# Patient Record
Sex: Male | Born: 1935 | Race: White | Hispanic: No | Smoking: Former smoker
Health system: Southern US, Community
[De-identification: ages and names within clinical notes are randomized; demographics above are authoritative.]

## PROBLEM LIST (undated history)

## (undated) DIAGNOSIS — K222 Esophageal obstruction: Secondary | ICD-10-CM

## (undated) DIAGNOSIS — R42 Dizziness and giddiness: Secondary | ICD-10-CM

## (undated) DIAGNOSIS — I10 Essential (primary) hypertension: Secondary | ICD-10-CM

## (undated) DIAGNOSIS — M1990 Unspecified osteoarthritis, unspecified site: Secondary | ICD-10-CM

## (undated) DIAGNOSIS — E669 Obesity, unspecified: Secondary | ICD-10-CM

## (undated) DIAGNOSIS — K259 Gastric ulcer, unspecified as acute or chronic, without hemorrhage or perforation: Secondary | ICD-10-CM

## (undated) DIAGNOSIS — M109 Gout, unspecified: Secondary | ICD-10-CM

## (undated) DIAGNOSIS — I350 Nonrheumatic aortic (valve) stenosis: Secondary | ICD-10-CM

## (undated) DIAGNOSIS — K5790 Diverticulosis of intestine, part unspecified, without perforation or abscess without bleeding: Secondary | ICD-10-CM

## (undated) DIAGNOSIS — Z9989 Dependence on other enabling machines and devices: Secondary | ICD-10-CM

## (undated) DIAGNOSIS — E119 Type 2 diabetes mellitus without complications: Secondary | ICD-10-CM

## (undated) DIAGNOSIS — Z9289 Personal history of other medical treatment: Secondary | ICD-10-CM

## (undated) DIAGNOSIS — R0600 Dyspnea, unspecified: Secondary | ICD-10-CM

## (undated) DIAGNOSIS — I4819 Other persistent atrial fibrillation: Secondary | ICD-10-CM

## (undated) DIAGNOSIS — E785 Hyperlipidemia, unspecified: Secondary | ICD-10-CM

## (undated) DIAGNOSIS — K219 Gastro-esophageal reflux disease without esophagitis: Secondary | ICD-10-CM

## (undated) DIAGNOSIS — R001 Bradycardia, unspecified: Secondary | ICD-10-CM

## (undated) DIAGNOSIS — G4733 Obstructive sleep apnea (adult) (pediatric): Secondary | ICD-10-CM

## (undated) DIAGNOSIS — N183 Chronic kidney disease, stage 3 unspecified: Secondary | ICD-10-CM

## (undated) DIAGNOSIS — R0601 Orthopnea: Secondary | ICD-10-CM

## (undated) DIAGNOSIS — J189 Pneumonia, unspecified organism: Secondary | ICD-10-CM

## (undated) DIAGNOSIS — I2510 Atherosclerotic heart disease of native coronary artery without angina pectoris: Secondary | ICD-10-CM

## (undated) DIAGNOSIS — C801 Malignant (primary) neoplasm, unspecified: Secondary | ICD-10-CM

## (undated) HISTORY — DX: Gastro-esophageal reflux disease without esophagitis: K21.9

## (undated) HISTORY — DX: Obesity, unspecified: E66.9

## (undated) HISTORY — DX: Diverticulosis of intestine, part unspecified, without perforation or abscess without bleeding: K57.90

## (undated) HISTORY — DX: Nonrheumatic aortic (valve) stenosis: I35.0

## (undated) HISTORY — DX: Type 2 diabetes mellitus without complications: E11.9

## (undated) HISTORY — DX: Esophageal obstruction: K22.2

## (undated) HISTORY — PX: CATARACT EXTRACTION W/ INTRAOCULAR LENS  IMPLANT, BILATERAL: SHX1307

## (undated) HISTORY — DX: Atherosclerotic heart disease of native coronary artery without angina pectoris: I25.10

## (undated) HISTORY — DX: Hyperlipidemia, unspecified: E78.5

## (undated) HISTORY — DX: Gastric ulcer, unspecified as acute or chronic, without hemorrhage or perforation: K25.9

## (undated) HISTORY — DX: Dizziness and giddiness: R42

## (undated) HISTORY — DX: Gout, unspecified: M10.9

## (undated) HISTORY — DX: Essential (primary) hypertension: I10

## (undated) HISTORY — DX: Dyspnea, unspecified: R06.00

## (undated) HISTORY — DX: Orthopnea: R06.01

---

## 1964-02-14 DIAGNOSIS — Z9289 Personal history of other medical treatment: Secondary | ICD-10-CM

## 1964-02-14 HISTORY — PX: FINGER SURGERY: SHX640

## 1964-02-14 HISTORY — DX: Personal history of other medical treatment: Z92.89

## 1997-12-03 ENCOUNTER — Inpatient Hospital Stay (HOSPITAL_COMMUNITY): Admission: EM | Admit: 1997-12-03 | Discharge: 1997-12-03 | Payer: Self-pay

## 1997-12-24 ENCOUNTER — Encounter: Payer: Self-pay | Admitting: Gastroenterology

## 1997-12-24 ENCOUNTER — Ambulatory Visit (HOSPITAL_COMMUNITY): Admission: RE | Admit: 1997-12-24 | Discharge: 1997-12-24 | Payer: Self-pay | Admitting: Gastroenterology

## 1998-04-30 ENCOUNTER — Ambulatory Visit (HOSPITAL_COMMUNITY): Admission: RE | Admit: 1998-04-30 | Discharge: 1998-04-30 | Payer: Self-pay | Admitting: Family Medicine

## 2002-10-13 ENCOUNTER — Encounter: Payer: Self-pay | Admitting: Family Medicine

## 2002-10-13 ENCOUNTER — Encounter: Admission: RE | Admit: 2002-10-13 | Discharge: 2002-10-13 | Payer: Self-pay | Admitting: Family Medicine

## 2003-12-17 ENCOUNTER — Encounter: Admission: RE | Admit: 2003-12-17 | Discharge: 2003-12-17 | Payer: Self-pay | Admitting: Family Medicine

## 2004-10-19 ENCOUNTER — Emergency Department (HOSPITAL_COMMUNITY): Admission: EM | Admit: 2004-10-19 | Discharge: 2004-10-19 | Payer: Self-pay | Admitting: Emergency Medicine

## 2006-03-16 HISTORY — PX: SHOULDER ARTHROSCOPY W/ ROTATOR CUFF REPAIR: SHX2400

## 2006-04-07 ENCOUNTER — Encounter: Admission: RE | Admit: 2006-04-07 | Discharge: 2006-04-07 | Payer: Self-pay | Admitting: Orthopedic Surgery

## 2006-04-13 ENCOUNTER — Ambulatory Visit (HOSPITAL_BASED_OUTPATIENT_CLINIC_OR_DEPARTMENT_OTHER): Admission: RE | Admit: 2006-04-13 | Discharge: 2006-04-13 | Payer: Self-pay | Admitting: Orthopedic Surgery

## 2006-04-24 ENCOUNTER — Emergency Department (HOSPITAL_COMMUNITY): Admission: EM | Admit: 2006-04-24 | Discharge: 2006-04-24 | Payer: Self-pay | Admitting: Family Medicine

## 2006-04-26 ENCOUNTER — Inpatient Hospital Stay (HOSPITAL_COMMUNITY): Admission: EM | Admit: 2006-04-26 | Discharge: 2006-05-01 | Payer: Self-pay | Admitting: Emergency Medicine

## 2006-04-26 ENCOUNTER — Ambulatory Visit: Payer: Self-pay | Admitting: Cardiovascular Disease

## 2006-04-27 ENCOUNTER — Encounter (INDEPENDENT_AMBULATORY_CARE_PROVIDER_SITE_OTHER): Payer: Self-pay | Admitting: Cardiology

## 2006-04-30 ENCOUNTER — Ambulatory Visit: Payer: Self-pay | Admitting: Internal Medicine

## 2006-05-03 ENCOUNTER — Ambulatory Visit: Payer: Self-pay | Admitting: Internal Medicine

## 2006-05-07 ENCOUNTER — Ambulatory Visit: Payer: Self-pay | Admitting: Internal Medicine

## 2006-05-14 ENCOUNTER — Ambulatory Visit: Payer: Self-pay | Admitting: Internal Medicine

## 2006-05-14 LAB — CONVERTED CEMR LAB: Prothrombin Time: 26.8 s — ABNORMAL HIGH (ref 10.0–14.0)

## 2006-05-19 ENCOUNTER — Ambulatory Visit: Payer: Self-pay | Admitting: Cardiovascular Disease

## 2006-05-26 ENCOUNTER — Ambulatory Visit: Payer: Self-pay | Admitting: Cardiology

## 2006-05-29 ENCOUNTER — Ambulatory Visit: Payer: Self-pay | Admitting: Cardiovascular Disease

## 2006-06-02 ENCOUNTER — Ambulatory Visit: Payer: Self-pay | Admitting: Cardiovascular Disease

## 2006-06-17 ENCOUNTER — Ambulatory Visit: Payer: Self-pay | Admitting: Cardiovascular Disease

## 2006-06-17 ENCOUNTER — Ambulatory Visit: Payer: Self-pay | Admitting: Cardiology

## 2006-08-24 ENCOUNTER — Ambulatory Visit: Payer: Self-pay | Admitting: Cardiology

## 2006-12-21 ENCOUNTER — Ambulatory Visit: Payer: Self-pay | Admitting: Cardiovascular Disease

## 2007-04-05 ENCOUNTER — Encounter: Admission: RE | Admit: 2007-04-05 | Discharge: 2007-04-05 | Payer: Self-pay | Admitting: Family Medicine

## 2007-04-20 ENCOUNTER — Ambulatory Visit: Payer: Self-pay | Admitting: Gastroenterology

## 2007-05-04 ENCOUNTER — Ambulatory Visit: Payer: Self-pay | Admitting: Gastroenterology

## 2007-05-04 ENCOUNTER — Encounter: Payer: Self-pay | Admitting: Family Medicine

## 2007-08-05 ENCOUNTER — Ambulatory Visit: Payer: Self-pay | Admitting: Cardiovascular Disease

## 2008-07-14 HISTORY — PX: CARDIAC CATHETERIZATION: SHX172

## 2008-07-31 ENCOUNTER — Inpatient Hospital Stay (HOSPITAL_COMMUNITY): Admission: EM | Admit: 2008-07-31 | Discharge: 2008-08-01 | Payer: Self-pay | Admitting: Emergency Medicine

## 2008-07-31 ENCOUNTER — Encounter: Payer: Self-pay | Admitting: Cardiology

## 2008-07-31 ENCOUNTER — Telehealth: Payer: Self-pay | Admitting: Nurse Practitioner

## 2008-07-31 ENCOUNTER — Ambulatory Visit: Payer: Self-pay | Admitting: Cardiovascular Disease

## 2008-08-15 DIAGNOSIS — E669 Obesity, unspecified: Secondary | ICD-10-CM | POA: Insufficient documentation

## 2008-08-15 DIAGNOSIS — I2510 Atherosclerotic heart disease of native coronary artery without angina pectoris: Secondary | ICD-10-CM | POA: Insufficient documentation

## 2008-08-15 DIAGNOSIS — M109 Gout, unspecified: Secondary | ICD-10-CM | POA: Insufficient documentation

## 2008-08-15 DIAGNOSIS — I10 Essential (primary) hypertension: Secondary | ICD-10-CM | POA: Insufficient documentation

## 2008-08-15 DIAGNOSIS — R0609 Other forms of dyspnea: Secondary | ICD-10-CM | POA: Insufficient documentation

## 2008-08-15 DIAGNOSIS — E7800 Pure hypercholesterolemia, unspecified: Secondary | ICD-10-CM | POA: Insufficient documentation

## 2008-08-15 DIAGNOSIS — R0601 Orthopnea: Secondary | ICD-10-CM | POA: Insufficient documentation

## 2008-08-15 DIAGNOSIS — K219 Gastro-esophageal reflux disease without esophagitis: Secondary | ICD-10-CM | POA: Insufficient documentation

## 2008-08-15 DIAGNOSIS — I25118 Atherosclerotic heart disease of native coronary artery with other forms of angina pectoris: Secondary | ICD-10-CM | POA: Insufficient documentation

## 2008-08-15 DIAGNOSIS — R0989 Other specified symptoms and signs involving the circulatory and respiratory systems: Secondary | ICD-10-CM | POA: Insufficient documentation

## 2008-08-16 ENCOUNTER — Ambulatory Visit: Payer: Self-pay | Admitting: Cardiovascular Disease

## 2008-08-18 ENCOUNTER — Telehealth: Payer: Self-pay | Admitting: Adult Health

## 2008-08-20 ENCOUNTER — Telehealth (INDEPENDENT_AMBULATORY_CARE_PROVIDER_SITE_OTHER): Payer: Self-pay

## 2008-08-20 ENCOUNTER — Telehealth: Payer: Self-pay | Admitting: Cardiovascular Disease

## 2008-08-21 ENCOUNTER — Encounter: Payer: Self-pay | Admitting: Cardiovascular Disease

## 2008-09-01 ENCOUNTER — Ambulatory Visit: Payer: Self-pay | Admitting: Cardiology

## 2008-09-01 ENCOUNTER — Inpatient Hospital Stay (HOSPITAL_COMMUNITY): Admission: EM | Admit: 2008-09-01 | Discharge: 2008-09-02 | Payer: Self-pay | Admitting: Emergency Medicine

## 2008-09-01 ENCOUNTER — Telehealth (INDEPENDENT_AMBULATORY_CARE_PROVIDER_SITE_OTHER): Payer: Self-pay | Admitting: Physician Assistant

## 2008-09-03 ENCOUNTER — Ambulatory Visit: Payer: Self-pay | Admitting: Cardiovascular Disease

## 2008-09-12 ENCOUNTER — Ambulatory Visit: Payer: Self-pay | Admitting: Cardiovascular Disease

## 2008-09-19 ENCOUNTER — Ambulatory Visit: Payer: Self-pay | Admitting: Cardiovascular Disease

## 2008-09-19 ENCOUNTER — Encounter: Payer: Self-pay | Admitting: Physician Assistant

## 2008-09-19 DIAGNOSIS — R42 Dizziness and giddiness: Secondary | ICD-10-CM | POA: Insufficient documentation

## 2008-09-19 LAB — CONVERTED CEMR LAB
POC INR: 1.2
Prothrombin Time: 13.5 s

## 2008-09-26 ENCOUNTER — Ambulatory Visit: Payer: Self-pay | Admitting: Internal Medicine

## 2008-10-10 ENCOUNTER — Ambulatory Visit: Payer: Self-pay | Admitting: Cardiovascular Disease

## 2008-10-10 LAB — CONVERTED CEMR LAB
POC INR: 2.5
Prothrombin Time: 19.3 s

## 2008-10-29 ENCOUNTER — Ambulatory Visit: Payer: Self-pay | Admitting: Cardiovascular Disease

## 2008-10-31 ENCOUNTER — Ambulatory Visit: Payer: Self-pay | Admitting: Internal Medicine

## 2008-11-28 ENCOUNTER — Ambulatory Visit: Payer: Self-pay | Admitting: Cardiology

## 2008-11-28 LAB — CONVERTED CEMR LAB: POC INR: 2.3

## 2008-12-26 ENCOUNTER — Ambulatory Visit: Payer: Self-pay | Admitting: Cardiology

## 2009-01-23 ENCOUNTER — Ambulatory Visit: Payer: Self-pay | Admitting: Internal Medicine

## 2009-01-23 LAB — CONVERTED CEMR LAB: POC INR: 2.4

## 2009-02-18 ENCOUNTER — Ambulatory Visit: Payer: Self-pay | Admitting: Cardiovascular Disease

## 2009-02-20 ENCOUNTER — Encounter (INDEPENDENT_AMBULATORY_CARE_PROVIDER_SITE_OTHER): Payer: Self-pay | Admitting: Cardiology

## 2009-02-20 ENCOUNTER — Ambulatory Visit: Payer: Self-pay | Admitting: Cardiology

## 2009-02-20 LAB — CONVERTED CEMR LAB: POC INR: 1.9

## 2009-03-25 ENCOUNTER — Ambulatory Visit: Payer: Self-pay | Admitting: Internal Medicine

## 2009-03-25 LAB — CONVERTED CEMR LAB: POC INR: 1.7

## 2009-03-27 LAB — CONVERTED CEMR LAB
BUN: 13 mg/dL (ref 6–23)
Creatinine, Ser: 1.4 mg/dL (ref 0.4–1.5)
Potassium: 3.8 meq/L (ref 3.5–5.1)

## 2009-04-08 ENCOUNTER — Ambulatory Visit: Payer: Self-pay | Admitting: Internal Medicine

## 2009-04-08 LAB — CONVERTED CEMR LAB: POC INR: 2.2

## 2009-04-29 ENCOUNTER — Ambulatory Visit: Payer: Self-pay | Admitting: Internal Medicine

## 2009-04-29 LAB — CONVERTED CEMR LAB: POC INR: 2.1

## 2009-05-27 ENCOUNTER — Ambulatory Visit: Payer: Self-pay | Admitting: Cardiology

## 2009-06-18 ENCOUNTER — Ambulatory Visit: Payer: Self-pay | Admitting: Cardiology

## 2009-07-10 ENCOUNTER — Ambulatory Visit: Payer: Self-pay | Admitting: Cardiovascular Disease

## 2009-07-24 ENCOUNTER — Ambulatory Visit: Payer: Self-pay | Admitting: Cardiology

## 2009-08-14 ENCOUNTER — Ambulatory Visit: Payer: Self-pay | Admitting: Cardiovascular Disease

## 2009-08-14 ENCOUNTER — Ambulatory Visit: Payer: Self-pay | Admitting: Cardiology

## 2009-08-14 LAB — CONVERTED CEMR LAB: POC INR: 2.7

## 2009-08-22 ENCOUNTER — Ambulatory Visit: Payer: Self-pay | Admitting: Cardiovascular Disease

## 2009-08-23 ENCOUNTER — Telehealth: Payer: Self-pay | Admitting: Cardiovascular Disease

## 2009-08-26 LAB — CONVERTED CEMR LAB
ALT: 22 units/L (ref 0–53)
AST: 25 units/L (ref 0–37)
Albumin: 4.3 g/dL (ref 3.5–5.2)
Alkaline Phosphatase: 97 units/L (ref 39–117)
Cholesterol: 219 mg/dL — ABNORMAL HIGH (ref 0–200)
Total Bilirubin: 0.6 mg/dL (ref 0.3–1.2)
Total CHOL/HDL Ratio: 7

## 2009-09-09 ENCOUNTER — Ambulatory Visit: Payer: Self-pay | Admitting: Cardiology

## 2009-09-13 ENCOUNTER — Telehealth: Payer: Self-pay | Admitting: Cardiovascular Disease

## 2009-09-13 HISTORY — PX: OTHER SURGICAL HISTORY: SHX169

## 2009-09-19 ENCOUNTER — Ambulatory Visit: Payer: Self-pay | Admitting: Family Medicine

## 2009-09-19 LAB — HM COLONOSCOPY

## 2009-09-19 LAB — CONVERTED CEMR LAB

## 2009-09-22 ENCOUNTER — Inpatient Hospital Stay (HOSPITAL_COMMUNITY): Admission: EM | Admit: 2009-09-22 | Discharge: 2009-09-23 | Payer: Self-pay | Admitting: Emergency Medicine

## 2009-09-22 ENCOUNTER — Ambulatory Visit: Payer: Self-pay | Admitting: Cardiology

## 2009-09-23 ENCOUNTER — Ambulatory Visit: Payer: Self-pay | Admitting: Vascular Surgery

## 2009-09-23 ENCOUNTER — Encounter (INDEPENDENT_AMBULATORY_CARE_PROVIDER_SITE_OTHER): Payer: Self-pay | Admitting: Internal Medicine

## 2009-09-25 ENCOUNTER — Telehealth: Payer: Self-pay | Admitting: Family Medicine

## 2009-09-27 ENCOUNTER — Ambulatory Visit: Payer: Self-pay | Admitting: Family Medicine

## 2009-10-03 ENCOUNTER — Encounter: Admission: RE | Admit: 2009-10-03 | Discharge: 2009-10-15 | Payer: Self-pay | Admitting: Family Medicine

## 2009-10-10 ENCOUNTER — Ambulatory Visit: Payer: Self-pay | Admitting: Cardiology

## 2009-10-10 LAB — CONVERTED CEMR LAB: POC INR: 3.7

## 2009-10-16 ENCOUNTER — Encounter: Payer: Self-pay | Admitting: Family Medicine

## 2009-11-01 ENCOUNTER — Ambulatory Visit: Payer: Self-pay | Admitting: Cardiology

## 2009-11-01 LAB — CONVERTED CEMR LAB: POC INR: 2.6

## 2009-11-22 ENCOUNTER — Ambulatory Visit: Payer: Self-pay | Admitting: Family Medicine

## 2009-11-29 ENCOUNTER — Ambulatory Visit: Payer: Self-pay | Admitting: Internal Medicine

## 2009-11-29 LAB — CONVERTED CEMR LAB: POC INR: 3.1

## 2009-12-20 ENCOUNTER — Ambulatory Visit: Payer: Self-pay | Admitting: Cardiology

## 2009-12-20 LAB — CONVERTED CEMR LAB: POC INR: 2.1

## 2010-01-17 ENCOUNTER — Ambulatory Visit: Payer: Self-pay | Admitting: Cardiology

## 2010-01-17 LAB — CONVERTED CEMR LAB: POC INR: 2.6

## 2010-02-14 ENCOUNTER — Ambulatory Visit: Payer: Self-pay | Admitting: Cardiology

## 2010-02-19 ENCOUNTER — Encounter: Payer: Self-pay | Admitting: Cardiovascular Disease

## 2010-02-19 ENCOUNTER — Ambulatory Visit: Payer: Self-pay | Admitting: Cardiovascular Disease

## 2010-03-14 ENCOUNTER — Ambulatory Visit: Payer: Self-pay | Admitting: Cardiology

## 2010-04-15 NOTE — Assessment & Plan Note (Signed)
Summary: ??spider bite/alc   Vital Signs:  Patient profile:   77 year old male Height:      66 inches Weight:      195 pounds BMI:     31.59 Temp:     97.9 degrees F oral Pulse rate:   92 / minute Pulse rhythm:   regular BP sitting:   136 / 72  (left arm) Cuff size:   regular  Vitals Entered By: Christena Deem CMA [name]) (November 22, 2009 10:29 AM) CC: ? spider bite or gout.    2.  Check right wrist.   History of Present Illness: H/o gout in mult toes.  R3rd toe with erythema and pain for a bout 1 weeks.  pain is some better.  No FCNAV.  No trauma.   Welcome about 1 month ago.  Was doing well until some soreness returned about 1 week ago.  Some pain in R wrist that radiates up the arm.  R handed.  Continues to work around the home.  Not disabled due to pain.    Allergies: 1)  ! Penicillin  Past History:  Past Medical History: Last updated: 09/27/2009 Current Problems:  ATRIAL FIBRILLATION, PAROXYSMAL, HX OF (ICD-V12.50) HYPERTENSION (ICD-401.9) CAD (ICD-414.00)-Nonobstructive HYPERCHOLESTEROLEMIA (ICD-272.0) GASTROESOPHAGEAL REFLUX DISEASE (ICD-530.81) GOUT, UNSPECIFIED (ICD-274.9) OBESITY (ICD-278.00) PAROXYSMAL NOCTURNAL DYSPNEA (ICD-786.09) ORTHOPNEA (ICD-786.02) Dyslipidemia Diverticulosis  Vertigo 2011 [facility]- Sleep Study per Dr. Brett Fairy Carotid dopplers 09/2009 no sig extracranial stenosis and vertebral arteries had antegrade flow MRI of brain 09/2009, chronic microvascular ischemia  Review of Systems       See HPI.  Otherwise negative.    Physical Exam  General:  NAD R foot wnl except for 3rd digit with erythema but no fluctuance or much tenderness on exam.  distally nv intact.   R wrist w/o focal tenderness.  No decrease in ROM.  he is pointing to the extensor tendons on the thumb as the prev painful site, but this area doesn't look inflammed today.    Impression & Recommendations:  Problem # 1:  GOUT, UNSPECIFIED (ICD-274.9) I think this is resolving  gout.  No indication of infection.  follow up as needed.  I would not change the allopurinol now.  I d/w patient [id number].  I think his hand is likely strained and should improve.  I would splint if it continues to cause symptoms.  He declined splint today.  His updated medication list for this problem includes:    Allopurinol 300 Mg Tabs (Allopurinol) .[name]... Take 1 tablet by mouth once a day  Complete Medication List: 1)  Lisinopril 20 Mg Tabs (Lisinopril) .... Take one tablet by mouth daily 2)  Prilosec Otc 20 Mg Tbec (Omeprazole magnesium) .... As needed 3)  Allopurinol 300 Mg Tabs (Allopurinol) .... Take 1 tablet by mouth once a day 4)  Diltiazem Hcl Er Beads 120 Mg Xr24h-cap (Diltiazem hcl er beads) .... Take one capsule by mouth daily 5)  Coumadin 4 Mg Tabs (Warfarin sodium) .... Take as directed by coumadin clinic. 6)  Simvastatin 20 Mg Tabs (Simvastatin) .... Hold per md take one tablet by mouth daily at bedtime 7)  Lorazepam 1 Mg Tabs (Lorazepam) .... Take one tablet every 6 hours as needed for vertigo.  Patient Instructions: 1)  Let me know if the spot on your foot isn't getting better of if your wrist pain does't resolve.  We could splint you if needed.  Take care.  Follow up as needed.   Current Allergies (reviewed today): !  PENICILLIN

## 2010-04-15 NOTE — Progress Notes (Signed)
Summary: regarding med for vertigo  Phone Note Call from Patient   Caller: DaughterLawerance Bach  [phone] Summary of Call: Pt was released from [facility] after being dx'd with vertigo.  Daughter thought he was going to be released with antivert but was given script for antivan instead.  She is asking if antivert can be called to [address].  Or was it meant for him to be given antivan instead?  Please advise. Initial call taken by: Marty Heck CMA,  September 25, 2009 4:58 PM  Follow-up for Phone Call        Per the d/c summary- "The patient has been       prescribed Ativan in addition to meclizine and instructions to       follow up with his primary care physician regarding possible       vestibular rehab in the area. "  have the patient take the ativan along with the meclizine and call if not improving.  We can get a referral done for balance training if needed.  Follow-up by: Elsie Stain MD,  September 25, 2009 5:09 PM  Additional Follow-up for Phone Call Additional follow up Details #1::        Daughter advised.  She states she will contact her Dad and have him make the appointment to follow up with Dr. Damita Dunnings. Additional Follow-up by: Christena Deem CMA Deborra Medina),  September 26, 2009 8:39 AM

## 2010-04-15 NOTE — Medication Information (Signed)
Summary: ccr/jss  Anticoagulant Therapy  Managed by: Freddrick March, RN, BSN Referring MD: Sherren Mocha, MD PCP: Dr Osborne Casco Supervising MD: Harrington Challenger MD, Nevin Bloodgood Indication 1: atrial Fibrillation  (427.31) Lab Used: [facility] [place] Site: [place] INR POC 1.7 INR RANGE 2.0 - 3.0  Dietary changes: no    Health status changes: no    Bleeding/hemorrhagic complications: no    Recent/future hospitalizations: no    Any changes in medication regimen? no    Recent/future dental: no  Any missed doses?: no       Is patient compliant with meds? yes       Allergies (verified): 1)  ! Penicillin  Anticoagulation Management History:      The patient is taking warfarin and comes in today for a routine follow up visit.  Positive risk factors for bleeding include an age of 75 years or older.  The bleeding index is 'intermediate risk'.  Positive CHADS2 values include History of HTN and Age > 75 years old.  His last INR was 4.3 RATIO.  Anticoagulation responsible provider: Harrington Challenger MD, Nevin Bloodgood.  INR POC: 1.7.  Cuvette Lot#: [id number].  Exp: 04-2010.    Anticoagulation Management Assessment/Plan:      The patient's current anticoagulation dose is Coumadin 4 mg tabs: Take as directed by coumadin clinic..  The target INR is 2.0-3.0.  The next INR is due 04/08/2009.  Anticoagulation instructions were given to patient.  Results were reviewed/authorized by Freddrick March, RN, BSN.  He was notified by Freddrick March RN.         Prior Anticoagulation Instructions: INR 1.9  Take additional 0.5 tab today, then resume 1 tab daily except 1.5 tabs on Mondays, Wednesdays, and Fridays.  Recheck in 3 weeks.    Current Anticoagulation Instructions: INR 1.7  Take an extra 1/2 tablet today then start taking 1.5 tablets daily except 1 tablet on Sundays, Tuesdays, and Thursdays.   Recheck in 2 weeks.    Appended Document: ccr/jss    Clinical Lists Changes  Medications: Changed medication  from LISINOPRIL 20 MG TABS (LISINOPRIL) Take one tablet by mouth daily to LISINOPRIL 20 MG TABS (LISINOPRIL) Take one tablet by mouth daily Orders: Added new Test order of TLB-BMP (Basic Metabolic Panel-BMET) (99991111) - Signed

## 2010-04-15 NOTE — Medication Information (Signed)
Summary: rov/mw  Anticoagulant Therapy  Managed by: Porfirio Oar, PharmD Referring MD: Sherren Mocha, MD PCP: Dr Osborne Casco Supervising MD: Stanford Breed MD, Aaron Edelman Indication 1: atrial Fibrillation  (427.31) Lab Used: [facility] [place] Site: [place] INR POC 2.1 INR RANGE 2.0 - 3.0  Dietary changes: no    Health status changes: no    Bleeding/hemorrhagic complications: no    Recent/future hospitalizations: no    Any changes in medication regimen? no    Recent/future dental: no  Any missed doses?: no       Is patient compliant with meds? yes       Allergies: 1)  ! Penicillin  Anticoagulation Management History:      Positive risk factors for bleeding include an age of 31 years or older.  The bleeding index is 'intermediate risk'.  Positive CHADS2 values include History of HTN and Age > 23 years old.  His last INR was 4.3 RATIO.  Anticoagulation responsible provider: Stanford Breed MD, Aaron Edelman.  INR POC: 2.1.  Cuvette Lot#: [id number].  Exp: 01/2011.    Anticoagulation Management Assessment/Plan:      The patient's current anticoagulation dose is Coumadin 4 mg tabs: Take as directed by coumadin clinic..  The target INR is 2.0-3.0.  The next INR is due 01/17/2010.  Anticoagulation instructions were given to patient.  Results were reviewed/authorized by Porfirio Oar, PharmD.  He was notified by Griffith Citron D candidate.         Prior Anticoagulation Instructions: INR 3.1 Tomorrow take 1 tablet instead of 1 and a half tablets as a one time saturday dose since you've taken your dose today already. Keep other days the same. Sunday 1 tablet, monday 1.5 tablets, tuesday 1.5 tablets, wednesday 1.5 tablets, thursday 1 tablet, friday 1.5 tablets. And then go back to saturday at 1.5 tablets.  Current Anticoagulation Instructions: INR 2.1  Continue takine 1 1/2 tablet everyday except 1 tablet on Sunday and Thursday. Recheck in 4 weeks.

## 2010-04-15 NOTE — Medication Information (Signed)
Summary: rov/cb  Anticoagulant Therapy  Managed by: Freddrick March, RN, BSN Referring MD: Sherren Mocha, MD PCP: Dr Osborne Casco Supervising MD: Haroldine Laws MD, Quillian Quince Indication 1: atrial Fibrillation  (427.31) Lab Used: [facility] [place] Site: [place] INR POC 1.5 INR RANGE 2.0 - 3.0  Dietary changes: no    Health status changes: no    Bleeding/hemorrhagic complications: no    Recent/future hospitalizations: no    Any changes in medication regimen? no    Recent/future dental: yes  Any missed doses?: yes     Details: Off coumadin x 3 days for dental extraction approx 1 week ago.  Is patient compliant with meds? yes       Allergies: 1)  ! Penicillin  Anticoagulation Management History:      The patient is taking warfarin and comes in today for a routine follow up visit.  Positive risk factors for bleeding include an age of 75 years or older.  The bleeding index is 'intermediate risk'.  Positive CHADS2 values include History of HTN and Age > 75 years old.  His last INR was 4.3 RATIO.  Anticoagulation responsible provider: Bensimhon MD, Quillian Quince.  INR POC: 1.5.  Cuvette Lot#: [id number].  Exp: 08/2010.    Anticoagulation Management Assessment/Plan:      The patient's current anticoagulation dose is Coumadin 4 mg tabs: Take as directed by coumadin clinic..  The target INR is 2.0-3.0.  The next INR is due 07/24/2009.  Anticoagulation instructions were given to patient.  Results were reviewed/authorized by Freddrick March, RN, BSN.  He was notified by Freddrick March RN.         Prior Anticoagulation Instructions: INR 1.8. Take an extra tablet today, then take 1.5 tablets daily except 1 tablet on Tues, Thurs, Sun.  Current Anticoagulation Instructions: INR 1.5  Take an extra 1/2 tablet today, start taking 1.5 tablets daily except 1 tablet on Sundays and Tuesdays.  Recheck in 2 weeks.

## 2010-04-15 NOTE — Medication Information (Signed)
Summary: rov/tm  Anticoagulant Therapy  Managed by: Margaretha Sheffield, PharmD Referring MD: Sherren Mocha, MD PCP: Dr Osborne Casco Supervising MD: Ron Parker MD, Dellis Filbert Indication 1: atrial Fibrillation  (427.31) Lab Used: [facility] [place] Site: [place] INR POC 1.7 INR RANGE 2.0 - 3.0  Dietary changes: no    Health status changes: no    Bleeding/hemorrhagic complications: no    Recent/future hospitalizations: no    Any changes in medication regimen? no    Recent/future dental: no  Any missed doses?: no       Is patient compliant with meds? yes       Allergies: 1)  ! Penicillin  Anticoagulation Management History:      The patient is taking warfarin and comes in today for a routine follow up visit.  Positive risk factors for bleeding include an age of 75 years or older.  The bleeding index is 'intermediate risk'.  Positive CHADS2 values include History of HTN and Age > 75 years old.  His last INR was 4.3 RATIO.  Anticoagulation responsible provider: Ron Parker MD, Dellis Filbert.  INR POC: 1.7.  Cuvette Lot#: [id number].  Exp: 07-2010.    Anticoagulation Management Assessment/Plan:      The patient's current anticoagulation dose is Coumadin 4 mg tabs: Take as directed by coumadin clinic..  The target INR is 2.0-3.0.  The next INR is due 06/17/2009.  Anticoagulation instructions were given to patient.  Results were reviewed/authorized by Margaretha Sheffield, PharmD.  He was notified by Margaretha Sheffield.         Prior Anticoagulation Instructions: INR 2.1 Continue 6mg  everyday except 4mg s on Sundays, Tuesdays, and Thursdays. Recheck in 4 weeks   Current Anticoagulation Instructions: INR 1.7  Take 2 tablets today.  Then return to normal dosing schedule of 1 tablet on Sunday, Tuesday, and Thursday, and 1.5 tablets all other days.  Return to clinic in 3 weeks.

## 2010-04-15 NOTE — Medication Information (Signed)
Summary: ccr. gd  Anticoagulant Therapy  Managed by: Porfirio Oar, PharmD Referring MD: Sherren Mocha, MD PCP: Dr Osborne Casco Supervising MD: Stanford Breed MD, Aaron Edelman Indication 1: atrial Fibrillation  (427.31) Lab Used: [facility] [place] Site: [place] INR POC 3.7 INR RANGE 2.0 - 3.0  Dietary changes: yes       Details: less vitamin k intake this week.   Health status changes: no    Bleeding/hemorrhagic complications: no    Recent/future hospitalizations: yes       Details: went to the hospital about 1 1/2 weeks ago after getting dizzy.  Diagnosed with vertigo  Any changes in medication regimen? no    Recent/future dental: no  Any missed doses?: no       Is patient compliant with meds? yes       Allergies: 1)  ! Penicillin  Anticoagulation Management History:      The patient is taking warfarin and comes in today for a routine follow up visit.  Positive risk factors for bleeding include an age of 75 years or older.  The bleeding index is 'intermediate risk'.  Positive CHADS2 values include History of HTN and Age > 40 years old.  His last INR was 4.3 RATIO.  Anticoagulation responsible provider: Stanford Breed MD, Aaron Edelman.  INR POC: 3.7.  Cuvette Lot#: [id number].  Exp: 12/2010.    Anticoagulation Management Assessment/Plan:      The patient's current anticoagulation dose is Coumadin 4 mg tabs: Take as directed by coumadin clinic..  The target INR is 2.0-3.0.  The next INR is due 10/31/2009.  Anticoagulation instructions were given to patient.  Results were reviewed/authorized by Porfirio Oar, PharmD.  He was notified by Porfirio Oar PharmD.         Prior Anticoagulation Instructions: INR 2.5 Continue 6mg s daily except 4mg s on Sundays and Thursdays. Recheck in 4 weeks.   Current Anticoagulation Instructions: INR 3.7  Skip Friday's dose of Coumadin, take 1/2 tablet on Saturday then resume same dose of 1 1/2 tablets every day except 1 tablet on Sunday and Thursday.

## 2010-04-15 NOTE — Progress Notes (Signed)
Summary: MEDS  Phone Note Call from Patient Call back at Home Phone [phone]   Caller: Patient [phone] Reason for Call: Talk to Nurse Summary of Call: TO CALL BACK PER DR [place] DECIDED WHAT TO DO ABOUT MED Initial call taken by: Lorenda Hatchet,  August 23, 2009 12:09 PM  Follow-up for Phone Call        Left message to call back. Theodosia Quay, RN, BSN  August 26, 2009 3:42 PM  I spoke with the pt's wife and she said the pt is going to stay on coumadin because Pradaxa has to many side effects.   Follow-up by: Theodosia Quay, RN, BSN,  August 27, 2009 12:18 PM

## 2010-04-15 NOTE — Medication Information (Signed)
Summary: rov/tm  Anticoagulant Therapy  Managed by: Gwynneth Albright, PharmD Referring MD: Sherren Mocha, MD PCP: Dr Osborne Casco Supervising MD: Ron Parker MD, Dellis Filbert Indication 1: atrial Fibrillation  (427.31) Lab Used: [facility] [place] Site: [place] INR POC 2.7 INR RANGE 2.0 - 3.0  Dietary changes: no    Health status changes: no    Bleeding/hemorrhagic complications: no    Recent/future hospitalizations: no    Any changes in medication regimen? no    Recent/future dental: no  Any missed doses?: no       Is patient compliant with meds? yes       Allergies: 1)  ! Penicillin  Anticoagulation Management History:      The patient is taking warfarin and comes in today for a routine follow up visit.  Positive risk factors for bleeding include an age of 75 years or older.  The bleeding index is 'intermediate risk'.  Positive CHADS2 values include History of HTN and Age > 46 years old.  His last INR was 4.3 RATIO.  Anticoagulation responsible provider: Ron Parker MD, Dellis Filbert.  INR POC: 2.7.  Cuvette Lot#: [id number].  Exp: 10/2010.    Anticoagulation Management Assessment/Plan:      The patient's current anticoagulation dose is Coumadin 4 mg tabs: Take as directed by coumadin clinic..  The target INR is 2.0-3.0.  The next INR is due 09/09/2009.  Anticoagulation instructions were given to patient.  Results were reviewed/authorized by Gwynneth Albright, PharmD.  He was notified by Gwynneth Albright, PharmD.         Prior Anticoagulation Instructions: INR 2.3 Continue 6mg  everyday except 4mg s on Sundays and Thursdays. Recheck in 3 weeks   Current Anticoagulation Instructions: INR 2.7. Take 1.5 tablets daily except 1 tablet on Sun and Thurs.

## 2010-04-15 NOTE — Progress Notes (Signed)
Summary: Reaction to medication  Phone Note Call from Patient Call back at [phone]   Caller: Daughter/Tabaitha Summary of Call: Question about medication pt having reaction to Simvastatin dizziness,thoat swollen Initial call taken by: Delsa Sale,  September 13, 2009 1:21 PM  Follow-up for Phone Call        I spoke with the pt's daughter and the pt has been having problems with Simvastatin.  The pt has had stiff sore muscles since starting Simvastatin.  Over the past week the pt developed throat swelling, difficulty swallowing, nausea, vomiting and dizziness.  The pt did not take Simvastatin today or yesterday and his symptom these days was dizziness. Per the pt's daughter she thinks the pt's BP today was 123/72.  I advised her to have the pt stop taking Simvastatin.  The pt can use Benadryl as needed for symptoms.  I instructed her to call the office next week to let us know how the pt is feeling off of Simvastatin.  I will forward this information to Dr Burt Knack for review.  (FYI this pt takes Diltiazem) Follow-up by: Theodosia Quay, RN, BSN,  September 13, 2009 2:14 PM  Additional Follow-up for Phone Call Additional follow up Details #1::        agreed Additional Follow-up by: Neale Burly, MD,  September 13, 2009 4:48 PM

## 2010-04-15 NOTE — Medication Information (Signed)
Summary: rov/ej  Anticoagulant Therapy  Managed by: Benjamine Sprague, PharmD Referring MD: Sherren Mocha, MD PCP: Dr Osborne Casco Supervising MD: Ron Parker MD, Dellis Filbert Indication 1: atrial Fibrillation  (427.31) Lab Used: [facility] [place] Site: [place] INR POC 2.4 INR RANGE 2.0 - 3.0  Dietary changes: no    Health status changes: no    Bleeding/hemorrhagic complications: no    Recent/future hospitalizations: no    Any changes in medication regimen? no    Recent/future dental: no  Any missed doses?: no       Is patient compliant with meds? yes       Allergies: 1)  ! Penicillin  Anticoagulation Management History:      Positive risk factors for bleeding include an age of 52 years or older.  The bleeding index is 'intermediate risk'.  Positive CHADS2 values include History of HTN and Age > 24 years old.  His last INR was 4.3 RATIO.  Anticoagulation responsible provider: Ron Parker MD, Dellis Filbert.  INR POC: 2.4.  Exp: 02/2011.    Anticoagulation Management Assessment/Plan:      The patient's current anticoagulation dose is Coumadin 4 mg tabs: Take as directed by coumadin clinic..  The target INR is 2.0-3.0.  The next INR is due 03/14/2010.  Anticoagulation instructions were given to patient.  Results were reviewed/authorized by Benjamine Sprague, PharmD.         Prior Anticoagulation Instructions: INR 2.6  Continue previous dose of 1.5 tablets everyday except 1 tablet on Sunday and Thursday  Recheck INR in 4 weeks.  Current Anticoagulation Instructions: Continue taking coumadin as scheduled with 1 1/2 tablets (6mg ) every day except for 1 tablet (4mg ) on Sundays and Thursdays.  INR=2.4

## 2010-04-15 NOTE — Assessment & Plan Note (Signed)
Summary: [facility] AND STILL DIZZY   Vital Signs:  Patient profile:   77 year old male Height:      66 inches Weight:      194.25 pounds BMI:     31.47 Temp:     97.9 degrees F oral Pulse rate:   68 / minute Pulse rhythm:   regular BP sitting:   120 / 76  (left arm) Cuff size:   regular  Vitals Entered By: Christena Deem CMA Deborra Medina) (September 27, 2009 10:54 AM) CC: Follow-up visit, [place], still dizzy.  Please discuss discharge meds with patient and daughter.  She questions his Rx's.   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  Was at hospital this weekend for a severe episode. "I thought I was having a stroke at the time.  It hit me like a ton of bricks."  Took ativan last night.  Continues to have trouble when supine and roll over or lay on L side.   Hospital recs reviewed.  MRI/CT/carotid dopplers w/o sig abnormality. Cardiac enzymes were normal during admission.  here for follow up and to discuss options.   Allergies: 1)  ! Penicillin  Past History:  Past Medical History: Current Problems:  ATRIAL FIBRILLATION, PAROXYSMAL, HX OF (ICD-V12.50) HYPERTENSION (ICD-401.9) CAD (ICD-414.00)-Nonobstructive HYPERCHOLESTEROLEMIA (ICD-272.0) GASTROESOPHAGEAL REFLUX DISEASE (ICD-530.81) GOUT, UNSPECIFIED (ICD-274.9) OBESITY (ICD-278.00) PAROXYSMAL NOCTURNAL DYSPNEA (ICD-786.09) ORTHOPNEA (ICD-786.02) Dyslipidemia Diverticulosis  Vertigo 2011 [facility]- Sleep Study per Dr. Brett Fairy Carotid dopplers 09/2009 no sig extracranial stenosis and vertebral arteries had antegrade flow MRI of brain 09/2009, chronic microvascular ischemia  Review of Systems       See HPI.  Otherwise noncontributory.    Physical Exam  General:  GEN: nad, alert and oriented HEENT: mucous membranes moist, tm wnl bilaterally NECK: supple w/o LA CV: rrr.  no murmur PULM: ctab, no inc wob ABD: soft, +bs EXT: no edema SKIN: no acute rash  CN 2-12 wnl B, S/S x4, gait at  baseline   Impression & Recommendations:  Problem # 1:  DIZZINESS (ICD-780.4)  Likely BPV.   Refer to vestibular rehab.  Can use lorazepam as needed on top of meclizine for refractory symptoms in the meantime.  Sedation caution.  Patient voluntarily with hold off on driving in the meantime.   Orders: Misc. Referral (Misc. Ref)  Complete Medication List: 1)  Lisinopril 20 Mg Tabs (Lisinopril) .... Take one tablet by mouth daily 2)  Prilosec Otc 20 Mg Tbec (Omeprazole magnesium) .... As needed 3)  Allopurinol 300 Mg Tabs (Allopurinol) .... Take 1 tablet by mouth once a day 4)  Diltiazem Hcl Er Beads 120 Mg Xr24h-cap (Diltiazem hcl er beads) .... Take one capsule by mouth daily 5)  Coumadin 4 Mg Tabs (Warfarin sodium) .... Take as directed by coumadin clinic. 6)  Simvastatin 20 Mg Tabs (Simvastatin) .... Hold per md take one tablet by mouth daily at bedtime 7)  Lorazepam 1 Mg Tabs (Lorazepam) .... Take one tablet every 6 hours as needed for vertigo.  Patient Instructions: 1)  Please schedule a follow-up appointment as needed . See Rosaria Ferries about your referral before your leave today.   Prescriptions: LORAZEPAM 1 MG TABS (LORAZEPAM) Take one tablet every 6 hours as needed for vertigo.  #20 x 0   Entered and Authorized by:   Elsie Stain MD   Signed by:   Elsie Stain MD on 09/27/2009   Method used:   Print then Give to Patient  [id number]   Current Allergies (reviewed today): ! PENICILLIN

## 2010-04-15 NOTE — Medication Information (Signed)
Summary: rov/ewj  Anticoagulant Therapy  Managed by: Tula Nakayama, RN, BSN Referring MD: Sherren Mocha, MD PCP: Dr Osborne Casco Supervising MD: Ron Parker MD, Dellis Filbert Indication 1: atrial Fibrillation  (427.31) Lab Used: [facility] Site: [place] INR POC 2.3 INR RANGE 2.0 - 3.0  Dietary changes: no    Health status changes: no    Bleeding/hemorrhagic complications: no    Recent/future hospitalizations: no    Any changes in medication regimen? no    Recent/future dental: no  Any missed doses?: no       Is patient compliant with meds? yes       Allergies: 1)  ! Penicillin  Anticoagulation Management History:      The patient is taking warfarin and comes in today for a routine follow up visit.  Positive risk factors for bleeding include an age of 75 years or older.  The bleeding index is 'intermediate risk'.  Positive CHADS2 values include History of HTN and Age > 40 years old.  His last INR was 4.3 RATIO.  Anticoagulation responsible provider: Ron Parker MD, Dellis Filbert.  INR POC: 2.3.  Cuvette Lot#: [id number].  Exp: 10/2010.    Anticoagulation Management Assessment/Plan:      The patient's current anticoagulation dose is Coumadin 4 mg tabs: Take as directed by coumadin clinic..  The target INR is 2.0-3.0.  The next INR is due 08/14/2009.  Anticoagulation instructions were given to patient.  Results were reviewed/authorized by Tula Nakayama, RN, BSN.  He was notified by Tula Nakayama, RN, BSN.         Prior Anticoagulation Instructions: INR 1.5  Take an extra 1/2 tablet today, start taking 1.5 tablets daily except 1 tablet on Sundays and Tuesdays.  Recheck in 2 weeks.    Current Anticoagulation Instructions: INR 2.3 Continue 6mg  everyday except 4mg s on Sundays and Thursdays. Recheck in 3 weeks

## 2010-04-15 NOTE — Medication Information (Signed)
Summary: rov/sp  Anticoagulant Therapy  Managed by: Freddrick March, RN, BSN Referring MD: Sherren Mocha, MD PCP: Dr Osborne Casco Supervising MD: Aundra Dubin MD, Kirk Ruths Indication 1: atrial Fibrillation  (427.31) Lab Used: [facility] [place] Site: [place] INR POC 2.6 INR RANGE 2.0 - 3.0  Dietary changes: no    Health status changes: no    Bleeding/hemorrhagic complications: no    Recent/future hospitalizations: no    Any changes in medication regimen? no    Recent/future dental: no  Any missed doses?: no       Is patient compliant with meds? yes       Allergies: 1)  ! Penicillin  Anticoagulation Management History:      The patient is taking warfarin and comes in today for a routine follow up visit.  Positive risk factors for bleeding include an age of 75 years or older.  The bleeding index is 'intermediate risk'.  Positive CHADS2 values include History of HTN and Age > 75 years old.  His last INR was 4.3 RATIO.  Anticoagulation responsible Roberta Kelly: Aundra Dubin MD, [place].  INR POC: 2.6.  Cuvette Lot#: [id number].  Exp: 12/2010.    Anticoagulation Management Assessment/Plan:      The patient's current anticoagulation dose is Coumadin 4 mg tabs: Take as directed by coumadin clinic..  The target INR is 2.0-3.0.  The next INR is due 11/29/2009.  Anticoagulation instructions were given to patient.  Results were reviewed/authorized by Freddrick March, RN, BSN.  He was notified by Freddrick March RN.         Prior Anticoagulation Instructions: INR 3.7  Skip Friday's dose of Coumadin, take 1/2 tablet on Saturday then resume same dose of 1 1/2 tablets every day except 1 tablet on Sunday and Thursday.    Current Anticoagulation Instructions: INR 2.6  Continue on same dosage 1.5 tablets daily except 1 tablet on Sundays and Thursdays.  Recheck in 4 weeks.

## 2010-04-15 NOTE — Medication Information (Signed)
Summary: rov/cb  Anticoagulant Therapy  Managed by: Tula Nakayama, RN, BSN Referring MD: Sherren Mocha, MD PCP: Dr Osborne Casco Supervising MD: Aundra Dubin MD, Kirk Ruths Indication 1: atrial Fibrillation  (427.31) Lab Used: [facility] [place] Site: [place] INR POC 2.5 INR RANGE 2.0 - 3.0  Dietary changes: no    Health status changes: no    Bleeding/hemorrhagic complications: no    Recent/future hospitalizations: no    Any changes in medication regimen? yes       Details: Started Approx 2 weeks. ago Simvastatin   Recent/future dental: no  Any missed doses?: no       Is patient compliant with meds? yes       Allergies: 1)  ! Penicillin  Anticoagulation Management History:      The patient is taking warfarin and comes in today for a routine follow up visit.  Positive risk factors for bleeding include an age of 20 years or older.  The bleeding index is 'intermediate risk'.  Positive CHADS2 values include History of HTN and Age > 48 years old.  His last INR was 4.3 RATIO.  Anticoagulation responsible provider: Aundra Dubin MD, Hila Bolding.  INR POC: 2.5.  Cuvette Lot#: [id number].  Exp: 10/2010.    Anticoagulation Management Assessment/Plan:      The patient's current anticoagulation dose is Coumadin 4 mg tabs: Take as directed by coumadin clinic..  The target INR is 2.0-3.0.  The next INR is due 10/07/2009.  Anticoagulation instructions were given to patient.  Results were reviewed/authorized by Tula Nakayama, RN, BSN.  He was notified by Tula Nakayama, RN, BSN.         Prior Anticoagulation Instructions: INR 2.7. Take 1.5 tablets daily except 1 tablet on Sun and Thurs.  Current Anticoagulation Instructions: INR 2.5 Continue 6mg s daily except 4mg s on Sundays and Thursdays. Recheck in 4 weeks.

## 2010-04-15 NOTE — Medication Information (Signed)
Summary: rov/eac  Anticoagulant Therapy  Managed by: Gwynneth Albright, PharmD Referring MD: Sherren Mocha, MD PCP: Dr Osborne Casco Supervising MD: Caryl Comes MD, Remo Lipps Indication 1: atrial Fibrillation  (427.31) Lab Used: [facility] Site: [place] INR POC 1.8 INR RANGE 2.0 - 3.0  Dietary changes: yes       Details: Patient has eaten a few more salads and cabbage.  Health status changes: no    Bleeding/hemorrhagic complications: no    Recent/future hospitalizations: no    Any changes in medication regimen? no    Recent/future dental: no  Any missed doses?: yes     Details: Missed dose last Friday.  Is patient compliant with meds? yes       Allergies: 1)  ! Penicillin  Anticoagulation Management History:      The patient is taking warfarin and comes in today for a routine follow up visit.  Positive risk factors for bleeding include an age of 75 years or older.  The bleeding index is 'intermediate risk'.  Positive CHADS2 values include History of HTN and Age > 40 years old.  His last INR was 4.3 RATIO.  Anticoagulation responsible provider: Caryl Comes MD, Remo Lipps.  INR POC: 1.8.  Cuvette Lot#: [id number].  Exp: 07-2010.    Anticoagulation Management Assessment/Plan:      The patient's current anticoagulation dose is Coumadin 4 mg tabs: Take as directed by coumadin clinic..  The target INR is 2.0-3.0.  The next INR is due 07/09/2009.  Anticoagulation instructions were given to patient.  Results were reviewed/authorized by Gwynneth Albright, PharmD.  He was notified by Gwynneth Albright, PharmD.         Prior Anticoagulation Instructions: INR 1.7  Take 2 tablets today.  Then return to normal dosing schedule of 1 tablet on Sunday, Tuesday, and Thursday, and 1.5 tablets all other days.  Return to clinic in 3 weeks.     Current Anticoagulation Instructions: INR 1.8. Take an extra tablet today, then take 1.5 tablets daily except 1 tablet on Tues, Thurs, Sun.

## 2010-04-15 NOTE — Assessment & Plan Note (Signed)
Summary: [name]   Visit Type:  Follow-up Primary Provider:  Dr. Elsie Stain  CC:  50 month ROV.  History of Present Illness: This is a 77 year old gentleman with paroxysmal atrial fibrillation and nonobstructive coronary artery disease presenting for followup evaluation. He is doing well at present. He has had some problems with positional vertigo but has been treated by Dr Damita Dunnings and these symptoms have resolved. He denies chest pain, dyspnea, edema, or other complaints at present. He is tolerating meds without problems.  Current Medications (verified): 1)  Lisinopril 20 Mg Tabs (Lisinopril) .... Take One Tablet By Mouth Daily 2)  Prilosec Otc 20 Mg Tbec (Omeprazole Magnesium) .... As Needed 3)  Allopurinol 300 Mg Tabs (Allopurinol) .... Take 1 Tablet By Mouth Once A Day 4)  Diltiazem Hcl Er Beads 120 Mg Xr24h-Cap (Diltiazem Hcl Er Beads) .... Take One Capsule By Mouth Daily 5)  Coumadin 4 Mg Tabs (Warfarin Sodium) .... Take As Directed By Coumadin Clinic. 6)  Lorazepam 1 Mg Tabs (Lorazepam) .... Take One Tablet Every 6 Hours As Needed For Vertigo. 7)  Metoprolol Tartrate 25 Mg Tabs (Metoprolol Tartrate) .... Take One Half Tablet By Mouth Once A Day  Allergies: 1)  ! Penicillin  Past History:  Past medical history reviewed for relevance to current acute and chronic problems.  Past Medical History: Reviewed history from 09/27/2009 and no changes required. Current Problems:  ATRIAL FIBRILLATION, PAROXYSMAL, HX OF (ICD-V12.50) HYPERTENSION (ICD-401.9) CAD (ICD-414.00)-Nonobstructive HYPERCHOLESTEROLEMIA (ICD-272.0) GASTROESOPHAGEAL REFLUX DISEASE (ICD-530.81) GOUT, UNSPECIFIED (ICD-274.9) OBESITY (ICD-278.00) PAROXYSMAL NOCTURNAL DYSPNEA (ICD-786.09) ORTHOPNEA (ICD-786.02) Dyslipidemia Diverticulosis  Vertigo 2011 [facility]- Sleep Study per Dr. Brett Fairy Carotid dopplers 09/2009 no sig extracranial stenosis and vertebral arteries had antegrade flow MRI of brain 09/2009, chronic  microvascular ischemia  Review of Systems       Positive for foot pain and numbness with walking, otherwise negative except as per HPI  Vital Signs:  Patient profile:   78 year old male Height:      66 inches Weight:      199.50 pounds Pulse rate:   61 / minute Resp:     16 per minute BP sitting:   148 / 78  (left arm) Cuff size:   regular  Vitals Entered By: Eliezer Lofts, EMT-P (February 19, 2010 12:17 PM)  Physical Exam  General:  Pt is alert and oriented, in no acute distress. HEENT: normal Neck: normal carotid upstrokes without bruits, JVP normal Lungs: CTA CV: RRR without murmur or gallop Abd: soft, NT, positive BS, obese Ext: no clubbing, cyanosis, or edema. peripheral pulses 2+ and equal Skin: warm and dry without rash    EKG  Procedure date:  02/19/2010  Findings:      NSR with a single PAC, otherwise within normal limits, HR 63 bpm.   Impression & Recommendations:  Problem # 1:  ATRIAL FIBRILLATION (ICD-427.31) Maintaining sinus rhythm. Continue anticoagulation with warfarin. Increase metoprolol to 25 mg two times a day.  His updated medication list for this problem includes:    Coumadin 4 Mg Tabs (Warfarin sodium) .[name]... Take as directed by coumadin clinic.    Metoprolol Tartrate 25 Mg Tabs (Metoprolol tartrate) .[name]... Take one  tablet by mouth twice a day  Problem # 2:  HYPERTENSION (ICD-401.9) Reviewed home BP's and they are mildly elevated with systolic average [id number]. Increase metoprolol to 25 mg two times a day.  His updated medication list for this problem includes:    Lisinopril 20 Mg Tabs (Lisinopril) .[name]... Take  one tablet by mouth daily    Diltiazem Hcl Er Beads 120 Mg Xr24h-cap (Diltiazem hcl er beads) .[name]... Take one capsule by mouth daily    Metoprolol Tartrate 25 Mg Tabs (Metoprolol tartrate) .[name]... Take one  tablet by mouth twice a day  BP today: 148/78 Prior BP: 136/72 (11/22/2009)  Labs Reviewed: K+: 3.8 (03/25/2009) Creat: : 1.4  (03/25/2009)   Chol: 219 (08/14/2009)   HDL: 31.70 (08/14/2009)   TG: 387.0 (08/14/2009)  Other Orders: EKG w/ Interpretation (93000)  Patient Instructions: 1)  Your physician has recommended you make the following change in your medication: INCREASE Metoprolol to 25mg  two times a day  2)  Your physician wants you to follow-up in:   6 MONTHS. You will receive a reminder letter in the mail two months in advance. If you don't receive a letter, please call our office to schedule the follow-up appointment. Prescriptions: METOPROLOL TARTRATE 25 MG TABS (METOPROLOL TARTRATE) Take one  tablet by mouth twice a day  #60 x 8   Entered by:   Theodosia Quay, RN, BSN   Authorized by:   Neale Burly, MD   Signed by:   Theodosia Quay, RN, BSN on 02/19/2010   Method used:   Elect[facility] to        [id number]       [address]       [place], [place]  [address]       Ph: [phone]       Fax: [phone]   RxID:   [id number]

## 2010-04-15 NOTE — Assessment & Plan Note (Signed)
Summary: f84m   Visit Type:  Follow-up  CC:  check up.  History of Present Illness: This is a 77 year old gentleman with paroxysmal atrial fibrillation and nonobstructive coronary artery disease, presenting today for followup evaluation. He is doing well at present. He denies chest pain, dyspnea, or edema. No palpitations or syncope. He is in transition with his PCP and is planning on establishing at the [place] office. No complaints today.  Current Medications (verified): 1)  Lisinopril 20 Mg Tabs (Lisinopril) .... Take One Tablet By Mouth Daily 2)  Prilosec Otc 20 Mg Tbec (Omeprazole Magnesium) .... As Needed 3)  Allopurinol 300 Mg Tabs (Allopurinol) .... Take 1 Tablet By Mouth Once A Day 4)  Diltiazem Hcl Er Beads 120 Mg Xr24h-Cap (Diltiazem Hcl Er Beads) .... Take One Capsule By Mouth Daily 5)  Metoprolol Tartrate 25 Mg Tabs (Metoprolol Tartrate) .... 1/2 Tab By Mouth Twice Daily 6)  Coumadin 4 Mg Tabs (Warfarin Sodium) .... Take As Directed By Coumadin Clinic.  Allergies: 1)  ! Penicillin  Past History:  Past Surgical History: Last updated: 08/15/2008 right rotator cuff surgery in 2008.   Past medical history reviewed for relevance to current acute and chronic problems.  Past Medical History: Current Problems:  ATRIAL FIBRILLATION, PAROXYSMAL, HX OF (ICD-V12.50) HYPERTENSION (ICD-401.9) CAD (ICD-414.00)-Nonobstructive HYPERCHOLESTEROLEMIA (ICD-272.0) GASTROESOPHAGEAL REFLUX DISEASE (ICD-530.81) GOUT, UNSPECIFIED (ICD-274.9) OBESITY (ICD-278.00) PAROXYSMAL NOCTURNAL DYSPNEA (ICD-786.09) ORTHOPNEA (ICD-786.02) Dyslipidemia  Review of Systems       Negative except as per HPI   Vital Signs:  Patient profile:   77 year old male Height:      66 inches Weight:      190 pounds BMI:     30.78 Pulse rate:   65 / minute Resp:     14 per minute BP sitting:   145 / 73  (left arm)  Vitals Entered By: Burnett Kanaris (August 22, 2009 12:12 PM)  Physical  Exam  General:  Pt is alert and oriented,obese male, in no acute distress. HEENT: normal Neck: normal carotid upstrokes without bruits, JVP normal Lungs: CTA CV: RRR without murmur or gallop Abd: soft, NT, obese, positive BS, no bruit, no organomegaly Ext: no clubbing, cyanosis, or edema. peripheral pulses 2+ and equal Skin: warm and dry without rash    EKG  Procedure date:  08/22/2009  Findings:      NSR, within normal limits, HR 65 bpm.  Impression & Recommendations:  Problem # 1:  ATRIAL FIBRILLATION (ICD-427.31) Paroxysmal, in sinus rhythm today. He inquired about changing from Coumadin to Pradaxa. We discussed this and will change meds if he can obtain Pradaxa at a reasonable cost. He will contact us so that we can notify the Coumadin Clinic.   The following medications were removed from the medication list:    Metoprolol Tartrate 25 Mg Tabs (Metoprolol tartrate) .[name]... 1/2 tab by mouth twice daily His updated medication list for this problem includes:    Coumadin 4 Mg Tabs (Warfarin sodium) .[name]... Take as directed by coumadin clinic.  Orders: EKG w/ Interpretation (93000)  Problem # 2:  HYPERTENSION (ICD-401.9) Home BP's reviewed and average 130-135/70's. He complains of cold feet after taking metoprolol and he would like to discontinue this medication. He is on a very low dose and I advised he could discontinue this.  The following medications were removed from the medication list:    Metoprolol Tartrate 25 Mg Tabs (Metoprolol tartrate) .[name]... 1/2 tab by mouth twice daily His updated medication list  for this problem includes:    Lisinopril 20 Mg Tabs (Lisinopril) .[name]... Take one tablet by mouth daily    Diltiazem Hcl Er Beads 120 Mg Xr24h-cap (Diltiazem hcl er beads) .[name]... Take one capsule by mouth daily  Orders: EKG w/ Interpretation (93000)  Problem # 3:  HYPERCHOLESTEROLEMIA (ICD-272.0) Lipids elevated, predominately in a low-HDL/high Trig pattern. Start  Simvastatin 20 mg. F/U lipids/LFT's 8 weeks.  His updated medication list for this problem includes:    Simvastatin 20 Mg Tabs (Simvastatin) .[name]... Take one tablet by mouth daily at bedtime  CHOL: 219 (08/14/2009)   HDL: 31.70 (08/14/2009)   TG: 387.0 (08/14/2009)  Patient Instructions: 1)  Your physician recommends that you return for a FASTING LIPID and LIVER Profile in 3 MONTHS (427.31, 272.0)  2)  Your physician has recommended you make the following change in your medication: START Simvastatin 20mg  one tablet by mouth at bedtime, STOP Metoprolol, Prescription for Pradaxa given to patient--The patient will address cost of medication and  call the office if he wants to start this medication 3)  Your physician wants you to follow-up in:  6 MONTHS.  You will receive a reminder letter in the mail two months in advance. If you don't receive a letter, please call our office to schedule the follow-up appointment. Prescriptions: SIMVASTATIN 20 MG TABS (SIMVASTATIN) Take one tablet by mouth daily at bedtime  #30 x 6   Entered by:   Theodosia Quay, RN, BSN   Authorized by:   Neale Burly, MD   Signed by:   Theodosia Quay, RN, BSN on 08/22/2009   Method used:   [facility]        [id number]       [address]       [place], [place]  [address]       Ph: [phone]       Fax: [phone]   [id number] PRADAXA 150 MG CAPS (DABIGATRAN ETEXILATE MESYLATE) take one capsule by mouth two times a day  #60 x 6   Entered by:   Theodosia Quay, RN, BSN   Authorized by:   Neale Burly, MD   Signed by:   Theodosia Quay, RN, BSN on 08/22/2009   Method used:   Print then Give to Patient   RxID:   [id number]

## 2010-04-15 NOTE — Assessment & Plan Note (Signed)
Summary: new patient/rbh   Vital Signs:  Patient profile:   77 year old male Height:      66 inches Weight:      192 pounds Temp:     97.8 degrees F oral Pulse rate:   68 / minute Pulse rhythm:   regular BP sitting:   132 / 66  (left arm) Cuff size:   large  Vitals Entered By: Christena Deem CMA Deborra Medina) (September 19, 2009 1:54 PM) CC: New Patient to Establish, Preventive Care   History of Present Illness: Yearly physical- see prev med.   Hypertension:      Using medication without problems or lightheadedness: no syncopal symptoms.   Chest pain with exertion: no Edema:no Short of breath:no  Average home BPs: 120s/60-70s Other issues:  Elevated Cholesterol: Using medications without problems: see below Muscle aches:  no Other complaints: episode of vertigo when he rolled over in bed.  the was about the same time he started the simvastatin.  He d/w Dr. Burt Knack and this med has been held in the meantime.  Now only with sudden changes in position.   AF- followed by cards.  INR per cards.    Allergies: 1)  ! Penicillin  Past History:  Past Medical History: Current Problems:  ATRIAL FIBRILLATION, PAROXYSMAL, HX OF (ICD-V12.50) HYPERTENSION (ICD-401.9) CAD (ICD-414.00)-Nonobstructive HYPERCHOLESTEROLEMIA (ICD-272.0) GASTROESOPHAGEAL REFLUX DISEASE (ICD-530.81) GOUT, UNSPECIFIED (ICD-274.9) OBESITY (ICD-278.00) PAROXYSMAL NOCTURNAL DYSPNEA (ICD-786.09) ORTHOPNEA (ICD-786.02) Dyslipidemia Diverticulosis  Vertigo 2011 [facility]- Sleep Study per Dr. Brett Fairy  Family History:  Mother died in her 69s of stomach cancer.  Father died   of old age at 30 with PNA, likely with dementia.  Also with HTN.  He has a brother who is status post CABG and 4  sisters who are alive and well.     Family History Hypertension  Social History:   [place] lives in [place] with his wife.  He is a retired   Administrator, although now Audiological scientist.  He previously smoked a  pack a week for about 4  years but quit this in 1971.  He quit all  alcohol in the 70s.  Denies drug use.  He is not routinely exercising.  Review of Systems       See HPI.  Otherwise noncontributory.    Physical Exam  General:  GEN: nad, alert and oriented HEENT: mucous membranes moist NECK: supple w/o LA CV: rrr.  no murmur PULM: ctab, no inc wob ABD: soft, +bs EXT: no edema SKIN: no acute rash  Rectal: stool heme neg and prostate w/o asymmetry or nodularity CN 2-12 wnl B, S/S/DTR wnl x4, DHP mildlly + for vertigo symptoms    Impression & Recommendations:  Problem # 1:  Preventive Health Care (ICD-V70.0) PNA and tetanus vaccine along with zostavax today.  Other items reviewed and up up to date through other MDs.   Problem # 2:  Screening PSA (ICD-V76.44) Contact with labs.   Problem # 3:  DIZZINESS (ICD-780.4) Likely BPH. d/w patient.  No need for further work up now.  Can use OTC agents as needed and follow up as needed.   Complete Medication List: 1)  Lisinopril 20 Mg Tabs (Lisinopril) .... Take one tablet by mouth daily 2)  Prilosec Otc 20 Mg Tbec (Omeprazole magnesium) .... As needed 3)  Allopurinol 300 Mg Tabs (Allopurinol) .... Take 1 tablet by mouth once a day 4)  Diltiazem Hcl Er Beads 120 Mg Xr24h-cap (Diltiazem hcl er beads) .... Take one  capsule by mouth daily 5)  Coumadin 4 Mg Tabs (Warfarin sodium) .... Take as directed by coumadin clinic. 6)  Simvastatin 20 Mg Tabs (Simvastatin) .... Hold per md take one tablet by mouth daily at bedtime  Other Orders: TLB-PSA (Prostate Specific Antigen) (84153-PSA) Tdap => 57yrs IM [id number]) Pneumococcal Vaccine [id number]) Admin 1st Vaccine [id number]) Admin of Any Addtl Vaccine [id number]) Zoster (Shingles) Vaccine Live [id number])  Colorectal Screening:  Current Recommendations:    Hemoccult: NEG X 1 today  Colonoscopy Results:    Date of Exam: 05/04/2007    Results: Diverticulosis  Next Colonoscopy Due:    05/03/2017  PSA Screening:    Reviewed  PSA screening recommendations: PSA ordered  Immunization & Chemoprophylaxis:    Tetanus vaccine: Tdap  (09/19/2009)    Pneumovax: Pneumovax (Medicare)  (09/19/2009)  Patient Instructions: 1)  Please schedule a follow-up appointment as needed .  2)  Use over the counter meclizine (or antivert) if the dizzy sensations continue.  It should get better gradually.  Let me know if it gets worse.   Current Allergies (reviewed today): ! PENICILLIN    Immunizations Administered:  Tetanus Vaccine:    Vaccine Type: Tdap    Site: left deltoid    Mfr: GlaxoSmithKline    Dose: 0.5 ml    Route: IM    Given by: Christena Deem CMA ([employer])    Exp. Date: 06/07/2011    Lot #: [id number]    VIS given: 02/01/07 version given September 19, 2009.  Pneumonia Vaccine:    Vaccine Type: Pneumovax (Medicare)    Site: left deltoid    Mfr: Merck    Dose: 0.5 ml    Route: IM    Given by: Christena Deem CMA ([place])    Exp. Date: 03/15/2011    Lot #: [id number]    VIS given: 10/12/95 version given September 19, 2009.  Zostavax # 1:    Vaccine Type: Zostavax    Site: Left arm    Mfr: Merck    Dose: 0.5 ml    Route: [place]    Given by: Christena Deem CMA ([place])    Exp. Date: 10/09/2010    Lot #: [id number]    VIS given: 12/26/04 given September 19, 2009.

## 2010-04-15 NOTE — Miscellaneous (Signed)
Summary: PT Initial Summary/[facility]   Imported By: Edmonia James 10/24/2009 13:34:14  _____________________________________________________________________  External Attachment:    Type:   Image     Comment:   External Document

## 2010-04-15 NOTE — Medication Information (Signed)
Summary: rov/ez  Anticoagulant Therapy  Managed by: Tula Nakayama, RN, BSN Referring MD: Sherren Mocha, MD PCP: Dr Osborne Casco Supervising MD: Haroldine Laws MD, Quillian Quince Indication 1: atrial Fibrillation  (427.31) Lab Used: [facility] Site: [place] INR POC 2.1 INR RANGE 2.0 - 3.0  Dietary changes: no    Health status changes: no    Bleeding/hemorrhagic complications: no    Recent/future hospitalizations: no    Any changes in medication regimen? no    Recent/future dental: no  Any missed doses?: no       Is patient compliant with meds? yes       Allergies: 1)  ! Penicillin  Anticoagulation Management History:      The patient is taking warfarin and comes in today for a routine follow up visit.  Positive risk factors for bleeding include an age of 75 years or older.  The bleeding index is 'intermediate risk'.  Positive CHADS2 values include History of HTN and Age > 48 years old.  His last INR was 4.3 RATIO.  Anticoagulation responsible provider: Jyla Hopf MD, Quillian Quince.  INR POC: 2.1.  Cuvette Lot#: [id number].  Exp: 04-2010.    Anticoagulation Management Assessment/Plan:      The patient's current anticoagulation dose is Coumadin 4 mg tabs: Take as directed by coumadin clinic..  The target INR is 2.0-3.0.  The next INR is due 05/27/2009.  Anticoagulation instructions were given to patient.  Results were reviewed/authorized by Tula Nakayama, RN, BSN.  He was notified by Tula Nakayama, RN, BSN.         Prior Anticoagulation Instructions: INR: 2.2 Continue with same dosage of 1.5 tablets every day except 1 tablet on Sundays, Tuesdays and Thursdays Recheck in 3 weeks  Current Anticoagulation Instructions: INR 2.1 Continue 6mg  everyday except 4mg s on Sundays, Tuesdays, and Thursdays. Recheck in 4 weeks

## 2010-04-15 NOTE — Medication Information (Signed)
Summary: rov/sel  Anticoagulant Therapy  Managed by: Porfirio Oar, PharmD Referring MD: Sherren Mocha, MD PCP: Dr Osborne Casco Supervising MD: Ron Parker MD, Dellis Filbert Indication 1: atrial Fibrillation  (427.31) Lab Used: [facility] Site: [place] INR POC 2.6 INR RANGE 2.0 - 3.0  Dietary changes: no    Health status changes: no    Bleeding/hemorrhagic complications: no    Recent/future hospitalizations: no    Any changes in medication regimen? no    Recent/future dental: no  Any missed doses?: no       Is patient compliant with meds? yes       Allergies (verified): 1)  ! Penicillin  Anticoagulation Management History:      The patient is taking warfarin and comes in today for a routine follow up visit.  Positive risk factors for bleeding include an age of 10 years or older.  The bleeding index is 'intermediate risk'.  Positive CHADS2 values include History of HTN and Age > 58 years old.  His last INR was 4.3 RATIO.  Anticoagulation responsible provider: Ron Parker MD, Dellis Filbert.  INR POC: 2.6.  Cuvette Lot#: [id number].  Exp: 02/2011.    Anticoagulation Management Assessment/Plan:      The patient's current anticoagulation dose is Coumadin 4 mg tabs: Take as directed by coumadin clinic..  The target INR is 2.0-3.0.  The next INR is due 02/14/2010.  Anticoagulation instructions were given to patient.  Results were reviewed/authorized by Porfirio Oar, PharmD.  He was notified by Carmin Richmond, PharmD Candidate.         Prior Anticoagulation Instructions: INR 2.1  Continue takine 1 1/2 tablet everyday except 1 tablet on Sunday and Thursday. Recheck in 4 weeks.   Current Anticoagulation Instructions: INR 2.6  Continue previous dose of 1.5 tablets everyday except 1 tablet on Sunday and Thursday  Recheck INR in 4 weeks.

## 2010-04-15 NOTE — Procedures (Signed)
Summary: Colonoscopy Report/[facility]  Colonoscopy Report/[facility]   Imported By: Laural Benes 09/24/2009 11:15:15  _____________________________________________________________________  External Attachment:    Type:   Image     Comment:   External Document

## 2010-04-15 NOTE — Medication Information (Signed)
Summary: rov/ewj  Anticoagulant Therapy  Managed by: Mammie Lorenzo, PharmD Referring MD: Sherren Mocha, MD PCP: Dr Osborne Casco Supervising MD: Harrington Challenger MD,Arek Spadafore Indication 1: atrial Fibrillation  (427.31) Lab Used: [facility] Site: [place] INR POC 3.1 INR RANGE 2.0 - 3.0  Dietary changes: no    Health status changes: no    Bleeding/hemorrhagic complications: no    Recent/future hospitalizations: no    Any changes in medication regimen? no    Recent/future dental: no  Any missed doses?: no       Is patient compliant with meds? yes       Allergies: 1)  ! Penicillin  Anticoagulation Management History:      The patient is taking warfarin and comes in today for a routine follow up visit.  Positive risk factors for bleeding include an age of 71 years or older.  The bleeding index is 'intermediate risk'.  Positive CHADS2 values include History of HTN and Age > 47 years old.  His last INR was 4.3 RATIO.  Anticoagulation responsible provider: Harrington Challenger MD,Ann Groeneveld.  INR POC: 3.1.  Cuvette Lot#: [id number].  Exp: 12/2010.    Anticoagulation Management Assessment/Plan:      The patient's current anticoagulation dose is Coumadin 4 mg tabs: Take as directed by coumadin clinic..  The target INR is 2.0-3.0.  The next INR is due 12/20/2009.  Anticoagulation instructions were given to patient.  Results were reviewed/authorized by Mammie Lorenzo, PharmD.         Prior Anticoagulation Instructions: INR 2.6  Continue on same dosage 1.5 tablets daily except 1 tablet on Sundays and Thursdays.  Recheck in 4 weeks.    Current Anticoagulation Instructions: INR 3.1 Tomorrow take 1 tablet instead of 1 and a half tablets as a one time saturday dose since you've taken your dose today already. Keep other days the same. Sunday 1 tablet, monday 1.5 tablets, tuesday 1.5 tablets, wednesday 1.5 tablets, thursday 1 tablet, friday 1.5 tablets. And then go back to saturday at 1.5 tablets.

## 2010-04-15 NOTE — Medication Information (Signed)
Summary: rov/ewj  Anticoagulant Therapy  Managed by: Alinda Deem, PharmD, BCPS, CPP Referring MD: Sherren Mocha, MD PCP: Dr Osborne Casco Supervising MD: Harrington Challenger MD, Nevin Bloodgood Indication 1: atrial Fibrillation  (427.31) Lab Used: [facility] Site: [place] INR POC 2.2 INR RANGE 2.0 - 3.0  Dietary changes: no    Health status changes: no    Bleeding/hemorrhagic complications: no    Recent/future hospitalizations: no    Any changes in medication regimen? no    Recent/future dental: no  Any missed doses?: no       Is patient compliant with meds? yes       Allergies (verified): 1)  ! Penicillin  Anticoagulation Management History:      The patient is taking warfarin and comes in today for a routine follow up visit.  Positive risk factors for bleeding include an age of 75 years or older.  The bleeding index is 'intermediate risk'.  Positive CHADS2 values include History of HTN and Age > 34 years old.  His last INR was 4.3 RATIO.  Anticoagulation responsible provider: Harrington Challenger MD, Nevin Bloodgood.  INR POC: 2.2.  Cuvette Lot#: [id number].  Exp: 04-2010.    Anticoagulation Management Assessment/Plan:      The patient's current anticoagulation dose is Coumadin 4 mg tabs: Take as directed by coumadin clinic..  The target INR is 2.0-3.0.  The next INR is due 04/29/2009.  Anticoagulation instructions were given to patient.  Results were reviewed/authorized by Alinda Deem, PharmD, BCPS, CPP.  He was notified by Merlyn Albert, Pharm D Candidate.         Prior Anticoagulation Instructions: INR 1.7  Take an extra 1/2 tablet today then start taking 1.5 tablets daily except 1 tablet on Sundays, Tuesdays, and Thursdays.   Recheck in 2 weeks.    Current Anticoagulation Instructions: INR: 2.2 Continue with same dosage of 1.5 tablets every day except 1 tablet on Sundays, Tuesdays and Thursdays Recheck in 3 weeks

## 2010-04-17 NOTE — Medication Information (Signed)
Summary: rov/ej  Anticoagulant Therapy  Managed by: Benjamine Sprague, PharmD Referring MD: Sherren Mocha, MD PCP: Dr. Elsie Stain Supervising MD: Stanford Breed MD,Brian Indication 1: atrial Fibrillation  (427.31) Lab Used: [facility] Site: [place] INR POC 2.2 INR RANGE 2.0 - 3.0  Dietary changes: no    Health status changes: no    Bleeding/hemorrhagic complications: no    Recent/future hospitalizations: no    Any changes in medication regimen? no    Recent/future dental: no  Any missed doses?: no       Is patient compliant with meds? yes       Allergies: 1)  ! Penicillin  Anticoagulation Management History:      The patient is taking warfarin and comes in today for a routine follow up visit.  Positive risk factors for bleeding include an age of 35 years or older.  The bleeding index is 'intermediate risk'.  Positive CHADS2 values include History of HTN and Age > 50 years old.  His last INR was 4.3 RATIO.  Anticoagulation responsible provider: Stanford Breed MD,Brian.  INR POC: 2.2.  Exp: 02/2011.    Anticoagulation Management Assessment/Plan:      The patient's current anticoagulation dose is Coumadin 4 mg tabs: Take as directed by coumadin clinic..  The target INR is 2.0-3.0.  The next INR is due 04/18/2010.  Anticoagulation instructions were given to patient.  Results were reviewed/authorized by Benjamine Sprague, PharmD.         Prior Anticoagulation Instructions: Continue taking coumadin as scheduled with 1 1/2 tablets (6mg ) every day except for 1 tablet (4mg ) on Sundays and Thursdays.  INR=2.4  Current Anticoagulation Instructions: INR 2.2 (INR goal: 2-3)  Continue 1 tablet on Sundays and Thursdays and 1 and 1/2 tablets on Mondays, Tuesdays, Wednesdays, Fridays, and Saturdays.

## 2010-04-18 ENCOUNTER — Encounter: Payer: Self-pay | Admitting: Internal Medicine

## 2010-04-18 ENCOUNTER — Encounter (INDEPENDENT_AMBULATORY_CARE_PROVIDER_SITE_OTHER): Payer: Medicare Other

## 2010-04-18 ENCOUNTER — Ambulatory Visit: Admit: 2010-04-18 | Payer: Self-pay

## 2010-04-18 DIAGNOSIS — I4891 Unspecified atrial fibrillation: Secondary | ICD-10-CM

## 2010-04-18 DIAGNOSIS — Z7901 Long term (current) use of anticoagulants: Secondary | ICD-10-CM

## 2010-04-18 LAB — CONVERTED CEMR LAB: POC INR: 1.3

## 2010-04-23 NOTE — Medication Information (Signed)
Summary: rov/ewj  Anticoagulant Therapy  Managed by: Tula Nakayama, RN, BSN Referring MD: Sherren Mocha, MD PCP: Dr. Elsie Stain Supervising MD: Harrington Challenger MD, Nevin Bloodgood Indication 1: atrial Fibrillation  (427.31) Lab Used: [facility] Site: [place] INR POC 1.3 INR RANGE 2.0 - 3.0  Dietary changes: no    Health status changes: no    Bleeding/hemorrhagic complications: no    Recent/future hospitalizations: no    Any changes in medication regimen? no    Recent/future dental: no  Any missed doses?: no       Is patient compliant with meds? yes       Allergies: 1)  ! Penicillin  Anticoagulation Management History:      The patient is taking warfarin and comes in today for a routine follow up visit.  Positive risk factors for bleeding include an age of 75 years or older.  The bleeding index is 'intermediate risk'.  Positive CHADS2 values include History of HTN and Age > 14 years old.  His last INR was 4.3 RATIO.  Anticoagulation responsible provider: Harrington Challenger MD, Nevin Bloodgood.  INR POC: 1.3.  Cuvette Lot#: [id number].  Exp: 03/2011.    Anticoagulation Management Assessment/Plan:      The patient's current anticoagulation dose is Coumadin 4 mg tabs: Take as directed by coumadin clinic..  The target INR is 2.0-3.0.  The next INR is due 05/02/2010.  Anticoagulation instructions were given to patient.  Results were reviewed/authorized by Tula Nakayama, RN, BSN.  He was notified by Tula Nakayama, RN, BSN.         Prior Anticoagulation Instructions: INR 2.2 (INR goal: 2-3)  Continue 1 tablet on Sundays and Thursdays and 1 and 1/2 tablets on Mondays, Tuesdays, Wednesdays, Fridays, and Saturdays.  Current Anticoagulation Instructions: INR 1.3 Today take extra 1/2 pill and on Saturday take 2 pills then resume    1 1/2 pills everyday except 1 pill on Sundays and Thursdays. Recheck in 2 weeks.

## 2010-05-02 ENCOUNTER — Encounter: Payer: Self-pay | Admitting: Cardiovascular Disease

## 2010-05-02 ENCOUNTER — Encounter (INDEPENDENT_AMBULATORY_CARE_PROVIDER_SITE_OTHER): Payer: Medicare Other

## 2010-05-02 DIAGNOSIS — I4891 Unspecified atrial fibrillation: Secondary | ICD-10-CM

## 2010-05-02 DIAGNOSIS — Z7901 Long term (current) use of anticoagulants: Secondary | ICD-10-CM

## 2010-05-02 LAB — CONVERTED CEMR LAB: POC INR: 2.2

## 2010-05-07 NOTE — Medication Information (Signed)
Summary: rov/kh  Anticoagulant Therapy  Managed by: Tula Nakayama, RN, BSN Referring MD: Sherren Mocha, MD PCP: Dr. Elsie Stain Supervising MD: Mertie Moores MD Indication 1: atrial Fibrillation  (427.31) Lab Used: [facility] Site: [place] INR POC 2.2 INR RANGE 2.0 - 3.0  Dietary changes: no    Health status changes: no    Bleeding/hemorrhagic complications: no    Recent/future hospitalizations: no    Any changes in medication regimen? no    Recent/future dental: no  Any missed doses?: no       Is patient compliant with meds? yes       Allergies: 1)  ! Penicillin  Anticoagulation Management History:      The patient is taking warfarin and comes in today for a routine follow up visit.  Positive risk factors for bleeding include an age of 37 years or older.  The bleeding index is 'intermediate risk'.  Positive CHADS2 values include History of HTN and Age > 31 years old.  His last INR was 4.3 RATIO.  Anticoagulation responsible provider: Mertie Moores MD.  INR POC: 2.2.  Cuvette Lot#: [id number].  Exp: 03/2011.    Anticoagulation Management Assessment/Plan:      The patient's current anticoagulation dose is Coumadin 4 mg tabs: Take as directed by coumadin clinic..  The target INR is 2.0-3.0.  The next INR is due 06/02/2010.  Anticoagulation instructions were given to patient.  Results were reviewed/authorized by Tula Nakayama, RN, BSN.         Prior Anticoagulation Instructions: INR 1.3 Today take extra 1/2 pill and on Saturday take 2 pills then resume    1 1/2 pills everyday except 1 pill on Sundays and Thursdays. Recheck in 2 weeks.   Current Anticoagulation Instructions: INR 2.2  Continue taking 1 and 1/2 tablets everyday except 1 tablet on Sundays and Thursdays. Recheck INR in 4 weeks.

## 2010-05-20 ENCOUNTER — Encounter: Payer: Self-pay | Admitting: Cardiovascular Disease

## 2010-05-20 DIAGNOSIS — I4891 Unspecified atrial fibrillation: Secondary | ICD-10-CM

## 2010-06-01 LAB — DIFFERENTIAL
Basophils Relative: 1 % (ref 0–1)
Monocytes Absolute: 0.5 10*3/uL (ref 0.1–1.0)
Monocytes Relative: 8 % (ref 3–12)
Neutro Abs: 4.8 10*3/uL (ref 1.7–7.7)

## 2010-06-01 LAB — POCT I-STAT, CHEM 8
BUN: 15 mg/dL (ref 6–23)
Creatinine, Ser: 1.2 mg/dL (ref 0.4–1.5)
Potassium: 4.3 mEq/L (ref 3.5–5.1)
Sodium: 137 mEq/L (ref 135–145)

## 2010-06-01 LAB — URINALYSIS, ROUTINE W REFLEX MICROSCOPIC
Protein, ur: NEGATIVE mg/dL
Urobilinogen, UA: 0.2 mg/dL (ref 0.0–1.0)

## 2010-06-01 LAB — POCT CARDIAC MARKERS
CKMB, poc: 1 ng/mL (ref 1.0–8.0)
Myoglobin, poc: 71.9 ng/mL (ref 12–200)
Troponin i, poc: <0.05 ng/mL (ref 0.00–0.09)

## 2010-06-01 LAB — CBC
Hemoglobin: 15.3 g/dL (ref 13.0–17.0)
MCH: 30.6 pg (ref 26.0–34.0)
Platelets: 169 10*3/uL (ref 150–400)
Platelets: 172 10*3/uL (ref 150–400)
RBC: 4.82 MIL/uL (ref 4.22–5.81)
RBC: 4.93 MIL/uL (ref 4.22–5.81)
RDW: 13.6 % (ref 11.5–15.5)
WBC: 5.7 10*3/uL (ref 4.0–10.5)
WBC: 6.6 10*3/uL (ref 4.0–10.5)

## 2010-06-01 LAB — PROTIME-INR
INR: 3.05 — ABNORMAL HIGH (ref 0.00–1.49)
Prothrombin Time: 31.3 seconds — ABNORMAL HIGH (ref 11.6–15.2)

## 2010-06-01 LAB — COMPREHENSIVE METABOLIC PANEL
AST: 21 U/L (ref 0–37)
BUN: 12 mg/dL (ref 6–23)
CO2: 25 mEq/L (ref 19–32)
Calcium: 8.8 mg/dL (ref 8.4–10.5)
Creatinine, Ser: 1.1 mg/dL (ref 0.4–1.5)
GFR calc Af Amer: >60 mL/min (ref >60)
GFR calc non Af Amer: >60 mL/min (ref >60)

## 2010-06-01 LAB — CK TOTAL AND CKMB (NOT AT ARMC)
CK, MB: 1.1 ng/mL (ref 0.3–4.0)
CK, MB: 1.1 ng/mL (ref 0.3–4.0)
CK, MB: 1.5 ng/mL (ref 0.3–4.0)
Relative Index: INVALID (ref 0.0–2.5)

## 2010-06-01 LAB — LIPID PANEL
HDL: 28 mg/dL — ABNORMAL LOW (ref >39)
LDL Cholesterol: 105 mg/dL — ABNORMAL HIGH (ref 0–99)
Triglycerides: 243 mg/dL — ABNORMAL HIGH (ref <150)

## 2010-06-01 LAB — GLUCOSE, CAPILLARY: Glucose-Capillary: 112 mg/dL — ABNORMAL HIGH (ref 70–99)

## 2010-06-01 LAB — TROPONIN I
Troponin I: 0.01 ng/mL (ref 0.00–0.06)
Troponin I: 0.01 ng/mL (ref 0.00–0.06)
Troponin I: 0.01 ng/mL (ref 0.00–0.06)

## 2010-06-01 LAB — URINE CULTURE

## 2010-06-02 ENCOUNTER — Encounter (INDEPENDENT_AMBULATORY_CARE_PROVIDER_SITE_OTHER): Payer: Medicare Other

## 2010-06-02 ENCOUNTER — Encounter: Payer: Self-pay | Admitting: Cardiology

## 2010-06-02 DIAGNOSIS — Z7901 Long term (current) use of anticoagulants: Secondary | ICD-10-CM

## 2010-06-02 DIAGNOSIS — I4891 Unspecified atrial fibrillation: Secondary | ICD-10-CM

## 2010-06-02 LAB — CONVERTED CEMR LAB: POC INR: 2.4

## 2010-06-12 NOTE — Medication Information (Signed)
Summary: rov/sp  Anticoagulant Therapy  Managed by: Freddrick March, RN, BSN Referring MD: Sherren Mocha, MD PCP: Dr. Elsie Stain Supervising MD: Stanford Breed MD, Aaron Edelman Indication 1: atrial Fibrillation  (427.31) Lab Used: [facility] Site: [place] INR POC 2.4 INR RANGE 2.0 - 3.0  Dietary changes: yes       Details: May have eaten a few more greens.   Health status changes: no    Bleeding/hemorrhagic complications: no    Recent/future hospitalizations: no    Any changes in medication regimen? no    Recent/future dental: no  Any missed doses?: no       Is patient compliant with meds? yes       Allergies: 1)  ! Penicillin  Anticoagulation Management History:      The patient is taking warfarin and comes in today for a routine follow up visit.  Positive risk factors for bleeding include an age of 10 years or older.  The bleeding index is 'intermediate risk'.  Positive CHADS2 values include History of HTN and Age > 81 years old.  His last INR was 4.3 RATIO.  Anticoagulation responsible provider: Stanford Breed MD, Aaron Edelman.  INR POC: 2.4.  Cuvette Lot#: [id number].  Exp: 05/2011.    Anticoagulation Management Assessment/Plan:      The patient's current anticoagulation dose is Coumadin 4 mg tabs: Take as directed by coumadin clinic..  The target INR is 2.0-3.0.  The next INR is due 07/01/2010.  Anticoagulation instructions were given to patient.  Results were reviewed/authorized by Freddrick March, RN, BSN.  He was notified by Freddrick March RN.         Prior Anticoagulation Instructions: INR 2.2  Continue taking 1 and 1/2 tablets everyday except 1 tablet on Sundays and Thursdays. Recheck INR in 4 weeks.   Current Anticoagulation Instructions: INR 2.4  Continue on same dosage 1.5 tablets daily except 1 tablet on Sundays and Thursdays.  Recheck in 4 weeks.

## 2010-06-20 ENCOUNTER — Other Ambulatory Visit: Payer: Self-pay | Admitting: Cardiovascular Disease

## 2010-06-23 LAB — DIFFERENTIAL
Basophils Absolute: 0 10*3/uL (ref 0.0–0.1)
Eosinophils Absolute: 0.1 10*3/uL (ref 0.0–0.7)
Eosinophils Relative: 2 % (ref 0–5)
Lymphocytes Relative: 17 % (ref 12–46)
Lymphs Abs: 1.2 10*3/uL (ref 0.7–4.0)
Neutrophils Relative %: 72 % (ref 43–77)

## 2010-06-23 LAB — URINALYSIS, ROUTINE W REFLEX MICROSCOPIC
Bilirubin Urine: NEGATIVE
Ketones, ur: NEGATIVE mg/dL
Nitrite: NEGATIVE
Protein, ur: NEGATIVE mg/dL
Urobilinogen, UA: 0.2 mg/dL (ref 0.0–1.0)
pH: 7 (ref 5.0–8.0)

## 2010-06-23 LAB — CBC
HCT: 46.2 % (ref 39.0–52.0)
Platelets: 201 10*3/uL (ref 150–400)
RDW: 13.4 % (ref 11.5–15.5)
WBC: 7 10*3/uL (ref 4.0–10.5)

## 2010-06-23 LAB — BASIC METABOLIC PANEL
BUN: 28 mg/dL — ABNORMAL HIGH (ref 6–23)
Creatinine, Ser: 1.38 mg/dL (ref 0.4–1.5)
GFR calc non Af Amer: 50 mL/min — ABNORMAL LOW (ref >60)
GFR calc non Af Amer: 55 mL/min — ABNORMAL LOW (ref >60)
Glucose, Bld: 100 mg/dL — ABNORMAL HIGH (ref 70–99)
Glucose, Bld: 100 mg/dL — ABNORMAL HIGH (ref 70–99)
Potassium: 5 mEq/L (ref 3.5–5.1)
Sodium: 137 mEq/L (ref 135–145)

## 2010-06-23 LAB — POCT CARDIAC MARKERS: Myoglobin, poc: 79.4 ng/mL (ref 12–200)

## 2010-06-23 LAB — PROTIME-INR
INR: 1 (ref 0.00–1.49)
Prothrombin Time: 13 seconds (ref 11.6–15.2)

## 2010-06-24 LAB — COMPREHENSIVE METABOLIC PANEL
Albumin: 3.9 g/dL (ref 3.5–5.2)
Alkaline Phosphatase: 92 U/L (ref 39–117)
BUN: 15 mg/dL (ref 6–23)
Chloride: 96 mEq/L (ref 96–112)
Creatinine, Ser: 1.22 mg/dL (ref 0.4–1.5)
Glucose, Bld: 108 mg/dL — ABNORMAL HIGH (ref 70–99)
Total Bilirubin: 0.6 mg/dL (ref 0.3–1.2)

## 2010-06-24 LAB — CBC
HCT: 46 % (ref 39.0–52.0)
Hemoglobin: 15.9 g/dL (ref 13.0–17.0)
RBC: 5.13 MIL/uL (ref 4.22–5.81)
WBC: 6.3 10*3/uL (ref 4.0–10.5)

## 2010-06-24 LAB — D-DIMER, QUANTITATIVE
D-Dimer, Quant: 0.33 ug/mL-FEU (ref 0.00–0.48)
D-Dimer, Quant: 0.48 ug/mL-FEU (ref 0.00–0.48)

## 2010-06-24 LAB — POCT I-STAT, CHEM 8
BUN: 17 mg/dL (ref 6–23)
Calcium, Ion: 1.14 mmol/L (ref 1.12–1.32)
Chloride: 105 mEq/L (ref 96–112)
Creatinine, Ser: 1.3 mg/dL (ref 0.4–1.5)
Glucose, Bld: 144 mg/dL — ABNORMAL HIGH (ref 70–99)

## 2010-06-24 LAB — POCT CARDIAC MARKERS: Troponin i, poc: <0.05 ng/mL (ref 0.00–0.09)

## 2010-06-24 LAB — TSH: TSH: 1.679 u[IU]/mL (ref 0.350–4.500)

## 2010-06-24 LAB — CARDIAC PANEL(CRET KIN+CKTOT+MB+TROPI)
CK, MB: 1 ng/mL (ref 0.3–4.0)
Relative Index: INVALID (ref 0.0–2.5)
Total CK: 39 U/L (ref 7–232)
Total CK: 43 U/L (ref 7–232)
Troponin I: 0.02 ng/mL (ref 0.00–0.06)

## 2010-06-24 LAB — LIPID PANEL
Total CHOL/HDL Ratio: 8.5 RATIO
Triglycerides: 283 mg/dL — ABNORMAL HIGH (ref <150)
VLDL: 57 mg/dL — ABNORMAL HIGH (ref 0–40)

## 2010-06-24 LAB — TROPONIN I: Troponin I: 0.02 ng/mL (ref 0.00–0.06)

## 2010-06-24 LAB — BRAIN NATRIURETIC PEPTIDE: Pro B Natriuretic peptide (BNP): 99 pg/mL (ref 0.0–100.0)

## 2010-06-24 LAB — CK TOTAL AND CKMB (NOT AT ARMC)
CK, MB: 1.4 ng/mL (ref 0.3–4.0)
Relative Index: INVALID (ref 0.0–2.5)

## 2010-06-27 ENCOUNTER — Other Ambulatory Visit: Payer: Self-pay | Admitting: Cardiovascular Disease

## 2010-07-01 ENCOUNTER — Ambulatory Visit (INDEPENDENT_AMBULATORY_CARE_PROVIDER_SITE_OTHER): Payer: Medicare Other | Admitting: *Deleted

## 2010-07-01 DIAGNOSIS — I4891 Unspecified atrial fibrillation: Secondary | ICD-10-CM

## 2010-07-01 LAB — POCT INR: INR: 2.9

## 2010-07-29 ENCOUNTER — Ambulatory Visit (INDEPENDENT_AMBULATORY_CARE_PROVIDER_SITE_OTHER): Payer: Medicare Other | Admitting: *Deleted

## 2010-07-29 DIAGNOSIS — I4891 Unspecified atrial fibrillation: Secondary | ICD-10-CM

## 2010-07-29 LAB — POCT INR: INR: 2.4

## 2010-07-29 NOTE — H&P (Signed)
[name], [name] NO.:  [id number]   MEDICAL RECORD NO.:  [id number]          PATIENT TYPE:  INP   LOCATION:  2917                         FACILITY:  [facility]   PHYSICIAN:  Denice Bors. Stanford Breed, MD, FACCDATE OF BIRTH:  1930/05/31   DATE OF ADMISSION:  09/01/2008  DATE OF DISCHARGE:                              HISTORY & PHYSICAL   PRIMARY CARE PHYSICIAN:  Haywood Pao, MD   PRIMARY CARDIOLOGIST:  Juanda Bond. Burt Knack, MD   CHIEF COMPLAINT:  Presyncope.   HISTORY OF PRESENT ILLNESS:  Mr. Pehl is a 77 year old male with a  history of recent cardiac catheterization showing nonobstructive  coronary artery disease.  He saw Dr. Burt Knack on August 16, 2008 and at that  time was complaining of some dizziness.  At that time, he was not having  presyncope.  He began recording his blood pressure, and his blood  pressure by his records runs between the 0000000 systolic.  It is  generally in 130s and 140s.  He feels that it is higher first thing in  the morning but that is before he has taken his medication.  His heart  rate has been within normal limits during all of these records.   Mr. Leitzke has had multiple episodes of dizziness and being  swimmy  headed as well as facial tingling for the last several weeks.  He has  had some symptoms that are possibly orthostatic in nature, as he states  the dizziness never starts while he is sitting still but starts when he  gets up to walk or after he has been walking for a while.  Sometimes the  episodes of dizziness are prolonged.  It gets him multiple days of the  week.  He is not able to determine a consistent cause.  If he gets too  dizzy, he sits down and his symptoms improve.  Over the last 3-4 weeks,  he has also had several episodes where he gets dizzy and then will have  a brief loss of vision.  He feels presyncopal and tries to lean or sit  down, but has never fallen and has never completely loss of  consciousness.   There have been no episodes where any jerking or  twitching or seizure-like activity has been witnessed.  He has had no  incontinence of bowel or bladder.  He has not had any right or left  weakness.  There has been no chest pain or shortness of breath or  palpitations associated with this.  The loss of vision is extremely  brief, lasting only seconds and this is followed by nausea.  He will sit  down and rest and the episode will resolve.  Today, he came to the  emergency room because he had 1 episode and walk to his daughter's house  after resolved.  He sat down in his daughter's home and then had another  episode.  Currently in the emergency room, he is asymptomatic but very  concerned about these symptoms.   PAST MEDICAL HISTORY:  1. Status post cardiac catheterization on Aug 01, 2008, showing  coronary artery disease at 40-50% in the circumflex and the OM-1      with 30% in the RCA but no obstructive disease and an EF of 65%      with no significant MR.  2. Status post echocardiogram on Jul 31, 2008, showing an EF of 55-65      with a calcified mitral valve annulus and no PFO.  3. Paroxysmal nocturnal dyspnea and orthopnea, the reason for      admission in May 2010.  4. Paroxysmal atrial fibrillation in the setting of fever and acute      illness.  5. Hyperlipidemia.  6. Hypertension.  7. Gastroesophageal reflux disease.  8. Gout.  9. Obesity.   SURGICAL HISTORY:  He is status post cardiac catheterization as well as  right rotator cuff repair.   ALLERGIES:  He is allergic to PENICILLIN and has been intolerant of  STATINS.   CURRENT MEDICATIONS:  1. Allopurinol 300 mg daily p.r.n.  2. Cardizem CD 240 mg a day.  3. Lisinopril 20 mg a day.  4. Aspirin 81 mg a day.  5. Metoprolol 25 mg one-half tablet b.i.d.   SOCIAL HISTORY:  He lives in [place] with his wife and lives next  door to his daughter.  He is a retired Administrator, now Nutritional therapist on a daily  basis.  He has no significant history of tobacco  use and quit greater than 30 years and no history of alcohol or drug  abuse.   FAMILY HISTORY:  His mother died at age 44 of cancer without heart  disease and his father died at age 86 of Alzheimer's.  He does have one  brother who has coronary artery disease.   REVIEW OF SYSTEMS:  He has not had fever, chills, or sweats.  He reports  a nasal drainage down the back of his throat.  He has occasional  headaches.  He denies vertigo.  The presyncope is described above.  He  has had cough and has had coughing paroxysms significant enough to cause  presyncope.  He has occasional white and possible yellow phlegm in the  morning.  He denies melena, hematemesis, or hemoptysis, hemoptysis.  He  has had no reflux symptoms.  Full 14-point review of systems is,  otherwise, negative.   PHYSICAL EXAMINATION:  VITAL SIGNS:  Temperature is 98.2, blood pressure  118/72, pulse 76, respiratory rate 16, O2 saturation 96% on room air.  GENERAL:  He is a well-developed, well-nourished white male in no acute  distress.  HEENT:  Essentially normal.  NECK:  There is no lymphadenopathy, thyromegaly, bruit, or JVD noted.  CV:  His heart is regular in rate and rhythm with an S1 and S2 and no  significant murmur, rub, or gallop is noted.  LUNGS:  Essentially clear to auscultation bilaterally.  SKIN:  No rashes or lesions are noted.  ABDOMEN:  Firm and nontender with active bowel sounds and nondistended.  There is no hepatosplenomegaly by percussion.  EXTREMITIES:  There is no cyanosis, clubbing, or edema noted.  MUSCULOSKELETAL:  There is no joint deformity or effusions and no spine  or CVA tenderness.  NEURO:  He is alert and oriented.  Cranial nerves II through XII grossly  intact.   Chest x-ray, no acute disease.   CT of the head without contrast, no acute intracranial abnormality with  mild chronic microvascular white matter disease.   EKG, sinus rhythm  with no acute ischemic changes.  LABORATORY VALUES:  Hemoglobin 15.9, hematocrit 46.2, WBCs 7.0,  platelets 201, INR 1.0.  Sodium 136, potassium 5.8, chloride 105 CO2 25,  BUN 28, creatinine 1.38, glucose 100, GFR of 50.  Point-of-care marker  is negative x1.  Urinalysis negative.   IMPRESSION:  Mr. Stanislaw was seen today by Dr. Stanford Breed.  He is a 6-year-  old male with past medical history of hypertension, hyperlipidemia who  had an isolated episode of atrial fibrillation in the setting of an  acute illness.  He is here today with near syncope.  A recent  catheterization on Jul 23, 2008, showed no obstructive coronary artery  disease and normal left ventricular function.  An echocardiogram on Jul 23, 2008, showed normal left ventricular function as well.  He complains  of intermittent dizziness and also spells where he feels things are  going black for 2-3 seconds.  There are no associated palpitations,  chest pain, nausea, vomiting, weakness in his extremities, or shortness  of breath.  He has not had frank syncope.  His EKG is sinus rhythm with  no ST changes.  His potassium is 5.8, but the amount of homolysis is  unknown and a recheck is pending.  The etiology of a presyncope is  unclear.  There is concern for bradycardic events rather arrhythmias.  He will be admitted to telemetry.  If no arrhythmias are seen overnight,  he will be discharged in the morning with a CardioNet as an outpatient.  We will follow his potassium and renal function closely.      Rosaria Ferries, PA-C      Denice Bors. Stanford Breed, MD, [facility]  Electronically Signed    RB/MEDQ  D:  09/01/2008  T:  09/02/2008  Job:  [id number]

## 2010-07-29 NOTE — Assessment & Plan Note (Signed)
[name]                            CARDIOLOGY OFFICE NOTE   [name], [name]                       MRN:          [id number]  DATE:08/05/2007                            DOB:          Mar 27, 1930    [name] was seen in followup at the [facility] Cardiology office on  Aug 05, 2007.  [name] is a 77 year old gentleman who had a single  episode of atrial fibrillation during a febrile illness approximately 1  year ago.  He has had no further problems.  He has undergone outpatient  CardioNet recording and multiple office visits where he has always been  in sinus rhythm.  [name] also has hypertension and has undergone  some medication adjustments over the last year.  He stays active with  working in his garden and maintaining his 3-acre property.  He has no  exertional symptoms.  He specifically denies chest pain, dyspnea,  orthopnea, PND or edema.  He had occasional lightheadedness with certain  movements of the head, but once he had a tooth pulled, the symptom went  away.  He has no other complaints.   MEDICATIONS:  1. Allopurinol 100 mg daily.  2. Cardizem CD 240 mg daily.  3. Lisinopril 20 mg daily.  4. Aspirin 162 mg daily.  5. Metoprolol 25 mg twice daily.   ALLERGIES:  PENICILLIN.   PHYSICAL EXAMINATION:  The patient is an alert and oriented, obese male  in no acute distress.  Weight is 195; this is up 4 pounds for his last visit.  Blood pressure  130/70, heart rate 66, respiratory rate 16.  HEENT:  Normal.  NECK:  Normal carotid upstrokes without bruits.  Jugulovenous pressure  is normal.  LUNGS:  Clear to auscultation bilaterally.  HEART:  Regular rate and rhythm.  No murmurs or gallops.  ABDOMEN:  Soft, obese and nontender.  EXTREMITIES:  No clubbing, cyanosis or edema.  Peripheral pulses 2+ and  equal throughout.  SKIN:  Warm and dry without rash.   EKG:  Shows sinus rhythm and is within normal limits.  Heart rate 66  beats per minute.   ASSESSMENT:  1. Paroxysmal atrial fibrillation with a single episode during a      febrile illness 1 year ago.  Continue the aspirin, metoprolol and      Cardizem.  He maintains sinus.  Coumadin was discontinued 1 year      ago.  2. Hypertension.  Blood pressure well controlled at the office today.      He brought in some home readings that show elevations with      systolics in the 0000000.  He will need to keep an eye on this and if      a consistent pattern of elevated pressures develops, we will need      to adjust medications.   For followup, I would like to see [name] back in 1 year.  He is  stable from a cardiac standpoint.  If he has trouble in the interim, I  would be happy to see him at any time.  Juanda Bond. Burt Knack, MD  Electronically Signed    MDC/MedQ  DD: 08/05/2007  DT: 08/05/2007  Job #: [id number]   cc:   Nelda Severe. Juventino Slovak, M.D.

## 2010-07-29 NOTE — Assessment & Plan Note (Signed)
[place]                            CARDIOLOGY OFFICE NOTE   Nathan, [name]                       MRN:          [id number]  DATE:12/21/2006                            DOB:          08-13-1930    [name] presents for followup at the [facility] Cardiology Office on  December 21, 2006.  [name] is a very nice 77 year old gentleman with  hypertension and paroxysmal A-fib.  He had an episode of atrial  fibrillation during an acute febrile illness.  It is the only episode  that he has had.  He underwent CardioNet recording over a 3 week period  and had no recurrent atrial fibrillation and has remained in sinus  rhythm ever since.  He checks his blood pressures and heart rate on a  daily basis and his heart rate is always in the 60's.  His blood  pressure control has been ideal since his last visit and has ranged with  systolic pressures from 123XX123 into the low 123XX123 and diastolic pressures  in the 60's and 70's.  He has no cardiac symptoms.  He specifically  denies chest pain, dyspnea, palpitations, lightheadedness, syncope or  edema.   CURRENT MEDICINES:  1. Allopurinol 100 mg daily.  2. Aspirin 81 mg two daily.  3. Cardizem CD 240 mg daily.  4. Lopressor 25 mg twice daily.  5. Lisinopril 20 mg daily.   ALLERGIES:  PENICILLIN.   EXAM:  He is alert and oriented, in no acute distress.  Weight is 191,  blood pressure 130/70, heart rate 60, respiratory rate is 16.  HEENT:  Normal.  NECK:  Normal carotid upstrokes without bruits.  Jugular pressure is  normal.  LUNGS:  Clear to auscultation bilaterally.  HEART:  Regular rate and rhythm without murmurs or gallops.  ABDOMEN:  Soft, obese, nontender, no organomegaly.  EXTREMITIES:  No clubbing, cyanosis or edema.   EKG shows normal sinus rhythm with a ventricular rate of 60 bpm, EKG is  within normal limits.  An echocardiogram from February 11 showed normal  left ventricular size and systolic  function with an LVEF of 55 to 60%.  Atrial size was normal.   ASSESSMENT:  1. Paroxysmal atrial fibrillation.  [name] remains in sinus      rhythm.  He had one episode of atrial fibrillation during a febrile      illness.  He was treated with warfarin for a few months and with      maintaining sinus rhythm on his CardioNet recorder, as well as no      recurrence of atrial fibrillation in the setting of normal left      ventricular function, he has been maintained on aspirin alone.  No      changes were made to his medical regimen today.  2. Hypertension.  His blood pressure is under ideal control with a      combination of Cardizem, metoprolol and lisinopril.  3. Obesity.  I asked him to initiate an exercise program and make      dietary modifications.  4. Followup.  I would like to see [name] back in six months for      followup.     Juanda Bond. Burt Knack, MD  Electronically Signed    MDC/MedQ  DD: 12/21/2006  DT: 12/21/2006  Job #: [id number]   cc:   Nelda Severe. Juventino Slovak, M.D.

## 2010-07-29 NOTE — H&P (Signed)
[name], [name] NO.:  [id number]   MEDICAL RECORD NO.:  [id number]          PATIENT TYPE:  EMS   LOCATION:  MAJO                         FACILITY:  [facility]   PHYSICIAN:  Loralie Champagne, MD      DATE OF BIRTH:  [birth year]   DATE OF ADMISSION:  07/31/2008  DATE OF DISCHARGE:                              HISTORY & PHYSICAL   PRIMARY CARDIOLOGIST:  Juanda Bond. Burt Knack, MD   PRIMARY CARE Malena Timpone:  Haywood Pao, MD   PATIENT PROFILE:  A 77 year old married Caucasian male with prior  history of paroxysmal atrial fibrillation who presents with a 2 to 3-  week history of nightly PND and orthopnea.   PROBLEMS:  1. Paroxysmal nocturnal dyspnea and orthopnea.  2. History of atrial fibrillation in 2008 in the setting of      respiratory infection.      a.     CHADS score equals 3.  3. Hyperlipidemia.  4. Hypertension.  5. Reported history of normal coronary arteries by cardiac      catheterization performed in 2008 in [place].  6. Gastroesophageal reflux disease.  7. Status post right rotator cuff surgery in 2008.  8. Gout.  9. Obesity.   HISTORY OF PRESENT ILLNESS:  A 77 year old married married Caucasian male with  history of PAF and reportedly normal coronary arteries.  He was in his  usual state of health for approximately 3 weeks when he began to  experience nightly PND, orthopnea, and chest tightness awaking him in  the middle of the night lasting up to several hours at a time and  improved by sitting up and getting up and walking around.  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  Last night, he awoke with chest tightness,  shortness of breath, and sat up and went to the bathroom.  On the way  back to the bathroom, he felt presyncopal and says things went black.  He grabbed the bedpost, stood himself, and did not lose consciousness.  He called into the answering service this morning and I spoke  with him  and advised to come into the ED.  Currently, he is symptom-free.   ALLERGIES:  PENICILLIN.   HOME MEDICATIONS:  1. Allopurinol 3 mg daily.  2. Cardizem CD 240 mg daily.  3. Lisinopril 20 mg daily.  4. Aspirin 81 mg daily.  5. Lopressor 25 mg b.i.d.   FAMILY HISTORY:  Mother died in her 61s of stomach cancer.  Father died  of old age at 60.  He has a brother who is status post CABG and 4  sisters who are alive and well.   SOCIAL HISTORY:  He lives in [place] with his wife.  He is a retired  Administrator, although now Audiological scientist.  He previously smoked a  pack a week for about 4 years but quit this in 1971.  He quit all  alcohol in the 70s.  Denies drug use.  He is not routinely exercising.   REVIEW OF SYSTEMS:  Positive  for chest tightness, dyspnea, PND,  orthopnea as well as intermittent presyncope, especially when moving  from a lying to seated or standing position.  Otherwise, all systems  reviewed are negative.  He is a full code.   PHYSICAL EXAMINATION:  VITAL SIGNS:  Temperature 97.9, heart rate 54,  respirations 18, blood pressure 142/68, and pulse ox 97% on room air.  GENERAL:  Pleasant white male in no acute distress, awake, alert, and  oriented x3.  HEENT:  Normal.  NEUROLOGIC:  He has a normal affect.  Neuro is grossly intact and  nonfocal.  SKIN:  Warm and dry without lesions or masses.  MUSCULOSKELETAL:  Moves all extremities.  NECK:  Supple without bruits.  JVP is estimated to be 12-14 cm.  LUNGS:  Respirations are regular and unlabored.  Clear to auscultation.  CARDIAC:  Regular S1-S2.  Positive S4.  No murmurs.  ABDOMEN:  Obese, soft, nontender, and nondistended.  Bowel sounds  present x4.  EXTREMITIES:  Warm, dry, and pink.  No clubbing, cyanosis, or edema.  Dorsalis pedis and posterior tibial pulses are 2+ and equal bilaterally.   DIAGNOSTIC DATA:  Chest x-ray is pending.  EKG shows sinus rhythm.  No  acute ST-T changes.   LABORATORY  DATA:  Hematocrit 46, platelets 181, and WBC 6.3.  Sodium  139, potassium 4.2, chloride 105, CO2 23, BUN 17, and creatinine 1.3.  CK-MB 1.  Troponin I less than 0.05.   ASSESSMENT AND PLAN:  1. Dyspnea with paroxysmal nocturnal dyspnea and orthopnea.  These      episodes have been occurring nightly for several weeks and symptoms      last hours at a time.  The patient does have a history of atrial      fibrillation, although it seems unlikely that this is contributing      as his symptoms are occurring so regularly.  Obstructive sleep      apnea may be contributing.  We will plan to admit and cycle cardiac      markers.  We will check a 2-D echocardiogram today and if his      ejection fraction is down or wall motion abnormality is noted, we      will plan right and left heart cardiac catheterization in the a.m.      He does have evidence of volume overload on exam.  We will check a      BNP and also write for IV Lasix.  We will otherwise continue his      home medications.  2. History of atrial fibrillation.  The patient had an isolated      episode of atrial fibrillation in the setting of respiratory      infection in 2008.  He wore CardioNet monitor following that      episode and was subsequently taken off Coumadin because he had no      recurrence of atrial fibrillation.  The CHADS score is 3, now with      a history of congestive heart failure in addition to him now being      77 and also hypertensive.  We will plan to reconsider long-term      anticoagulation.  3. Hypertension.  Blood pressure was elevated slightly in the      emergency room, although he reports symptoms of orthostasis at      home.  We will check orthostatics here.  4. Possible sleep apnea, maybe contributing to dyspnea with paroxysmal  nocturnal dyspnea and orthopnea.  The patient will need outpatient      sleep study.  5. Gout.  Continue allopurinol.      Murray Hodgkins, ANP      Loralie Champagne, MD  Electronically Signed    CB/MEDQ  D:  07/31/2008  T:  07/31/2008  Job:  [id number]

## 2010-07-29 NOTE — Discharge Summary (Signed)
[name], [name] NO.:  [id number]   MEDICAL RECORD NO.:  [id number]          PATIENT TYPE:  INP   LOCATION:  2917                         FACILITY:  [facility]   PHYSICIAN:  Denice Bors. Stanford Breed, MD, FACCDATE OF BIRTH:  10-21-1930   DATE OF ADMISSION:  09/01/2008  DATE OF DISCHARGE:  09/02/2008                               DISCHARGE SUMMARY   PROCEDURES:  CT of the head without contrast.   PRIMARY FINAL DISCHARGE DIAGNOSIS:  Near syncope.   SECONDARY DIAGNOSES:  1. Status post cardiac catheterization on Aug 01, 2008, showing an      ejection fraction of 65%, circumflex 50, obtuse marginal-1 50,      right coronary artery 30, and medical therapy recommended.  2. Remote history of paroxysmal atrial fibrillation in 2008 in the      setting of an acute illness.  3. Hypertension.  4. Hyperlipidemia.  5. Gastroesophageal reflux disease.  6. Borderline obesity with a body mass index of 31.6.  7. Status post echocardiogram on Jul 31, 2008, showing an ejection      fraction of 55-65% with a calcified mitral valve annulus but no      patent foramen ovale.  8. History of paroxysmal nocturnal dyspnea and orthopnea, admitted in      May 2010 with outpatient sleep study recommended.  9. Allergy or intolerance to PENICILLIN and STATIN.  1-.  Status post right rotation cuff repair.   TIME OF DISCHARGE:  33 minutes.   HOSPITAL COURSE:  [name] is a 77 year old male with nonobstructive  coronary artery disease by cath in May 2010.  When he saw Dr. Burt Knack in  followup, he was having problems with dizziness and possibly vertigo.  Also because of the orthopnea and possible sleep apnea by descriptions,  outpatient sleep study is planned but has not yet been arranged.   On the day of admission, [name] had 2 episodes of near syncope where  he was standing and then everything would go black for just 1 or 2  seconds.  He did not fall.  One of the episodes was witnessed and  there  was no seizure activity.  There was no incontinence.  He had other  episodes previously, but because there were 2 on the same day he became  concerned and came to the hospital where he was admitted for overnight  observation.   Overnight, he had no significant arrhythmia.  His vital signs were  stable.  His heart rate was in the 60s and 70s.  His O2 saturation was  98% on room air.  A chest x-ray and head CT without contrast showed no  acute disease.   On September 02, 2008, [name] had no further episodes.  There was some  problems with sinus congestion and possible sinus infection, so he was  started on antibiotics.  Dr. Stanford Breed evaluated [name] on September 02, 2008, felt him stable for discharge with an outpatient CardioNet monitor  and sleep study.   DISCHARGE INSTRUCTIONS:  His activity level is to be increased gradually  with  no driving until cleared by MD.  He is to stick to a low-sodium,  heart-healthy diet.  He is to follow up with Dr. Burt Knack after the  [place] monitor and our office will call.  He is to get an outpatient  sleep study.  He is to follow up with Dr. Osborne Casco as needed.   DISCHARGE MEDICATIONS:  1. Allopurinol 300 mg a day.  2. Diltiazem CD 240 mg a day.  3. Lisinopril 20 mg daily.  4. Metoprolol tartrate 25 mg one-half tab b.i.d.  5. Aspirin 81 mg a day.  6. Claritin 10 mg a day.  7. Levaquin 500 mg daily for a week.      Rosaria Ferries, PA-C      Denice Bors. Stanford Breed, MD, [facility]  Electronically Signed    RB/MEDQ  D:  09/02/2008  T:  09/03/2008  Job:  [id number]   cc:   Haywood Pao, M.D.

## 2010-07-29 NOTE — Cardiovascular Report (Signed)
[name], LEIMAN NO.:  [id number]   MEDICAL RECORD NO.:  [id number]          PATIENT TYPE:  INP   LOCATION:  3715                         FACILITY:  [facility]   PHYSICIAN:  Loretha Brasil. Lia Foyer, MD, FACCDATE OF BIRTH:  02-Sep-1930   DATE OF PROCEDURE:  DATE OF DISCHARGE:  08/01/2008                            CARDIAC CATHETERIZATION   INDICATIONS:  The patient is a gentleman who has been admitted with  shortness of breath.  Echocardiogram was done.  He has normal LV with EF  of 55-65%, normal wall thickness.  BNP was 99.  Cardiac enzymes were  negative.  Chest x-ray was consistent with old granulomatous disease.  Current study was done to assess coronary anatomy.   PROCEDURE:  1. Left heart catheterization.  2. Selective coronary arteriography.  3. Selective left ventriculography.   DESCRIPTION OF PROCEDURE:  The patient was brought to the Cath Lab,  prepped and draped in usual fashion after informed consent.  5-French  sheaths were used.  Through an anterior puncture, the right femoral  artery was easily entered.  Views of the left and right coronary  arteries were obtained with multiple angiographic projections.  Central  aortic and left ventricular pressures were measured with a pigtail  catheter.  Ventriculography was performed in the RAO projection.  There  were no complications.  He tolerated the procedure well.  He was taken  to the holding area for sheath removal.   HEMODYNAMIC DATA:  1. Central aortic pressure 116/67, mean 88.  2. Left ventricular pressure 126/15.  3. There was no significant gradient or pullback across aortic valve.   ANGIOGRAPHIC DATA:  1. On plain fluoroscopy there appears to be pretty extensive      calcification of the mitral annulus and perhaps part of the      leaflet.  2. The left ventriculography in the RAO projection reveals vigorous      global systolic function with EF in excess of 65%.  No significant      mitral  regurgitation is noted.  3. The left main is free of critical disease.  4. The LAD courses to the apex and provides one major diagonal branch.      The LAD has minor luminal irregularities but no significant high-      grade focal abnormalities.  5. The circumflex provides a large marginal and then a second      marginal.  Prior to the bifurcation of the marginals, there is a      focal somewhat eccentric plaque of 40-50% at most.  The large      marginal, representing OM1 has a diffuse area of 40% luminal      irregularity.  The distal vessel was intact.  The second marginal      or AV circumflex is without critical narrowing.  6. The right coronary artery is somewhat smaller caliber vessel with      diffuse 30% plaquing in the midportion.  It divides distally into a      acute marginal/PDA subbranch and a posterolateral system all of  which are without critical narrowing.   CONCLUSIONS:  1. Well-preserved left ventricular function with mitral annular      calcification and no significant mitral regurgitation.  2. Scattered diffuse plaquing of the circumflex coronary artery that      does not appear to be critical.  3. Scattered irregularities as noted above.   DISPOSITION:  1. The patient will be treated medically.  2. The patient needs to followup with Dr. Burt Knack in the office and      also needs a sleep study.  We will get a D-dimer today.      Loretha Brasil. Lia Foyer, MD, [facility]  Electronically Signed     TDS/MEDQ  D:  08/01/2008  T:  08/02/2008  Job:  [id number]   cc:   Haywood Pao, M.D.  Juanda Bond. Burt Knack, MD  [facility]

## 2010-07-29 NOTE — Discharge Summary (Signed)
[name], [name] NO.:  [id number]   MEDICAL RECORD NO.:  [id number]          PATIENT TYPE:  INP   LOCATION:  3715                         FACILITY:  [facility]   PHYSICIAN:  Nathan Bond. Burt Knack, MD  DATE OF BIRTH:  10-31-1930   DATE OF ADMISSION:  07/31/2008  DATE OF DISCHARGE:  08/01/2008                               DISCHARGE SUMMARY   PRIMARY CARDIOLOGIST:  Nathan Bond. Burt Knack, MD   PRIMARY CARE [name]:  Nathan Pao, MD   DISCHARGE DIAGNOSIS:  Paroxysmal nocturnal dyspnea.   SECONDARY DIAGNOSES:  1. Remote history of paroxysmal atrial fibrillation in 2008 in the      setting of respiratory infection.  2. Hypertension.  3. Hyperlipidemia.  4. Nonobstructive coronary artery disease by cardiac catheterization      this admission.  5. Gastroesophageal reflux disease.  6. Status post right rotator cuff surgery in 2008.  7. Gout.  8. Obesity.   ALLERGIES:  PENICILLIN and STATIN intolerance.   PROCEDURES:  Left heart cardiac catheterization revealing nonobstructive  CAD with normal LV function.  EF of 60%.  A 2-D echocardiogram performed  Jul 31, 2008, showing an EF of 55-65%, mildly dilated left atrium.   HISTORY OF PRESENT ILLNESS:  A 77 year old Caucasian male with the above  problem list.  He was in his usual state of health.  Approximately 3  weeks ago, he began to experience nightly PND, orthopnea, and chest  tightness awake him in the middle of the night, lasting up to several  hours at a time, improved by sitting up and getting up and walking  around.  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  On the evening prior  to admission, he awoke with recurrent chest tightness and dyspnea as  well as presyncope.  With these reasons, he presented to the [facility]  ED.  In the ED, he was felt to be mildly volume  overloaded with slightly  elevated JVP on exam.  His chest x-ray showed no acute findings and his  cardiac markers were negative.  He was admitted for further evaluation.   HOSPITAL COURSE:  The patient was gently diuresed.  His BNP was 99.  We  in the setting of his PND and orthopnea, a 2-D echocardiogram was  performed and this showed normal LV function.  We also performed the  left heart cardiac catheterization given his multiple risk factors and  this is showing nonobstructive coronary artery disease and normal LV  function.   PLAN OF DISCHARGE:  [name] home today in good condition.   DISCHARGE LABORATORY DATA:  Hemoglobin 16.7, hematocrit 49.0, WBC 6.3,  platelets 181.  D-dimer 0.33.  Sodium 140, potassium 3.9, chloride 96,  CO2 of 31, BUN 15, creatinine 1.22, glucose 108.  Total bilirubin 0.6,  alkaline phosphatase 92, AST 25, ALT 24.  Total protein 7.1, albumin  209, calcium 9.6, hemoglobin A1c 6.1.  CK 43, MB 1.0, troponin  I 0.02.  Total cholesterol 230, triglycerides 283, HDL 27, LDL 146.  TSH 1.679.   DISPOSITION:  The patient will be discharged to home today in good  condition.   FOLLOWUP PLANS AND APPOINTMENTS:  The patient is to follow up with Dr.  Burt [name] on August 23, 2008.  He is asked to follow up with Dr. Osborne [name] in 1-  2 weeks.  We also need to arrange for an outpatient sleep study as this  may be contributing to his symptoms.   DISCHARGE MEDICATIONS:  1. Allopurinol 300 mg daily p.r.n.  2. Lisinopril 20 mg daily.  3. Metoprolol tartrate 25 mg b.i.d.  4. Aspirin 81 mg daily.  5. Diltiazem ER 240 mg daily.   OUTSTANDING LABORATORY STUDIES:  The patient will need to be sent for  outpatient sleep study.   DURATION OF DISCHARGE ENCOUNTER:  40 minutes including physician time.      [name], ANP      Nathan Bond. Burt Knack, MD  Electronically Signed    CB/MEDQ  D:  08/01/2008  T:  08/02/2008  Job:  [id number]   cc:   [name], M.D.

## 2010-08-01 NOTE — Discharge Summary (Signed)
NAMEEAGAN, BAERGA NO.:  [id number]   MEDICAL RECORD NO.:  [id number]          PATIENT TYPE:  INP   LOCATION:  [id number]                         FACILITY:  [facility]   PHYSICIAN:  Sherryl Manges, M.D.  DATE OF BIRTH:  [birth year]   DATE OF ADMISSION:  04/25/2006  DATE OF DISCHARGE:  05/01/2006                               DISCHARGE SUMMARY   DISCHARGE DIAGNOSIS:  1. Upper respiratory infection/acute bronchitis.  2. Paroxysmal atrial fibrillation.  3. Hypertension.  4. History of gout.   DISCHARGE MEDICATIONS:  1. Allopurinol 100 mg p.o. daily.  2. Enteric coated aspirin 81 mg p.o. daily.  3. Prilosec 20 mg p.o. daily.  4. Cardizem CD 240 mg p.o. daily.  5. Mucinex 600 mg p.o. b.i.d. for one week only.  6. Nasonex nasal spray 2 sprays each nostril daily.  7. Azithromycin 250 mg p.o. daily for five days from May 02, 2006.  8. Lopressor 25 mg p.o. daily.  9. Coumadin per INR, however, currently on 6 mg p.o. at 6 p.m. daily.   PROCEDURES:  1. Chest x-ray dated April 24, 2006, showed old granulomatous      disease, pulmonary hyperaeration which may affect COPD, negative      for pneumonia.  2. Chest x-ray dated April 25, 2006, showed no acute disease with      bilateral calcified granulomas noted.  3. X-ray right shoulder dated April 26, 2006, this showed no acute      or subacute abnormalities, mild degenerative changes in the      acromioclavicular joint.  4. Chest CT angiogram dated April 27, 2006, this showed no evidence      of pulmonary embolism or other acute process with old granulomatous      disease noted, there was also a fatty liver.  5. Two view chest x-ray dated April 29, 2006, this showed old      calcified granulomata, COPD, no active disease.  6. 2D echocardiogram dated April 27, 2006, this showed overall      normal left ventricular systolic function, EF [id number], the study was      inadequate for evaluation of left  ventricular regional wall motion,      LV thickness was mildly to moderately increased, left ventricular      diastolic function parameters were normal, there was moderate      mitral annular calcification.   CONSULTATIONS:  1. Juanda Bond. Burt Knack, M.D., [facility] Cardiology  2. Michel Bickers, M.D., infectious disease.   ADMISSION HISTORY:  As in H&P notes of April 25, 2006, dictated by  Dr. Donia Ast. However, in brief, this is a 77 year old male, with  known history of hypertension, dyslipidemia, right shoulder rotator cuff  tear, status post repair January 2008, who presents with fever  associated with chills.  Reportedly, in the few days prior to  presentation, he has had rhinorrhea, shortness of breath, and was seen  in the emergency department, where he had nasal swab for type A and B  influenza which were negative.  He was started on Tamiflu.  On  evaluation in the emergency department, he was found to be in rapid  atrial fibrillation.  The patient has no prior history of A-fib.  Chest  x-ray showed no evidence of pneumonia.  There was pulmonary  hyperaeration consistent with COPD, also old granulomatous disease.  The  patient was admitted for further evaluation, investigation, and  management.   CLINICAL COURSE:  1. Febrile illness.  The patient presents with recurrent pyrexia,      chills, and rhinorrhea.  Chest x-ray was not helpful, and neither      was urinalysis.  He was, however, commenced empirically on a      combination of Rocephin and Azithromycin with some amelioration in      symptomatology.  This antibiotic combination was discontinued on      April 28, 2006, as the patient had defervesced, the cultures      were negative, and no focal infection was apparent.  However,  by      April 29, 2006, he had spiked another pyrexia, and was placed,      therefore, on Azithromycin because he had become mildly wheezy and      had a productive cough.  The patient  subsequently also complained      of nasal congestion, bilateral sinus discomfort, and was able to      blow out purulent mucous from his nostrils.  He was, therefore,      commenced on Nasonex nasal spray.  Of note, 2D echocardiogram      showed no structural abnormalities.  Chest CT angiogram showed no      evidence of pulmonary embolism.  ID consultation was called to help      evaluate this febrile illness.  Consultation was kindly provided by      Dr. Michel Bickers, who concurred that the patient had upper      respiratory illness and possibly acute bronchitis/sinusitis.  He      has recommended continuing Azithromycin.  It is anticipated that      the patient will complete a total of ten days course of      Azithromycin, albeit on an outpatient basis.  As of May 01, 2006, the patient felt better, although he still had a productive      cough, and he had remained apyrexial overnight.   1. Paroxysmal atrial fibrillation.  For details of presentation, refer      to admission history above.  The patient, on initial evaluation,      was found to be in rapid atrial fibrillation.  Cardiac enzymes were      cycled and remained unelevated.  2D echocardiogram showed no      structural abnormalities, and he did have good LV function.      Cardiology consultation was called, which was kindly provided by      Dr. Burt Knack who has commenced patient on a combination of Cardizem      and beta blockade and also commenced him on anticoagulation.      Arrangements have been put in place for him to follow up at the      [facility] on May 03, 2006.   1. Hypertension.  The patient remained normotensive throughout his      hospitalization, on a combination of Cardizem CD and Lopressor.   1. History of gout.  There were no symptoms referable to this, during      the course  of the patient's hospitalization.  1. History of right rotator cuff arthroscopic repair January 2008.       There were no problems referable to this, during the course of the      patient's hospitalization.  He will continue to follow up with his      orthopedic surgeon, per prior scheduled appointment, following      discharge.   DISPOSITION:  The patient was considered sufficiently clinically  recovered and stable to be discharged on May 01, 2006.  Activities  as tolerated.  Diet low sodium/heart healthy diet.   FOLLOW UP:  The patient is to follow up with Dr. Burt Knack at [facility]  Cardiology, telephone number [phone], appointment date May 19, 2006,  at 10:30 a.m.  He is expected to be seen at the [facility]  on  May 03, 2006, at 3:15 p.m., telephone number [phone].  The patient  should follow up with his primary MD, Dr. Ihor Gully, in the coming  week.  All of this has been communicated to the patient who verbalized  understanding.      Sherryl Manges, M.D.  Electronically Signed     CO/MEDQ  D:  05/01/2006  T:  05/01/2006  Job:  [id number]   cc:   Nelda Severe. Juventino Slovak, M.D.  Juanda Bond. Burt Knack, MD

## 2010-08-01 NOTE — Assessment & Plan Note (Signed)
[place]                            CARDIOLOGY OFFICE NOTE   JEZIAH, LANDGREN                       MRN:          [id number]  DATE:05/19/2006                            DOB:          12-06-1930    [name] was seen in hospital followup as an outpatient at the  [facility] Cardiology clinic on May 19, 2006.  He is a 77 year old  gentleman who was hospitalized from February 10th through the 16th with  a febrile illness and atrial fibrillation.  An etiology for his fever  was never determined, but he was noted to be in atrial fib with RVR at  the time of admission.  Of note, he had no palpitations, dyspnea or  chest pain with this rhythm.  He was treated with intravenous Cardizem  initially and was ultimately converted to oral Cardizem and metoprolol.  He converted to sinus rhythm after approximately 3 days.  An  echocardiogram was performed that demonstrated normal left ventricular  size and function and really no significant structural heart disease.   Mr. Levasseur had no previous history of atrial fib.  He has been seen by  Dr. Juventino Slovak regularly over the years and has always been in sinus rhythm.  He has no history of palpitations, chest pain, dyspnea, edema,  orthopnea, PND or syncope.  He has done well since his hospital  discharge and has had no further symptoms.  He has been walking for  exercise and feels good with that.  He has been taking his blood  pressure regularly at home, and it has been running in the 150's/70's to  80's.   CURRENT MEDICATIONS:  1. Allopurinol 100 mg daily.  2. Aspirin 81 mg daily.  3. Cardizem CD 240 mg daily.  4. Lopressor 25 mg twice daily.  5. Coumadin, which is currently on hold.   ALLERGIES:  PENICILLIN.   EXAMINATION:  GENERAL:  The patient is alert and oriented.  He is no  acute distress.  VITAL SIGNS:  Weight is 191 pounds.  Blood pressure was initially  160/100, on re-check was 172/91, heart rate  71, respirations 12.  HEENT:  Normal.  NECK:  Normal carotid upstrokes without bruits.  Jugular venous pressure  is normal.  LUNGS:  Clear to auscultation bilaterally.  HEART:  Regular rate and rhythm without murmurs or gallops.  ABDOMEN:  Soft, obese, nontender, no organomegaly.  EXTREMITIES:  No clubbing, cyanosis or edema.  Peripheral pulses 2+ and  equal throughout.  I re-checked Mr. Terral blood pressure myself, and  it was 190/100 on my check.   EKG demonstrates normal sinus rhythm and is within normal limits.   ASSESSMENT:  Mr. Caleb is a 77 year old man with the following cardiac  issues:  1. Paroxysmal atrial fibrillation.  My suspicion is that Mr. Silerio      had atrial fibrillation associated with his febrile viral illness.      The only bothersome thing to me is that he was completely      asymptomatic with his atrial fib, and he certainly could be having  paroxysms of  a-fib at home, without knowing it.  The big question is whether he needs  long-term anticoagulation.  I think the best way to determine this would  be to check an event recorder, and I have asked to set him up with a  CardioNet three-week recording, so that we can determine whether he is  having any paroxysms of atrial fib.  If he is having paroxysmal atrial  fib, he certainly will benefit from long-term anticoagulation, as he has  associated stroke risk factors of age and hypertension.  If not, I would  be inclined to stop his Coumadin after a short period of time and just  keep him on antiplatelet therapy with aspirin.  He is currently  tolerating both Cardizem and Lopressor without side effects, and I would  like him to stay on these medications.  1. Essential hypertension with suboptimal control.  I have asked him      to start hydrochlorothiazide 25 mg daily.  He will take this, in      addition to his Cardizem and Lopressor.  He will record his blood      pressures, which he already checks  regularly.  I would like to see      him back in one month for followup, and at that time will review      his blood pressure control and determine if he needs any more      medication.  2. Followup.  Again, I would like to see him back in one month and at      that point will review his CardioNet results, as well as his blood      pressure readings and will determine whether we can discontinue his      Coumadin, and also whether he needs more anti-hypertensive      medication.     Juanda Bond. Burt Knack, MD  Electronically Signed    MDC/MedQ  DD: 05/19/2006  DT: 05/19/2006  Job #: [id number]   cc:   Nelda Severe. Juventino Slovak, M.D.

## 2010-08-01 NOTE — Op Note (Signed)
NAMEJAEGER, ARNESON                ACCOUNT NO.:  [id number]   MEDICAL RECORD NO.:  [id number]          PATIENT TYPE:  AMB   LOCATION:  [place]                          FACILITY:  [facility]   PHYSICIAN:  Robert A. Noemi Chapel, M.D. DATE OF BIRTH:  08-04-1930   DATE OF PROCEDURE:  04/13/2006  DATE OF DISCHARGE:                               OPERATIVE REPORT   PREOPERATIVE DIAGNOSES:  1. Right shoulder rotator cuff tear.  2. Right shoulder biceps tendon rupture.  3. Right shoulder partial labrum tear.  4. Right shoulder impingement.  5. 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   POSTOPERATIVE DIAGNOSES:  1. Right shoulder rotator cuff tear.  2. Right shoulder biceps tendon rupture.  3. Right shoulder partial labrum tear.  4. Right shoulder impingement.  5. 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   PROCEDURES:  1. Right shoulder EUA followed by arthroscopically-assisted rotator      cuff repair, using Arthrex suture and anchor x1, plus side-to-side      repair.  2. Right shoulder partial labrum tear debridement.  3. Right shoulder subacromial decompression.  4. Right shoulder distal clavicle excision.   SURGEON:  Audree Camel. Noemi Chapel, M.D.   ASSISTANT:  Matthew Saras, P.A.   ANESTHESIA:  General.   OPERATIVE TIME:  50 minutes.   COMPLICATIONS:  None.   INDICATIONS FOR PROCEDURE:  Mr. Dicroce is a 77 year old gentleman who  has had 3 to 4 months of increasing right shoulder pain with exam and  MRI documenting a complete rotator cuff tear, biceps tendon rupture with  labrum tear and impingement, and AC joint arthropathy, who has failed  conservative care and is now to undergo arthroscopy.   DESCRIPTION:  Mr. Waggy was brought to the operating room on April 13, 2006, after an interscalene block was placed in the arm by  anesthesia.  He was placed on the operative table in supine position.  After being placed under general anesthesia, his right shoulder was  examined.  He has full range of motion and his shoulder was stable to  ligamentous exam.  He was then placed in a beach-chair position and his  shoulder and arm was prepped using sterile DuraPrep and draped using  sterile technique.  He received vancomycin 1 gram IV preoperatively for  prophylaxis.  The right shoulder and arm was prepped using sterile  DuraPrep and draped using sterile technique.   Initially, through a posterior arthroscopic portal, the arthroscope with  a pump attachment was placed into an anterior portal and arthroscopic  probe was placed.  On initial inspection, the articular cartilage in the  glenohumeral joint was then tagged.  Anterior labrum, superior labrum,  and posterior superior labrum showed partial tearing, 25 to 30%, which  was debrided.  The biceps tendon was completely ruptured with a small  stump in the superior aspect of the labrum and this was debrided.  Anterior-inferior labrum and anterior-inferior glenohumeral ligament  complex was intact.  Rotator cuff showed a complete tear of the  supraspinatus and partial tear of the infraspinatus and this was  partially debrided arthroscopically.  Subacromial space was entered and  a lateral arthroscopic portal was made.  Large amount of bursitis was  resected.  Impingement was noted and a subacromial decompression was  carried out, removing 6 mm of the outer surface of the anterior,  anterolateral, anteromedial acromion and CA ligament release carried out  as well.  The [facility] joint showed significant spurring and degenerative  changes and the distal 5 to 6 mm of clavicle was resected with a 6-mm  burr.  After this was done, through an accessory portal first, a side-to-  side #2 FiberWire suture was placed on the most medial aspect of the  rotator cuff tear and then tied down arthroscopically, thus securing the  medial edge of the rotator cuff  tear back into position.  Then an  Arthrex 5.5 mm suture anchor was placed in the greater tuberosity.  Each  of these sutures were passed sequentially through the anterior and  posterior aspects of the rotator cuff tear and then tied down  arthroscopically, thus further securing the entire middle and lateral  aspects of the cuff tear.  After this was done, a firm and tight repair  was achieved.  At this point, the shoulder was taken through a full  range of motion.  There was found to be no impingement on the repair.  At this point, it was felt that all pathology had been satisfactorily  addressed.  The instruments were removed.  Portals were closed with 3-0  nylon suture, sterile dressing, and a sling applied.  The patient  awakened and taken to the recovery room in stable condition.   FOLLOWUP CARE:  Mr. Penniman will be followed as an outpatient on Percocet  and Robaxin with early physical therapy.  See him back in the office in  a week for sutures out and followup.      Robert A. Noemi Chapel, M.D.  Electronically Signed     RAW/MEDQ  D:  04/13/2006  T:  04/14/2006  Job:  [id number]

## 2010-08-01 NOTE — H&P (Signed)
NAMEELVER, GIACONA                ACCOUNT NO.:  [id number]   MEDICAL RECORD NO.:  [id number]          PATIENT TYPE:  INP   LOCATION:  6524                         FACILITY:  [facility]   PHYSICIAN:  Hind I Elsaid, MD      DATE OF BIRTH:  06-28-1930   DATE OF ADMISSION:  04/25/2006  DATE OF DISCHARGE:                              HISTORY & PHYSICAL   PRIMARY CARE PHYSICIAN:  Dr. Delilah Shan   CHIEF COMPLAINT:  Fever with chills.   HISTORY OF PRESENT ILLNESS:  This is a 77 year old male with history of  hypertension recently has a right shoulder rotator cuff repair and  presented to [facility] with high grade fever to 104.  History  was obtained from the patient where he was seen yesterday in the  emergency room complaining of high grade fever with nausea,  palpitations, rhinorrhea and shortness of breath where a flu test was  done for type A and type B which came negative.  Patient was started on  Tamiflu despite a negative influenza test.  Patient today feels much  worse.  Fever spiked 104.2 associated with chills and sweating.  Generalized bout of weakness and myalgia.  The EMS found the patient to  have a fever with RVR.  Patient denies any chest pain.  Denies any  shortness of breath.  Denies any abdominal pain, nausea or vomiting.  Denies any headache.  Denies any onset of any deterioration of mental  status.  Denies any numbness or weakness of the extremities.   PAST MEDICAL HISTORY:  1. Positive for hypertension.  2. Hypercholesterolemia.  3. Rotator cuff tear, status post repair.   MEDICATIONS:  1. Allopurinol 100 mg p.o. daily.  2. Hydrochlorothiazide/lisinopril 1 tablet p.o. daily.  3. Tamiflu.  4. Tylenol #3 for fever.   PAST SURGICAL HISTORY:  Rotator cuff tear status post repair which was  done on April 13, 2006 which was done arthroscopically assisted  rotator cuff repair.   ALLERGIES:  No known drug allergies.   SOCIAL HISTORY:  Ex-smoker, quit more  than 16 years.  Ex-alcohol, quit  more than 16 years.  No IV drug uses.  He was married previously with  his wife.  He is a retired man.   PHYSICAL EXAMINATION:  VITAL SIGNS:  On examination in the emergency  room blood pressure 177/73.  Pulse rate 169.  Respiratory rate 20.  Temperature 104.2.  GENERAL:  Febrile.  Tachycardic.  Sweating.  Well-developed,well-  nourished, well-hydrated in no acute distress.  HEAD AND FACE:  Normocephalic, atraumatic.  EYES:  Normal appearance.  ENT:  Mouth and pharynx normal.  Neck supple.  CARDIOVASCULAR:  Irregular irregular heartbeat which is tachy.  RESPIRATORY:  Equal bilaterally.  ABDOMEN:  Soft, nontender, nondistended.  EXTREMITY:  No deformity.  Full range of motion.  No edema.  The right  upper extremity in a sling status post shoulder surgery.  No point  tenderness.  NEUROLOGICAL:  Alert and oriented x3 with no focal or neurological  finding.  SKIN:  Skin color normal.   EKG AND LAB:  EKG:  Atrial fibrillation with RVR.   Labs:  CBC white blood cell 7.1.  Hemoglobin 15.5.  Hematocrit 45.  Platelet 161.  Cardiac markers x1 negative.  Sodium 133.  Potassium 4.4.  Chloride 102.  Glucose 174.  BUN 90.   Chest x-ray was done on February 9.  Old granulomatous disease,  pulmonary hyperaeration, COPD, negative for pneumonia.   ASSESSMENT AND PLAN:  1. Febrile illness with temperature 104.  Chest x-ray does not      actually show any sign of pneumonia with repeat chest x-ray today.      We will repeat urinalysis culture if continue to febrile we will      consider lumbar puncture.  There is no evidence of meningitis or      encephalitis.  There is no evidence of shoulder infection although      the procedure done under arthroscopy.  We will start the patient on      Zithromax and Rocephin.  We will continue with Tamiflu.  We will      get the septic workup and further recommendations to be followed if      the fever continues.  2. A fever  with RVR most probably exacerbated by the febrile illness.      We will continued with the Cardizem drip and we will start the      patient on Cardizem p.o.  we will get CK-MB and troponin and 2D      echo and Lovenox as the patient's age and hypertension puts him at      the high scale for anticoagulation.  Cardiology consult to be      considered.  We will get TSH levels.  3. Hypertension.  Continue with the home medication.  DVT and GI      prophylaxis.      Hind Franco Collet, MD  Electronically Signed     HIE/MEDQ  D:  04/26/2006  T:  04/26/2006  Job:  [id number]

## 2010-08-01 NOTE — Assessment & Plan Note (Signed)
[place]                            CARDIOLOGY OFFICE NOTE   [name], [name]                       MRN:          [id number]  DATE:06/17/2006                            DOB:          1930-06-30    Nhat Leibman was seen in follow up at the [facility] on  June 17, 2006, as an outpatient. He is a 77 year old man with  hypertension and paroxysmal atrial fibrillation. Mr. Slaski has been  doing well from a symptomatic standpoint. He specifically denies any  chest pain, dyspnea, palpitations, lightheadedness, syncope, orthopnea,  PND or leg edema. His blood pressure control has been somewhat better on  his antihypertensive regimen. He brings in readings today that show a  wide range of blood pressures from 98 systolic up to a high of [id number] and  55 diastolic up to a high of 98. On average his blood pressure is in the  [id number] to Q000111Q Q000111Q diastolic.   CURRENT MEDICATIONS:  1. Coumadin as directed.  2. Cartia XT 240 mg daily.  3. Lisinopril 20 mg daily.  4. Metoprolol 25 mg twice daily.   ALLERGIES:  PENICILLIN.   PHYSICAL EXAMINATION:  He is alert and oriented in no acute distress.  Blood pressure is 140/80, heart rate is 59, respiratory rate is 16.  HEENT: Is normal.  NECK: Normal carotid upstrokes without bruits. Jugular venous pressure  is normal.  LUNGS:  Clear to auscultation bilaterally.  CARDIOVASCULAR:  The heart is regular rate and rhythm without murmurs or  gallops.  ABDOMEN: Soft, obese, and nontender. No organomegaly. No abdominal  bruits.  EXTREMITIES: No clubbing, cyanosis or edema. Peripheral pulses are 2+  and equal throughout.   ASSESSMENT:  Mr. Byram is currently stable from a cardiac standpoint.  His cardiovascular problems are as follows:  1. Atrial fibrillation. Mr. Apa remains in sinus rhythm. He has one      day left on his CardioNet device. I will review his tracings next      week when  available and if he has not had any further paroxysms of      atrial fibrillation over three week period I would be inclined to      discontinue his Coumadin. I think that his atrial fibrillation was      related to his febrile illness and that is may have represented an      isolated episode. If that is the case and he has been free of any      further paroxysms of atrial fibrillation then I would favor      continuing him on aspirin alone rather than warfarin.  2. Hypertension with borderline control. Will continue his current      therapy and leave further medication changes to Dr. Juventino Slovak. I had      initially placed him on hydrochlorothiazide, but learned that he      has had problems with gout in the past, so this medication was      discontinued.   For followup, I will touch base with Mr. Viteri by telephone after  I  review his CardioNet results and will give him instructions regarding  his anticoagulation at that time.     Juanda Bond. Burt Knack, MD  Electronically Signed    MDC/MedQ  DD: 06/17/2006  DT: 06/17/2006  Job #: [id number]   cc:   Nelda Severe. Juventino Slovak, M.D.

## 2010-08-01 NOTE — Consult Note (Signed)
[name], [name]                ACCOUNT NO.:  [id number]   MEDICAL RECORD NO.:  [id number]          PATIENT TYPE:  INP   LOCATION:  6533                         FACILITY:  [facility]   PHYSICIAN:  Juanda Bond. Burt Knack, MD  DATE OF BIRTH:  1930/11/07   DATE OF CONSULTATION:  DATE OF DISCHARGE:                                 CONSULTATION   REASON FOR CONSULTATION:  Atrial fibrillation with rapid ventricular  rate.   HISTORY OF PRESENT ILLNESS:  [name] is a very nice 77 year old  gentleman who was admitted with a febrile illness.  He has had very high  temperatures up to 105 degrees.  He initially presented to Dr. Scarlette Ar  office with chills.  In the setting of his high fever, he was sent for  admission to the hospital.  He has had not had any specific complaints.  At the time of admission, he was noted to be in atrial fib with rapid  ventricular rate.  He specifically denied chest pain, dyspnea,  palpitations, orthopnea, PND, light headedness or syncope.  He has had  no cough, urinary symptoms or other obvious source of his infection.   He has no history of atrial fib.  He has been seen regularly by  physicians in the past and has never been told of this diagnosis.  After  his hospital admission, he spontaneously converted to sinus rhythm.  However, he has reverted back into atrial fibrillation and is currently  on Cardizem for rate control.   CURRENT MEDICATIONS:  Include:  1. Aspirin 81 mg daily.  2. Reglan p.r.n.  3. Albuterol nebulizer every six hours.  4. Atrovent nebulizer every six hours.  5. Cardizem CD 120 mg daily.  6. Lovenox 80 mg every 12 hours.  7. Allopurinol 300 mg daily.  8. Prilosec 20 mg daily.  9. Zithromax 500 mg IV daily.  10.Rocephin 1 gm IV daily.   ALLERGIES:  PENICILLIN.   PAST MEDICAL HISTORY:  Pertinent for the following:  1. Essential hypertension x15 years.  2. Dyslipidemia.  3. Prior history of a cardiac catheterization and was told he  had      normal coronaries.  4. Gastroesophageal reflux disease.  5. Rotator cuff tear status post recent repair on January 29 of this      year.   SOCIAL HISTORY:  The patient lives locally with his wife.  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  He quit smoking cigarettes 16 years  ago, also quit drinking alcohol 16 years ago.   FAMILY HISTORY:  Negative for any coronary artery disease.   REVIEW OF SYSTEMS:  A complete review of systems was performed.  Pertinent positives included a right shoulder injury and pain as  described above as well as fevers.  All other systems were reviewed and  are negative except as detailed above.   PHYSICAL EXAMINATION:  GENERAL:  The patient is alert and oriented.  He  is in no acute distress.  VITAL SIGNS:  Current temperature is 100.1, heart rates ranging between  100 and 120, respiratory rate is  23, blood pressure 122/53, oxygen  saturations 97% on room air.  HEENT:  Normal.  NECK:  Normal carotid upstrokes without bruits.  Jugular venous pressure  is normal.  There is no thyromegaly or thyroid nodules.  LYMPHATICS:  No adenopathy.  CARDIOVASCULAR:  The apex is discrete.  The heart is irregularly  irregular without murmurs or gallops.  ABDOMEN:  Soft, nontender, no organomegaly, no abdominal bruits.  The  abdomen is obese.  LUNGS:  Clear to auscultation bilaterally.  SKIN:  There is no rash or lesions.  EXTREMITIES:  No clubbing, cyanosis or edema.  Peripheral pulses are 2+  and equal throughout.  MUSCULOSKELETAL:  There are no joint deformities or effusions.  There is  no spine or CVA tenderness.  NEUROLOGIC:  Alert and oriented in all spheres.  Cranial nerves II-XII  are grossly intact.  Strength is 5/5 and equal in the arms and legs  bilaterally.   Chest x-ray showed no acute disease, but bilateral calcified granulomas  were noted.   Echocardiogram has been done, but the results are pending.   Electrocardiogram  demonstrates atrial fibrillation with rapid  ventricular response.  Otherwise, there are no acute ST segment or T  wave changes.   TSH is normal and cardiac biomarkers are negative times multiple sets.   ASSESSMENT:  This is a 77 year old gentleman with newly diagnosed atrial  fibrillation with rapid ventricular response.  This is with a background  of essential hypertension and in the acute setting of a febrile illness.  His atrial fibrillation duration is unknown, but is likely of relatively  new onset and is also likely to be related to his acute illness.  Will  plan on awaiting for his echo results.  As detailed above, his TSH and  cardiac markers are negative.  I would recommend adding a beta-blocker  to his medical regimen, as his atrial fibrillation is likely  catecholamine mediated.  Will write for metoprolol 50 mg twice daily.  Will await to see if he converts to normal sinus rhythm before making a  decision regarding the need for long-term anticoagulation.   Thank you for this consultation.  I will follow [name] during his  hospitalization and will follow up on his echo results as well as his  rate control.  Please feel free to call at any time with questions  regarding his care.      Juanda Bond. Burt Knack, MD  Electronically Signed    MDC/MEDQ  D:  04/27/2006  T:  04/28/2006  Job:  [id number]   cc:   Vickki Hearing, M.D.

## 2010-08-14 ENCOUNTER — Encounter: Payer: Self-pay | Admitting: *Deleted

## 2010-08-19 ENCOUNTER — Encounter: Payer: Self-pay | Admitting: Cardiovascular Disease

## 2010-08-19 ENCOUNTER — Ambulatory Visit (INDEPENDENT_AMBULATORY_CARE_PROVIDER_SITE_OTHER): Payer: Medicare Other | Admitting: Cardiovascular Disease

## 2010-08-19 VITALS — BP 120/70 | HR 58 | Ht 66.0 in | Wt 197.0 lb

## 2010-08-19 DIAGNOSIS — I10 Essential (primary) hypertension: Secondary | ICD-10-CM

## 2010-08-19 DIAGNOSIS — I4891 Unspecified atrial fibrillation: Secondary | ICD-10-CM

## 2010-08-19 NOTE — Patient Instructions (Signed)
Your physician wants you to follow-up in: 6 MONTHS You will receive a reminder letter in the mail two months in advance. If you don't receive a letter, please call our office to schedule the follow-up appointment. 

## 2010-08-21 ENCOUNTER — Encounter: Payer: Self-pay | Admitting: Cardiovascular Disease

## 2010-08-21 NOTE — Assessment & Plan Note (Signed)
The patient is in sinus rhythm at the present time. He is tolerating anticoagulation with warfarin. We'll continue current medical program which includes AV nodal blockade with diltiazem and metoprolol.

## 2010-08-21 NOTE — Progress Notes (Signed)
HPI:  This is a 77 year old gentleman presenting for followup of paroxysmal atrial fibrillation and nonobstructive coronary artery disease. The patient only complains of bilateral foot numbness and pain, specifically in his toes and distal foot area. He denies calf pain with ambulation. He denies ulceration or redness of the feet. He denies chest pain, dyspnea, palpitations, lightheadedness, or syncope. He has no other complaints at this time.  Outpatient Encounter Prescriptions as of 08/19/2010  Medication Sig Dispense Refill  . allopurinol (ZYLOPRIM) 300 MG tablet Take 300 mg by mouth daily.        [name] diltiazem (CARDIZEM CD) 120 MG 24 hr capsule TAKE 1 CAPSULE EVERY DAY  30 capsule  5  . lisinopril (PRINIVIL,ZESTRIL) 20 MG tablet Take 20 mg by mouth daily.        . metoprolol tartrate (LOPRESSOR) 25 MG tablet Take 25 mg by mouth 2 (two) times daily.        [name] omeprazole (PRILOSEC OTC) 20 MG tablet Take 20 mg by mouth as needed.        . warfarin (COUMADIN) 4 MG tablet Take 1 tablet (4 mg total) by mouth as directed.  45 tablet  3  . DISCONTD: LORazepam (ATIVAN) 1 MG tablet Take 1 mg by mouth every 6 (six) hours as needed.          Allergies  Allergen Reactions  . Penicillins     Past Medical History  Diagnosis Date  . Atrial fibrillation   . Hypertension   . Coronary artery disease   . Esophageal reflux   . Gout, unspecified   . Obesity, unspecified   . Paroxysmal nocturnal dyspnea   . Orthopnea   . Dyslipidemia   . Diverticulosis   . Vertigo     ROS: Negative except as per HPI  BP 120/70  Pulse 58  Ht 5\' 6"  (1.676 m)  Wt 197 lb (89.359 kg)  BMI 31.80 kg/m2  PHYSICAL EXAM: Pt is alert and oriented,obese male in NAD HEENT: normal Neck: JVP - normal, carotids 2+= without bruits Lungs: CTA bilaterally CV: RRR without murmur or gallop Abd: soft, NT, Positive BS, obese Ext: no C/C/E, distal pulses intact and equal Skin: warm/dry no rash  EKG:  Sinus bradycardia 58 beats  per minute, within normal limits.  ASSESSMENT AND PLAN:

## 2010-08-21 NOTE — Assessment & Plan Note (Signed)
Controlled on current medications 

## 2010-08-27 ENCOUNTER — Ambulatory Visit (INDEPENDENT_AMBULATORY_CARE_PROVIDER_SITE_OTHER): Payer: Medicare Other | Admitting: *Deleted

## 2010-08-27 DIAGNOSIS — I4891 Unspecified atrial fibrillation: Secondary | ICD-10-CM

## 2010-09-22 ENCOUNTER — Encounter: Payer: Self-pay | Admitting: Family Medicine

## 2010-09-22 DIAGNOSIS — G4733 Obstructive sleep apnea (adult) (pediatric): Secondary | ICD-10-CM | POA: Insufficient documentation

## 2010-09-22 DIAGNOSIS — Z9989 Dependence on other enabling machines and devices: Secondary | ICD-10-CM | POA: Insufficient documentation

## 2010-09-24 ENCOUNTER — Ambulatory Visit (INDEPENDENT_AMBULATORY_CARE_PROVIDER_SITE_OTHER): Payer: Medicare Other | Admitting: *Deleted

## 2010-09-24 DIAGNOSIS — I4891 Unspecified atrial fibrillation: Secondary | ICD-10-CM

## 2010-10-22 ENCOUNTER — Ambulatory Visit (INDEPENDENT_AMBULATORY_CARE_PROVIDER_SITE_OTHER): Payer: Medicare Other | Admitting: *Deleted

## 2010-10-22 DIAGNOSIS — I4891 Unspecified atrial fibrillation: Secondary | ICD-10-CM

## 2010-10-22 LAB — POCT INR: INR: 1.3

## 2010-11-05 ENCOUNTER — Ambulatory Visit (INDEPENDENT_AMBULATORY_CARE_PROVIDER_SITE_OTHER): Payer: Medicare Other | Admitting: *Deleted

## 2010-11-05 DIAGNOSIS — I4891 Unspecified atrial fibrillation: Secondary | ICD-10-CM

## 2010-11-20 ENCOUNTER — Other Ambulatory Visit: Payer: Self-pay | Admitting: Cardiovascular Disease

## 2010-12-03 ENCOUNTER — Ambulatory Visit (INDEPENDENT_AMBULATORY_CARE_PROVIDER_SITE_OTHER): Payer: Medicare Other | Admitting: *Deleted

## 2010-12-03 DIAGNOSIS — I4891 Unspecified atrial fibrillation: Secondary | ICD-10-CM

## 2010-12-10 ENCOUNTER — Telehealth: Payer: Self-pay | Admitting: *Deleted

## 2010-12-10 NOTE — Telephone Encounter (Signed)
Pt having gum surgery, has questions about when to discontinue his coumadin.  Please call and advise. Would like to stop this week-end. [phone] cell number to reach him

## 2010-12-10 NOTE — Telephone Encounter (Signed)
Sounds fine. Thanks Gay Filler.

## 2010-12-10 NOTE — Telephone Encounter (Signed)
Pt having tooth pulled and needs INR ~2 per dentist.  He wants to hold Coumadin x 2 days over weekend and check INR on Monday to have tooth pulled on Tuesday. Will forward to Dr. Burt Knack for approval.  Pt states he is going to do this whether we approve or not.

## 2010-12-12 NOTE — Telephone Encounter (Signed)
Pt aware.  Will hold Coumadin over weekend.  Check INR on Monday for procedure on Tuesday.

## 2010-12-15 ENCOUNTER — Ambulatory Visit (INDEPENDENT_AMBULATORY_CARE_PROVIDER_SITE_OTHER): Payer: Medicare Other | Admitting: *Deleted

## 2010-12-15 DIAGNOSIS — I4891 Unspecified atrial fibrillation: Secondary | ICD-10-CM

## 2010-12-27 ENCOUNTER — Other Ambulatory Visit: Payer: Self-pay | Admitting: Cardiovascular Disease

## 2010-12-31 ENCOUNTER — Ambulatory Visit (INDEPENDENT_AMBULATORY_CARE_PROVIDER_SITE_OTHER): Payer: Medicare Other | Admitting: *Deleted

## 2010-12-31 DIAGNOSIS — I4891 Unspecified atrial fibrillation: Secondary | ICD-10-CM

## 2010-12-31 LAB — POCT INR: INR: 2.1

## 2011-01-25 ENCOUNTER — Other Ambulatory Visit: Payer: Self-pay | Admitting: Cardiovascular Disease

## 2011-01-28 ENCOUNTER — Ambulatory Visit (INDEPENDENT_AMBULATORY_CARE_PROVIDER_SITE_OTHER): Payer: Medicare Other | Admitting: *Deleted

## 2011-01-28 DIAGNOSIS — I4891 Unspecified atrial fibrillation: Secondary | ICD-10-CM

## 2011-01-28 LAB — POCT INR: INR: 2.8

## 2011-02-16 ENCOUNTER — Ambulatory Visit (INDEPENDENT_AMBULATORY_CARE_PROVIDER_SITE_OTHER): Payer: Medicare Other | Admitting: Cardiovascular Disease

## 2011-02-16 ENCOUNTER — Encounter: Payer: Self-pay | Admitting: Cardiovascular Disease

## 2011-02-16 DIAGNOSIS — I4891 Unspecified atrial fibrillation: Secondary | ICD-10-CM

## 2011-02-16 DIAGNOSIS — I10 Essential (primary) hypertension: Secondary | ICD-10-CM

## 2011-02-16 DIAGNOSIS — R079 Chest pain, unspecified: Secondary | ICD-10-CM | POA: Insufficient documentation

## 2011-02-16 NOTE — Assessment & Plan Note (Signed)
He is on a combination of lisinopril, metoprolol, and diltiazem. He brings in home readings showing average systolic blood pressures in the 120s to 130s with average diastolic pressures in the 60s and 70s. We'll continue his current medical program.

## 2011-02-16 NOTE — Patient Instructions (Signed)
Your physician wants you to follow-up in: 1 YEAR.  You will receive a reminder letter in the mail two months in advance. If you don't receive a letter, please call our office to schedule the follow-up appointment.  Your physician recommends that you continue on your current medications as directed. Please refer to the Current Medication list given to you today.  

## 2011-02-16 NOTE — Assessment & Plan Note (Signed)
The patient remains in sinus rhythm. He will continue anticoagulation with warfarin. I'll see him back in one year for followup.

## 2011-02-16 NOTE — Assessment & Plan Note (Signed)
The patient has had chest pain since an episode of food impaction. I suspect this is related to an esophageal etiology. He had minimal coronary disease at the time of his catheterization. I recommended that he followup with his primary care physician or gastroenterologist.

## 2011-02-16 NOTE — Progress Notes (Signed)
HPI:  77 year old gentleman presenting for follow up evaluation. The patient is followed for paroxysmal atrial fibrillation and nonobstructive CAD.  The patient has been doing well overall. He "choked on some food" a few weeks back and his been experiencing some chest discomfort since then. The pain is nonexertional. He's had no further dysphasia. He has trouble describing the pain but states that it is not a pressure or a sharp pain. It can occur on either side of the chest and it is unpredictable. He has not experienced this in the past. It is not affected by eating. He denies dyspnea, palpitations, or edema. He has no other complaints.  Outpatient Encounter Prescriptions as of 02/16/2011  Medication Sig Dispense Refill  . allopurinol (ZYLOPRIM) 300 MG tablet Take 300 mg by mouth daily.        [name] diltiazem (CARDIZEM CD) 120 MG 24 hr capsule TAKE 1 CAPSULE EVERY DAY  30 capsule  5  . lisinopril (PRINIVIL,ZESTRIL) 20 MG tablet TAKE 1 TABLET EVERY DAY  30 tablet  9  . metoprolol tartrate (LOPRESSOR) 25 MG tablet TAKE ONE TABLET BY MOUTH TWICE A DAY  60 tablet  8  . omeprazole (PRILOSEC OTC) 20 MG tablet Take 20 mg by mouth as needed.        . warfarin (COUMADIN) 4 MG tablet TAKE 1 TABLET BY MOUTH DAILY AS DIRECTED  45 tablet  3    Allergies  Allergen Reactions  . Penicillins     Past Medical History  Diagnosis Date  . Atrial fibrillation   . Hypertension   . Coronary artery disease   . Esophageal reflux   . Gout, unspecified   . Obesity, unspecified   . Paroxysmal nocturnal dyspnea   . Orthopnea   . Dyslipidemia   . Diverticulosis   . Vertigo     ROS: Negative except as per HPI  BP 144/76  Pulse 68  Ht 5\' 6"  (1.676 m)  Wt 91.173 kg (201 lb)  BMI 32.44 kg/m2  PHYSICAL EXAM: Pt is alert and oriented, obese male in NAD HEENT: normal Neck: JVP - normal, carotids 2+= without bruits Lungs: CTA bilaterally CV: RRR without murmur or gallop Abd: soft, NT, Positive BS, obese Ext: no  C/C/E, distal pulses intact and equal Skin: warm/dry no rash  EKG:  Normal sinus rhythm 69 beats per minute, within normal limits.  ASSESSMENT AND PLAN:

## 2011-02-25 ENCOUNTER — Ambulatory Visit (INDEPENDENT_AMBULATORY_CARE_PROVIDER_SITE_OTHER): Payer: Medicare Other | Admitting: *Deleted

## 2011-02-25 DIAGNOSIS — I4891 Unspecified atrial fibrillation: Secondary | ICD-10-CM

## 2011-04-02 ENCOUNTER — Other Ambulatory Visit: Payer: Self-pay | Admitting: Family Medicine

## 2011-04-02 DIAGNOSIS — I4891 Unspecified atrial fibrillation: Secondary | ICD-10-CM

## 2011-04-02 DIAGNOSIS — E7800 Pure hypercholesterolemia, unspecified: Secondary | ICD-10-CM

## 2011-04-03 ENCOUNTER — Other Ambulatory Visit (INDEPENDENT_AMBULATORY_CARE_PROVIDER_SITE_OTHER): Payer: Medicare Other

## 2011-04-03 DIAGNOSIS — M109 Gout, unspecified: Secondary | ICD-10-CM | POA: Diagnosis not present

## 2011-04-03 DIAGNOSIS — I4891 Unspecified atrial fibrillation: Secondary | ICD-10-CM

## 2011-04-03 DIAGNOSIS — E7800 Pure hypercholesterolemia, unspecified: Secondary | ICD-10-CM | POA: Diagnosis not present

## 2011-04-03 LAB — COMPREHENSIVE METABOLIC PANEL
Alkaline Phosphatase: 71 U/L (ref 39–117)
BUN: 21 mg/dL (ref 6–23)
CO2: 26 mEq/L (ref 19–32)
Creatinine, Ser: 1.3 mg/dL (ref 0.4–1.5)
GFR: 56.71 mL/min — ABNORMAL LOW (ref >60.00)
Glucose, Bld: 98 mg/dL (ref 70–99)
Total Bilirubin: 0.6 mg/dL (ref 0.3–1.2)
Total Protein: 7.5 g/dL (ref 6.0–8.3)

## 2011-04-03 LAB — TSH: TSH: 1.27 u[IU]/mL (ref 0.35–5.50)

## 2011-04-03 LAB — CBC WITH DIFFERENTIAL/PLATELET
Basophils Relative: 0.5 % (ref 0.0–3.0)
Eosinophils Relative: 2.1 % (ref 0.0–5.0)
HCT: 46.5 % (ref 39.0–52.0)
Lymphs Abs: 1.5 10*3/uL (ref 0.7–4.0)
MCV: 88.9 fl (ref 78.0–100.0)
Monocytes Absolute: 0.6 10*3/uL (ref 0.1–1.0)
Monocytes Relative: 10.5 % (ref 3.0–12.0)
Platelets: 162 10*3/uL (ref 150.0–400.0)
RBC: 5.23 Mil/uL (ref 4.22–5.81)
WBC: 5.5 10*3/uL (ref 4.5–10.5)

## 2011-04-03 LAB — URIC ACID: Uric Acid, Serum: 8.2 mg/dL — ABNORMAL HIGH (ref 4.0–7.8)

## 2011-04-03 LAB — LIPID PANEL
Cholesterol: 206 mg/dL — ABNORMAL HIGH (ref 0–200)
HDL: 32.9 mg/dL — ABNORMAL LOW (ref >39.00)
Triglycerides: 252 mg/dL — ABNORMAL HIGH (ref 0.0–149.0)

## 2011-04-08 ENCOUNTER — Ambulatory Visit (INDEPENDENT_AMBULATORY_CARE_PROVIDER_SITE_OTHER): Payer: Medicare Other | Admitting: Pharmacist

## 2011-04-08 DIAGNOSIS — I4891 Unspecified atrial fibrillation: Secondary | ICD-10-CM

## 2011-04-08 LAB — POCT INR: INR: 2.8

## 2011-04-10 ENCOUNTER — Encounter: Payer: Medicare Other | Admitting: Family Medicine

## 2011-04-20 ENCOUNTER — Ambulatory Visit (INDEPENDENT_AMBULATORY_CARE_PROVIDER_SITE_OTHER): Payer: Medicare Other | Admitting: Family Medicine

## 2011-04-20 ENCOUNTER — Encounter: Payer: Self-pay | Admitting: Family Medicine

## 2011-04-20 DIAGNOSIS — K219 Gastro-esophageal reflux disease without esophagitis: Secondary | ICD-10-CM

## 2011-04-20 DIAGNOSIS — E669 Obesity, unspecified: Secondary | ICD-10-CM | POA: Diagnosis not present

## 2011-04-20 DIAGNOSIS — Z7189 Other specified counseling: Secondary | ICD-10-CM | POA: Insufficient documentation

## 2011-04-20 DIAGNOSIS — G4733 Obstructive sleep apnea (adult) (pediatric): Secondary | ICD-10-CM

## 2011-04-20 DIAGNOSIS — I10 Essential (primary) hypertension: Secondary | ICD-10-CM | POA: Diagnosis not present

## 2011-04-20 DIAGNOSIS — M109 Gout, unspecified: Secondary | ICD-10-CM

## 2011-04-20 DIAGNOSIS — I4891 Unspecified atrial fibrillation: Secondary | ICD-10-CM

## 2011-04-20 DIAGNOSIS — E7800 Pure hypercholesterolemia, unspecified: Secondary | ICD-10-CM | POA: Diagnosis not present

## 2011-04-20 DIAGNOSIS — Z9989 Dependence on other enabling machines and devices: Secondary | ICD-10-CM

## 2011-04-20 MED ORDER — ALLOPURINOL 300 MG PO TABS: 300.0000 mg | ORAL_TABLET | Freq: Every day | ORAL | Status: DC

## 2011-04-20 MED ORDER — COLCHICINE 0.6 MG PO TABS: 0.6000 mg | ORAL_TABLET | Freq: Two times a day (BID) | ORAL | Status: DC | PRN

## 2011-04-20 NOTE — Progress Notes (Signed)
F/u for chronic medical problems:  Hypertension:    Using medication without problems or lightheadedness: yes Chest pain with exertion:no Edema:no Short of breath:no Average home [id number] at home  AF: no palpations, no sx.  No CP, not sob. Compliant with meds, coumadin and BP meds.  Recently seen by cards.    Gout:  occ flare, not taking allopurinol consistently.  Pain in toes.  D/w pt about diet.    CPAP: for OSA, compliant.  Obese, has gained weight.   Prostate cancer screening.  PSA options were discussed along with recent recs.  No indication for psa at this point, since patient is low risk and there is no FH of prosate CA.  He declined testing of PSA.  Choking episode a few weeks ago when eating chicken.  No episodes before or since then.  Not SOB, no CP.  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  S/p esophageal dilation years ago by GI.  Known GERD.  On PPI.   Meds, vitals, and allergies reviewed.   PMH and SH reviewed  ROS: See HPI.  Otherwise negative.    GEN: nad, alert and oriented, obese HEENT: mucous membranes moist NECK: supple w/o LA CV: rrr. No ectopy today PULM: ctab, no inc wob ABD: soft, +bs EXT: no edema SKIN: no acute rash but mild erythema on 1st MCP B.   Labs d/w pt.

## 2011-04-20 NOTE — Assessment & Plan Note (Signed)
d/w pt about losing weight.

## 2011-04-20 NOTE — Assessment & Plan Note (Signed)
Refer back to GI if sx continue.

## 2011-04-20 NOTE — Assessment & Plan Note (Signed)
Continue cpap, d/w pt about losing weight.

## 2011-04-20 NOTE — Assessment & Plan Note (Signed)
He'll discuss with family.

## 2011-04-20 NOTE — Patient Instructions (Signed)
Take the colchicine as needed. When you have no gout symptoms for a few days, start back on the allopurinol.  Take care.  Don't change your other meds.  Glad to see you.

## 2011-04-20 NOTE — Assessment & Plan Note (Signed)
Sounds to be in NSR today 

## 2011-04-20 NOTE — Assessment & Plan Note (Signed)
No meds, need to lose weight with diet/exercise.

## 2011-04-20 NOTE — Assessment & Plan Note (Signed)
Work on diet, restart allopurinol after flare resolved.

## 2011-04-20 NOTE — Assessment & Plan Note (Signed)
Cont current meds, d/w pt about losing weight.

## 2011-04-21 DIAGNOSIS — H269 Unspecified cataract: Secondary | ICD-10-CM | POA: Diagnosis not present

## 2011-05-05 ENCOUNTER — Ambulatory Visit (INDEPENDENT_AMBULATORY_CARE_PROVIDER_SITE_OTHER): Payer: Medicare Other | Admitting: Family Medicine

## 2011-05-05 ENCOUNTER — Encounter: Payer: Self-pay | Admitting: Family Medicine

## 2011-05-05 VITALS — BP 110/70 | HR 98 | Temp 98.7°F | Wt 200.0 lb

## 2011-05-05 DIAGNOSIS — I4891 Unspecified atrial fibrillation: Secondary | ICD-10-CM | POA: Diagnosis not present

## 2011-05-05 DIAGNOSIS — J40 Bronchitis, not specified as acute or chronic: Secondary | ICD-10-CM | POA: Diagnosis not present

## 2011-05-05 MED ORDER — HYDROCODONE-HOMATROPINE 5-1.5 MG/5ML PO SYRP: 5.0000 mL | ORAL_SOLUTION | Freq: Every evening | ORAL | Status: AC | PRN

## 2011-05-05 MED ORDER — DOXYCYCLINE HYCLATE 100 MG PO CAPS: 100.0000 mg | ORAL_CAPSULE | Freq: Two times a day (BID) | ORAL | Status: AC

## 2011-05-05 NOTE — Patient Instructions (Addendum)
Sounds like you are in atrial fibrillation today, anticipate due to bronchitis. Treat with doxycycline twice daily for 10 days as well as refilled hydrocodone cough syrup. May use simple mucinex with plenty of fluid to mobilize mucous. Push fluids and rest. Please let us know if not improving as expected, fever >101.5, or worsening productive cough - we may want to see you again. I'd like you to return in 2 weeks for follow up with Dr. Damita Dunnings or see Dr. Burt Knack for recheck of heart. Good to see you today, I hope you start feeling better soon! If at any point worsening shortness of breath, dizziness, or chest pain, please be evaulated urgently.

## 2011-05-05 NOTE — Progress Notes (Addendum)
Subjective:    Patient ID: [name], male    DOB: October 15, 1930, 78 y.o.   MRN: [id number]  HPI CC: cough  04/21/2011 started feeling ill.  Out of work since then.  sxs going on over 2 weeks.  Cough productive of green/yellow sputum, worse at night.  some chest tightness with cough.  + sinus drainage.  + ST and nasal congestion.  + HA from cough.  Last night with chills.  Has been using left over hydrocodone cough syrup.  Would like refill as helping.  No fevers, appetite changes, abd pain, n/v, ear pain or tooth pain.  no h/o PNA.  No h/o asthma, COPD.  Wife smokes in home.  No sick contacts at home.  H/o paroxysmal Afib last seen by cards 02/2011.  On coumadin.  On diltiazem and metoprolol.  Denies dizziness, chest pain, worsening shortness of breath.  Does not recall recently falling out of sinus rhythm.  Denies palpitations or racing heart.  Compliant with meds. Lab Results  Component Value Date   INR 2.8 04/08/2011   INR 2.3 02/25/2011   INR 2.8 01/28/2011   Medications and allergies reviewed and updated in chart.  Past histories reviewed and updated if relevant as below. Patient Active Problem List  Diagnoses  . HYPERCHOLESTEROLEMIA  . GOUT, UNSPECIFIED  . OBESITY  . HYPERTENSION  . CAD  . Atrial fibrillation  . GASTROESOPHAGEAL REFLUX DISEASE  . ORTHOPNEA  . PAROXYSMAL NOCTURNAL DYSPNEA  . OSA on CPAP  . Chest pain  . Advance directive discussed with patient  . Bronchitis   Past Medical History  Diagnosis Date  . Atrial fibrillation   . Hypertension   . Coronary artery disease   . Esophageal reflux   . Gout, unspecified   . Obesity, unspecified   . Paroxysmal nocturnal dyspnea   . Orthopnea   . Diverticulosis   . Vertigo   . Hyperlipidemia   . OSA (obstructive sleep apnea)     CPAP at night   Past Surgical History  Procedure Date  . Rotator cuff repair 2008    right  . Osa sleep study     per Dr. Brett Fairy  . Carotid dopplers 09/2009    no sig  extracranial stenosis and vertebral arteries had antegrade flow  . Mri of brain 09/2009    chronic microvascular ischemia   History  Substance Use Topics  . Smoking status: Former Smoker -- 4 years    Types: Cigarettes    Quit date: 03/16/1969  . Smokeless tobacco: Not on file   Comment: 1 pack/week for about 4 years, quit 1971  . Alcohol Use: No     Quit all alcohol in 1970's   Family History  Problem Relation Age of Onset  . Cancer Mother     died in 31's stomach cancer  . Hypertension Father     died in his 58's pna likely dementia  . Dementia Father   . Pneumonia Father   . Heart disease Brother     S/P CABG  . Hypertension Other   . Prostate cancer Neg Hx    Allergies  Allergen Reactions  . Penicillins    Current Outpatient Prescriptions on File Prior to Visit  Medication Sig Dispense Refill  . allopurinol (ZYLOPRIM) 300 MG tablet Take 1 tablet (300 mg total) by mouth daily.  90 tablet  3  . colchicine 0.6 MG tablet Take 1 tablet (0.6 mg total) by mouth 2 (two) times daily as  needed.  60 tablet  12  . diltiazem (CARDIZEM CD) 120 MG 24 hr capsule TAKE 1 CAPSULE EVERY DAY  30 capsule  5  . lisinopril (PRINIVIL,ZESTRIL) 20 MG tablet TAKE 1 TABLET EVERY DAY  30 tablet  9  . metoprolol tartrate (LOPRESSOR) 25 MG tablet Take 12.5 mg by mouth 2 (two) times daily.       [name] omeprazole (PRILOSEC OTC) 20 MG tablet Take 20 mg by mouth as needed.        . warfarin (COUMADIN) 4 MG tablet TAKE 1 TABLET BY MOUTH DAILY AS DIRECTED  45 tablet  3    Review of Systems per HPI    Objective:   Physical Exam  Nursing note and vitals reviewed. Constitutional: He appears well-developed and well-nourished. No distress.       Mild congestion  HENT:  Head: Normocephalic and atraumatic.  Right Ear: Hearing, tympanic membrane, external ear and ear canal normal.  Left Ear: Hearing, tympanic membrane, external ear and ear canal normal.  Nose: Nose normal. No mucosal edema or rhinorrhea. Right  sinus exhibits no maxillary sinus tenderness and no frontal sinus tenderness. Left sinus exhibits no maxillary sinus tenderness and no frontal sinus tenderness.  Mouth/Throat: Uvula is midline, oropharynx is clear and moist and mucous membranes are normal. No oropharyngeal exudate, posterior oropharyngeal edema, posterior oropharyngeal erythema or tonsillar abscesses.  Eyes: Conjunctivae and EOM are normal. Pupils are equal, round, and reactive to light. No scleral icterus.  Neck: Normal range of motion. Neck supple. No thyromegaly present.  Cardiovascular: Normal heart sounds and intact distal pulses.  An irregularly irregular rhythm present.  No murmur heard.      Manual pulse - 96, irreg irreg  Pulmonary/Chest: Effort normal and breath sounds normal. No respiratory distress. He has no wheezes. He has no rales.  Lymphadenopathy:    He has no cervical adenopathy.  Skin: Skin is warm and dry. No rash noted.       Assessment & Plan:

## 2011-05-05 NOTE — Assessment & Plan Note (Addendum)
sxs going on 2 weeks. Nontoxic, good O2 sat. Will cover for bacterial with doxycycline.  Refilled hydrocodone.  Avoid zpack 2/2 rhythm issues. Update [place] if sxs not improving as expected.

## 2011-05-05 NOTE — Assessment & Plan Note (Addendum)
Touched base with PCP.   History of paroxysmal afib but always NSR at previous OV including recent one with cards. Irregularly irregular today.  EKG today to document rhythm - afib with rate 103.  No acute ST/T changes. Pt does not recall falling out of sinus rhythm, seems to be tolerating well.  Already on blood thinners with last INR 2.8, already on appropriate rate controlling meds. Anticipate due to recent respiratory illness. Will treat respiratory illness and reassess.  Discussed rtc 2 wks for recheck and if has not self converted consider return to cards Discussed reasons to seek urgent care in interim.

## 2011-05-19 ENCOUNTER — Ambulatory Visit (INDEPENDENT_AMBULATORY_CARE_PROVIDER_SITE_OTHER): Payer: Medicare Other | Admitting: Family Medicine

## 2011-05-19 ENCOUNTER — Encounter: Payer: Self-pay | Admitting: Family Medicine

## 2011-05-19 ENCOUNTER — Encounter: Payer: Self-pay | Admitting: Gastroenterology

## 2011-05-19 DIAGNOSIS — I4891 Unspecified atrial fibrillation: Secondary | ICD-10-CM

## 2011-05-19 DIAGNOSIS — R1310 Dysphagia, unspecified: Secondary | ICD-10-CM

## 2011-05-19 DIAGNOSIS — K219 Gastro-esophageal reflux disease without esophagitis: Secondary | ICD-10-CM

## 2011-05-19 NOTE — Patient Instructions (Signed)
See Rosaria Ferries about your referral before you leave today. I would take plain claritin (not claritin D) for the congestion.   This should get better.  If you notice your heart racing, notify us.

## 2011-05-19 NOTE — Progress Notes (Signed)
His cold sx is some better, he still has some nocturnal post nasal gtt that is causing cough.  Does okay during the day. Intolerant of doxy with GI upset.  He couldn't feel episode of likely AF at last OV.  Has been working w/o chest pain, SOB, syncope.    Still has a feeling of food sticking, at the lower sternum. Worse with meats.  No vomiting. Prev was stretched by GI.   Meds, vitals, and allergies reviewed.   ROS: See HPI.  Otherwise, noncontributory.  GEN: nad, alert and oriented HEENT: mucous membranes moist, tm w/o erythema, nasal exam w/o erythema, some clear discharge noted,  OP with mild cobblestoning NECK: supple w/o LA CV: rrr.  No ectopy PULM: ctab, no inc wob EXT: no edema SKIN: no acute rash

## 2011-05-20 ENCOUNTER — Ambulatory Visit (INDEPENDENT_AMBULATORY_CARE_PROVIDER_SITE_OTHER): Payer: Medicare Other | Admitting: *Deleted

## 2011-05-20 DIAGNOSIS — I4891 Unspecified atrial fibrillation: Secondary | ICD-10-CM | POA: Diagnosis not present

## 2011-05-20 LAB — POCT INR: INR: 2.6

## 2011-05-20 NOTE — Assessment & Plan Note (Signed)
Sounds to NSR today.  He'll periodically check pulse and notify clinic if irregular or if he has sx.  Okay for outpatient f/u.  Already anticoagulated. Likely triggered by URI prev. Supportive tx for cold sx now, nontoxic, f/u prn.  Avoid decongestants.

## 2011-05-20 NOTE — Assessment & Plan Note (Signed)
With dysphagia,  will refer back to GI.

## 2011-05-21 ENCOUNTER — Other Ambulatory Visit: Payer: Self-pay | Admitting: Cardiovascular Disease

## 2011-05-27 DIAGNOSIS — J0100 Acute maxillary sinusitis, unspecified: Secondary | ICD-10-CM | POA: Diagnosis not present

## 2011-06-08 ENCOUNTER — Encounter: Payer: Self-pay | Admitting: Gastroenterology

## 2011-06-08 ENCOUNTER — Telehealth: Payer: Self-pay | Admitting: Cardiovascular Disease

## 2011-06-08 ENCOUNTER — Ambulatory Visit (INDEPENDENT_AMBULATORY_CARE_PROVIDER_SITE_OTHER): Payer: Medicare Other | Admitting: Gastroenterology

## 2011-06-08 VITALS — BP 134/68 | HR 60 | Ht 66.0 in | Wt 197.0 lb

## 2011-06-08 DIAGNOSIS — R1310 Dysphagia, unspecified: Secondary | ICD-10-CM

## 2011-06-08 DIAGNOSIS — Z7901 Long term (current) use of anticoagulants: Secondary | ICD-10-CM | POA: Diagnosis not present

## 2011-06-08 DIAGNOSIS — K222 Esophageal obstruction: Secondary | ICD-10-CM | POA: Insufficient documentation

## 2011-06-08 DIAGNOSIS — Z5181 Encounter for therapeutic drug level monitoring: Secondary | ICD-10-CM

## 2011-06-08 HISTORY — DX: Esophageal obstruction: K22.2

## 2011-06-08 NOTE — Telephone Encounter (Signed)
New Msg: Pt calling wanting to speak with MD to get approval to stop taking coumadin 5 days prior to pt procedure on April 2nd to address pt difficulty swallowing. Please return pt call to discuss further.

## 2011-06-08 NOTE — Assessment & Plan Note (Signed)
With history of esophageal stricture he likely has a recurrent stricture.  Recommendations #1 upper endoscopy with dilatation as indicated.  Coumadin will be held in anticipation of this exam

## 2011-06-08 NOTE — Patient Instructions (Signed)
Upper GI Endoscopy Upper GI endoscopy means using a flexible scope to look at the esophagus, stomach, and upper small bowel. This is done to make a diagnosis in people with heartburn, abdominal pain, or abnormal bleeding. Sometimes an endoscope is needed to remove foreign bodies or food that become stuck in the esophagus; it can also be used to take biopsy samples. For the best results, do not eat or drink for 8 hours before having your upper endoscopy.  To perform the endoscopy, you will probably be sedated and your throat will be numbed with a special spray. The endoscope is then slowly passed down your throat (this will not interfere with your breathing). An endoscopy exam takes 15 to 30 minutes to complete and there is no real pain. Patients rarely remember much about the procedure. The results of the test may take several days if a biopsy or other test is taken.  You may have a sore throat after an endoscopy exam. Serious complications are very rare. Stick to liquids and soft foods until your pain is better. Do not drive a car or operate any dangerous equipment for at least 24 hours after being sedated. SEEK IMMEDIATE MEDICAL CARE IF:   You have severe throat pain.   You have shortness of breath.   You have bleeding problems.   You have a fever.   You have difficulty recovering from your sedation.  Document Released: 04/09/2004 Document Revised: 02/19/2011 Document Reviewed: 03/04/2008 [facility] Patient Information 2012 [place].  DO NOT TAKE COUMADIN 5 DAYS BEFORE YOUR PROCEDURE PER DR Deatra Ina

## 2011-06-08 NOTE — Telephone Encounter (Signed)
Patient called, stating Dr.Kaplan will be doing a procedure 06/16/11 to check his difficulty swallowing,wants to make sure ok to hold coumadin 5 days prior to procedure.

## 2011-06-08 NOTE — Progress Notes (Signed)
History of Present Illness:  Mr. Ishee is a 78 year old white male with history of of an esophageal stricture, on Coumadin, referred at the request of Dr. Damita Dunnings for evaluation of dysphagia. He is having dysphagia to solids. He's had to cough up food at times. He has occasional process.  The patient takes Coumadin because of atrial fibrillation.    Past Medical History  Diagnosis Date  . Atrial fibrillation   . Hypertension   . Coronary artery disease   . Esophageal reflux   . Gout, unspecified   . Obesity, unspecified   . Paroxysmal nocturnal dyspnea   . Orthopnea   . Diverticulosis   . Vertigo   . Hyperlipidemia   . OSA (obstructive sleep apnea)     CPAP at night   Past Surgical History  Procedure Date  . Rotator cuff repair 2008    right  . [place] sleep study     per Dr. Brett Fairy  . Carotid dopplers 09/2009    no sig extracranial stenosis and vertebral arteries had antegrade flow  . Mri of brain 09/2009    chronic microvascular ischemia   family history includes Dementia in his father; Heart disease in his brother; Hypertension in his father and other; Pneumonia in his father; and Stomach cancer in his mother.  There is no history of Prostate cancer and Colon cancer. Current Outpatient Prescriptions  Medication Sig Dispense Refill  . allopurinol (ZYLOPRIM) 300 MG tablet Take 1 tablet (300 mg total) by mouth daily.  90 tablet  3  . diltiazem (CARDIZEM CD) 120 MG 24 hr capsule TAKE 1 CAPSULE EVERY DAY  30 capsule  5  . lisinopril (PRINIVIL,ZESTRIL) 20 MG tablet TAKE 1 TABLET EVERY DAY  30 tablet  9  . metoprolol tartrate (LOPRESSOR) 25 MG tablet Take 12.5 mg by mouth 2 (two) times daily.       [name] omeprazole (PRILOSEC OTC) 20 MG tablet Take 20 mg by mouth as needed.        . warfarin (COUMADIN) 4 MG tablet TAKE 1 TABLET BY MOUTH DAILY AS DIRECTED  40 tablet  3   Allergies as of 06/08/2011 - Review Complete 06/08/2011  Allergen Reaction Noted  . Doxycycline  05/19/2011  .  Penicillins  08/15/2008    reports that he quit smoking about 42 years ago. His smoking use included Cigarettes. He quit after 4 years of use. He has never used smokeless tobacco. He reports that he does not drink alcohol or use illicit drugs.     Review of Systems: Pertinent positive and negative review of systems were noted in the above HPI section. All other review of systems were otherwise negative.  Vital signs were reviewed in today's medical record Physical Exam: General: Well developed , well nourished, no acute distress Head: Normocephalic and atraumatic Eyes:  sclerae anicteric, EOMI Ears: Normal auditory acuity Mouth: No deformity or lesions Neck: Supple, no masses or thyromegaly Lungs: Clear throughout to auscultation Heart: Regular rate and rhythm; no murmurs, rubs or bruits Abdomen: Soft, non tender and non distended. No masses, hepatosplenomegaly or hernias noted. Normal Bowel sounds Rectal:deferred Musculoskeletal: Symmetrical with no gross deformities  Skin: No lesions on visible extremities Pulses:  Normal pulses noted Extremities: No clubbing, cyanosis, edema or deformities noted Neurological: Alert oriented x 4, grossly nonfocal Cervical Nodes:  No significant cervical adenopathy Inguinal Nodes: No significant inguinal adenopathy Psychological:  Alert and cooperative. Normal mood and affect

## 2011-06-09 NOTE — Telephone Encounter (Signed)
This is okay. The patient has no history of stroke or TIA. He can start Coumadin back after the procedure when it is okay with Dr. Deatra Ina.

## 2011-06-09 NOTE — Telephone Encounter (Signed)
Left message for pt to call back  °

## 2011-06-09 NOTE — Telephone Encounter (Signed)
I spoke with the pt and made him aware of Dr Antionette Char recommendation.

## 2011-06-15 HISTORY — PX: ESOPHAGOGASTRODUODENOSCOPY (EGD) WITH ESOPHAGEAL DILATION: SHX5812

## 2011-06-16 ENCOUNTER — Ambulatory Visit (AMBULATORY_SURGERY_CENTER): Payer: Medicare Other | Admitting: Gastroenterology

## 2011-06-16 ENCOUNTER — Encounter: Payer: Self-pay | Admitting: Gastroenterology

## 2011-06-16 VITALS — BP 132/74 | HR 56 | Temp 98.1°F | Resp 13 | Ht 66.0 in | Wt 197.0 lb

## 2011-06-16 DIAGNOSIS — K222 Esophageal obstruction: Secondary | ICD-10-CM | POA: Diagnosis not present

## 2011-06-16 DIAGNOSIS — R1310 Dysphagia, unspecified: Secondary | ICD-10-CM | POA: Diagnosis not present

## 2011-06-16 DIAGNOSIS — F411 Generalized anxiety disorder: Secondary | ICD-10-CM | POA: Diagnosis not present

## 2011-06-16 DIAGNOSIS — K2980 Duodenitis without bleeding: Secondary | ICD-10-CM

## 2011-06-16 DIAGNOSIS — A048 Other specified bacterial intestinal infections: Secondary | ICD-10-CM

## 2011-06-16 MED ORDER — SODIUM CHLORIDE 0.9 % IV SOLN: 500.0000 mL | INTRAVENOUS | Status: DC

## 2011-06-16 NOTE — Op Note (Signed)
[facility]. [place],   [id number]  ENDOSCOPY PROCEDURE REPORT  PATIENT:  [name], [name]  MR#:  [id number] BIRTHDATE:  1930/06/21, 74 yrs. old  GENDER:  male  ENDOSCOPIST:  Sandy Salaam. Deatra Ina, MD Referred by:  Elsie Stain, M.D.  PROCEDURE DATE:  06/16/2011 PROCEDURE:  EGD with biopsy, 43239, Maloney Dilation of Esophagus ASA CLASS:  Class II INDICATIONS:  dysphagia  MEDICATIONS:   MAC sedation, administered by CRNA propofol 100mg IV, glycopyrrolate (Robinal) 0.2 mg IV, 0.6cc simethancone 0.6 cc PO TOPICAL ANESTHETIC:  DESCRIPTION OF PROCEDURE:   After the risks and benefits of the procedure were explained, informed consent was obtained.  The LB [id number]-H180 [id number] endoscope was introduced through the mouth and advanced to the third portion of the duodenum.  The instrument was slowly withdrawn as the mucosa was fully examined. <<PROCEDUREIMAGES>>  Multiple ulcers were found in the bulb and descending duodenum. At least 2 59mm superficial ulcers in apex of bulb. Bxs taken (see image2 and image6).  A stricture was found at the gastroesophageal junction (see image11). Dilation with maloney dilator 29mm Mild resistance; no heme  irregular Z-line at the gastroesophageal junction. Slightly irregular z line (see image10). Bxs taken to r/o Barrett's  Otherwise the examination was normal (see image3 and image7).    Retroflexed views revealed no abnormalities. The scope was then withdrawn from the patient and the procedure completed.  COMPLICATIONS:  None  ENDOSCOPIC IMPRESSION: 1) Ulcers, multiple in the bulb/descending duodenum 2) Stricture at the gastroesophageal junction - s/p maloney dilitation 3) Irregular Z-line at the gastroesophageal junction 4) Otherwise normal examination RECOMMENDATIONS: 1) continue PPI 2) Await biopsy results 3) OP follow-up is advised on a PRN basis. 4) resume coumadin in am  ______________________________ Sandy Salaam.  Deatra Ina, MD  CC:  n. eSIGNED:   Sandy Salaam. Tatym Schermer at 06/16/2011 02:06 PM  [name], [id number]

## 2011-06-16 NOTE — Patient Instructions (Addendum)
Resume coumadin in am Discharge instructions given with verbal understanding. Handouts on a dilatation diet given. Resume previous medications.YOU HAD AN ENDOSCOPIC PROCEDURE TODAY AT [facility]: Refer to the procedure report that was given to you for any specific questions about what was found during the examination.  If the procedure report does not answer your questions, please call your gastroenterologist to clarify.  If you requested that your care partner not be given the details of your procedure findings, then the procedure report has been included in a sealed envelope for you to review at your convenience later.  YOU SHOULD EXPECT: Some feelings of bloating in the abdomen. Passage of more gas than usual.  Walking can help get rid of the air that was put into your GI tract during the procedure and reduce the bloating. If you had a lower endoscopy (such as a colonoscopy or flexible sigmoidoscopy) you may notice spotting of blood in your stool or on the toilet paper. If you underwent a bowel prep for your procedure, then you may not have a normal bowel movement for a few days.  DIET: Your first meal following the procedure should be a light meal and then it is ok to progress to your normal diet.  A half-sandwich or bowl of soup is an example of a good first meal.  Heavy or fried foods are harder to digest and may make you feel nauseous or bloated.  Likewise meals heavy in dairy and vegetables can cause extra gas to form and this can also increase the bloating.  Drink plenty of fluids but you should avoid alcoholic beverages for 24 hours.  ACTIVITY: Your care partner should take you home directly after the procedure.  You should plan to take it easy, moving slowly for the rest of the day.  You can resume normal activity the day after the procedure however you should NOT DRIVE or use heavy machinery for 24 hours (because of the sedation medicines used during the test).    SYMPTOMS TO  REPORT IMMEDIATELY: A gastroenterologist can be reached at any hour.  During normal business hours, 8:30 AM to 5:00 PM Monday through Friday, call [phone].  After hours and on weekends, please call the GI answering service at [phone] who will take a message and have the physician on call contact you.   Following upper endoscopy (EGD)  Vomiting of blood or coffee ground material  New chest pain or pain under the shoulder blades  Painful or persistently difficult swallowing  New shortness of breath  Fever of 100F or higher  Black, tarry-looking stools  FOLLOW UP: If any biopsies were taken you will be contacted by phone or by letter within the next 1-3 weeks.  Call your gastroenterologist if you have not heard about the biopsies in 3 weeks.  Our staff will call the home number listed on your records the next business day following your procedure to check on you and address any questions or concerns that you may have at that time regarding the information given to you following your procedure. This is a courtesy call and so if there is no answer at the home number and we have not heard from you through the emergency physician on call, we will assume that you have returned to your regular daily activities without incident.  SIGNATURES/CONFIDENTIALITY: You and/or your care partner have signed paperwork which will be entered into your electronic medical record.  These signatures attest to the fact that  that the information above on your After Visit Summary has been reviewed and is understood.  Full responsibility of the confidentiality of this discharge information lies with you and/or your care-partner.

## 2011-06-16 NOTE — Progress Notes (Signed)
Patient did not experience any of the following events: a burn prior to discharge; a fall within the facility; wrong site/side/patient/procedure/implant event; or a hospital transfer or hospital admission upon discharge from the facility. (G8907) Patient did not have preoperative order for IV antibiotic SSI prophylaxis. (G8918)  

## 2011-06-17 ENCOUNTER — Telehealth: Payer: Self-pay | Admitting: *Deleted

## 2011-06-17 NOTE — Telephone Encounter (Signed)
  Follow up Call-  Call back number 06/16/2011  Post procedure Call Back phone  # [phone]     Patient questions:  Do you have a fever, pain , or abdominal swelling? no Pain Score  0 *  Have you tolerated food without any problems? yes  Have you been able to return to your normal activities? yes  Do you have any questions about your discharge instructions: Diet   no Medications  no Follow up visit  no  Do you have questions or concerns about your Care? no  Actions: * If pain score is 4 or above: No action needed, pain <4.

## 2011-06-29 ENCOUNTER — Telehealth: Payer: Self-pay | Admitting: *Deleted

## 2011-06-29 ENCOUNTER — Encounter: Payer: Self-pay | Admitting: Gastroenterology

## 2011-06-29 ENCOUNTER — Encounter: Payer: Self-pay | Admitting: Family Medicine

## 2011-06-29 DIAGNOSIS — K269 Duodenal ulcer, unspecified as acute or chronic, without hemorrhage or perforation: Secondary | ICD-10-CM | POA: Insufficient documentation

## 2011-06-29 DIAGNOSIS — A048 Other specified bacterial intestinal infections: Secondary | ICD-10-CM

## 2011-06-29 MED ORDER — BIS SUBCIT-METRONID-TETRACYC 140-125-125 MG PO CAPS: ORAL_CAPSULE | ORAL | Status: DC

## 2011-06-29 NOTE — Telephone Encounter (Signed)
Dr. Deatra Ina prescribed Prev pak.  This pt is allergic to PCN, so RX changed to Pylera 3 capsules QID for 10 days.  Pt has an allergy to Doxycycline, which causes GI upset.  Per Dr. Deatra Ina, pt is to take rx as prescribed on a full stomach.  Pt's wife informed of RX and RX sent to pt's pharmacy

## 2011-07-06 ENCOUNTER — Ambulatory Visit (INDEPENDENT_AMBULATORY_CARE_PROVIDER_SITE_OTHER): Payer: Medicare Other

## 2011-07-06 DIAGNOSIS — I4891 Unspecified atrial fibrillation: Secondary | ICD-10-CM

## 2011-07-06 LAB — POCT INR: INR: 3.4

## 2011-07-14 ENCOUNTER — Other Ambulatory Visit: Payer: Self-pay | Admitting: Cardiovascular Disease

## 2011-07-20 ENCOUNTER — Ambulatory Visit (INDEPENDENT_AMBULATORY_CARE_PROVIDER_SITE_OTHER): Payer: Medicare Other | Admitting: *Deleted

## 2011-07-20 DIAGNOSIS — I4891 Unspecified atrial fibrillation: Secondary | ICD-10-CM | POA: Diagnosis not present

## 2011-08-09 ENCOUNTER — Other Ambulatory Visit: Payer: Self-pay | Admitting: Cardiovascular Disease

## 2011-08-11 ENCOUNTER — Ambulatory Visit (INDEPENDENT_AMBULATORY_CARE_PROVIDER_SITE_OTHER): Payer: Medicare Other | Admitting: Pharmacist

## 2011-08-11 DIAGNOSIS — I4891 Unspecified atrial fibrillation: Secondary | ICD-10-CM | POA: Diagnosis not present

## 2011-08-11 LAB — POCT INR: INR: 2.9

## 2011-08-25 ENCOUNTER — Other Ambulatory Visit: Payer: Self-pay | Admitting: Dermatology

## 2011-08-25 DIAGNOSIS — C44319 Basal cell carcinoma of skin of other parts of face: Secondary | ICD-10-CM | POA: Diagnosis not present

## 2011-09-08 ENCOUNTER — Other Ambulatory Visit: Payer: Self-pay | Admitting: Cardiovascular Disease

## 2011-09-08 ENCOUNTER — Ambulatory Visit (INDEPENDENT_AMBULATORY_CARE_PROVIDER_SITE_OTHER): Payer: Medicare Other | Admitting: *Deleted

## 2011-09-08 DIAGNOSIS — I4891 Unspecified atrial fibrillation: Secondary | ICD-10-CM | POA: Diagnosis not present

## 2011-09-08 LAB — POCT INR: INR: 3.5

## 2011-09-29 ENCOUNTER — Ambulatory Visit (INDEPENDENT_AMBULATORY_CARE_PROVIDER_SITE_OTHER): Payer: Medicare Other

## 2011-09-29 DIAGNOSIS — I4891 Unspecified atrial fibrillation: Secondary | ICD-10-CM | POA: Diagnosis not present

## 2011-10-01 ENCOUNTER — Other Ambulatory Visit: Payer: Self-pay | Admitting: Cardiovascular Disease

## 2011-10-06 DIAGNOSIS — Z85828 Personal history of other malignant neoplasm of skin: Secondary | ICD-10-CM | POA: Diagnosis not present

## 2011-10-15 ENCOUNTER — Telehealth: Payer: Self-pay | Admitting: Gastroenterology

## 2011-10-15 ENCOUNTER — Telehealth: Payer: Self-pay

## 2011-10-15 ENCOUNTER — Other Ambulatory Visit: Payer: Self-pay | Admitting: Gastroenterology

## 2011-10-15 MED ORDER — OMEPRAZOLE 20 MG PO CPDR: 20.0000 mg | DELAYED_RELEASE_CAPSULE | Freq: Every day | ORAL | Status: DC

## 2011-10-15 NOTE — Telephone Encounter (Signed)
Per procedure note pt just needs to follow-up as needed. Pt aware.

## 2011-10-15 NOTE — Telephone Encounter (Signed)
Pt has been taking the prilosec otc. Requesting rx be called in for him to take because medicare will cover it. Rx sent to the pharmacy for pt.

## 2011-10-20 ENCOUNTER — Ambulatory Visit (INDEPENDENT_AMBULATORY_CARE_PROVIDER_SITE_OTHER): Payer: Medicare Other | Admitting: *Deleted

## 2011-10-20 DIAGNOSIS — I4891 Unspecified atrial fibrillation: Secondary | ICD-10-CM

## 2011-10-20 LAB — POCT INR: INR: 3.2

## 2011-11-05 DIAGNOSIS — H902 Conductive hearing loss, unspecified: Secondary | ICD-10-CM | POA: Diagnosis not present

## 2011-11-05 DIAGNOSIS — H6120 Impacted cerumen, unspecified ear: Secondary | ICD-10-CM | POA: Diagnosis not present

## 2011-11-05 DIAGNOSIS — J310 Chronic rhinitis: Secondary | ICD-10-CM | POA: Diagnosis not present

## 2011-11-10 ENCOUNTER — Ambulatory Visit (INDEPENDENT_AMBULATORY_CARE_PROVIDER_SITE_OTHER): Payer: Medicare Other | Admitting: *Deleted

## 2011-11-10 DIAGNOSIS — I4891 Unspecified atrial fibrillation: Secondary | ICD-10-CM

## 2011-11-10 LAB — POCT INR: INR: 2.7

## 2011-12-08 ENCOUNTER — Ambulatory Visit (INDEPENDENT_AMBULATORY_CARE_PROVIDER_SITE_OTHER): Payer: Medicare Other | Admitting: *Deleted

## 2011-12-08 DIAGNOSIS — I4891 Unspecified atrial fibrillation: Secondary | ICD-10-CM

## 2012-01-05 ENCOUNTER — Ambulatory Visit (INDEPENDENT_AMBULATORY_CARE_PROVIDER_SITE_OTHER): Payer: Medicare Other | Admitting: *Deleted

## 2012-01-05 DIAGNOSIS — I4891 Unspecified atrial fibrillation: Secondary | ICD-10-CM

## 2012-01-05 DIAGNOSIS — Z85828 Personal history of other malignant neoplasm of skin: Secondary | ICD-10-CM | POA: Diagnosis not present

## 2012-01-28 ENCOUNTER — Other Ambulatory Visit: Payer: Self-pay | Admitting: Cardiovascular Disease

## 2012-02-05 ENCOUNTER — Other Ambulatory Visit: Payer: Self-pay | Admitting: Cardiovascular Disease

## 2012-02-16 ENCOUNTER — Ambulatory Visit (INDEPENDENT_AMBULATORY_CARE_PROVIDER_SITE_OTHER): Payer: Medicare Other | Admitting: Cardiovascular Disease

## 2012-02-16 ENCOUNTER — Ambulatory Visit (INDEPENDENT_AMBULATORY_CARE_PROVIDER_SITE_OTHER): Payer: Medicare Other

## 2012-02-16 ENCOUNTER — Encounter: Payer: Self-pay | Admitting: Cardiovascular Disease

## 2012-02-16 VITALS — BP 152/84 | HR 56 | Ht 66.0 in | Wt 206.0 lb

## 2012-02-16 DIAGNOSIS — I10 Essential (primary) hypertension: Secondary | ICD-10-CM | POA: Diagnosis not present

## 2012-02-16 DIAGNOSIS — I4891 Unspecified atrial fibrillation: Secondary | ICD-10-CM | POA: Diagnosis not present

## 2012-02-16 MED ORDER — LISINOPRIL 40 MG PO TABS: 40.0000 mg | ORAL_TABLET | Freq: Every day | ORAL | Status: DC

## 2012-02-16 NOTE — Patient Instructions (Addendum)
**Note De-Identified Javae Braaten Obfuscation** Your physician has recommended you make the following change in your medication: increase Lisinopril to 40 mg daily  Your physician wants you to follow-up in: 1 year. You will receive a reminder letter in the mail two months in advance. If you don't receive a letter, please call our office to schedule the follow-up appointment.

## 2012-02-16 NOTE — Progress Notes (Signed)
HPI:  78 year old gentleman presenting for followup evaluation. The patient has paroxysmal atrial fibrillation and he is maintained on chronic anticoagulation with warfarin. He's also followed for hypertension. He has undergone cardiac catheterization and he did not have significant CAD.  The patient is doing well without complaints at present. He specifically denies chest pain, chest pressure, dyspnea, orthopnea, or PND. He notes that he has gained a little more weight. He still works and he is physically active with his work but has not engaged in any regular exercise.  Outpatient Encounter Prescriptions as of 02/16/2012  Medication Sig Dispense Refill  . allopurinol (ZYLOPRIM) 300 MG tablet Take 1 tablet (300 mg total) by mouth daily.  90 tablet  3  . diltiazem (CARDIZEM CD) 120 MG 24 hr capsule TAKE 1 CAPSULE EVERY DAY  30 capsule  1  . lisinopril (PRINIVIL,ZESTRIL) 40 MG tablet Take 1 tablet (40 mg total) by mouth daily.  30 tablet  11  . metoprolol tartrate (LOPRESSOR) 25 MG tablet Take 12.5 mg by mouth 2 (two) times daily.       . metoprolol tartrate (LOPRESSOR) 25 MG tablet TAKE ONE TABLET BY MOUTH TWICE A DAY  60 tablet  8  . omeprazole (PRILOSEC OTC) 20 MG tablet Take 20 mg by mouth as needed.        . warfarin (COUMADIN) 4 MG tablet TAKE 1 TABLET BY MOUTH DAILY AS DIRECTED  45 tablet  3  . [DISCONTINUED] lisinopril (PRINIVIL,ZESTRIL) 20 MG tablet TAKE 1 TABLET EVERY DAY  30 tablet  9  . [DISCONTINUED] bismuth-metronidazole-tetracycline (PYLERA) 140-125-125 MG per capsule Take 3 capsules 4 times a day for 10 days on a full stomach  120 capsule  0  . [DISCONTINUED] omeprazole (PRILOSEC) 20 MG capsule Take 1 capsule (20 mg total) by mouth daily.  30 capsule  3  . [DISCONTINUED] warfarin (COUMADIN) 4 MG tablet TAKE 1 TABLET BY MOUTH DAILY AS DIRECTED  40 tablet  3    Allergies  Allergen Reactions  . Penicillins Shortness Of Breath and Swelling  . Doxycycline     GI upset    Past  Medical History  Diagnosis Date  . Atrial fibrillation   . Hypertension   . Coronary artery disease   . Esophageal reflux   . Gout, unspecified   . Obesity, unspecified   . Paroxysmal nocturnal dyspnea   . Orthopnea   . Diverticulosis   . Vertigo   . Hyperlipidemia   . OSA (obstructive sleep apnea)     CPAP at night    ROS: Negative except as per HPI  BP 152/84  Pulse 56  Ht 5\' 6"  (1.676 m)  Wt 93.441 kg (206 lb)  BMI 33.25 kg/m2  SpO2 96%  PHYSICAL EXAM: Pt is alert and oriented, obese male in NAD HEENT: normal Neck: JVP - normal, carotids 2+= without bruits Lungs: CTA bilaterally CV: RRR without murmur or gallop Abd: soft, NT, Positive BS, obese Ext: no C/C/E, distal pulses intact and equal Skin: warm/dry no rash  EKG:  Sinus bradycardia 56 beats per minute. Otherwise within normal limits.  ASSESSMENT AND PLAN: 1. Paroxysmal atrial fibrillation. The patient is maintaining sinus rhythm. He will continue on a low dose of metoprolol tartrate 12.5 mg twice a day along with diltiazem 120 mg daily. He is tolerating warfarin without bleeding complications. I will see him back in one year for followup.  2. Essential hypertension. Blood pressure control is suboptimal. I have recommended increasing lisinopril to  40 mg daily. We had a long discussion about the need for weight loss and dietary changes with sodium restriction.  3. Hyperlipidemia. Patient's total cholesterol is 206, triglycerides 252, HDL 33, and LDL 122. Therapeutic lifestyle changes reviewed with the patient.  Sherren Mocha 02/16/2012 10:00 AM

## 2012-03-15 ENCOUNTER — Other Ambulatory Visit: Payer: Self-pay | Admitting: Gastroenterology

## 2012-03-15 MED ORDER — OMEPRAZOLE MAGNESIUM 20 MG PO TBEC: 20.0000 mg | DELAYED_RELEASE_TABLET | Freq: Every day | ORAL | Status: DC | PRN

## 2012-03-15 NOTE — Telephone Encounter (Signed)
Refilled Omeprazole 20 mg 1 capsule by mouth every day as needed #30 with 6 refills to [facility] [place].

## 2012-03-29 ENCOUNTER — Ambulatory Visit (INDEPENDENT_AMBULATORY_CARE_PROVIDER_SITE_OTHER): Payer: Medicare Other | Admitting: *Deleted

## 2012-03-29 VITALS — BP 122/70

## 2012-03-29 DIAGNOSIS — I4891 Unspecified atrial fibrillation: Secondary | ICD-10-CM | POA: Diagnosis not present

## 2012-04-09 ENCOUNTER — Other Ambulatory Visit: Payer: Self-pay | Admitting: Cardiovascular Disease

## 2012-04-26 ENCOUNTER — Ambulatory Visit (INDEPENDENT_AMBULATORY_CARE_PROVIDER_SITE_OTHER): Payer: Medicare Other | Admitting: *Deleted

## 2012-04-26 DIAGNOSIS — I4891 Unspecified atrial fibrillation: Secondary | ICD-10-CM

## 2012-05-05 ENCOUNTER — Other Ambulatory Visit: Payer: Self-pay | Admitting: Family Medicine

## 2012-05-05 ENCOUNTER — Other Ambulatory Visit: Payer: Self-pay | Admitting: Cardiovascular Disease

## 2012-05-05 NOTE — Telephone Encounter (Signed)
Electronic refill request.  Patient has not been seen in the office since March of 2013.  Please advise.

## 2012-05-05 NOTE — Telephone Encounter (Signed)
Fax Received. Refill Completed. [name] (R.M.A)   

## 2012-05-06 NOTE — Telephone Encounter (Signed)
Patient advised.  Appts for CPE and labs scheduled.

## 2012-05-06 NOTE — Telephone Encounter (Signed)
Sent, please schedule a CPE.  Thanks.

## 2012-05-24 ENCOUNTER — Ambulatory Visit (INDEPENDENT_AMBULATORY_CARE_PROVIDER_SITE_OTHER): Payer: Medicare Other

## 2012-05-24 DIAGNOSIS — I4891 Unspecified atrial fibrillation: Secondary | ICD-10-CM | POA: Diagnosis not present

## 2012-05-24 LAB — POCT INR: INR: 2

## 2012-06-16 ENCOUNTER — Other Ambulatory Visit: Payer: Self-pay | Admitting: Family Medicine

## 2012-06-16 ENCOUNTER — Other Ambulatory Visit (INDEPENDENT_AMBULATORY_CARE_PROVIDER_SITE_OTHER): Payer: Medicare Other

## 2012-06-16 DIAGNOSIS — I10 Essential (primary) hypertension: Secondary | ICD-10-CM

## 2012-06-16 DIAGNOSIS — I4891 Unspecified atrial fibrillation: Secondary | ICD-10-CM

## 2012-06-16 DIAGNOSIS — M109 Gout, unspecified: Secondary | ICD-10-CM | POA: Diagnosis not present

## 2012-06-16 LAB — CBC WITH DIFFERENTIAL/PLATELET
Basophils Relative: 0.5 % (ref 0.0–3.0)
Eosinophils Absolute: 0.2 10*3/uL (ref 0.0–0.7)
Hemoglobin: 15.6 g/dL (ref 13.0–17.0)
Lymphs Abs: 1.5 10*3/uL (ref 0.7–4.0)
MCHC: 34 g/dL (ref 30.0–36.0)
MCV: 87.1 fl (ref 78.0–100.0)
Monocytes Absolute: 0.5 10*3/uL (ref 0.1–1.0)
Neutro Abs: 2.9 10*3/uL (ref 1.4–7.7)
RBC: 5.27 Mil/uL (ref 4.22–5.81)

## 2012-06-16 LAB — COMPREHENSIVE METABOLIC PANEL
AST: 25 U/L (ref 0–37)
Alkaline Phosphatase: 78 U/L (ref 39–117)
BUN: 22 mg/dL (ref 6–23)
Calcium: 8.9 mg/dL (ref 8.4–10.5)
Creatinine, Ser: 1.4 mg/dL (ref 0.4–1.5)

## 2012-06-16 LAB — LIPID PANEL
Cholesterol: 175 mg/dL (ref 0–200)
Triglycerides: 266 mg/dL — ABNORMAL HIGH (ref 0.0–149.0)
VLDL: 53.2 mg/dL — ABNORMAL HIGH (ref 0.0–40.0)

## 2012-06-16 LAB — URIC ACID: Uric Acid, Serum: 8.7 mg/dL — ABNORMAL HIGH (ref 4.0–7.8)

## 2012-06-16 LAB — LDL CHOLESTEROL, DIRECT: Direct LDL: 103.9 mg/dL

## 2012-06-21 ENCOUNTER — Ambulatory Visit (INDEPENDENT_AMBULATORY_CARE_PROVIDER_SITE_OTHER): Payer: Medicare Other | Admitting: *Deleted

## 2012-06-21 DIAGNOSIS — I4891 Unspecified atrial fibrillation: Secondary | ICD-10-CM

## 2012-06-23 ENCOUNTER — Ambulatory Visit (INDEPENDENT_AMBULATORY_CARE_PROVIDER_SITE_OTHER): Payer: Medicare Other | Admitting: Family Medicine

## 2012-06-23 ENCOUNTER — Encounter: Payer: Self-pay | Admitting: Family Medicine

## 2012-06-23 VITALS — BP 116/64 | HR 71 | Temp 97.9°F | Ht 64.0 in | Wt 207.0 lb

## 2012-06-23 DIAGNOSIS — M109 Gout, unspecified: Secondary | ICD-10-CM

## 2012-06-23 DIAGNOSIS — Z0000 Encounter for general adult medical examination without abnormal findings: Secondary | ICD-10-CM | POA: Diagnosis not present

## 2012-06-23 DIAGNOSIS — K269 Duodenal ulcer, unspecified as acute or chronic, without hemorrhage or perforation: Secondary | ICD-10-CM

## 2012-06-23 DIAGNOSIS — G4733 Obstructive sleep apnea (adult) (pediatric): Secondary | ICD-10-CM

## 2012-06-23 DIAGNOSIS — E7800 Pure hypercholesterolemia, unspecified: Secondary | ICD-10-CM | POA: Diagnosis not present

## 2012-06-23 DIAGNOSIS — I10 Essential (primary) hypertension: Secondary | ICD-10-CM

## 2012-06-23 DIAGNOSIS — Z9989 Dependence on other enabling machines and devices: Secondary | ICD-10-CM

## 2012-06-23 DIAGNOSIS — I4891 Unspecified atrial fibrillation: Secondary | ICD-10-CM

## 2012-06-23 DIAGNOSIS — E669 Obesity, unspecified: Secondary | ICD-10-CM

## 2012-06-23 NOTE — Patient Instructions (Addendum)
Check your sugar a few times. It should be under 100 if you haven't eaten. It if is consistently higher, then let me know. Stay off sweets/sugary foods.   Don't change your meds.  Take care.  Glad to see you.

## 2012-06-24 ENCOUNTER — Encounter: Payer: Self-pay | Admitting: Family Medicine

## 2012-06-24 DIAGNOSIS — Z0000 Encounter for general adult medical examination without abnormal findings: Secondary | ICD-10-CM | POA: Insufficient documentation

## 2012-06-24 NOTE — Assessment & Plan Note (Signed)
Feels well, continue as is with CPAP.

## 2012-06-24 NOTE — Assessment & Plan Note (Signed)
Sounds to be rrr today, not tachy, no CP, no BLE edema.  Continue as is.

## 2012-06-24 NOTE — Assessment & Plan Note (Signed)
Controlled, continue as is.  

## 2012-06-24 NOTE — Assessment & Plan Note (Signed)
D/w pt about weight loss, esp with h/o gout and abnormal glucose.

## 2012-06-24 NOTE — Assessment & Plan Note (Signed)
Needs weight loss for TGs.  Discussed.

## 2012-06-24 NOTE — Assessment & Plan Note (Signed)
See scanned forms.  Routine anticipatory guidance given to patient.  See health maintenance. Flu 2013 Shingles 2011 PNA 2011 Tetanus 2011 Colonoscopy prev done Prostate cancer screening not indicated.  Prostate cancer screening and PSA options (with potential risks and benefits of testing vs not testing) were discussed along with recent recs/guidelines.  He declined testing PSA at this point.  Advance directive d/w pt.  Would have wife and both daughters designated equally if he were incapacitated.  Cognitive function addressed- see scanned forms- and if abnormal then additional documentation follows.

## 2012-06-24 NOTE — Assessment & Plan Note (Signed)
Continue current meds 

## 2012-06-24 NOTE — Assessment & Plan Note (Signed)
No change in meds.

## 2012-06-24 NOTE — Progress Notes (Signed)
I have personally reviewed the Medicare Annual Wellness questionnaire and have noted 1. The patient's medical and social history 2. Their use of alcohol, tobacco or illicit drugs 3. Their current medications and supplements 4. The patient's functional ability including ADL's, fall risks, home safety risks and hearing or visual             impairment. 5. Diet and physical activities 6. Evidence for depression or mood disorders  The patients weight, height, BMI have been recorded in the chart and visual acuity is per eye clinic.  I have made referrals, counseling and provided education to the patient based review of the above and I have provided the pt with a written personalized care plan for preventive services.  See scanned forms.  Routine anticipatory guidance given to patient.  See health maintenance. Flu 2013 Shingles 2011 PNA 2011 Tetanus 2011 Colonoscopy prev done Prostate cancer screening not indicated.  Prostate cancer screening and PSA options (with potential risks and benefits of testing vs not testing) were discussed along with recent recs/guidelines.  He declined testing PSA at this point.  Advance directive d/w pt.  Would have wife and both daughters designated equally if he were incapacitated.  Cognitive function addressed- see scanned forms- and if abnormal then additional documentation follows.   OSA on CPAP and doing well.  Sleeping well. No fatigue.    Gout.  Rare flare, doing well with current meds. No pain or joint erythema now.   AF.  No bleeding, palpitations, CP. Compliant with meds and feels well.   H/o GERD and gastric ulcers and doing well on PPI, no abd pain, no vomiting,no dysphagia.  EGD done 2013.   PMH and SH reviewed  Meds, vitals, and allergies reviewed.   ROS: See HPI.  Otherwise negative.    GEN: nad, alert and oriented, overweight.  HEENT: mucous membranes moist NECK: supple w/o LA CV: rrr. PULM: ctab, no inc wob ABD: soft, +bs EXT: no  edema SKIN: no acute rash

## 2012-06-29 ENCOUNTER — Telehealth: Payer: Self-pay

## 2012-06-29 NOTE — Telephone Encounter (Signed)
Pt request written order for c pap supplies;mask, mouth piece,head gear,tubing, 2 cylinders that go in cpap machine sent to [facility] in [place].Please advise.(pt does not know [facility]'s phone or fax #.

## 2012-06-29 NOTE — Telephone Encounter (Signed)
Please see about getting a form for me to fill out.  Thanks.

## 2012-06-30 NOTE — Telephone Encounter (Signed)
Left message for patient to call back b/c he will need to contact company and have forms faxed to [place].

## 2012-07-02 ENCOUNTER — Other Ambulatory Visit: Payer: Self-pay | Admitting: Cardiovascular Disease

## 2012-07-04 ENCOUNTER — Encounter: Payer: Self-pay | Admitting: *Deleted

## 2012-07-04 ENCOUNTER — Other Ambulatory Visit: Payer: Self-pay | Admitting: Cardiovascular Disease

## 2012-07-04 ENCOUNTER — Telehealth: Payer: Self-pay | Admitting: *Deleted

## 2012-07-04 NOTE — Telephone Encounter (Signed)
See next phone note.  [name] (daughter) left a message on my VM in my absence from work.

## 2012-07-04 NOTE — Telephone Encounter (Signed)
Patient's daughter [name]) requests an order for C-Pap supplies to be faxed to [facility] Fax:  [phone].  She says they need only an order saying C-Pap Supplies as needed.  Order faxed.  See letter.

## 2012-07-04 NOTE — Telephone Encounter (Signed)
Thanks

## 2012-07-05 ENCOUNTER — Other Ambulatory Visit: Payer: Self-pay | Admitting: *Deleted

## 2012-07-05 DIAGNOSIS — Z85828 Personal history of other malignant neoplasm of skin: Secondary | ICD-10-CM | POA: Diagnosis not present

## 2012-07-05 DIAGNOSIS — L570 Actinic keratosis: Secondary | ICD-10-CM | POA: Diagnosis not present

## 2012-08-02 ENCOUNTER — Ambulatory Visit (INDEPENDENT_AMBULATORY_CARE_PROVIDER_SITE_OTHER): Payer: Medicare Other | Admitting: *Deleted

## 2012-08-02 DIAGNOSIS — I4891 Unspecified atrial fibrillation: Secondary | ICD-10-CM

## 2012-08-02 LAB — POCT INR: INR: 3.4

## 2012-08-30 ENCOUNTER — Ambulatory Visit (INDEPENDENT_AMBULATORY_CARE_PROVIDER_SITE_OTHER): Payer: Medicare Other | Admitting: *Deleted

## 2012-08-30 DIAGNOSIS — I4891 Unspecified atrial fibrillation: Secondary | ICD-10-CM

## 2012-08-30 DIAGNOSIS — M542 Cervicalgia: Secondary | ICD-10-CM | POA: Diagnosis not present

## 2012-08-30 DIAGNOSIS — M25519 Pain in unspecified shoulder: Secondary | ICD-10-CM | POA: Diagnosis not present

## 2012-08-30 LAB — POCT INR: INR: 3

## 2012-09-25 ENCOUNTER — Other Ambulatory Visit: Payer: Self-pay | Admitting: Cardiovascular Disease

## 2012-09-26 ENCOUNTER — Other Ambulatory Visit: Payer: Self-pay | Admitting: Cardiovascular Disease

## 2012-09-27 ENCOUNTER — Ambulatory Visit (INDEPENDENT_AMBULATORY_CARE_PROVIDER_SITE_OTHER): Payer: Medicare Other | Admitting: *Deleted

## 2012-09-27 DIAGNOSIS — I4891 Unspecified atrial fibrillation: Secondary | ICD-10-CM

## 2012-09-27 LAB — POCT INR: INR: 4.1

## 2012-10-12 ENCOUNTER — Ambulatory Visit (INDEPENDENT_AMBULATORY_CARE_PROVIDER_SITE_OTHER): Payer: Medicare Other | Admitting: *Deleted

## 2012-10-12 DIAGNOSIS — I4891 Unspecified atrial fibrillation: Secondary | ICD-10-CM

## 2012-11-02 ENCOUNTER — Ambulatory Visit (INDEPENDENT_AMBULATORY_CARE_PROVIDER_SITE_OTHER): Payer: Medicare Other | Admitting: *Deleted

## 2012-11-02 DIAGNOSIS — I4891 Unspecified atrial fibrillation: Secondary | ICD-10-CM | POA: Diagnosis not present

## 2012-12-07 ENCOUNTER — Ambulatory Visit (INDEPENDENT_AMBULATORY_CARE_PROVIDER_SITE_OTHER): Payer: Medicare Other | Admitting: *Deleted

## 2012-12-07 DIAGNOSIS — I4891 Unspecified atrial fibrillation: Secondary | ICD-10-CM

## 2012-12-23 DIAGNOSIS — Z23 Encounter for immunization: Secondary | ICD-10-CM | POA: Diagnosis not present

## 2013-01-18 ENCOUNTER — Ambulatory Visit (INDEPENDENT_AMBULATORY_CARE_PROVIDER_SITE_OTHER): Payer: Medicare Other | Admitting: *Deleted

## 2013-01-18 DIAGNOSIS — I4891 Unspecified atrial fibrillation: Secondary | ICD-10-CM

## 2013-01-31 ENCOUNTER — Encounter (INDEPENDENT_AMBULATORY_CARE_PROVIDER_SITE_OTHER): Payer: Self-pay

## 2013-01-31 ENCOUNTER — Encounter: Payer: Self-pay | Admitting: Cardiovascular Disease

## 2013-01-31 ENCOUNTER — Ambulatory Visit (INDEPENDENT_AMBULATORY_CARE_PROVIDER_SITE_OTHER): Payer: Medicare Other | Admitting: Cardiovascular Disease

## 2013-01-31 VITALS — BP 130/70 | HR 68 | Ht 64.0 in | Wt 203.0 lb

## 2013-01-31 DIAGNOSIS — I2510 Atherosclerotic heart disease of native coronary artery without angina pectoris: Secondary | ICD-10-CM | POA: Diagnosis not present

## 2013-01-31 NOTE — Progress Notes (Signed)
    HPI:  79 year old gentleman presenting for followup evaluation. The patient is followed for paroxysmal atrial fibrillation. He is maintained on chronic anticoagulation with warfarin.  He is doing well. He denies chest pain, shortness of breath, or leg swelling. He's had no palpitations. In the past when he has been in atrial fibrillation he was asymptomatic. The patient continues to do physical work. 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  Outpatient Encounter Prescriptions as of 01/31/2013  Medication Sig  . allopurinol (ZYLOPRIM) 300 MG tablet Take 1 tablet (300 mg total) by mouth daily.  [name] COLCRYS 0.6 MG tablet TAKE 1 TABLET BY MOUTH TWICE A DAY AS NEEDED  . diltiazem (CARDIZEM CD) 120 MG 24 hr capsule Take 1 capsule (120 mg total) by mouth daily.  [name] lisinopril (PRINIVIL,ZESTRIL) 40 MG tablet Take 1 tablet (40 mg total) by mouth daily.  . metoprolol tartrate (LOPRESSOR) 25 MG tablet Take 12.5 mg by mouth 2 (two) times daily.   [name] omeprazole (PRILOSEC OTC) 20 MG tablet Take 1 tablet (20 mg total) by mouth daily as needed.  . warfarin (COUMADIN) 4 MG tablet TAKE 1 TABLET BY MOUTH DAILY AS DIRECTED    Allergies  Allergen Reactions  . Penicillins Shortness Of Breath and Swelling  . Doxycycline     GI upset    Past Medical History  Diagnosis Date  . Atrial fibrillation   . Hypertension   . Coronary artery disease   . Esophageal reflux   . Gout, unspecified   . Obesity, unspecified   . Paroxysmal nocturnal dyspnea   . Orthopnea   . Diverticulosis   . Vertigo   . Hyperlipidemia   . OSA (obstructive sleep apnea)     CPAP at night  . Gastric ulcer     on EGD 2013    ROS: Negative except as per HPI  BP 130/70  Pulse 68  Ht 5\' 4"  (1.626 m)  Wt 203 lb (92.08 kg)  BMI 34.83 kg/m2  PHYSICAL EXAM: Pt is alert and oriented, pleasant obese male in NAD HEENT: normal Neck: JVP - normal, carotids 2+= without bruits Lungs: CTA bilaterally CV: RRR without murmur or  gallop Abd: soft, NT, Positive BS, obese Ext: no C/C/E, distal pulses intact and equal Skin: warm/dry no rash  EKG:  Normal sinus rhythm 68 beats per minute, within normal limits.  ASSESSMENT AND PLAN: 1. Paroxysmal atrial fibrillation. He is tolerating long-term anticoagulation with warfarin and remains in sinus rhythm.  2. Hypertension. Blood pressure is well controlled. He brings in home readings which I have reviewed. Average blood pressures are in the 130s over 70s. Heart rates are in the 60s.  For followup I will see him back in one year unless problems arise.  Sherren Mocha 02/02/2013 10:30 PM

## 2013-01-31 NOTE — Patient Instructions (Signed)
Your physician wants you to follow-up in: 1 YEAR with Dr Cooper.  You will receive a reminder letter in the mail two months in advance. If you don't receive a letter, please call our office to schedule the follow-up appointment.  Your physician recommends that you continue on your current medications as directed. Please refer to the Current Medication list given to you today.  

## 2013-02-15 ENCOUNTER — Ambulatory Visit: Payer: Medicare Other | Admitting: Cardiovascular Disease

## 2013-02-19 ENCOUNTER — Other Ambulatory Visit: Payer: Self-pay | Admitting: Cardiovascular Disease

## 2013-03-01 ENCOUNTER — Ambulatory Visit (INDEPENDENT_AMBULATORY_CARE_PROVIDER_SITE_OTHER): Payer: Medicare Other | Admitting: *Deleted

## 2013-03-01 DIAGNOSIS — I4891 Unspecified atrial fibrillation: Secondary | ICD-10-CM | POA: Diagnosis not present

## 2013-03-01 LAB — POCT INR: INR: 2.4

## 2013-03-04 ENCOUNTER — Other Ambulatory Visit: Payer: Self-pay | Admitting: Cardiovascular Disease

## 2013-03-11 ENCOUNTER — Other Ambulatory Visit: Payer: Self-pay | Admitting: Cardiovascular Disease

## 2013-03-20 DIAGNOSIS — M25559 Pain in unspecified hip: Secondary | ICD-10-CM | POA: Diagnosis not present

## 2013-03-20 DIAGNOSIS — M545 Low back pain, unspecified: Secondary | ICD-10-CM | POA: Diagnosis not present

## 2013-03-29 ENCOUNTER — Ambulatory Visit (INDEPENDENT_AMBULATORY_CARE_PROVIDER_SITE_OTHER): Payer: Medicare Other | Admitting: Cardiovascular Disease

## 2013-03-29 ENCOUNTER — Encounter: Payer: Self-pay | Admitting: Cardiovascular Disease

## 2013-03-29 ENCOUNTER — Telehealth: Payer: Self-pay | Admitting: Cardiovascular Disease

## 2013-03-29 VITALS — BP 124/84 | HR 74 | Ht 64.0 in | Wt 209.0 lb

## 2013-03-29 DIAGNOSIS — I2510 Atherosclerotic heart disease of native coronary artery without angina pectoris: Secondary | ICD-10-CM

## 2013-03-29 DIAGNOSIS — I4891 Unspecified atrial fibrillation: Secondary | ICD-10-CM

## 2013-03-29 NOTE — Telephone Encounter (Signed)
Spoke with patient who c/o heart out of rhythm since yesterday morning and since that time he has been feeling like he is staggering and funny in the head; patient denies dizziness, SOB.  Patient c/o chest pressure intermittently since yesterday morning.   Patient reports current vital signs: 165/86, HR 73.  I advised patient that Dr. Burt Knack is willing to see him at the end of the day today or tomorrow we have an opening at 11:45.  Patient elects to come today and states one of his daughters will bring him.

## 2013-03-29 NOTE — Patient Instructions (Signed)
Your physician wants you to follow-up in: 6 months with Dr. Burt Knack.  You will receive a reminder letter in the mail two months in advance. If you don't receive a letter, please call our office to schedule the follow-up appointment.  Your physician recommends that you call the office [phone]) if you have heart palpitations so that we can set you up for a cardiac event monitor.   Your physician has recommended you make the following change in your medication:  Take an extra Metoprolol (Lopressor) 25 mg if you feel yourself go into Atrial fib

## 2013-03-29 NOTE — Telephone Encounter (Signed)
New Problem:  Pt's daughter, Cassandria Santee,  is calling c/o her father being dizzy, in a-fib, irregular heart beat.... Cassandria Santee would like her father to be seen today. She is also requesting a call back from the nurse. She did not want to set her father up for the next available Burt Knack or PA appt.

## 2013-03-30 ENCOUNTER — Encounter: Payer: Self-pay | Admitting: Cardiovascular Disease

## 2013-03-30 NOTE — Progress Notes (Signed)
HPI:  80 year old gentleman presenting for followup evaluation. The patient is followed for paroxysmal atrial fibrillation. He is maintained on chronic anticoagulation with warfarin.  He called in today with complaints of dizziness and palpitations. He checked his heart rate and it was elevated and irregular. The patient's symptoms occurred for approximately one to 2 days. Just before he came into the office, he felt his heart become more regular. His dizziness has suddenly resolved. He had no chest pain or shortness of breath associated with this. He was concerned about the presence of atrial fibrillation and comes in today for further evaluation.  The patient has otherwise been in his normal state of health. He's had no recent illness. He remains compliant with his medications. He is also compliant with treatment for his sleep apnea.  Outpatient Encounter Prescriptions as of 03/29/2013  Medication Sig  . allopurinol (ZYLOPRIM) 300 MG tablet Take 1 tablet (300 mg total) by mouth daily.  [name] COLCRYS 0.6 MG tablet TAKE 1 TABLET BY MOUTH TWICE A DAY AS NEEDED  . diltiazem (CARDIZEM CD) 120 MG 24 hr capsule TAKE ONE CAPSULE BY MOUTH DAILY  . lisinopril (PRINIVIL,ZESTRIL) 40 MG tablet TAKE 1 TABLET EVERY DAY  . metoprolol tartrate (LOPRESSOR) 25 MG tablet Take 12.5 mg by mouth 2 (two) times daily.   [name] omeprazole (PRILOSEC OTC) 20 MG tablet Take 1 tablet (20 mg total) by mouth daily as needed.  . warfarin (COUMADIN) 4 MG tablet TAKE 1 TABLET BY MOUTH DAILY AS DIRECTED  . [DISCONTINUED] diltiazem (CARDIZEM CD) 120 MG 24 hr capsule Take 1 capsule (120 mg total) by mouth daily.    Allergies  Allergen Reactions  . Penicillins Shortness Of Breath and Swelling  . Doxycycline     GI upset    Past Medical History  Diagnosis Date  . Atrial fibrillation   . Hypertension   . Coronary artery disease   . Esophageal reflux   . Gout, unspecified   . Obesity, unspecified   . Paroxysmal nocturnal  dyspnea   . Orthopnea   . Diverticulosis   . Vertigo   . Hyperlipidemia   . OSA (obstructive sleep apnea)     CPAP at night  . Gastric ulcer     on EGD 2013    ROS: Negative except as per HPI  BP 124/84  Pulse 74  Ht 5\' 4"  (1.626 m)  Wt 209 lb (94.802 kg)  BMI 35.86 kg/m2  PHYSICAL EXAM: Pt is alert and oriented, NAD HEENT: normal Neck: JVP - normal, carotids 2+= without bruits Lungs: CTA bilaterally CV: RRR without murmur or gallop Abd: soft, NT, Positive BS, obese Ext: no C/C/E, distal pulses intact and equal Skin: warm/dry no rash  EKG:  Sinus rhythm 74 beats per minute, within normal limits.  ASSESSMENT AND PLAN: 1. Paroxysmal atrial fibrillation. Suspect his symptoms were related to atrial fib. Also possible that he had another rhythm such as symptomatic PVCs. One consideration would be an event monitor if symptoms recur. If he is having symptomatic atrial fibrillation, flecainide would be a reasonable antiarrhythmic choice. The patient does not have coronary artery disease. We discussed consideration of an antiarrhythmic, and he would like to wait for now. Will see if symptoms recur. He maintains anticoagulated with warfarin and understands that this is the key part of treatment as it relates to stroke prevention.  2. Hypertension. Blood pressure controlled on diltiazem and lisinopril as well as low-dose metoprolol.  Disposition: Reassurance. Event monitor if symptoms  recur. Consider antiarrhythmic drug therapy as above. He should take an extra metoprolol if symptomatic palpitations.  Sherren Mocha 03/30/2013 8:44 AM

## 2013-04-12 ENCOUNTER — Ambulatory Visit (INDEPENDENT_AMBULATORY_CARE_PROVIDER_SITE_OTHER): Payer: Medicare Other | Admitting: *Deleted

## 2013-04-12 DIAGNOSIS — Z5181 Encounter for therapeutic drug level monitoring: Secondary | ICD-10-CM

## 2013-04-12 DIAGNOSIS — I4891 Unspecified atrial fibrillation: Secondary | ICD-10-CM | POA: Diagnosis not present

## 2013-04-12 LAB — POCT INR: INR: 2.2

## 2013-05-24 ENCOUNTER — Ambulatory Visit (INDEPENDENT_AMBULATORY_CARE_PROVIDER_SITE_OTHER): Payer: Medicare Other | Admitting: *Deleted

## 2013-05-24 DIAGNOSIS — I4891 Unspecified atrial fibrillation: Secondary | ICD-10-CM | POA: Diagnosis not present

## 2013-05-24 DIAGNOSIS — Z5181 Encounter for therapeutic drug level monitoring: Secondary | ICD-10-CM | POA: Diagnosis not present

## 2013-05-24 LAB — POCT INR: INR: 2.5

## 2013-06-20 ENCOUNTER — Other Ambulatory Visit: Payer: Self-pay | Admitting: Family Medicine

## 2013-06-30 DIAGNOSIS — L259 Unspecified contact dermatitis, unspecified cause: Secondary | ICD-10-CM | POA: Diagnosis not present

## 2013-06-30 DIAGNOSIS — Z85828 Personal history of other malignant neoplasm of skin: Secondary | ICD-10-CM | POA: Diagnosis not present

## 2013-06-30 DIAGNOSIS — L820 Inflamed seborrheic keratosis: Secondary | ICD-10-CM | POA: Diagnosis not present

## 2013-07-06 ENCOUNTER — Other Ambulatory Visit: Payer: Self-pay | Admitting: Cardiovascular Disease

## 2013-07-17 ENCOUNTER — Ambulatory Visit (INDEPENDENT_AMBULATORY_CARE_PROVIDER_SITE_OTHER): Payer: Medicare Other | Admitting: *Deleted

## 2013-07-17 DIAGNOSIS — Z5181 Encounter for therapeutic drug level monitoring: Secondary | ICD-10-CM | POA: Diagnosis not present

## 2013-07-17 DIAGNOSIS — I4891 Unspecified atrial fibrillation: Secondary | ICD-10-CM | POA: Diagnosis not present

## 2013-07-17 LAB — POCT INR: INR: 2.7

## 2013-07-26 ENCOUNTER — Telehealth: Payer: Self-pay | Admitting: Family Medicine

## 2013-07-26 NOTE — Telephone Encounter (Signed)
Spoke to [place] (patient's daughter) and was advised that she does not know what he needs. Cassandria Santee stated that she will contact the company that supplies the equipment and will call back with what is needed. Cassandria Santee stated that she is going to request that the company fax over to Dr. Damita Dunnings a list of exactly what patient needs.

## 2013-07-26 NOTE — Telephone Encounter (Signed)
Pt's daughter filled out triage form requesting refill on CPAP supplies.

## 2013-07-26 NOTE — Telephone Encounter (Signed)
Left message with patient's husband to have daughter call back.

## 2013-07-26 NOTE — Telephone Encounter (Signed)
See what supplies are specifically needed, if not already done, and then put it on my desk.  Thanks.

## 2013-08-01 ENCOUNTER — Encounter: Payer: Self-pay | Admitting: *Deleted

## 2013-08-01 NOTE — Telephone Encounter (Signed)
Received fax from company but it does not signify which supplies are requested.  Phoned patient's daughter and she states they need: 1) Full Face Mask 2) Tubing 3) Filters 4) Head Gear 5) Water Chamber Order faxed to [facility]   [phone]

## 2013-08-02 ENCOUNTER — Other Ambulatory Visit: Payer: Self-pay | Admitting: Cardiovascular Disease

## 2013-08-22 ENCOUNTER — Other Ambulatory Visit: Payer: Self-pay | Admitting: Family Medicine

## 2013-08-22 ENCOUNTER — Ambulatory Visit (INDEPENDENT_AMBULATORY_CARE_PROVIDER_SITE_OTHER): Payer: Medicare Other | Admitting: Family Medicine

## 2013-08-22 ENCOUNTER — Encounter: Payer: Self-pay | Admitting: Family Medicine

## 2013-08-22 VITALS — BP 130/72 | HR 67 | Temp 98.3°F | Ht 66.0 in | Wt 206.5 lb

## 2013-08-22 DIAGNOSIS — I4891 Unspecified atrial fibrillation: Secondary | ICD-10-CM | POA: Diagnosis not present

## 2013-08-22 DIAGNOSIS — I10 Essential (primary) hypertension: Secondary | ICD-10-CM

## 2013-08-22 DIAGNOSIS — M109 Gout, unspecified: Secondary | ICD-10-CM

## 2013-08-22 DIAGNOSIS — Z0000 Encounter for general adult medical examination without abnormal findings: Secondary | ICD-10-CM | POA: Diagnosis not present

## 2013-08-22 DIAGNOSIS — I2510 Atherosclerotic heart disease of native coronary artery without angina pectoris: Secondary | ICD-10-CM

## 2013-08-22 DIAGNOSIS — G4733 Obstructive sleep apnea (adult) (pediatric): Secondary | ICD-10-CM

## 2013-08-22 DIAGNOSIS — Z9989 Dependence on other enabling machines and devices: Secondary | ICD-10-CM

## 2013-08-22 LAB — CBC WITH DIFFERENTIAL/PLATELET
BASOS PCT: 0.5 % (ref 0.0–3.0)
Basophils Absolute: 0 10*3/uL (ref 0.0–0.1)
EOS ABS: 0.2 10*3/uL (ref 0.0–0.7)
Eosinophils Relative: 2.4 % (ref 0.0–5.0)
HCT: 46.1 % (ref 39.0–52.0)
Hemoglobin: 15.7 g/dL (ref 13.0–17.0)
LYMPHS PCT: 30.5 % (ref 12.0–46.0)
Lymphs Abs: 2 10*3/uL (ref 0.7–4.0)
MCHC: 34 g/dL (ref 30.0–36.0)
MCV: 88.8 fl (ref 78.0–100.0)
MONO ABS: 0.6 10*3/uL (ref 0.1–1.0)
Monocytes Relative: 9.5 % (ref 3.0–12.0)
NEUTROS PCT: 57.1 % (ref 43.0–77.0)
Neutro Abs: 3.7 10*3/uL (ref 1.4–7.7)
PLATELETS: 182 10*3/uL (ref 150.0–400.0)
RBC: 5.19 Mil/uL (ref 4.22–5.81)
RDW: 13.6 % (ref 11.5–15.5)
WBC: 6.5 10*3/uL (ref 4.0–10.5)

## 2013-08-22 LAB — LIPID PANEL
CHOL/HDL RATIO: 6
Cholesterol: 180 mg/dL (ref 0–200)
HDL: 27.7 mg/dL — AB (ref >39.00)
LDL Cholesterol: 94 mg/dL (ref 0–99)
NONHDL: 152.3
Triglycerides: 290 mg/dL — ABNORMAL HIGH (ref 0.0–149.0)
VLDL: 58 mg/dL — ABNORMAL HIGH (ref 0.0–40.0)

## 2013-08-22 LAB — COMPREHENSIVE METABOLIC PANEL
ALT: 21 U/L (ref 0–53)
AST: 25 U/L (ref 0–37)
Albumin: 4.1 g/dL (ref 3.5–5.2)
Alkaline Phosphatase: 72 U/L (ref 39–117)
BUN: 16 mg/dL (ref 6–23)
CO2: 27 mEq/L (ref 19–32)
Calcium: 9.2 mg/dL (ref 8.4–10.5)
Chloride: 103 mEq/L (ref 96–112)
Creatinine, Ser: 1.3 mg/dL (ref 0.4–1.5)
GFR: 56.87 mL/min — AB (ref >60.00)
GLUCOSE: 91 mg/dL (ref 70–99)
Potassium: 4.6 mEq/L (ref 3.5–5.1)
Sodium: 138 mEq/L (ref 135–145)
TOTAL PROTEIN: 7.2 g/dL (ref 6.0–8.3)
Total Bilirubin: 0.6 mg/dL (ref 0.2–1.2)

## 2013-08-22 LAB — URIC ACID: Uric Acid, Serum: 9.1 mg/dL — ABNORMAL HIGH (ref 4.0–7.8)

## 2013-08-22 LAB — TSH: TSH: 0.63 u[IU]/mL (ref 0.35–4.50)

## 2013-08-22 MED ORDER — ALLOPURINOL 300 MG PO TABS: 450.0000 mg | ORAL_TABLET | Freq: Every day | ORAL | Status: DC

## 2013-08-22 NOTE — Patient Instructions (Signed)
Go to the lab on the way out.  We'll contact you with your lab report. Take care.  I'll be thinking about your and your family.  Don't change your meds for now.  I'm always glad to see you.

## 2013-08-22 NOTE — Assessment & Plan Note (Signed)
Sounds to be NSR today, continue as is.  Pt agrees.

## 2013-08-22 NOTE — Assessment & Plan Note (Signed)
See scanned forms.  Routine anticipatory guidance given to patient.  See health maintenance. Flu 2014 Shingles 2011 PNA 2011 Tetanus 2011 Colon CA screening not indicated due to age.  Pt agrees Prostate cancer screening not indicated due to age.  Pt agrees.  Cognitive function addressed- see scanned forms- and if abnormal then additional documentation follows.  Wife recently with CVA, on hospice at home, minimal PO intake now and limited prognosis.  Condolences offered.  He is coping about as well as could be expected, with family support.

## 2013-08-22 NOTE — Progress Notes (Signed)
Pre visit review using our clinic review tool, if applicable. No additional management support is needed unless otherwise documented below in the visit note.  I have personally reviewed the Medicare Annual Wellness questionnaire and have noted 1. The patient's medical and social history 2. Their use of alcohol, tobacco or illicit drugs 3. Their current medications and supplements 4. The patient's functional ability including ADL's, fall risks, home safety risks and hearing or visual             impairment. 5. Diet and physical activities 6. Evidence for depression or mood disorders  The patients weight, height, BMI have been recorded in the chart and visual acuity is per eye clinic.  I have made referrals, counseling and provided education to the patient based review of the above and I have provided the pt with a written personalized care plan for preventive services.  See scanned forms.  Routine anticipatory guidance given to patient.  See health maintenance. Flu 2014 Shingles 2011 PNA 2011 Tetanus 2011 Colon CA screening not indicated due to age.  Pt agrees Prostate cancer screening not indicated due to age.  Pt agrees.  Cognitive function addressed- see scanned forms- and if abnormal then additional documentation follows.  Wife recently with CVA, on hospice at home, minimal PO intake now and limited prognosis.  Condolences offered.  He is coping about as well as could be expected, with family support.   Gout.  occ flares.  Compliant with allopurinol.  See notes on labs.  Colchicine helps with flares.  On gout diet.   H/o PAF, rare sx, can take an extra dose of BB prn, but not needed frequently at all. Continues on baseline meds.   PMH and SH reviewed  Meds, vitals, and allergies reviewed.   ROS: See HPI.  Otherwise negative.    GEN: nad, alert and oriented HEENT: mucous membranes moist NECK: supple w/o LA CV: rrr. PULM: ctab, no inc wob ABD: soft, +bs EXT: no edema SKIN:  no acute rash

## 2013-08-22 NOTE — Assessment & Plan Note (Signed)
Compliant with CPAP per patient report.

## 2013-08-22 NOTE — Assessment & Plan Note (Signed)
See notes on labs.  Still with occ flares.

## 2013-08-28 ENCOUNTER — Ambulatory Visit (INDEPENDENT_AMBULATORY_CARE_PROVIDER_SITE_OTHER): Payer: Medicare Other | Admitting: *Deleted

## 2013-08-28 DIAGNOSIS — I4891 Unspecified atrial fibrillation: Secondary | ICD-10-CM

## 2013-08-28 DIAGNOSIS — Z5181 Encounter for therapeutic drug level monitoring: Secondary | ICD-10-CM | POA: Diagnosis not present

## 2013-08-28 LAB — POCT INR: INR: 3

## 2013-09-22 ENCOUNTER — Other Ambulatory Visit (INDEPENDENT_AMBULATORY_CARE_PROVIDER_SITE_OTHER): Payer: Medicare Other

## 2013-09-22 DIAGNOSIS — M109 Gout, unspecified: Secondary | ICD-10-CM

## 2013-09-22 DIAGNOSIS — I10 Essential (primary) hypertension: Secondary | ICD-10-CM

## 2013-09-22 LAB — BASIC METABOLIC PANEL
BUN: 23 mg/dL (ref 6–23)
CO2: 25 meq/L (ref 19–32)
Calcium: 9.1 mg/dL (ref 8.4–10.5)
Chloride: 101 mEq/L (ref 96–112)
Creatinine, Ser: 1.5 mg/dL (ref 0.4–1.5)
GFR: 48.14 mL/min — ABNORMAL LOW (ref >60.00)
GLUCOSE: 114 mg/dL — AB (ref 70–99)
POTASSIUM: 4.4 meq/L (ref 3.5–5.1)
SODIUM: 135 meq/L (ref 135–145)

## 2013-09-22 LAB — URIC ACID: Uric Acid, Serum: 5.7 mg/dL (ref 4.0–7.8)

## 2013-09-28 ENCOUNTER — Ambulatory Visit (INDEPENDENT_AMBULATORY_CARE_PROVIDER_SITE_OTHER): Payer: Medicare Other | Admitting: Cardiovascular Disease

## 2013-09-28 ENCOUNTER — Ambulatory Visit (INDEPENDENT_AMBULATORY_CARE_PROVIDER_SITE_OTHER): Payer: Medicare Other | Admitting: *Deleted

## 2013-09-28 ENCOUNTER — Encounter: Payer: Self-pay | Admitting: Cardiovascular Disease

## 2013-09-28 VITALS — BP 142/70 | HR 61 | Ht 66.0 in | Wt 209.8 lb

## 2013-09-28 DIAGNOSIS — I4891 Unspecified atrial fibrillation: Secondary | ICD-10-CM

## 2013-09-28 DIAGNOSIS — Z5181 Encounter for therapeutic drug level monitoring: Secondary | ICD-10-CM

## 2013-09-28 DIAGNOSIS — I2510 Atherosclerotic heart disease of native coronary artery without angina pectoris: Secondary | ICD-10-CM | POA: Diagnosis not present

## 2013-09-28 LAB — POCT INR: INR: 3.1

## 2013-09-28 NOTE — Progress Notes (Signed)
HPI:  80 year old gentleman presenting for followup of paroxysmal atrial fibrillation. The patient has been maintained on chronic anticoagulation with warfarin. He underwent cardiac catheterization in 2010 demonstrating mild nonobstructive CAD primarily affecting the left circumflex with less than 50% stenosis in that vessel. His LV EF has been preserved. The patient presented in January with lightheadedness and palpitations. His symptoms were fairly transient. We considered an outpatient event recorder but decided to follow him symptomatically. He returns today for followup evaluation.  The patient's wife of 57 years passed away recently from a stroke. He has 3 children who live in the area and he spends a lot of time with them. From a cardiac perspective, he denies chest pain, shortness of breath, or leg swelling. He has rare episodes of lightheadedness and palpitations. He associates this with his atrial fibrillation. He's had 2 such episodes since I saw him in January. Both episodes were transient. He has no other complaints. He's tolerating warfarin without bleeding problems.   Outpatient Encounter Prescriptions as of 09/28/2013  Medication Sig  . allopurinol (ZYLOPRIM) 300 MG tablet Take 1.5 tablets (450 mg total) by mouth daily.  [name] COLCRYS 0.6 MG tablet TAKE 1 TABLET BY MOUTH TWICE A DAY AS NEEDED  . diltiazem (CARDIZEM CD) 120 MG 24 hr capsule TAKE ONE CAPSULE BY MOUTH DAILY  . lisinopril (PRINIVIL,ZESTRIL) 40 MG tablet TAKE 1 TABLET EVERY DAY  . metoprolol tartrate (LOPRESSOR) 25 MG tablet Take 12.5 mg by mouth 2 (two) times daily.   [name] omeprazole (PRILOSEC OTC) 20 MG tablet Take 1 tablet (20 mg total) by mouth daily as needed.  . warfarin (COUMADIN) 4 MG tablet 1 tablet daily except 1.5 tablet on Sundays, Tuesdays and Thursdays or as directed by coumadin clinic    Allergies  Allergen Reactions  . Penicillins Shortness Of Breath and Swelling  . Doxycycline     GI upset    Past  Medical History  Diagnosis Date  . Atrial fibrillation   . Hypertension   . Coronary artery disease   . Esophageal reflux   . Gout, unspecified   . Obesity, unspecified   . Paroxysmal nocturnal dyspnea   . Orthopnea   . Diverticulosis   . Vertigo   . Hyperlipidemia   . OSA (obstructive sleep apnea)     CPAP at night  . Gastric ulcer     on EGD 2013    ROS: Negative except as per HPI  Ht 5\' 6"  (1.676 m)  PHYSICAL EXAM: Pt is alert and oriented, pleasant obese male in NAD HEENT: normal Neck: JVP - normal, carotids 2+= without bruits Lungs: CTA bilaterally CV: RRR without murmur or gallop Abd: soft, NT, Positive BS, obese Ext: no C/C/E, distal pulses intact and equal Skin: warm/dry no rash  EKG:  Normal sinus rhythm 61 beats per minute, within normal limits.  ASSESSMENT AND PLAN: 1. Paroxysmal atrial fibrillation. The patient is stable in sinus rhythm. He has rare episodes of what may be symptomatic atrial fib. He takes metoprolol and symptoms are short-lived. Will continue with the same approach. I will see him back in one year for followup. He understands his obesity and sleep apnea contributing to atrial fib.  2. Nonobstructive CAD. No anginal symptoms. We'll continue with observation. He is not on aspirin because of long-term anticoagulation with warfarin.  3. Hypertension. Blood pressure is controlled on diltiazem and lisinopril as well as low-dose metoprolol.  4. Long-term use of anticoagulants. No bleeding problems reported. His INRs  have been stable.  Sherren Mocha 09/28/2013 7:58 AM

## 2013-09-28 NOTE — Patient Instructions (Signed)
Your physician recommends that you continue on your current medications as directed. Please refer to the Current Medication list given to you today.  Your physician wants you to follow-up in: 1 year with Dr. Cooper.  You will receive a reminder letter in the mail two months in advance. If you don't receive a letter, please call our office to schedule the follow-up appointment.   

## 2013-10-30 ENCOUNTER — Other Ambulatory Visit: Payer: Self-pay | Admitting: Cardiovascular Disease

## 2013-11-09 ENCOUNTER — Ambulatory Visit (INDEPENDENT_AMBULATORY_CARE_PROVIDER_SITE_OTHER): Payer: Medicare Other

## 2013-11-09 DIAGNOSIS — Z5181 Encounter for therapeutic drug level monitoring: Secondary | ICD-10-CM

## 2013-11-09 DIAGNOSIS — I4891 Unspecified atrial fibrillation: Secondary | ICD-10-CM

## 2013-11-09 LAB — POCT INR: INR: 2.5

## 2013-11-29 ENCOUNTER — Other Ambulatory Visit: Payer: Self-pay | Admitting: Cardiovascular Disease

## 2013-12-21 ENCOUNTER — Ambulatory Visit (INDEPENDENT_AMBULATORY_CARE_PROVIDER_SITE_OTHER): Payer: Medicare Other

## 2013-12-21 DIAGNOSIS — Z5181 Encounter for therapeutic drug level monitoring: Secondary | ICD-10-CM | POA: Diagnosis not present

## 2013-12-21 DIAGNOSIS — I4891 Unspecified atrial fibrillation: Secondary | ICD-10-CM | POA: Diagnosis not present

## 2013-12-21 LAB — POCT INR: INR: 2.3

## 2013-12-30 DIAGNOSIS — Z23 Encounter for immunization: Secondary | ICD-10-CM | POA: Diagnosis not present

## 2014-02-01 ENCOUNTER — Ambulatory Visit (INDEPENDENT_AMBULATORY_CARE_PROVIDER_SITE_OTHER): Payer: Medicare Other

## 2014-02-01 DIAGNOSIS — Z5181 Encounter for therapeutic drug level monitoring: Secondary | ICD-10-CM | POA: Diagnosis not present

## 2014-02-01 DIAGNOSIS — I4891 Unspecified atrial fibrillation: Secondary | ICD-10-CM | POA: Diagnosis not present

## 2014-02-01 LAB — POCT INR: INR: 2

## 2014-02-23 ENCOUNTER — Other Ambulatory Visit: Payer: Self-pay | Admitting: Cardiovascular Disease

## 2014-03-12 DIAGNOSIS — R42 Dizziness and giddiness: Secondary | ICD-10-CM | POA: Diagnosis not present

## 2014-03-12 DIAGNOSIS — R51 Headache: Secondary | ICD-10-CM | POA: Diagnosis not present

## 2014-03-13 ENCOUNTER — Encounter: Payer: Self-pay | Admitting: Family Medicine

## 2014-03-13 ENCOUNTER — Ambulatory Visit (INDEPENDENT_AMBULATORY_CARE_PROVIDER_SITE_OTHER): Payer: Medicare Other | Admitting: Family Medicine

## 2014-03-13 VITALS — BP 132/68 | HR 71 | Temp 98.1°F | Wt 211.8 lb

## 2014-03-13 DIAGNOSIS — R6889 Other general symptoms and signs: Secondary | ICD-10-CM

## 2014-03-13 DIAGNOSIS — Z9989 Dependence on other enabling machines and devices: Secondary | ICD-10-CM

## 2014-03-13 DIAGNOSIS — R0989 Other specified symptoms and signs involving the circulatory and respiratory systems: Secondary | ICD-10-CM

## 2014-03-13 DIAGNOSIS — R198 Other specified symptoms and signs involving the digestive system and abdomen: Secondary | ICD-10-CM | POA: Diagnosis not present

## 2014-03-13 DIAGNOSIS — H8110 Benign paroxysmal vertigo, unspecified ear: Secondary | ICD-10-CM

## 2014-03-13 DIAGNOSIS — G4733 Obstructive sleep apnea (adult) (pediatric): Secondary | ICD-10-CM | POA: Diagnosis not present

## 2014-03-13 DIAGNOSIS — R51 Headache: Secondary | ICD-10-CM

## 2014-03-13 DIAGNOSIS — R519 Headache, unspecified: Secondary | ICD-10-CM

## 2014-03-13 DIAGNOSIS — I2510 Atherosclerotic heart disease of native coronary artery without angina pectoris: Secondary | ICD-10-CM | POA: Diagnosis not present

## 2014-03-13 NOTE — Progress Notes (Signed)
Pre visit review using our clinic review tool, if applicable. No additional management support is needed unless otherwise documented below in the visit note.  Still on warfarin.   Initially was feeling slightly dizzy, felt unsteady, when he rolled over in the bed.  Seems to be originating from the L ear/L side of the head.  Noted looking down or looking up.   L sided scalp pain and L occiput pain noted.   Cough and sputum, likely postnasal gtt, white.  Frequent throat clearing.  No ear pain usually but had a dull ache for a few nights.   He had ENT eval yesterday with Dr. Lucia Gaskins, unremarkable exam per patient report.   He can reproduce the dizzy sensation when laying down.    Meds, vitals, and allergies reviewed.   ROS: See HPI.  Otherwise, noncontributory.  GEN: nad, alert and oriented HEENT: mucous membranes moist, tm w/o erythema, nasal exam w/o erythema, scant clear discharge noted,  OP without cobblestoning NECK: supple w/o LA, slightly ttp at the L occiput . CV: rrr.   PULM: ctab, no inc wob EXT: no edema SKIN: no acute rash

## 2014-03-13 NOTE — Patient Instructions (Signed)
It looks like the pain on the back of your neck on the left side is related to the pain on the left side of your scalp- a nerve can get pinched and the pain can radiation up your scalp.  Use tylenol for the pain and gently massage your neck.  Alternate heat and ice.  That should help.  Try using OTC flonase, 2 sprays in each nostril once a day.  That may help with the drainage/AM throat clearing.  Keep using the neck exercises from the ENT clinic to help with the unsteady sensation.  Take care. Glad to see you.

## 2014-03-14 DIAGNOSIS — R51 Headache: Principal | ICD-10-CM

## 2014-03-14 DIAGNOSIS — R051 Acute cough: Secondary | ICD-10-CM | POA: Insufficient documentation

## 2014-03-14 DIAGNOSIS — R519 Headache, unspecified: Secondary | ICD-10-CM | POA: Insufficient documentation

## 2014-03-14 DIAGNOSIS — R059 Cough, unspecified: Secondary | ICD-10-CM | POA: Insufficient documentation

## 2014-03-14 DIAGNOSIS — H8110 Benign paroxysmal vertigo, unspecified ear: Secondary | ICD-10-CM | POA: Insufficient documentation

## 2014-03-14 DIAGNOSIS — R05 Cough: Secondary | ICD-10-CM | POA: Insufficient documentation

## 2014-03-14 NOTE — Assessment & Plan Note (Signed)
likely causing the L scalp pain. That pain comes to the midline and makes a sharply demarcated stop. D/w pt about anatomy. Local heat/ice, massage, stretching, f/u prn.  He agrees. Likely unrelated to the other issues. No recent trauma or alarming sx.

## 2014-03-14 NOTE — Assessment & Plan Note (Signed)
He's already seen ENT and has the bedside exercises to use at home.  D/w pt.

## 2014-03-14 NOTE — Assessment & Plan Note (Signed)
D/w pt.  He could have nocturnal post nasal gtt with more following AM sx, add on flonase and f/u prn.  He agrees.

## 2014-03-15 ENCOUNTER — Ambulatory Visit (INDEPENDENT_AMBULATORY_CARE_PROVIDER_SITE_OTHER): Payer: Medicare Other | Admitting: Pharmacist Clinician (PhC)/ Clinical Pharmacy Specialist

## 2014-03-15 DIAGNOSIS — I4891 Unspecified atrial fibrillation: Secondary | ICD-10-CM

## 2014-03-15 DIAGNOSIS — Z5181 Encounter for therapeutic drug level monitoring: Secondary | ICD-10-CM

## 2014-03-15 LAB — POCT INR: INR: 2.4

## 2014-03-19 ENCOUNTER — Telehealth: Payer: Self-pay | Admitting: *Deleted

## 2014-03-19 NOTE — Telephone Encounter (Signed)
Pt called stating he has an appt with Dr Sabra Heck the dentist for consultation tomorrow for dental extraction and pt instructed to ask dentist how long needs to be off coumadin and then have to get cardiac clearance from Dr Burt Knack as well. Pt instructed to call tomorrow with update information or have the dentist office call and he states understanding

## 2014-03-24 ENCOUNTER — Other Ambulatory Visit: Payer: Self-pay | Admitting: Cardiovascular Disease

## 2014-03-29 ENCOUNTER — Other Ambulatory Visit: Payer: Self-pay | Admitting: Cardiovascular Disease

## 2014-04-24 DIAGNOSIS — H5203 Hypermetropia, bilateral: Secondary | ICD-10-CM | POA: Diagnosis not present

## 2014-04-24 DIAGNOSIS — H524 Presbyopia: Secondary | ICD-10-CM | POA: Diagnosis not present

## 2014-04-24 DIAGNOSIS — H2513 Age-related nuclear cataract, bilateral: Secondary | ICD-10-CM | POA: Diagnosis not present

## 2014-04-24 DIAGNOSIS — H52223 Regular astigmatism, bilateral: Secondary | ICD-10-CM | POA: Diagnosis not present

## 2014-04-25 ENCOUNTER — Ambulatory Visit (INDEPENDENT_AMBULATORY_CARE_PROVIDER_SITE_OTHER): Payer: Medicare Other | Admitting: *Deleted

## 2014-04-25 DIAGNOSIS — I4891 Unspecified atrial fibrillation: Secondary | ICD-10-CM | POA: Diagnosis not present

## 2014-04-25 DIAGNOSIS — Z5181 Encounter for therapeutic drug level monitoring: Secondary | ICD-10-CM

## 2014-04-25 LAB — POCT INR: INR: 2.4

## 2014-05-29 DIAGNOSIS — L309 Dermatitis, unspecified: Secondary | ICD-10-CM | POA: Diagnosis not present

## 2014-05-29 DIAGNOSIS — D225 Melanocytic nevi of trunk: Secondary | ICD-10-CM | POA: Diagnosis not present

## 2014-06-06 ENCOUNTER — Ambulatory Visit (INDEPENDENT_AMBULATORY_CARE_PROVIDER_SITE_OTHER): Payer: Medicare Other | Admitting: *Deleted

## 2014-06-06 DIAGNOSIS — Z5181 Encounter for therapeutic drug level monitoring: Secondary | ICD-10-CM | POA: Diagnosis not present

## 2014-06-06 DIAGNOSIS — I4891 Unspecified atrial fibrillation: Secondary | ICD-10-CM | POA: Diagnosis not present

## 2014-06-06 LAB — POCT INR: INR: 2.4

## 2014-06-14 ENCOUNTER — Telehealth: Payer: Self-pay | Admitting: Family Medicine

## 2014-06-14 NOTE — Telephone Encounter (Signed)
Family delivered message from patient.  Has OSA, on CPAP.  Current machine is >5 years ago, malfunctioning.  Needs new machine.   Please send order for replacement CPAP machine to [facility], Dx OSA.   No change in settings o/w.  That should get the process started.  Thanks.

## 2014-06-14 NOTE — Telephone Encounter (Signed)
Order faxed but I suspect [facility] will need a copy of the sleep study and I don't see it in our system.

## 2014-06-21 NOTE — Telephone Encounter (Signed)
Tia with [facility] left v/m requesting signed DWO to be faxed back to [facility] fax # [phone].

## 2014-06-21 NOTE — Telephone Encounter (Signed)
Has this DWO been placed in your In Box.  I don't have it.

## 2014-06-22 NOTE — Telephone Encounter (Signed)
I havent seen anything about this yet

## 2014-06-26 NOTE — Telephone Encounter (Signed)
Faxed form and sent for scanning.

## 2014-07-10 DIAGNOSIS — H2511 Age-related nuclear cataract, right eye: Secondary | ICD-10-CM | POA: Diagnosis not present

## 2014-07-10 DIAGNOSIS — H2512 Age-related nuclear cataract, left eye: Secondary | ICD-10-CM | POA: Diagnosis not present

## 2014-07-10 DIAGNOSIS — H18411 Arcus senilis, right eye: Secondary | ICD-10-CM | POA: Diagnosis not present

## 2014-07-10 DIAGNOSIS — H02839 Dermatochalasis of unspecified eye, unspecified eyelid: Secondary | ICD-10-CM | POA: Diagnosis not present

## 2014-07-10 DIAGNOSIS — H18412 Arcus senilis, left eye: Secondary | ICD-10-CM | POA: Diagnosis not present

## 2014-07-19 ENCOUNTER — Ambulatory Visit (INDEPENDENT_AMBULATORY_CARE_PROVIDER_SITE_OTHER): Payer: Medicare Other | Admitting: *Deleted

## 2014-07-19 DIAGNOSIS — Z5181 Encounter for therapeutic drug level monitoring: Secondary | ICD-10-CM | POA: Diagnosis not present

## 2014-07-19 DIAGNOSIS — I4891 Unspecified atrial fibrillation: Secondary | ICD-10-CM

## 2014-07-19 LAB — POCT INR: INR: 1.9

## 2014-07-23 DIAGNOSIS — H2512 Age-related nuclear cataract, left eye: Secondary | ICD-10-CM | POA: Diagnosis not present

## 2014-07-23 DIAGNOSIS — H25011 Cortical age-related cataract, right eye: Secondary | ICD-10-CM | POA: Diagnosis not present

## 2014-07-23 DIAGNOSIS — H25812 Combined forms of age-related cataract, left eye: Secondary | ICD-10-CM | POA: Diagnosis not present

## 2014-07-24 DIAGNOSIS — H2511 Age-related nuclear cataract, right eye: Secondary | ICD-10-CM | POA: Diagnosis not present

## 2014-08-13 DIAGNOSIS — H2511 Age-related nuclear cataract, right eye: Secondary | ICD-10-CM | POA: Diagnosis not present

## 2014-08-13 DIAGNOSIS — H25811 Combined forms of age-related cataract, right eye: Secondary | ICD-10-CM | POA: Diagnosis not present

## 2014-08-13 DIAGNOSIS — H25012 Cortical age-related cataract, left eye: Secondary | ICD-10-CM | POA: Diagnosis not present

## 2014-08-19 ENCOUNTER — Other Ambulatory Visit: Payer: Self-pay | Admitting: Cardiovascular Disease

## 2014-08-30 ENCOUNTER — Ambulatory Visit (INDEPENDENT_AMBULATORY_CARE_PROVIDER_SITE_OTHER): Payer: Medicare Other | Admitting: *Deleted

## 2014-08-30 DIAGNOSIS — Z5181 Encounter for therapeutic drug level monitoring: Secondary | ICD-10-CM | POA: Diagnosis not present

## 2014-08-30 DIAGNOSIS — I4891 Unspecified atrial fibrillation: Secondary | ICD-10-CM

## 2014-08-30 LAB — POCT INR: INR: 2.7

## 2014-08-31 ENCOUNTER — Encounter: Payer: Self-pay | Admitting: Family Medicine

## 2014-08-31 ENCOUNTER — Ambulatory Visit (INDEPENDENT_AMBULATORY_CARE_PROVIDER_SITE_OTHER): Payer: Medicare Other | Admitting: Family Medicine

## 2014-08-31 VITALS — BP 122/62 | HR 62 | Temp 98.7°F | Wt 213.0 lb

## 2014-08-31 DIAGNOSIS — G4733 Obstructive sleep apnea (adult) (pediatric): Secondary | ICD-10-CM

## 2014-08-31 DIAGNOSIS — Z9989 Dependence on other enabling machines and devices: Secondary | ICD-10-CM

## 2014-08-31 DIAGNOSIS — M109 Gout, unspecified: Secondary | ICD-10-CM | POA: Diagnosis not present

## 2014-08-31 MED ORDER — COLCHICINE 0.6 MG PO TABS: 0.6000 mg | ORAL_TABLET | Freq: Two times a day (BID) | ORAL | Status: DC | PRN

## 2014-08-31 NOTE — Progress Notes (Signed)
Pre visit review using our clinic review tool, if applicable. No additional management support is needed unless otherwise documented below in the visit note.  Gout.  No flares in the last month, but had prolonged sx prev.  He has been taking colchicine for a few days with a flare and that usually helps.  Due for f/u labs, with next set of labs before a CPE.  D/w pt.  He agrees.  He's working on diet.   OSA.  Prev dx'd, on BiPAP for years, had new machine to replace old one, since it was >7 years old.  Using new machine nightly, 100% compliance.  "I can't sleep without it."   Set at 58mm/5mm and doing well.  Sleeping better, not snoring.  He isn't waking at night now.  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  No daytime sleepiness.  No excessive fatigue.    Meds, vitals, and allergies reviewed.   ROS: See HPI.  Otherwise, noncontributory.  GEN: nad, alert and oriented HEENT: mucous membranes moist NECK: supple w/o LA CV: rrr PULM: ctab, no inc wob ABD: soft, +bs EXT: trace edema

## 2014-08-31 NOTE — Patient Instructions (Addendum)
Schedule a physical for the fall.   Take care . Glad to see you.  I'll send papers over to [facility].

## 2014-09-02 ENCOUNTER — Telehealth: Payer: Self-pay | Admitting: Family Medicine

## 2014-09-02 NOTE — Assessment & Plan Note (Signed)
Recheck labs with next CPE.  D/w pt about diet, weight, foods to avoid.  Continue meds as is.

## 2014-09-02 NOTE — Assessment & Plan Note (Signed)
Prev dx'd, on BiPAP for years, had new machine to replace old one, since it was >79 years old.  Using new machine nightly, 100% compliance.  "I can't sleep without it."   Set at 46mm/5mm and doing well.  Sleeping better, not snoring.  He isn't waking at night now.  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  No daytime sleepiness.  No excessive fatigue.  Clearly improved.  Continue as is.

## 2014-09-02 NOTE — Telephone Encounter (Signed)
Please send copy of last OV note to [facility].  I'll give you the contact information tomorrow.  Thanks.

## 2014-09-03 NOTE — Telephone Encounter (Signed)
Faxed last OV note to [facility] [phone]

## 2014-09-14 ENCOUNTER — Telehealth: Payer: Self-pay

## 2014-09-14 NOTE — Telephone Encounter (Signed)
Tachia with [facility] stating pt received bipap before documentation of office visit with bipap. 08/2014 office note is good documentation of bipap but needs use and benefits note in 12 /29/2015 office visit note; can 03/13/14 visit be addened to include bipap info so ins will pay. [place] request cb. Fax # [phone].

## 2014-09-17 ENCOUNTER — Other Ambulatory Visit: Payer: Self-pay | Admitting: Cardiovascular Disease

## 2014-09-17 NOTE — Telephone Encounter (Signed)
I'm okay adding that to the note since he always mentions his use when he comes in for OV.  He is and has been compliant with use.   See addendum.  Thanks.

## 2014-09-17 NOTE — Assessment & Plan Note (Addendum)
He is and has been compliant with use of his device for OSA. He always mentions his use when he comes in for a visit.  Use benefits patient.  It helps.

## 2014-09-18 NOTE — Telephone Encounter (Signed)
Addended note faxed to [phone]

## 2014-09-19 NOTE — Telephone Encounter (Signed)
Duplicate in error

## 2014-09-20 ENCOUNTER — Telehealth: Payer: Self-pay | Admitting: Gastroenterology

## 2014-09-21 NOTE — Telephone Encounter (Signed)
Has choked on foods. Patient is still active and alert. He is taking small bite and hot beverages with the meal. Cold beverages and bread seem to caud=se the most trouble. He will come in on 09/25/14 with his daughter for evaluation. He had dilation in 2013.

## 2014-09-25 ENCOUNTER — Telehealth: Payer: Self-pay | Admitting: *Deleted

## 2014-09-25 ENCOUNTER — Encounter: Payer: Self-pay | Admitting: Gastroenterology

## 2014-09-25 ENCOUNTER — Ambulatory Visit (INDEPENDENT_AMBULATORY_CARE_PROVIDER_SITE_OTHER): Payer: Medicare Other | Admitting: Gastroenterology

## 2014-09-25 VITALS — BP 132/66 | HR 72 | Ht 63.0 in | Wt 209.4 lb

## 2014-09-25 DIAGNOSIS — I480 Paroxysmal atrial fibrillation: Secondary | ICD-10-CM | POA: Diagnosis not present

## 2014-09-25 DIAGNOSIS — Z7901 Long term (current) use of anticoagulants: Secondary | ICD-10-CM | POA: Insufficient documentation

## 2014-09-25 DIAGNOSIS — B9681 Helicobacter pylori [H. pylori] as the cause of diseases classified elsewhere: Secondary | ICD-10-CM | POA: Insufficient documentation

## 2014-09-25 DIAGNOSIS — R1314 Dysphagia, pharyngoesophageal phase: Secondary | ICD-10-CM | POA: Diagnosis not present

## 2014-09-25 DIAGNOSIS — K2980 Duodenitis without bleeding: Secondary | ICD-10-CM

## 2014-09-25 DIAGNOSIS — Z8719 Personal history of other diseases of the digestive system: Secondary | ICD-10-CM | POA: Insufficient documentation

## 2014-09-25 NOTE — Telephone Encounter (Signed)
  09/25/2014   RE: [name] DOB: 29-Nov-1930 MRN: [id number]   Dear Dr Burt Knack,   We have scheduled the above patient for an endoscopic procedure. Our records show that he is on anticoagulation therapy.   Please advise as to how long the patient may come off his therapy of  coumadin prior to the procedure, which is scheduled for  10/09/2014.  Please fax back/ or route the completed form to [place] at [phone].   Sincerely,    Genella Mech

## 2014-09-25 NOTE — Patient Instructions (Signed)
You have been scheduled for an endoscopy. Please follow written instructions given to you at your visit today. If you use inhalers (even only as needed), please bring them with you on the day of your procedure. Your physician has requested that you go to www.startemmi.com and enter the access code given to you at your visit today. This web site gives a general overview about your procedure. However, you should still follow specific instructions given to you by our office regarding your preparation for the procedure.  You will be contaced by our office prior to your procedure for directions on holding your Coumadin/Warfarin.  If you do not hear from our office 1 week prior to your scheduled procedure, please call [phone] to discuss.

## 2014-09-25 NOTE — Progress Notes (Signed)
09/25/2014 [name] [id number] [birth year]   HISTORY OF PRESENT ILLNESS:  This is an 81 year old male who is known to Dr. Deatra Ina.  The patient has history of an esophageal stricture that was dilated with [facility] dilator during EGD in 06/2011 for complaints of dysphagia.  He also had multiple ulcers in the duodenal bulb and descending duodenum as well as an irregular Z-line.  GE junction biopsies showed benign mucosa, negative for Barrett's.  Duodenal biopsies showed positive Hpylori, but unsure if patient was actually ever treated.  He presents to our office today with complaints of recurrent dysphagia to solid foods such as meat and bread.  This has been occurring again for about the past 6 months, but has been getting worse.  Usually when something gets stuck it will eventually come back up.  Has not been taking PPI because he does not feel like he has heartburn/reflux.  He is on coumadin for atrial fibrillation and follows with Dr. Burt Knack.  Cardiac and other medical conditions are stable.   Past Medical History  Diagnosis Date  . Atrial fibrillation   . Hypertension   . Coronary artery disease   . Esophageal reflux   . Gout, unspecified   . Obesity, unspecified   . Paroxysmal nocturnal dyspnea   . Orthopnea   . Diverticulosis   . Vertigo   . Hyperlipidemia   . OSA (obstructive sleep apnea)     CPAP at night  . Gastric ulcer     on EGD 2013   Past Surgical History  Procedure Laterality Date  . Rotator cuff repair  2008    right  . Osa sleep study      per Dr. Brett Fairy  . Carotid dopplers  09/2009    no sig extracranial stenosis and vertebral arteries had antegrade flow  . Mri of brain  09/2009    chronic microvascular ischemia  . Cataract extraction      reports that he quit smoking about 45 years ago. His smoking use included Cigarettes. He quit after 4 years of use. He has never used smokeless tobacco. He reports that he does not drink alcohol or use illicit  drugs. family history includes Dementia in his father; Heart disease in his brother; Hypertension in his father and other; Pneumonia in his father; Stomach cancer in his mother. There is no history of Prostate cancer or Colon cancer. Allergies  Allergen Reactions  . Penicillins Shortness Of Breath and Swelling  . Doxycycline     GI upset      Outpatient Encounter Prescriptions as of 09/25/2014  Medication Sig  . allopurinol (ZYLOPRIM) 300 MG tablet Take 1.5 tablets (450 mg total) by mouth daily.  . colchicine (COLCRYS) 0.6 MG tablet Take 1 tablet (0.6 mg total) by mouth 2 (two) times daily as needed.  . diltiazem (CARDIZEM CD) 120 MG 24 hr capsule TAKE ONE CAPSULE BY MOUTH DAILY  . lisinopril (PRINIVIL,ZESTRIL) 40 MG tablet TAKE 1 TABLET EVERY DAY  . metoprolol tartrate (LOPRESSOR) 25 MG tablet TAKE 1/2 TABLET BY MOUTH TWICE A DAY  . omeprazole (PRILOSEC OTC) 20 MG tablet Take 1 tablet (20 mg total) by mouth daily as needed.  . warfarin (COUMADIN) 4 MG tablet Take as directed by Coumadin clinic   No facility-administered encounter medications on file as of 09/25/2014.     REVIEW OF SYSTEMS  : All other systems reviewed and negative except where noted in the History of Present Illness.  PHYSICAL EXAM: BP 132/66 mmHg  Pulse 72  Ht 5\' 3"  (1.6 m)  Wt 209 lb 6 oz (94.972 kg)  BMI 37.10 kg/m2 General: Well developed white male in no acute distress Head: Normocephalic and atraumatic Eyes:  Sclerae anicteric, conjunctiva pink. Ears: Normal auditory acuity Lungs: Clear throughout to auscultation Heart: Regular rate and rhythm Abdomen: Soft, non-distended.  Normal bowel sounds.  Non-tender. Musculoskeletal: Symmetrical with no gross deformities  Skin: No lesions on visible extremities Extremities: No edema  Neurological: Alert oriented x 4, grossly non-focal Psychological:  Alert and cooperative. Normal mood and affect  ASSESSMENT AND PLAN: -Recurrent dysphagia in an 81 year old  male with history of esophageal stricture requiring dilation during last EGD in 06/2011.  Will schedule EGD with Venia Minks dilation with Dr. Carlean Purl (Dr. Carlean Purl willing to do procedure since patient and his daughter are unwilling to wait until Dr. Kelby Fam next available procedure date several weeks from now).  The risks, benefits, and alternatives were discussed with the patient and he consents to proceed. -Chronic anti-coagulation with coumadin due to atrial fibrillation:  Will hold coumadin for 5 days prior to endoscopic procedures - will instruct when and how to resume after procedure. Benefits and risks of procedure explained including risks of bleeding, perforation, infection, missed lesions, reactions to medications and possible need for hospitalization and surgery for complications. Additional rare but real risk of stroke or other vascular clotting events off coumadin also explained and need to seek urgent help if any signs of these problems occur. Will communicate by phone or EMR with patient's  prescribing provider, Dr. Burt Knack, to confirm that holding coumadin is reasonable in this case.   **Please re-biopsy for Hpylori during procedure since previous pathology showed this but unsure if he was ever actually treated.   CC:  Tonia Ghent, MD

## 2014-09-28 NOTE — Progress Notes (Signed)
Reviewed and agree with management. Robert D. Kaplan, M.D., FACG  

## 2014-10-01 NOTE — Telephone Encounter (Signed)
He may hold warfarin 5 days prior to endoscopy and resume after procedure when safe from GI perspective. thx

## 2014-10-02 NOTE — Telephone Encounter (Signed)
Spoke with patient, ok to hold 5 days

## 2014-10-04 NOTE — Progress Notes (Signed)
Cardiology Office Note Date:  10/07/2014   ID:  [name], DOB [birth year], MRN [id number]  PCP:  Elsie Stain, MD  Cardiologist:  Sherren Mocha, MD    Chief Complaint  Patient presents with  . Follow-up    Atrial Fib    History of Present Illness: [name] is a 81 y.o. male who presents for followup of paroxysmal atrial fibrillation. The patient has been maintained on chronic anticoagulation with warfarin. He underwent cardiac catheterization in 2010 demonstrating mild nonobstructive CAD primarily affecting the left circumflex with less than 50% stenosis in that vessel. His LV EF has been preserved.   He brings in home BP readings today. BP running 120-150's at home. Most readings < 140. Heart rates have been in good range. He's had some episodes of solid food dysphagia and is undergoing endoscopy next week - warfarin is on hold for the procedure. No CP, dyspnea, or leg swelling. States 'my heart is fine.'  Past Medical History  Diagnosis Date  . Atrial fibrillation   . Hypertension   . Coronary artery disease   . Esophageal reflux   . Gout, unspecified   . Obesity, unspecified   . Paroxysmal nocturnal dyspnea   . Orthopnea   . Diverticulosis   . Vertigo   . Hyperlipidemia   . OSA (obstructive sleep apnea)     CPAP at night  . Gastric ulcer     on EGD 2013    Past Surgical History  Procedure Laterality Date  . Rotator cuff repair  2008    right  . Osa sleep study      per Dr. Brett Fairy  . Carotid dopplers  09/2009    no sig extracranial stenosis and vertebral arteries had antegrade flow  . Mri of brain  09/2009    chronic microvascular ischemia  . Cataract extraction      Current Outpatient Prescriptions  Medication Sig Dispense Refill  . allopurinol (ZYLOPRIM) 300 MG tablet Take 1.5 tablets (450 mg total) by mouth daily. 135 tablet 3  . colchicine (COLCRYS) 0.6 MG tablet Take 1 tablet (0.6 mg total) by mouth 2 (two) times daily as needed. 60 tablet  1  . diltiazem (CARDIZEM CD) 120 MG 24 hr capsule TAKE ONE CAPSULE BY MOUTH DAILY 30 capsule 1  . fluocinonide cream (LIDEX) [id number] % Apply 1 application topically 2 (two) times daily.  2  . lisinopril (PRINIVIL,ZESTRIL) 40 MG tablet TAKE 1 TABLET EVERY DAY 30 tablet 0  . metoprolol tartrate (LOPRESSOR) 25 MG tablet TAKE 1/2 TABLET BY MOUTH TWICE A DAY 30 tablet 0  . omeprazole (PRILOSEC OTC) 20 MG tablet Take 1 tablet (20 mg total) by mouth daily as needed. 30 tablet 6  . warfarin (COUMADIN) 4 MG tablet Take as directed by Coumadin clinic 40 tablet 3   No current facility-administered medications for this visit.    Allergies:   Penicillins and Doxycycline   Social History:  The patient  reports that he quit smoking about 45 years ago. His smoking use included Cigarettes. He quit after 4 years of use. He has never used smokeless tobacco. He reports that he does not drink alcohol or use illicit drugs.   Family History:  The patient's  family history includes Dementia in his father; Heart disease in his brother; Hypertension in his father and other; Pneumonia in his father; Stomach cancer in his mother. There is no history of Prostate cancer or Colon cancer.    ROS:  Please see the history of present illness.  Otherwise, review of systems is positive for easy bruising.  All other systems are reviewed and negative.    PHYSICAL EXAM: VS:  BP 120/68 mmHg  Pulse 64  Ht 5\' 3"  (1.6 m)  Wt 210 lb 6.4 oz (95.437 kg)  BMI 37.28 kg/m2 , BMI Body mass index is 37.28 kg/(m^2). GEN: Well nourished, well developed, obese in no acute distress HEENT: normal Neck: no JVD, no masses. No carotid bruits Cardiac: RRR without murmur or gallop                Respiratory:  clear to auscultation bilaterally, normal work of breathing GI: soft, nontender, nondistended, + BS, tight obese abdomen unchanged from normal MS: no deformity or atrophy Ext: no pretibial edema, pedal pulses 2+= bilaterally Skin: warm and  dry, no rash Neuro:  Strength and sensation are intact Psych: euthymic mood, full affect  EKG:  EKG is ordered today. The ekg ordered today shows NSR 64 bpm, within normal limits  Recent Labs: No results found for requested labs within last 365 days.   Lipid Panel     Component Value Date/Time   CHOL 180 08/22/2013 1319   TRIG 290.0* 08/22/2013 1319   HDL 27.70* 08/22/2013 1319   CHOLHDL 6 08/22/2013 1319   VLDL 58.0* 08/22/2013 1319   LDLCALC 94 08/22/2013 1319   LDLDIRECT 103.9 06/16/2012 0806      Wt Readings from Last 3 Encounters:  10/05/14 210 lb 6.4 oz (95.437 kg)  09/25/14 209 lb 6 oz (94.972 kg)  08/31/14 213 lb (96.616 kg)     ASSESSMENT AND PLAN: 1.  Paroxysmal atrial fibrillation: doing well. Notes some irregularity at times on home monitor but no associated symptoms. Continue with strategy of rate-control, anticoagulation.  2. Nonobstructive CAD: no angina. On warfarin so no indication for antiplatelet Rx.  3. Hypertension: home BP's reviewed and in range. Continue current Rx which was reviewed today.  4. Long-term use of anticoagulant drug: no bleeding problems reported. He is compliant with warfarin monitoring.   Current medicines are reviewed with the patient today.  The patient does not have concerns regarding medicines.  Labs/ tests ordered today include:   Orders Placed This Encounter  Procedures  . EKG 12-Lead    Disposition:   FU one year  Signed, Sherren Mocha, MD  10/07/2014 11:16 PM    [address], [place], [place]  [address] Phone: [phone]; Fax: [phone]

## 2014-10-05 ENCOUNTER — Ambulatory Visit (INDEPENDENT_AMBULATORY_CARE_PROVIDER_SITE_OTHER): Payer: Medicare Other | Admitting: Cardiovascular Disease

## 2014-10-05 ENCOUNTER — Encounter: Payer: Self-pay | Admitting: Cardiovascular Disease

## 2014-10-05 ENCOUNTER — Encounter (INDEPENDENT_AMBULATORY_CARE_PROVIDER_SITE_OTHER): Payer: Medicare Other

## 2014-10-05 VITALS — BP 120/68 | HR 64 | Ht 63.0 in | Wt 210.4 lb

## 2014-10-05 DIAGNOSIS — E7800 Pure hypercholesterolemia, unspecified: Secondary | ICD-10-CM

## 2014-10-05 DIAGNOSIS — I480 Paroxysmal atrial fibrillation: Secondary | ICD-10-CM | POA: Diagnosis not present

## 2014-10-05 DIAGNOSIS — I2510 Atherosclerotic heart disease of native coronary artery without angina pectoris: Secondary | ICD-10-CM | POA: Diagnosis not present

## 2014-10-05 DIAGNOSIS — I4891 Unspecified atrial fibrillation: Secondary | ICD-10-CM

## 2014-10-05 NOTE — Patient Instructions (Signed)

## 2014-10-07 ENCOUNTER — Encounter: Payer: Self-pay | Admitting: Cardiovascular Disease

## 2014-10-09 ENCOUNTER — Ambulatory Visit (AMBULATORY_SURGERY_CENTER): Payer: Medicare Other | Admitting: Internal Medicine

## 2014-10-09 ENCOUNTER — Encounter: Payer: Self-pay | Admitting: Internal Medicine

## 2014-10-09 VITALS — BP 133/76 | HR 59 | Temp 97.3°F | Resp 20 | Ht 63.0 in | Wt 209.0 lb

## 2014-10-09 DIAGNOSIS — K3189 Other diseases of stomach and duodenum: Secondary | ICD-10-CM

## 2014-10-09 DIAGNOSIS — K2950 Unspecified chronic gastritis without bleeding: Secondary | ICD-10-CM | POA: Diagnosis not present

## 2014-10-09 DIAGNOSIS — R1319 Other dysphagia: Secondary | ICD-10-CM

## 2014-10-09 DIAGNOSIS — K222 Esophageal obstruction: Secondary | ICD-10-CM | POA: Diagnosis not present

## 2014-10-09 DIAGNOSIS — I10 Essential (primary) hypertension: Secondary | ICD-10-CM | POA: Diagnosis not present

## 2014-10-09 DIAGNOSIS — R1310 Dysphagia, unspecified: Secondary | ICD-10-CM

## 2014-10-09 DIAGNOSIS — R1314 Dysphagia, pharyngoesophageal phase: Secondary | ICD-10-CM | POA: Diagnosis not present

## 2014-10-09 MED ORDER — SODIUM CHLORIDE 0.9 % IV SOLN: 500.0000 mL | INTRAVENOUS | Status: DC

## 2014-10-09 MED ORDER — OMEPRAZOLE 40 MG PO CPDR: 40.0000 mg | DELAYED_RELEASE_CAPSULE | Freq: Every day | ORAL | Status: DC

## 2014-10-09 NOTE — Progress Notes (Signed)
Called to room to assist during endoscopic procedure.  Patient ID and intended procedure confirmed with present staff. Received instructions for my participation in the procedure from the performing physician.  

## 2014-10-09 NOTE — Op Note (Signed)
[place]  [address] [place], [address]   ENDOSCOPY PROCEDURE REPORT  PATIENT: [name], [name]  MR#: [id number] BIRTHDATE: January 13, 1931 , 70  yrs. old GENDER: male ENDOSCOPIST: [name], MD, [facility] PROCEDURE DATE:  10/09/2014 PROCEDURE:  EGD w/ biopsy and EGD w/ balloon dilation ASA CLASS:     Class II INDICATIONS:  dysphagia. MEDICATIONS: Propofol 200 mg IV and Monitored anesthesia care TOPICAL ANESTHETIC: none  DESCRIPTION OF PROCEDURE: After the risks benefits and alternatives of the procedure were thoroughly explained, informed consent was obtained.  The LB [id number] endoscope was introduced through the mouth and advanced to the second portion of the duodenum , Without limitations.  The instrument was slowly withdrawn as the mucosa was fully examined.    1) benign-appearing stricture at GE junction dilated w/ 13.5, 14.5 and 15.5 mm balloon - scope then passed. 2) small hiatal hernia 3) small submucosal antral nodule - biopsied 4) Otherwise normal. The scope was then withdrawn from the patient and the procedure completed.  COMPLICATIONS: There were no immediate complications.  ENDOSCOPIC IMPRESSION: 1) benign-appearing stricture at GE junction dilated w/ 13.5, 14.5 and 15.5 mm balloon - scope then passed. 2) small hiatal hernia 3) small submucosal antral nodule - biopsied 4) Otherwise normal  RECOMMENDATIONS: 1.  Change omeprazole from 20 mg prn to 40 mg daily 2.  Await biopsy results 3.  Will call results and query re: dysphagia - could need another dilation to go towards 18 mm 4.   resume warfarin 2x dose tonight then regular dose and schedule - get INR 1 week   eSigned:  [name], MD, [name] 10/09/2014 9:11 AM    CC: Dr. Elsie Stain and The patient

## 2014-10-09 NOTE — Progress Notes (Signed)
Transferred to recovery room. A/O x3, pleased with MAC.  VSS.  Report to April, RN. 

## 2014-10-09 NOTE — Patient Instructions (Addendum)
There was a narrow area at the end of the esophagus - called a stricture. I dilated it and you should swallow better. You may need another dilation to help it more.  You also had a small nodule in the stomach that I biopsied and will let you know the results by phone.  I have sent a new prescription for omeprazole 40 mg to be taken every day no matter what as acid reflux is causing the stricture and you need to reduce stomach acid.  Restart warfarin tonight. Take twice normal dose tonight and then restart usual dosing tomorrow and go to anti-coagulation clinic in 1 week to get INR checked.  I appreciate the opportunity to care for you. [name], MD, FACG  YOU HAD AN ENDOSCOPIC PROCEDURE TODAY AT [facility]:   Refer to the procedure report that was given to you for any specific questions about what was found during the examination.  If the procedure report does not answer your questions, please call your gastroenterologist to clarify.  If you requested that your care partner not be given the details of your procedure findings, then the procedure report has been included in a sealed envelope for you to review at your convenience later.  YOU SHOULD EXPECT: Some feelings of bloating in the abdomen. Passage of more gas than usual.  Walking can help get rid of the air that was put into your GI tract during the procedure and reduce the bloating. If you had a lower endoscopy (such as a colonoscopy or flexible sigmoidoscopy) you may notice spotting of blood in your stool or on the toilet paper. If you underwent a bowel prep for your procedure, you may not have a normal bowel movement for a few days.  Please Note:  You might notice some irritation and congestion in your nose or some drainage.  This is from the oxygen used during your procedure.  There is no need for concern and it should clear up in a day or so.  SYMPTOMS TO REPORT IMMEDIATELY:   Following upper  endoscopy (EGD)  Vomiting of blood or coffee ground material  New chest pain or pain under the shoulder blades  Painful or persistently difficult swallowing  New shortness of breath  Fever of 100F or higher  Black, tarry-looking stools  For urgent or emergent issues, a gastroenterologist can be reached at any hour by calling [phone].   DIET: See dilation diet handout - Drink plenty of fluids but you should avoid alcoholic beverages for 24 hours.  ACTIVITY:  You should plan to take it easy for the rest of today and you should NOT DRIVE or use heavy machinery until tomorrow (because of the sedation medicines used during the test).    FOLLOW UP: Our staff will call the number listed on your records the next business day following your procedure to check on you and address any questions or concerns that you may have regarding the information given to you following your procedure. If we do not reach you, we will leave a message.  However, if you are feeling well and you are not experiencing any problems, there is no need to return our call.  We will assume that you have returned to your regular daily activities without incident.  If any biopsies were taken you will be contacted by phone or by letter within the next 1-3 weeks.  Please call us at [phone] if you have not heard about the  biopsies in 3 weeks.    SIGNATURES/CONFIDENTIALITY: You and/or your care partner have signed paperwork which will be entered into your electronic medical record.  These signatures attest to the fact that that the information above on your After Visit Summary has been reviewed and is understood.  Full responsibility of the confidentiality of this discharge information lies with you and/or your care-partner.  40 mg omeprazole daily.  Resume warfarin 2x dose tonight then regular dose tomorrow, schedule INR in 1 week.  Stricture, dilation diet, hiatal hernia-handouts given

## 2014-10-10 ENCOUNTER — Other Ambulatory Visit: Payer: Self-pay | Admitting: Family Medicine

## 2014-10-10 ENCOUNTER — Telehealth: Payer: Self-pay | Admitting: *Deleted

## 2014-10-10 DIAGNOSIS — M1A0711 Idiopathic chronic gout, right ankle and foot, with tophus (tophi): Secondary | ICD-10-CM | POA: Diagnosis not present

## 2014-10-10 DIAGNOSIS — M79674 Pain in right toe(s): Secondary | ICD-10-CM | POA: Diagnosis not present

## 2014-10-10 DIAGNOSIS — M109 Gout, unspecified: Secondary | ICD-10-CM | POA: Diagnosis not present

## 2014-10-10 DIAGNOSIS — M2012 Hallux valgus (acquired), left foot: Secondary | ICD-10-CM | POA: Diagnosis not present

## 2014-10-10 NOTE — Telephone Encounter (Signed)
  Follow up Call-  Call back number 10/09/2014  Post procedure Call Back phone  # [phone]  Permission to leave phone message Yes     Patient questions:  Do you have a fever, pain , or abdominal swelling? No. Pain Score  0 *  Have you tolerated food without any problems? Yes.    Have you been able to return to your normal activities? Yes.    Do you have any questions about your discharge instructions: Diet   No. Medications  No. Follow up visit  Yes.   questions answered Do you have questions or concerns about your Care? No.  Actions: * If pain score is 4 or above: No action needed, pain <4.

## 2014-10-10 NOTE — Telephone Encounter (Signed)
Received refill request electronically See drug warning with Warfarin Is okay to refill medication?

## 2014-10-11 NOTE — Telephone Encounter (Signed)
Sent. Thanks.   

## 2014-10-15 ENCOUNTER — Other Ambulatory Visit: Payer: Self-pay | Admitting: Cardiovascular Disease

## 2014-10-16 ENCOUNTER — Telehealth: Payer: Self-pay | Admitting: Internal Medicine

## 2014-10-16 NOTE — Telephone Encounter (Signed)
Patient's daughter notified that the biopsies were negative and that once Dr. Carlean Purl has a chance to review he will call or send a letter

## 2014-10-17 NOTE — Progress Notes (Signed)
Quick Note:  Biopsies show mild inflammation - not a problem Please call and ask if he is still having swallowing problems (any in last week?) Let me know  [facility] - no letter or recall ______

## 2014-10-18 DIAGNOSIS — L602 Onychogryphosis: Secondary | ICD-10-CM | POA: Diagnosis not present

## 2014-10-18 DIAGNOSIS — M1A0711 Idiopathic chronic gout, right ankle and foot, with tophus (tophi): Secondary | ICD-10-CM | POA: Diagnosis not present

## 2014-10-18 DIAGNOSIS — M2012 Hallux valgus (acquired), left foot: Secondary | ICD-10-CM | POA: Diagnosis not present

## 2014-10-18 DIAGNOSIS — M79674 Pain in right toe(s): Secondary | ICD-10-CM | POA: Diagnosis not present

## 2014-10-18 NOTE — Progress Notes (Signed)
Quick Note:  Yes but lets hold 3 days not 5 ______

## 2014-10-19 ENCOUNTER — Encounter (HOSPITAL_COMMUNITY): Payer: Self-pay | Admitting: *Deleted

## 2014-10-22 ENCOUNTER — Ambulatory Visit (HOSPITAL_COMMUNITY): Payer: Medicare Other | Admitting: Anesthesiology

## 2014-10-22 ENCOUNTER — Encounter (HOSPITAL_COMMUNITY): Payer: Self-pay | Admitting: Internal Medicine

## 2014-10-22 ENCOUNTER — Ambulatory Visit (HOSPITAL_COMMUNITY)
Admission: RE | Admit: 2014-10-22 | Discharge: 2014-10-22 | Disposition: A | Discharge status: Home / Self Care | Payer: Medicare Other | Source: Ambulatory Visit | Attending: Internal Medicine | Admitting: Internal Medicine

## 2014-10-22 ENCOUNTER — Encounter (HOSPITAL_COMMUNITY)
Admission: RE | Disposition: A | Discharge status: Home / Self Care | Payer: Self-pay | Source: Ambulatory Visit | Attending: Internal Medicine

## 2014-10-22 DIAGNOSIS — I10 Essential (primary) hypertension: Secondary | ICD-10-CM | POA: Insufficient documentation

## 2014-10-22 DIAGNOSIS — K449 Diaphragmatic hernia without obstruction or gangrene: Secondary | ICD-10-CM | POA: Insufficient documentation

## 2014-10-22 DIAGNOSIS — K219 Gastro-esophageal reflux disease without esophagitis: Secondary | ICD-10-CM | POA: Insufficient documentation

## 2014-10-22 DIAGNOSIS — L03115 Cellulitis of right lower limb: Secondary | ICD-10-CM | POA: Diagnosis present

## 2014-10-22 DIAGNOSIS — I2510 Atherosclerotic heart disease of native coronary artery without angina pectoris: Secondary | ICD-10-CM | POA: Insufficient documentation

## 2014-10-22 DIAGNOSIS — Z87891 Personal history of nicotine dependence: Secondary | ICD-10-CM | POA: Diagnosis not present

## 2014-10-22 DIAGNOSIS — K222 Esophageal obstruction: Secondary | ICD-10-CM | POA: Insufficient documentation

## 2014-10-22 DIAGNOSIS — R1310 Dysphagia, unspecified: Secondary | ICD-10-CM | POA: Diagnosis not present

## 2014-10-22 HISTORY — PX: BALLOON DILATION: SHX5330

## 2014-10-22 HISTORY — PX: ESOPHAGOGASTRODUODENOSCOPY (EGD) WITH PROPOFOL: SHX5813

## 2014-10-22 SURGERY — ESOPHAGOGASTRODUODENOSCOPY (EGD) WITH PROPOFOL
Anesthesia: Monitor Anesthesia Care

## 2014-10-22 MED ORDER — BUTAMBEN-TETRACAINE-BENZOCAINE 2-2-14 % EX AERO
INHALATION_SPRAY | CUTANEOUS | Status: DC | PRN
  Administered 2014-10-22: 2 via TOPICAL

## 2014-10-22 MED ORDER — WARFARIN SODIUM 4 MG PO TABS: 4.0000 mg | ORAL_TABLET | Freq: Once | ORAL | Status: DC

## 2014-10-22 MED ORDER — PROPOFOL 10 MG/ML IV BOLUS
INTRAVENOUS | Status: AC
  Filled 2014-10-22: qty 20

## 2014-10-22 MED ORDER — CLINDAMYCIN HCL 300 MG PO CAPS: 300.0000 mg | ORAL_CAPSULE | Freq: Three times a day (TID) | ORAL | Status: AC

## 2014-10-22 MED ORDER — LACTATED RINGERS IV SOLN
Freq: Once | INTRAVENOUS | Status: AC
  Administered 2014-10-22: 1000 mL via INTRAVENOUS

## 2014-10-22 MED ORDER — LACTATED RINGERS IV SOLN
INTRAVENOUS | Status: DC | PRN
  Administered 2014-10-22: 12:00:00 via INTRAVENOUS

## 2014-10-22 MED ORDER — PROPOFOL 10 MG/ML IV BOLUS
INTRAVENOUS | Status: DC | PRN
  Administered 2014-10-22: 30 mg via INTRAVENOUS
  Administered 2014-10-22 (×5): 10 mg via INTRAVENOUS
  Administered 2014-10-22: 20 mg via INTRAVENOUS

## 2014-10-22 SURGICAL SUPPLY — 14 items

## 2014-10-22 NOTE — Transfer of Care (Signed)
Immediate Anesthesia Transfer of Care Note  Patient: [name]  Procedure(s) Performed: Procedure(s): ESOPHAGOGASTRODUODENOSCOPY (EGD) WITH PROPOFOL (N/A) BALLOON DILATION (N/A)  Patient Location: PACU  Anesthesia Type:MAC  Level of Consciousness: sedated  Airway & Oxygen Therapy: Patient Spontanous Breathing and Patient connected to nasal cannula oxygen  Post-op Assessment: Report given to RN and Post -op Vital signs reviewed and stable  Post vital signs: Reviewed and stable  Last Vitals:  Filed Vitals:   10/22/14 1122  BP: 148/75  Temp: 37.4 C  Resp: 21    Complications: No apparent anesthesia complications

## 2014-10-22 NOTE — Progress Notes (Signed)
   In recovery patient and daughters told us of a 2 d hx of R leg anterior pain at proximal tibia and erythema and skin warmth. Exam confirms this - ? Small bite wound also.  Does not have edema or physical signs of DVT or gout. Is tender at proximal tibia edge midline a few cm below knee  Has not had a fever.  D/w Dr. Silvio Pate as Dr. Damita Dunnings not available - will Tx w/ Clindamycin 300 mg tid x 10 d and he has an appt Dr. Damita Dunnings 4 PM tomorrow.  [name], MD, [facility] [phone] (pager) 10/22/2014 1:16 PM

## 2014-10-22 NOTE — H&P (Signed)
[facility] History and Physical   Primary Care Physician:  Elsie Stain, MD   Reason for Procedure:   dilate esophageal stricture  Plan:    Dilate esophageal stricture at endoscopy The risks and benefits as well as alternatives of endoscopic procedure(s) have been discussed and reviewed. All questions answered. The patient agrees to proceed.  HPI: [name] is a 81 y.o. male with dysphagia - had a dilation to 15 mm of esophageal stricture about 2 weeks ago and is 70% better. I recommended repeating EGD with greater dilation. Warfarin has been held again.  Past Medical History  Diagnosis Date  . Atrial fibrillation   . Hypertension   . Coronary artery disease   . Esophageal reflux   . Gout, unspecified   . Obesity, unspecified   . Paroxysmal nocturnal dyspnea   . Orthopnea   . Diverticulosis   . Vertigo   . Hyperlipidemia   . Gastric ulcer     on EGD 2013  . Esophageal stricture from GERD 06/08/2011    With stricture at GE junction on EGD, dilated 06/2011   . OSA (obstructive sleep apnea)     CPAP at night    Past Surgical History  Procedure Laterality Date  . Rotator cuff repair  2008    right  . [place]sa sleep study      per Dr. Brett Fairy  . Carotid dopplers  09/2009    no sig extracranial stenosis and vertebral arteries had antegrade flow  . Mri of brain  09/2009    chronic microvascular ischemia  . Cataract extraction      Prior to Admission medications   Medication Sig Start Date End Date Taking? Authorizing Provider  allopurinol (ZYLOPRIM) 300 MG tablet TAKE 1.5 TABLETS (450 MG TOTAL) BY MOUTH DAILY. 10/11/14  Yes Tonia Ghent, MD  colchicine (COLCRYS) 0.6 MG tablet Take 1 tablet (0.6 mg total) by mouth 2 (two) times daily as needed. Patient taking differently: Take 0.6 mg by mouth 2 (two) times daily as needed (gout).  08/31/14  Yes Tonia Ghent, MD  diltiazem [facility]) 120 MG 24 hr capsule TAKE ONE CAPSULE BY MOUTH DAILY 10/16/14  Yes Sherren Mocha, MD  fluocinonide cream (LIDEX) [id number] % Apply 1 application topically 2 (two) times daily. 07/23/14  Yes Historical Provider, MD  lisinopril (PRINIVIL,ZESTRIL) 40 MG tablet TAKE 1 TABLET EVERY DAY 10/16/14  Yes Sherren Mocha, MD  metoprolol tartrate (LOPRESSOR) 25 MG tablet TAKE 1/2 TABLET BY MOUTH TWICE A DAY 10/16/14  Yes Sherren Mocha, MD  omeprazole (PRILOSEC) 40 MG capsule Take 1 capsule (40 mg total) by mouth daily. 10/09/14  Yes [name], MD  warfarin (COUMADIN) 4 MG tablet Take as directed by Coumadin clinic Patient taking differently: 4-6 mg. 6 mg on Sun, Tues, Thurs and 4 mg on  Mon, Wed, Fri, Sat 08/20/14  Yes Sherren Mocha, MD    No current facility-administered medications for this encounter.    Allergies as of 10/19/2014 - Review Complete 10/19/2014  Allergen Reaction Noted  . Penicillins Shortness Of Breath and Swelling 08/15/2008  . Doxycycline  05/19/2011    Family History  Problem Relation Age of Onset  . Stomach cancer Mother     died in 54's   . Hypertension Father     died in his 81's pna likely dementia  . Dementia Father   . Pneumonia Father   . Heart disease Brother     S/P CABG  . Hypertension Other   .  Prostate cancer Neg Hx   . Colon cancer Neg Hx     History   Social History  . Marital Status: Married    Spouse Name: N/A  . Number of Children: 3  . Years of Education: N/A   Occupational History  . 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 Smoking status: Former Smoker -- 4 years    Types: Cigarettes    Quit date: 03/16/1969  . Smokeless tobacco: Never Used     Comment: 1 pack/week for about 4 years, quit 1971  . Alcohol Use: No     Comment: Quit all alcohol in 1970's  . Drug Use: No  . Sexual Activity: Not on file   Other Topics Concern  . Not on file   Social History Narrative   Lives in [place], married 1953, widowed after 34 years   Retired Administrator, still does Biomedical scientist.    He is not routinely exercising.   Daily caffeine     Review of Systems: All other review of systems negative except as mentioned in the HPI.  Physical Exam:     General:   Alert,  Well-developed, well-nourished, pleasant and cooperative in NAD Lungs:  Clear throughout to auscultation.   Heart:  Regular rate and rhythm; no murmurs, clicks, rubs,  or gallops. Abdomen:  Obese, Soft, nontender and nondistended. Normal bowel sounds.   Neuro/Psych:  Alert and cooperative. Normal mood and affect. A and O x 3   @Carl  Simonne Maffucci, MD, [facility] [phone] (pager) 10/22/2014 11:32 AM@

## 2014-10-22 NOTE — Anesthesia Preprocedure Evaluation (Addendum)
Anesthesia Evaluation  Patient identified by MRN, date of birth, ID band Patient awake    Reviewed: Allergy & Precautions, H&P , NPO status , Patient's Chart, lab work & pertinent test results  History of Anesthesia Complications Negative for: history of anesthetic complications  Airway Mallampati: IV  TM Distance: >3 FB Neck ROM: full    Dental no notable dental hx.    Pulmonary sleep apnea , former smoker,  breath sounds clear to auscultation  Pulmonary exam normal       Cardiovascular hypertension, + CAD (50% stenosis of LCx from 2010 cath) + dysrhythmias (takes coumadin and dilt, metop for rate control and anticoagulation) Atrial Fibrillation Rhythm:Irregular Rate:Normal     Neuro/Psych  Headaches,    GI/Hepatic Neg liver ROS, GERD-  ,Hx of esophageal strictures requiring balloon dilation   Endo/Other  negative endocrine ROS  Renal/GU negative Renal ROS     Musculoskeletal   Abdominal (+) + obese,   Peds  Hematology negative hematology ROS (+)   Anesthesia Other Findings Dr. Burt Knack from cardiology follows patient, last seen 10/04/2014, coumadin will be held for procedure  Previous surgeries and EGD with dilation, no difficulty  Reproductive/Obstetrics negative OB ROS                           Anesthesia Physical Anesthesia Plan  ASA: III  Anesthesia Plan: MAC   Post-op Pain Management:    Induction: Intravenous  Airway Management Planned: Nasal Cannula  Additional Equipment:   Intra-op Plan:   Post-operative Plan:   Informed Consent: I have reviewed the patients History and Physical, chart, labs and discussed the procedure including the risks, benefits and alternatives for the proposed anesthesia with the patient or authorized representative who has indicated his/her understanding and acceptance.     Plan Discussed with: Anesthesiologist, CRNA and Surgeon  Anesthesia  Plan Comments:         Anesthesia Quick Evaluation

## 2014-10-22 NOTE — Anesthesia Postprocedure Evaluation (Signed)
  Anesthesia Post-op Note  Patient: [name]  Procedure(s) Performed: Procedure(s) (LRB): ESOPHAGOGASTRODUODENOSCOPY (EGD) WITH PROPOFOL (N/A) BALLOON DILATION (N/A)  Patient Location: PACU  Anesthesia Type: MAC  Level of Consciousness: awake and alert   Airway and Oxygen Therapy: Patient Spontanous Breathing  Post-op Pain: mild  Post-op Assessment: Post-op Vital signs reviewed, Patient's Cardiovascular Status Stable, Respiratory Function Stable, Patent Airway and No signs of Nausea or vomiting  Last Vitals:  Filed Vitals:   10/22/14 1300  BP: 142/68  Pulse: 72  Temp:   Resp: 17    Post-op Vital Signs: stable   Complications: No apparent anesthesia complications

## 2014-10-22 NOTE — Op Note (Signed)
[facility] [place], [address]   ENDOSCOPY PROCEDURE REPORT  PATIENT: [name], [name]  MR#: [id number] BIRTHDATE: 1930-07-05 , 89  yrs. old GENDER: male ENDOSCOPIST: [name], MD, [facility] PROCEDURE DATE:  10/22/2014 PROCEDURE:  Esophagoscopy   + balloon dilation esophagus ASA CLASS:     Class III INDICATIONS: MEDICATIONS: Monitored anesthesia care and Per Anesthesia TOPICAL ANESTHETIC: Cetacaine Spray  DESCRIPTION OF PROCEDURE: After the risks benefits and alternatives of the procedure were thoroughly explained, informed consent was obtained.  The Pentax Gastroscope [id number] endoscope was introduced through the mouth and advanced to the stomach antrum , Without limitations.  The instrument was slowly withdrawn as the mucosa was fully examined.  1) Smooth benign-appearing stricture at GE junction - dilated to 18 mm with balloon, successful, small heme as expected 2) Small hiatal hernia 3) small submocosal nodule in antrum as before 4) Otherwise normal esophagus and stomach.  Retroflexed views revealed no abnormalities.     The scope was then withdrawn from the patient and the procedure completed.  COMPLICATIONS: There were no immediate complications.  ENDOSCOPIC IMPRESSION: 1) Smooth benign-appearing stricture at GE junction - dilated to 18 mm with balloon, successful, small heme as expected 2) Small hiatal hernia 3) small submocosal nodule in antrum as before 4) Otherwise normal esophagus and stomach  RECOMMENDATIONS: 1.  Clear liquids until 130 PM, then soft foods rest of day.  Resume prior diet tomorrow. 2.  Continue PPI always 3.  If still having persistent dysphagia in October to call back - could have concomitant motility issues also. 4.  Resume warfarin 2x NL dose tonight and tomorrow then usual schedule and get PT/INR about 1 week from today   eSigned:  [name], MD, [facility] 10/22/2014 12:43 PM    CC: Dr. Renford Dills and  The Patient

## 2014-10-22 NOTE — Progress Notes (Signed)
Pt. R leg red and warm and tender to the touch.  Dr. Carlean Purl notified and assessed the leg before pt. Departure.  Orders processed.  Pt and family educated about signs and symptoms of infection and when to seek medical care.  Pt understood instruction's given.   Laverta Baltimore, RN

## 2014-10-22 NOTE — Discharge Instructions (Addendum)
I dilated the stricture to 18 mm so hopefully you will be 100% better now! Your esophagus is 79 years old and may not squeeze 100% normal so could have some difficulty.  Let us know if you are having frequent swallowing problems still in October - should be a lot better by then.  Please restart warfarin and take twice normal dose tonight and tomorrow then resume normal dose and schedule. Get blood checked in 1 week to see how thin it is (anti-coagulation clinic)  Take Clindamycin for the leg infection and see Dr. Damita Dunnings tomorrow as planned  I appreciate the opportunity to care for you. [name], MD, FACG  YOU HAD AN ENDOSCOPIC PROCEDURE TODAY: Refer to the procedure report and other information in the discharge instructions given to you for any specific questions about what was found during the examination. If this information does not answer your questions, please call Dr. Celesta Aver office at [phone] to clarify.   YOU SHOULD EXPECT: Some feelings of bloating in the abdomen. Passage of more gas than usual. Walking can help get rid of the air that was put into your GI tract during the procedure and reduce the bloating. If you had a lower endoscopy (such as a colonoscopy or flexible sigmoidoscopy) you may notice spotting of blood in your stool or on the toilet paper. Some abdominal soreness may be present for a day or two, also.  DIET:  Only clear liquids until 130 PM today then soft foods until tomorrow. Try normal texture food tomorrow.  ACTIVITY: Your care partner should take you home directly after the procedure. You should plan to take it easy, moving slowly for the rest of the day. You can resume normal activity the day after the procedure however YOU SHOULD NOT DRIVE, use power tools, machinery or perform tasks that involve climbing or major physical exertion for 24 hours (because of the sedation medicines used during the test).   SYMPTOMS TO REPORT IMMEDIATELY: A  gastroenterologist can be reached at any hour. Please call [phone]  for any of the following symptoms:  Following lower endoscopy (colonoscopy, flexible sigmoidoscopy) Excessive amounts of blood in the stool  Significant tenderness, worsening of abdominal pains  Swelling of the abdomen that is new, acute  Fever of 100 or higher  Following upper endoscopy (EGD, EUS, ERCP, esophageal dilation) Vomiting of blood or coffee ground material  New, significant abdominal pain  New, significant chest pain or pain under the shoulder blades  Painful or persistently difficult swallowing  New shortness of breath  Black, tarry-looking or red, bloody stools  FOLLOW UP:  If any biopsies were taken you will be contacted by phone or by letter within the next 1-3 weeks. Call [phone]  if you have not heard about the biopsies in 3 weeks.  Please also call with any specific questions about appointments or follow up tests.  Esophagogastroduodenoscopy Care After Refer to this sheet in the next few weeks. These instructions provide you with information on caring for yourself after your procedure. Your caregiver may also give you more specific instructions. Your treatment has been planned according to current medical practices, but problems sometimes occur. Call your caregiver if you have any problems or questions after your procedure.  HOME CARE INSTRUCTIONS  Do not eat or drink anything until the numbing medicine (local anesthetic) has worn off and your gag reflex has returned. You will know that the local anesthetic has worn off when you can swallow comfortably.  Do  not drive for 12 hours after the procedure or as directed by your caregiver.  Only take medicines as directed by your caregiver. SEEK MEDICAL CARE IF:   You cannot stop coughing.  You are not urinating at all or less than usual. SEEK IMMEDIATE MEDICAL CARE IF:  You have difficulty swallowing.  You cannot eat or drink.  You have  worsening throat or chest pain.  You have dizziness, lightheadedness, or you faint.  You have nausea or vomiting.  You have chills.  You have a fever.  You have severe abdominal pain.  You have black, tarry, or bloody stools. Document Released: 02/17/2012 Document Reviewed: 02/17/2012 [facility] Patient Information 2015 [place]. This information is not intended to replace advice given to you by your health care provider. Make sure you discuss any questions you have with your health care provider.   Monitored Anesthesia Care Monitored anesthesia care is an anesthesia service for a medical procedure. Anesthesia is the loss of the ability to feel pain. It is produced by medicines called anesthetics. It may affect a small area of your body (local anesthesia), a large area of your body (regional anesthesia), or your entire body (general anesthesia). The need for monitored anesthesia care depends your procedure, your condition, and the potential need for regional or general anesthesia. It is often provided during procedures where:   General anesthesia may be needed if there are complications. This is because you need special care when you are under general anesthesia.   You will be under local or regional anesthesia. This is so that you are able to have higher levels of anesthesia if needed.   You will receive calming medicines (sedatives). This is especially the case if sedatives are given to put you in a semi-conscious state of relaxation (deep sedation). This is because the amount of sedative needed to produce this state can be hard to predict. Too much of a sedative can produce general anesthesia. Monitored anesthesia care is performed by one or more health care providers who have special training in all types of anesthesia. You will need to meet with these health care providers before your procedure. During this meeting, they will ask you about your medical history. They will also  give you instructions to follow. (For example, you will need to stop eating and drinking before your procedure. You may also need to stop or change medicines you are taking.) During your procedure, your health care providers will stay with you. They will:   Watch your condition. This includes watching your blood pressure, breathing, and level of pain.   Diagnose and treat problems that occur.   Give medicines if they are needed. These may include calming medicines (sedatives) and anesthetics.   Make sure you are comfortable.  Having monitored anesthesia care does not necessarily mean that you will be under anesthesia. It does mean that your health care providers will be able to manage anesthesia if you need it or if it occurs. It also means that you will be able to have a different type of anesthesia than you are having if you need it. When your procedure is complete, your health care providers will continue to watch your condition. They will make sure any medicines wear off before you are allowed to go home.  Document Released: 11/26/2004 Document Revised: 07/17/2013 Document Reviewed: 04/13/2012 [facility] Patient Information 2015 [place], [place]. This information is not intended to replace advice given to you by your health care provider. Make sure you discuss  any questions you have with your health care provider. ° °

## 2014-10-23 ENCOUNTER — Ambulatory Visit (INDEPENDENT_AMBULATORY_CARE_PROVIDER_SITE_OTHER): Payer: Medicare Other | Admitting: Family Medicine

## 2014-10-23 ENCOUNTER — Encounter (HOSPITAL_COMMUNITY): Payer: Self-pay | Admitting: Internal Medicine

## 2014-10-23 ENCOUNTER — Ambulatory Visit (INDEPENDENT_AMBULATORY_CARE_PROVIDER_SITE_OTHER): Payer: Medicare Other | Admitting: *Deleted

## 2014-10-23 VITALS — BP 122/70 | HR 62 | Temp 98.4°F | Wt 209.2 lb

## 2014-10-23 DIAGNOSIS — Z5181 Encounter for therapeutic drug level monitoring: Secondary | ICD-10-CM

## 2014-10-23 DIAGNOSIS — I2510 Atherosclerotic heart disease of native coronary artery without angina pectoris: Secondary | ICD-10-CM

## 2014-10-23 DIAGNOSIS — L03115 Cellulitis of right lower limb: Secondary | ICD-10-CM

## 2014-10-23 DIAGNOSIS — I480 Paroxysmal atrial fibrillation: Secondary | ICD-10-CM

## 2014-10-23 LAB — POCT INR: INR: 1.2

## 2014-10-23 NOTE — Patient Instructions (Signed)
Continue the clindamycin for now and update me tomorrow.   Take care.  Glad to see you.

## 2014-10-23 NOTE — Progress Notes (Signed)
Pre visit review using our clinic review tool, if applicable. No additional management support is needed unless otherwise documented below in the visit note. 

## 2014-10-23 NOTE — Progress Notes (Signed)
Pre visit review using our clinic review tool, if applicable. No additional management support is needed unless otherwise documented below in the visit note.  R shin redness, tender.  Just restarted on coumadin.  Already on clindamycin, started yesterday.  No fevers.  No posterior leg pain.  No L leg sx.  H/o gout noted.  No calf pain.  R leg slightly puffy.  No CP, not SOB, doesn't feel like prev gout flares.   EGD done, no dysphagia with foods tried so far.   Meds, vitals, and allergies reviewed.   ROS: See HPI.  Otherwise, noncontributory.  nad ncat rrr ctab abd soft B calf circ- 40cm and equal Trace RLE edema, none on the LLE Neither calf ttp, no bands, no chords.   DP pulse intact 23x8 cm area on R shin w/o fluctuant mass but pinkish and slightly warm. Not worse since yesterday per patient.

## 2014-10-24 DIAGNOSIS — L0390 Cellulitis, unspecified: Secondary | ICD-10-CM | POA: Insufficient documentation

## 2014-10-24 NOTE — Assessment & Plan Note (Signed)
Likely, border marked.  No need for I&D.  Continue clinda.  He'll update me tomorrow.  See notes on INR.  Still okay for outpatient f/u.  The area is large but not very red and I don't suspect compartment, DVT, etc.

## 2014-10-25 ENCOUNTER — Telehealth: Payer: Self-pay | Admitting: Family Medicine

## 2014-10-25 NOTE — Telephone Encounter (Signed)
Cellulitis has not spread outside the mark, it it looking better, if you have any questions please call [phone]

## 2014-10-25 NOTE — Telephone Encounter (Signed)
Patient advised.

## 2014-10-25 NOTE — Telephone Encounter (Signed)
Great, thanks continue the abx and update me as needed.  I appreciate the update.

## 2014-10-29 ENCOUNTER — Ambulatory Visit (INDEPENDENT_AMBULATORY_CARE_PROVIDER_SITE_OTHER): Payer: Medicare Other

## 2014-10-29 DIAGNOSIS — Z5181 Encounter for therapeutic drug level monitoring: Secondary | ICD-10-CM

## 2014-10-29 DIAGNOSIS — I480 Paroxysmal atrial fibrillation: Secondary | ICD-10-CM | POA: Diagnosis not present

## 2014-10-29 LAB — POCT INR: INR: 1.8

## 2014-10-31 ENCOUNTER — Ambulatory Visit: Payer: Medicare Other | Admitting: Podiatry

## 2014-11-12 ENCOUNTER — Ambulatory Visit (INDEPENDENT_AMBULATORY_CARE_PROVIDER_SITE_OTHER): Payer: Medicare Other | Admitting: *Deleted

## 2014-11-12 DIAGNOSIS — I480 Paroxysmal atrial fibrillation: Secondary | ICD-10-CM

## 2014-11-12 DIAGNOSIS — I4891 Unspecified atrial fibrillation: Secondary | ICD-10-CM

## 2014-11-12 DIAGNOSIS — Z5181 Encounter for therapeutic drug level monitoring: Secondary | ICD-10-CM

## 2014-11-12 LAB — POCT INR: INR: 2.6

## 2014-12-03 ENCOUNTER — Other Ambulatory Visit: Payer: Self-pay | Admitting: Family Medicine

## 2014-12-03 ENCOUNTER — Ambulatory Visit (INDEPENDENT_AMBULATORY_CARE_PROVIDER_SITE_OTHER): Payer: Medicare Other | Admitting: *Deleted

## 2014-12-03 DIAGNOSIS — M109 Gout, unspecified: Secondary | ICD-10-CM

## 2014-12-03 DIAGNOSIS — I480 Paroxysmal atrial fibrillation: Secondary | ICD-10-CM

## 2014-12-03 DIAGNOSIS — Z5181 Encounter for therapeutic drug level monitoring: Secondary | ICD-10-CM | POA: Diagnosis not present

## 2014-12-03 DIAGNOSIS — E7800 Pure hypercholesterolemia, unspecified: Secondary | ICD-10-CM

## 2014-12-03 DIAGNOSIS — I4891 Unspecified atrial fibrillation: Secondary | ICD-10-CM | POA: Diagnosis not present

## 2014-12-03 LAB — POCT INR: INR: 2.7

## 2014-12-06 ENCOUNTER — Other Ambulatory Visit (INDEPENDENT_AMBULATORY_CARE_PROVIDER_SITE_OTHER): Payer: Medicare Other

## 2014-12-06 DIAGNOSIS — E7800 Pure hypercholesterolemia, unspecified: Secondary | ICD-10-CM

## 2014-12-06 DIAGNOSIS — M109 Gout, unspecified: Secondary | ICD-10-CM | POA: Diagnosis not present

## 2014-12-06 DIAGNOSIS — I480 Paroxysmal atrial fibrillation: Secondary | ICD-10-CM

## 2014-12-06 LAB — COMPREHENSIVE METABOLIC PANEL
ALBUMIN: 4.1 g/dL (ref 3.5–5.2)
ALK PHOS: 70 U/L (ref 39–117)
ALT: 21 U/L (ref 0–53)
AST: 21 U/L (ref 0–37)
BILIRUBIN TOTAL: 0.5 mg/dL (ref 0.2–1.2)
BUN: 25 mg/dL — ABNORMAL HIGH (ref 6–23)
CO2: 27 mEq/L (ref 19–32)
Calcium: 9.1 mg/dL (ref 8.4–10.5)
Chloride: 103 mEq/L (ref 96–112)
Creatinine, Ser: 1.35 mg/dL (ref 0.40–1.50)
GFR: 53.79 mL/min — AB (ref >60.00)
Glucose, Bld: 113 mg/dL — ABNORMAL HIGH (ref 70–99)
POTASSIUM: 4.5 meq/L (ref 3.5–5.1)
Sodium: 137 mEq/L (ref 135–145)
Total Protein: 7.3 g/dL (ref 6.0–8.3)

## 2014-12-06 LAB — CBC WITH DIFFERENTIAL/PLATELET
Basophils Absolute: 0 10*3/uL (ref 0.0–0.1)
Basophils Relative: 0.5 % (ref 0.0–3.0)
EOS PCT: 3 % (ref 0.0–5.0)
Eosinophils Absolute: 0.2 10*3/uL (ref 0.0–0.7)
HCT: 43.1 % (ref 39.0–52.0)
Hemoglobin: 14.7 g/dL (ref 13.0–17.0)
LYMPHS ABS: 1.5 10*3/uL (ref 0.7–4.0)
LYMPHS PCT: 25 % (ref 12.0–46.0)
MCHC: 34.1 g/dL (ref 30.0–36.0)
MCV: 87.9 fl (ref 78.0–100.0)
Monocytes Absolute: 0.6 10*3/uL (ref 0.1–1.0)
Monocytes Relative: 10.8 % (ref 3.0–12.0)
NEUTROS ABS: 3.6 10*3/uL (ref 1.4–7.7)
NEUTROS PCT: 60.7 % (ref 43.0–77.0)
Platelets: 159 10*3/uL (ref 150.0–400.0)
RBC: 4.91 Mil/uL (ref 4.22–5.81)
RDW: 14.4 % (ref 11.5–15.5)
WBC: 5.9 10*3/uL (ref 4.0–10.5)

## 2014-12-06 LAB — LDL CHOLESTEROL, DIRECT: Direct LDL: 92 mg/dL

## 2014-12-06 LAB — LIPID PANEL
CHOLESTEROL: 160 mg/dL (ref 0–200)
HDL: 28.3 mg/dL — AB (ref >39.00)
NonHDL: 131.79
TRIGLYCERIDES: 268 mg/dL — AB (ref 0.0–149.0)
Total CHOL/HDL Ratio: 6
VLDL: 53.6 mg/dL — AB (ref 0.0–40.0)

## 2014-12-06 LAB — URIC ACID: Uric Acid, Serum: 9.5 mg/dL — ABNORMAL HIGH (ref 4.0–7.8)

## 2014-12-06 LAB — TSH: TSH: 1.07 u[IU]/mL (ref 0.35–4.50)

## 2014-12-12 ENCOUNTER — Other Ambulatory Visit: Payer: Self-pay | Admitting: Cardiovascular Disease

## 2014-12-13 ENCOUNTER — Encounter: Payer: Medicare Other | Admitting: Family Medicine

## 2014-12-17 ENCOUNTER — Encounter: Payer: Self-pay | Admitting: Family Medicine

## 2014-12-17 ENCOUNTER — Ambulatory Visit (INDEPENDENT_AMBULATORY_CARE_PROVIDER_SITE_OTHER): Payer: Medicare Other | Admitting: Family Medicine

## 2014-12-17 VITALS — BP 118/70 | HR 96 | Temp 98.8°F | Ht 63.5 in | Wt 209.0 lb

## 2014-12-17 DIAGNOSIS — Z23 Encounter for immunization: Secondary | ICD-10-CM | POA: Diagnosis not present

## 2014-12-17 DIAGNOSIS — I10 Essential (primary) hypertension: Secondary | ICD-10-CM | POA: Diagnosis not present

## 2014-12-17 DIAGNOSIS — Z9989 Dependence on other enabling machines and devices: Secondary | ICD-10-CM

## 2014-12-17 DIAGNOSIS — I480 Paroxysmal atrial fibrillation: Secondary | ICD-10-CM

## 2014-12-17 DIAGNOSIS — Z0000 Encounter for general adult medical examination without abnormal findings: Secondary | ICD-10-CM

## 2014-12-17 DIAGNOSIS — G4733 Obstructive sleep apnea (adult) (pediatric): Secondary | ICD-10-CM

## 2014-12-17 DIAGNOSIS — M109 Gout, unspecified: Secondary | ICD-10-CM

## 2014-12-17 DIAGNOSIS — I2510 Atherosclerotic heart disease of native coronary artery without angina pectoris: Secondary | ICD-10-CM | POA: Diagnosis not present

## 2014-12-17 DIAGNOSIS — R739 Hyperglycemia, unspecified: Secondary | ICD-10-CM

## 2014-12-17 DIAGNOSIS — Z7189 Other specified counseling: Secondary | ICD-10-CM

## 2014-12-17 NOTE — Patient Instructions (Signed)
Take care.  Don't change your meds for now but cut back on the whites and the doughnuts.  Glad to see you.

## 2014-12-17 NOTE — Progress Notes (Signed)
Pre visit review using our clinic review tool, if applicable. No additional management support is needed unless otherwise documented below in the visit note.  I have personally reviewed the Medicare Annual Wellness questionnaire and have noted 1. The patient's medical and social history 2. Their use of alcohol, tobacco or illicit drugs 3. Their current medications and supplements 4. The patient's functional ability including ADL's, fall risks, home safety risks and hearing or visual             impairment. 5. Diet and physical activities 6. Evidence for depression or mood disorders  The patients weight, height, BMI have been recorded in the chart and visual acuity is per eye clinic.  I have made referrals, counseling and provided education to the patient based review of the above and I have provided the pt with a written personalized care plan for preventive services.  Provider list updated- see scanned forms.  Routine anticipatory guidance given to patient.  See health maintenance.  Flu 2016 Shingles 2011 PNA 2016 Tetanus 2011 Colonoscopy NA due to age Prostate cancer screening and PSA options (with potential risks and benefits of testing vs not testing) were discussed along with recent recs/guidelines.  He declined testing PSA at this point. Advance directive- would have daughters designated if patient were incapacitated.   Cognitive function addressed- see scanned forms- and if abnormal then additional documentation follows.   Hypertension:    Using medication without problems or lightheadedness: yes Chest pain with exertion:no Edema:no Short of breath:no  OSA.  Compliant, using CPAP every night.  Not fatigued, benefits from use.  Used all night.    AF.  Compliant with anticoagulation, no bleeding.  No CP.    Gout.  No sx in months.  Compliant with meds.  No ADE on meds.  No pain currently.   PMH and SH reviewed  Meds, vitals, and allergies reviewed.   ROS: See HPI.   Otherwise negative.    GEN: nad, alert and oriented HEENT: mucous membranes moist NECK: supple w/o LA CV: IRR, not tachy PULM: ctab, no inc wob ABD: soft, +bs EXT: no edema SKIN: no acute rash

## 2014-12-18 DIAGNOSIS — E119 Type 2 diabetes mellitus without complications: Secondary | ICD-10-CM | POA: Insufficient documentation

## 2014-12-18 NOTE — Assessment & Plan Note (Signed)
Still elevated.  Had been eating doughnuts for breakfast.  dw pt about options and diet, weight.

## 2014-12-18 NOTE — Assessment & Plan Note (Signed)
Advance directive- would have daughters designated if patient were incapacitated.

## 2014-12-18 NOTE — Assessment & Plan Note (Signed)
Controlled, continue current meds.  Labs d/w pt.  Needs work on weight. D/w pt.

## 2014-12-18 NOTE — Assessment & Plan Note (Signed)
IRR today, continue coumadin.  Doing well overall.  Needs work on weight.  Rate controlled.  No change in meds.

## 2014-12-18 NOTE — Assessment & Plan Note (Signed)
Would continue meds as is, with no recent flares.  Uric acid up, d/w pt but wouldn't change dose in absence of sx.

## 2014-12-18 NOTE — Assessment & Plan Note (Signed)
Flu 2016 Shingles 2011 PNA 2016 Tetanus 2011 Colonoscopy NA due to age Prostate cancer screening and PSA options (with potential risks and benefits of testing vs not testing) were discussed along with recent recs/guidelines.  He declined testing PSA at this point. Advance directive- would have daughters designated if patient were incapacitated.   Cognitive function addressed- see scanned forms- and if abnormal then additional documentation follows.

## 2014-12-18 NOTE — Assessment & Plan Note (Signed)
Complaint, benefits from use.  See above.  Continue.  D/w pt about diet and weight.

## 2014-12-31 ENCOUNTER — Ambulatory Visit (INDEPENDENT_AMBULATORY_CARE_PROVIDER_SITE_OTHER): Payer: Medicare Other | Admitting: *Deleted

## 2014-12-31 DIAGNOSIS — I480 Paroxysmal atrial fibrillation: Secondary | ICD-10-CM

## 2014-12-31 DIAGNOSIS — I4891 Unspecified atrial fibrillation: Secondary | ICD-10-CM

## 2014-12-31 DIAGNOSIS — Z5181 Encounter for therapeutic drug level monitoring: Secondary | ICD-10-CM | POA: Diagnosis not present

## 2014-12-31 LAB — POCT INR: INR: 2.9

## 2015-02-06 ENCOUNTER — Other Ambulatory Visit: Payer: Self-pay | Admitting: Cardiovascular Disease

## 2015-02-11 ENCOUNTER — Ambulatory Visit (INDEPENDENT_AMBULATORY_CARE_PROVIDER_SITE_OTHER): Payer: Medicare Other | Admitting: *Deleted

## 2015-02-11 DIAGNOSIS — I4891 Unspecified atrial fibrillation: Secondary | ICD-10-CM

## 2015-02-11 DIAGNOSIS — I480 Paroxysmal atrial fibrillation: Secondary | ICD-10-CM

## 2015-02-11 DIAGNOSIS — Z5181 Encounter for therapeutic drug level monitoring: Secondary | ICD-10-CM

## 2015-02-11 LAB — POCT INR: INR: 2.5

## 2015-03-08 ENCOUNTER — Ambulatory Visit: Payer: Medicare Other | Admitting: Internal Medicine

## 2015-03-14 ENCOUNTER — Encounter: Payer: Self-pay | Admitting: *Deleted

## 2015-03-15 DIAGNOSIS — A740 Chlamydial conjunctivitis: Secondary | ICD-10-CM | POA: Diagnosis not present

## 2015-03-28 ENCOUNTER — Ambulatory Visit (INDEPENDENT_AMBULATORY_CARE_PROVIDER_SITE_OTHER): Payer: Medicare Other | Admitting: *Deleted

## 2015-03-28 DIAGNOSIS — Z5181 Encounter for therapeutic drug level monitoring: Secondary | ICD-10-CM | POA: Diagnosis not present

## 2015-03-28 DIAGNOSIS — I4891 Unspecified atrial fibrillation: Secondary | ICD-10-CM | POA: Diagnosis not present

## 2015-03-28 DIAGNOSIS — I480 Paroxysmal atrial fibrillation: Secondary | ICD-10-CM | POA: Diagnosis not present

## 2015-03-28 LAB — POCT INR: INR: 3.2

## 2015-04-03 ENCOUNTER — Other Ambulatory Visit: Payer: Self-pay | Admitting: Cardiovascular Disease

## 2015-04-10 DIAGNOSIS — H02825 Cysts of left lower eyelid: Secondary | ICD-10-CM | POA: Diagnosis not present

## 2015-04-25 ENCOUNTER — Ambulatory Visit (INDEPENDENT_AMBULATORY_CARE_PROVIDER_SITE_OTHER): Payer: Medicare Other | Admitting: *Deleted

## 2015-04-25 DIAGNOSIS — I480 Paroxysmal atrial fibrillation: Secondary | ICD-10-CM | POA: Diagnosis not present

## 2015-04-25 DIAGNOSIS — I4891 Unspecified atrial fibrillation: Secondary | ICD-10-CM

## 2015-04-25 DIAGNOSIS — Z5181 Encounter for therapeutic drug level monitoring: Secondary | ICD-10-CM

## 2015-04-25 LAB — POCT INR: INR: 2.6

## 2015-05-08 DIAGNOSIS — H02831 Dermatochalasis of right upper eyelid: Secondary | ICD-10-CM | POA: Diagnosis not present

## 2015-05-08 DIAGNOSIS — H02834 Dermatochalasis of left upper eyelid: Secondary | ICD-10-CM | POA: Diagnosis not present

## 2015-05-23 ENCOUNTER — Ambulatory Visit (INDEPENDENT_AMBULATORY_CARE_PROVIDER_SITE_OTHER): Payer: Medicare Other

## 2015-05-23 DIAGNOSIS — J310 Chronic rhinitis: Secondary | ICD-10-CM | POA: Diagnosis not present

## 2015-05-23 DIAGNOSIS — I480 Paroxysmal atrial fibrillation: Secondary | ICD-10-CM

## 2015-05-23 DIAGNOSIS — Z5181 Encounter for therapeutic drug level monitoring: Secondary | ICD-10-CM

## 2015-05-23 DIAGNOSIS — I4891 Unspecified atrial fibrillation: Secondary | ICD-10-CM | POA: Diagnosis not present

## 2015-05-23 LAB — POCT INR: INR: 2.4

## 2015-05-24 ENCOUNTER — Encounter: Payer: Self-pay | Admitting: Family Medicine

## 2015-05-24 ENCOUNTER — Ambulatory Visit (INDEPENDENT_AMBULATORY_CARE_PROVIDER_SITE_OTHER): Payer: Medicare Other | Admitting: Family Medicine

## 2015-05-24 VITALS — BP 142/84 | HR 79 | Temp 98.5°F | Wt 217.0 lb

## 2015-05-24 DIAGNOSIS — M546 Pain in thoracic spine: Secondary | ICD-10-CM | POA: Diagnosis not present

## 2015-05-24 DIAGNOSIS — M778 Other enthesopathies, not elsewhere classified: Secondary | ICD-10-CM | POA: Diagnosis not present

## 2015-05-24 DIAGNOSIS — M779 Enthesopathy, unspecified: Secondary | ICD-10-CM

## 2015-05-24 DIAGNOSIS — M549 Dorsalgia, unspecified: Secondary | ICD-10-CM | POA: Insufficient documentation

## 2015-05-24 DIAGNOSIS — H6982 Other specified disorders of Eustachian tube, left ear: Secondary | ICD-10-CM

## 2015-05-24 DIAGNOSIS — H6980 Other specified disorders of Eustachian tube, unspecified ear: Secondary | ICD-10-CM | POA: Insufficient documentation

## 2015-05-24 MED ORDER — ALLOPURINOL 300 MG PO TABS: 300.0000 mg | ORAL_TABLET | Freq: Every day | ORAL | Status: DC

## 2015-05-24 NOTE — Patient Instructions (Addendum)
Call the coumadin clinic about the antibiotics.  See if you need to be seen earlier.   Keep trying to pop your ears.   Keep using the flonase and zithromax.    Use ice on your left elbow (5 minutes on and 5 minutes off) and heat on the right side of your back.  You likely have either bursitis and/or tendonitis on your left triceps and a pulled/spasming muscle in your back.   Take care.  Glad to see you.

## 2015-05-24 NOTE — Assessment & Plan Note (Signed)
Continue abx, flonase, valsalva.  D/w pt.  See avs re: call to coumadin clinic re: abx. D/w pt.

## 2015-05-24 NOTE — Assessment & Plan Note (Signed)
Likely, ice and tylenol, d/w pt.  F/u prn.

## 2015-05-24 NOTE — Assessment & Plan Note (Signed)
Likely muscle strain, heat and tylenol, f/u prn.  D/w pt.  He agrees.

## 2015-05-24 NOTE — Progress Notes (Signed)
Pre visit review using our clinic review tool, if applicable. No additional management support is needed unless otherwise documented below in the visit note.  Sx started a few weeks ago.  Post nasal gtt, coughing sputum up in the AM.  Clear sputum.  He has seen ENT recently, yesterday.  L ear pain for the last month.  He notes some soreness in the L ear with swallowing.  Started on zmax yesterday per ENT.  Still on CPAP at baseline.    He has had some occ L distal upper arm pain, with pain on triceps movement. The arm pain is much better today.  No clear cause.  He doesn't typically have gout in the elbows.   He also had some R upper back pain.   His back isn't much better.    Meds, vitals, and allergies reviewed.   ROS: See HPI.  Otherwise, noncontributory.  nad ncat TM w/o erythema B.  He can get a small amount of movement of L TM with valsalva today.  Nasal exam stuffy OP wnl Neck supple, no LA ctab Back w/o pain in the midline.  R mid thoracic back ttp, R of midline, with twisting to the L No CVA pain Normal ROM L shoulder but pain on resisted triceps use, ie resisted extension, at distal triceps.  Not ttp at [facility] o/w.  No swelling or rash.  No bruising.

## 2015-06-20 ENCOUNTER — Ambulatory Visit (INDEPENDENT_AMBULATORY_CARE_PROVIDER_SITE_OTHER): Payer: Medicare Other

## 2015-06-20 DIAGNOSIS — Z5181 Encounter for therapeutic drug level monitoring: Secondary | ICD-10-CM | POA: Diagnosis not present

## 2015-06-20 DIAGNOSIS — I4891 Unspecified atrial fibrillation: Secondary | ICD-10-CM | POA: Diagnosis not present

## 2015-06-20 DIAGNOSIS — I480 Paroxysmal atrial fibrillation: Secondary | ICD-10-CM | POA: Diagnosis not present

## 2015-06-20 LAB — POCT INR: INR: 2.9

## 2015-07-15 ENCOUNTER — Other Ambulatory Visit: Payer: Self-pay | Admitting: Family Medicine

## 2015-07-15 NOTE — Telephone Encounter (Signed)
Sent. Thanks.   

## 2015-07-15 NOTE — Telephone Encounter (Signed)
Electronic refill request. Last Filled:     60 tablet 1 08/31/2014  Please advise.

## 2015-07-19 ENCOUNTER — Telehealth: Payer: Self-pay

## 2015-07-19 MED ORDER — COLCHICINE 0.6 MG PO TABS: 0.6000 mg | ORAL_TABLET | Freq: Two times a day (BID) | ORAL | Status: DC | PRN

## 2015-07-19 NOTE — Telephone Encounter (Signed)
Changed to colcrys, okay to use prn.  Sent.  Thanks.

## 2015-07-19 NOTE — Telephone Encounter (Signed)
Tabitha (DPR signed) said the [facility] no longer cover colchicine; cost to pt is over $300.00. Tabitha spoke with ins co and Colcrys is approved. Lawerance Bach wants to verify that Colcrys is compatible with pts other medications.Colcrys cost to pt is $42.00. [facility]. Request cb to pt.

## 2015-07-19 NOTE — Telephone Encounter (Signed)
Patient advised.

## 2015-07-23 DIAGNOSIS — M1A0711 Idiopathic chronic gout, right ankle and foot, with tophus (tophi): Secondary | ICD-10-CM | POA: Diagnosis not present

## 2015-07-23 DIAGNOSIS — M79674 Pain in right toe(s): Secondary | ICD-10-CM | POA: Diagnosis not present

## 2015-07-23 DIAGNOSIS — M79675 Pain in left toe(s): Secondary | ICD-10-CM | POA: Diagnosis not present

## 2015-07-23 DIAGNOSIS — M2012 Hallux valgus (acquired), left foot: Secondary | ICD-10-CM | POA: Diagnosis not present

## 2015-07-24 DIAGNOSIS — Z01818 Encounter for other preprocedural examination: Secondary | ICD-10-CM | POA: Diagnosis not present

## 2015-07-29 DIAGNOSIS — M2041 Other hammer toe(s) (acquired), right foot: Secondary | ICD-10-CM | POA: Diagnosis not present

## 2015-07-29 DIAGNOSIS — M79674 Pain in right toe(s): Secondary | ICD-10-CM | POA: Diagnosis not present

## 2015-08-01 ENCOUNTER — Ambulatory Visit (INDEPENDENT_AMBULATORY_CARE_PROVIDER_SITE_OTHER): Payer: Medicare Other | Admitting: *Deleted

## 2015-08-01 DIAGNOSIS — I480 Paroxysmal atrial fibrillation: Secondary | ICD-10-CM | POA: Diagnosis not present

## 2015-08-01 DIAGNOSIS — I4891 Unspecified atrial fibrillation: Secondary | ICD-10-CM | POA: Diagnosis not present

## 2015-08-01 DIAGNOSIS — Z5181 Encounter for therapeutic drug level monitoring: Secondary | ICD-10-CM

## 2015-08-01 LAB — POCT INR: INR: 3.2

## 2015-08-03 ENCOUNTER — Other Ambulatory Visit: Payer: Self-pay | Admitting: Cardiovascular Disease

## 2015-08-05 DIAGNOSIS — M2041 Other hammer toe(s) (acquired), right foot: Secondary | ICD-10-CM | POA: Diagnosis not present

## 2015-08-05 DIAGNOSIS — M1A9XX1 Chronic gout, unspecified, with tophus (tophi): Secondary | ICD-10-CM | POA: Diagnosis not present

## 2015-08-09 DIAGNOSIS — M2041 Other hammer toe(s) (acquired), right foot: Secondary | ICD-10-CM | POA: Diagnosis not present

## 2015-08-26 ENCOUNTER — Ambulatory Visit (INDEPENDENT_AMBULATORY_CARE_PROVIDER_SITE_OTHER): Payer: Medicare Other | Admitting: *Deleted

## 2015-08-26 DIAGNOSIS — I480 Paroxysmal atrial fibrillation: Secondary | ICD-10-CM

## 2015-08-26 DIAGNOSIS — Z5181 Encounter for therapeutic drug level monitoring: Secondary | ICD-10-CM | POA: Diagnosis not present

## 2015-08-26 DIAGNOSIS — I4891 Unspecified atrial fibrillation: Secondary | ICD-10-CM

## 2015-08-26 LAB — POCT INR: INR: 2.9

## 2015-09-02 DIAGNOSIS — M2041 Other hammer toe(s) (acquired), right foot: Secondary | ICD-10-CM | POA: Diagnosis not present

## 2015-09-02 DIAGNOSIS — L602 Onychogryphosis: Secondary | ICD-10-CM | POA: Diagnosis not present

## 2015-09-02 DIAGNOSIS — M25572 Pain in left ankle and joints of left foot: Secondary | ICD-10-CM | POA: Diagnosis not present

## 2015-09-16 ENCOUNTER — Ambulatory Visit (INDEPENDENT_AMBULATORY_CARE_PROVIDER_SITE_OTHER): Payer: Medicare Other | Admitting: *Deleted

## 2015-09-16 DIAGNOSIS — Z5181 Encounter for therapeutic drug level monitoring: Secondary | ICD-10-CM | POA: Diagnosis not present

## 2015-09-16 DIAGNOSIS — I480 Paroxysmal atrial fibrillation: Secondary | ICD-10-CM

## 2015-09-16 DIAGNOSIS — I4891 Unspecified atrial fibrillation: Secondary | ICD-10-CM | POA: Diagnosis not present

## 2015-09-16 LAB — POCT INR: INR: 4.1

## 2015-09-19 ENCOUNTER — Telehealth: Payer: Self-pay | Admitting: Cardiovascular Disease

## 2015-09-19 NOTE — Telephone Encounter (Signed)
New message     Patient wants to be seen in July per recalls. Does not want to see APP.

## 2015-09-20 NOTE — Telephone Encounter (Signed)
I spoke with the pt and made him aware that I have placed him on Dr Antionette Char wait list for an appointment.

## 2015-09-30 ENCOUNTER — Other Ambulatory Visit: Payer: Self-pay

## 2015-09-30 ENCOUNTER — Ambulatory Visit (INDEPENDENT_AMBULATORY_CARE_PROVIDER_SITE_OTHER): Payer: Medicare Other | Admitting: *Deleted

## 2015-09-30 DIAGNOSIS — Z5181 Encounter for therapeutic drug level monitoring: Secondary | ICD-10-CM

## 2015-09-30 DIAGNOSIS — R1310 Dysphagia, unspecified: Secondary | ICD-10-CM

## 2015-09-30 DIAGNOSIS — I4891 Unspecified atrial fibrillation: Secondary | ICD-10-CM | POA: Diagnosis not present

## 2015-09-30 DIAGNOSIS — I480 Paroxysmal atrial fibrillation: Secondary | ICD-10-CM

## 2015-09-30 DIAGNOSIS — K222 Esophageal obstruction: Secondary | ICD-10-CM

## 2015-09-30 DIAGNOSIS — R1319 Other dysphagia: Secondary | ICD-10-CM

## 2015-09-30 LAB — POCT INR: INR: 2.6

## 2015-09-30 MED ORDER — OMEPRAZOLE 40 MG PO CPDR: 40.0000 mg | DELAYED_RELEASE_CAPSULE | Freq: Every day | ORAL | Status: DC

## 2015-10-09 DIAGNOSIS — L821 Other seborrheic keratosis: Secondary | ICD-10-CM | POA: Diagnosis not present

## 2015-10-09 DIAGNOSIS — L578 Other skin changes due to chronic exposure to nonionizing radiation: Secondary | ICD-10-CM | POA: Diagnosis not present

## 2015-10-09 DIAGNOSIS — L812 Freckles: Secondary | ICD-10-CM | POA: Diagnosis not present

## 2015-10-09 DIAGNOSIS — D1801 Hemangioma of skin and subcutaneous tissue: Secondary | ICD-10-CM | POA: Diagnosis not present

## 2015-10-09 DIAGNOSIS — L570 Actinic keratosis: Secondary | ICD-10-CM | POA: Diagnosis not present

## 2015-10-09 DIAGNOSIS — D235 Other benign neoplasm of skin of trunk: Secondary | ICD-10-CM | POA: Diagnosis not present

## 2015-10-17 ENCOUNTER — Ambulatory Visit (INDEPENDENT_AMBULATORY_CARE_PROVIDER_SITE_OTHER): Payer: Medicare Other | Admitting: Cardiovascular Disease

## 2015-10-17 ENCOUNTER — Encounter: Payer: Self-pay | Admitting: Cardiovascular Disease

## 2015-10-17 ENCOUNTER — Telehealth: Payer: Self-pay

## 2015-10-17 VITALS — BP 148/70 | HR 89 | Ht 66.0 in | Wt 208.4 lb

## 2015-10-17 DIAGNOSIS — I4891 Unspecified atrial fibrillation: Secondary | ICD-10-CM | POA: Diagnosis not present

## 2015-10-17 MED ORDER — DILTIAZEM HCL ER COATED BEADS 240 MG PO CP24: 240.0000 mg | ORAL_CAPSULE | Freq: Every day | ORAL | 11 refills | Status: DC

## 2015-10-17 MED ORDER — COLCHICINE 0.6 MG PO TABS: 0.6000 mg | ORAL_TABLET | Freq: Every day | ORAL | 12 refills | Status: DC | PRN

## 2015-10-17 NOTE — Telephone Encounter (Signed)
[name] at [place] left v/m; since cardiology increased Diltiazem dosage that prompted a possible drug interaction between Diltiazem and Colchicine recommending Colchicine be decreased to taking once daily. [name] request cb.

## 2015-10-17 NOTE — Telephone Encounter (Signed)
Agreed.  Sig changed.  Thanks.  Notify pt.   rx resent.

## 2015-10-17 NOTE — Patient Instructions (Addendum)
Medication Instructions:  Your physician has recommended you make the following change in your medication:  1. INCREASE Diltiazem CD to 240mg  take one capsule by mouth daily  Labwork: No new orders.   Testing/Procedures: Your physician has requested that you have an echocardiogram. Echocardiography is a painless test that uses sound waves to create images of your heart. It provides your doctor with information about the size and shape of your heart and how well your heart's chambers and valves are working. This procedure takes approximately one hour. There are no restrictions for this procedure.  Follow-Up: Your physician wants you to follow-up in: 1 YEAR with Dr Burt Knack.  You will receive a reminder letter in the mail two months in advance. If you don't receive a letter, please call our office to schedule the follow-up appointment.   Any Other Special Instructions Will Be Listed Below (If Applicable).     If you need a refill on your cardiac medications before your next appointment, please call your pharmacy.

## 2015-10-17 NOTE — Progress Notes (Signed)
Cardiology Office Note Date:  10/17/2015   ID:  [name], DOB Mar 26, 1930, MRN [id number]  PCP:  Elsie Stain, MD  Cardiologist:  Sherren Mocha, MD    Chief Complaint  Patient presents with  . Follow-up    Atrial fibrillation     History of Present Illness: [name] is a 82 y.o. male who presents for  followup of paroxysmal atrial fibrillation, HTN, and nonobstructive CAD. The patient has been maintained on chronic anticoagulation with warfarin. He underwent cardiac catheterization in 2010 demonstrating mild nonobstructive CAD primarily affecting the left circumflex with less than 50% stenosis in that vessel. His LV EF has been preserved.   The patient is doing fairly well. He continues to do physical work. States that he can't work like he could 10 years ago but still able to do his work every day. Complains of "giving out" with activity on some days. Denies shortness of breath, chest pain, or heart palpitations. He has not been able to feel atrial fibrillation. He brings in home blood pressure and pulse readings today. Readings over the last 6 months demonstrate blood pressures ranging from 1 2150s over 70s N/A. Heart rate is running in the 80s and 90s.  Past Medical History:  Diagnosis Date  . Atrial fibrillation ([place])   . Coronary artery disease   . Diverticulosis   . Esophageal reflux   . Esophageal stricture from GERD 06/08/2011   With stricture at GE junction on EGD, dilated 06/2011   . Gastric ulcer    on EGD 2013  . Gout, unspecified   . Hyperlipidemia   . Hypertension   . Obesity, unspecified   . Orthopnea   . OSA (obstructive sleep apnea)    CPAP at night  . Paroxysmal nocturnal dyspnea   . Vertigo     Past Surgical History:  Procedure Laterality Date  . BALLOON DILATION N/A 10/22/2014   Procedure: BALLOON DILATION;  Surgeon: [name], MD;  Location: [facility];  Service: Endoscopy;  Laterality: N/A;  . Carotid Dopplers  09/2009   no sig  extracranial stenosis and vertebral arteries had antegrade flow  . CATARACT EXTRACTION    . ESOPHAGOGASTRODUODENOSCOPY    . ESOPHAGOGASTRODUODENOSCOPY (EGD) WITH PROPOFOL N/A 10/22/2014   Procedure: ESOPHAGOGASTRODUODENOSCOPY (EGD) WITH PROPOFOL;  Surgeon: [name], MD;  Location: [facility];  Service: Endoscopy;  Laterality: N/A;  . MRI of brain  09/2009   chronic microvascular ischemia  . OSA Sleep Study     per Dr. Brett Fairy  . ROTATOR CUFF REPAIR  2008   right    Current Outpatient Prescriptions  Medication Sig Dispense Refill  . allopurinol (ZYLOPRIM) 300 MG tablet Take 1 tablet (300 mg total) by mouth daily.    . colchicine 0.6 MG tablet Take 1 tablet (0.6 mg total) by mouth 2 (two) times daily as needed (for gout). Fill with colcrys 60 tablet 12  . diltiazem (CARDIZEM CD) 240 MG 24 hr capsule Take 1 capsule (240 mg total) by mouth daily. 30 capsule 11  . fluocinonide cream (LIDEX) [id number] % Apply 1 application topically 2 (two) times daily.  2  . fluticasone (FLONASE) 50 MCG/ACT nasal spray Place 1 spray into both nostrils daily.    [name] lisinopril (PRINIVIL,ZESTRIL) 40 MG tablet TAKE 1 TABLET EVERY DAY 30 tablet 9  . metoprolol tartrate (LOPRESSOR) 25 MG tablet TAKE 1/2 TABLET BY MOUTH TWICE A DAY 30 tablet 9  . omeprazole (PRILOSEC) 40 MG capsule Take 1  capsule (40 mg total) by mouth daily. 30 capsule 5  . warfarin (COUMADIN) 4 MG tablet TAKE AS DIRECTED BY COUMADIN CLINIC 40 tablet 3   No current facility-administered medications for this visit.     Allergies:   Penicillins and Doxycycline   Social History:  The patient  reports that he quit smoking about 46 years ago. His smoking use included Cigarettes. He quit after 4.00 years of use. He has never used smokeless tobacco. He reports that he does not drink alcohol or use drugs.   Family History:  The patient's family history includes Dementia in his father; Heart disease in his brother; Hypertension in his father and other;  Pneumonia in his father; Stomach cancer in his mother.    ROS:  Please see the history of present illness.  Otherwise, review of systems is positive for Easy bruising, snoring.  All other systems are reviewed and negative.    PHYSICAL EXAM: VS:  BP (!) 148/70 (BP Location: Left Arm, Patient Position: Sitting, Cuff Size: Normal)   Pulse 89   Ht 5\' 6"  (1.676 m)   Wt 208 lb 6.4 oz (94.5 kg)   BMI 33.64 kg/m  , BMI Body mass index is 33.64 kg/m. GEN: Well nourished, well developed, obese male in no acute distress  HEENT: normal  Neck: no JVD, no masses. No carotid bruits Cardiac: irregularly irregular without murmur or gallop                Respiratory:  clear to auscultation bilaterally, normal work of breathing GI: soft, nontender, nondistended, + BS, obese MS: no deformity or atrophy  Ext: no pretibial edema, pedal pulses 2+= bilaterally Skin: warm and dry, no rash Neuro:  Strength and sensation are intact Psych: euthymic mood, full affect  EKG:  EKG is ordered today. The ekg ordered today shows atrial fibrillation with controlled ventricular rate 89 bpm, otherwise within normal limits.  Recent Labs: 12/06/2014: ALT 21; BUN 25; Creatinine, Ser 1.35; Hemoglobin 14.7; Platelets 159.0; Potassium 4.5; Sodium 137; TSH 1.07   Lipid Panel     Component Value Date/Time   CHOL 160 12/06/2014 0832   TRIG 268.0 (H) 12/06/2014 0832   HDL 28.30 (L) 12/06/2014 0832   CHOLHDL 6 12/06/2014 0832   VLDL 53.6 (H) 12/06/2014 0832   LDLCALC 94 08/22/2013 1319   LDLDIRECT 92.0 12/06/2014 0832      Wt Readings from Last 3 Encounters:  10/17/15 208 lb 6.4 oz (94.5 kg)  05/24/15 217 lb (98.4 kg)  12/17/14 209 lb (94.8 kg)     ASSESSMENT AND PLAN: 1.  Paroxysmal atrial fibrillation: Now with probably persistent atrial fibrillation based on his home pulse readings. He is not aware of his heart rhythm. He continues on chronic oral anticoagulation with warfarin. Will increase diltiazem to 240 mg  daily for better heart rate control as his resting heart rates are slightly elevated.  2. Nonobstructive CAD: no angina. On warfarin so no indication for antiplatelet Rx.  3. Hypertension: home BP's reviewed and are mildly elevated. Cardizem will be increased.  4. Long-term use of anticoagulant drug: no bleeding problems reported. He is compliant with warfarin monitoring.  Current medicines are reviewed with the patient today.  The patient does not have concerns regarding medicines.  Labs/ tests ordered today include:   Orders Placed This Encounter  Procedures  . EKG 12-Lead  . ECHOCARDIOGRAM COMPLETE    Disposition:   FU one year  Signed, Sherren Mocha, MD  10/17/2015 2:07  PM    [facility] [address], [place], [place]  [address] Phone: [phone]; Fax: [phone]

## 2015-10-18 NOTE — Telephone Encounter (Signed)
Daughter advised.

## 2015-10-21 ENCOUNTER — Emergency Department (HOSPITAL_COMMUNITY): Payer: Medicare Other

## 2015-10-21 ENCOUNTER — Encounter (HOSPITAL_COMMUNITY): Payer: Self-pay

## 2015-10-21 ENCOUNTER — Inpatient Hospital Stay (HOSPITAL_COMMUNITY)
Admission: EM | Admit: 2015-10-21 | Discharge: 2015-10-25 | DRG: 247 | Disposition: A | Discharge status: Home / Self Care | Payer: Medicare Other | Attending: Internal Medicine | Admitting: Internal Medicine

## 2015-10-21 DIAGNOSIS — I10 Essential (primary) hypertension: Secondary | ICD-10-CM | POA: Diagnosis not present

## 2015-10-21 DIAGNOSIS — R079 Chest pain, unspecified: Secondary | ICD-10-CM | POA: Diagnosis not present

## 2015-10-21 DIAGNOSIS — E785 Hyperlipidemia, unspecified: Secondary | ICD-10-CM | POA: Diagnosis present

## 2015-10-21 DIAGNOSIS — Z87891 Personal history of nicotine dependence: Secondary | ICD-10-CM

## 2015-10-21 DIAGNOSIS — G4733 Obstructive sleep apnea (adult) (pediatric): Secondary | ICD-10-CM | POA: Diagnosis present

## 2015-10-21 DIAGNOSIS — N183 Chronic kidney disease, stage 3 unspecified: Secondary | ICD-10-CM | POA: Diagnosis present

## 2015-10-21 DIAGNOSIS — I480 Paroxysmal atrial fibrillation: Secondary | ICD-10-CM

## 2015-10-21 DIAGNOSIS — I129 Hypertensive chronic kidney disease with stage 1 through stage 4 chronic kidney disease, or unspecified chronic kidney disease: Secondary | ICD-10-CM | POA: Diagnosis not present

## 2015-10-21 DIAGNOSIS — I200 Unstable angina: Secondary | ICD-10-CM

## 2015-10-21 DIAGNOSIS — R0789 Other chest pain: Secondary | ICD-10-CM | POA: Diagnosis not present

## 2015-10-21 DIAGNOSIS — Z6834 Body mass index (BMI) 34.0-34.9, adult: Secondary | ICD-10-CM

## 2015-10-21 DIAGNOSIS — M109 Gout, unspecified: Secondary | ICD-10-CM | POA: Diagnosis present

## 2015-10-21 DIAGNOSIS — E669 Obesity, unspecified: Secondary | ICD-10-CM | POA: Diagnosis not present

## 2015-10-21 DIAGNOSIS — I25110 Atherosclerotic heart disease of native coronary artery with unstable angina pectoris: Secondary | ICD-10-CM | POA: Diagnosis not present

## 2015-10-21 DIAGNOSIS — Z7901 Long term (current) use of anticoagulants: Secondary | ICD-10-CM

## 2015-10-21 DIAGNOSIS — R0602 Shortness of breath: Secondary | ICD-10-CM | POA: Diagnosis not present

## 2015-10-21 DIAGNOSIS — I2510 Atherosclerotic heart disease of native coronary artery without angina pectoris: Secondary | ICD-10-CM | POA: Diagnosis present

## 2015-10-21 DIAGNOSIS — Z7951 Long term (current) use of inhaled steroids: Secondary | ICD-10-CM

## 2015-10-21 DIAGNOSIS — I481 Persistent atrial fibrillation: Secondary | ICD-10-CM | POA: Diagnosis not present

## 2015-10-21 DIAGNOSIS — I482 Chronic atrial fibrillation: Secondary | ICD-10-CM | POA: Diagnosis present

## 2015-10-21 DIAGNOSIS — E781 Pure hyperglyceridemia: Secondary | ICD-10-CM | POA: Diagnosis present

## 2015-10-21 DIAGNOSIS — R7302 Impaired glucose tolerance (oral): Secondary | ICD-10-CM | POA: Diagnosis present

## 2015-10-21 DIAGNOSIS — N289 Disorder of kidney and ureter, unspecified: Secondary | ICD-10-CM | POA: Diagnosis present

## 2015-10-21 DIAGNOSIS — Z9989 Dependence on other enabling machines and devices: Secondary | ICD-10-CM | POA: Diagnosis present

## 2015-10-21 DIAGNOSIS — E7800 Pure hypercholesterolemia, unspecified: Secondary | ICD-10-CM | POA: Diagnosis present

## 2015-10-21 DIAGNOSIS — I4819 Other persistent atrial fibrillation: Secondary | ICD-10-CM | POA: Diagnosis present

## 2015-10-21 DIAGNOSIS — I25118 Atherosclerotic heart disease of native coronary artery with other forms of angina pectoris: Secondary | ICD-10-CM | POA: Diagnosis present

## 2015-10-21 DIAGNOSIS — R001 Bradycardia, unspecified: Secondary | ICD-10-CM | POA: Diagnosis present

## 2015-10-21 HISTORY — DX: Obstructive sleep apnea (adult) (pediatric): G47.33

## 2015-10-21 HISTORY — DX: Bradycardia, unspecified: R00.1

## 2015-10-21 HISTORY — DX: Other persistent atrial fibrillation: I48.19

## 2015-10-21 HISTORY — DX: Chronic kidney disease, stage 3 (moderate): N18.3

## 2015-10-21 HISTORY — DX: Obstructive sleep apnea (adult) (pediatric): Z99.89

## 2015-10-21 HISTORY — DX: Chronic kidney disease, stage 3 unspecified: N18.30

## 2015-10-21 LAB — BASIC METABOLIC PANEL
ANION GAP: 9 (ref 5–15)
BUN: 15 mg/dL (ref 6–20)
CALCIUM: 9 mg/dL (ref 8.9–10.3)
CO2: 23 mmol/L (ref 22–32)
CREATININE: 1.28 mg/dL — AB (ref 0.61–1.24)
Chloride: 104 mmol/L (ref 101–111)
GFR calc Af Amer: 58 mL/min — ABNORMAL LOW (ref >60)
GFR calc non Af Amer: 50 mL/min — ABNORMAL LOW (ref >60)
GLUCOSE: 149 mg/dL — AB (ref 65–99)
Potassium: 4.6 mmol/L (ref 3.5–5.1)
Sodium: 136 mmol/L (ref 135–145)

## 2015-10-21 LAB — CBC
HCT: 46.5 % (ref 39.0–52.0)
HEMOGLOBIN: 15.8 g/dL (ref 13.0–17.0)
MCH: 30.1 pg (ref 26.0–34.0)
MCHC: 34 g/dL (ref 30.0–36.0)
MCV: 88.6 fL (ref 78.0–100.0)
PLATELETS: 166 10*3/uL (ref 150–400)
RBC: 5.25 MIL/uL (ref 4.22–5.81)
RDW: 14.6 % (ref 11.5–15.5)
WBC: 5.9 10*3/uL (ref 4.0–10.5)

## 2015-10-21 LAB — PROTIME-INR
INR: 3
PROTHROMBIN TIME: 31.8 s — AB (ref 11.4–15.2)

## 2015-10-21 LAB — I-STAT TROPONIN, ED: TROPONIN I, POC: 0 ng/mL (ref 0.00–0.08)

## 2015-10-21 LAB — TROPONIN I

## 2015-10-21 MED ORDER — ALLOPURINOL 300 MG PO TABS
300.0000 mg | ORAL_TABLET | Freq: Every day | ORAL | Status: DC
  Administered 2015-10-22 – 2015-10-25 (×4): 300 mg via ORAL
  Filled 2015-10-21: qty 3
  Filled 2015-10-21: qty 1
  Filled 2015-10-21 (×2): qty 3

## 2015-10-21 MED ORDER — PANTOPRAZOLE SODIUM 40 MG PO TBEC
80.0000 mg | DELAYED_RELEASE_TABLET | Freq: Every day | ORAL | Status: DC
  Administered 2015-10-22 – 2015-10-25 (×3): 80 mg via ORAL
  Filled 2015-10-21 (×4): qty 2

## 2015-10-21 MED ORDER — WARFARIN - PHARMACIST DOSING INPATIENT: Freq: Every day | Status: DC

## 2015-10-21 MED ORDER — COLCHICINE 0.6 MG PO TABS: 0.6000 mg | ORAL_TABLET | Freq: Every day | ORAL | Status: DC | PRN

## 2015-10-21 MED ORDER — METOPROLOL TARTRATE 12.5 MG HALF TABLET
12.5000 mg | ORAL_TABLET | Freq: Two times a day (BID) | ORAL | Status: DC
  Administered 2015-10-21 – 2015-10-25 (×7): 12.5 mg via ORAL
  Filled 2015-10-21 (×8): qty 1

## 2015-10-21 MED ORDER — ACETAMINOPHEN 325 MG PO TABS: 650.0000 mg | ORAL_TABLET | ORAL | Status: DC | PRN

## 2015-10-21 MED ORDER — ONDANSETRON HCL 4 MG/2ML IJ SOLN: 4.0000 mg | Freq: Four times a day (QID) | INTRAMUSCULAR | Status: DC | PRN

## 2015-10-21 MED ORDER — LISINOPRIL 40 MG PO TABS
40.0000 mg | ORAL_TABLET | Freq: Every day | ORAL | Status: DC
  Administered 2015-10-22 – 2015-10-23 (×2): 40 mg via ORAL
  Filled 2015-10-21 (×2): qty 1

## 2015-10-21 MED ORDER — DILTIAZEM HCL ER COATED BEADS 240 MG PO CP24
240.0000 mg | ORAL_CAPSULE | Freq: Every day | ORAL | Status: DC
  Administered 2015-10-23 – 2015-10-25 (×3): 240 mg via ORAL
  Filled 2015-10-21 (×4): qty 1

## 2015-10-21 MED ORDER — FLUTICASONE PROPIONATE 50 MCG/ACT NA SUSP: 1.0000 | Freq: Every day | NASAL | Status: DC | PRN

## 2015-10-21 NOTE — Progress Notes (Signed)
ANTICOAGULATION CONSULT NOTE - Initial Consult  Pharmacy Consult for Coumadin Indication: atrial fibrillation  Allergies  Allergen Reactions  . Penicillins Shortness Of Breath and Swelling    Has patient had a PCN reaction causing immediate rash, facial/tongue/throat swelling, SOB or lightheadedness with hypotensio: YES Has patient had a PCN reaction causing severe rash involving mucus membranes or skin necrosis: NO Has patient had a PCN reaction that required hospitalization NO Has patient had a PCN reaction occurring within the last 10 years: NO If all of the above answers are "NO", then may proceed with Cephalosporin use.  [name] Doxycycline     GI upset    Patient Measurements: Height: 5\' 6"  (167.6 cm) Weight: 205 lb (93 kg) IBW/kg (Calculated) : 63.8  Vital Signs: Temp: 98 F (36.7 C) (08/07 1933) Temp Source: Oral (08/07 1933) BP: 147/106 (08/07 2116) Pulse Rate: 73 (08/07 1933)  Labs:  Recent Labs  10/21/15 1809 10/21/15 2128  HGB 15.8  --   HCT 46.5  --   PLT 166  --   LABPROT  --  31.8*  INR  --  3.00  CREATININE 1.28*  --     Estimated Creatinine Clearance: 47.5 mL/min (by C-G formula based on SCr of 1.28 mg/dL).   Medical History: Past Medical History:  Diagnosis Date  . Atrial fibrillation ([place])   . Coronary artery disease   . Diverticulosis   . Esophageal reflux   . Esophageal stricture from GERD 06/08/2011   With stricture at GE junction on EGD, dilated 06/2011   . Gastric ulcer    on EGD 2013  . Gout, unspecified   . Hyperlipidemia   . Hypertension   . Obesity, unspecified   . Orthopnea   . OSA (obstructive sleep apnea)    CPAP at night  . Paroxysmal nocturnal dyspnea   . Vertigo      Assessment: 82 yo M presents on 8/7 with CP and SOB. Has a history of Afib on Coumadin 4mg  daily exc 2mg  on Mon, Wed, and Sun. Last dose was taken today on 8/7. INR on admit is therapeutic at 3.0. CBC stable, no s/s of bleed.   Goal of Therapy:  INR  2-3 Monitor platelets by anticoagulation protocol: Yes   Plan:  No dose needed tonight since last dose was earlier today Follow INR with am labs tomorrow for further dosing  Elenor Quinones, PharmD, [facility] Clinical Pharmacist Pager [phone] 10/21/2015 10:07 PM

## 2015-10-21 NOTE — ED Provider Notes (Signed)
Emergency Department Provider Note   I have reviewed the triage vital signs and the nursing notes.   HISTORY  Chief Complaint Chest Pain and Shortness of Breath   HPI [name] is a 82 y.o. male with PMH of CAD (last cath 2010), a-fib, HTN, HLD, obesity, presents to the emergency department for evaluation of intermittent chest discomfort with symptoms radiating down both arms. Patient states his symptoms began abruptly 3 days ago. They resolve spontaneously and do not appear to be associated with exertion. He denies similar pain in the past. He reports a heart cath done in 2010 which showed no significant blockage. Since that time he has not been reevaluated for chest pain. He does follow closely with his cardiologist, Dr. Burt Knack, for his A. Fib. He also notes worsening exertional dyspnea and intermittent sweats. No fevers or chills. No abdominal discomfort. No clear association with food.  Past Medical History:  Diagnosis Date  . Atrial fibrillation ([place])   . Coronary artery disease   . Diverticulosis   . Esophageal reflux   . Esophageal stricture from GERD 06/08/2011   With stricture at GE junction on EGD, dilated 06/2011   . Gastric ulcer    on EGD 2013  . Gout, unspecified   . Hyperlipidemia   . Hypertension   . Obesity, unspecified   . Orthopnea   . OSA (obstructive sleep apnea)    CPAP at night  . Paroxysmal nocturnal dyspnea   . Vertigo     Patient Active Problem List   Diagnosis Date Noted  . Back pain 05/24/2015  . Triceps tendonitis 05/24/2015  . ETD (eustachian tube dysfunction) 05/24/2015  . Hyperglycemia 12/18/2014  . Dysphagia, pharyngoesophageal phase 09/25/2014  . History of esophageal stricture 09/25/2014  . Chronic anticoagulation 09/25/2014  . Helicobacter pylori duodenitis 09/25/2014  . Throat clearing 03/14/2014  . Benign paroxysmal positional vertigo 03/14/2014  . Encounter for therapeutic drug monitoring 04/12/2013  . Medicare annual  wellness visit, subsequent 06/24/2012  . Esophageal stricture from GERD 06/08/2011  . Advance directive discussed with patient 04/20/2011  . Chest pain 02/16/2011  . OSA on CPAP 09/22/2010  . Atrial fibrillation ([place]) 10/29/2008  . HYPERCHOLESTEROLEMIA 08/15/2008  . Gout 08/15/2008  . OBESITY 08/15/2008  . Essential hypertension 08/15/2008  . CAD 08/15/2008  . GASTROESOPHAGEAL REFLUX DISEASE 08/15/2008  . ORTHOPNEA 08/15/2008  . PAROXYSMAL NOCTURNAL DYSPNEA 08/15/2008    Past Surgical History:  Procedure Laterality Date  . BALLOON DILATION N/A 10/22/2014   Procedure: BALLOON DILATION;  Surgeon: [name], MD;  Location: [facility];  Service: Endoscopy;  Laterality: N/A;  . Carotid Dopplers  09/2009   no sig extracranial stenosis and vertebral arteries had antegrade flow  . CATARACT EXTRACTION    . ESOPHAGOGASTRODUODENOSCOPY    . ESOPHAGOGASTRODUODENOSCOPY (EGD) WITH PROPOFOL N/A 10/22/2014   Procedure: ESOPHAGOGASTRODUODENOSCOPY (EGD) WITH PROPOFOL;  Surgeon: [name], MD;  Location: [facility];  Service: Endoscopy;  Laterality: N/A;  . MRI of brain  09/2009   chronic microvascular ischemia  . OSA Sleep Study     per Dr. Brett Fairy  . ROTATOR CUFF REPAIR  2008   right    Current Outpatient Rx  . Order #: [id number] Class: No Print  . Order #: [id number] Class: Normal  . Order #: [id number] Class: Normal  . Order #: [id number] Class: Historical Med  . Order #: [id number] Class: Historical Med  . Order #: [id number] Class: Normal  . Order #: [id number] Class: Normal  . Order #: [id number] Class: Normal  .  Order #: [id number] Class: Normal    Allergies Penicillins and Doxycycline  Family History  Problem Relation Age of Onset  . Stomach cancer Mother     died in 79's   . Hypertension Father     died in his 76's pna likely dementia  . Dementia Father   . Pneumonia Father   . Heart disease Brother     S/P CABG  . Hypertension Other   . Prostate cancer Neg Hx   . Colon  cancer Neg Hx     Social History Social History  Substance Use Topics  . Smoking status: Former Smoker    Years: 4.00    Types: Cigarettes    Quit date: 03/16/1969  . Smokeless tobacco: Never Used     Comment: 1 pack/week for about 4 years, quit 1971  . Alcohol use No     Comment: Quit all alcohol in 1970's    Review of Systems  Constitutional: No fever/chills Eyes: No visual changes. ENT: No sore throat. Cardiovascular: Positive chest pain. Respiratory: Positive shortness of breath. Gastrointestinal: No abdominal pain.  No nausea, no vomiting.  No diarrhea.  No constipation. Genitourinary: Negative for dysuria. Musculoskeletal: Negative for back pain. Skin: Negative for rash. Neurological: Negative for headaches, focal weakness or numbness.  10-point ROS otherwise negative.  ____________________________________________   PHYSICAL EXAM:  VITAL SIGNS: ED Triage Vitals  Enc Vitals Group     BP 10/21/15 1808 131/78     Pulse Rate 10/21/15 1808 75     Resp 10/21/15 1808 18     Temp 10/21/15 1808 98.6 F (37 C)     Temp Source 10/21/15 1808 Oral     SpO2 10/21/15 1808 98 %     Weight 10/21/15 1808 205 lb (93 kg)     Height 10/21/15 1808 5\' 6"  (1.676 m)     Pain Score 10/21/15 1805 5   Constitutional: Alert and oriented. Well appearing and in no acute distress. Eyes: Conjunctivae are normal. PERRL. EOMI. Head: Atraumatic. Nose: No congestion/rhinnorhea. Mouth/Throat: Mucous membranes are moist.  Oropharynx non-erythematous. Neck: No stridor.   Cardiovascular: Normal rate, regular rhythm. Good peripheral circulation. Grossly normal heart sounds.   Respiratory: Normal respiratory effort.  No retractions. Lungs CTAB. Gastrointestinal: Soft and nontender. Protuberant abdomen.  Musculoskeletal: No lower extremity tenderness nor edema. No gross deformities of extremities. Neurologic:  Normal speech and language. No gross focal neurologic deficits are appreciated.  Skin:   Skin is warm, dry and intact. No rash noted. Psychiatric: Mood and affect are normal. Speech and behavior are normal.  ____________________________________________   LABS (all labs ordered are listed, but only abnormal results are displayed)  Labs Reviewed  BASIC METABOLIC PANEL - Abnormal; Notable for the following:       Result Value   Glucose, Bld 149 (*)    Creatinine, Ser 1.28 (*)    GFR calc non Af Amer 50 (*)    GFR calc Af Amer 58 (*)    All other components within normal limits  PROTIME-INR - Abnormal; Notable for the following:    Prothrombin Time 31.8 (*)    All other components within normal limits  PROTIME-INR - Abnormal; Notable for the following:    Prothrombin Time 32.8 (*)    All other components within normal limits  CBC  CBC  TROPONIN I  TROPONIN I  TROPONIN I  I-STAT TROPOININ, ED   ____________________________________________  EKG  Reviewed in MUSE. No STEMI.  ____________________________________________  RADIOLOGY  Dg Chest 2 View  Result Date: 10/21/2015 CLINICAL DATA:  Chest pain and shortness of breath since Friday. Pain radiates through bilateral arms. EXAM: CHEST  2 VIEW COMPARISON:  09/22/2009 FINDINGS: Heart size is normal. Small calcified pulmonary nodules are consistent with prior granulomatous disease. Calcifications are identified within the mediastinal lymph nodes. There are no focal consolidations or pleural effusions. IMPRESSION: No evidence for acute Prior granulomatous disease. Abnormality. Electronically Signed   By: Nolon Nations M.D.   On: 10/21/2015 19:25    ____________________________________________   PROCEDURES  Procedure(s) performed:   Procedures  None ____________________________________________   INITIAL IMPRESSION / ASSESSMENT AND PLAN / ED COURSE  Pertinent labs & imaging results that were available during my care of the patient were reviewed by me and considered in my medical decision making (see chart  for details).  Patient presents to the emergency department for evaluation of intermittent chest pain for the last 3 days. Pain symptoms have been present at rest. Describes some radiation of discomfort down both arms and has some associated dyspnea. Patient has known coronary artery disease but has not required stenting in the past. Initial troponin is negative. Reviewed EKG which shows A. fib, rate controlled, no acute ischemia pattern. Plan to discuss the case with cardiology given that they follow him closely. He has not had a cath since 2010 and new symptoms now somewhat concerning for unstable angina.   Discussed case with Cardiology who recommend admission to medicine service with provocative testing and based on that result they will consider heart cath at that time. Updated patient and family.   Spoke with internal medicine team regarding admission. They will follow with Cardiology and place admission orders. No return of chest pressure while in the ED.  ____________________________________________  FINAL CLINICAL IMPRESSION(S) / ED DIAGNOSES  Final diagnoses:  Chest pain, unspecified chest pain type     MEDICATIONS GIVEN DURING THIS VISIT:  Medications  metoprolol tartrate (LOPRESSOR) tablet 12.5 mg (12.5 mg Oral Given 10/21/15 2325)  lisinopril (PRINIVIL,ZESTRIL) tablet 40 mg (not administered)  allopurinol (ZYLOPRIM) tablet 300 mg (not administered)  fluticasone (FLONASE) 50 MCG/ACT nasal spray 1 spray (not administered)  pantoprazole (PROTONIX) EC tablet 80 mg (not administered)  diltiazem (CARDIZEM CD) 24 hr capsule 240 mg (not administered)  colchicine tablet 0.6 mg (not administered)  Warfarin - Pharmacist Dosing Inpatient (0 each Does not apply Hold 10/22/15 1800)  acetaminophen (TYLENOL) tablet 650 mg (not administered)  ondansetron (ZOFRAN) injection 4 mg (not administered)     NEW OUTPATIENT MEDICATIONS STARTED DURING THIS VISIT:  None   Note:  This document was  prepared using Dragon voice recognition software and may include unintentional dictation errors.  Nanda Quinton, MD Emergency Medicine   Margette Fast, MD 10/22/15 [id number]

## 2015-10-21 NOTE — H&P (Signed)
History and Physical    [name] [id number] DOB: 1930-10-23 DOA: 10/21/2015   PCP: Elsie Stain, MD Chief Complaint:  Chief Complaint  Patient presents with  . Chest Pain  . Shortness of Breath    HPI: [name] is a 82 y.o. male with medical history significant of A.Fib on rate control meds and coumadin, HTN, non-obstructive CAD on cath in 2010.  Patient presents to the ED with c/o chest pain.  Symptoms onset 3 days ago, have been intermittent with pain radiating down both arms.  Resolve spontaneously and do not appear to be associated with exertion.  Has been having several week history of worsening exertional dyspnea.  No fevers nor chills.  No association with food.  ED Course: Trop negative, EKG just shows A.Fib.  Review of Systems: As per HPI otherwise 10 point review of systems negative.    Past Medical History:  Diagnosis Date  . Atrial fibrillation ([place])   . Coronary artery disease   . Diverticulosis   . Esophageal reflux   . Esophageal stricture from GERD 06/08/2011   With stricture at GE junction on EGD, dilated 06/2011   . Gastric ulcer    on EGD 2013  . Gout, unspecified   . Hyperlipidemia   . Hypertension   . Obesity, unspecified   . Orthopnea   . OSA (obstructive sleep apnea)    CPAP at night  . Paroxysmal nocturnal dyspnea   . Vertigo     Past Surgical History:  Procedure Laterality Date  . BALLOON DILATION N/A 10/22/2014   Procedure: BALLOON DILATION;  Surgeon: [name], MD;  Location: [facility];  Service: Endoscopy;  Laterality: N/A;  . Carotid Dopplers  09/2009   no sig extracranial stenosis and vertebral arteries had antegrade flow  . CATARACT EXTRACTION    . ESOPHAGOGASTRODUODENOSCOPY    . ESOPHAGOGASTRODUODENOSCOPY (EGD) WITH PROPOFOL N/A 10/22/2014   Procedure: ESOPHAGOGASTRODUODENOSCOPY (EGD) WITH PROPOFOL;  Surgeon: [name], MD;  Location: [facility];  Service: Endoscopy;  Laterality: N/A;  . MRI of brain  09/2009     chronic microvascular ischemia  . OSA Sleep Study     per Dr. Brett Fairy  . ROTATOR CUFF REPAIR  2008   right     reports that he quit smoking about 46 years ago. His smoking use included Cigarettes. He quit after 4.00 years of use. He has never used smokeless tobacco. He reports that he does not drink alcohol or use drugs.  Allergies  Allergen Reactions  . Penicillins Shortness Of Breath and Swelling    Has patient had a PCN reaction causing immediate rash, facial/tongue/throat swelling, SOB or lightheadedness with hypotensio: YES Has patient had a PCN reaction causing severe rash involving mucus membranes or skin necrosis: NO Has patient had a PCN reaction that required hospitalization NO Has patient had a PCN reaction occurring within the last 10 years: NO If all of the above answers are "NO", then may proceed with Cephalosporin use.  [name] Doxycycline     GI upset    Family History  Problem Relation Age of Onset  . Stomach cancer Mother     died in 57's   . Hypertension Father     died in his 91's pna likely dementia  . Dementia Father   . Pneumonia Father   . Heart disease Brother     S/P CABG  . Hypertension Other   . Prostate cancer Neg Hx   . Colon cancer Neg  Hx       Prior to Admission medications   Medication Sig Start Date End Date Taking? Authorizing Provider  allopurinol (ZYLOPRIM) 300 MG tablet Take 1 tablet (300 mg total) by mouth daily. 05/24/15  Yes Tonia Ghent, MD  colchicine 0.6 MG tablet Take 1 tablet (0.6 mg total) by mouth daily as needed (for gout). Fill with colcrys 10/17/15  Yes Tonia Ghent, MD  diltiazem (CARDIZEM CD) 240 MG 24 hr capsule Take 1 capsule (240 mg total) by mouth daily. 10/17/15  Yes Sherren Mocha, MD  fluticasone [facility]) 50 MCG/ACT nasal spray Place 1 spray into both nostrils daily as needed for allergies.    Yes Historical Provider, MD  lisinopril (PRINIVIL,ZESTRIL) 40 MG tablet TAKE 1 TABLET EVERY DAY 02/06/15  Yes Sherren Mocha, MD  metoprolol tartrate (LOPRESSOR) 25 MG tablet TAKE 1/2 TABLET BY MOUTH TWICE A DAY 02/06/15  Yes Sherren Mocha, MD  omeprazole (PRILOSEC) 40 MG capsule Take 1 capsule (40 mg total) by mouth daily. 09/30/15  Yes [name], MD  warfarin (COUMADIN) 4 MG tablet Take 2-4 mg by mouth daily. Take 2mg  on Mon, wed, sun. Whole tablet rest of days per patient   Yes Historical Provider, MD    Physical Exam: Vitals:   10/21/15 1808 10/21/15 1933 10/21/15 2116  BP: 131/78 139/73 (!) 147/106  Pulse: 75 73   Resp: 18 18 16   Temp: 98.6 F (37 C) 98 F (36.7 C)   TempSrc: Oral Oral   SpO2: 98% 96% 99%  Weight: 93 kg (205 lb)    Height: 5\' 6"  (1.676 m)        Constitutional: NAD, calm, comfortable Eyes: PERRL, lids and conjunctivae normal ENMT: Mucous membranes are moist. Posterior pharynx clear of any exudate or lesions.Normal dentition.  Neck: normal, supple, no masses, no thyromegaly Respiratory: clear to auscultation bilaterally, no wheezing, no crackles. Normal respiratory effort. No accessory muscle use.  Cardiovascular: Regular rate and rhythm, no murmurs / rubs / gallops. No extremity edema. 2+ pedal pulses. No carotid bruits.  Abdomen: no tenderness, no masses palpated. No hepatosplenomegaly. Bowel sounds positive.  Musculoskeletal: no clubbing / cyanosis. No joint deformity upper and lower extremities. Good ROM, no contractures. Normal muscle tone.  Skin: no rashes, lesions, ulcers. No induration Neurologic: CN 2-12 grossly intact. Sensation intact, DTR normal. Strength 5/5 in all 4.  Psychiatric: Normal judgment and insight. Alert and oriented x 3. Normal mood.    Labs on Admission: I have personally reviewed following labs and imaging studies  CBC:  Recent Labs Lab 10/21/15 1809  WBC 5.9  HGB 15.8  HCT 46.5  MCV 88.6  PLT [id number]   Basic Metabolic Panel:  Recent Labs Lab 10/21/15 1809  NA 136  K 4.6  CL 104  CO2 23  GLUCOSE 149*  BUN 15  CREATININE  1.28*  CALCIUM 9.0   GFR: Estimated Creatinine Clearance: 47.5 mL/min (by C-G formula based on SCr of 1.28 mg/dL). Liver Function Tests: No results for input(s): AST, ALT, ALKPHOS, BILITOT, PROT, ALBUMIN in the last 168 hours. No results for input(s): LIPASE, AMYLASE in the last 168 hours. No results for input(s): AMMONIA in the last 168 hours. Coagulation Profile:  Recent Labs Lab 10/21/15 2128  INR 3.00   Cardiac Enzymes: No results for input(s): CKTOTAL, CKMB, CKMBINDEX, TROPONINI in the last 168 hours. BNP (last 3 results) No results for input(s): PROBNP in the last 8760 hours. HbA1C: No results for input(s): HGBA1C in  the last 72 hours. CBG: No results for input(s): GLUCAP in the last 168 hours. Lipid Profile: No results for input(s): CHOL, HDL, LDLCALC, TRIG, CHOLHDL, LDLDIRECT in the last 72 hours. Thyroid Function Tests: No results for input(s): TSH, T4TOTAL, FREET4, T3FREE, THYROIDAB in the last 72 hours. Anemia Panel: No results for input(s): VITAMINB12, FOLATE, FERRITIN, TIBC, IRON, RETICCTPCT in the last 72 hours. Urine analysis:    Component Value Date/Time   COLORURINE YELLOW 09/22/2009 1640   APPEARANCEUR CLEAR 09/22/2009 1640   LABSPEC 1.011 09/22/2009 1640   PHURINE 5.5 09/22/2009 1640   GLUCOSEU NEGATIVE 09/22/2009 1640   HGBUR NEGATIVE 09/22/2009 1640   BILIRUBINUR NEGATIVE 09/22/2009 1640   KETONESUR NEGATIVE 09/22/2009 1640   PROTEINUR NEGATIVE 09/22/2009 1640   UROBILINOGEN 0.2 09/22/2009 1640   NITRITE NEGATIVE 09/22/2009 1640   LEUKOCYTESUR  09/22/2009 1640    NEGATIVE MICROSCOPIC NOT DONE ON URINES WITH NEGATIVE PROTEIN, BLOOD, LEUKOCYTES, NITRITE, OR GLUCOSE <1000 mg/dL.   Sepsis Labs: @LABRCNTIP (procalcitonin:4,lacticidven:4) )No results found for this or any previous visit (from the past 240 hour(s)).   Radiological Exams on Admission: Dg Chest 2 View  Result Date: 10/21/2015 CLINICAL DATA:  Chest pain and shortness of breath since  Friday. Pain radiates through bilateral arms. EXAM: CHEST  2 VIEW COMPARISON:  09/22/2009 FINDINGS: Heart size is normal. Small calcified pulmonary nodules are consistent with prior granulomatous disease. Calcifications are identified within the mediastinal lymph nodes. There are no focal consolidations or pleural effusions. IMPRESSION: No evidence for acute Prior granulomatous disease. Abnormality. Electronically Signed   By: Nolon Nations M.D.   On: 10/21/2015 19:25    EKG: Independently reviewed.  Assessment/Plan Principal Problem:   Chest pain Active Problems:   Essential hypertension   Atrial fibrillation (HCC)   Chest pain -  Cards consulted   CP obs pathway  Serial trops  Tele monitor  NPO after midnight  A.Fib  Rate controlled  Continue coumadin  For now continuing rate control meds unless cards decides otherwise (may be difficult to stress test on these but will defer to cards)  HTN - continue home meds   DVT prophylaxis: coumadin Code Status: Full Family Communication: Family at bedside Consults called: Cardiology called by EDP Admission status: Admit to obs   Etta Quill DO [facility] Pager [phone] from 7PM-7AM  If 7AM-7PM, please contact the day physician for the patient [email or web address] Password [email or web address]  10/21/2015, 10:19 PM

## 2015-10-21 NOTE — ED Triage Notes (Signed)
Pt complaining of chest pain and shortness of breath since friday. Pt states pain radiates through bilateral arms.

## 2015-10-22 DIAGNOSIS — R079 Chest pain, unspecified: Secondary | ICD-10-CM | POA: Diagnosis not present

## 2015-10-22 DIAGNOSIS — I25110 Atherosclerotic heart disease of native coronary artery with unstable angina pectoris: Secondary | ICD-10-CM | POA: Diagnosis not present

## 2015-10-22 DIAGNOSIS — I480 Paroxysmal atrial fibrillation: Secondary | ICD-10-CM | POA: Diagnosis not present

## 2015-10-22 DIAGNOSIS — R7302 Impaired glucose tolerance (oral): Secondary | ICD-10-CM | POA: Diagnosis present

## 2015-10-22 LAB — TROPONIN I

## 2015-10-22 LAB — CBC
HEMATOCRIT: 44.9 % (ref 39.0–52.0)
Hemoglobin: 15.1 g/dL (ref 13.0–17.0)
MCH: 30 pg (ref 26.0–34.0)
MCHC: 33.6 g/dL (ref 30.0–36.0)
MCV: 89.1 fL (ref 78.0–100.0)
Platelets: 153 10*3/uL (ref 150–400)
RBC: 5.04 MIL/uL (ref 4.22–5.81)
RDW: 14.5 % (ref 11.5–15.5)
WBC: 5.6 10*3/uL (ref 4.0–10.5)

## 2015-10-22 LAB — PROTIME-INR
INR: 3.12
PROTHROMBIN TIME: 32.8 s — AB (ref 11.4–15.2)

## 2015-10-22 NOTE — Consult Note (Signed)
Cardiology Consult    Patient ID: NOLIN GRANIER MRN: [id number], DOB/AGE: Aug 09, 1930   Admit date: 10/21/2015 Date of Consult: 10/22/2015  Primary Physician: Elsie Stain, MD Reason for Consult: chest pain  Primary Cardiologist: Dr. Burt Knack Requesting Provider: Dr. Maudie Mercury  Patient Profile  Mr. Kopecky is a 82 year old male with a past medical history of non obstructive CAD, paroxsymal Afib, HTN, HLD, and obesity. He presented to the ED on 10/22/15 with intermittent chest discomfort with radiation to his arms.   History of Present Illness  Mr. Nolton was reaching to pick up a vacuum cleaner on Friday 10/18/15 when he developed acute onset substernal chest pain that he describes as a sharp, stabbing pain. Pain radiated to his right and left arms. He did feel SOB, had some nausea and endorses diaphoresis. His pain lasted about 20 minutes and resolved once he rested.   He has had intermittent pain and pressure since Friday. His pain has lasted a few seconds to a few minutes since Friday. He does have some shortness of breath when the episodes occur, but none of them have been as severe as Friday's episode of pain.   He had a left heart cath in May of 2010. At that time he had an EF of 65%, no obstructive CAD. His circumflex had 40-50% plaque. Cath was done as he presented with SOB.   He tells me that he has never had chest pain like this before. He follows with Dr. Burt Knack for atrial fibrillation and was seen last week. He was doing well, his diltiazem was increased at last visit for better rate control.   He is not a smoker, does not drink alcohol. He has a family history of CAD, his brother had CABG in his 72's and his grandfather died from MI, age unknown.   Past Medical History   Past Medical History:  Diagnosis Date  . Atrial fibrillation ([place])   . Coronary artery disease   . Diverticulosis   . Esophageal reflux   . Esophageal stricture from GERD 06/08/2011   With stricture at GE  junction on EGD, dilated 06/2011   . Gastric ulcer    on EGD 2013  . Gout, unspecified   . Hyperlipidemia   . Hypertension   . Obesity, unspecified   . Orthopnea   . OSA (obstructive sleep apnea)    CPAP at night  . Paroxysmal nocturnal dyspnea   . Vertigo     Past Surgical History:  Procedure Laterality Date  . BALLOON DILATION N/A 10/22/2014   Procedure: BALLOON DILATION;  Surgeon: [name], MD;  Location: [facility];  Service: Endoscopy;  Laterality: N/A;  . Carotid Dopplers  09/2009   no sig extracranial stenosis and vertebral arteries had antegrade flow  . CATARACT EXTRACTION    . ESOPHAGOGASTRODUODENOSCOPY    . ESOPHAGOGASTRODUODENOSCOPY (EGD) WITH PROPOFOL N/A 10/22/2014   Procedure: ESOPHAGOGASTRODUODENOSCOPY (EGD) WITH PROPOFOL;  Surgeon: [name], MD;  Location: [facility];  Service: Endoscopy;  Laterality: N/A;  . MRI of brain  09/2009   chronic microvascular ischemia  . OSA Sleep Study     per Dr. Brett Fairy  . ROTATOR CUFF REPAIR  2008   right     Allergies  Allergies  Allergen Reactions  . Penicillins Shortness Of Breath and Swelling    Has patient had a PCN reaction causing immediate rash, facial/tongue/throat swelling, SOB or lightheadedness with hypotensio: YES Has patient had a PCN reaction causing severe rash  involving mucus membranes or skin necrosis: NO Has patient had a PCN reaction that required hospitalization NO Has patient had a PCN reaction occurring within the last 10 years: NO If all of the above answers are "NO", then may proceed with Cephalosporin use.  [name] Doxycycline     GI upset    Inpatient Medications    . allopurinol  300 mg Oral Daily  . diltiazem  240 mg Oral Daily  . lisinopril  40 mg Oral Daily  . metoprolol tartrate  12.5 mg Oral BID  . pantoprazole  80 mg Oral Daily  . Warfarin - Pharmacist Dosing Inpatient   Does not apply q1800    Family History    Family History  Problem Relation Age of Onset  . Stomach  cancer Mother     died in 81's   . Hypertension Father     died in his 27's pna likely dementia  . Dementia Father   . Pneumonia Father   . Heart disease Brother     S/P CABG  . Hypertension Other   . Prostate cancer Neg Hx   . Colon cancer Neg Hx     Social History    Social History   Social History  . Marital status: Married    Spouse name: N/A  . Number of children: 3  . Years of education: N/A   Occupational History  . 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 Smoking status: Former Smoker    Years: 4.00    Types: Cigarettes    Quit date: 03/16/1969  . Smokeless tobacco: Never Used     Comment: 1 pack/week for about 4 years, quit 1971  . Alcohol use No     Comment: Quit all alcohol in 1970's  . Drug use: No  . Sexual activity: Not on file   Other Topics Concern  . Not on file   Social History Narrative   Lives in [place], married 1953, widowed after 73 years   Retired Administrator, still does Biomedical scientist.   He is not routinely exercising.   Daily caffeine      Review of Systems    General:  No chills, fever, night sweats or weight changes.  Cardiovascular:  No chest pain, dyspnea on exertion, edema, orthopnea, palpitations, paroxysmal nocturnal dyspnea. Dermatological: No rash, lesions/masses Respiratory: No cough, dyspnea Urologic: No hematuria, dysuria Abdominal:   No nausea, vomiting, diarrhea, bright red blood per rectum, melena, or hematemesis Neurologic:  No visual changes, wkns, changes in mental status. All other systems reviewed and are otherwise negative except as noted above.  Physical Exam    Blood pressure (!) 136/92, pulse 79, temperature 97.6 F (36.4 C), temperature source Axillary, resp. rate 20, height 5\' 6"  (1.676 m), weight 212 lb 11.2 oz (96.5 kg), SpO2 98 %.  General: Pleasant, NAD Psych: Normal affect. Neuro: Alert and oriented X 3. Moves all extremities spontaneously. HEENT:  Normal  Neck: Supple without bruits or JVD. Lungs:  Resp regular and unlabored, CTA. Heart: RRR no s3, s4, or murmurs. Abdomen: Soft, non-tender, non-distended, BS + x 4.  Extremities: No clubbing, cyanosis or edema. DP/PT/Radials 2+ and equal bilaterally.  Labs    Troponin [facility] of Care Test)  Recent Labs  10/21/15 1827  TROPIPOC 0.00    Recent Labs  10/21/15 2224 10/22/15 0119 10/22/15 0322  TROPONINI <0.03 <0.03 <0.03   Lab Results  Component Value Date  WBC 5.6 10/22/2015   HGB 15.1 10/22/2015   HCT 44.9 10/22/2015   MCV 89.1 10/22/2015   PLT 153 10/22/2015    Recent Labs Lab 10/21/15 1809  NA 136  K 4.6  CL 104  CO2 23  BUN 15  CREATININE 1.28*  CALCIUM 9.0  GLUCOSE 149*   Lab Results  Component Value Date   CHOL 160 12/06/2014   HDL 28.30 (L) 12/06/2014   LDLCALC 94 08/22/2013   TRIG 268.0 (H) 12/06/2014   Lab Results  Component Value Date   DDIMER  08/01/2008    0.48        AT THE INHOUSE ESTABLISHED CUTOFF VALUE OF 0.48 ug/mL FEU, THIS ASSAY HAS BEEN DOCUMENTED IN THE LITERATURE TO HAVE A SENSITIVITY AND NEGATIVE PREDICTIVE VALUE OF AT LEAST 98 TO 99%.  THE TEST RESULT SHOULD BE CORRELATED WITH AN ASSESSMENT OF THE CLINICAL PROBABILITY OF DVT / VTE.     Radiology Studies    Dg Chest 2 View  Result Date: 10/21/2015 CLINICAL DATA:  Chest pain and shortness of breath since Friday. Pain radiates through bilateral arms. EXAM: CHEST  2 VIEW COMPARISON:  09/22/2009 FINDINGS: Heart size is normal. Small calcified pulmonary nodules are consistent with prior granulomatous disease. Calcifications are identified within the mediastinal lymph nodes. There are no focal consolidations or pleural effusions. IMPRESSION: No evidence for acute Prior granulomatous disease. Abnormality. Electronically Signed   By: Nolon Nations M.D.   On: 10/21/2015 19:25    EKG & Cardiac Imaging    EKG: Afib, no ST elevation, there are some nonspecific ST changes.    Echocardiogram: pending.   ANGIOGRAPHIC DATA:  1. On plain fluoroscopy there appears to be pretty extensive      calcification of the mitral annulus and perhaps part of the      leaflet.  2. The left ventriculography in the RAO projection reveals vigorous      global systolic function with EF in excess of 65%.  No significant      mitral regurgitation is noted.  3. The left main is free of critical disease.  4. The LAD courses to the apex and provides one major diagonal branch.      The LAD has minor luminal irregularities but no significant high-      grade focal abnormalities.  5. The circumflex provides a large marginal and then a second      marginal.  Prior to the bifurcation of the marginals, there is a      focal somewhat eccentric plaque of 40-50% at most.  The large      marginal, representing OM1 has a diffuse area of 40% luminal      irregularity.  The distal vessel was intact.  The second marginal      or AV circumflex is without critical narrowing.  6. The right coronary artery is somewhat smaller caliber vessel with      diffuse 30% plaquing in the midportion.  It divides distally into a      acute marginal/PDA subbranch and a posterolateral system all of      which are without critical narrowing.   CONCLUSIONS:  1. Well-preserved left ventricular function with mitral annular      calcification and no significant mitral regurgitation.  2. Scattered diffuse plaquing of the circumflex coronary artery that      does not appear to be critical.  3. Scattered irregularities as noted      Assessment & Plan  1. Chest pain concerning for ACS: Presents with chest pain and pressure with radiation to his arms. He endorses associated SOB, diaphoresis and nausea. EKG shows atrial fibrillation not concerning for ischemia. Troponin negative x 3, however his symptoms are concerning for ACS. He has risk factors including obesity, HLD, and family history. Would likely benefit from  cardiac cath, but INR is elevated at 3.12, will need to hold warfarin. Check INR daily.   2. Chronic atrial fibrillation: rate controlled on 240mg  diltiazem. Continue same. On Warfarin. INR is 3.12 today, see discussion above.   This patients CHA2DS2-VASc Score and unadjusted Ischemic Stroke Rate (% per year) is equal to 3.2 % stroke rate/year from a score of 3 Above score calculated as 1 point each if present [CHF, HTN, DM, Vascular=MI/PAD/Aortic Plaque, Age if 65-74, or Male], 2 points each if present [Age > 75, or Stroke/TIA/TE]  3. HTN: hypertensive. Would increase metoprolol to 25mg  BID. Also on ACE-I, continue same. Creatinine is stable.   4. HLD: Not on statin. Would repeat lipid panel in the am and likely add high intensity statin.   Signed, Arbutus Leas, NP 10/22/2015, 10:55 AM Pager: [phone]  I have personally seen and examined this patient with Jettie Booze, NP. I agree with the assessment and plan as outlined above. He is known to have moderate CAD by cath in 2010 with 50% mid Circumflex stenosis, 40% RCA stenosis and mild LAD disease. He has had no chest pain over the last 7 years. He developed chest pain 4 days ago while walking. He did not strain his chest wall with lifting. This was severe chest pressure with radiation to his jaw and both arms. He has continued to have episodes of chest tightness associated with dyspnea since then. He is on coumadin for atrial fib. INR 3.1 today. Exam with normal appearing, comfortable WM in NAD, alert and oriented. Lungs clear. No JVD. CV irreg irreg with no murmurs. Abd distended, firm. 1+ bilateral LE edema. Labs reviewed by me. EKG reviewed by me. No ischemic changes.  I have had a long discussion with the patient and his daughter today. His symptoms are worrisome for unstable angina. He has had no similar symptoms over the last 7 years since his last cath. He has GERD but this is not typically associated with arm pain, jaw pain and dyspnea. We  have discussed stress testing vs cardiac cath. He prefers to proceed with cardiac cath to exclude progression of CAD. Will continue to hold coumadin and plan cardiac cath from right radial approach later this week when INR less than 2.0. Troponin is negative x 3.   Lauree Chandler 10/22/2015 12:24 PM

## 2015-10-22 NOTE — Progress Notes (Signed)
Placed patient on CPAP for the night via auto-mode. Patient using his home mask.

## 2015-10-22 NOTE — Progress Notes (Signed)
ANTICOAGULATION CONSULT NOTE - Initial Consult  Pharmacy Consult for Coumadin Indication: atrial fibrillation  Allergies  Allergen Reactions  . Penicillins Shortness Of Breath and Swelling    Has patient had a PCN reaction causing immediate rash, facial/tongue/throat swelling, SOB or lightheadedness with hypotensio: YES Has patient had a PCN reaction causing severe rash involving mucus membranes or skin necrosis: NO Has patient had a PCN reaction that required hospitalization NO Has patient had a PCN reaction occurring within the last 10 years: NO If all of the above answers are "NO", then may proceed with Cephalosporin use.  [name] Doxycycline     GI upset    Patient Measurements: Height: 5\' 6"  (167.6 cm) Weight: 212 lb 11.2 oz (96.5 kg) IBW/kg (Calculated) : 63.8  Vital Signs: Temp: 97.6 F (36.4 C) (08/08 0431) Temp Source: Axillary (08/08 0431) BP: 136/92 (08/08 0431) Pulse Rate: 79 (08/08 0431)  Labs:  Recent Labs  10/21/15 1809 10/21/15 2128 10/21/15 2224 10/22/15 0119 10/22/15 0322  HGB 15.8  --   --  15.1  --   HCT 46.5  --   --  44.9  --   PLT 166  --   --  153  --   LABPROT  --  31.8*  --  32.8*  --   INR  --  3.00  --  3.12  --   CREATININE 1.28*  --   --   --   --   TROPONINI  --   --  <0.03 <0.03 <0.03    Estimated Creatinine Clearance: 48.4 mL/min (by C-G formula based on SCr of 1.28 mg/dL).   Medical History: Past Medical History:  Diagnosis Date  . Atrial fibrillation ([place])   . Coronary artery disease   . Diverticulosis   . Esophageal reflux   . Esophageal stricture from GERD 06/08/2011   With stricture at GE junction on EGD, dilated 06/2011   . Gastric ulcer    on EGD 2013  . Gout, unspecified   . Hyperlipidemia   . Hypertension   . Obesity, unspecified   . Orthopnea   . OSA (obstructive sleep apnea)    CPAP at night  . Paroxysmal nocturnal dyspnea   . Vertigo      Assessment: 82 yo M presents on 8/7 with CP/SOB and noted with afib.  [name] Has a history of Afib on Coumadin.  -INR= 3.12 with trend up  Home coumadin dose: 4mg  daily exc 2mg  on Mon, Wed, and Sun. Last dose was taken today on 8/7. INR on admit wastherapeutic at 3.0.   Goal of Therapy:  INR 2-3 Monitor platelets by anticoagulation protocol: Yes   Plan:  -hold coumadin today -Daily PT/INR  Hildred Laser, Pharm D 10/22/2015 8:26 AM

## 2015-10-22 NOTE — Care Management Obs Status (Signed)
[place] NOTIFICATION   Patient Details  Name: BADR CRISANTOS MRN: [id number] Date of Birth: 17-Aug-1930   Medicare Observation Status Notification Given:  Yes    Dawayne Patricia, RN 10/22/2015, 12:23 PM

## 2015-10-22 NOTE — Discharge Instructions (Signed)
Please follow up with your cardiologist Dr. Burt Knack in 1 week for f/u of chest pain, and your primary care physician in 2 weeks.

## 2015-10-22 NOTE — Progress Notes (Signed)
Patient ID: [name], male   DOB: September 15, 1930, 82 y.o.   MRN: [id number]                                                                PROGRESS NOTE                                                                                                                                                                                                             Patient Demographics:    [name], is a 82 y.o. male, DOB - October 19, 1930, [id number]  Admit date - 10/21/2015   Admitting Physician Etta Quill, DO  Outpatient Primary MD for the patient is Elsie Stain, MD  LOS - 0  Outpatient Specialists: Dr. Burt Knack  Chief Complaint  Patient presents with  . Chest Pain  . Shortness of Breath       Brief Narrative  82 y.o. male with medical history significant of A.Fib on rate control meds and coumadin, HTN, non-obstructive CAD on cath in 2010.  Patient presents to the ED with c/o chest pain.  Symptoms onset 3 days ago, have been intermittent with pain radiating down both arms.  Resolve spontaneously and do not appear to be associated with exertion.  Has been having several week history of worsening exertional dyspnea.  No fevers nor chills.  No association with food.  ED Course: Trop negative, EKG just shows A.Fib.   Subjective:    [name] today has no further chest pain.   No headache, No chest pain, No abdominal pain - No Nausea, No new weakness tingling or numbness, No Cough - SOB.    Assessment  & Plan :    Principal Problem:   Chest pain Active Problems:   Essential hypertension   Atrial fibrillation (HCC)  Chest pain Cont tele Trop negative x3.  NPO Awaiting cardiology input, apparently consulted by [place] physician  Pafib Cont cardizem, metoprolol Coumadin pharmacy to dose  Glucose intolerance (hga1c=6.1)      Code Status : FULL CODE  Family Communication  :   Disposition Plan  : home   Barriers For Discharge :   Consults  :   cardiology  Procedures  :   DVT Prophylaxis  :  coumadin  Lab Results  Component Value Date  PLT 153 10/22/2015    Antibiotics  :    Anti-infectives    None        Objective:   Vitals:   10/21/15 2200 10/21/15 2230 10/21/15 2310 10/22/15 0431  BP: 151/94 145/88 (!) 156/75 (!) 136/92  Pulse: 71 81 83 79  Resp: 24 21 (!) 22 20  Temp:   97.9 F (36.6 C) 97.6 F (36.4 C)  TempSrc:   Oral Axillary  SpO2: 98% 97% 97% 98%  Weight:   96.5 kg (212 lb 11.2 oz)   Height:   5\' 6"  (1.676 m)     Wt Readings from Last 3 Encounters:  10/21/15 96.5 kg (212 lb 11.2 oz)  10/17/15 94.5 kg (208 lb 6.4 oz)  05/24/15 98.4 kg (217 lb)    No intake or output data in the 24 hours ending 10/22/15 0815   Physical Exam  Awake Alert, Oriented X 3, No new F.N deficits, Normal affect [place].AT,PERRAL Supple Neck,No JVD, No cervical lymphadenopathy appriciated.  Symmetrical Chest wall movement, Good air movement bilaterally, CTAB Irr, Irr, s1, s2  ,No Gallops,Rubs or new Murmurs, No Parasternal Heave +ve B.Sounds, Abd Soft, No tenderness, No organomegaly appriciated, No rebound - guarding or rigidity. No Cyanosis, Clubbing or edema, No new Rash or bruise      Data Review:    CBC  Recent Labs Lab 10/21/15 1809 10/22/15 0119  WBC 5.9 5.6  HGB 15.8 15.1  HCT 46.5 44.9  PLT 166 153  MCV 88.6 89.1  MCH 30.1 30.0  MCHC 34.0 33.6  RDW 14.6 14.5    Chemistries   Recent Labs Lab 10/21/15 1809  NA 136  K 4.6  CL 104  CO2 23  GLUCOSE 149*  BUN 15  CREATININE 1.28*  CALCIUM 9.0   ------------------------------------------------------------------------------------------------------------------ No results for input(s): CHOL, HDL, LDLCALC, TRIG, CHOLHDL, LDLDIRECT in the last 72 hours.  Lab Results  Component Value Date   HGBA1C  07/31/2008    6.1 (NOTE) The ADA recommends the following therapeutic goal for glycemic control related to Hgb A1c measurement: Goal of therapy:  <6.5 Hgb A1c  Reference: American Diabetes Association: Clinical Practice Recommendations 2010, Diabetes Care, 2010, 33: (Suppl  1).   ------------------------------------------------------------------------------------------------------------------ No results for input(s): TSH, T4TOTAL, T3FREE, THYROIDAB in the last 72 hours.  Invalid input(s): FREET3 ------------------------------------------------------------------------------------------------------------------ No results for input(s): VITAMINB12, FOLATE, FERRITIN, TIBC, IRON, RETICCTPCT in the last 72 hours.  Coagulation profile  Recent Labs Lab 10/21/15 2128 10/22/15 0119  INR 3.00 3.12    No results for input(s): DDIMER in the last 72 hours.  Cardiac Enzymes  Recent Labs Lab 10/21/15 2224 10/22/15 0119 10/22/15 0322  TROPONINI <0.03 <0.03 <0.03   ------------------------------------------------------------------------------------------------------------------ No results found for: BNP  Inpatient Medications  Scheduled Meds: . allopurinol  300 mg Oral Daily  . diltiazem  240 mg Oral Daily  . lisinopril  40 mg Oral Daily  . metoprolol tartrate  12.5 mg Oral BID  . pantoprazole  80 mg Oral Daily  . Warfarin - Pharmacist Dosing Inpatient   Does not apply q1800   Continuous Infusions:  PRN Meds:.acetaminophen, colchicine, fluticasone, ondansetron (ZOFRAN) IV  Micro Results No results found for this or any previous visit (from the past 240 hour(s)).  Radiology Reports Dg Chest 2 View  Result Date: 10/21/2015 CLINICAL DATA:  Chest pain and shortness of breath since Friday. Pain radiates through bilateral arms. EXAM: CHEST  2 VIEW COMPARISON:  09/22/2009 FINDINGS: Heart size is normal. Small calcified  pulmonary nodules are consistent with prior granulomatous disease. Calcifications are identified within the mediastinal lymph nodes. There are no focal consolidations or pleural effusions. IMPRESSION: No evidence for  acute Prior granulomatous disease. Abnormality. Electronically Signed   By: Nolon Nations M.D.   On: 10/21/2015 19:25    Time Spent in minutes  30   Jani Gravel M.D on 10/22/2015 at 8:15 AM  Between 7am to 7pm - Pager - [phone]  After 7pm go to [email or web address] - password [email or web address]  [facility] -  Office  [phone]

## 2015-10-22 NOTE — Progress Notes (Signed)
Placed patient on CPAP for the night via auto-mode with minimum pressure set at 5cm and maximum pressure set at 20cm. Patient stated he wears CPAP at home.

## 2015-10-22 NOTE — Progress Notes (Signed)
Patient sitting up in chair. No needs at this time, daughter at bedside, call light within reach.

## 2015-10-23 ENCOUNTER — Encounter (HOSPITAL_COMMUNITY): Payer: Self-pay | Admitting: General Practice

## 2015-10-23 DIAGNOSIS — I10 Essential (primary) hypertension: Secondary | ICD-10-CM

## 2015-10-23 DIAGNOSIS — I481 Persistent atrial fibrillation: Secondary | ICD-10-CM

## 2015-10-23 DIAGNOSIS — R079 Chest pain, unspecified: Secondary | ICD-10-CM | POA: Diagnosis not present

## 2015-10-23 DIAGNOSIS — I25110 Atherosclerotic heart disease of native coronary artery with unstable angina pectoris: Secondary | ICD-10-CM | POA: Diagnosis not present

## 2015-10-23 LAB — COMPREHENSIVE METABOLIC PANEL
ALT: 29 U/L (ref 17–63)
AST: 25 U/L (ref 15–41)
Albumin: 3.6 g/dL (ref 3.5–5.0)
Alkaline Phosphatase: 70 U/L (ref 38–126)
Anion gap: 9 (ref 5–15)
BUN: 15 mg/dL (ref 6–20)
CHLORIDE: 101 mmol/L (ref 101–111)
CO2: 27 mmol/L (ref 22–32)
CREATININE: 1.21 mg/dL (ref 0.61–1.24)
Calcium: 9.2 mg/dL (ref 8.9–10.3)
GFR calc Af Amer: >60 mL/min (ref >60)
GFR calc non Af Amer: 54 mL/min — ABNORMAL LOW (ref >60)
Glucose, Bld: 117 mg/dL — ABNORMAL HIGH (ref 65–99)
Potassium: 4.1 mmol/L (ref 3.5–5.1)
SODIUM: 137 mmol/L (ref 135–145)
Total Bilirubin: 0.5 mg/dL (ref 0.3–1.2)
Total Protein: 6.6 g/dL (ref 6.5–8.1)

## 2015-10-23 LAB — CBC
HCT: 47.7 % (ref 39.0–52.0)
Hemoglobin: 16 g/dL (ref 13.0–17.0)
MCH: 29.5 pg (ref 26.0–34.0)
MCHC: 33.5 g/dL (ref 30.0–36.0)
MCV: 87.8 fL (ref 78.0–100.0)
PLATELETS: 163 10*3/uL (ref 150–400)
RBC: 5.43 MIL/uL (ref 4.22–5.81)
RDW: 14.3 % (ref 11.5–15.5)
WBC: 5.7 10*3/uL (ref 4.0–10.5)

## 2015-10-23 LAB — PROTIME-INR
INR: 2.25
PROTHROMBIN TIME: 25.3 s — AB (ref 11.4–15.2)

## 2015-10-23 MED ORDER — ATORVASTATIN CALCIUM 80 MG PO TABS
80.0000 mg | ORAL_TABLET | Freq: Every day | ORAL | Status: DC
  Administered 2015-10-23 – 2015-10-24 (×2): 80 mg via ORAL
  Filled 2015-10-23 (×2): qty 1

## 2015-10-23 MED ORDER — SODIUM CHLORIDE 0.9% FLUSH
3.0000 mL | Freq: Two times a day (BID) | INTRAVENOUS | Status: DC
  Administered 2015-10-23: 3 mL via INTRAVENOUS

## 2015-10-23 MED ORDER — SODIUM CHLORIDE 0.9% FLUSH: 3.0000 mL | INTRAVENOUS | Status: DC | PRN

## 2015-10-23 MED ORDER — ASPIRIN 81 MG PO CHEW
81.0000 mg | CHEWABLE_TABLET | ORAL | Status: AC
  Administered 2015-10-24: 81 mg via ORAL
  Filled 2015-10-23: qty 1

## 2015-10-23 MED ORDER — SODIUM CHLORIDE 0.9 % IV SOLN
INTRAVENOUS | Status: DC
  Administered 2015-10-24: 05:00:00 via INTRAVENOUS

## 2015-10-23 MED ORDER — SODIUM CHLORIDE 0.9 % IV SOLN: 250.0000 mL | INTRAVENOUS | Status: DC | PRN

## 2015-10-23 NOTE — Progress Notes (Signed)
Pt has home unit and will place when ready. RT instructed pt to notify if assistance is needed

## 2015-10-23 NOTE — Progress Notes (Signed)
Patient Name: [name] Date of Encounter: 10/23/2015  Principal Problem:   Chest pain Active Problems:   Essential hypertension   Atrial fibrillation (HCC)   Glucose intolerance (impaired glucose tolerance)   Primary Cardiologist: Dr. Burt Knack Patient Profile: [name] is a 82 year old male with a past medical history of CAD, paroxsymal Afib, HTN, HLD, and obesity. He presented to the ED on 10/22/15 with unstable angina. Plan for left heart cath when INR <2.   SUBJECTIVE: Feels well, denies chest pain and SOB.   OBJECTIVE Vitals:   10/22/15 1324 10/22/15 2036 10/22/15 2254 10/23/15 0413  BP: (!) 144/74 (!) 149/77  117/70  Pulse: 85 (!) 101 92 86  Resp: 18 18 18 18   Temp: 98 F (36.7 C) 98.1 F (36.7 C)    TempSrc: Oral Oral    SpO2: 99% 97% 97% 98%  Weight:      Height:        Intake/Output Summary (Last 24 hours) at 10/23/15 0748 Last data filed at 10/22/15 2036  Gross per 24 hour  Intake              720 ml  Output                0 ml  Net              720 ml   Filed Weights   10/21/15 1808 10/21/15 2310  Weight: 205 lb (93 kg) 212 lb 11.2 oz (96.5 kg)    PHYSICAL EXAM General: Well developed, well nourished, male in no acute distress. Head: Normocephalic, atraumatic.  Neck: Supple without bruits, no JVD. Lungs:  Resp regular and unlabored, CTA. Heart: IRRR, S1, S2, no S3, S4, or murmur; no rub. Abdomen: Soft, non-tender, non-distended, BS + x 4.  Extremities: No clubbing, cyanosis, no edema.  Neuro: Alert and oriented X 3. Moves all extremities spontaneously. Psych: Normal affect.  LABS: CBC: Recent Labs  10/22/15 0119 10/23/15 0339  WBC 5.6 5.7  HGB 15.1 16.0  HCT 44.9 47.7  MCV 89.1 87.8  PLT 153 163   INR: Recent Labs  10/23/15 0339  INR [id number]   Basic Metabolic Panel: Recent Labs  10/21/15 1809 10/23/15 0339  NA 136 137  K 4.6 4.1  CL 104 101  CO2 23 27  GLUCOSE 149* 117*  BUN 15 15  CREATININE 1.28* 1.21  CALCIUM 9.0  9.2   Liver Function Tests: Recent Labs  10/23/15 0339  AST 25  ALT 29  ALKPHOS 70  BILITOT 0.5  PROT 6.6  ALBUMIN 3.6   Cardiac Enzymes: Recent Labs  10/21/15 2224 10/22/15 0119 10/22/15 0322  TROPONINI <0.03 <0.03 <0.03    Recent Labs  10/21/15 1827  TROPIPOC 0.00     Current Facility-Administered Medications:  .  acetaminophen (TYLENOL) tablet 650 mg, 650 mg, Oral, Q4H PRN, Etta Quill, DO .  allopurinol (ZYLOPRIM) tablet 300 mg, 300 mg, Oral, Daily, Etta Quill, DO, 300 mg at 10/22/15 1035 .  colchicine tablet 0.6 mg, 0.6 mg, Oral, Daily PRN, Etta Quill, DO .  diltiazem (CARDIZEM CD) 24 hr capsule 240 mg, 240 mg, Oral, Daily, Etta Quill, DO, Stopped at 10/22/15 1026 .  fluticasone (FLONASE) 50 MCG/ACT nasal spray 1 spray, 1 spray, Each Nare, Daily PRN, Etta Quill, DO .  lisinopril (PRINIVIL,ZESTRIL) tablet 40 mg, 40 mg, Oral, Daily, Etta Quill, DO, 40 mg at 10/22/15 1035 .  metoprolol tartrate (LOPRESSOR) tablet 12.5 mg, 12.5 mg, Oral, BID, Etta Quill, DO, 12.5 mg at 10/22/15 2159 .  ondansetron (ZOFRAN) injection 4 mg, 4 mg, Intravenous, Q6H PRN, Etta Quill, DO .  pantoprazole (PROTONIX) EC tablet 80 mg, 80 mg, Oral, Daily, Etta Quill, DO, 80 mg at 10/22/15 1035   TELE: Afib rate controlled in 80-90's      Radiology/Studies: Dg Chest 2 View  Result Date: 10/21/2015 CLINICAL DATA:  Chest pain and shortness of breath since Friday. Pain radiates through bilateral arms. EXAM: CHEST  2 VIEW COMPARISON:  09/22/2009 FINDINGS: Heart size is normal. Small calcified pulmonary nodules are consistent with prior granulomatous disease. Calcifications are identified within the mediastinal lymph nodes. There are no focal consolidations or pleural effusions. IMPRESSION: No evidence for acute Prior granulomatous disease. Abnormality. Electronically Signed   By: Nolon Nations M.D.   On: 10/21/2015 19:25   Left heart cath  2010 ANGIOGRAPHIC DATA: 1. On plain fluoroscopy there appears to be pretty extensive calcification of the mitral annulus and perhaps part of the leaflet. 2. The left ventriculography in the RAO projection reveals vigorous global systolic function with EF in excess of 65%. No significant mitral regurgitation is noted. 3. The left main is free of critical disease. 4. The LAD courses to the apex and provides one major diagonal branch. The LAD has minor luminal irregularities but no significant high- grade focal abnormalities. 5. The circumflex provides a large marginal and then a second marginal. Prior to the bifurcation of the marginals, there is a focal somewhat eccentric plaque of 40-50% at most. The large marginal, representing OM1 has a diffuse area of 40% luminal irregularity. The distal vessel was intact. The second marginal or AV circumflex is without critical narrowing. 6. The right coronary artery is somewhat smaller caliber vessel with diffuse 30% plaquing in the midportion. It divides distally into a acute marginal/PDA subbranch and a posterolateral system all of which are without critical narrowing.  CONCLUSIONS: 1. Well-preserved left ventricular function with mitral annular calcification and no significant mitral regurgitation. 2. Scattered diffuse plaquing of the circumflex coronary artery that does not appear to be critical. 3. Scattered irregularities as noted      Current Medications:  . allopurinol  300 mg Oral Daily  . diltiazem  240 mg Oral Daily  . lisinopril  40 mg Oral Daily  . metoprolol tartrate  12.5 mg Oral BID  . pantoprazole  80 mg Oral Daily      ASSESSMENT AND PLAN: Principal Problem:   Chest pain Active Problems:   Essential hypertension   Atrial fibrillation (HCC)   Glucose intolerance (impaired glucose tolerance)  1. Chest pain  concerning for ACS: He is known to have moderate CAD by cath in 2010 with 50% mid Circumflex stenosis, 40% RCA stenosis and mild LAD disease. He has had no chest pain over the last 7 years. He developed chest pain 4 days ago while walking. He did not strain his chest wall with lifting. This was severe chest pressure with radiation to his jaw and both arms. He has continued to have episodes of chest tightness associated with dyspnea since then.   He has risk factors including obesity, HLD, and family history. Will plan for left heart cath when INR < 2. Troponin neg x 3.   2. Chronic atrial fibrillation: rate controlled on 240mg  diltiazem. Continue same. On Warfarin. INR is 2.25 today, see discussion above.   This patients CHA2DS2-VASc Score and unadjusted  Ischemic Stroke Rate (% per year) is equal to 3.2 % stroke rate/year from a score of 3 Above score calculated as 1 point each if present [CHF, HTN, DM, Vascular=MI/PAD/Aortic Plaque, Age if 65-74, or Male], 2 points each if present [Age > 75, or Stroke/TIA/TE]  3. HTN: Well controlled on beta blocker and ACE-I, continue same. Creatinine is stable.   4. HLD: Not on statin. Would repeat lipid panel in the am and likely add high intensity statin   Signed, Arbutus Leas , NP 7:48 AM 10/23/2015 Pager [phone]  I have personally seen and examined this patient with Jettie Booze, NP. I agree with the assessment and plan as outlined above. He is admitted with unstable angina. Troponin negative. Planning for cardiac cath when INR less than 2. Radial approach. No chest pain this am.. Vitals and labs reviewed. Exam with well developed male, NAD, CV irrreg irreg, Lungs clear, trace LE edema bilat. Repeat INR in am. Possible cath tomorrow pending INR.  Lauree Chandler 10/23/2015 10:41 AM

## 2015-10-23 NOTE — Progress Notes (Addendum)
Patient ID: [name], male   DOB: [birth year], 82 y.o.   MRN: [id number]                                                                PROGRESS NOTE                                                                                                                                                                                                             Patient Demographics:    [name], is a 82 y.o. male, DOB - [birth year], [id number]  Admit date - 10/21/2015   Admitting Physician Etta Quill, DO  Outpatient Primary MD for the patient is Elsie Stain, MD  LOS - 0  Outpatient [place]  Chief Complaint  Patient presents with  . Chest Pain  . Shortness of Breath       Brief Narrative  82 y.o.malewith medical history significant of A.Fib on rate control meds and coumadin, HTN, non-obstructive CAD on cath in 2010. Patient presents to the ED with c/o chest pain. Symptoms onset 3 days ago, have been intermittent with pain radiating down both arms. Resolve spontaneously and do not appear to be associated with exertion. Has been having several week history of worsening exertional dyspnea. No fevers nor chills. No association with food. Pt seen by cardiology, anticipating cardiac catheterization once INR <2   Subjective:    [name] today has, No headache, No chest pain, No abdominal pain - No Nausea, No new weakness tingling or numbness, No Cough - SOB.    Assessment  & Plan :    Principal Problem:   Chest pain Active Problems:   Essential hypertension   Atrial fibrillation (HCC)   Glucose intolerance (impaired glucose tolerance)   Chest pain Cont tele Trop negative x3.  INR still over 2 today.  Anticipate that INR will be below 2 tomorrow and that cardiology will then be able to proceed with cardiac catheterization.  Awaiting cardiology input   Pafib Cont cardizem, metoprolol Coumadin on hold  Glucose intolerance  (hga1c=6.1)      Code Status : FULL CODE  Family Communication  :   Disposition Plan  : home   Barriers For Discharge :   Consults  :  cardiology  Procedures  :  DVT Prophylaxis  :  SCD   Lab Results  Component Value Date   PLT 163 10/23/2015    Antibiotics  :    Anti-infectives    None        Objective:   Vitals:   10/22/15 1324 10/22/15 2036 10/22/15 2254 10/23/15 0413  BP: (!) 144/74 (!) 149/77  117/70  Pulse: 85 (!) 101 92 86  Resp: 18 18 18 18   Temp: 98 F (36.7 C) 98.1 F (36.7 C)    TempSrc: Oral Oral    SpO2: 99% 97% 97% 98%  Weight:      Height:        Wt Readings from Last 3 Encounters:  10/21/15 96.5 kg (212 lb 11.2 oz)  10/17/15 94.5 kg (208 lb 6.4 oz)  05/24/15 98.4 kg (217 lb)     Intake/Output Summary (Last 24 hours) at 10/23/15 0738 Last data filed at 10/22/15 2036  Gross per 24 hour  Intake              720 ml  Output                0 ml  Net              720 ml     Physical Exam  Awake Alert, Oriented X 3, No new F.N deficits, Normal affect .AT,PERRAL Supple Neck,No JVD, No cervical lymphadenopathy appriciated.  Symmetrical Chest wall movement, Good air movement bilaterally, CTAB Irr, Irr, s1, s2,No Gallops,Rubs or new Murmurs, No Parasternal Heave +ve B.Sounds, Abd Soft, No tenderness, No organomegaly appriciated, No rebound - guarding or rigidity. No Cyanosis, Clubbing or edema, No new Rash or bruise      Data Review:    CBC  Recent Labs Lab 10/21/15 1809 10/22/15 0119 10/23/15 0339  WBC 5.9 5.6 5.7  HGB 15.8 15.1 16.0  HCT 46.5 44.9 47.7  PLT 166 153 163  MCV 88.6 89.1 87.8  MCH 30.1 30.0 29.5  MCHC 34.0 33.6 33.5  RDW 14.6 14.5 14.3    Chemistries   Recent Labs Lab 10/21/15 1809 10/23/15 0339  NA 136 137  K 4.6 4.1  CL 104 101  CO2 23 27  GLUCOSE 149* 117*  BUN 15 15  CREATININE 1.28* 1.21  CALCIUM 9.0 9.2  AST  --  25  ALT  --  29  ALKPHOS  --  70  BILITOT  --  0.5    ------------------------------------------------------------------------------------------------------------------ No results for input(s): CHOL, HDL, LDLCALC, TRIG, CHOLHDL, LDLDIRECT in the last 72 hours.  Lab Results  Component Value Date   HGBA1C  07/31/2008    6.1 (NOTE) The ADA recommends the following therapeutic goal for glycemic control related to Hgb A1c measurement: Goal of therapy: <6.5 Hgb A1c  Reference: American Diabetes Association: Clinical Practice Recommendations 2010, Diabetes Care, 2010, 33: (Suppl  1).   ------------------------------------------------------------------------------------------------------------------ No results for input(s): TSH, T4TOTAL, T3FREE, THYROIDAB in the last 72 hours.  Invalid input(s): FREET3 ------------------------------------------------------------------------------------------------------------------ No results for input(s): VITAMINB12, FOLATE, FERRITIN, TIBC, IRON, RETICCTPCT in the last 72 hours.  Coagulation profile  Recent Labs Lab 10/21/15 2128 10/22/15 0119 10/23/15 0339  INR 3.00 3.12 2.25    No results for input(s): DDIMER in the last 72 hours.  Cardiac Enzymes  Recent Labs Lab 10/21/15 2224 10/22/15 0119 10/22/15 0322  TROPONINI <0.03 <0.03 <0.03   ------------------------------------------------------------------------------------------------------------------ No results found for: BNP  Inpatient Medications  Scheduled Meds: . allopurinol  300 mg Oral Daily  .  diltiazem  240 mg Oral Daily  . lisinopril  40 mg Oral Daily  . metoprolol tartrate  12.5 mg Oral BID  . pantoprazole  80 mg Oral Daily   Continuous Infusions:  PRN Meds:.acetaminophen, colchicine, fluticasone, ondansetron (ZOFRAN) IV  Micro Results No results found for this or any previous visit (from the past 240 hour(s)).  Radiology Reports Dg Chest 2 View  Result Date: 10/21/2015 CLINICAL DATA:  Chest pain and shortness of breath  since Friday. Pain radiates through bilateral arms. EXAM: CHEST  2 VIEW COMPARISON:  09/22/2009 FINDINGS: Heart size is normal. Small calcified pulmonary nodules are consistent with prior granulomatous disease. Calcifications are identified within the mediastinal lymph nodes. There are no focal consolidations or pleural effusions. IMPRESSION: No evidence for acute Prior granulomatous disease. Abnormality. Electronically Signed   By: Nolon Nations M.D.   On: 10/21/2015 19:25    Time Spent in minutes  30   Jani Gravel M.D on 10/23/2015 at 7:38 AM  Between 7am to 7pm - Pager - [phone]  After 7pm go to [email or web address] - password [facility]  [facility] -  Office  [phone]

## 2015-10-23 NOTE — Progress Notes (Signed)
Per Danae Chen from [place], patient had three pauses, 2.08 at 23:07, 2.12 23:23 and 2.25 at 23:26. Patient was laying in bed with his CPAP on. Daughter states that patient was trying to get comfortable in the bed. Pt states he was feeling fine.Fixed his lead, will continue to monitor.

## 2015-10-23 NOTE — Progress Notes (Signed)
Patient sitting up in chair, daughter present. No needs at this time, call light within reach.

## 2015-10-24 ENCOUNTER — Encounter (HOSPITAL_COMMUNITY)
Admission: EM | Disposition: A | Discharge status: Home / Self Care | Payer: Self-pay | Source: Home / Self Care | Attending: Internal Medicine

## 2015-10-24 DIAGNOSIS — R079 Chest pain, unspecified: Secondary | ICD-10-CM | POA: Diagnosis not present

## 2015-10-24 DIAGNOSIS — I482 Chronic atrial fibrillation: Secondary | ICD-10-CM | POA: Diagnosis not present

## 2015-10-24 DIAGNOSIS — R7302 Impaired glucose tolerance (oral): Secondary | ICD-10-CM | POA: Diagnosis present

## 2015-10-24 DIAGNOSIS — N289 Disorder of kidney and ureter, unspecified: Secondary | ICD-10-CM | POA: Diagnosis present

## 2015-10-24 DIAGNOSIS — G4733 Obstructive sleep apnea (adult) (pediatric): Secondary | ICD-10-CM | POA: Diagnosis not present

## 2015-10-24 DIAGNOSIS — Z6834 Body mass index (BMI) 34.0-34.9, adult: Secondary | ICD-10-CM | POA: Diagnosis not present

## 2015-10-24 DIAGNOSIS — E785 Hyperlipidemia, unspecified: Secondary | ICD-10-CM | POA: Diagnosis not present

## 2015-10-24 DIAGNOSIS — Z7951 Long term (current) use of inhaled steroids: Secondary | ICD-10-CM | POA: Diagnosis not present

## 2015-10-24 DIAGNOSIS — E781 Pure hyperglyceridemia: Secondary | ICD-10-CM | POA: Diagnosis present

## 2015-10-24 DIAGNOSIS — M109 Gout, unspecified: Secondary | ICD-10-CM | POA: Diagnosis not present

## 2015-10-24 DIAGNOSIS — R001 Bradycardia, unspecified: Secondary | ICD-10-CM | POA: Diagnosis present

## 2015-10-24 DIAGNOSIS — I129 Hypertensive chronic kidney disease with stage 1 through stage 4 chronic kidney disease, or unspecified chronic kidney disease: Secondary | ICD-10-CM | POA: Diagnosis not present

## 2015-10-24 DIAGNOSIS — Z87891 Personal history of nicotine dependence: Secondary | ICD-10-CM | POA: Diagnosis not present

## 2015-10-24 DIAGNOSIS — I200 Unstable angina: Secondary | ICD-10-CM | POA: Diagnosis not present

## 2015-10-24 DIAGNOSIS — Z7901 Long term (current) use of anticoagulants: Secondary | ICD-10-CM | POA: Diagnosis not present

## 2015-10-24 DIAGNOSIS — I481 Persistent atrial fibrillation: Secondary | ICD-10-CM | POA: Diagnosis not present

## 2015-10-24 DIAGNOSIS — N183 Chronic kidney disease, stage 3 (moderate): Secondary | ICD-10-CM | POA: Diagnosis not present

## 2015-10-24 DIAGNOSIS — I25110 Atherosclerotic heart disease of native coronary artery with unstable angina pectoris: Secondary | ICD-10-CM | POA: Diagnosis not present

## 2015-10-24 DIAGNOSIS — E669 Obesity, unspecified: Secondary | ICD-10-CM | POA: Diagnosis not present

## 2015-10-24 HISTORY — PX: CARDIAC CATHETERIZATION: SHX172

## 2015-10-24 LAB — COMPREHENSIVE METABOLIC PANEL
ALBUMIN: 3.6 g/dL (ref 3.5–5.0)
ALK PHOS: 74 U/L (ref 38–126)
ALT: 24 U/L (ref 17–63)
ANION GAP: 10 (ref 5–15)
AST: 23 U/L (ref 15–41)
BUN: 23 mg/dL — AB (ref 6–20)
CALCIUM: 9 mg/dL (ref 8.9–10.3)
CO2: 26 mmol/L (ref 22–32)
Chloride: 99 mmol/L — ABNORMAL LOW (ref 101–111)
Creatinine, Ser: 1.66 mg/dL — ABNORMAL HIGH (ref 0.61–1.24)
GFR calc Af Amer: 43 mL/min — ABNORMAL LOW (ref >60)
GFR calc non Af Amer: 37 mL/min — ABNORMAL LOW (ref >60)
GLUCOSE: 137 mg/dL — AB (ref 65–99)
POTASSIUM: 3.9 mmol/L (ref 3.5–5.1)
SODIUM: 135 mmol/L (ref 135–145)
Total Bilirubin: 0.5 mg/dL (ref 0.3–1.2)
Total Protein: 6.4 g/dL — ABNORMAL LOW (ref 6.5–8.1)

## 2015-10-24 LAB — CBC
HEMATOCRIT: 47.3 % (ref 39.0–52.0)
HEMOGLOBIN: 15.7 g/dL (ref 13.0–17.0)
MCH: 29.8 pg (ref 26.0–34.0)
MCHC: 33.2 g/dL (ref 30.0–36.0)
MCV: 89.8 fL (ref 78.0–100.0)
Platelets: 159 10*3/uL (ref 150–400)
RBC: 5.27 MIL/uL (ref 4.22–5.81)
RDW: 14.5 % (ref 11.5–15.5)
WBC: 6.5 10*3/uL (ref 4.0–10.5)

## 2015-10-24 LAB — LIPID PANEL
CHOL/HDL RATIO: 7.7 ratio
Cholesterol: 176 mg/dL (ref 0–200)
HDL: 23 mg/dL — AB (ref >40)
LDL CALC: 82 mg/dL (ref 0–99)
Triglycerides: 357 mg/dL — ABNORMAL HIGH (ref <150)
VLDL: 71 mg/dL — ABNORMAL HIGH (ref 0–40)

## 2015-10-24 LAB — POCT ACTIVATED CLOTTING TIME: ACTIVATED CLOTTING TIME: 290 s

## 2015-10-24 LAB — PROTIME-INR
INR: 1.73
Prothrombin Time: 20.5 seconds — ABNORMAL HIGH (ref 11.4–15.2)

## 2015-10-24 SURGERY — LEFT HEART CATH AND CORONARY ANGIOGRAPHY

## 2015-10-24 MED ORDER — SODIUM CHLORIDE 0.9% FLUSH
3.0000 mL | Freq: Two times a day (BID) | INTRAVENOUS | Status: DC
  Administered 2015-10-24: 22:00:00 3 mL via INTRAVENOUS

## 2015-10-24 MED ORDER — SODIUM CHLORIDE 0.9% FLUSH: 3.0000 mL | INTRAVENOUS | Status: DC | PRN

## 2015-10-24 MED ORDER — SODIUM CHLORIDE 0.9 % IV SOLN
INTRAVENOUS | Status: DC
  Administered 2015-10-24: 08:00:00 via INTRAVENOUS

## 2015-10-24 MED ORDER — FENTANYL CITRATE (PF) 100 MCG/2ML IJ SOLN
INTRAMUSCULAR | Status: DC | PRN
  Administered 2015-10-24: 50 ug via INTRAVENOUS

## 2015-10-24 MED ORDER — ANGIOPLASTY BOOK
Freq: Once | Status: AC
  Administered 2015-10-24: 20:00:00
  Filled 2015-10-24: qty 1

## 2015-10-24 MED ORDER — IOPAMIDOL (ISOVUE-370) INJECTION 76%
INTRAVENOUS | Status: AC
  Filled 2015-10-24: qty 50

## 2015-10-24 MED ORDER — FENTANYL CITRATE (PF) 100 MCG/2ML IJ SOLN
INTRAMUSCULAR | Status: AC
  Filled 2015-10-24: qty 2

## 2015-10-24 MED ORDER — NITROGLYCERIN 1 MG/10 ML FOR IR/CATH LAB
INTRA_ARTERIAL | Status: DC | PRN
  Administered 2015-10-24: 200 ug via INTRACORONARY

## 2015-10-24 MED ORDER — TICAGRELOR 90 MG PO TABS
ORAL_TABLET | ORAL | Status: DC | PRN
  Administered 2015-10-24: 180 mg via ORAL

## 2015-10-24 MED ORDER — LIDOCAINE HCL (PF) 1 % IJ SOLN
INTRAMUSCULAR | Status: AC
  Filled 2015-10-24: qty 30

## 2015-10-24 MED ORDER — VERAPAMIL HCL 2.5 MG/ML IV SOLN
INTRAVENOUS | Status: DC | PRN
  Administered 2015-10-24: 10 mL via INTRA_ARTERIAL

## 2015-10-24 MED ORDER — SODIUM CHLORIDE 0.9 % IV SOLN: 250.0000 mL | INTRAVENOUS | Status: DC | PRN

## 2015-10-24 MED ORDER — VERAPAMIL HCL 2.5 MG/ML IV SOLN
INTRAVENOUS | Status: AC
  Filled 2015-10-24: qty 2

## 2015-10-24 MED ORDER — IOPAMIDOL (ISOVUE-370) INJECTION 76%
INTRAVENOUS | Status: AC
  Filled 2015-10-24: qty 100

## 2015-10-24 MED ORDER — HEPARIN (PORCINE) IN NACL 2-0.9 UNIT/ML-% IJ SOLN
INTRAMUSCULAR | Status: AC
  Filled 2015-10-24: qty 1000

## 2015-10-24 MED ORDER — NITROGLYCERIN 1 MG/10 ML FOR IR/CATH LAB
INTRA_ARTERIAL | Status: AC
  Filled 2015-10-24: qty 10

## 2015-10-24 MED ORDER — TICAGRELOR 90 MG PO TABS
ORAL_TABLET | ORAL | Status: AC
  Filled 2015-10-24: qty 2

## 2015-10-24 MED ORDER — SODIUM CHLORIDE 0.9 % IV SOLN
INTRAVENOUS | Status: AC
  Administered 2015-10-24: 19:00:00 via INTRAVENOUS

## 2015-10-24 MED ORDER — LIDOCAINE HCL (PF) 1 % IJ SOLN
INTRAMUSCULAR | Status: DC | PRN
  Administered 2015-10-24: 2 mL

## 2015-10-24 MED ORDER — HEPARIN (PORCINE) IN NACL 2-0.9 UNIT/ML-% IJ SOLN
INTRAMUSCULAR | Status: DC | PRN
  Administered 2015-10-24: 1000 mL

## 2015-10-24 MED ORDER — OMEGA-3-ACID ETHYL ESTERS 1 G PO CAPS
1.0000 g | ORAL_CAPSULE | Freq: Two times a day (BID) | ORAL | Status: DC
  Administered 2015-10-24 – 2015-10-25 (×2): 1 g via ORAL
  Filled 2015-10-24 (×3): qty 1

## 2015-10-24 MED ORDER — TICAGRELOR 90 MG PO TABS
90.0000 mg | ORAL_TABLET | Freq: Two times a day (BID) | ORAL | Status: DC
  Administered 2015-10-25: 90 mg via ORAL
  Filled 2015-10-24: qty 1

## 2015-10-24 MED ORDER — HEPARIN SODIUM (PORCINE) 1000 UNIT/ML IJ SOLN
INTRAMUSCULAR | Status: AC
  Filled 2015-10-24: qty 1

## 2015-10-24 MED ORDER — MIDAZOLAM HCL 2 MG/2ML IJ SOLN
INTRAMUSCULAR | Status: AC
  Filled 2015-10-24: qty 2

## 2015-10-24 MED ORDER — ASPIRIN 81 MG PO CHEW
81.0000 mg | CHEWABLE_TABLET | Freq: Every day | ORAL | Status: DC
  Administered 2015-10-25: 10:00:00 81 mg via ORAL
  Filled 2015-10-24: qty 1

## 2015-10-24 MED ORDER — MIDAZOLAM HCL 2 MG/2ML IJ SOLN
INTRAMUSCULAR | Status: DC | PRN
  Administered 2015-10-24: 1 mg via INTRAVENOUS

## 2015-10-24 MED ORDER — HEPARIN SODIUM (PORCINE) 1000 UNIT/ML IJ SOLN
INTRAMUSCULAR | Status: DC | PRN
  Administered 2015-10-24: 5000 [IU] via INTRAVENOUS
  Administered 2015-10-24: 3000 [IU] via INTRAVENOUS
  Administered 2015-10-24: 2000 [IU] via INTRAVENOUS

## 2015-10-24 SURGICAL SUPPLY — 14 items
BALLN EMERGE MR 2.0X12 (BALLOONS) ×2
BALLOON EMERGE MR 2.0X12 (BALLOONS) ×1 IMPLANT
CATH INFINITI 5 FR JL3.5 (CATHETERS) ×2 IMPLANT
CATH INFINITI JR4 5F (CATHETERS) ×2 IMPLANT
CATH VISTA GUIDE 6FR XB3 (CATHETERS) ×2 IMPLANT
DEVICE RAD COMP TR BAND LRG (VASCULAR PRODUCTS) ×2 IMPLANT
GLIDESHEATH SLEND SS 6F .021 (SHEATH) ×2 IMPLANT
KIT ENCORE 26 ADVANTAGE (KITS) ×2 IMPLANT
KIT HEART LEFT (KITS) ×2 IMPLANT
PACK CARDIAC CATHETERIZATION (CUSTOM PROCEDURE TRAY) ×2 IMPLANT
TRANSDUCER W/STOPCOCK (MISCELLANEOUS) ×2 IMPLANT
TUBING CIL FLEX 10 FLL-RA (TUBING) ×2 IMPLANT
WIRE RUNTHROUGH .014X180CM (WIRE) ×2 IMPLANT
WIRE SAFE-T 1.5MM-J .035X260CM (WIRE) ×2 IMPLANT

## 2015-10-24 NOTE — Progress Notes (Signed)
     SUBJECTIVE: No chest pain this am.   BP 95/65 (BP Location: Right Arm)   Pulse 78   Temp 98.2 F (36.8 C) (Oral)   Resp 18   Ht 5\' 6"  (1.676 m)   Wt 212 lb 11.2 oz (96.5 kg)   SpO2 97%   BMI 34.33 kg/m  No intake or output data in the 24 hours ending 10/24/15 0815  PHYSICAL EXAM General: Well developed, well nourished, in no acute distress. Alert and oriented x 3.  Psych:  Good affect, responds appropriately Neck: No JVD. No masses noted.  Lungs: Clear bilaterally with no wheezes or rhonci noted.  Heart: Irreg irreg with no murmurs noted. Abdomen: Bowel sounds are present. Soft, non-tender.  Extremities: No lower extremity edema.   LABS: Basic Metabolic Panel:  Recent Labs  10/23/15 0339 10/24/15 0430  NA 137 135  K 4.1 3.9  CL 101 99*  CO2 27 26  GLUCOSE 117* 137*  BUN 15 23*  CREATININE 1.21 1.66*  CALCIUM 9.2 9.0   CBC:  Recent Labs  10/23/15 0339 10/24/15 0430  WBC 5.7 6.5  HGB 16.0 15.7  HCT 47.7 47.3  MCV 87.8 89.8  PLT 163 159   Cardiac Enzymes:  Recent Labs  10/21/15 2224 10/22/15 0119 10/22/15 0322  TROPONINI <0.03 <0.03 <0.03   Fasting Lipid Panel:  Recent Labs  10/24/15 0430  CHOL 176  HDL 23*  LDLCALC 82  TRIG 357*  CHOLHDL 7.7    Current Meds: . allopurinol  300 mg Oral Daily  . atorvastatin  80 mg Oral q1800  . diltiazem  240 mg Oral Daily  . lisinopril  40 mg Oral Daily  . metoprolol tartrate  12.5 mg Oral BID  . omega-3 acid ethyl esters  1 g Oral BID  . pantoprazole  80 mg Oral Daily  . sodium chloride flush  3 mL Intravenous Q12H     ASSESSMENT AND PLAN:  1. CAD with unstable angina: He is known to have moderate CAD by cath in 2010 with 50% mid Circumflex stenosis, 40% RCA stenosis and mild LAD disease. Admitted with chest pain worrisome for unstable angina. Plan cardiac cath today. INR is 1.7. Begin hydration pre-cath with slight worsening of renal function (baseline creatinine around 1.4-1.5).  2.  Chronic atrial fibrillation: rate controlled on 240mg  diltiazem. Coumadin on hold for cath.  3. HTN: Well controlled on beta blocker and ACE-I. Hold Ace-inh today given renal function and plans for cath.    4. HLD: He is on a statin    Lauree Chandler  8/10/20178:15 AM

## 2015-10-24 NOTE — H&P (View-Only) (Signed)
     SUBJECTIVE: No chest pain this am.   BP 95/65 (BP Location: Right Arm)   Pulse 78   Temp 98.2 F (36.8 C) (Oral)   Resp 18   Ht 5\' 6"  (1.676 m)   Wt 212 lb 11.2 oz (96.5 kg)   SpO2 97%   BMI 34.33 kg/m  No intake or output data in the 24 hours ending 10/24/15 0815  PHYSICAL EXAM General: Well developed, well nourished, in no acute distress. Alert and oriented x 3.  Psych:  Good affect, responds appropriately Neck: No JVD. No masses noted.  Lungs: Clear bilaterally with no wheezes or rhonci noted.  Heart: Irreg irreg with no murmurs noted. Abdomen: Bowel sounds are present. Soft, non-tender.  Extremities: No lower extremity edema.   LABS: Basic Metabolic Panel:  Recent Labs  10/23/15 0339 10/24/15 0430  NA 137 135  K 4.1 3.9  CL 101 99*  CO2 27 26  GLUCOSE 117* 137*  BUN 15 23*  CREATININE 1.21 1.66*  CALCIUM 9.2 9.0   CBC:  Recent Labs  10/23/15 0339 10/24/15 0430  WBC 5.7 6.5  HGB 16.0 15.7  HCT 47.7 47.3  MCV 87.8 89.8  PLT 163 159   Cardiac Enzymes:  Recent Labs  10/21/15 2224 10/22/15 0119 10/22/15 0322  TROPONINI <0.03 <0.03 <0.03   Fasting Lipid Panel:  Recent Labs  10/24/15 0430  CHOL 176  HDL 23*  LDLCALC 82  TRIG 357*  CHOLHDL 7.7    Current Meds: . allopurinol  300 mg Oral Daily  . atorvastatin  80 mg Oral q1800  . diltiazem  240 mg Oral Daily  . lisinopril  40 mg Oral Daily  . metoprolol tartrate  12.5 mg Oral BID  . omega-3 acid ethyl esters  1 g Oral BID  . pantoprazole  80 mg Oral Daily  . sodium chloride flush  3 mL Intravenous Q12H     ASSESSMENT AND PLAN:  1. CAD with unstable angina: He is known to have moderate CAD by cath in 2010 with 50% mid Circumflex stenosis, 40% RCA stenosis and mild LAD disease. Admitted with chest pain worrisome for unstable angina. Plan cardiac cath today. INR is 1.7. Begin hydration pre-cath with slight worsening of renal function (baseline creatinine around 1.4-1.5).  2.  Chronic atrial fibrillation: rate controlled on 240mg  diltiazem. Coumadin on hold for cath.  3. HTN: Well controlled on beta blocker and ACE-I. Hold Ace-inh today given renal function and plans for cath.    4. HLD: He is on a statin    Lauree Chandler  8/10/20178:15 AM

## 2015-10-24 NOTE — Care Management Note (Signed)
Case Management Note  Patient Details  Name: [name] MRN: [id number] Date of Birth: Jul 06, 1930  Subjective/Objective:     Patient is from home, s/p Coronary Balloon Angioplasty, NCM will cont to follow for dc needs.                Action/Plan:   Expected Discharge Date:                  Expected Discharge Plan:  [place]  In-House Referral:     Discharge planning Services  CM Consult  Post Acute Care Choice:    Choice offered to:     DME Arranged:    DME Agency:     HH Arranged:    [place] Agency:     Status of Service:  In process, will continue to follow  If discussed at Long Length of Stay Meetings, dates discussed:    Additional Comments:  Zenon Mayo, RN 10/24/2015, 8:39 PM

## 2015-10-24 NOTE — Interval H&P Note (Signed)
Cath Lab Visit (complete for each Cath Lab visit)  Clinical Evaluation Leading to the Procedure:   ACS: No.  Non-ACS:    Anginal Classification: CCS [name]  Anti-ischemic medical therapy: Maximal Therapy (2 or more classes of medications)  Non-Invasive Test Results: No non-invasive testing performed  Prior CABG: No previous CABG      History and Physical Interval Note:  10/24/2015 3:45 PM  [name]  has presented today for surgery, with the diagnosis of unstable angina  The various methods of treatment have been discussed with the patient and family. After consideration of risks, benefits and other options for treatment, the patient has consented to  Procedure(s): Left Heart Cath and Coronary Angiography (N/A) as a surgical intervention .  The patient's history has been reviewed, patient examined, no change in status, stable for surgery.  I have reviewed the patient's chart and labs.  Questions were answered to the patient's satisfaction.     [name]

## 2015-10-24 NOTE — Progress Notes (Signed)
Pt. found on home CPAP, wife @ bedside, tolerating well on room air.

## 2015-10-24 NOTE — Progress Notes (Signed)
Patient ID: NYXON SHERE, male   DOB: 07-27-1930, 82 y.o.   MRN: [id number]                                                                PROGRESS NOTE                                                                                                                                                                                                             Patient Demographics:    [name], is a 82 y.o. male, DOB - 05-07-1930, [id number]  Admit date - 10/21/2015   Admitting Physician Etta Quill, DO  Outpatient Primary MD for the patient is Elsie Stain, MD  LOS - 0  Outpatient Specialists: Sherren Mocha  Chief Complaint  Patient presents with  . Chest Pain  . Shortness of Breath       Brief Narrative  82 y.o.malewith medical history significant of A.Fib on rate control meds and coumadin, HTN, non-obstructive CAD on cath in 2010. Patient presents to the ED with c/o chest pain. Symptoms onset 3 days ago, have been intermittent with pain radiating down both arms. Resolve spontaneously and do not appear to be associated with exertion. Has been having several week history of worsening exertional dyspnea. No fevers nor chills. No association with food. Pt seen by cardiology, anticipating cardiac catheterization once INR <2   Subjective:    [name] today has been awaiting cath.  No headache, No chest pain, No abdominal pain - No Nausea, No new weakness tingling or numbness, No Cough - SOB.   Assessment  & Plan :    Principal Problem:   Chest pain Active Problems:   Essential hypertension   Atrial fibrillation (HCC)   Glucose intolerance (impaired glucose tolerance)   Chest pain Cont tele Trop negative x3.  INR <2 today Anticipate that INR will be below 2 tomorrow and that cardiology will then be able to proceed with cardiac catheterization.    Mild renal insufficiency Start normal saline 167mL per hour x 18 hours.  Hold lisinopril  today.  Pafib Cont cardizem, metoprolol Coumadin on hold  Hypertriglyceridemia Add Lovaza 1gm po bid  Glucose intolerance (hga1c=6.1)    Code Status:FULL CODE  Family Communication :  Disposition Plan:home   Barriers For Discharge:  Consults :cardiology  Procedures :   DVT Prophylaxis: SCD     Lab Results  Component Value Date   PLT 159 10/24/2015    Antibiotics  :     Anti-infectives    None        Objective:   Vitals:   10/22/15 2254 10/23/15 0413 10/23/15 2022 10/24/15 0517  BP:  117/70 111/78 95/65  Pulse: 92 86 87 78  Resp: 18 18 18 18   Temp:   97.8 F (36.6 C) 98.2 F (36.8 C)  TempSrc:   Oral Oral  SpO2: 97% 98% 97% 97%  Weight:      Height:        Wt Readings from Last 3 Encounters:  10/21/15 96.5 kg (212 lb 11.2 oz)  10/17/15 94.5 kg (208 lb 6.4 oz)  05/24/15 98.4 kg (217 lb)     Intake/Output Summary (Last 24 hours) at 10/24/15 0720 Last data filed at 10/23/15 0800  Gross per 24 hour  Intake              240 ml  Output                0 ml  Net              240 ml     Physical Exam  Awake Alert, Oriented X 3, No new F.N deficits, Normal affect [place].AT,PERRAL Supple Neck,No JVD, No cervical lymphadenopathy appriciated.  Symmetrical Chest wall movement, Good air movement bilaterally, CTAB RRR,No Gallops,Rubs or new Murmurs, No Parasternal Heave +ve B.Sounds, Abd Soft, No tenderness, No organomegaly appriciated, No rebound - guarding or rigidity. No Cyanosis, Clubbing or edema, No new Rash or bruise      Data Review:    CBC  Recent Labs Lab 10/21/15 1809 10/22/15 0119 10/23/15 0339 10/24/15 0430  WBC 5.9 5.6 5.7 6.5  HGB 15.8 15.1 16.0 15.7  HCT 46.5 44.9 47.7 47.3  PLT 166 153 163 159  MCV 88.6 89.1 87.8 89.8  MCH 30.1 30.0 29.5 29.8  MCHC 34.0 33.6 33.5 33.2  RDW 14.6 14.5 14.3 14.5    Chemistries   Recent Labs Lab 10/21/15 1809 10/23/15 0339 10/24/15 0430  NA 136 137 135   K 4.6 4.1 3.9  CL 104 101 99*  CO2 23 27 26   GLUCOSE 149* 117* 137*  BUN 15 15 23*  CREATININE 1.28* 1.21 1.66*  CALCIUM 9.0 9.2 9.0  AST  --  25 23  ALT  --  29 24  ALKPHOS  --  70 74  BILITOT  --  0.5 0.5   ------------------------------------------------------------------------------------------------------------------  Recent Labs  10/24/15 0430  CHOL 176  HDL 23*  LDLCALC 82  TRIG 357*  CHOLHDL 7.7    Lab Results  Component Value Date   HGBA1C  07/31/2008    6.1 (NOTE) The ADA recommends the following therapeutic goal for glycemic control related to Hgb A1c measurement: Goal of therapy: <6.5 Hgb A1c  Reference: American Diabetes Association: Clinical Practice Recommendations 2010, Diabetes Care, 2010, 33: (Suppl  1).   ------------------------------------------------------------------------------------------------------------------ No results for input(s): TSH, T4TOTAL, T3FREE, THYROIDAB in the last 72 hours.  Invalid input(s): FREET3 ------------------------------------------------------------------------------------------------------------------ No results for input(s): VITAMINB12, FOLATE, FERRITIN, TIBC, IRON, RETICCTPCT in the last 72 hours.  Coagulation profile  Recent Labs Lab 10/21/15 2128 10/22/15 0119 10/23/15 0339 10/24/15 0430  INR 3.00 3.12 2.25 1.73    No results for input(s): DDIMER in the last 72 hours.  Cardiac Enzymes  Recent Labs Lab 10/21/15 2224 10/22/15 0119 10/22/15 0322  TROPONINI <0.03 <0.03 <0.03   ------------------------------------------------------------------------------------------------------------------ No results found for: BNP  Inpatient Medications  Scheduled Meds: . allopurinol  300 mg Oral Daily  . atorvastatin  80 mg Oral q1800  . diltiazem  240 mg Oral Daily  . lisinopril  40 mg Oral Daily  . metoprolol tartrate  12.5 mg Oral BID  . pantoprazole  80 mg Oral Daily  . sodium chloride flush  3 mL  Intravenous Q12H   Continuous Infusions: . sodium chloride 100 mL/hr at 10/24/15 0459  . sodium chloride     PRN Meds:.sodium chloride, acetaminophen, colchicine, fluticasone, ondansetron (ZOFRAN) IV, sodium chloride flush  Micro Results No results found for this or any previous visit (from the past 240 hour(s)).  Radiology Reports Dg Chest 2 View  Result Date: 10/21/2015 CLINICAL DATA:  Chest pain and shortness of breath since Friday. Pain radiates through bilateral arms. EXAM: CHEST  2 VIEW COMPARISON:  09/22/2009 FINDINGS: Heart size is normal. Small calcified pulmonary nodules are consistent with prior granulomatous disease. Calcifications are identified within the mediastinal lymph nodes. There are no focal consolidations or pleural effusions. IMPRESSION: No evidence for acute Prior granulomatous disease. Abnormality. Electronically Signed   By: Nolon Nations M.D.   On: 10/21/2015 19:25    Time Spent in minutes  30   Jani Gravel M.D on 10/24/2015 at 7:20 AM  Between 7am to 7pm - Pager - [phone]  After 7pm go to [email or web address] - password [facility]  [facility] -  Office  [phone]

## 2015-10-25 ENCOUNTER — Encounter (HOSPITAL_COMMUNITY): Payer: Self-pay | Admitting: Interventional Cardiology

## 2015-10-25 DIAGNOSIS — N183 Chronic kidney disease, stage 3 unspecified: Secondary | ICD-10-CM | POA: Diagnosis present

## 2015-10-25 DIAGNOSIS — E785 Hyperlipidemia, unspecified: Secondary | ICD-10-CM | POA: Diagnosis present

## 2015-10-25 DIAGNOSIS — Z7901 Long term (current) use of anticoagulants: Secondary | ICD-10-CM

## 2015-10-25 DIAGNOSIS — R001 Bradycardia, unspecified: Secondary | ICD-10-CM | POA: Diagnosis present

## 2015-10-25 DIAGNOSIS — I200 Unstable angina: Secondary | ICD-10-CM

## 2015-10-25 DIAGNOSIS — I10 Essential (primary) hypertension: Secondary | ICD-10-CM | POA: Diagnosis present

## 2015-10-25 DIAGNOSIS — I4819 Other persistent atrial fibrillation: Secondary | ICD-10-CM | POA: Diagnosis present

## 2015-10-25 LAB — BASIC METABOLIC PANEL
Anion gap: 10 (ref 5–15)
BUN: 20 mg/dL (ref 6–20)
CALCIUM: 9.2 mg/dL (ref 8.9–10.3)
CO2: 25 mmol/L (ref 22–32)
CREATININE: 1.32 mg/dL — AB (ref 0.61–1.24)
Chloride: 99 mmol/L — ABNORMAL LOW (ref 101–111)
GFR calc Af Amer: 56 mL/min — ABNORMAL LOW (ref >60)
GFR, EST NON AFRICAN AMERICAN: 49 mL/min — AB (ref >60)
GLUCOSE: 132 mg/dL — AB (ref 65–99)
Potassium: 4.4 mmol/L (ref 3.5–5.1)
Sodium: 134 mmol/L — ABNORMAL LOW (ref 135–145)

## 2015-10-25 LAB — CBC
HEMATOCRIT: 46.1 % (ref 39.0–52.0)
Hemoglobin: 15.4 g/dL (ref 13.0–17.0)
MCH: 29.7 pg (ref 26.0–34.0)
MCHC: 33.4 g/dL (ref 30.0–36.0)
MCV: 89 fL (ref 78.0–100.0)
PLATELETS: 155 10*3/uL (ref 150–400)
RBC: 5.18 MIL/uL (ref 4.22–5.81)
RDW: 14.4 % (ref 11.5–15.5)
WBC: 6.6 10*3/uL (ref 4.0–10.5)

## 2015-10-25 LAB — PROTIME-INR
INR: 1.32
Prothrombin Time: 16.4 seconds — ABNORMAL HIGH (ref 11.4–15.2)

## 2015-10-25 MED ORDER — ASPIRIN 81 MG PO CHEW: 81.0000 mg | CHEWABLE_TABLET | Freq: Every day | ORAL | 0 refills | Status: DC

## 2015-10-25 MED ORDER — ROSUVASTATIN CALCIUM 10 MG PO TABS: 10.0000 mg | ORAL_TABLET | Freq: Every day | ORAL | Status: DC

## 2015-10-25 MED ORDER — CLOPIDOGREL BISULFATE 75 MG PO TABS
300.0000 mg | ORAL_TABLET | Freq: Once | ORAL | Status: AC
  Administered 2015-10-25: 300 mg via ORAL
  Filled 2015-10-25 (×2): qty 4

## 2015-10-25 MED ORDER — CLOPIDOGREL BISULFATE 75 MG PO TABS
75.0000 mg | ORAL_TABLET | Freq: Every day | ORAL | 0 refills | Status: DC
Start: 2015-10-26 — End: 2015-12-02

## 2015-10-25 MED ORDER — ROSUVASTATIN CALCIUM 20 MG PO TABS: 20.0000 mg | ORAL_TABLET | Freq: Every day | ORAL | Status: DC

## 2015-10-25 MED ORDER — CLOPIDOGREL BISULFATE 75 MG PO TABS: 75.0000 mg | ORAL_TABLET | Freq: Every day | ORAL | Status: DC

## 2015-10-25 MED ORDER — ROSUVASTATIN CALCIUM 10 MG PO TABS
10.0000 mg | ORAL_TABLET | Freq: Every day | ORAL | 0 refills | Status: DC
Start: 2015-10-25 — End: 2015-12-02

## 2015-10-25 NOTE — Discharge Summary (Signed)
Physician Discharge Summary  [name] [id number] DOB: 1930/06/29 DOA: 10/21/2015  PCP: Elsie Stain, MD  Admit date: 10/21/2015 Discharge date: 10/25/2015  Time spent: >35 minutes  Recommendations for Outpatient Follow-up:  Cardiology f/u 8/14 INR to be checked on 8/14 PCP f/u 3-5 days as needed Discharge Diagnoses:  Principal Problem:   Unstable angina ([place]) Active Problems:   HYPERCHOLESTEROLEMIA   Essential hypertension   CAD (coronary artery disease), native coronary artery   OSA on CPAP   Chronic anticoagulation   Glucose intolerance (impaired glucose tolerance)   CKD (chronic kidney disease), stage III   Persistent atrial fibrillation (HCC)   Hypertension   Hyperlipidemia   Bradycardia   Discharge Condition: stable   Diet recommendation: low sodium   Filed Weights   10/21/15 1808 10/21/15 2310 10/25/15 0404  Weight: 93 kg (205 lb) 96.5 kg (212 lb 11.2 oz) 98 kg (216 lb 0.8 oz)    History of present illness:  82 y.o.malewith medical history significant of HTN, Gout, A.Fib on rate control meds and coumadin, HTN, non-obstructive CAD on cath in 2010. Patient presents to the ED with c/o chest pain. Symptoms onset 3 days ago, have been intermittent with pain radiating down both arms. Resolve spontaneously and do not appear to be associated with exertion. Has been having several week history of worsening exertional dyspnea  Hospital Course:  Unstable angina. Underwent LHC and successful balloon angioplasty of OM2 using 2.0 x 12 mm balloon with improvement in stenosis from 100% to 30% with restoration of TIMI-3 flow.  Stent was not placed due to the small vessel size with diffuse disease extending to the ostium. -cardiology recommended to cont ASA+plavix for 1 month, and they will follow up as outpatient   A fib. Cardiology recommended to cont coumadin in addition of dual antiplatelets at this time   Procedures:  [facility] RCA lesion, 15 %stenosed.  Ost 2nd  Mrg to 2nd Mrg lesion, 50 %stenosed.  Mid LAD lesion, 30 %stenosed.  2nd Mrg lesion, 100 %stenosed.  Post intervention, there is a 30% residual stenosis.  LV end diastolic pressure is normal.   1.  Occlusion of small, diffusely diseased OM2 branch. 2.  Mild, non-obstructive coronary artery disease involving the mid LAD and mid RCA. 3.  Normal left ventricular filling pressure (LVEDP 13 mmHg). 4.  Successful balloon angioplasty of OM2 using 2.0 x 12 mm balloon with improvement in stenosis from 100% to 30% with restoration of TIMI-3 flow.  Stent was not placed due to the small vessel size with diffuse disease extending to the ostium.  Plan: 1.  Dual antiplatelet therapy for at least 1 month.  Given that patient is on warfarin as well, would recommend switching from ticagrelor (loaded in cath lab) to clopidogrel to reduce risk for bleeding complications. 2.  Restart warfarin tomorrow. 3.  Aggressive secondary prevention.  4.  Continue IV hydration x 6 hours, with close follow-up of renal status. (i.e. Studies not automatically included, echos, thoracentesis, etc; not x-rays)  Consultations:  Cardiology   Discharge Exam: Vitals:   10/25/15 0811 10/25/15 0952  BP:  (!) 157/63  Pulse:  78  Resp:    Temp: 97.7 F (36.5 C)     General: alert Cardiovascular: s1,s2 rrr Respiratory: CTA BL  Discharge Instructions  Discharge Instructions    AMB Referral to Cardiac Rehabilitation - Phase II    Complete by:  As directed   Diagnosis:  PTCA   Amb Referral to Cardiac Rehabilitation  Complete by:  As directed   Diagnosis:  PTCA   Diet - low sodium heart healthy    Complete by:  As directed   Discharge instructions    Complete by:  As directed   pls follow up with cardiology on 8/14. Check INR   Increase activity slowly    Complete by:  As directed       Medication List    TAKE these medications   allopurinol 300 MG tablet Commonly known as:  ZYLOPRIM Take 1 tablet (300 mg  total) by mouth daily.   aspirin 81 MG chewable tablet Chew 1 tablet (81 mg total) by mouth daily.   clopidogrel 75 MG tablet Commonly known as:  PLAVIX Take 1 tablet (75 mg total) by mouth daily. Start taking on:  10/26/2015   colchicine 0.6 MG tablet Take 1 tablet (0.6 mg total) by mouth daily as needed (for gout). Fill with colcrys   diltiazem 240 MG 24 hr capsule Commonly known as:  CARDIZEM CD Take 1 capsule (240 mg total) by mouth daily.   fluticasone 50 MCG/ACT nasal spray Commonly known as:  FLONASE Place 1 spray into both nostrils daily as needed for allergies.   lisinopril 40 MG tablet Commonly known as:  PRINIVIL,ZESTRIL TAKE 1 TABLET EVERY DAY   metoprolol tartrate 25 MG tablet Commonly known as:  LOPRESSOR TAKE 1/2 TABLET BY MOUTH TWICE A DAY   omeprazole 40 MG capsule Commonly known as:  PRILOSEC Take 1 capsule (40 mg total) by mouth daily.   rosuvastatin 10 MG tablet Commonly known as:  CRESTOR Take 1 tablet (10 mg total) by mouth daily at 6 PM.   warfarin 4 MG tablet Commonly known as:  COUMADIN Take 2-4 mg by mouth daily. Take 2mg  on Mon, wed, sun. Whole tablet rest of days per patient      Allergies  Allergen Reactions  . Penicillins Shortness Of Breath and Swelling    Has patient had a PCN reaction causing immediate rash, facial/tongue/throat swelling, SOB or lightheadedness with hypotensio: YES Has patient had a PCN reaction causing severe rash involving mucus membranes or skin necrosis: NO Has patient had a PCN reaction that required hospitalization NO Has patient had a PCN reaction occurring within the last 10 years: NO If all of the above answers are "NO", then may proceed with Cephalosporin use.  . Atorvastatin     Intolerant   . Doxycycline     GI upset   Follow-up Information    Elsie Stain, MD Follow up in 2 week(s).   Specialty:  Family Medicine Contact information: [place]  [address] [phone]        [place] [facility] .   Specialty:  Cardiology Why:  10/28/15 - 10:45am for Coumadin clinic and 11:30am for echocardiogram. Contact information: [address] [place]       Sherren Mocha, MD .   Specialty:  Cardiology Why:  Office will call you for your follow-up appointment. Contact information: [id number] [place] [address] [phone]            The results of significant diagnostics from this hospitalization (including imaging, microbiology, ancillary and laboratory) are listed below for reference.    Significant Diagnostic Studies: Dg Chest 2 View  Result Date: 10/21/2015 CLINICAL DATA:  Chest pain and shortness of breath since Friday. Pain radiates through bilateral arms. EXAM: CHEST  2  VIEW COMPARISON:  09/22/2009 FINDINGS: Heart size is normal. Small calcified pulmonary nodules are consistent with prior granulomatous disease. Calcifications are identified within the mediastinal lymph nodes. There are no focal consolidations or pleural effusions. IMPRESSION: No evidence for acute Prior granulomatous disease. Abnormality. Electronically Signed   By: Nolon Nations M.D.   On: 10/21/2015 19:25    Microbiology: No results found for this or any previous visit (from the past 240 hour(s)).   Labs: Basic Metabolic Panel:  Recent Labs Lab 10/21/15 1809 10/23/15 0339 10/24/15 0430 10/25/15 0513  NA 136 137 135 134*  K 4.6 4.1 3.9 4.4  CL 104 101 99* 99*  CO2 23 27 26 25   GLUCOSE 149* 117* 137* 132*  BUN 15 15 23* 20  CREATININE 1.28* 1.21 1.66* 1.32*  CALCIUM 9.0 9.2 9.0 9.2   Liver Function Tests:  Recent Labs Lab 10/23/15 0339 10/24/15 0430  AST 25 23  ALT 29 24  ALKPHOS 70 74  BILITOT 0.5 0.5  PROT 6.6 6.4*  ALBUMIN 3.6 3.6   No results for input(s): LIPASE, AMYLASE in the last 168 hours. No results for input(s): AMMONIA in  the last 168 hours. CBC:  Recent Labs Lab 10/21/15 1809 10/22/15 0119 10/23/15 0339 10/24/15 0430 10/25/15 0513  WBC 5.9 5.6 5.7 6.5 6.6  HGB 15.8 15.1 16.0 15.7 15.4  HCT 46.5 44.9 47.7 47.3 46.1  MCV 88.6 89.1 87.8 89.8 89.0  PLT 166 153 163 159 155   Cardiac Enzymes:  Recent Labs Lab 10/21/15 2224 10/22/15 0119 10/22/15 0322  TROPONINI <0.03 <0.03 <0.03   BNP: BNP (last 3 results) No results for input(s): BNP in the last 8760 hours.  ProBNP (last 3 results) No results for input(s): PROBNP in the last 8760 hours.  CBG: No results for input(s): GLUCAP in the last 168 hours.     SignedKinnie Feil  [facility] 10/25/2015, 10:51 AM

## 2015-10-25 NOTE — Progress Notes (Signed)
Patient: [name] / Admit Date: 10/21/2015 / Date of Encounter: 10/25/2015, 7:19 AM   Subjective: Feeling well. No CP or SOB. Didn't sleep due to machine beeping all night (bradycardia with pauses) - slept with CPAP but he is not sure he got a good seal.   Objective: Telemetry: persistent afib. Daytime rates OK, nocturnally some transient dips into the upper 30s-50s, longest pause 2.6 sec Physical Exam: Blood pressure 126/78, pulse 72, temperature (!) 96.9 F (36.1 C), temperature source Axillary, resp. rate 14, height 5\' 6"  (1.676 m), weight 216 lb 0.8 oz (98 kg), SpO2 100 %. Body mass index is 34.87 kg/m. General: Well developed obese WM Head: Normocephalic, atraumatic, sclera non-icteric, no xanthomas, nares are without discharge. Neck: Negative for carotid bruits. JVP not elevated. Lungs: Clear bilaterally to auscultation without wheezes, rales, or rhonchi. Breathing is unlabored. Heart: Irregularly irregular S1 S2 without murmurs, rubs, or gallops.  Abdomen: Soft, non-tender, non-distended with normoactive bowel sounds. No rebound/guarding. Extremities: No clubbing or cyanosis. No edema. Distal pedal pulses are 2+ and equal bilaterally. Right radial cath site without hematoma or ecchymosis; good pulse. Neuro: Alert and oriented X 3. Moves all extremities spontaneously. Psych:  Responds to questions appropriately with a normal affect.   Intake/Output Summary (Last 24 hours) at 10/25/15 0719 Last data filed at 10/25/15 0415  Gross per 24 hour  Intake              200 ml  Output              975 ml  Net             -775 ml    Inpatient Medications:  . allopurinol  300 mg Oral Daily  . aspirin  81 mg Oral Daily  . atorvastatin  80 mg Oral q1800  . diltiazem  240 mg Oral Daily  . metoprolol tartrate  12.5 mg Oral BID  . omega-3 acid ethyl esters  1 g Oral BID  . pantoprazole  80 mg Oral Daily  . sodium chloride flush  3 mL Intravenous Q12H  . ticagrelor  90 mg Oral Q12H     Infusions:    Labs:  Recent Labs  10/24/15 0430 10/25/15 0513  NA 135 134*  K 3.9 4.4  CL 99* 99*  CO2 26 25  GLUCOSE 137* 132*  BUN 23* 20  CREATININE 1.66* 1.32*  CALCIUM 9.0 9.2    Recent Labs  10/23/15 0339 10/24/15 0430  AST 25 23  ALT 29 24  ALKPHOS 70 74  BILITOT 0.5 0.5  PROT 6.6 6.4*  ALBUMIN 3.6 3.6    Recent Labs  10/24/15 0430 10/25/15 0513  WBC 6.5 6.6  HGB 15.7 15.4  HCT 47.3 46.1  MCV 89.8 89.0  PLT 159 155   No results for input(s): CKTOTAL, CKMB, TROPONINI in the last 72 hours. Invalid input(s): POCBNP No results for input(s): HGBA1C in the last 72 hours.   Radiology/Studies:  Dg Chest 2 View  Result Date: 10/21/2015 CLINICAL DATA:  Chest pain and shortness of breath since Friday. Pain radiates through bilateral arms. EXAM: CHEST  2 VIEW COMPARISON:  09/22/2009 FINDINGS: Heart size is normal. Small calcified pulmonary nodules are consistent with prior granulomatous disease. Calcifications are identified within the mediastinal lymph nodes. There are no focal consolidations or pleural effusions. IMPRESSION: No evidence for acute Prior granulomatous disease. Abnormality. Electronically Signed   By: Nolon Nations M.D.   On: 10/21/2015 19:25  Assessment and Plan  10M with previously nonobstructive CAD, persistent atrial fib (previously paroxysmal - CHADSVASC 4), HTN, HLD, suspected CKD stage III (based on historical labs) and obesity admitted with CP radiating to arms. Coumadin held in prep for cath. R/o for MI. LHC 10/24/15: occlusion of small diffusely diseased OM2 s/p balloon angioplasty, mild nonobstructive disease of mLAD and mRCA, normal LVEDP. Recommended DAPT x 1 month.  1. Unstable angina s/p angioplasty as above - will clarify final recs for blood thinners with MD given warfarin use. Already has appt for outpt echo 10/28/15.   2. Persistent atrial fib - he has had transient episodes of nocturnal bradycardia and pauses up to 2.6 sec,  asymptomatic. Suspect r/t sleep apnea. Daytime rates controlled. Can continue current regimen and observe for symptoms of significant daytime bradycardia.  3. CKD stage III - appears chronic, stable post-cath.  4. HTN - controlled.  Recheck temp this AM (hypothermic, suspect error). Cardiac rehab to see. May be a candidate for dc.  Signed, Melina Copa PA-C Pager: [phone]  I have personally seen and examined this patient with Melina Copa, PA-C. I agree with the assessment and plan as outlined above. He is s/p PTCA/angioplasty only of the small OM branch that was occluded. NO stent placed. Change brilinta to PLavix since he is on coumadin also. Continue DAPT with ASA, Plavix for one month. Exam this am shows well developed male with NAD, Irreg irreg with no murmurs, clear lungs, no LE edema. Labs reviewed. Renal function ok.  OK to discharge home today. Stop Brilinta. Load 300 mg Plavix. Start Plavix 75 mg daily tomorrow. Resume coumadin. Echo and coumadin clinic next week. F/U our office in 1-2 weeks with APP.   Lauree Chandler 10/25/2015 8:06 AM

## 2015-10-25 NOTE — Progress Notes (Signed)
CARDIAC REHAB PHASE I   PRE:  Rate/Rhythm: 89 a fib  BP:  Sitting: 139/87        SaO2: 99 RA  MODE:  Ambulation: 800 ft   POST:  Rate/Rhythm: 98 a fib  BP:  Sitting: 157/63         SaO2: 98 RA  Pt ambulated 800 ft on RA, handheld assist, steady gait, tolerated well with no complaints. Completed PCI education with pt and pt's daughter at bedside. Reviewed risk factors, anti-platelet therapy, activity restrictions, ntg, exercise, heart healthy diet, portion control, sodium restrictions and  phase 2 cardiac rehab. Pt verbalized understanding. Pt states he was started on lipitor in the past, and was unable to tolerate due to weakness in his legs, RN notified. Pt agrees to phase 2 cardiac rehab referral, will send to [facility] per pt request. Pt  to edge of bed per pt request after walk, call bell within reach.   AY:[id number] Lenna Sciara, RN, BSN 10/25/2015 9:06 AM

## 2015-10-25 NOTE — Progress Notes (Addendum)
Pt and family state he previously did not tolerate lipitor. They are amenable to Crestor. Will try lower dose of Crestor. Will need f/u labs as OP to follow liver/lipids. Dayna Dunn PA-C  Also to clarify: patient will need ASA, Plavix, coumadin for 1 month. At f/u appt will need to d/w primary cardiologist about whether to drop ASA or Plavix or both at 1 month out from PTCA depending on how he is doing. This was communicated to primary team as well as patient and his family. Discussed need to observe for any bleeding while on this regimen. Dayna Dunn PA-C

## 2015-10-25 NOTE — Progress Notes (Signed)
Pt already has Coumadin clinic/echo appt scheduled 10/28/15 - 10:45am for Coumadin clinic and 11:30am for echocardiogram.  I have sent a message to our [facility] office's scheduler requesting a follow-up appointment, and our office will call the patient with this information. (No one picking up phone at office yet.)  Melina Copa PA-C

## 2015-10-25 NOTE — Progress Notes (Signed)
Pt and daughter given all discharge orders and verbalized understanding of all.  Pt is aware of how and when to take all meds including coumadin, plavix and aspirin and that scripts have [facility].  F/u appts were discussed.  Pt without any complaints at present.  Rt radial is level 0. Pt discharged via wheelchair to car with daughter.

## 2015-10-28 ENCOUNTER — Other Ambulatory Visit: Payer: Self-pay

## 2015-10-28 ENCOUNTER — Ambulatory Visit (HOSPITAL_COMMUNITY): Payer: Medicare Other | Attending: Cardiovascular Disease

## 2015-10-28 ENCOUNTER — Ambulatory Visit (INDEPENDENT_AMBULATORY_CARE_PROVIDER_SITE_OTHER): Payer: Medicare Other | Admitting: Pharmacist

## 2015-10-28 DIAGNOSIS — I119 Hypertensive heart disease without heart failure: Secondary | ICD-10-CM | POA: Diagnosis not present

## 2015-10-28 DIAGNOSIS — I059 Rheumatic mitral valve disease, unspecified: Secondary | ICD-10-CM | POA: Insufficient documentation

## 2015-10-28 DIAGNOSIS — I4891 Unspecified atrial fibrillation: Secondary | ICD-10-CM

## 2015-10-28 DIAGNOSIS — G4733 Obstructive sleep apnea (adult) (pediatric): Secondary | ICD-10-CM | POA: Diagnosis not present

## 2015-10-28 DIAGNOSIS — I2510 Atherosclerotic heart disease of native coronary artery without angina pectoris: Secondary | ICD-10-CM | POA: Insufficient documentation

## 2015-10-28 DIAGNOSIS — Z5181 Encounter for therapeutic drug level monitoring: Secondary | ICD-10-CM

## 2015-10-28 LAB — POCT INR: INR: 1.3

## 2015-10-28 MED ORDER — LISINOPRIL 40 MG PO TABS: 40.0000 mg | ORAL_TABLET | Freq: Every day | ORAL | 2 refills | Status: DC

## 2015-10-29 ENCOUNTER — Telehealth: Payer: Self-pay

## 2015-10-29 NOTE — Telephone Encounter (Signed)
1st attempt for TCM outreach. No VM. Unable to leave message.

## 2015-10-29 NOTE — Telephone Encounter (Signed)
Transition Care Management Follow-up Telephone Call    Date discharged? 10/25/2015  How have you been since you were released from the hospital? [place].   Any patient concerns? None at this time    Items Reviewed:  Medications reviewed: Yes  Allergies reviewed: Yes  Dietary changes reviewed: Yes  Referrals reviewed: Yes   Functional Questionnaire:  Independent - I Dependent - D    Activities of Daily Living (ADLs):    Personal hygiene - I Dressing - I Eating - I Maintaining continence - I Transferring - I   Independent Activities of Daily Living (iADLs): Basic communication skills - I Transportation - I Meal preparation  - I Shopping - I Housework - I  Managing medications - I  Managing personal finances - I   Confirmed importance and date/time of follow-up visits scheduled YES  Provider Appointment booked with PCP 10/31/15 @ 1515  Confirmed with patient if condition begins to worsen call PCP or go to the ER.  Patient was given the office number and encouraged to call back with question or concerns: YES

## 2015-10-31 ENCOUNTER — Encounter (HOSPITAL_COMMUNITY): Payer: Self-pay | Admitting: Internal Medicine

## 2015-10-31 ENCOUNTER — Ambulatory Visit (INDEPENDENT_AMBULATORY_CARE_PROVIDER_SITE_OTHER): Payer: Medicare Other | Admitting: Family Medicine

## 2015-10-31 VITALS — BP 122/60 | HR 82 | Temp 98.6°F | Wt 213.8 lb

## 2015-10-31 DIAGNOSIS — Z9861 Coronary angioplasty status: Secondary | ICD-10-CM

## 2015-10-31 DIAGNOSIS — I25110 Atherosclerotic heart disease of native coronary artery with unstable angina pectoris: Secondary | ICD-10-CM

## 2015-10-31 DIAGNOSIS — I2510 Atherosclerotic heart disease of native coronary artery without angina pectoris: Secondary | ICD-10-CM | POA: Diagnosis not present

## 2015-10-31 NOTE — Progress Notes (Signed)
Pre visit review using our clinic review tool, if applicable. No additional management support is needed unless otherwise documented below in the visit note. 

## 2015-10-31 NOTE — Progress Notes (Signed)
Admit date: 10/21/2015 Discharge date: 10/25/2015  Time spent: >35 minutes  Recommendations for Outpatient Follow-up:  Cardiology f/u 8/14 INR to be checked on 8/14 PCP f/u 3-5 days as needed Discharge Diagnoses:  Principal Problem:   Unstable angina ([place]) Active Problems:   HYPERCHOLESTEROLEMIA   Essential hypertension   CAD (coronary artery disease), native coronary artery   OSA on CPAP   Chronic anticoagulation   Glucose intolerance (impaired glucose tolerance)   CKD (chronic kidney disease), stage III   Persistent atrial fibrillation (H[facility])   Hypertension   Hyperlipidemia   Bradycardia   Discharge Condition: stable   Diet recommendation: low sodium        Filed Weights   10/21/15 1808 10/21/15 2310 10/25/15 0404  Weight: 93 kg (205 lb) 96.5 kg (212 lb 11.2 oz) 98 kg (216 lb 0.8 oz)    History of present illness:  82 y.o.malewith medical history significant of HTN, Gout, A.Fib on rate control meds and coumadin, HTN, non-obstructive CAD on cath in 2010. Patient presents to the ED with c/o chest pain. Symptoms onset 3 days ago, have been intermittent with pain radiating down both arms. Resolve spontaneously and do not appear to be associated with exertion. Has been having several week history of worsening exertional dyspnea  Hospital Course:  Unstable angina. Underwent LHC and successful balloon angioplasty of OM2 using 2.0 x 12 mm balloon with improvement in stenosis from 100% to 30% with restoration of TIMI-3 flow. Stent was not placed due to the small vessel size with diffuse disease extending to the ostium. -cardiology recommended to cont ASA+plavix for 1 month, and they will follow up as outpatient   A fib. Cardiology recommended to cont coumadin in addition of dual antiplatelets at this time   Procedures:  [facility] RCA lesion, 15 %stenosed.  Ost 2nd Mrg to 2nd Mrg lesion, 50 %stenosed.  Mid LAD lesion, 30 %stenosed.  2nd Mrg lesion, 100  %stenosed.  Post intervention, there is a 30% residual stenosis.  LV end diastolic pressure is normal.  1. Occlusion of small, diffusely diseased OM2 branch. 2. Mild, non-obstructive coronary artery disease involving the mid LAD and mid RCA. 3. Normal left ventricular filling pressure (LVEDP 13 mmHg). 4. Successful balloon angioplasty of OM2 using 2.0 x 12 mm balloon with improvement in stenosis from 100% to 30% with restoration of TIMI-3 flow. Stent was not placed due to the small vessel size with diffuse disease extending to the ostium.  Plan: 1. Dual antiplatelet therapy for at least 1 month. Given that patient is on warfarin as well, would recommend switching from ticagrelor (loaded in cath lab) to clopidogrel to reduce risk for bleeding complications. 2. Restart warfarin tomorrow. 3. Aggressive secondary prevention.  4. Continue IV hydration x 6 hours, with close follow-up of renal status. (i.e. Studies not automatically included, echos, thoracentesis, etc; not x-rays)  --------------------------------------------  The above d/w pt. Admitted with unstable angina. Underwent LHC and successful balloon angioplasty of OM2 using 2.0 x 12 mm balloon with improvement in stenosis from 100% to 30% with restoration of TIMI-3 flow. Stent was not placed due to the small vessel size with diffuse disease extending to the ostium. -cardiology recommended to cont ASA+plavix for 1 month, and they will follow up as outpatient.  In meantime, had INR recheck done, due for BMET today.   Compliant with meds.  No CP, not SOB.  Feeling well.   D/w pt about rationale for current meds and tx.    Noted by  patient that prev stinging sensation in the feet resolved since cath.  Unclear why he is better from that standpoint, d/w pt, but we're both happy that he is doing better.   PMH and SH reviewed  ROS: Per HPI unless specifically indicated in ROS section   Meds, vitals, and allergies reviewed.    GEN: nad, alert and oriented HEENT: mucous membranes moist NECK: supple w/o LA CV: IRR PULM: ctab, no inc wob ABD: soft, +bs EXT: no edema SKIN: no acute rash Radial cath site unremarkable, healed.

## 2015-10-31 NOTE — Patient Instructions (Signed)
Go to the lab on the way out.  We'll contact you with your lab report. Take care.  Glad to see you.  Don't change your meds for now.

## 2015-10-31 NOTE — Assessment & Plan Note (Signed)
Admitted with unstable angina. Underwent LHC and successful balloon angioplasty of OM2 using 2.0 x 12 mm balloon with improvement in stenosis from 100% to 30% with restoration of TIMI-3 flow. Stent was not placed due to the small vessel size with diffuse disease extending to the ostium. -cardiology recommended to cont ASA+plavix for 1 month, and they will follow up as outpatient.  D/w pt.  Continue as is. Recheck BMET today.  He'll f/u with cards.  Doing well.  App help of all involved.

## 2015-11-01 ENCOUNTER — Other Ambulatory Visit: Payer: Self-pay | Admitting: Family Medicine

## 2015-11-01 ENCOUNTER — Encounter: Payer: Self-pay | Admitting: Cardiology

## 2015-11-01 ENCOUNTER — Ambulatory Visit (INDEPENDENT_AMBULATORY_CARE_PROVIDER_SITE_OTHER): Payer: Medicare Other | Admitting: Cardiology

## 2015-11-01 VITALS — BP 130/58 | HR 80 | Ht 66.0 in | Wt 214.4 lb

## 2015-11-01 DIAGNOSIS — Z9861 Coronary angioplasty status: Secondary | ICD-10-CM

## 2015-11-01 DIAGNOSIS — I4819 Other persistent atrial fibrillation: Secondary | ICD-10-CM

## 2015-11-01 DIAGNOSIS — E785 Hyperlipidemia, unspecified: Secondary | ICD-10-CM

## 2015-11-01 DIAGNOSIS — I2510 Atherosclerotic heart disease of native coronary artery without angina pectoris: Secondary | ICD-10-CM

## 2015-11-01 DIAGNOSIS — I10 Essential (primary) hypertension: Secondary | ICD-10-CM

## 2015-11-01 DIAGNOSIS — I481 Persistent atrial fibrillation: Secondary | ICD-10-CM | POA: Diagnosis not present

## 2015-11-01 LAB — BASIC METABOLIC PANEL
BUN: 29 mg/dL — AB (ref 6–23)
CALCIUM: 10 mg/dL (ref 8.4–10.5)
CO2: 28 meq/L (ref 19–32)
CREATININE: 1.73 mg/dL — AB (ref 0.40–1.50)
Chloride: 103 mEq/L (ref 96–112)
GFR: 40.31 mL/min — AB (ref >60.00)
GLUCOSE: 83 mg/dL (ref 70–99)
Potassium: 5.2 mEq/L — ABNORMAL HIGH (ref 3.5–5.1)
SODIUM: 138 meq/L (ref 135–145)

## 2015-11-01 NOTE — Progress Notes (Signed)
11/01/2015 [name]   Jul 23, 1930  OR:[id number]  Primary Physician Elsie Stain, MD Primary Cardiologist: Dr. Burt Knack   Reason for Visit/CC: F/u Chest Pain/ CAD  HPI:  82 year old male with a prior medical history of non obstructive CAD, paroxsymal Afib, HTN, HLD, and obesity who was recently admitted and underwent evaluation for recurrent CP. He ruled out for MI and underwent LHC on 10/24/15, per Dr. Tamala Julian. He was found to have occlusion of small, diffusely diseased OM2 branch. There was also mild, non-obstructive coronary artery disease involving the mid LAD and mid RCA. LV filling pressure was normal with LVEDP of 13 mmHg. He underwent successful balloon angioplasty of the OM2 using 2.0 x 12 mm balloon with improvement in stenosis from 100% to 30% with restoration of TIMI-3 flow.  Stent was not placed due to the small vessel size with diffuse disease extending to the ostium. Plan was for DAPT for at least 1 month. Given he was on Warfarin as well for PAF, he was switched from Brilinta to Plavix, prior to discharge to reduce his risk for bleeding complications. He was placed on Crestor for DLD (previously intolerant to Lipitor), also on a BB + ACE-I.  He also had a 2D echo which showed normal LVEF of 55-60%.   He presents to clinic today for follow-up. He reports that he has done well since discharge. He has had no recurrent chest pain. No dyspnea. He is tolerating his medications well without side effects. No myalgias with Crestor. He denies any abnormal bleeding with triple therapy.  EKG today shows atrial fibrillation with a controlled VR of 79 bpm.   Current Outpatient Prescriptions  Medication Sig Dispense Refill  . allopurinol (ZYLOPRIM) 300 MG tablet Take 1 tablet (300 mg total) by mouth daily.    [name] aspirin 81 MG chewable tablet Chew 1 tablet (81 mg total) by mouth daily. 30 tablet 0  . clopidogrel (PLAVIX) 75 MG tablet Take 1 tablet (75 mg total) by mouth daily. 30 tablet 0  .  colchicine (COLCRYS) 0.6 MG tablet Take 0.6 mg by mouth daily.    [name] diltiazem (CARDIZEM CD) 240 MG 24 hr capsule Take 1 capsule (240 mg total) by mouth daily. 30 capsule 11  . famotidine (PEPCID) 20 MG tablet Take 20 mg by mouth 2 (two) times daily.    . fluticasone (FLONASE) 50 MCG/ACT nasal spray Place 1 spray into both nostrils daily as needed for allergies.     [name] lisinopril (PRINIVIL,ZESTRIL) 40 MG tablet Take 1 tablet (40 mg total) by mouth daily. 90 tablet 2  . metoprolol tartrate (LOPRESSOR) 25 MG tablet TAKE 1/2 TABLET BY MOUTH TWICE A DAY 30 tablet 9  . rosuvastatin (CRESTOR) 10 MG tablet Take 1 tablet (10 mg total) by mouth daily at 6 PM. 30 tablet 0  . warfarin (COUMADIN) 4 MG tablet Take 2-4 mg by mouth daily. Take 2mg  on Mon, wed, sun. Whole tablet rest of days per patient     No current facility-administered medications for this visit.     Allergies  Allergen Reactions  . Penicillins Shortness Of Breath and Swelling    Has patient had a PCN reaction causing immediate rash, facial/tongue/throat swelling, SOB or lightheadedness with hypotensio: YES Has patient had a PCN reaction causing severe rash involving mucus membranes or skin necrosis: NO Has patient had a PCN reaction that required hospitalization NO Has patient had a PCN reaction occurring within the last 10 years: NO If all  of the above answers are "NO", then may proceed with Cephalosporin use.  . Atorvastatin     Intolerant   . Biaxin [Clarithromycin] Diarrhea  . Doxycycline     GI upset    Social History   Social History  . Marital status: Widowed    Spouse name: N/A  . Number of children: 3  . Years of education: N/A   Occupational History  . 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 Smoking status: Former Smoker    Packs/day: 0.12    Years: 4.00    Types: Cigarettes    Quit date: 03/16/1969  . Smokeless tobacco: Never Used     Comment: 1 pack/week for about 4  years, quit 1971  . Alcohol use No     Comment: 10/23/2015 "Quit all alcohol in 1970's"  . Drug use: No  . Sexual activity: Not on file   Other Topics Concern  . Not on file   Social History Narrative   Lives in [place], married 1953, widowed after 92 years   Retired Administrator, still does Biomedical scientist.   He is not routinely exercising.   Daily caffeine      Review of Systems: General: negative for chills, fever, night sweats or weight changes.  Cardiovascular: negative for chest pain, dyspnea on exertion, edema, orthopnea, palpitations, paroxysmal nocturnal dyspnea or shortness of breath Dermatological: negative for rash Respiratory: negative for cough or wheezing Urologic: negative for hematuria Abdominal: negative for nausea, vomiting, diarrhea, bright red blood per rectum, melena, or hematemesis Neurologic: negative for visual changes, syncope, or dizziness All other systems reviewed and are otherwise negative except as noted above.    Height 5\' 6"  (1.676 m), weight 214 lb 6.4 oz (97.3 kg).  General appearance: alert, cooperative and no distress Neck: no carotid bruit and no JVD Lungs: clear to auscultation bilaterally Heart: irregularly irregular rhythm and regular rate Extremities: trace bilateral LEE Pulses: 2+ and symmetric Skin: warm and dry Neurologic: Grossly normal  EKG atrial fibrillation 79 bpm   ASSESSMENT AND PLAN:   1. CAD: s/p successful balloon angioplasty of the OM2 using 2.0 x 12 mm balloon with improvement in stenosis from 100% to 30% with restoration of TIMI-3 flow.  Stent was not placed due to the small vessel size with diffuse disease extending to the ostium. He denies any recurrent CP. Continue triple therapy x 1 month with ASA, Plavix and Coumadin. Per discharge progress note, "At f/u appt will need to d/w primary cardiologist about whether to drop ASA or Plavix or both at 1 month out from PTCA depending on how he is doing". I will route this to  Dr. Burt Knack to decide. Continue BB, ACE-I and statin.  2. Atrial Fibrillation: CVR on BB. Continue Coumadin. His INR is followed in our office. He denies abnormal bleeding.   3. DLD: on Crestor. Tolerating well w/o side effects. Will order FLP + HFTs in 6 weeks.   PLAN  F/u with Dr. Burt Knack in 2-3 months.   Lyda Jester PA-C 11/01/2015 2:32 PM

## 2015-11-01 NOTE — Patient Instructions (Addendum)
Medication Instructions:   Your physician recommends that you continue on your current medications as directed. Please refer to the Current Medication list given to you today.   If you need a refill on your cardiac medications before your next appointment, please call your pharmacy.  Labwork:  RETURN IN 6 TO 8 WEEKS FOR FASTING LIPIDS AND LFT    Testing/Procedures: NONE ORDER TODAY   Follow-Up: 2 TO 3 MONTHS WITH DR Burt Knack    Any Other Special Instructions Will Be Listed Below (If Applicable).

## 2015-11-05 ENCOUNTER — Other Ambulatory Visit (INDEPENDENT_AMBULATORY_CARE_PROVIDER_SITE_OTHER): Payer: Medicare Other

## 2015-11-05 ENCOUNTER — Ambulatory Visit (INDEPENDENT_AMBULATORY_CARE_PROVIDER_SITE_OTHER): Payer: Medicare Other | Admitting: *Deleted

## 2015-11-05 DIAGNOSIS — I10 Essential (primary) hypertension: Secondary | ICD-10-CM | POA: Diagnosis not present

## 2015-11-05 DIAGNOSIS — Z5181 Encounter for therapeutic drug level monitoring: Secondary | ICD-10-CM

## 2015-11-05 DIAGNOSIS — I4891 Unspecified atrial fibrillation: Secondary | ICD-10-CM

## 2015-11-05 DIAGNOSIS — E785 Hyperlipidemia, unspecified: Secondary | ICD-10-CM

## 2015-11-05 LAB — BASIC METABOLIC PANEL
BUN: 24 mg/dL — ABNORMAL HIGH (ref 6–23)
CALCIUM: 9 mg/dL (ref 8.4–10.5)
CHLORIDE: 102 meq/L (ref 96–112)
CO2: 27 meq/L (ref 19–32)
Creatinine, Ser: 1.47 mg/dL (ref 0.40–1.50)
GFR: 48.64 mL/min — AB (ref >60.00)
Glucose, Bld: 129 mg/dL — ABNORMAL HIGH (ref 70–99)
POTASSIUM: 5 meq/L (ref 3.5–5.1)
SODIUM: 137 meq/L (ref 135–145)

## 2015-11-05 LAB — POCT INR: INR: 2.9

## 2015-11-05 NOTE — Progress Notes (Signed)
Released future orders by cards in error. Re-entered and notified cards.

## 2015-11-05 NOTE — Addendum Note (Signed)
Addended by: Royann Shivers A on: 11/05/2015 11:52 AM   Modules accepted: Orders

## 2015-11-05 NOTE — Addendum Note (Signed)
Addended by: Royann Shivers A on: 11/05/2015 11:40 AM   Modules accepted: Orders

## 2015-11-06 ENCOUNTER — Other Ambulatory Visit: Payer: Self-pay | Admitting: Family Medicine

## 2015-11-06 DIAGNOSIS — R7989 Other specified abnormal findings of blood chemistry: Secondary | ICD-10-CM

## 2015-11-08 ENCOUNTER — Encounter (HOSPITAL_COMMUNITY): Payer: Self-pay

## 2015-11-08 ENCOUNTER — Observation Stay (HOSPITAL_COMMUNITY)
Admission: EM | Admit: 2015-11-08 | Discharge: 2015-11-09 | Disposition: A | Discharge status: Home / Self Care | Payer: Medicare Other | Attending: Cardiology | Admitting: Cardiology

## 2015-11-08 ENCOUNTER — Emergency Department (HOSPITAL_COMMUNITY): Payer: Medicare Other

## 2015-11-08 DIAGNOSIS — Z7902 Long term (current) use of antithrombotics/antiplatelets: Secondary | ICD-10-CM | POA: Insufficient documentation

## 2015-11-08 DIAGNOSIS — Z87891 Personal history of nicotine dependence: Secondary | ICD-10-CM | POA: Insufficient documentation

## 2015-11-08 DIAGNOSIS — I130 Hypertensive heart and chronic kidney disease with heart failure and stage 1 through stage 4 chronic kidney disease, or unspecified chronic kidney disease: Secondary | ICD-10-CM | POA: Diagnosis not present

## 2015-11-08 DIAGNOSIS — I209 Angina pectoris, unspecified: Secondary | ICD-10-CM

## 2015-11-08 DIAGNOSIS — Z7901 Long term (current) use of anticoagulants: Secondary | ICD-10-CM | POA: Diagnosis not present

## 2015-11-08 DIAGNOSIS — I482 Chronic atrial fibrillation, unspecified: Secondary | ICD-10-CM

## 2015-11-08 DIAGNOSIS — Z79899 Other long term (current) drug therapy: Secondary | ICD-10-CM | POA: Insufficient documentation

## 2015-11-08 DIAGNOSIS — R079 Chest pain, unspecified: Secondary | ICD-10-CM | POA: Diagnosis not present

## 2015-11-08 DIAGNOSIS — R0602 Shortness of breath: Secondary | ICD-10-CM | POA: Diagnosis not present

## 2015-11-08 DIAGNOSIS — Z9861 Coronary angioplasty status: Secondary | ICD-10-CM

## 2015-11-08 DIAGNOSIS — R0789 Other chest pain: Principal | ICD-10-CM | POA: Insufficient documentation

## 2015-11-08 DIAGNOSIS — N183 Chronic kidney disease, stage 3 (moderate): Secondary | ICD-10-CM | POA: Diagnosis not present

## 2015-11-08 DIAGNOSIS — I2510 Atherosclerotic heart disease of native coronary artery without angina pectoris: Secondary | ICD-10-CM | POA: Diagnosis not present

## 2015-11-08 DIAGNOSIS — I208 Other forms of angina pectoris: Secondary | ICD-10-CM | POA: Diagnosis present

## 2015-11-08 DIAGNOSIS — I5031 Acute diastolic (congestive) heart failure: Secondary | ICD-10-CM | POA: Diagnosis not present

## 2015-11-08 DIAGNOSIS — I2089 Other forms of angina pectoris: Secondary | ICD-10-CM | POA: Diagnosis present

## 2015-11-08 DIAGNOSIS — Z7982 Long term (current) use of aspirin: Secondary | ICD-10-CM | POA: Diagnosis not present

## 2015-11-08 LAB — CBC WITH DIFFERENTIAL/PLATELET
Basophils Absolute: 0 10*3/uL (ref 0.0–0.1)
Basophils Relative: 0 %
EOS ABS: 0.1 10*3/uL (ref 0.0–0.7)
Eosinophils Relative: 2 %
HCT: 44.1 % (ref 39.0–52.0)
HEMOGLOBIN: 14.7 g/dL (ref 13.0–17.0)
LYMPHS ABS: 1.4 10*3/uL (ref 0.7–4.0)
Lymphocytes Relative: 21 %
MCH: 29.8 pg (ref 26.0–34.0)
MCHC: 33.3 g/dL (ref 30.0–36.0)
MCV: 89.3 fL (ref 78.0–100.0)
MONOS PCT: 9 %
Monocytes Absolute: 0.6 10*3/uL (ref 0.1–1.0)
NEUTROS PCT: 68 %
Neutro Abs: 4.6 10*3/uL (ref 1.7–7.7)
Platelets: 165 10*3/uL (ref 150–400)
RBC: 4.94 MIL/uL (ref 4.22–5.81)
RDW: 14.7 % (ref 11.5–15.5)
WBC: 6.7 10*3/uL (ref 4.0–10.5)

## 2015-11-08 LAB — I-STAT TROPONIN, ED: Troponin i, poc: 0 ng/mL (ref 0.00–0.08)

## 2015-11-08 LAB — BRAIN NATRIURETIC PEPTIDE: B NATRIURETIC PEPTIDE 5: 121.1 pg/mL — AB (ref 0.0–100.0)

## 2015-11-08 LAB — COMPREHENSIVE METABOLIC PANEL WITH GFR
ALT: 29 U/L (ref 17–63)
AST: 26 U/L (ref 15–41)
Albumin: 3.8 g/dL (ref 3.5–5.0)
Alkaline Phosphatase: 65 U/L (ref 38–126)
Anion gap: 11 (ref 5–15)
BUN: 21 mg/dL — ABNORMAL HIGH (ref 6–20)
CO2: 21 mmol/L — ABNORMAL LOW (ref 22–32)
Calcium: 9 mg/dL (ref 8.9–10.3)
Chloride: 104 mmol/L (ref 101–111)
Creatinine, Ser: 1.41 mg/dL — ABNORMAL HIGH (ref 0.61–1.24)
GFR calc Af Amer: 52 mL/min — ABNORMAL LOW
GFR calc non Af Amer: 45 mL/min — ABNORMAL LOW
Glucose, Bld: 132 mg/dL — ABNORMAL HIGH (ref 65–99)
Potassium: 4.6 mmol/L (ref 3.5–5.1)
Sodium: 136 mmol/L (ref 135–145)
Total Bilirubin: 0.8 mg/dL (ref 0.3–1.2)
Total Protein: 6.8 g/dL (ref 6.5–8.1)

## 2015-11-08 LAB — TROPONIN I
Troponin I: <0.03 ng/mL (ref <0.03)
Troponin I: <0.03 ng/mL (ref <0.03)

## 2015-11-08 LAB — PROTIME-INR
INR: 2.53
Prothrombin Time: 27.8 s — ABNORMAL HIGH (ref 11.4–15.2)

## 2015-11-08 MED ORDER — CLOPIDOGREL BISULFATE 75 MG PO TABS
75.0000 mg | ORAL_TABLET | Freq: Every day | ORAL | Status: DC
  Administered 2015-11-08 – 2015-11-09 (×2): 75 mg via ORAL
  Filled 2015-11-08 (×2): qty 1

## 2015-11-08 MED ORDER — DILTIAZEM HCL ER COATED BEADS 180 MG PO CP24
180.0000 mg | ORAL_CAPSULE | Freq: Every day | ORAL | Status: DC
  Administered 2015-11-08 – 2015-11-09 (×2): 180 mg via ORAL
  Filled 2015-11-08 (×2): qty 1

## 2015-11-08 MED ORDER — FUROSEMIDE 10 MG/ML IJ SOLN
20.0000 mg | Freq: Two times a day (BID) | INTRAMUSCULAR | Status: DC
  Administered 2015-11-08: 20 mg via INTRAVENOUS
  Filled 2015-11-08 (×2): qty 2

## 2015-11-08 MED ORDER — ASPIRIN 81 MG PO CHEW
81.0000 mg | CHEWABLE_TABLET | Freq: Every day | ORAL | Status: DC
  Administered 2015-11-09: 81 mg via ORAL
  Filled 2015-11-08: qty 1

## 2015-11-08 MED ORDER — ALLOPURINOL 300 MG PO TABS
300.0000 mg | ORAL_TABLET | Freq: Every day | ORAL | Status: DC
  Administered 2015-11-08 – 2015-11-09 (×2): 300 mg via ORAL
  Filled 2015-11-08 (×2): qty 1

## 2015-11-08 MED ORDER — FAMOTIDINE 20 MG PO TABS
20.0000 mg | ORAL_TABLET | Freq: Every day | ORAL | Status: DC
  Administered 2015-11-08 – 2015-11-09 (×2): 20 mg via ORAL
  Filled 2015-11-08 (×2): qty 1

## 2015-11-08 MED ORDER — LISINOPRIL 40 MG PO TABS
40.0000 mg | ORAL_TABLET | Freq: Every day | ORAL | Status: DC
  Administered 2015-11-08 – 2015-11-09 (×2): 40 mg via ORAL
  Filled 2015-11-08 (×2): qty 1

## 2015-11-08 MED ORDER — ROSUVASTATIN CALCIUM 10 MG PO TABS
10.0000 mg | ORAL_TABLET | Freq: Every day | ORAL | Status: DC
  Administered 2015-11-08: 10 mg via ORAL
  Filled 2015-11-08: qty 1

## 2015-11-08 MED ORDER — ONDANSETRON HCL 4 MG/2ML IJ SOLN: 4.0000 mg | Freq: Four times a day (QID) | INTRAMUSCULAR | Status: DC | PRN

## 2015-11-08 MED ORDER — ACETAMINOPHEN 325 MG PO TABS: 650.0000 mg | ORAL_TABLET | ORAL | Status: DC | PRN

## 2015-11-08 MED ORDER — METOPROLOL TARTRATE 12.5 MG HALF TABLET
12.5000 mg | ORAL_TABLET | Freq: Two times a day (BID) | ORAL | Status: DC
  Administered 2015-11-08 – 2015-11-09 (×2): 12.5 mg via ORAL
  Filled 2015-11-08 (×3): qty 1

## 2015-11-08 MED ORDER — ISOSORBIDE MONONITRATE ER 30 MG PO TB24
30.0000 mg | ORAL_TABLET | Freq: Every day | ORAL | Status: DC
  Administered 2015-11-08 – 2015-11-09 (×2): 30 mg via ORAL
  Filled 2015-11-08 (×2): qty 1

## 2015-11-08 MED ORDER — NITROGLYCERIN 0.4 MG SL SUBL: 0.4000 mg | SUBLINGUAL_TABLET | SUBLINGUAL | Status: DC | PRN

## 2015-11-08 MED ORDER — ASPIRIN EC 81 MG PO TBEC: 81.0000 mg | DELAYED_RELEASE_TABLET | Freq: Every day | ORAL | Status: DC

## 2015-11-08 NOTE — ED Triage Notes (Signed)
Pt arrives EMS with c/o chest apin and SHOB woken from sleep at 0400. C/o tight in chest and across shoulders. EMS gave nitro x 2 and ASA324. Now states no chest pain but continues with shoulder discomfort. Balloon cath 2 weeks ago

## 2015-11-08 NOTE — ED Notes (Signed)
MD at bedside. 

## 2015-11-08 NOTE — H&P (Signed)
Cardiology H&P    Patient ID: [name] MRN: [id number], DOB/AGE: 09-27-1930   Admit date: 11/08/2015 Date of Consult: 11/08/2015  Primary Physician: Elsie Stain, MD Primary Cardiologist: Dr. Burt Knack  Patient Profile  [name] is a 82 year old male with a past medical history of CAD, OSA, paroxsymal Afib, HTN, HLD, and obesity. He recently had POBA of his OM2 on 10/24/15. He presents to the ED on 11/08/15 with interscapular pain and SOB.   History of Present Illness  [name] woke up this morning around 4 am with extreme dyspnea. He wears a CPAP at night for his OSA and woke up suddenly as he could not catch his breath. He checked his CPAP machine and it was functioning normally. He then developed interscapular pain and decided to have his wife drive him to the fire station 1 mile from his house. He was advised to go to the Emergency room.   He got SL Nitroglycerin in the ED and it reduced his interscapular pain but did not fully relieve it. He also says that he has slight chest pressure that has been intermittent since this am. He endorses associated nausea but denies diaphoresis.   [name] had a left heart cath 2 weeks ago on 10/24/15, at that time he had a total occlusion of his OM2 branch. He underwent successful balloon angioplasty of the OM2 with restoration of TIMI -3 flow. He was placed on triple therapy with ASA, Plavix and Coumadin. He has been taking all 3 with high compliance.   [name] also notes that since his diltiazem was increased at the beginning of the month, he has not had much energy. He says he feels more fatigued and his activity tolerance has reduced.   Past Medical History   Past Medical History:  Diagnosis Date  . Bradycardia    a. nocturnal with pauses overnight on tele likely due to OSA 10/2015.  [name] CKD (chronic kidney disease), stage III   . Coronary artery disease    a. prev nonobst. b. LHC 10/24/15: occlusion of small diffusely diseased OM2  s/p balloon angioplasty, mild nonobstructive disease of mLAD and mRCA, normal LVEDP  . Diverticulosis   . Esophageal reflux   . Esophageal stricture from GERD 06/08/2011   With stricture at GE junction on EGD, dilated 06/2011   . Gastric ulcer    on EGD 2013  . Gout, unspecified   . Hyperlipidemia   . Hypertension   . Obesity, unspecified   . Orthopnea   . OSA on CPAP   . Paroxysmal nocturnal dyspnea   . Persistent atrial fibrillation ([place])   . Vertigo     Past Surgical History:  Procedure Laterality Date  . BALLOON DILATION N/A 10/22/2014   Procedure: BALLOON DILATION;  Surgeon: [name], MD;  Location: [facility];  Service: Endoscopy;  Laterality: N/A;  . CARDIAC CATHETERIZATION  07/2008  . CARDIAC CATHETERIZATION N/A 10/24/2015   Procedure: Left Heart Cath and Coronary Angiography;  Surgeon: [name], MD;  Location: [facility];  Service: Cardiovascular;  Laterality: N/A;  . CARDIAC CATHETERIZATION N/A 10/24/2015   Procedure: Coronary Balloon Angioplasty;  Surgeon: [name], MD;  Location: [facility];  Service: Cardiovascular;  Laterality: N/A;  . Carotid Dopplers  09/2009   no sig extracranial stenosis and vertebral arteries had antegrade flow  . CATARACT EXTRACTION W/ INTRAOCULAR LENS  IMPLANT, BILATERAL Bilateral   . ESOPHAGOGASTRODUODENOSCOPY (EGD) WITH ESOPHAGEAL DILATION     "  I've had it stretched 3 times" (10/23/2015)  . ESOPHAGOGASTRODUODENOSCOPY (EGD) WITH PROPOFOL N/A 10/22/2014   Procedure: ESOPHAGOGASTRODUODENOSCOPY (EGD) WITH PROPOFOL;  Surgeon: [name], MD;  Location: [facility];  Service: Endoscopy;  Laterality: N/A;  . MRI of brain  09/2009   chronic microvascular ischemia  . OSA Sleep Study     per Dr. Brett Fairy  . SHOULDER ARTHROSCOPY W/ ROTATOR CUFF REPAIR Right 2008     Allergies  Allergies  Allergen Reactions  . Penicillins Shortness Of Breath and Swelling    Has patient had a PCN reaction causing immediate rash,  facial/tongue/throat swelling, SOB or lightheadedness with hypotensio: YES Has patient had a PCN reaction causing severe rash involving mucus membranes or skin necrosis: NO Has patient had a PCN reaction that required hospitalization NO Has patient had a PCN reaction occurring within the last 10 years: NO If all of the above answers are "NO", then may proceed with Cephalosporin use.  . Atorvastatin Other (See Comments)    Intolerant   . Biaxin [Clarithromycin] Diarrhea  . Doxycycline Other (See Comments)    GI upset    Inpatient Medications      Family History    Family History  Problem Relation Age of Onset  . Stomach cancer Mother     died in 35's   . Hypertension Father     died in his 2's pna likely dementia  . Dementia Father   . Pneumonia Father   . Heart disease Brother     S/P CABG  . Hypertension Other   . Prostate cancer Neg Hx   . Colon cancer Neg Hx     Social History    Social History   Social History  . Marital status: Widowed    Spouse name: N/A  . Number of children: 3  . Years of education: N/A   Occupational History  . 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 Smoking status: Former Smoker    Packs/day: 0.12    Years: 4.00    Types: Cigarettes    Quit date: 03/16/1969  . Smokeless tobacco: Never Used     Comment: 1 pack/week for about 4 years, quit 1971  . Alcohol use No     Comment: 10/23/2015 "Quit all alcohol in 1970's"  . Drug use: No  . Sexual activity: Not on file   Other Topics Concern  . Not on file   Social History Narrative   Lives in [place], married 1953, widowed after 45 years   Retired Administrator, still does Biomedical scientist.   He is not routinely exercising.   Daily caffeine      Review of Systems    General:  No chills, fever, night sweats or weight changes.  Cardiovascular:  + chest pain, + dyspnea on exertion, edema, orthopnea, palpitations, paroxysmal nocturnal  dyspnea. Dermatological: No rash, lesions/masses Respiratory: No cough, dyspnea Urologic: No hematuria, dysuria Abdominal:   No nausea, vomiting, diarrhea, bright red blood per rectum, melena, or hematemesis Neurologic:  No visual changes, wkns, changes in mental status. All other systems reviewed and are otherwise negative except as noted above.  Physical Exam    Blood pressure 117/68, pulse 71, temperature 98.1 F (36.7 C), resp. rate 18, height 5\' 6"  (1.676 m), weight 205 lb (93 kg), SpO2 97 %.  General: Pleasant, NAD Psych: Normal affect. Neuro: Alert and oriented X 3. Moves all extremities spontaneously. HEENT: Normal  Neck:  Supple without bruits or JVD. Lungs:  Resp regular and unlabored, CTA. Heart: RRR no s3, s4, or murmurs. Abdomen: Protuberant  non-tender, non-distended, BS + x 4.  Extremities: No clubbing, cyanosis or edema. DP/PT/Radials 2+ and equal bilaterally.  Labs    Troponin [facility] Test)  Recent Labs  11/08/15 0843  TROPIPOC 0.00    Lab Results  Component Value Date   WBC 6.7 11/08/2015   HGB 14.7 11/08/2015   HCT 44.1 11/08/2015   MCV 89.3 11/08/2015   PLT 165 11/08/2015    Recent Labs Lab 11/08/15 0836  NA 136  K 4.6  CL 104  CO2 21*  BUN 21*  CREATININE 1.41*  CALCIUM 9.0  PROT 6.8  BILITOT 0.8  ALKPHOS 65  ALT 29  AST 26  GLUCOSE 132*   Lab Results  Component Value Date   CHOL 176 10/24/2015   HDL 23 (L) 10/24/2015   LDLCALC 82 10/24/2015   TRIG 357 (H) 10/24/2015     Radiology Studies    Dg Chest 2 View  Result Date: 10/21/2015 CLINICAL DATA:  Chest pain and shortness of breath since Friday. Pain radiates through bilateral arms. EXAM: CHEST  2 VIEW COMPARISON:  09/22/2009 FINDINGS: Heart size is normal. Small calcified pulmonary nodules are consistent with prior granulomatous disease. Calcifications are identified within the mediastinal lymph nodes. There are no focal consolidations or pleural effusions. IMPRESSION: No  evidence for acute Prior granulomatous disease. Abnormality. Electronically Signed   By: Nolon Nations M.D.   On: 10/21/2015 19:25   Dg Chest Port 1 View  Result Date: 11/08/2015 CLINICAL DATA:  Acute onset chest pain and shortness of breath this morning. Coronary artery disease. EXAM: PORTABLE CHEST 1 VIEW COMPARISON:  10/21/2015 FINDINGS: The heart size and mediastinal contours are within normal limits. No evidence of pulmonary infiltrate or edema. No evidence of pleural effusion. Numerous tiny calcified granulomata are again seen in both lungs, consistent with old granulomatous disease. IMPRESSION: No active disease. Electronically Signed   By: Earle Gell M.D.   On: 11/08/2015 09:32    EKG & Cardiac Imaging    EKG: atrial fibrillation with rates of 71  Echocardiogram:  10/24/15 Study Conclusions  - Left ventricle: The cavity size was normal. Wall thickness was   normal. Systolic function was normal. The estimated ejection   fraction was in the range of 55% to 60%. Wall motion was normal;   there were no regional wall motion abnormalities. - Mitral valve: Moderately calcified annulus. Mildly thickened,   mildly calcified leaflets . Valve area by continuity equation   (using LVOT flow): 1.28 cm^2. - Left atrium: The atrium was moderately dilated. - Right ventricle: The cavity size was mildly dilated. Systolic   function was mildly reduced.  Coronary Balloon Angioplasty  Left Heart Cath and Coronary Angiography 10/24/15   Mid RCA lesion, 15 %stenosed.  Ost 2nd Mrg to 2nd Mrg lesion, 50 %stenosed.  Mid LAD lesion, 30 %stenosed.  2nd Mrg lesion, 100 %stenosed.  Post intervention, there is a 30% residual stenosis.  LV end diastolic pressure is normal.   1.  Occlusion of small, diffusely diseased OM2 branch. 2.  Mild, non-obstructive coronary artery disease involving the mid LAD and mid RCA. 3.  Normal left ventricular filling pressure (LVEDP 13 mmHg). 4.  Successful  balloon angioplasty of OM2 using 2.0 x 12 mm balloon with improvement in stenosis from 100% to 30% with restoration of TIMI-3 flow.  Stent was not placed  due to the small vessel size with diffuse disease extending to the ostium.  Plan: 1.  Dual antiplatelet therapy for at least 1 month.  Given that patient is on warfarin as well, would recommend switching from ticagrelor (loaded in cath lab) to clopidogrel to reduce risk for bleeding complications. 2.  Restart warfarin tomorrow. 3.  Aggressive secondary prevention. 4.  Continue IV hydration x 6 hours, with close follow-up of renal status.      Assessment & Plan  1. Chest discomfort/interscapular pain concerning for angina: Presents with interscapular pain and dyspnea that began at 4 am this morning when he was awakened with extreme dyspnea. His review of the account sounds consistent with PND. Of note, when he presented earlier this month with unstable angina he described his angina and chest pressure with radiation to his arms and throat. His symptoms today are not his usual anginal equivalent. However, he very recently had POBA to his OM2. So it is best to admit him and observe further.   Will cycle troponin to rule out NSTEMI. Consider re look cath if he rules in, however he is on coumadin. His INR is 2.53 today. We will hold his coumadin, check INR in the am. If he has positive enzymes will place on heparin once INR <2.   Will also place patient on 30mg  isosorbide. He has not previously been on this and would likely benefit him as he has small vessel disease and is prone to angina.   2. Acute on chronic diastolic CHF: The description of his dyspnea this am is consistent with PND. He denies orthopnea, does appear slightly volume overloaded on exam with protuberant, firm abdomen. No JVD noted, however his body habitus prevents a thorough assessment of JVD.    His weight on 10/17/15 was 208 pounds at the office, weight on 11/01/15 was 214  pounds. Will diurese with 20mg  IV Lasix as he is lasix naive. Will get daily weights and strict I&O's. Continue ACE-I.   3. PAF: Likely permanent atrial fib now. He is rate controlled on beta blocker and diltiazem. He tells me that he has felt fatigued since his diltiazem was increased from 180mg  to 240mg  earlier this month. Its hard to tell if it is the diltiazem that caused this or the fact that his OM2 was totally occluded. However, with the addition of isosorbide it would be ok to decrease diltiazem to his previous dose of 180mg  daily and evaluate his HR while here in the hospital.   4. HTN: Well controlled on current regimen. See discussion above.   5. HLD: On 10mg  Crestor, has previously been intolerant of some statins. Could also be contributing to his fatigue. He denies myalgias. Keep on Crestor for now and monitor given his recent intervention.   6. OSA: Compliant with CPAP. RV only mildly dilated. Will place orders for CPAP while in hospital.    Signed, Arbutus Leas, NP 11/08/2015, 11:15 AM Pager: [phone]   I have seen, examined and evaluated the patient this AM along with Ms. Tamala Julian, NP.  After reviewing all the available data and chart, we discussed the patients laboratory, study & physical findings as well as symptoms in detail. I agree with her findings, examination as well as impression recommendations as per our discussion - her assessment and plan basically reflects my recommendations.    [name] is very pleasant gentleman recently here for ACS/unstable angina.  On August 10, he underwent PTCA/POBA (plain old balloon angioplasty) of a small  OM branch for unstable angina. At that time he had substernal pressure and tightness/squeezing radiating to his jaw and both arms. He did well at that time. He also has chronic atrial fibrillation for which he is on diltiazem. His diltiazem dose was recently increased by Dr. Burt Knack for additional rate control, however since then he's been  noticing more fatigue and symptoms that would suggest chronotropic incompetence.  Otherwise using well since his angioplasty 2 weeks ago until this morning when he woke up with acute onset shortness of breath associated with interscapular pain as well as mild chest pressure that has been intermittent. Mostly the pain is now in his left shoulder blade.. This was not really relieved with nitroglycerin. He is no longer short of breath.   Exam is relatively benign with no acute findings. EKG shows A. fib with no acute findings. Initial troponin is negative.   My suspicion is that this symptom is not anginal in nature, although there are some similarities, the most part is not like his angina symptoms 2 weeks ago. He didn't describe intrascapular pain at that time. However we cannot exclude unstable angina, therefore we will admit under observation status to rule out MI.  I will add low-dose Imdur for possible coronary spasm related to his angioplasty. He will contact stay on aspirin and Plavix plus the Coumadin. Holding Coumadin until we determine whether or not he rules in or out for MI. Depending on how he feels tomorrow we would either consider discharge versus further testing. His INR is currently 2.6 which is in therapeutic range, and he would therefore I would prefer not to consider catheterization -- especially in light of fact I don't think this is true anginal symptoms.  He has had about a 7 pound weight gain since his last clinic visit which corresponded with his hospitalization, and has noted some abdominal girth increase as well as some orthopnea. We'll start him on low-dose diuretic as I am concerned this may be the main etiology of symptoms being acute diastolic heart failure.  Reducing diltiazem dose to allow for adding Imdur. If his heart rate is elevated consider digoxin for additional rate control.  His been having some myalgias with statin. For now would continue, but would defer to  his primary cardiologist decide whether or not he would reduce the dose.    Glenetta Hew, M.D., M.S. Interventional Cardiologist   Pager # [phone] Phone # [phone] [address]. [place], [place] [address]

## 2015-11-08 NOTE — ED Provider Notes (Signed)
[facility] DEPT Provider Note   CSN: [id number] Arrival date & time: 11/08/15  0800     History   Chief Complaint Chief Complaint  Patient presents with  . Chest Pain    HPI [name] is a 82 y.o. male.  HPI Patient presents with acute onset shortness of breath and chest tightness waking him from sleep around 5:30 AM. Also describes aching in his right shoulder which is now moved to his left shoulder. EMS was called and transported patient. Was given aspirin and nitroglycerin. Chest tightness has completely resolved. No shortness of breath. Complains only of mild left shoulder pain which she describes as needed. Recent hospitalization with cath and balloon angioplasty but no stent placement. Patient states he's been compliant with medications. Past Medical History:  Diagnosis Date  . Bradycardia    a. nocturnal with pauses overnight on tele likely due to OSA 10/2015.  [name] CKD (chronic kidney disease), stage III   . Coronary artery disease    a. prev nonobst. b. LHC 10/24/15: occlusion of small diffusely diseased OM2 s/p balloon angioplasty, mild nonobstructive disease of mLAD and mRCA, normal LVEDP  . Diverticulosis   . Esophageal reflux   . Esophageal stricture from GERD 06/08/2011   With stricture at GE junction on EGD, dilated 06/2011   . Gastric ulcer    on EGD 2013  . Gout, unspecified   . Hyperlipidemia   . Hypertension   . Obesity, unspecified   . Orthopnea   . OSA on CPAP   . Paroxysmal nocturnal dyspnea   . Persistent atrial fibrillation ([place])   . Vertigo     Patient Active Problem List   Diagnosis Date Noted  . Atypical angina ([place]) 11/08/2015  . Acute diastolic heart failure ([place])   . CAD S/P percutaneous coronary angioplasty   . Chronic atrial fibrillation ([place])   . Unstable angina ([place]) 10/25/2015  . CKD (chronic kidney disease), stage III   . Persistent atrial fibrillation ([place])   . Hypertension   . Hyperlipidemia   . Bradycardia   . Glucose  intolerance (impaired glucose tolerance) 10/22/2015  . Back pain 05/24/2015  . Triceps tendonitis 05/24/2015  . ETD (eustachian tube dysfunction) 05/24/2015  . Hyperglycemia 12/18/2014  . Dysphagia, pharyngoesophageal phase 09/25/2014  . History of esophageal stricture 09/25/2014  . Chronic anticoagulation 09/25/2014  . Helicobacter pylori duodenitis 09/25/2014  . Benign paroxysmal positional vertigo 03/14/2014  . Encounter for therapeutic drug monitoring 04/12/2013  . Esophageal stricture from GERD 06/08/2011  . OSA on CPAP 09/22/2010  . HYPERCHOLESTEROLEMIA 08/15/2008  . Gout 08/15/2008  . OBESITY 08/15/2008  . Essential hypertension 08/15/2008  . CAD (coronary artery disease), native coronary artery 08/15/2008  . GASTROESOPHAGEAL REFLUX DISEASE 08/15/2008    Past Surgical History:  Procedure Laterality Date  . BALLOON DILATION N/A 10/22/2014   Procedure: BALLOON DILATION;  Surgeon: [name], MD;  Location: [facility];  Service: Endoscopy;  Laterality: N/A;  . CARDIAC CATHETERIZATION  07/2008  . CARDIAC CATHETERIZATION N/A 10/24/2015   Procedure: Left Heart Cath and Coronary Angiography;  Surgeon: [name], MD;  Location: [facility];  Service: Cardiovascular;  Laterality: N/A;  . CARDIAC CATHETERIZATION N/A 10/24/2015   Procedure: Coronary Balloon Angioplasty;  Surgeon: [name], MD;  Location: [facility];  Service: Cardiovascular;  Laterality: N/A;  . Carotid Dopplers  09/2009   no sig extracranial stenosis and vertebral arteries had antegrade flow  . CATARACT EXTRACTION W/ INTRAOCULAR LENS  IMPLANT,  BILATERAL Bilateral   . ESOPHAGOGASTRODUODENOSCOPY (EGD) WITH ESOPHAGEAL DILATION     "I've had it stretched 3 times" (10/23/2015)  . ESOPHAGOGASTRODUODENOSCOPY (EGD) WITH PROPOFOL N/A 10/22/2014   Procedure: ESOPHAGOGASTRODUODENOSCOPY (EGD) WITH PROPOFOL;  Surgeon: [name], MD;  Location: [facility];  Service: Endoscopy;  Laterality: N/A;  .  MRI of brain  09/2009   chronic microvascular ischemia  . OSA Sleep Study     per Dr. Brett Fairy  . SHOULDER ARTHROSCOPY W/ ROTATOR CUFF REPAIR Right 2008       Home Medications    Prior to Admission medications   Medication Sig Start Date End Date Taking? Authorizing Provider  allopurinol (ZYLOPRIM) 300 MG tablet Take 1 tablet (300 mg total) by mouth daily. 05/24/15  Yes Tonia Ghent, MD  aspirin 81 MG chewable tablet Chew 1 tablet (81 mg total) by mouth daily. 10/25/15  Yes Kinnie Feil, MD  clopidogrel (PLAVIX) 75 MG tablet Take 1 tablet (75 mg total) by mouth daily. 10/26/15  Yes Kinnie Feil, MD  colchicine (COLCRYS) 0.6 MG tablet Take 0.6 mg by mouth daily as needed (for gout).    Yes Historical Provider, MD  diltiazem (CARDIZEM CD) 240 MG 24 hr capsule Take 1 capsule (240 mg total) by mouth daily. 10/17/15  Yes Sherren Mocha, MD  famotidine (PEPCID) 20 MG tablet Take 20 mg by mouth daily.    Yes Historical Provider, MD  fluticasone (FLONASE) 50 MCG/ACT nasal spray Place 1-2 sprays into both nostrils daily as needed for allergies.    Yes Historical Provider, MD  lisinopril (PRINIVIL,ZESTRIL) 40 MG tablet Take 1 tablet (40 mg total) by mouth daily. 10/28/15  Yes Sherren Mocha, MD  metoprolol tartrate (LOPRESSOR) 25 MG tablet TAKE 1/2 TABLET BY MOUTH TWICE A DAY Patient taking differently: Take 12.5 mg by mouth twice daily 02/06/15  Yes Sherren Mocha, MD  rosuvastatin (CRESTOR) 10 MG tablet Take 1 tablet (10 mg total) by mouth daily at 6 PM. 10/25/15  Yes Kinnie Feil, MD  warfarin (COUMADIN) 4 MG tablet Take 2-4 mg by mouth daily. Take 6 mg Sunday, Tuesday and Thursday. Take 4 mg by mouth on all other days.   Yes Historical Provider, MD    Family History Family History  Problem Relation Age of Onset  . Stomach cancer Mother     died in 44's   . Hypertension Father     died in his 12's pna likely dementia  . Dementia Father   . Pneumonia Father   . Heart disease  Brother     S/P CABG  . Hypertension Other   . Prostate cancer Neg Hx   . Colon cancer Neg Hx     Social History Social History  Substance Use Topics  . Smoking status: Former Smoker    Packs/day: 0.12    Years: 4.00    Types: Cigarettes    Quit date: 03/16/1969  . Smokeless tobacco: Never Used     Comment: 1 pack/week for about 4 years, quit 1971  . Alcohol use No     Comment: 10/23/2015 "Quit all alcohol in 1970's"     Allergies   Penicillins; Atorvastatin; Biaxin [clarithromycin]; and Doxycycline   Review of Systems Review of Systems  Constitutional: Negative for chills and fever.  Respiratory: Positive for chest tightness and shortness of breath. Negative for cough.   Cardiovascular: Negative for chest pain.  Gastrointestinal: Positive for nausea. Negative for abdominal pain, diarrhea and vomiting.  Genitourinary: Negative for  dysuria, flank pain and frequency.  Musculoskeletal: Negative for back pain, myalgias, neck pain and neck stiffness.  Skin: Negative for rash and wound.  Neurological: Negative for dizziness, weakness, light-headedness, numbness and headaches.  All other systems reviewed and are negative.    Physical Exam Updated Vital Signs BP 130/69 (BP Location: Left Arm)   Pulse 74   Temp 98.2 F (36.8 C) (Oral)   Resp 16   Ht 5\' 6"  (1.676 m)   Wt 213 lb 14.4 oz (97 kg)   SpO2 98%   BMI 34.52 kg/m   Physical Exam  Constitutional: He is oriented to person, place, and time. He appears well-developed and well-nourished. No distress.  HENT:  Head: Normocephalic and atraumatic.  Mouth/Throat: Oropharynx is clear and moist.  Eyes: EOM are normal. Pupils are equal, round, and reactive to light.  Neck: Normal range of motion. Neck supple.  Cardiovascular: Regular rhythm.   Irregularly irregular  Pulmonary/Chest: Effort normal and breath sounds normal. No respiratory distress. He has no wheezes. He has no rales. He exhibits no tenderness.  Abdominal:  Soft. Bowel sounds are normal. There is no tenderness. There is no rebound and no guarding.  Musculoskeletal: Normal range of motion. He exhibits no edema or tenderness.  No lower extremity swelling, asymmetry or tenderness. Full range of motion of bilateral shoulders.  No bony or muscular tenderness. Distal pulses are equal in all extremities  Neurological: He is alert and oriented to person, place, and time.  Moves all extremities without deficit. Sensation is fully intact.  Skin: Skin is warm and dry. Capillary refill takes less than 2 seconds. No rash noted. No erythema.  Psychiatric: He has a normal mood and affect. His behavior is normal.  Nursing note and vitals reviewed.    ED Treatments / Results  Labs (all labs ordered are listed, but only abnormal results are displayed) Labs Reviewed  COMPREHENSIVE METABOLIC PANEL - Abnormal; Notable for the following:       Result Value   CO2 21 (*)    Glucose, Bld 132 (*)    BUN 21 (*)    Creatinine, Ser 1.41 (*)    GFR calc non Af Amer 45 (*)    GFR calc Af Amer 52 (*)    All other components within normal limits  BRAIN NATRIURETIC PEPTIDE - Abnormal; Notable for the following:    B Natriuretic Peptide 121.1 (*)    All other components within normal limits  PROTIME-INR - Abnormal; Notable for the following:    Prothrombin Time 27.8 (*)    All other components within normal limits  CBC WITH DIFFERENTIAL/PLATELET  TROPONIN I  TROPONIN I  TROPONIN I  I-STAT TROPOININ, ED    EKG  EKG Interpretation  Date/Time:  Friday November 08 2015 08:10:02 EDT Ventricular Rate:  79 PR Interval:    QRS Duration: 91 QT Interval:  389 QTC Calculation: 446 R Axis:   30 Text Interpretation:  Atrial fibrillation Confirmed by Lita Mains  MD, Denny Lave ([id number]) on 11/08/2015 8:14:23 AM Also confirmed by Lita Mains  MD, Otha Monical ([id number]), editor [place], Joelene Millin [phone])  on 11/08/2015 9:29:55 AM       Radiology Dg Chest Port 1 View  Result Date:  11/08/2015 CLINICAL DATA:  Acute onset chest pain and shortness of breath this morning. Coronary artery disease. EXAM: PORTABLE CHEST 1 VIEW COMPARISON:  10/21/2015 FINDINGS: The heart size and mediastinal contours are within normal limits. No evidence of pulmonary infiltrate or edema. No evidence of pleural  effusion. Numerous tiny calcified granulomata are again seen in both lungs, consistent with old granulomatous disease. IMPRESSION: No active disease. Electronically Signed   By: Earle Gell M.D.   On: 11/08/2015 09:32    Procedures Procedures (including critical care time)  Medications Ordered in ED Medications  lisinopril (PRINIVIL,ZESTRIL) tablet 40 mg (40 mg Oral Given 11/08/15 1400)  famotidine (PEPCID) tablet 20 mg (20 mg Oral Given 11/08/15 1400)  aspirin chewable tablet 81 mg (not administered)  clopidogrel (PLAVIX) tablet 75 mg (75 mg Oral Given 11/08/15 1400)  rosuvastatin (CRESTOR) tablet 10 mg (not administered)  allopurinol (ZYLOPRIM) tablet 300 mg (300 mg Oral Given 11/08/15 1400)  metoprolol tartrate (LOPRESSOR) tablet 12.5 mg (12.5 mg Oral Given 11/08/15 1400)  nitroGLYCERIN (NITROSTAT) SL tablet 0.4 mg (not administered)  acetaminophen (TYLENOL) tablet 650 mg (not administered)  ondansetron (ZOFRAN) injection 4 mg (not administered)  furosemide (LASIX) injection 20 mg (not administered)  diltiazem (CARDIZEM CD) 24 hr capsule 180 mg (180 mg Oral Given 11/08/15 1400)  isosorbide mononitrate (IMDUR) 24 hr tablet 30 mg (30 mg Oral Given 11/08/15 1400)     Initial Impression / Assessment and Plan / ED Course  I have reviewed the triage vital signs and the nursing notes.  Pertinent labs & imaging results that were available during my care of the patient were reviewed by me and considered in my medical decision making (see chart for details).  Clinical Course      Final Clinical Impressions(s) / ED Diagnoses   Final diagnoses:  None  Initial troponin is normal. EKG appears  to be unchanged. Discussed with cardiology and will see patient in emergency department. Cardiology to to admit to observation bed. New Prescriptions Current Discharge Medication List       Julianne Rice, MD 11/08/15 [phone]

## 2015-11-09 DIAGNOSIS — Z79899 Other long term (current) drug therapy: Secondary | ICD-10-CM | POA: Diagnosis not present

## 2015-11-09 DIAGNOSIS — R079 Chest pain, unspecified: Secondary | ICD-10-CM | POA: Diagnosis not present

## 2015-11-09 DIAGNOSIS — I130 Hypertensive heart and chronic kidney disease with heart failure and stage 1 through stage 4 chronic kidney disease, or unspecified chronic kidney disease: Secondary | ICD-10-CM | POA: Diagnosis not present

## 2015-11-09 DIAGNOSIS — Z7902 Long term (current) use of antithrombotics/antiplatelets: Secondary | ICD-10-CM | POA: Diagnosis not present

## 2015-11-09 DIAGNOSIS — I2510 Atherosclerotic heart disease of native coronary artery without angina pectoris: Secondary | ICD-10-CM | POA: Diagnosis not present

## 2015-11-09 DIAGNOSIS — I5031 Acute diastolic (congestive) heart failure: Secondary | ICD-10-CM | POA: Diagnosis not present

## 2015-11-09 DIAGNOSIS — N183 Chronic kidney disease, stage 3 (moderate): Secondary | ICD-10-CM | POA: Diagnosis not present

## 2015-11-09 DIAGNOSIS — R0789 Other chest pain: Secondary | ICD-10-CM | POA: Diagnosis not present

## 2015-11-09 DIAGNOSIS — Z87891 Personal history of nicotine dependence: Secondary | ICD-10-CM | POA: Diagnosis not present

## 2015-11-09 DIAGNOSIS — Z7901 Long term (current) use of anticoagulants: Secondary | ICD-10-CM | POA: Diagnosis not present

## 2015-11-09 DIAGNOSIS — Z7982 Long term (current) use of aspirin: Secondary | ICD-10-CM | POA: Diagnosis not present

## 2015-11-09 LAB — CBC
HEMATOCRIT: 42.5 % (ref 39.0–52.0)
HEMOGLOBIN: 14 g/dL (ref 13.0–17.0)
MCH: 29.7 pg (ref 26.0–34.0)
MCHC: 32.9 g/dL (ref 30.0–36.0)
MCV: 90.2 fL (ref 78.0–100.0)
Platelets: 168 10*3/uL (ref 150–400)
RBC: 4.71 MIL/uL (ref 4.22–5.81)
RDW: 14.4 % (ref 11.5–15.5)
WBC: 6.2 10*3/uL (ref 4.0–10.5)

## 2015-11-09 LAB — BASIC METABOLIC PANEL
ANION GAP: 8 (ref 5–15)
BUN: 28 mg/dL — ABNORMAL HIGH (ref 6–20)
CALCIUM: 9 mg/dL (ref 8.9–10.3)
CHLORIDE: 103 mmol/L (ref 101–111)
CO2: 26 mmol/L (ref 22–32)
Creatinine, Ser: 1.63 mg/dL — ABNORMAL HIGH (ref 0.61–1.24)
GFR calc non Af Amer: 38 mL/min — ABNORMAL LOW (ref >60)
GFR, EST AFRICAN AMERICAN: 44 mL/min — AB (ref >60)
GLUCOSE: 132 mg/dL — AB (ref 65–99)
POTASSIUM: 4.3 mmol/L (ref 3.5–5.1)
Sodium: 137 mmol/L (ref 135–145)

## 2015-11-09 LAB — PROTIME-INR
INR: 2.2
PROTHROMBIN TIME: 24.8 s — AB (ref 11.4–15.2)

## 2015-11-09 LAB — TROPONIN I: Troponin I: <0.03 ng/mL (ref <0.03)

## 2015-11-09 MED ORDER — ISOSORBIDE MONONITRATE ER 30 MG PO TB24: 30.0000 mg | ORAL_TABLET | Freq: Every day | ORAL | 5 refills | Status: DC

## 2015-11-09 MED ORDER — WARFARIN SODIUM 4 MG PO TABS: 4.0000 mg | ORAL_TABLET | Freq: Once | ORAL | Status: DC

## 2015-11-09 MED ORDER — DILTIAZEM HCL ER COATED BEADS 180 MG PO CP24: 180.0000 mg | ORAL_CAPSULE | Freq: Every day | ORAL | 5 refills | Status: DC

## 2015-11-09 MED ORDER — FUROSEMIDE 20 MG PO TABS: 20.0000 mg | ORAL_TABLET | Freq: Every day | ORAL | 5 refills | Status: DC

## 2015-11-09 MED ORDER — NITROGLYCERIN 0.4 MG SL SUBL: 0.4000 mg | SUBLINGUAL_TABLET | SUBLINGUAL | 2 refills | Status: DC | PRN

## 2015-11-09 MED ORDER — FUROSEMIDE 20 MG PO TABS
20.0000 mg | ORAL_TABLET | Freq: Every day | ORAL | Status: DC
  Administered 2015-11-09: 20 mg via ORAL
  Filled 2015-11-09: qty 1

## 2015-11-09 MED ORDER — WARFARIN - PHARMACIST DOSING INPATIENT: Freq: Every day | Status: DC

## 2015-11-09 NOTE — Plan of Care (Signed)
Problem: Phase I Progression Outcomes Goal: MD aware of Cardiac Marker results Outcome: Completed/Met Date Met: 11/09/15 Troponins have been negative x3

## 2015-11-09 NOTE — Progress Notes (Signed)
Subjective:  No chest discomfort or shoulder pain today.  Dyspnea has improved.  EKG and troponins are normal.  He presented yesterday with dyspnea that awakened him as well as bilateral shoulder aching.  The right shoulder quit aching after about an hour but the left shoulder aches most of the day yesterday.  The symptoms were different than the previous symptoms when he had his PCI.  Objective:  Vital Signs in the last 24 hours: BP 114/66 (BP Location: Right Arm)   Pulse 62   Temp 98 F (36.7 C) (Oral)   Resp 18   Ht 5\' 6"  (1.676 m)   Wt 97.4 kg (214 lb 11.2 oz)   SpO2 97%   BMI 34.65 kg/m   Physical Exam: Pleasant severely obese male in no acute distress Lungs:  Clear  Cardiac:  Irregular rhythm, normal S1 and S2, no S3 Abdomen:  Quite large  Extremities:  No edema present  Intake/Output from previous day: 08/25 0701 - 08/26 0700 In: 240 [P.O.:240] Out: -  Weight Filed Weights   11/08/15 0806 11/08/15 1236 11/09/15 0504  Weight: 93 kg (205 lb) 97 kg (213 lb 14.4 oz) 97.4 kg (214 lb 11.2 oz)    Lab Results: Basic Metabolic Panel:  Recent Labs  11/08/15 0836 11/09/15 0106  NA 136 137  K 4.6 4.3  CL 104 103  CO2 21* 26  GLUCOSE 132* 132*  BUN 21* 28*  CREATININE 1.41* 1.63*    CBC:  Recent Labs  11/08/15 0836 11/09/15 0106  WBC 6.7 6.2  NEUTROABS 4.6  --   HGB 14.7 14.0  HCT 44.1 42.5  MCV 89.3 90.2  PLT 165 168    BNP    Component Value Date/Time   BNP 121.1 (H) 11/08/2015 0836    Cardiac Panel (last 3 results)  Recent Labs  11/08/15 1245 11/08/15 1835 11/09/15 0106  TROPONINI <0.03 <0.03 <0.03    Telemetry: Atrial fibrillation with controlled response  Assessment/Plan:  1.  Dr. Allison Quarry note from yesterday read and I took additional history today.  The discomfort and presentation did not sound like an acute coronary syndrome and his enzymes are negative.  BNP was mildly elevated.  Possible that he had some mild diastolic  dysfunction yesterday.  I doubt this was an ischemic event.  Recommended that he get up and ambulate this morning and go ahead and eat today.  If he remains stable I think he could be discharged on a low dose of furosemide 20 mg daily.  Continue his other therapy. 2.  Coronary artery disease with recent PCI of the second OM 3.  Severe obesity 4.  Atrial fibrillation rate controlled 5.  Long-term use of anticoagulation with warfarin  Recommendations:  Get up and ambulate after eating breakfast.  If remains stable possible discharge later today.     Kerry Hough  MD [facility] Cardiology  11/09/2015, 8:24 AM

## 2015-11-09 NOTE — Discharge Summary (Signed)
Discharge Summary    Patient ID: [name],  MRN: [id number], DOB/AGE: [birth year] 80 y.o.  Admit date: 11/08/2015 Discharge date: 11/09/2015  Primary Care Provider: Elsie [name] Primary Cardiologist: Dr. Burt [name]  Discharge Diagnoses    Active Problems:   Atypical angina [facility])   Acute diastolic heart failure ([facility])   CAD S/P percutaneous coronary angioplasty   Chronic atrial fibrillation ([facility])   Allergies Allergies  Allergen Reactions  . Penicillins Shortness Of Breath and Swelling    Has patient had a PCN reaction causing immediate rash, facial/tongue/throat swelling, SOB or lightheadedness with hypotensio: YES Has patient had a PCN reaction causing severe rash involving mucus membranes or skin necrosis: NO Has patient had a PCN reaction that required hospitalization NO Has patient had a PCN reaction occurring within the last 10 years: NO If all of the above answers are "NO", then may proceed with Cephalosporin use.  . Atorvastatin Other (See Comments)    Intolerant   . Biaxin [Clarithromycin] Diarrhea  . Doxycycline Other (See Comments)    GI upset    Diagnostic Studies/Procedures    Cardiac Panel (last 3 results)  Recent Labs  11/08/15 1245 11/08/15 1835 11/09/15 0106  TROPONINI <0.03 <0.03 <0.03    History of Present Illness     [name] is a 82 year old male with a past medical history of CAD, OSA, paroxsymal Afib, HTN, HLD, and obesity. He recently had POBA of his OM2 on 10/24/15. He presents to the ED on 11/08/15 with interscapular pain and SOB. ED EKG unremarkable. POC troponin negative. Patient admitted for r/o.   Hospital Course     Patient was admitted to telemetry. His right shoulder pain persisted for about an hour however his left shoulder ached most of the day. Cardiac enzymes were cycled x 3 and were negative. He ruled out for MI. Given prolonged episode of pain and negative enzymes, it was felt that his symptoms were not secondary to ACS.  In addition, he had noted that his pain was different from his previous symptoms prior to his PCI. There was ? Of possible coronary vasospasm. Subsequently, his Cardizem was reduced from 240 mg to 180 mg daily to allow BP room to start Imdur, 30 mg.   The following day, he was examined and was w/o CP. It was felt that he may have had a bit of mid diastolic dysfunction that may have also contributed to his symptoms. His BNP was slightly abnormal at 121. Low dose furosemide, 20 mg daily was recommended. He was last seen and examined by Dr. Wynonia [name] who determined he was stable for d/c home. His home meds were continued, including triple therapy with ASA, Plavix and Warfarin, given recent PCI. He is to continue this for a duration of 30 days from his recent PCI. He will f/u with Dr. Burt [name] or APP in 1-2 weeks.   Consultants: none   Discharge Vitals Blood pressure (!) 120/48, pulse 62, temperature 98.1 F (36.7 C), temperature source Oral, resp. rate 18, height 5\' 6"  (1.676 m), weight 214 lb 11.2 oz (97.4 kg), SpO2 98 %.  Filed Weights   11/08/15 0806 11/08/15 1236 11/09/15 0504  Weight: 205 lb (93 kg) 213 lb 14.4 oz (97 kg) 214 lb 11.2 oz (97.4 kg)    Labs & Radiologic Studies    CBC  Recent Labs  11/08/15 0836 11/09/15 0106  WBC 6.7 6.2  NEUTROABS 4.6  --   HGB 14.7 14.0  HCT  44.1 42.5  MCV 89.3 90.2  PLT 165 [id number]   Basic Metabolic Panel  Recent Labs  11/08/15 0836 11/09/15 0106  NA 136 137  K 4.6 4.3  CL 104 103  CO2 21* 26  GLUCOSE 132* 132*  BUN 21* 28*  CREATININE 1.41* 1.63*  CALCIUM 9.0 9.0   Liver Function Tests  Recent Labs  11/08/15 0836  AST 26  ALT 29  ALKPHOS 65  BILITOT 0.8  PROT 6.8  ALBUMIN 3.8   No results for input(s): LIPASE, AMYLASE in the last 72 hours. Cardiac Enzymes  Recent Labs  11/08/15 1245 11/08/15 1835 11/09/15 0106  TROPONINI <0.03 <0.03 <0.03   BNP Invalid input(s): POCBNP D-Dimer No results for input(s): DDIMER in the  last 72 hours. Hemoglobin A1C No results for input(s): HGBA1C in the last 72 hours. Fasting Lipid Panel No results for input(s): CHOL, HDL, LDLCALC, TRIG, CHOLHDL, LDLDIRECT in the last 72 hours. Thyroid Function Tests No results for input(s): TSH, T4TOTAL, T3FREE, THYROIDAB in the last 72 hours.  Invalid input(s): FREET3 _____________  Dg Chest 2 View  Result Date: 10/21/2015 CLINICAL DATA:  Chest pain and shortness of breath since Friday. Pain radiates through bilateral arms. EXAM: CHEST  2 VIEW COMPARISON:  09/22/2009 FINDINGS: Heart size is normal. Small calcified pulmonary nodules are consistent with prior granulomatous disease. Calcifications are identified within the mediastinal lymph nodes. There are no focal consolidations or pleural effusions. IMPRESSION: No evidence for acute Prior granulomatous disease. Abnormality. Electronically Signed   By: Nolon Nations M.D.   On: 10/21/2015 19:25   Dg Chest Port 1 View  Result Date: 11/08/2015 CLINICAL DATA:  Acute onset chest pain and shortness of breath this morning. Coronary artery disease. EXAM: PORTABLE CHEST 1 VIEW COMPARISON:  10/21/2015 FINDINGS: The heart size and mediastinal contours are within normal limits. No evidence of pulmonary infiltrate or edema. No evidence of pleural effusion. Numerous tiny calcified granulomata are again seen in both lungs, consistent with old granulomatous disease. IMPRESSION: No active disease. Electronically Signed   By: Earle Gell M.D.   On: 11/08/2015 09:32   Disposition   Pt is being discharged home today in good condition.  Follow-up Plans & Appointments    Follow-up Information    Nathan Mocha, MD .   Specialty:  Cardiology Why:  our office will call you with post hospital follow-up  Contact information: [address] [place] [address] [phone]          Discharge Instructions    Diet - low sodium heart healthy    Complete by:  As directed   Diet -  low sodium heart healthy    Complete by:  As directed   Diet - low sodium heart healthy    Complete by:  As directed   Increase activity slowly    Complete by:  As directed   Increase activity slowly    Complete by:  As directed   Increase activity slowly    Complete by:  As directed      Discharge Medications   Current Discharge Medication List    START taking these medications   Details  furosemide (LASIX) 20 MG tablet Take 1 tablet (20 mg total) by mouth daily. Qty: 30 tablet, Refills: 5    isosorbide mononitrate (IMDUR) 30 MG 24 hr tablet Take 1 tablet (30 mg total) by mouth daily. Qty: 30 tablet, Refills: 5    nitroGLYCERIN (NITROSTAT) 0.4 MG SL tablet Place 1  tablet (0.4 mg total) under the tongue every 5 (five) minutes x 3 doses as needed for chest pain. Qty: 25 tablet, Refills: 2      CONTINUE these medications which have CHANGED   Details  diltiazem (CARDIZEM CD) 180 MG 24 hr capsule Take 1 capsule (180 mg total) by mouth daily. Qty: 30 capsule, Refills: 5      CONTINUE these medications which have NOT CHANGED   Details  allopurinol (ZYLOPRIM) 300 MG tablet Take 1 tablet (300 mg total) by mouth daily.    aspirin 81 MG chewable tablet Chew 1 tablet (81 mg total) by mouth daily. Qty: 30 tablet, Refills: 0    clopidogrel (PLAVIX) 75 MG tablet Take 1 tablet (75 mg total) by mouth daily. Qty: 30 tablet, Refills: 0    colchicine (COLCRYS) 0.6 MG tablet Take 0.6 mg by mouth daily as needed (for gout).     famotidine (PEPCID) 20 MG tablet Take 20 mg by mouth daily.     fluticasone (FLONASE) 50 MCG/ACT nasal spray Place 1-2 sprays into both nostrils daily as needed for allergies.     lisinopril (PRINIVIL,ZESTRIL) 40 MG tablet Take 1 tablet (40 mg total) by mouth daily. Qty: 90 tablet, Refills: 2    metoprolol tartrate (LOPRESSOR) 25 MG tablet TAKE 1/2 TABLET BY MOUTH TWICE A DAY Qty: 30 tablet, Refills: 9    rosuvastatin (CRESTOR) 10 MG tablet Take 1 tablet (10 mg  total) by mouth daily at 6 PM. Qty: 30 tablet, Refills: 0    warfarin (COUMADIN) 4 MG tablet Take 2-4 mg by mouth daily. Take 6 mg Sunday, Tuesday and Thursday. Take 4 mg by mouth on all other days.         Outstanding Labs/Studies    Duration of Discharge Encounter   Greater than 30 minutes including physician time.  Signed, Lyda Jester PA-C 11/09/2015, 10:52 AM

## 2015-11-09 NOTE — Progress Notes (Signed)
ANTICOAGULATION CONSULT NOTE - Initial Consult  Pharmacy Consult for warfarin Indication: atrial fibrillation  Allergies  Allergen Reactions  . Penicillins Shortness Of Breath and Swelling    Has patient had a PCN reaction causing immediate rash, facial/tongue/throat swelling, SOB or lightheadedness with hypotensio: YES Has patient had a PCN reaction causing severe rash involving mucus membranes or skin necrosis: NO Has patient had a PCN reaction that required hospitalization NO Has patient had a PCN reaction occurring within the last 10 years: NO If all of the above answers are "NO", then may proceed with Cephalosporin use.  . Atorvastatin Other (See Comments)    Intolerant   . Biaxin [Clarithromycin] Diarrhea  . Doxycycline Other (See Comments)    GI upset    Patient Measurements: Height: 5\' 6"  (167.6 cm) Weight: 214 lb 11.2 oz (97.4 kg) IBW/kg (Calculated) : 63.8  Vital Signs: Temp: 98 F (36.7 C) (08/26 0759) Temp Source: Oral (08/26 0759) BP: 114/66 (08/26 0759) Pulse Rate: 62 (08/26 0759)  Labs:  Recent Labs  11/08/15 0836 11/08/15 1245 11/08/15 1835 11/09/15 0106  HGB 14.7  --   --  14.0  HCT 44.1  --   --  42.5  PLT 165  --   --  168  LABPROT 27.8*  --   --  24.8*  INR 2.53  --   --  2.20  CREATININE 1.41*  --   --  1.63*  TROPONINI  --  <0.03 <0.03 <0.03    Estimated Creatinine Clearance: 38.2 mL/min (by C-G formula based on SCr of 1.63 mg/dL).   Medical History: Past Medical History:  Diagnosis Date  . Bradycardia    a. nocturnal with pauses overnight on tele likely due to OSA 10/2015.  [name] CKD (chronic kidney disease), stage III   . Coronary artery disease    a. prev nonobst. b. LHC 10/24/15: occlusion of small diffusely diseased OM2 s/p balloon angioplasty, mild nonobstructive disease of mLAD and mRCA, normal LVEDP  . Diverticulosis   . Esophageal reflux   . Esophageal stricture from GERD 06/08/2011   With stricture at GE junction on EGD, dilated  06/2011   . Gastric ulcer    on EGD 2013  . Gout, unspecified   . Hyperlipidemia   . Hypertension   . Obesity, unspecified   . Orthopnea   . OSA on CPAP   . Paroxysmal nocturnal dyspnea   . Persistent atrial fibrillation ([place])   . Vertigo     Medications:  Scheduled:  . allopurinol  300 mg Oral Daily  . aspirin  81 mg Oral Daily  . clopidogrel  75 mg Oral Daily  . diltiazem  180 mg Oral Daily  . famotidine  20 mg Oral Daily  . furosemide  20 mg Oral Daily  . isosorbide mononitrate  30 mg Oral Daily  . lisinopril  40 mg Oral Daily  . metoprolol tartrate  12.5 mg Oral BID  . rosuvastatin  10 mg Oral q1800  . warfarin  4 mg Oral ONCE-1800  . Warfarin - Pharmacist Dosing Inpatient   Does not apply q1800    Assessment: GN is an 82 yo M who presented to the ED on 8/25 with interscapular pain and SOB. PMH significant for CAD, OSA, PAF, HTN, HLD, and obesity. Warfarin was on hold due to suspected NSTEMI/STEMI. Troponins were negative today so warfarin will be re-started.  PTA dose: 6 mg on Sunday, T, R. 4 mg all other days. Last dose 8/24.  INR was 2.53 on admission and 2.20 today after holding 1 dose. Will re-initiate home dosing regimen and monitor. CBC stable and no bleeding noted.   Goal of Therapy:  INR 2-3 Monitor platelets by anticoagulation protocol: Yes   Plan:  Warfarin 4 mg tonight X 1  Daily INR Monitor s/sx of bleeding.  F/U discharge plan  Pasty Spillers D Pharmacy Resident Pager: [phone] 11/09/2015,8:59 AM

## 2015-11-09 NOTE — Plan of Care (Signed)
Problem: Phase I Progression Outcomes Goal: Anginal pain relieved Outcome: Completed/Met Date Met: 11/09/15 Pt has not complained of any chest pain.

## 2015-11-11 ENCOUNTER — Telehealth: Payer: Self-pay | Admitting: Internal Medicine

## 2015-11-11 NOTE — Telephone Encounter (Signed)
Patient declines next available appt with MD in November and declines APP appt.  He reports abdominal pain and gas. Multiple recent admissions to the hospital .  He feels his symptoms are related to his stomach and not his heart.   He is advised to call back if he changes his mind.

## 2015-11-12 NOTE — Progress Notes (Signed)
Would stop plavix at 30 days (POBA) and continue low-dose ASA and warfarin. thx

## 2015-11-19 ENCOUNTER — Telehealth: Payer: Self-pay | Admitting: Internal Medicine

## 2015-11-19 NOTE — Telephone Encounter (Signed)
Left message for patient to call back  

## 2015-11-20 ENCOUNTER — Other Ambulatory Visit (INDEPENDENT_AMBULATORY_CARE_PROVIDER_SITE_OTHER): Payer: Medicare Other

## 2015-11-20 DIAGNOSIS — R7989 Other specified abnormal findings of blood chemistry: Secondary | ICD-10-CM

## 2015-11-20 DIAGNOSIS — R748 Abnormal levels of other serum enzymes: Secondary | ICD-10-CM

## 2015-11-20 LAB — BASIC METABOLIC PANEL
BUN: 33 mg/dL — AB (ref 6–23)
CO2: 26 mEq/L (ref 19–32)
CREATININE: 1.52 mg/dL — AB (ref 0.40–1.50)
Calcium: 9 mg/dL (ref 8.4–10.5)
Chloride: 103 mEq/L (ref 96–112)
GFR: 46.8 mL/min — AB (ref >60.00)
GLUCOSE: 123 mg/dL — AB (ref 70–99)
POTASSIUM: 4.8 meq/L (ref 3.5–5.1)
Sodium: 136 mEq/L (ref 135–145)

## 2015-11-21 ENCOUNTER — Ambulatory Visit (INDEPENDENT_AMBULATORY_CARE_PROVIDER_SITE_OTHER): Payer: Medicare Other | Admitting: Physician Assistant

## 2015-11-21 ENCOUNTER — Encounter: Payer: Self-pay | Admitting: Physician Assistant

## 2015-11-21 VITALS — BP 110/66 | HR 88 | Ht 66.0 in | Wt 212.0 lb

## 2015-11-21 DIAGNOSIS — K30 Functional dyspepsia: Secondary | ICD-10-CM

## 2015-11-21 DIAGNOSIS — R1013 Epigastric pain: Secondary | ICD-10-CM | POA: Diagnosis not present

## 2015-11-21 DIAGNOSIS — R140 Abdominal distension (gaseous): Secondary | ICD-10-CM

## 2015-11-21 DIAGNOSIS — Z9861 Coronary angioplasty status: Secondary | ICD-10-CM

## 2015-11-21 DIAGNOSIS — I2510 Atherosclerotic heart disease of native coronary artery without angina pectoris: Secondary | ICD-10-CM

## 2015-11-21 DIAGNOSIS — R1310 Dysphagia, unspecified: Secondary | ICD-10-CM

## 2015-11-21 NOTE — Telephone Encounter (Signed)
Patient will come in today for abdominal pain and see Nicoletta Ba PA

## 2015-11-21 NOTE — Progress Notes (Signed)
Await barium swallow and [place] Think he will need to change from H2B to PPI Agree with Ms. Genia Harold assessment and plan. [name], MD, [facility]

## 2015-11-21 NOTE — Patient Instructions (Addendum)
Increase Pepcid to 20 mg by mouth twice daily.  Use Gas X , one with each meal.    You have been scheduled for an abdominal ultrasound at [facility] (1st floor of hospital) on Wednesday 9-13 at 11:00 am. Please arrive at 10:45 am  to your appointment for registration. Make certain not to have anything to eat or drink 6 hours prior to your appointment. Should you need to reschedule your appointment, please contact radiology at [phone]. This test typically takes about 30 minutes to perform.  You have been scheduled for a Barium Esophogram at [facility] (1st floor of the hospital) on 9-13 at 03:54 am . Make certain not to have anything to eat or drink 6 hours prior to your test. If you need to reschedule for any reason, please contact radiology at [phone] to do so. __________________________________________________________________ A barium swallow is an examination that concentrates on views of the esophagus. This tends to be a double contrast exam (barium and two liquids which, when combined, create a gas to distend the wall of the oesophagus) or single contrast (non-ionic iodine based). The study is usually tailored to your symptoms so a good history is essential. Attention is paid during the study to the form, structure and configuration of the esophagus, looking for functional disorders (such as aspiration, dysphagia, achalasia, motility and reflux) EXAMINATION You may be asked to change into a gown, depending on the type of swallow being performed. A radiologist and radiographer will perform the procedure. The radiologist will advise you of the type of contrast selected for your procedure and direct you during the exam. You will be asked to stand, sit or lie in several different positions and to hold a small amount of fluid in your mouth before being asked to swallow while the imaging is performed .In some instances you may be asked to swallow barium coated marshmallows  to assess the motility of a solid food bolus. The exam can be recorded as a digital or video fluoroscopy procedure. POST PROCEDURE It will take 1-2 days for the barium to pass through your system. To facilitate this, it is important, unless otherwise directed, to increase your fluids for the next 24-48hrs and to resume your normal diet.  This test typically takes about 30 minutes to perform. __________________________________________________________________________________

## 2015-11-21 NOTE — Progress Notes (Signed)
Subjective:    Patient ID: [name], male    DOB: [birth year], 82 y.o.   MRN: [id number]  HPI Ervan is a pleasant 82 year old white male known to Dr. Carlean Purl. He has history of coronary artery disease, atrial fibrillation, obstructive sleep apnea, hypertension, hyperlipidemia, obesity and was diagnosed with gastric ulcer in 2013. Patient was last seen here about a year ago which time he underwent EGD with finding of stricture at the GE junction which was dilated to 18 mm by balloon. Is also noted to have a small stable nodule in the gastric antrum. Patient has had 2 recent hospital admissions in August 2017, initially with chest pain. He underwent catheterization with angioplasty to the OM 2. no stent was done due to the small vessel size. Since that time he has been on aspirin and Plavix and Coumadin. He  is followed by Dr. Burt Knack. He was readmitted on 11/08/2015 with upper shoulder pain and shortness of breath. He did not rule in for an MI, was felt that he may have had a component of vasospasm or perhaps mild exacerbation of CHF. His medications were adjusted and he was started on Imdur  He comes in here today stating that he has been having GI symptoms over the past couple of months. He says the symptoms started prior to any of his recent cardiac issues. He says when he eats he feels as if his food is not getting his to his stomach initially and when it does, he mmediately starts having bloating ,discomfort ,gas and belching etc. He describes this as being a different feeling than when he was having dysphagia symptoms a  year ago with choking. However he does say that when he has this sensation he'll drink some water and usually that helps the food go on down occasionally has some bubbling backup. His appetite has been okay ,weight has been stable. He denies any real abdominal pain. Says the bloating and gassiness will go on for hours however. No melena or hematochezia. He has been Famotidine 20  mg every morning at least over the past year. Labs were reviewed from recent hospitalization, as of 11/08/2015 LFTs were normal and CBC was unremarkable. Creatinine has been creeping up and labs done yesterday showed creatinine 1.6   Review of Systems Pertinent positive and negative review of systems were noted in the above HPI section.  All other review of systems was otherwise negative.  Outpatient Encounter Prescriptions as of 11/21/2015  Medication Sig  . allopurinol (ZYLOPRIM) 300 MG tablet Take 1 tablet (300 mg total) by mouth daily.  [name] aspirin 81 MG chewable tablet Chew 1 tablet (81 mg total) by mouth daily.  . clopidogrel (PLAVIX) 75 MG tablet Take 1 tablet (75 mg total) by mouth daily.  . colchicine (COLCRYS) 0.6 MG tablet Take 0.6 mg by mouth daily as needed (for gout).   [name] diltiazem (CARDIZEM CD) 180 MG 24 hr capsule Take 1 capsule (180 mg total) by mouth daily.  . famotidine (PEPCID) 20 MG tablet Take 20 mg by mouth daily.   . fluticasone (FLONASE) 50 MCG/ACT nasal spray Place 1-2 sprays into both nostrils daily as needed for allergies.   . furosemide (LASIX) 20 MG tablet Take 1 tablet (20 mg total) by mouth daily.  . isosorbide mononitrate (IMDUR) 30 MG 24 hr tablet Take 1 tablet (30 mg total) by mouth daily.  [name] lisinopril (PRINIVIL,ZESTRIL) 40 MG tablet Take 1 tablet (40 mg total) by mouth daily.  . metoprolol  tartrate (LOPRESSOR) 25 MG tablet TAKE 1/2 TABLET BY MOUTH TWICE A DAY (Patient taking differently: Take 12.5 mg by mouth twice daily)  . nitroGLYCERIN (NITROSTAT) 0.4 MG SL tablet Place 1 tablet (0.4 mg total) under the tongue every 5 (five) minutes x 3 doses as needed for chest pain.  . rosuvastatin (CRESTOR) 10 MG tablet Take 1 tablet (10 mg total) by mouth daily at 6 PM.  . warfarin (COUMADIN) 4 MG tablet Take 2-4 mg by mouth daily. Take 6 mg Sunday, Tuesday and Thursday. Take 4 mg by mouth on all other days.   No facility-administered encounter medications on file as of  11/21/2015.    Allergies  Allergen Reactions  . Penicillins Shortness Of Breath and Swelling    Has patient had a PCN reaction causing immediate rash, facial/tongue/throat swelling, SOB or lightheadedness with hypotensio: YES Has patient had a PCN reaction causing severe rash involving mucus membranes or skin necrosis: NO Has patient had a PCN reaction that required hospitalization NO Has patient had a PCN reaction occurring within the last 10 years: NO If all of the above answers are "NO", then may proceed with Cephalosporin use.  . Atorvastatin Other (See Comments)    Intolerant   . Biaxin [Clarithromycin] Diarrhea  . Doxycycline Other (See Comments)    GI upset   Patient Active Problem List   Diagnosis Date Noted  . Atypical angina ([place]) 11/08/2015  . Acute diastolic heart failure ([place])   . CAD S/P percutaneous coronary angioplasty   . Chronic atrial fibrillation ([place])   . Unstable angina ([place]) 10/25/2015  . CKD (chronic kidney disease), stage III   . Persistent atrial fibrillation ([place])   . Hypertension   . Hyperlipidemia   . Bradycardia   . Glucose intolerance (impaired glucose tolerance) 10/22/2015  . Back pain 05/24/2015  . Triceps tendonitis 05/24/2015  . ETD (eustachian tube dysfunction) 05/24/2015  . Hyperglycemia 12/18/2014  . Dysphagia, pharyngoesophageal phase 09/25/2014  . History of esophageal stricture 09/25/2014  . Chronic anticoagulation 09/25/2014  . Helicobacter pylori duodenitis 09/25/2014  . Benign paroxysmal positional vertigo 03/14/2014  . Encounter for therapeutic drug monitoring 04/12/2013  . Esophageal stricture from GERD 06/08/2011  . OSA on CPAP 09/22/2010  . HYPERCHOLESTEROLEMIA 08/15/2008  . Gout 08/15/2008  . OBESITY 08/15/2008  . Essential hypertension 08/15/2008  . CAD (coronary artery disease), native coronary artery 08/15/2008  . GASTROESOPHAGEAL REFLUX DISEASE 08/15/2008   Social History   Social History  . Marital status: Widowed     Spouse name: N/A  . Number of children: 3  . Years of education: N/A   Occupational History  . 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 Smoking status: Former Smoker    Packs/day: 0.12    Years: 4.00    Types: Cigarettes    Quit date: 03/16/1969  . Smokeless tobacco: Never Used     Comment: 1 pack/week for about 4 years, quit 1971  . Alcohol use No     Comment: 10/23/2015 "Quit all alcohol in 1970's"  . Drug use: No  . Sexual activity: Not on file   Other Topics Concern  . Not on file   Social History Narrative   Lives in [place], married 1953, widowed after 27 years   Retired Administrator, still does Biomedical scientist.   He is not routinely exercising.   Daily caffeine     Mr. Borgerding family history includes Dementia in his  father; Heart disease in his brother; Hypertension in his father and other; Pneumonia in his father; Stomach cancer in his mother.      Objective:    Vitals:   11/21/15 1358  BP: 110/66  Pulse: 88    Physical Exam  well-developed elderly white male in no acute distress, very pleasant blood pressure 110/66 pulse 88 height 5 foot 6 weight 212 BMI 34.2. HEENT; nontraumatic normocephalic EOMI PERRLA sclera anicteric, ruddy complexion Cardiovascular;and rhythm with S1-S2 no murmur or gallop, Pulmonary; clear bilaterally, Abdomen ;obese soft he is tender in the epigastrium and mildly in the right upper quadrant there is no palpable mass or hepatosplenomegaly no guarding or rebound bowel sounds are present abdomen is protuberant, rectal exam not done, Extremities; no clubbing cyanosis or edema skin warm and dry, Neuropsych; mood and affect appropriate       Assessment & Plan:   #63 82 year old white male with 2 month history of solid food dysphagia as well as abdominal discomfort bloating and gas immediately postprandially occurring with almost every meal. I suspect he does have a recurrent esophageal stricture.  Etiology of bloating and gas and upper abdominal discomfort is not clear. Will rule out gallbladder disease, gastropathy, peptic ulcer disease and underlying malignancy.  #2 coronary artery disease-patient status post very recent angioplasty 10/24/2015 and currently is on aspirin and Plavix and Coumadin-per cardiology notes plan was to continue Plavix 1 month- #3 atrial fibrillation #4 obstructive sleep apnea #5  hypertension #6 history of esophageal stricture status post dilation August 2016 #7 congestive heart failure EF 55-60%  Plan; Increase Pepcid to 20 mg by mouth twice a day Add Gas-X one with each meal Schedule for upper abdominal ultrasound Schedule for barium swallow with tablet We will need to avoid endoscopic evaluation for another month of possible until he can, off of Plavix. If we do need to do EGD will also need to hold Coumadin If ultrasound is unrevealing, he may need further imaging with CT of the abdomen and pelvis-IV contrast limited currently by elevated creatinine   Amy Genia Harold PA-C 11/21/2015   Cc: Tonia Ghent, MD

## 2015-11-21 NOTE — Telephone Encounter (Signed)
Left message for patient to call back  

## 2015-11-22 ENCOUNTER — Other Ambulatory Visit: Payer: Self-pay

## 2015-11-22 MED ORDER — FAMOTIDINE 20 MG PO TABS: 20.0000 mg | ORAL_TABLET | Freq: Two times a day (BID) | ORAL | 2 refills | Status: DC

## 2015-11-27 ENCOUNTER — Ambulatory Visit (HOSPITAL_COMMUNITY)
Admission: RE | Admit: 2015-11-27 | Discharge: 2015-11-27 | Disposition: A | Discharge status: Home / Self Care | Payer: Medicare Other | Source: Ambulatory Visit | Attending: Physician Assistant | Admitting: Physician Assistant

## 2015-11-27 ENCOUNTER — Ambulatory Visit (INDEPENDENT_AMBULATORY_CARE_PROVIDER_SITE_OTHER): Payer: Medicare Other | Admitting: *Deleted

## 2015-11-27 DIAGNOSIS — R1013 Epigastric pain: Secondary | ICD-10-CM

## 2015-11-27 DIAGNOSIS — K449 Diaphragmatic hernia without obstruction or gangrene: Secondary | ICD-10-CM | POA: Diagnosis not present

## 2015-11-27 DIAGNOSIS — R1310 Dysphagia, unspecified: Secondary | ICD-10-CM

## 2015-11-27 DIAGNOSIS — I4891 Unspecified atrial fibrillation: Secondary | ICD-10-CM | POA: Diagnosis not present

## 2015-11-27 DIAGNOSIS — K30 Functional dyspepsia: Secondary | ICD-10-CM

## 2015-11-27 DIAGNOSIS — Z5181 Encounter for therapeutic drug level monitoring: Secondary | ICD-10-CM | POA: Diagnosis not present

## 2015-11-27 DIAGNOSIS — R140 Abdominal distension (gaseous): Secondary | ICD-10-CM | POA: Insufficient documentation

## 2015-11-27 DIAGNOSIS — K760 Fatty (change of) liver, not elsewhere classified: Secondary | ICD-10-CM | POA: Diagnosis not present

## 2015-11-27 LAB — POCT INR: INR: 2.4

## 2015-11-28 ENCOUNTER — Other Ambulatory Visit: Payer: Self-pay

## 2015-11-28 DIAGNOSIS — G8929 Other chronic pain: Secondary | ICD-10-CM

## 2015-11-28 DIAGNOSIS — R1013 Epigastric pain: Principal | ICD-10-CM

## 2015-11-30 ENCOUNTER — Other Ambulatory Visit: Payer: Self-pay | Admitting: Cardiovascular Disease

## 2015-12-02 ENCOUNTER — Encounter: Payer: Self-pay | Admitting: Cardiovascular Disease

## 2015-12-02 ENCOUNTER — Ambulatory Visit (INDEPENDENT_AMBULATORY_CARE_PROVIDER_SITE_OTHER): Payer: Medicare Other | Admitting: Cardiovascular Disease

## 2015-12-02 VITALS — BP 102/60 | HR 86 | Ht 66.0 in | Wt 212.8 lb

## 2015-12-02 DIAGNOSIS — I4891 Unspecified atrial fibrillation: Secondary | ICD-10-CM

## 2015-12-02 DIAGNOSIS — Z9861 Coronary angioplasty status: Secondary | ICD-10-CM

## 2015-12-02 DIAGNOSIS — I2510 Atherosclerotic heart disease of native coronary artery without angina pectoris: Secondary | ICD-10-CM | POA: Diagnosis not present

## 2015-12-02 NOTE — Patient Instructions (Signed)
Medication Instructions:  Your physician has recommended you make the following change in your medication:  1. STOP PLAVIX (Clopidogrel)  Labwork: No new orders.   Testing/Procedures: No new orders.   Follow-Up: Your physician wants you to follow-up in: 6 MONTHS with Dr Burt Knack.  You will receive a reminder letter in the mail two months in advance. If you don't receive a letter, please call our office to schedule the follow-up appointment.   Any Other Special Instructions Will Be Listed Below (If Applicable).     If you need a refill on your cardiac medications before your next appointment, please call your pharmacy.

## 2015-12-02 NOTE — Progress Notes (Signed)
Cardiology Office Note Date:  12/02/2015   ID:  [name], DOB 1931-02-28, MRN [id number]  PCP:  Elsie Stain, MD  Cardiologist:  Sherren Mocha, MD    Chief Complaint  Patient presents with  . Coronary Artery Disease     History of Present Illness: [name] is a 82 y.o. male who presents for Hospital follow-up evaluation. He was last seen 11/01/2015 after presenting with chest pain and undergoing cardiac catheterization. This demonstrated total occlusion of the small obtuse marginal branch. The patient was treated with balloon angioplasty. He was noted to have minor nonobstructive disease involving the LAD and right coronary arteries. LV function was normal with an LVEF of 55-60%. He was treated with aspirin, Plavix, and has continued on warfarin for chronic oral anticoagulation indicated for paroxysmal atrial fibrillation.  He returns today for follow-up. He feels like he is doing fine. He's had no recurrent chest pain. No shortness of breath, edema, or heart palpitations. He complains of abdominal bloating and is undergoing GI evaluation.  Past Medical History:  Diagnosis Date  . Bradycardia    a. nocturnal with pauses overnight on tele likely due to OSA 10/2015.  [name] CKD (chronic kidney disease), stage III   . Coronary artery disease    a. prev nonobst. b. LHC 10/24/15: occlusion of small diffusely diseased OM2 s/p balloon angioplasty, mild nonobstructive disease of mLAD and mRCA, normal LVEDP  . Diverticulosis   . Esophageal reflux   . Esophageal stricture from GERD 06/08/2011   With stricture at GE junction on EGD, dilated 06/2011   . Gastric ulcer    on EGD 2013  . Gout, unspecified   . Hyperlipidemia   . Hypertension   . Obesity, unspecified   . Orthopnea   . OSA on CPAP   . Paroxysmal nocturnal dyspnea   . Persistent atrial fibrillation ([place])   . Vertigo     Past Surgical History:  Procedure Laterality Date  . BALLOON DILATION N/A 10/22/2014   Procedure:  BALLOON DILATION;  Surgeon: [name], MD;  Location: [facility];  Service: Endoscopy;  Laterality: N/A;  . CARDIAC CATHETERIZATION  07/2008  . CARDIAC CATHETERIZATION N/A 10/24/2015   Procedure: Left Heart Cath and Coronary Angiography;  Surgeon: [name], MD;  Location: [facility];  Service: Cardiovascular;  Laterality: N/A;  . CARDIAC CATHETERIZATION N/A 10/24/2015   Procedure: Coronary Balloon Angioplasty;  Surgeon: [name], MD;  Location: [facility];  Service: Cardiovascular;  Laterality: N/A;  . Carotid Dopplers  09/2009   no sig extracranial stenosis and vertebral arteries had antegrade flow  . CATARACT EXTRACTION W/ INTRAOCULAR LENS  IMPLANT, BILATERAL Bilateral   . ESOPHAGOGASTRODUODENOSCOPY (EGD) WITH ESOPHAGEAL DILATION     "I've had it stretched 3 times" (10/23/2015)  . ESOPHAGOGASTRODUODENOSCOPY (EGD) WITH PROPOFOL N/A 10/22/2014   Procedure: ESOPHAGOGASTRODUODENOSCOPY (EGD) WITH PROPOFOL;  Surgeon: [name], MD;  [facility]: [facility];  Service: Endoscopy;  Laterality: N/A;  . MRI of brain  09/2009   chronic microvascular ischemia  . OSA Sleep Study     per Dr. Brett Fairy  . SHOULDER ARTHROSCOPY W/ ROTATOR CUFF REPAIR Right 2008    Current Outpatient Prescriptions  Medication Sig Dispense Refill  . allopurinol (ZYLOPRIM) 300 MG tablet Take 300 mg by mouth daily as needed (gout flare if Colchicine doesn't work).    [name] aspirin 81 MG chewable tablet Chew 1 tablet (81 mg total) by mouth daily. 30 tablet 0  .  colchicine (COLCRYS) 0.6 MG tablet Take 0.6 mg by mouth daily as needed (for gout).     [name] diltiazem (CARDIZEM CD) 180 MG 24 hr capsule Take 1 capsule (180 mg total) by mouth daily. 30 capsule 5  . famotidine (PEPCID) 20 MG tablet Take 1 tablet (20 mg total) by mouth 2 (two) times daily. 60 tablet 2  . fluticasone (FLONASE) 50 MCG/ACT nasal spray Place 1-2 sprays into both nostrils daily as needed for allergies.     . furosemide (LASIX) 20 MG  tablet Take 1 tablet (20 mg total) by mouth daily. 30 tablet 5  . isosorbide mononitrate (IMDUR) 30 MG 24 hr tablet Take 1 tablet (30 mg total) by mouth daily. 30 tablet 5  . lisinopril (PRINIVIL,ZESTRIL) 40 MG tablet Take 1 tablet (40 mg total) by mouth daily. 90 tablet 2  . metoprolol tartrate (LOPRESSOR) 25 MG tablet TAKE 1/2 TABLET BY MOUTH TWICE A DAY (Patient taking differently: Take 12.5 mg by mouth twice daily) 30 tablet 9  . nitroGLYCERIN (NITROSTAT) 0.4 MG SL tablet Place 1 tablet (0.4 mg total) under the tongue every 5 (five) minutes x 3 doses as needed for chest pain. 25 tablet 2  . warfarin (COUMADIN) 4 MG tablet Take 2-4 mg by mouth daily. Take 6 mg Sunday, Tuesday and Thursday. Take 4 mg by mouth on all other days.     No current facility-administered medications for this visit.     Allergies:   Penicillins; Atorvastatin; Biaxin [clarithromycin]; and Doxycycline   Social History:  The patient  reports that he quit smoking about 46 years ago. His smoking use included Cigarettes. He has a 0.48 pack-year smoking history. He has never used smokeless tobacco. He reports that he does not drink alcohol or use drugs.   Family History:  The patient's  family history includes Dementia in his father; Heart disease in his brother; Hypertension in his father and other; Pneumonia in his father; Stomach cancer in his mother.    ROS:  Please see the history of present illness.  All other systems are reviewed and negative.    PHYSICAL EXAM: VS:  BP 102/60   Pulse 86   Ht 5\' 6"  (1.676 m)   Wt 212 lb 12.8 oz (96.5 kg)   BMI 34.35 kg/m  , BMI Body mass index is 34.35 kg/m. GEN: Well nourished, well developed, in no acute distress  HEENT: normal  Neck: no JVD, no masses. No carotid bruits Cardiac: irregularly irregular without murmur or gallop                Respiratory:  clear to auscultation bilaterally, normal work of breathing GI: soft, nontender, obese MS: no deformity or atrophy    Ext: no pretibial edema, pedal pulses 2+= bilaterally Skin: warm and dry, no rash Neuro:  Strength and sensation are intact Psych: euthymic mood, full affect  EKG:  EKG is not ordered today.  Recent Labs: 12/06/2014: TSH 1.07 11/08/2015: ALT 29; B Natriuretic Peptide 121.1 11/09/2015: Hemoglobin 14.0; Platelets 168 11/20/2015: BUN 33; Creatinine, Ser 1.52; Potassium 4.8; Sodium 136   Lipid Panel     Component Value Date/Time   CHOL 176 10/24/2015 0430   TRIG 357 (H) 10/24/2015 0430   HDL 23 (L) 10/24/2015 0430   CHOLHDL 7.7 10/24/2015 0430   VLDL 71 (H) 10/24/2015 0430   LDLCALC 82 10/24/2015 0430   LDLDIRECT 92.0 12/06/2014 0832      Wt Readings from Last 3 Encounters:  12/02/15 212  lb 12.8 oz (96.5 kg)  11/21/15 212 lb (96.2 kg)  11/09/15 214 lb 11.2 oz (97.4 kg)     Cardiac Studies Reviewed: Cardiac Catheterization 10-24-2015: Conclusion     Mid RCA lesion, 15 %stenosed.  Ost 2nd Mrg to 2nd Mrg lesion, 50 %stenosed.  Mid LAD lesion, 30 %stenosed.  2nd Mrg lesion, 100 %stenosed.  Post intervention, there is a 30% residual stenosis.  LV end diastolic pressure is normal.   1.  Occlusion of small, diffusely diseased OM2 branch. 2.  Mild, non-obstructive coronary artery disease involving the mid LAD and mid RCA. 3.  Normal left ventricular filling pressure (LVEDP 13 mmHg). 4.  Successful balloon angioplasty of OM2 using 2.0 x 12 mm balloon with improvement in stenosis from 100% to 30% with restoration of TIMI-3 flow.  Stent was not placed due to the small vessel size with diffuse disease extending to the ostium.  Plan: 1.  Dual antiplatelet therapy for at least 1 month.  Given that patient is on warfarin as well, would recommend switching from ticagrelor (loaded in cath lab) to clopidogrel to reduce risk for bleeding complications. 2.  Restart warfarin tomorrow. 3.  Aggressive secondary prevention. 4.  Continue IV hydration x 6 hours, with close follow-up of  renal status.     Echo 10-28-2015: Study Conclusions  - Left ventricle: The cavity size was normal. Wall thickness was   normal. Systolic function was normal. The estimated ejection   fraction was in the range of 55% to 60%. Wall motion was normal;   there were no regional wall motion abnormalities. - Mitral valve: Moderately calcified annulus. Mildly thickened,   mildly calcified leaflets . Valve area by continuity equation   (using LVOT flow): 1.28 cm^2. - Left atrium: The atrium was moderately dilated. - Right ventricle: The cavity size was mildly dilated. Systolic   function was mildly reduced.  ASSESSMENT AND PLAN: 1.  Non-STEMI: Recent cardiac catheterization films reviewed and PCI of a small OM branch noted. The patient is now greater than 30 days out from his procedure. I recommended that he stop Plavix. He will continue on low-dose aspirin and chronic oral anticoagulation with warfarin. He is having no recurrent ischemic symptoms.  2. Chronic atrial fibrillation: Continue rate control and anticoagulation. Home blood pressure and pulse readings reviewed. Rate is controlled on Cardizem and metoprolol. Bleeding problems reported on chronic warfarin.  3. Hyperlipidemia: Treated with Crestor 10 mg daily. Lifestyle modification reviewed.  Current medicines are reviewed with the patient today.  The patient does not have concerns regarding medicines.  Labs/ tests ordered today include:  No orders of the defined types were placed in this encounter.   Disposition:   FU 6 months  Signed, Sherren Mocha, MD  12/02/2015 10:16 PM    [facility] [place], [place], [place]  [address] Phone: [phone]; Fax: [phone]

## 2015-12-02 NOTE — Telephone Encounter (Signed)
I spoke with the pt's daughter Lawerance Bach and she was not able to attend the pt's office visit today due to a stomach virus.  I made her aware that we advised to stop plavix.  The pt has had stomach issues recently and the pt previously took prilosec but this was changed to pepcid due to drug interaction.  Tabitha asked if the pt could go back to taking prilosec since plavix was being stopped. I advised that the pt can switch his medication back to previous dosage of prilosec 40mg  daily.

## 2015-12-02 NOTE — Telephone Encounter (Signed)
New Message  Pt c/o medication issue:  1. Name of Medication: Pts daughter voiced she did not know  2. How are you currently taking this medication (dosage and times per day)? N/A  3. Are you having a reaction (difficulty breathing--STAT)? N/A  4. What is your medication issue? Pts daughter voiced she'd like to discuss medications with Lauren. Per patient she did not know medications right off hand but would like for Sherron Monday to contact her.  Please f/u

## 2015-12-03 ENCOUNTER — Other Ambulatory Visit: Payer: Self-pay | Admitting: *Deleted

## 2015-12-03 ENCOUNTER — Other Ambulatory Visit: Payer: Self-pay

## 2015-12-05 ENCOUNTER — Ambulatory Visit (INDEPENDENT_AMBULATORY_CARE_PROVIDER_SITE_OTHER)
Admission: RE | Admit: 2015-12-05 | Discharge: 2015-12-05 | Disposition: A | Discharge status: Home / Self Care | Payer: Medicare Other | Source: Ambulatory Visit | Attending: Physician Assistant | Admitting: Physician Assistant

## 2015-12-05 ENCOUNTER — Other Ambulatory Visit: Payer: Self-pay

## 2015-12-05 DIAGNOSIS — R1013 Epigastric pain: Secondary | ICD-10-CM

## 2015-12-05 DIAGNOSIS — Z23 Encounter for immunization: Secondary | ICD-10-CM | POA: Diagnosis not present

## 2015-12-05 DIAGNOSIS — G8929 Other chronic pain: Secondary | ICD-10-CM

## 2015-12-05 MED ORDER — OMEPRAZOLE 40 MG PO CPDR: 40.0000 mg | DELAYED_RELEASE_CAPSULE | Freq: Every day | ORAL | Status: DC

## 2015-12-05 MED ORDER — IOPAMIDOL (ISOVUE-300) INJECTION 61%
100.0000 mL | Freq: Once | INTRAVENOUS | Status: AC | PRN
  Administered 2015-12-05: 80 mL via INTRAVENOUS

## 2015-12-10 ENCOUNTER — Other Ambulatory Visit: Payer: Medicare Other

## 2015-12-25 ENCOUNTER — Ambulatory Visit (INDEPENDENT_AMBULATORY_CARE_PROVIDER_SITE_OTHER): Payer: Medicare Other | Admitting: *Deleted

## 2015-12-25 DIAGNOSIS — I4891 Unspecified atrial fibrillation: Secondary | ICD-10-CM | POA: Diagnosis not present

## 2015-12-25 DIAGNOSIS — Z5181 Encounter for therapeutic drug level monitoring: Secondary | ICD-10-CM | POA: Diagnosis not present

## 2015-12-25 LAB — POCT INR: INR: 2.7

## 2015-12-28 ENCOUNTER — Other Ambulatory Visit: Payer: Self-pay | Admitting: Family Medicine

## 2015-12-30 NOTE — Telephone Encounter (Signed)
Prescribed by Dr. Burt Knack historically.  Please advise.

## 2015-12-30 NOTE — Telephone Encounter (Signed)
Sent. Thanks.   

## 2016-01-29 ENCOUNTER — Ambulatory Visit (INDEPENDENT_AMBULATORY_CARE_PROVIDER_SITE_OTHER): Payer: Medicare Other | Admitting: *Deleted

## 2016-01-29 DIAGNOSIS — I4891 Unspecified atrial fibrillation: Secondary | ICD-10-CM

## 2016-01-29 DIAGNOSIS — I2510 Atherosclerotic heart disease of native coronary artery without angina pectoris: Secondary | ICD-10-CM

## 2016-01-29 DIAGNOSIS — Z5181 Encounter for therapeutic drug level monitoring: Secondary | ICD-10-CM | POA: Diagnosis not present

## 2016-01-29 DIAGNOSIS — Z9861 Coronary angioplasty status: Secondary | ICD-10-CM

## 2016-01-29 LAB — POCT INR: INR: 2.1

## 2016-02-10 ENCOUNTER — Ambulatory Visit: Payer: Medicare Other

## 2016-02-13 ENCOUNTER — Ambulatory Visit (INDEPENDENT_AMBULATORY_CARE_PROVIDER_SITE_OTHER): Payer: Medicare Other

## 2016-02-13 ENCOUNTER — Other Ambulatory Visit: Payer: Self-pay | Admitting: Family Medicine

## 2016-02-13 ENCOUNTER — Ambulatory Visit: Payer: Medicare Other | Admitting: Cardiovascular Disease

## 2016-02-13 VITALS — BP 124/80 | HR 95 | Temp 98.6°F | Ht 63.0 in | Wt 211.0 lb

## 2016-02-13 DIAGNOSIS — I10 Essential (primary) hypertension: Secondary | ICD-10-CM

## 2016-02-13 DIAGNOSIS — E784 Other hyperlipidemia: Secondary | ICD-10-CM

## 2016-02-13 DIAGNOSIS — Z0000 Encounter for general adult medical examination without abnormal findings: Secondary | ICD-10-CM | POA: Diagnosis not present

## 2016-02-13 DIAGNOSIS — E785 Hyperlipidemia, unspecified: Secondary | ICD-10-CM | POA: Diagnosis not present

## 2016-02-13 DIAGNOSIS — E7849 Other hyperlipidemia: Secondary | ICD-10-CM

## 2016-02-13 DIAGNOSIS — M109 Gout, unspecified: Secondary | ICD-10-CM

## 2016-02-13 LAB — BASIC METABOLIC PANEL
BUN: 23 mg/dL (ref 6–23)
CALCIUM: 9.5 mg/dL (ref 8.4–10.5)
CO2: 30 mEq/L (ref 19–32)
Chloride: 101 mEq/L (ref 96–112)
Creatinine, Ser: 1.41 mg/dL (ref 0.40–1.50)
GFR: 51 mL/min — AB (ref >60.00)
GLUCOSE: 93 mg/dL (ref 70–99)
POTASSIUM: 4.2 meq/L (ref 3.5–5.1)
SODIUM: 138 meq/L (ref 135–145)

## 2016-02-13 LAB — HEPATIC FUNCTION PANEL
ALT: 22 U/L (ref 0–53)
AST: 24 U/L (ref 0–37)
Albumin: 4.4 g/dL (ref 3.5–5.2)
Alkaline Phosphatase: 75 U/L (ref 39–117)
BILIRUBIN DIRECT: 0.1 mg/dL (ref 0.0–0.3)
BILIRUBIN TOTAL: 0.4 mg/dL (ref 0.2–1.2)
TOTAL PROTEIN: 7.5 g/dL (ref 6.0–8.3)

## 2016-02-13 LAB — LIPID PANEL
CHOLESTEROL: 201 mg/dL — AB (ref 0–200)
HDL: 27.1 mg/dL — ABNORMAL LOW (ref >39.00)
Total CHOL/HDL Ratio: 7

## 2016-02-13 LAB — URIC ACID: URIC ACID, SERUM: 10.2 mg/dL — AB (ref 4.0–7.8)

## 2016-02-13 LAB — TSH: TSH: 1.56 u[IU]/mL (ref 0.35–4.50)

## 2016-02-13 LAB — LDL CHOLESTEROL, DIRECT: Direct LDL: 105 mg/dL

## 2016-02-13 NOTE — Progress Notes (Signed)
Pre visit review using our clinic review tool, if applicable. No additional management support is needed unless otherwise documented below in the visit note. 

## 2016-02-13 NOTE — Progress Notes (Signed)
PCP notes:  Health maintenance:  Flu vaccine - per pt, vaccine administered in Sept 2017  Abnormal screenings:   Hearing - failed  Patient concerns:   None  Nurse concerns:  None  Next PCP appt:   02/20/16 @ 1215  I reviewed health advisor's note, was available for consultation on the day of service listed in this note, and agree with documentation and plan. Elsie Stain, MD.

## 2016-02-13 NOTE — Progress Notes (Signed)
Subjective:   [name] is a 82 y.o. male who presents for Medicare Annual/Subsequent preventive examination.  Review of Systems:  N/A Cardiac Risk Factors include: advanced age (>18men, >66 women);male gender;obesity (BMI >30kg/m2);dyslipidemia;hypertension     Objective:    Vitals: BP 124/80 (BP Location: Left Arm, Patient Position: Sitting, Cuff Size: Normal)   Pulse 95   Temp 98.6 F (37 C) (Oral)   Ht 5\' 3"  (1.6 m) Comment: NO SHOES  Wt 211 lb (95.7 kg)   SpO2 95%   BMI 37.38 kg/m   Body mass index is 37.38 kg/m.  Tobacco History  Smoking Status  . Former Smoker  . Packs/day: 0.12  . Years: 4.00  . Types: Cigarettes  . Quit date: 03/16/1969  Smokeless Tobacco  . Never Used    Comment: 1 pack/week for about 4 years, quit 1971     Counseling given: No   Past Medical History:  Diagnosis Date  . Bradycardia    a. nocturnal with pauses overnight on tele likely due to OSA 10/2015.  [name] CKD (chronic kidney disease), stage III   . Coronary artery disease    a. prev nonobst. b. LHC 10/24/15: occlusion of small diffusely diseased OM2 s/p balloon angioplasty, mild nonobstructive disease of mLAD and mRCA, normal LVEDP  . Diverticulosis   . Esophageal reflux   . Esophageal stricture from GERD 06/08/2011   With stricture at GE junction on EGD, dilated 06/2011   . Gastric ulcer    on EGD 2013  . Gout, unspecified   . Hyperlipidemia   . Hypertension   . Obesity, unspecified   . Orthopnea   . OSA on CPAP   . Paroxysmal nocturnal dyspnea   . Persistent atrial fibrillation ([place])   . Vertigo    Past Surgical History:  Procedure Laterality Date  . BALLOON DILATION N/A 10/22/2014   Procedure: BALLOON DILATION;  Surgeon: [name], MD;  Location: [facility];  Service: Endoscopy;  Laterality: N/A;  . CARDIAC CATHETERIZATION  07/2008  . CARDIAC CATHETERIZATION N/A 10/24/2015   Procedure: Left Heart Cath and Coronary Angiography;  Surgeon: [name], MD;   Location: [place];  Service: Cardiovascular;  Laterality: N/A;  . CARDIAC CATHETERIZATION N/A 10/24/2015   Procedure: Coronary Balloon Angioplasty;  Surgeon: [name], MD;  Location: [facility];  Service: Cardiovascular;  Laterality: N/A;  . Carotid Dopplers  09/2009   no sig extracranial stenosis and vertebral arteries had antegrade flow  . CATARACT EXTRACTION W/ INTRAOCULAR LENS  IMPLANT, BILATERAL Bilateral   . ESOPHAGOGASTRODUODENOSCOPY (EGD) WITH ESOPHAGEAL DILATION     "I've had it stretched 3 times" (10/23/2015)  . ESOPHAGOGASTRODUODENOSCOPY (EGD) WITH PROPOFOL N/A 10/22/2014   Procedure: ESOPHAGOGASTRODUODENOSCOPY (EGD) WITH PROPOFOL;  Surgeon: [name], MD;  [facility];  Service: Endoscopy;  Laterality: N/A;  . MRI of brain  09/2009   chronic microvascular ischemia  . OSA Sleep Study     per Dr. Brett Fairy  . SHOULDER ARTHROSCOPY W/ ROTATOR CUFF REPAIR Right 2008   Family History  Problem Relation Age of Onset  . Stomach cancer Mother     died in 25's   . Hypertension Father     died in his 66's pna likely dementia  . Dementia Father   . Pneumonia Father   . Heart disease Brother     S/P CABG  . Hypertension Other   . Prostate cancer Neg Hx   . Colon cancer Neg Hx  History  Sexual Activity  . Sexual activity: No    Outpatient Encounter Prescriptions as of 02/13/2016  Medication Sig  . allopurinol (ZYLOPRIM) 300 MG tablet TAKE 1&1/2 TABLET BY MOUTH DAILY  . aspirin 81 MG chewable tablet Chew 1 tablet (81 mg total) by mouth daily.  . colchicine (COLCRYS) 0.6 MG tablet Take 0.6 mg by mouth daily as needed (for gout).   [name] diltiazem (CARDIZEM CD) 180 MG 24 hr capsule Take 1 capsule (180 mg total) by mouth daily.  . fluticasone (FLONASE) 50 MCG/ACT nasal spray Place 1-2 sprays into both nostrils daily as needed for allergies.   . furosemide (LASIX) 20 MG tablet Take 1 tablet (20 mg total) by mouth daily.  . isosorbide mononitrate  (IMDUR) 30 MG 24 hr tablet Take 1 tablet (30 mg total) by mouth daily.  [name] lisinopril (PRINIVIL,ZESTRIL) 40 MG tablet Take 1 tablet (40 mg total) by mouth daily.  . metoprolol tartrate (LOPRESSOR) 25 MG tablet TAKE 1/2 TABLET BY MOUTH TWICE A DAY  . nitroGLYCERIN (NITROSTAT) 0.4 MG SL tablet Place 1 tablet (0.4 mg total) under the tongue every 5 (five) minutes x 3 doses as needed for chest pain.  [name] omeprazole (PRILOSEC) 40 MG capsule Take 1 capsule (40 mg total) by mouth daily.  [name] warfarin (COUMADIN) 4 MG tablet TAKE AS DIRECTED BY COUMADIN CLINIC  . [DISCONTINUED] allopurinol (ZYLOPRIM) 300 MG tablet Take 300 mg by mouth daily as needed (gout flare if Colchicine doesn't work).   No facility-administered encounter medications on file as of 02/13/2016.     Activities of Daily Living In your present state of health, do you have any difficulty performing the following activities: 02/13/2016 11/08/2015  Hearing? N N  Vision? N N  Difficulty concentrating or making decisions? N N  Walking or climbing stairs? N N  Dressing or bathing? N N  Doing errands, shopping? N N  Preparing Food and eating ? N -  Using the Toilet? N -  In the past six months, have you accidently leaked urine? N -  Do you have problems with loss of bowel control? N -  Managing your Medications? N -  Managing your Finances? N -  Housekeeping or managing your Housekeeping? N -  Some recent data might be hidden    Patient Care Team: Tonia Ghent, MD as PCP - General Sherren Mocha, MD (Cardiology)   Assessment:     Hearing Screening   125Hz  250Hz  500Hz  1000Hz  2000Hz  3000Hz  4000Hz  6000Hz  8000Hz   Right ear:   0 0 0  0    Left ear:   40 0 0  0    Vision Screening Comments: Last vision exam with Dr. Sabra Heck approx. 1 yr ago   Exercise Activities and Dietary recommendations Current Exercise Habits: Home exercise routine, Type of exercise: stretching;walking, Time (Minutes): 30, Frequency (Times/Week): 7, Weekly  Exercise (Minutes/Week): 210, Intensity: Mild, Exercise limited by: None identified  Goals    . Increase physical activity          Starting 02/13/2016, I will continue to exercise at least 30 min daily.       Fall Risk Fall Risk  02/13/2016 12/17/2014 08/22/2013 06/23/2012  Falls in the past year? No No No No   Depression Screen PHQ 2/9 Scores 02/13/2016 12/17/2014 08/22/2013 06/23/2012  PHQ - 2 Score 0 0 0 0    Cognitive Function MMSE - Mini Mental State Exam 02/13/2016  Orientation to time 5  Orientation to Place 5  Registration 3  Attention/ Calculation 0  Recall 3  Language- name 2 objects 0  Language- repeat 1  Language- follow 3 step command 3  Language- read & follow direction 0  Write a sentence 0  Copy design 0  Total score 20       PLEASE NOTE: A Mini-Cog screen was completed. Maximum score is 20. A value of 0 denotes this part of [place] MMSE was not completed or the patient failed this part of the Mini-Cog screening.   Mini-Cog Screening Orientation to Time - Max 5 pts Orientation to Place - Max 5 pts Registration - Max 3 pts Recall - Max 3 pts Language Repeat - Max 1 pts Language Follow 3 Step Command - Max 3 pts   Immunization History  Administered Date(s) Administered  . Influenza, Seasonal, Injecte, Preservative Fre 12/14/2013  . Influenza,inj,Quad PF,36+ Mos 12/17/2014  . Pneumococcal Conjugate-13 12/17/2014  . Pneumococcal Polysaccharide-23 09/19/2009  . Td 09/19/2009  . Zoster 09/19/2009   Screening Tests Health Maintenance  Topic Date Due  . TETANUS/TDAP  09/20/2019  . INFLUENZA VACCINE  Addressed  . ZOSTAVAX  Completed  . PNA vac Low Risk Adult  Completed      Plan:     I have personally reviewed and addressed the Medicare Annual Wellness questionnaire and have noted the following in the patient's chart:  A. Medical and social history B. Use of alcohol, tobacco or illicit drugs  C. Current medications and supplements D. Functional  ability and status E.  Nutritional status F.  Physical activity G. Advance directives H. List of other physicians I.  Hospitalizations, surgeries, and ER visits in previous 12 months J.  [place] to include hearing, vision, cognitive, depression L. Referrals and appointments - none  In addition, I have reviewed and discussed with patient certain preventive protocols, quality metrics, and best practice recommendations. A written personalized care plan for preventive services as well as general preventive health recommendations were provided to patient.  See attached scanned questionnaire for additional information.   Signed,   Lindell Noe, MHA, BS, LPN Health Coach

## 2016-02-13 NOTE — Patient Instructions (Signed)
[name] , Thank you for taking time to come for your Medicare Wellness Visit. I appreciate your ongoing commitment to your health goals. Please review the following plan we discussed and let me know if I can assist you in the future.   These are the goals we discussed: Goals    . Increase physical activity          Starting 02/13/2016, I will continue to exercise at least 30 min daily.        This is a list of the screening recommended for you and due dates:  Health Maintenance  Topic Date Due  . Tetanus Vaccine  09/20/2019  . Flu Shot  Addressed  . Shingles Vaccine  Completed  . Pneumonia vaccines  Completed   Preventive Care for Adults  A healthy lifestyle and preventive care can promote health and wellness. Preventive health guidelines for adults include the following key practices.  . A routine yearly physical is a good way to check with your health care provider about your health and preventive screening. It is a chance to share any concerns and updates on your health and to receive a thorough exam.  . Visit your dentist for a routine exam and preventive care every 6 months. Brush your teeth twice a day and floss once a day. Good oral hygiene prevents tooth decay and gum disease.  . The frequency of eye exams is based on your age, health, family medical history, use  of contact lenses, and other factors. Follow your health care provider's ecommendations for frequency of eye exams.  . Eat a healthy diet. Foods like vegetables, fruits, whole grains, low-fat dairy products, and lean protein foods contain the nutrients you need without too many calories. Decrease your intake of foods high in solid fats, added sugars, and salt. Eat the right amount of calories for you. Get information about a proper diet from your health care provider, if necessary.  . Regular physical exercise is one of the most important things you can do for your health. Most adults should get at least 150  minutes of moderate-intensity exercise (any activity that increases your heart rate and causes you to sweat) each week. In addition, most adults need muscle-strengthening exercises on 2 or more days a week.  Silver Sneakers may be a benefit available to you. To determine eligibility, you may visit the website: www.silversneakers.com or contact program at 8705500685 Mon-Fri between 8AM-8PM.   . Maintain a healthy weight. The body mass index (BMI) is a screening tool to identify possible weight problems. It provides an estimate of body fat based on height and weight. Your health care provider can find your BMI and can help you achieve or maintain a healthy weight.   For adults 20 years and older: ? A BMI below 18.5 is considered underweight. ? A BMI of 18.5 to 24.9 is normal. ? A BMI of 25 to 29.9 is considered overweight. ? A BMI of 30 and above is considered obese.   . Maintain normal blood lipids and cholesterol levels by exercising and minimizing your intake of saturated fat. Eat a balanced diet with plenty of fruit and vegetables. Blood tests for lipids and cholesterol should begin at age 9 and be repeated every 5 years. If your lipid or cholesterol levels are high, you are over 50, or you are at high risk for heart disease, you may need your cholesterol levels checked more frequently. Ongoing high lipid and cholesterol levels should be treated with  medicines if diet and exercise are not working.  . If you smoke, find out from your health care provider how to quit. If you do not use tobacco, please do not start.  . If you choose to drink alcohol, please do not consume more than 2 drinks per day. One drink is considered to be 12 ounces (355 mL) of beer, 5 ounces (148 mL) of wine, or 1.5 ounces (44 mL) of liquor.  . If you are 73-34 years old, ask your health care provider if you should take aspirin to prevent strokes.  . Use sunscreen. Apply sunscreen liberally and repeatedly throughout  the day. You should seek shade when your shadow is shorter than you. Protect yourself by wearing long sleeves, pants, a wide-brimmed hat, and sunglasses year round, whenever you are outdoors.  . Once a month, do a whole body skin exam, using a mirror to look at the skin on your back. Tell your health care provider of new moles, moles that have irregular borders, moles that are larger than a pencil eraser, or moles that have changed in shape or color.

## 2016-02-14 ENCOUNTER — Ambulatory Visit: Payer: Medicare Other | Admitting: Internal Medicine

## 2016-02-20 ENCOUNTER — Other Ambulatory Visit: Payer: Self-pay

## 2016-02-20 ENCOUNTER — Ambulatory Visit (INDEPENDENT_AMBULATORY_CARE_PROVIDER_SITE_OTHER): Payer: Medicare Other | Admitting: Family Medicine

## 2016-02-20 ENCOUNTER — Encounter: Payer: Self-pay | Admitting: Family Medicine

## 2016-02-20 VITALS — BP 128/68 | HR 77 | Temp 97.9°F | Ht 63.0 in | Wt 216.2 lb

## 2016-02-20 DIAGNOSIS — M109 Gout, unspecified: Secondary | ICD-10-CM

## 2016-02-20 DIAGNOSIS — Z8719 Personal history of other diseases of the digestive system: Secondary | ICD-10-CM

## 2016-02-20 DIAGNOSIS — Z9989 Dependence on other enabling machines and devices: Secondary | ICD-10-CM

## 2016-02-20 DIAGNOSIS — I4819 Other persistent atrial fibrillation: Secondary | ICD-10-CM

## 2016-02-20 DIAGNOSIS — I2510 Atherosclerotic heart disease of native coronary artery without angina pectoris: Secondary | ICD-10-CM

## 2016-02-20 DIAGNOSIS — Z9861 Coronary angioplasty status: Secondary | ICD-10-CM | POA: Diagnosis not present

## 2016-02-20 DIAGNOSIS — Z7189 Other specified counseling: Secondary | ICD-10-CM

## 2016-02-20 DIAGNOSIS — E7800 Pure hypercholesterolemia, unspecified: Secondary | ICD-10-CM

## 2016-02-20 DIAGNOSIS — G4733 Obstructive sleep apnea (adult) (pediatric): Secondary | ICD-10-CM

## 2016-02-20 DIAGNOSIS — E785 Hyperlipidemia, unspecified: Secondary | ICD-10-CM

## 2016-02-20 DIAGNOSIS — I481 Persistent atrial fibrillation: Secondary | ICD-10-CM

## 2016-02-20 NOTE — Assessment & Plan Note (Signed)
Continue CPAP, d/w pt about weight loss.

## 2016-02-20 NOTE — Assessment & Plan Note (Addendum)
Recheck lipids when fasting, consider pravastatin since didn't tolerate atorvastatin.  D/w pt about diet and weight loss.  Labs d/w pt, esp high TG. >25 minutes spent in face to face time with patient, >50% spent in counselling or coordination of care.

## 2016-02-20 NOTE — Assessment & Plan Note (Signed)
Continue PPI.  D/w pt.

## 2016-02-20 NOTE — Assessment & Plan Note (Signed)
Stop allopurinol, since not having frequent flares.  Use colchicine prn.  D/w pt about weight loss and diet.

## 2016-02-20 NOTE — Assessment & Plan Note (Deleted)
>  25 minutes spent in face to face time with patient, >50% spent in counselling or coordination of care.   

## 2016-02-20 NOTE — Assessment & Plan Note (Signed)
Recheck lipids when fasting, consider pravastatin since didn't tolerate atorvastatin.  D/w pt about diet and weight loss.  Labs d/w pt, esp high TG.

## 2016-02-20 NOTE — Assessment & Plan Note (Signed)
Advance directive d/w pt.  Daughters Nancy and Tabitha equally designated if patient were incapacitated.   

## 2016-02-20 NOTE — Patient Instructions (Addendum)
Stop the allopurinol for now.  Take colchicine only if needed.  Update me if you have a lot of gout flares.  Lay off the liver pudding before a lab check.  Fasting labs.  We'll go from there.  Take care.  Glad to see you.

## 2016-02-20 NOTE — Assessment & Plan Note (Signed)
Still alternating in and out of AF per patient report.  Still on anticoagulation.  No bleeding.  No CP.  Not SOB.  Trace BLE edema.  Still needs weight loss.  Continue anticoagulation.

## 2016-02-20 NOTE — Progress Notes (Signed)
Pre visit review using our clinic review tool, if applicable. No additional management support is needed unless otherwise documented below in the visit note. 

## 2016-02-20 NOTE — Progress Notes (Addendum)
Hearing screening - failed.  Declined hearing aids.   D/w pt.   Gout.  Taking prn colchicine and allopurinol.  Not consistently taking either.  Usually better in a few days with prn med use, when he has a flare.  D/w pt.  Uric acid up.  D/w pt.  See AVS.    Advance directive d/w pt.  Daughters Izora Gala and [place] equally designated if patient were incapacitated.    OSA on CPAP.  Compliant.  Sleeping well.  Used nightly.   H/o gastric ulcer on prev EGD, not seen on f/u EGD.  S/p dilation.  Still on PPI.  No heartburn since on PPI.  No blood in stool.    HLD.  Prev angioplasty noted.  Lipids elevated, d/w pt.  He at liver pudding and eggs before the last lab draw.  Not on statin currently.  Intolerant of atorvastatin.   AF.  Still alternating in and out of AF per patient report.  Still on anticoagulation.  No bleeding.  No CP.  Not SOB.  Trace BLE edema.    PMH and SH reviewed  ROS: Per HPI unless specifically indicated in ROS section   Meds, vitals, and allergies reviewed.   GEN: nad, alert and oriented HEENT: mucous membranes moist NECK: supple w/o LA CV: rrr PULM: ctab, no inc wob ABD: soft, +bs EXT: trace BLE edema SKIN: no acute rash

## 2016-02-24 ENCOUNTER — Other Ambulatory Visit (INDEPENDENT_AMBULATORY_CARE_PROVIDER_SITE_OTHER): Payer: Medicare Other

## 2016-02-24 DIAGNOSIS — E785 Hyperlipidemia, unspecified: Secondary | ICD-10-CM | POA: Diagnosis not present

## 2016-02-24 LAB — LIPID PANEL
CHOL/HDL RATIO: 7
Cholesterol: 185 mg/dL (ref 0–200)
HDL: 26.3 mg/dL — AB (ref >39.00)
NONHDL: 158.43
Triglycerides: 282 mg/dL — ABNORMAL HIGH (ref 0.0–149.0)
VLDL: 56.4 mg/dL — AB (ref 0.0–40.0)

## 2016-02-24 LAB — LDL CHOLESTEROL, DIRECT: Direct LDL: 106 mg/dL

## 2016-02-26 ENCOUNTER — Telehealth: Payer: Self-pay | Admitting: Family Medicine

## 2016-02-26 NOTE — Telephone Encounter (Signed)
Thanks.  Will release when reviewed.

## 2016-02-26 NOTE — Telephone Encounter (Signed)
Patient's daughter,Tabitha,called to get lab results.  She said she'll be at work until 5:30.  She asked that the results be put on my chart or leave a detailed message on her voice mail.

## 2016-02-27 ENCOUNTER — Telehealth: Payer: Self-pay | Admitting: Family Medicine

## 2016-02-27 NOTE — Telephone Encounter (Signed)
Patient's daughter wants patient's last lab results.  If she does not answer you can leave a detailed message on her answering machine.

## 2016-02-27 NOTE — Telephone Encounter (Signed)
Daughter advised.

## 2016-02-28 ENCOUNTER — Other Ambulatory Visit: Payer: Self-pay | Admitting: Family Medicine

## 2016-02-28 MED ORDER — PRAVASTATIN SODIUM 20 MG PO TABS: 20.0000 mg | ORAL_TABLET | Freq: Every day | ORAL | 3 refills | Status: DC

## 2016-03-11 ENCOUNTER — Ambulatory Visit (INDEPENDENT_AMBULATORY_CARE_PROVIDER_SITE_OTHER): Payer: Medicare Other | Admitting: *Deleted

## 2016-03-11 DIAGNOSIS — I481 Persistent atrial fibrillation: Secondary | ICD-10-CM | POA: Diagnosis not present

## 2016-03-11 DIAGNOSIS — Z5181 Encounter for therapeutic drug level monitoring: Secondary | ICD-10-CM | POA: Diagnosis not present

## 2016-03-11 DIAGNOSIS — I2510 Atherosclerotic heart disease of native coronary artery without angina pectoris: Secondary | ICD-10-CM

## 2016-03-11 DIAGNOSIS — Z9861 Coronary angioplasty status: Secondary | ICD-10-CM

## 2016-03-11 DIAGNOSIS — I4819 Other persistent atrial fibrillation: Secondary | ICD-10-CM

## 2016-03-11 DIAGNOSIS — I4891 Unspecified atrial fibrillation: Secondary | ICD-10-CM | POA: Diagnosis not present

## 2016-03-11 LAB — POCT INR: INR: 2.3

## 2016-03-23 ENCOUNTER — Telehealth (HOSPITAL_COMMUNITY): Payer: Self-pay | Admitting: Cardiac Rehabilitation

## 2016-03-23 NOTE — Telephone Encounter (Signed)
Phone call to pt to discuss enrolling cardiac rehab. Pt declined.  He is exercising on his own.

## 2016-03-27 DIAGNOSIS — H02831 Dermatochalasis of right upper eyelid: Secondary | ICD-10-CM | POA: Diagnosis not present

## 2016-03-27 DIAGNOSIS — Z9849 Cataract extraction status, unspecified eye: Secondary | ICD-10-CM | POA: Diagnosis not present

## 2016-03-27 DIAGNOSIS — H52223 Regular astigmatism, bilateral: Secondary | ICD-10-CM | POA: Diagnosis not present

## 2016-03-27 DIAGNOSIS — H5203 Hypermetropia, bilateral: Secondary | ICD-10-CM | POA: Diagnosis not present

## 2016-03-27 DIAGNOSIS — H524 Presbyopia: Secondary | ICD-10-CM | POA: Diagnosis not present

## 2016-03-27 DIAGNOSIS — Z961 Presence of intraocular lens: Secondary | ICD-10-CM | POA: Diagnosis not present

## 2016-03-30 ENCOUNTER — Other Ambulatory Visit: Payer: Self-pay | Admitting: Internal Medicine

## 2016-03-30 DIAGNOSIS — R1319 Other dysphagia: Secondary | ICD-10-CM

## 2016-03-30 DIAGNOSIS — K222 Esophageal obstruction: Secondary | ICD-10-CM

## 2016-03-30 DIAGNOSIS — R1310 Dysphagia, unspecified: Secondary | ICD-10-CM

## 2016-04-23 ENCOUNTER — Ambulatory Visit (INDEPENDENT_AMBULATORY_CARE_PROVIDER_SITE_OTHER): Payer: Medicare Other | Admitting: *Deleted

## 2016-04-23 DIAGNOSIS — Z5181 Encounter for therapeutic drug level monitoring: Secondary | ICD-10-CM | POA: Diagnosis not present

## 2016-04-23 DIAGNOSIS — I4891 Unspecified atrial fibrillation: Secondary | ICD-10-CM

## 2016-04-23 DIAGNOSIS — I481 Persistent atrial fibrillation: Secondary | ICD-10-CM

## 2016-04-23 DIAGNOSIS — I4819 Other persistent atrial fibrillation: Secondary | ICD-10-CM

## 2016-04-23 LAB — POCT INR: INR: 2.3

## 2016-04-29 ENCOUNTER — Other Ambulatory Visit: Payer: Self-pay | Admitting: Cardiovascular Disease

## 2016-04-29 ENCOUNTER — Other Ambulatory Visit: Payer: Self-pay | Admitting: Cardiology

## 2016-05-14 ENCOUNTER — Ambulatory Visit (INDEPENDENT_AMBULATORY_CARE_PROVIDER_SITE_OTHER): Payer: Medicare Other | Admitting: Cardiovascular Disease

## 2016-05-14 ENCOUNTER — Encounter: Payer: Self-pay | Admitting: Cardiovascular Disease

## 2016-05-14 VITALS — BP 108/72 | HR 85 | Ht 65.5 in | Wt 211.1 lb

## 2016-05-14 DIAGNOSIS — I481 Persistent atrial fibrillation: Secondary | ICD-10-CM

## 2016-05-14 DIAGNOSIS — R42 Dizziness and giddiness: Secondary | ICD-10-CM

## 2016-05-14 DIAGNOSIS — I2510 Atherosclerotic heart disease of native coronary artery without angina pectoris: Secondary | ICD-10-CM | POA: Diagnosis not present

## 2016-05-14 DIAGNOSIS — I4819 Other persistent atrial fibrillation: Secondary | ICD-10-CM

## 2016-05-14 MED ORDER — LISINOPRIL 20 MG PO TABS: 20.0000 mg | ORAL_TABLET | Freq: Every day | ORAL | 3 refills | Status: DC

## 2016-05-14 NOTE — Patient Instructions (Signed)
Medication Instructions:  Your physician has recommended you make the following change in your medication:  1. STOP Isosorbide MN 2. DECREASE Lisinopri to 20mg  take one tablet by mouth daily  Labwork: No new orders.   Testing/Procedures: No new orders.   Follow-Up: Your physician wants you to follow-up in: 6 MONTHS with Dr Burt Knack.  You will receive a reminder letter in the mail two months in advance. If you don't receive a letter, please call our office to schedule the follow-up appointment.   Any Other Special Instructions Will Be Listed Below (If Applicable).     If you need a refill on your cardiac medications before your next appointment, please call your pharmacy.

## 2016-05-14 NOTE — Progress Notes (Signed)
Cardiology Office Note Date:  05/14/2016   ID:  [name], DOB 05-01-1930, MRN [id number]  PCP:  Elsie Stain, MD  Cardiologist:  Sherren Mocha, MD    Chief Complaint  Patient presents with  . Fatigue    History of Present Illness: [name] is a 83 y.o. male who presents for follow-up of coronary artery disease and atrial fibrillation. The patient presented in August 2017 with acute coronary syndrome and was found to have occlusion of the small obtuse marginal branch treated with balloon angioplasty. His LV function has been preserved with an LVEF of 55-60%. The patient is chronically anticoagulated with warfarin for paroxysmal atrial fibrillation.  The patient is doing okay. He is here alone today. Denies chest pain, chest pressure, or shortness of breath. Denies palpitations. Has episodes of lightheadedness/feeling swimmy headed after taking his medicines. No syncope. No other complaints today.  Past Medical History:  Diagnosis Date  . Bradycardia    a. nocturnal with pauses overnight on tele likely due to OSA 10/2015.  [name] CKD (chronic kidney disease), stage III   . Coronary artery disease    a. prev nonobst. b. LHC 10/24/15: occlusion of small diffusely diseased OM2 s/p balloon angioplasty, mild nonobstructive disease of mLAD and mRCA, normal LVEDP  . Diverticulosis   . Esophageal reflux   . Esophageal stricture from GERD 06/08/2011   With stricture at GE junction on EGD, dilated 06/2011   . Gastric ulcer    on EGD 2013  . Gout, unspecified   . Hyperlipidemia   . Hypertension   . Obesity, unspecified   . Orthopnea   . OSA on CPAP   . Paroxysmal nocturnal dyspnea   . Persistent atrial fibrillation ([place])   . Vertigo     Past Surgical History:  Procedure Laterality Date  . BALLOON DILATION N/A 10/22/2014   Procedure: BALLOON DILATION;  Surgeon: [name], MD;  Location: [facility];  Service: Endoscopy;  Laterality: N/A;  . CARDIAC CATHETERIZATION  07/2008    . CARDIAC CATHETERIZATION N/A 10/24/2015   Procedure: Left Heart Cath and Coronary Angiography;  Surgeon: [name], MD;  Location: [facility];  Service: Cardiovascular;  Laterality: N/A;  . CARDIAC CATHETERIZATION N/A 10/24/2015   Procedure: Coronary Balloon Angioplasty;  Surgeon: [name], MD;  Location: [facility];  Service: Cardiovascular;  Laterality: N/A;  . Carotid Dopplers  09/2009   no sig extracranial stenosis and vertebral arteries had antegrade flow  . CATARACT EXTRACTION W/ INTRAOCULAR LENS  IMPLANT, BILATERAL Bilateral   . ESOPHAGOGASTRODUODENOSCOPY (EGD) WITH ESOPHAGEAL DILATION     "I've had it stretched 3 times" (10/23/2015)  . ESOPHAGOGASTRODUODENOSCOPY (EGD) WITH PROPOFOL N/A 10/22/2014   Procedure: ESOPHAGOGASTRODUODENOSCOPY (EGD) WITH PROPOFOL;  Surgeon: [name], MD;  Location: [facility];  Service: Endoscopy;  Laterality: N/A;  . MRI of brain  09/2009   chronic microvascular ischemia  . OSA Sleep Study     per Dr. Brett Fairy  . SHOULDER ARTHROSCOPY W/ ROTATOR CUFF REPAIR Right 2008    Current Outpatient Prescriptions  Medication Sig Dispense Refill  . aspirin 81 MG chewable tablet Chew 1 tablet (81 mg total) by mouth daily. 30 tablet 0  . colchicine (COLCRYS) 0.6 MG tablet Take 0.6 mg by mouth daily as needed (for gout).     [name] diltiazem (CARDIZEM CD) 180 MG 24 hr capsule TAKE 1 CAPSULE (180 MG TOTAL) BY MOUTH DAILY. 30 capsule 5  . fluticasone (FLONASE) 50 MCG/ACT  nasal spray Place 1-2 sprays into both nostrils daily as needed for allergies.     . furosemide (LASIX) 20 MG tablet TAKE 1 TABLET (20 MG TOTAL) BY MOUTH DAILY. 30 tablet 5  . lisinopril (PRINIVIL,ZESTRIL) 20 MG tablet Take 1 tablet (20 mg total) by mouth daily. 90 tablet 3  . metoprolol tartrate (LOPRESSOR) 25 MG tablet TAKE 1/2 TABLET BY MOUTH TWICE A DAY 30 tablet 9  . nitroGLYCERIN (NITROSTAT) 0.4 MG SL tablet Place 1 tablet (0.4 mg total) under the tongue every 5 (five)  minutes x 3 doses as needed for chest pain. 25 tablet 2  . omeprazole (PRILOSEC) 40 MG capsule Take 1 capsule (40 mg total) by mouth daily.    . pravastatin (PRAVACHOL) 20 MG tablet Take 1 tablet (20 mg total) by mouth daily. 90 tablet 3  . warfarin (COUMADIN) 4 MG tablet TAKE AS DIRECTED BY COUMADIN CLINIC 40 tablet 3   No current facility-administered medications for this visit.     Allergies:   Penicillins; Atorvastatin; Biaxin [clarithromycin]; and Doxycycline   Social History:  The patient  reports that he quit smoking about 47 years ago. His smoking use included Cigarettes. He has a 0.48 pack-year smoking history. He has never used smokeless tobacco. He reports that he does not drink alcohol or use drugs.   Family History:  The patient's family history includes Dementia in his father; Heart disease in his brother; Hypertension in his father and other; Pneumonia in his father; Stomach cancer in his mother.   ROS:  Please see the history of present illness.   All other systems are reviewed and negative.   PHYSICAL EXAM: VS:  BP 108/72 (BP Location: Left Arm)   Pulse 85   Ht 5' 5.5" (1.664 m)   Wt 211 lb 1.9 oz (95.8 kg)   BMI 34.60 kg/m  , BMI Body mass index is 34.6 kg/m. GEN: Well nourished, well developed, elderly obese male in no acute distress  HEENT: normal  Neck: no JVD, no masses. No carotid bruits Cardiac: irregular without murmur or gallop                Respiratory:  clear to auscultation bilaterally, normal work of breathing GI: soft, nontender, nondistended, + BS MS: no deformity or atrophy  Ext: no pretibial edema, pedal pulses 2+= bilaterally, left ankle edema noted Skin: warm and dry, no rash Neuro:  Strength and sensation are intact Psych: euthymic mood, full affect  EKG:  EKG is ordered today. The ekg ordered today shows atrial fibrillation 85 bpm, otherwise normal.  Recent Labs: 11/08/2015: B Natriuretic Peptide 121.1 11/09/2015: Hemoglobin 14.0;  Platelets 168 02/13/2016: ALT 22; BUN 23; Creatinine, Ser 1.41; Potassium 4.2; Sodium 138; TSH 1.56   Lipid Panel     Component Value Date/Time   CHOL 185 02/24/2016 0915   TRIG 282.0 (H) 02/24/2016 0915   HDL 26.30 (L) 02/24/2016 0915   CHOLHDL 7 02/24/2016 0915   VLDL 56.4 (H) 02/24/2016 0915   LDLCALC 82 10/24/2015 0430   LDLDIRECT 106.0 02/24/2016 0915      Wt Readings from Last 3 Encounters:  05/14/16 211 lb 1.9 oz (95.8 kg)  02/20/16 216 lb 4 oz (98.1 kg)  02/13/16 211 lb (95.7 kg)     Cardiac Studies Reviewed: 2D Echo 10-28-2015: Left ventricle:  The cavity size was normal. Wall thickness was normal. Systolic function was normal. The estimated ejection fraction was in the range of 55% to 60%. Wall motion was  normal; there were no regional wall motion abnormalities.  ------------------------------------------------------------------- Aorta:  Aortic root: The aortic root was normal in size. Ascending aorta: The ascending aorta was normal in size.  ------------------------------------------------------------------- Mitral valve:   Moderately calcified annulus. Mildly thickened, mildly calcified leaflets .  Doppler:     Valve area by pressure half-time: 2.72 cm^2. Indexed valve area by pressure half-time: 1.25 cm^2/m^2. Valve area by continuity equation (using LVOT flow): 1.28 cm^2. Indexed valve area by continuity equation (using LVOT flow): 0.59 cm^2/m^2.    Mean gradient (D): 5 mm Hg. Peak gradient (D): 9 mm Hg.  ------------------------------------------------------------------- Left atrium:  The atrium was moderately dilated.  ------------------------------------------------------------------- Right ventricle:  The cavity size was mildly dilated. Systolic function was mildly reduced.  ------------------------------------------------------------------- Pulmonic valve:    The valve appears to be grossly normal. Doppler:  There was no significant  regurgitation.  ------------------------------------------------------------------- Tricuspid valve:   The valve appears to be grossly normal. Doppler:  There was trivial regurgitation.  ------------------------------------------------------------------- Pulmonary artery:   Systolic pressure was within the normal range.   ------------------------------------------------------------------- Right atrium:  The atrium was normal in size.  ------------------------------------------------------------------- Pericardium:  There was no pericardial effusion.  Cath 10-24-2015: Conclusion     Mid RCA lesion, 15 %stenosed.  Ost 2nd Mrg to 2nd Mrg lesion, 50 %stenosed.  Mid LAD lesion, 30 %stenosed.  2nd Mrg lesion, 100 %stenosed.  Post intervention, there is a 30% residual stenosis.  LV end diastolic pressure is normal.   1.  Occlusion of small, diffusely diseased OM2 branch. 2.  Mild, non-obstructive coronary artery disease involving the mid LAD and mid RCA. 3.  Normal left ventricular filling pressure (LVEDP 13 mmHg). 4.  Successful balloon angioplasty of OM2 using 2.0 x 12 mm balloon with improvement in stenosis from 100% to 30% with restoration of TIMI-3 flow.  Stent was not placed due to the small vessel size with diffuse disease extending to the ostium.  Plan: 1.  Dual antiplatelet therapy for at least 1 month.  Given that patient is on warfarin as well, would recommend switching from ticagrelor (loaded in cath lab) to clopidogrel to reduce risk for bleeding complications. 2.  Restart warfarin tomorrow. 3.  Aggressive secondary prevention. 4.  Continue IV hydration x 6 hours, with close follow-up of renal status.     ASSESSMENT AND PLAN: 1.  Atrial fibrillation, paroxysmal: The patient is stable. He is in rate controlled atrial fibrillation today. He continues on warfarin without bleeding problems. We'll continue current management.  2. Coronary artery disease, native  vessel: The patient has no symptoms of angina. Because of his side effects of medication with "swimmy headedness" after taking medicine, I'm going to stop isosorbide. He really hasn't had chronic angina and I don't think he needs a long-acting nitrate.  3. Hypertension: Blood pressure remains in the low-normal range. I suspect this is playing a role in his symptoms considering his advanced age. Will also reduce lisinopril to 20 mg. No other medicine changes are made today.  4. Hyperlipidemia: Intolerant to statin drugs.  Current medicines are reviewed with the patient today.  The patient does not have concerns regarding medicines.  Labs/ tests ordered today include:   Orders Placed This Encounter  Procedures  . EKG 12-Lead    Disposition:   FU 6 months  2 changes made today:  STOP Imdur  DECREASE lisinopril to 20 mg daily  Signed, Sherren Mocha, MD  05/14/2016 1:18 PM    [facility]  [address] [place], [place]  [address] Phone: [phone]; Fax: [phone]

## 2016-06-04 ENCOUNTER — Ambulatory Visit (INDEPENDENT_AMBULATORY_CARE_PROVIDER_SITE_OTHER): Payer: Medicare Other | Admitting: *Deleted

## 2016-06-04 DIAGNOSIS — I4891 Unspecified atrial fibrillation: Secondary | ICD-10-CM | POA: Diagnosis not present

## 2016-06-04 DIAGNOSIS — Z5181 Encounter for therapeutic drug level monitoring: Secondary | ICD-10-CM | POA: Diagnosis not present

## 2016-06-04 DIAGNOSIS — I481 Persistent atrial fibrillation: Secondary | ICD-10-CM | POA: Diagnosis not present

## 2016-06-04 DIAGNOSIS — I2510 Atherosclerotic heart disease of native coronary artery without angina pectoris: Secondary | ICD-10-CM

## 2016-06-04 DIAGNOSIS — I4819 Other persistent atrial fibrillation: Secondary | ICD-10-CM

## 2016-06-04 LAB — POCT INR: INR: 3.2

## 2016-06-10 ENCOUNTER — Ambulatory Visit: Payer: Medicare Other | Admitting: Family Medicine

## 2016-06-15 ENCOUNTER — Ambulatory Visit (INDEPENDENT_AMBULATORY_CARE_PROVIDER_SITE_OTHER): Payer: Medicare Other | Admitting: Family Medicine

## 2016-06-15 ENCOUNTER — Encounter: Payer: Self-pay | Admitting: Family Medicine

## 2016-06-15 ENCOUNTER — Ambulatory Visit (INDEPENDENT_AMBULATORY_CARE_PROVIDER_SITE_OTHER)
Admission: RE | Admit: 2016-06-15 | Discharge: 2016-06-15 | Disposition: A | Discharge status: Home / Self Care | Payer: Medicare Other | Source: Ambulatory Visit | Attending: Family Medicine | Admitting: Family Medicine

## 2016-06-15 VITALS — BP 110/68 | HR 88 | Temp 98.3°F | Wt 211.8 lb

## 2016-06-15 DIAGNOSIS — M546 Pain in thoracic spine: Secondary | ICD-10-CM

## 2016-06-15 DIAGNOSIS — I2510 Atherosclerotic heart disease of native coronary artery without angina pectoris: Secondary | ICD-10-CM

## 2016-06-15 NOTE — Progress Notes (Signed)
Pre visit review using our clinic review tool, if applicable. No additional management support is needed unless otherwise documented below in the visit note. 

## 2016-06-15 NOTE — Assessment & Plan Note (Signed)
When he has pain in his back, he'll have some tingling in the B 3-5th fingers.  The back pain is clearly below the scapula B and he has no neck pain on ROM and no induced back/hand sx on neck ROM.  See notes on imaging.  Okay for outpatient f/u.

## 2016-06-15 NOTE — Progress Notes (Signed)
He rolled over in bed and felt something pop.  This was a few months ago.  Better with tylenol, worse w/o med.  No pain at rest but pain with stooping over or getting out of a chair/out of bed or twisting.  Pain in mid back, horizontal line, inferior to the scapula B.  No rash, no bruising, no trauma, no falls.  No B/B sx.  No leg weakness.  No leg sx/pain.  No radiation around tot he abdomen.  When he has pain in his back, he'll have some tingling in the B 3-5th fingers.    He couldn't take pravastatin due to aches.  That was separate from the above.    ROS: Per HPI unless specifically indicated in ROS section   Meds, vitals, and allergies reviewed.   nad ncat Neck supple no LA, normal ROM rrr ctab abd soft Back not ttp, no cva pain, no bruising, no rash.  Ext w/o edema Normal S/S ext x4, no weakness.

## 2016-06-15 NOTE — Patient Instructions (Signed)
Take 2 tylenol twice a day for now and we'll be in touch.  Take care.  Glad to see you.

## 2016-06-16 ENCOUNTER — Other Ambulatory Visit: Payer: Self-pay | Admitting: Family Medicine

## 2016-06-16 DIAGNOSIS — R202 Paresthesia of skin: Secondary | ICD-10-CM

## 2016-06-26 ENCOUNTER — Ambulatory Visit
Admission: RE | Admit: 2016-06-26 | Discharge: 2016-06-26 | Disposition: A | Discharge status: Home / Self Care | Payer: Medicare Other | Source: Ambulatory Visit | Attending: Family Medicine | Admitting: Family Medicine

## 2016-06-26 DIAGNOSIS — R202 Paresthesia of skin: Secondary | ICD-10-CM

## 2016-06-26 DIAGNOSIS — M4802 Spinal stenosis, cervical region: Secondary | ICD-10-CM | POA: Diagnosis not present

## 2016-06-28 ENCOUNTER — Other Ambulatory Visit: Payer: Self-pay | Admitting: Family Medicine

## 2016-06-28 DIAGNOSIS — R202 Paresthesia of skin: Secondary | ICD-10-CM

## 2016-06-28 DIAGNOSIS — M546 Pain in thoracic spine: Secondary | ICD-10-CM

## 2016-07-02 ENCOUNTER — Other Ambulatory Visit: Payer: Self-pay | Admitting: Family Medicine

## 2016-07-02 DIAGNOSIS — R202 Paresthesia of skin: Secondary | ICD-10-CM

## 2016-07-02 DIAGNOSIS — M546 Pain in thoracic spine: Secondary | ICD-10-CM

## 2016-07-09 ENCOUNTER — Ambulatory Visit (INDEPENDENT_AMBULATORY_CARE_PROVIDER_SITE_OTHER): Payer: Medicare Other | Admitting: *Deleted

## 2016-07-09 DIAGNOSIS — I4891 Unspecified atrial fibrillation: Secondary | ICD-10-CM

## 2016-07-09 DIAGNOSIS — I2510 Atherosclerotic heart disease of native coronary artery without angina pectoris: Secondary | ICD-10-CM

## 2016-07-09 DIAGNOSIS — Z5181 Encounter for therapeutic drug level monitoring: Secondary | ICD-10-CM | POA: Diagnosis not present

## 2016-07-09 DIAGNOSIS — I4819 Other persistent atrial fibrillation: Secondary | ICD-10-CM

## 2016-07-09 DIAGNOSIS — I481 Persistent atrial fibrillation: Secondary | ICD-10-CM

## 2016-07-09 LAB — POCT INR: INR: 3.7

## 2016-07-21 DIAGNOSIS — M542 Cervicalgia: Secondary | ICD-10-CM | POA: Diagnosis not present

## 2016-07-21 DIAGNOSIS — M5030 Other cervical disc degeneration, unspecified cervical region: Secondary | ICD-10-CM | POA: Diagnosis not present

## 2016-07-21 DIAGNOSIS — M47812 Spondylosis without myelopathy or radiculopathy, cervical region: Secondary | ICD-10-CM | POA: Diagnosis not present

## 2016-07-21 DIAGNOSIS — M546 Pain in thoracic spine: Secondary | ICD-10-CM | POA: Diagnosis not present

## 2016-07-30 ENCOUNTER — Ambulatory Visit (INDEPENDENT_AMBULATORY_CARE_PROVIDER_SITE_OTHER): Payer: Medicare Other | Admitting: *Deleted

## 2016-07-30 DIAGNOSIS — I481 Persistent atrial fibrillation: Secondary | ICD-10-CM

## 2016-07-30 DIAGNOSIS — I4819 Other persistent atrial fibrillation: Secondary | ICD-10-CM

## 2016-07-30 DIAGNOSIS — I2510 Atherosclerotic heart disease of native coronary artery without angina pectoris: Secondary | ICD-10-CM | POA: Diagnosis not present

## 2016-07-30 DIAGNOSIS — I4891 Unspecified atrial fibrillation: Secondary | ICD-10-CM | POA: Diagnosis not present

## 2016-07-30 DIAGNOSIS — Z5181 Encounter for therapeutic drug level monitoring: Secondary | ICD-10-CM

## 2016-07-30 LAB — POCT INR: INR: 2.8

## 2016-08-27 ENCOUNTER — Other Ambulatory Visit: Payer: Self-pay | Admitting: Cardiovascular Disease

## 2016-08-27 ENCOUNTER — Ambulatory Visit (INDEPENDENT_AMBULATORY_CARE_PROVIDER_SITE_OTHER): Payer: Medicare Other | Admitting: *Deleted

## 2016-08-27 DIAGNOSIS — I481 Persistent atrial fibrillation: Secondary | ICD-10-CM

## 2016-08-27 DIAGNOSIS — I4819 Other persistent atrial fibrillation: Secondary | ICD-10-CM

## 2016-08-27 DIAGNOSIS — I4891 Unspecified atrial fibrillation: Secondary | ICD-10-CM | POA: Diagnosis not present

## 2016-08-27 DIAGNOSIS — I2510 Atherosclerotic heart disease of native coronary artery without angina pectoris: Secondary | ICD-10-CM | POA: Diagnosis not present

## 2016-08-27 DIAGNOSIS — Z5181 Encounter for therapeutic drug level monitoring: Secondary | ICD-10-CM

## 2016-08-27 LAB — POCT INR: INR: 2.6

## 2016-09-28 ENCOUNTER — Other Ambulatory Visit: Payer: Self-pay | Admitting: Cardiovascular Disease

## 2016-09-28 ENCOUNTER — Ambulatory Visit (INDEPENDENT_AMBULATORY_CARE_PROVIDER_SITE_OTHER): Payer: Medicare Other

## 2016-09-28 ENCOUNTER — Other Ambulatory Visit: Payer: Self-pay | Admitting: Internal Medicine

## 2016-09-28 DIAGNOSIS — K222 Esophageal obstruction: Secondary | ICD-10-CM

## 2016-09-28 DIAGNOSIS — R1310 Dysphagia, unspecified: Secondary | ICD-10-CM

## 2016-09-28 DIAGNOSIS — I4891 Unspecified atrial fibrillation: Secondary | ICD-10-CM

## 2016-09-28 DIAGNOSIS — I2510 Atherosclerotic heart disease of native coronary artery without angina pectoris: Secondary | ICD-10-CM

## 2016-09-28 DIAGNOSIS — Z5181 Encounter for therapeutic drug level monitoring: Secondary | ICD-10-CM

## 2016-09-28 DIAGNOSIS — I481 Persistent atrial fibrillation: Secondary | ICD-10-CM | POA: Diagnosis not present

## 2016-09-28 DIAGNOSIS — I4819 Other persistent atrial fibrillation: Secondary | ICD-10-CM

## 2016-09-28 DIAGNOSIS — R1319 Other dysphagia: Secondary | ICD-10-CM

## 2016-09-28 LAB — POCT INR: INR: 3.4

## 2016-10-07 DIAGNOSIS — L814 Other melanin hyperpigmentation: Secondary | ICD-10-CM | POA: Diagnosis not present

## 2016-10-07 DIAGNOSIS — L570 Actinic keratosis: Secondary | ICD-10-CM | POA: Diagnosis not present

## 2016-10-07 DIAGNOSIS — L821 Other seborrheic keratosis: Secondary | ICD-10-CM | POA: Diagnosis not present

## 2016-10-07 DIAGNOSIS — D225 Melanocytic nevi of trunk: Secondary | ICD-10-CM | POA: Diagnosis not present

## 2016-10-19 ENCOUNTER — Ambulatory Visit (INDEPENDENT_AMBULATORY_CARE_PROVIDER_SITE_OTHER): Payer: Medicare Other | Admitting: Pharmacist

## 2016-10-19 DIAGNOSIS — Z5181 Encounter for therapeutic drug level monitoring: Secondary | ICD-10-CM | POA: Diagnosis not present

## 2016-10-19 DIAGNOSIS — I4891 Unspecified atrial fibrillation: Secondary | ICD-10-CM

## 2016-10-19 DIAGNOSIS — I4819 Other persistent atrial fibrillation: Secondary | ICD-10-CM

## 2016-10-19 DIAGNOSIS — I481 Persistent atrial fibrillation: Secondary | ICD-10-CM | POA: Diagnosis not present

## 2016-10-19 DIAGNOSIS — I2510 Atherosclerotic heart disease of native coronary artery without angina pectoris: Secondary | ICD-10-CM

## 2016-10-19 LAB — POCT INR: INR: 2

## 2016-10-27 ENCOUNTER — Other Ambulatory Visit: Payer: Self-pay | Admitting: Cardiovascular Disease

## 2016-11-17 ENCOUNTER — Ambulatory Visit (INDEPENDENT_AMBULATORY_CARE_PROVIDER_SITE_OTHER): Payer: Medicare Other | Admitting: *Deleted

## 2016-11-17 DIAGNOSIS — Z5181 Encounter for therapeutic drug level monitoring: Secondary | ICD-10-CM

## 2016-11-17 DIAGNOSIS — I4891 Unspecified atrial fibrillation: Secondary | ICD-10-CM | POA: Diagnosis not present

## 2016-11-17 DIAGNOSIS — I481 Persistent atrial fibrillation: Secondary | ICD-10-CM | POA: Diagnosis not present

## 2016-11-17 DIAGNOSIS — I4819 Other persistent atrial fibrillation: Secondary | ICD-10-CM

## 2016-11-17 LAB — POCT INR: INR: 3.6

## 2016-11-23 ENCOUNTER — Telehealth: Payer: Self-pay

## 2016-11-23 ENCOUNTER — Ambulatory Visit (INDEPENDENT_AMBULATORY_CARE_PROVIDER_SITE_OTHER): Payer: Medicare Other | Admitting: Family Medicine

## 2016-11-23 ENCOUNTER — Encounter: Payer: Self-pay | Admitting: Family Medicine

## 2016-11-23 DIAGNOSIS — I2510 Atherosclerotic heart disease of native coronary artery without angina pectoris: Secondary | ICD-10-CM

## 2016-11-23 DIAGNOSIS — J069 Acute upper respiratory infection, unspecified: Secondary | ICD-10-CM | POA: Diagnosis not present

## 2016-11-23 MED ORDER — AZITHROMYCIN 250 MG PO TABS: ORAL_TABLET | ORAL | 0 refills | Status: DC

## 2016-11-23 NOTE — Patient Instructions (Addendum)
Start zithromax.  Use the OTC cough medicine.  Call the coumadin clinic if you don't hear from them first.   I sent them a message.  Keep using neosporin in the meantime.  Rest and fluids in the meantime.  Take care.  Glad to see you.

## 2016-11-23 NOTE — Telephone Encounter (Signed)
Tabatha pts daughter left v/m (DPR signed) pt was seen earlier today; pt got abx and Tabatha wants to know if pt could get some eye gtts for eye. tabatha request cb.[facility]

## 2016-11-23 NOTE — Progress Notes (Signed)
Sick for about 2 weeks, cough then more sputum and facial pain.  Maxillary pain, upper mouth pain but not upper tooth pain. Tried OTC cough meds.  B ear pressure.  No fevers now but did have fevers about 1 week ago.  Taking tylenol in the meantime. No vomiting, no diarrhea.  No rash.    He had a skin lesion on the L medial thigh, never drained any pus.  Less red with neosporin use.  Unclear if he had an insect bite initially. Prev itched but not painful.  Neither painful or itchy now.    Meds, vitals, and allergies reviewed.   ROS: Per HPI unless specifically indicated in ROS section   GEN: nad, alert and oriented HEENT: mucous membranes moist, tm w/o erythema, nasal exam w/o erythema, clear discharge noted,  OP with cobblestoning, L conjunctival irritation noted.  NECK: supple w/o LA CV: rrr.   PULM: ctab, no inc wob EXT: no edema SKIN: 3x2cm blanching red area on the L medial thigh, no fluctuant mass.

## 2016-11-24 ENCOUNTER — Other Ambulatory Visit: Payer: Self-pay | Admitting: Family Medicine

## 2016-11-24 ENCOUNTER — Telehealth: Payer: Self-pay | Admitting: Family Medicine

## 2016-11-24 DIAGNOSIS — J069 Acute upper respiratory infection, unspecified: Secondary | ICD-10-CM | POA: Insufficient documentation

## 2016-11-24 NOTE — Telephone Encounter (Signed)
-----   Message from Zenovia Jarred, RN sent at 11/23/2016  3:47 PM EDT ----- Regarding: RE: coumadin patient, started on zmax Thank you for informing us, I spoke with pt.   Tiffany   ----- Message ----- From: Tonia Ghent, MD Sent: 11/23/2016   3:37 PM To: Zenovia Jarred, RN Subject: coumadin patient, started on zmax              Started zmax today.  I needed your input on this.  Thanks.   Brigitte Pulse

## 2016-11-24 NOTE — Telephone Encounter (Signed)
pts daughter left message at triage stating pt woke up with a gout flare this am. She requesting medication refill, but med is on Hx list only. Last OV  11/23/2016. pls advise

## 2016-11-24 NOTE — Telephone Encounter (Signed)
Electronic refill request.  Last office visit:   11/23/2016 Last Filled:   ? Please advise.

## 2016-11-24 NOTE — Telephone Encounter (Signed)
Wouldn't do that since already starting oral abx.  Thanks.

## 2016-11-24 NOTE — Telephone Encounter (Signed)
Daughter advised.

## 2016-11-24 NOTE — Assessment & Plan Note (Addendum)
Nontoxic. Okay for outpatient follow-up. Left conjunctival injection noted, would not start eyedrops since we are going to start him on oral antibiotics. I notified the Coumadin clinic about his antibiotic start.  The skin lesion of the left medial thigh was marked. Small area. Nonfluctuant. Has improved with using Neosporin. Continue Neosporin use.

## 2016-11-24 NOTE — Telephone Encounter (Signed)
Sent. Thanks.   

## 2016-11-26 DIAGNOSIS — M25522 Pain in left elbow: Secondary | ICD-10-CM | POA: Diagnosis not present

## 2016-11-26 DIAGNOSIS — M25572 Pain in left ankle and joints of left foot: Secondary | ICD-10-CM | POA: Diagnosis not present

## 2016-12-02 ENCOUNTER — Ambulatory Visit (INDEPENDENT_AMBULATORY_CARE_PROVIDER_SITE_OTHER): Payer: Medicare Other | Admitting: *Deleted

## 2016-12-02 ENCOUNTER — Ambulatory Visit (INDEPENDENT_AMBULATORY_CARE_PROVIDER_SITE_OTHER): Payer: Medicare Other | Admitting: Cardiovascular Disease

## 2016-12-02 ENCOUNTER — Encounter: Payer: Self-pay | Admitting: Cardiovascular Disease

## 2016-12-02 VITALS — BP 140/80 | HR 92 | Ht 66.0 in | Wt 201.4 lb

## 2016-12-02 DIAGNOSIS — I4891 Unspecified atrial fibrillation: Secondary | ICD-10-CM

## 2016-12-02 DIAGNOSIS — I4819 Other persistent atrial fibrillation: Secondary | ICD-10-CM

## 2016-12-02 DIAGNOSIS — I481 Persistent atrial fibrillation: Secondary | ICD-10-CM

## 2016-12-02 DIAGNOSIS — Z5181 Encounter for therapeutic drug level monitoring: Secondary | ICD-10-CM

## 2016-12-02 DIAGNOSIS — I2510 Atherosclerotic heart disease of native coronary artery without angina pectoris: Secondary | ICD-10-CM | POA: Diagnosis not present

## 2016-12-02 DIAGNOSIS — I10 Essential (primary) hypertension: Secondary | ICD-10-CM

## 2016-12-02 LAB — POCT INR: INR: 1.8

## 2016-12-02 NOTE — Patient Instructions (Signed)

## 2016-12-02 NOTE — Progress Notes (Signed)
Cardiology Office Note Date:  12/02/2016   ID:  [name], DOB 1930/11/29, MRN [id number]  PCP:  Tonia Ghent, MD  Cardiologist:  Sherren Mocha, MD    Chief Complaint  Patient presents with  . Shortness of Breath     History of Present Illness: [name] is a 83 y.o. male who presents for follow-up of coronary artery disease and atrial fibrillation. The patient presented in 2017 with acute coronary syndrome and was found to have occlusion of the small obtuse marginal branch treated with balloon angioplasty. His LV function has been preserved with an LVEF of 55-60%. The patient is chronically anticoagulated with warfarin for paroxysmal atrial fibrillation.  The patient is here alone today. He's had a recent upper respiratory tract infection but otherwise has been doing well. He denies chest pain or shortness of breath. No edema or heart palpitations. He brings in blood pressure readings which are in a good range except for a few readings in the 150s over 90s. Over 90% of his readings are at goal. Past Medical History:  Diagnosis Date  . Bradycardia    a. nocturnal with pauses overnight on tele likely due to OSA 10/2015.  [name] CKD (chronic kidney disease), stage III   . Coronary artery disease    a. prev nonobst. b. LHC 10/24/15: occlusion of small diffusely diseased OM2 s/p balloon angioplasty, mild nonobstructive disease of mLAD and mRCA, normal LVEDP  . Diverticulosis   . Esophageal reflux   . Esophageal stricture from GERD 06/08/2011   With stricture at GE junction on EGD, dilated 06/2011   . Gastric ulcer    on EGD 2013  . Gout, unspecified   . Hyperlipidemia   . Hypertension   . Obesity, unspecified   . Orthopnea   . OSA on CPAP   . Paroxysmal nocturnal dyspnea   . Persistent atrial fibrillation ([place])   . Vertigo     Past Surgical History:  Procedure Laterality Date  . BALLOON DILATION N/A 10/22/2014   Procedure: BALLOON DILATION;  Surgeon: [name],  MD;  Location: [facility];  Service: Endoscopy;  Laterality: N/A;  . CARDIAC CATHETERIZATION  07/2008  . CARDIAC CATHETERIZATION N/A 10/24/2015   Procedure: Left Heart Cath and Coronary Angiography;  Surgeon: [name], MD;  Location: [facility];  Service: Cardiovascular;  Laterality: N/A;  . CARDIAC CATHETERIZATION N/A 10/24/2015   Procedure: Coronary Balloon Angioplasty;  Surgeon: [name], MD;  Location: [facility];  Service: Cardiovascular;  Laterality: N/A;  . Carotid Dopplers  09/2009   no sig extracranial stenosis and vertebral arteries had antegrade flow  . CATARACT EXTRACTION W/ INTRAOCULAR LENS  IMPLANT, BILATERAL Bilateral   . ESOPHAGOGASTRODUODENOSCOPY (EGD) WITH ESOPHAGEAL DILATION     "I've had it stretched 3 times" (10/23/2015)  . ESOPHAGOGASTRODUODENOSCOPY (EGD) WITH PROPOFOL N/A 10/22/2014   Procedure: ESOPHAGOGASTRODUODENOSCOPY (EGD) WITH PROPOFOL;  Surgeon: [name], MD;  [facility];  Service: Endoscopy;  Laterality: N/A;  . MRI of brain  09/2009   chronic microvascular ischemia  . OSA Sleep Study     per Dr. Brett Fairy  . SHOULDER ARTHROSCOPY W/ ROTATOR CUFF REPAIR Right 2008    Current Outpatient Prescriptions  Medication Sig Dispense Refill  . colchicine (COLCRYS) 0.6 MG tablet Take 1 tablet (0.6 mg total) by mouth 2 (two) times daily as needed (for gout). Okay to fill with colcrys 60 tablet 3  . diltiazem (CARDIZEM CD) 180 MG 24 hr  capsule TAKE 1 CAPSULE (180 MG TOTAL) BY MOUTH DAILY. 30 capsule 7  . fluticasone (FLONASE) 50 MCG/ACT nasal spray Place 1-2 sprays into both nostrils daily as needed for allergies.     . furosemide (LASIX) 20 MG tablet TAKE 1 TABLET (20 MG TOTAL) BY MOUTH DAILY. 30 tablet 7  . lisinopril (PRINIVIL,ZESTRIL) 20 MG tablet Take 1 tablet (20 mg total) by mouth daily. 90 tablet 3  . metoprolol tartrate (LOPRESSOR) 25 MG tablet TAKE 1/2 TABLET BY MOUTH TWICE A DAY 30 tablet 7  . nitroGLYCERIN (NITROSTAT)  0.4 MG SL tablet Place 1 tablet (0.4 mg total) under the tongue every 5 (five) minutes x 3 doses as needed for chest pain. 25 tablet 2  . omeprazole (PRILOSEC) 40 MG capsule TAKE ONE CAPSULE BY MOUTH EVERY DAY 30 capsule 5  . predniSONE (DELTASONE) 5 MG tablet Take 5 mg by mouth as directed.  0  . warfarin (COUMADIN) 4 MG tablet TAKE AS DIRECTED BY COUMADIN CLINIC 40 tablet 3   No current facility-administered medications for this visit.     Allergies:   Penicillins; Pravastatin; Atorvastatin; Biaxin [clarithromycin]; and Doxycycline   Social History:  The patient  reports that he quit smoking about 47 years ago. His smoking use included Cigarettes. He has a 0.48 pack-year smoking history. He has never used smokeless tobacco. He reports that he does not drink alcohol or use drugs.   Family History:  The patient's family history includes Dementia in his father; Heart disease in his brother; Hypertension in his father and other; Pneumonia in his father; Stomach cancer in his mother.    ROS:  Please see the history of present illness.  Otherwise, review of systems is positive for cough.  All other systems are reviewed and negative.    PHYSICAL EXAM: VS:  BP 140/80   Pulse 92   Ht 5\' 6"  (1.676 m)   Wt 91.4 kg (201 lb 6.4 oz)   SpO2 96%   BMI 32.51 kg/m  , BMI Body mass index is 32.51 kg/m. GEN: Well nourished, well developed, pleasant obese male in no acute distress  HEENT: normal  Neck: no JVD, no masses. No carotid bruits Cardiac: irregularly irregular without murmur or gallop       Respiratory:  clear to auscultation bilaterally, normal work of breathing GI: soft, nontender, nondistended, + BS MS: no deformity or atrophy  Ext: no pretibial edema, pedal pulses 2+= bilaterally Skin: warm and dry, no rash Neuro:  Strength and sensation are intact Psych: euthymic mood, full affect  EKG:  EKG is ordered today. The ekg ordered today shows atrial fibrillation 81 bpm, otherwise within  normal limits.  Recent Labs: 02/13/2016: ALT 22; BUN 23; Creatinine, Ser 1.41; Potassium 4.2; Sodium 138; TSH 1.56   Lipid Panel     Component Value Date/Time   CHOL 185 02/24/2016 0915   TRIG 282.0 (H) 02/24/2016 0915   HDL 26.30 (L) 02/24/2016 0915   CHOLHDL 7 02/24/2016 0915   VLDL 56.4 (H) 02/24/2016 0915   LDLCALC 82 10/24/2015 0430   LDLDIRECT 106.0 02/24/2016 0915      Wt Readings from Last 3 Encounters:  12/02/16 91.4 kg (201 lb 6.4 oz)  11/23/16 94.2 kg (207 lb 12 oz)  06/15/16 96 kg (211 lb 12 oz)     Cardiac Studies Reviewed: 2D Echo 10-28-2015: Left ventricle: The cavity size was normal. Wall thickness was normal. Systolic function was normal. The estimated ejection fraction was in the range  of 55% to 60%. Wall motion was normal; there were no regional wall motion abnormalities.  ------------------------------------------------------------------- Aorta: Aortic root: The aortic root was normal in size. Ascending aorta: The ascending aorta was normal in size.  ------------------------------------------------------------------- Mitral valve: Moderately calcified annulus. Mildly thickened, mildly calcified leaflets . Doppler: Valve area by pressure half-time: 2.72 cm^2. Indexed valve area by pressure half-time: 1.25 cm^2/m^2. Valve area by continuity equation (using LVOT flow): 1.28 cm^2. Indexed valve area by continuity equation (using LVOT flow): 0.59 cm^2/m^2. Mean gradient (D): 5 mm Hg. Peak gradient (D): 9 mm Hg.  ------------------------------------------------------------------- Left atrium: The atrium was moderately dilated.  ------------------------------------------------------------------- Right ventricle: The cavity size was mildly dilated. Systolic function was mildly reduced.  ------------------------------------------------------------------- Pulmonic valve: The valve appears to be grossly normal. Doppler: There was no  significant regurgitation.  ------------------------------------------------------------------- Tricuspid valve: The valve appears to be grossly normal. Doppler: There was trivial regurgitation.  ------------------------------------------------------------------- Pulmonary artery: Systolic pressure was within the normal range.  ------------------------------------------------------------------- Right atrium: The atrium was normal in size.  ------------------------------------------------------------------- Pericardium: There was no pericardial effusion.  Cath 10-24-2015: Conclusion     Mid RCA lesion, 15 %stenosed.  Ost 2nd Mrg to 2nd Mrg lesion, 50 %stenosed.  Mid LAD lesion, 30 %stenosed.  2nd Mrg lesion, 100 %stenosed.  Post intervention, there is a 30% residual stenosis.  LV end diastolic pressure is normal.  1. Occlusion of small, diffusely diseased OM2 branch. 2. Mild, non-obstructive coronary artery disease involving the mid LAD and mid RCA. 3. Normal left ventricular filling pressure (LVEDP 13 mmHg). 4. Successful balloon angioplasty of OM2 using 2.0 x 12 mm balloon with improvement in stenosis from 100% to 30% with restoration of TIMI-3 flow. Stent was not placed due to the small vessel size with diffuse disease extending to the ostium.  Plan: 1. Dual antiplatelet therapy for at least 1 month. Given that patient is on warfarin as well, would recommend switching from ticagrelor (loaded in cath lab) to clopidogrel to reduce risk for bleeding complications. 2. Restart warfarin tomorrow. 3. Aggressive secondary prevention. 4. Continue IV hydration x 6 hours, with close follow-up of renal status.   ASSESSMENT AND PLAN: 1.  Permanent atrial fibrillation: The patient continues to do well with a strategy of rate control and anticoagulation. He has not had any bleeding problems on chronic warfarin.  2. Coronary artery disease, native vessel,  without angina: Patient is on a beta blocker and ACE inhibitor. No antiplatelet therapy because of chronic oral anticoagulation.  3. Hypertension: Blood pressure is controlled on current medications which are reviewed today.  4. Hyperlipidemia: The patient is intolerant to lipid-lowering agents.  Current medicines are reviewed with the patient today.  The patient does not have concerns regarding medicines.  Labs/ tests ordered today include:   Orders Placed This Encounter  Procedures  . EKG 12-Lead    Disposition:   FU 6 months  Signed, Sherren Mocha, MD  12/02/2016 4:53 PM    [facility] [place], [place], [place]  [address] Phone: [phone]; Fax: [phone]

## 2016-12-10 DIAGNOSIS — M25522 Pain in left elbow: Secondary | ICD-10-CM | POA: Diagnosis not present

## 2016-12-15 DIAGNOSIS — Z23 Encounter for immunization: Secondary | ICD-10-CM | POA: Diagnosis not present

## 2016-12-17 ENCOUNTER — Encounter: Payer: Self-pay | Admitting: Family Medicine

## 2016-12-23 ENCOUNTER — Ambulatory Visit (INDEPENDENT_AMBULATORY_CARE_PROVIDER_SITE_OTHER): Payer: Medicare Other

## 2016-12-23 DIAGNOSIS — I481 Persistent atrial fibrillation: Secondary | ICD-10-CM

## 2016-12-23 DIAGNOSIS — I4891 Unspecified atrial fibrillation: Secondary | ICD-10-CM | POA: Diagnosis not present

## 2016-12-23 DIAGNOSIS — Z5181 Encounter for therapeutic drug level monitoring: Secondary | ICD-10-CM

## 2016-12-23 DIAGNOSIS — I4819 Other persistent atrial fibrillation: Secondary | ICD-10-CM

## 2016-12-23 LAB — POCT INR: INR: 3.9

## 2017-01-01 ENCOUNTER — Other Ambulatory Visit: Payer: Self-pay | Admitting: Cardiovascular Disease

## 2017-01-20 ENCOUNTER — Ambulatory Visit (INDEPENDENT_AMBULATORY_CARE_PROVIDER_SITE_OTHER): Payer: Medicare Other | Admitting: *Deleted

## 2017-01-20 DIAGNOSIS — Z5181 Encounter for therapeutic drug level monitoring: Secondary | ICD-10-CM

## 2017-01-20 DIAGNOSIS — I481 Persistent atrial fibrillation: Secondary | ICD-10-CM

## 2017-01-20 DIAGNOSIS — I4819 Other persistent atrial fibrillation: Secondary | ICD-10-CM

## 2017-01-20 LAB — POCT INR: INR: 2.4

## 2017-02-10 ENCOUNTER — Ambulatory Visit (INDEPENDENT_AMBULATORY_CARE_PROVIDER_SITE_OTHER): Payer: Medicare Other | Admitting: *Deleted

## 2017-02-10 DIAGNOSIS — I4819 Other persistent atrial fibrillation: Secondary | ICD-10-CM

## 2017-02-10 DIAGNOSIS — Z5181 Encounter for therapeutic drug level monitoring: Secondary | ICD-10-CM

## 2017-02-10 DIAGNOSIS — I481 Persistent atrial fibrillation: Secondary | ICD-10-CM | POA: Diagnosis not present

## 2017-02-10 LAB — POCT INR: INR: 2.3

## 2017-02-10 NOTE — Patient Instructions (Signed)
Continue taking 1 tablet daily except 1 and 1/2 tablets on Sundays and Thursdays. Recheck in 4 weeks.

## 2017-03-10 ENCOUNTER — Ambulatory Visit (INDEPENDENT_AMBULATORY_CARE_PROVIDER_SITE_OTHER): Payer: Medicare Other | Admitting: *Deleted

## 2017-03-10 DIAGNOSIS — Z5181 Encounter for therapeutic drug level monitoring: Secondary | ICD-10-CM

## 2017-03-10 DIAGNOSIS — I481 Persistent atrial fibrillation: Secondary | ICD-10-CM | POA: Diagnosis not present

## 2017-03-10 DIAGNOSIS — I4819 Other persistent atrial fibrillation: Secondary | ICD-10-CM

## 2017-03-10 LAB — POCT INR: INR: 2.2

## 2017-03-10 NOTE — Patient Instructions (Signed)
Description   Continue taking 1 tablet daily except 1 and 1/2 tablets on Sundays and Thursdays. Recheck in 4 weeks.

## 2017-04-07 ENCOUNTER — Ambulatory Visit (INDEPENDENT_AMBULATORY_CARE_PROVIDER_SITE_OTHER): Payer: Medicare Other | Admitting: *Deleted

## 2017-04-07 DIAGNOSIS — I482 Chronic atrial fibrillation, unspecified: Secondary | ICD-10-CM

## 2017-04-07 DIAGNOSIS — Z5181 Encounter for therapeutic drug level monitoring: Secondary | ICD-10-CM

## 2017-04-07 DIAGNOSIS — I481 Persistent atrial fibrillation: Secondary | ICD-10-CM | POA: Diagnosis not present

## 2017-04-07 DIAGNOSIS — I4819 Other persistent atrial fibrillation: Secondary | ICD-10-CM

## 2017-04-07 LAB — POCT INR: INR: 2.7

## 2017-04-07 NOTE — Patient Instructions (Signed)
Description   Continue taking 1 tablet daily except 1 and 1/2 tablets on Sundays and Thursdays. Recheck in 6 weeks.

## 2017-04-27 ENCOUNTER — Other Ambulatory Visit: Payer: Self-pay

## 2017-04-27 DIAGNOSIS — C4441 Basal cell carcinoma of skin of scalp and neck: Secondary | ICD-10-CM | POA: Diagnosis not present

## 2017-04-27 DIAGNOSIS — L578 Other skin changes due to chronic exposure to nonionizing radiation: Secondary | ICD-10-CM | POA: Diagnosis not present

## 2017-04-30 ENCOUNTER — Other Ambulatory Visit: Payer: Self-pay | Admitting: Cardiovascular Disease

## 2017-05-14 ENCOUNTER — Other Ambulatory Visit: Payer: Self-pay | Admitting: Cardiovascular Disease

## 2017-05-14 DIAGNOSIS — R42 Dizziness and giddiness: Secondary | ICD-10-CM

## 2017-05-14 DIAGNOSIS — I4819 Other persistent atrial fibrillation: Secondary | ICD-10-CM

## 2017-05-14 DIAGNOSIS — I2510 Atherosclerotic heart disease of native coronary artery without angina pectoris: Secondary | ICD-10-CM

## 2017-05-19 ENCOUNTER — Ambulatory Visit (INDEPENDENT_AMBULATORY_CARE_PROVIDER_SITE_OTHER): Payer: Medicare Other | Admitting: *Deleted

## 2017-05-19 DIAGNOSIS — I482 Chronic atrial fibrillation, unspecified: Secondary | ICD-10-CM

## 2017-05-19 DIAGNOSIS — I481 Persistent atrial fibrillation: Secondary | ICD-10-CM

## 2017-05-19 DIAGNOSIS — Z5181 Encounter for therapeutic drug level monitoring: Secondary | ICD-10-CM | POA: Diagnosis not present

## 2017-05-19 DIAGNOSIS — I4819 Other persistent atrial fibrillation: Secondary | ICD-10-CM

## 2017-05-19 LAB — POCT INR: INR: 2.4

## 2017-05-19 NOTE — Patient Instructions (Signed)
Description   Continue taking 1 tablet daily except 1 and 1/2 tablets on Sundays and Thursdays. Recheck in 6 weeks.

## 2017-06-03 ENCOUNTER — Ambulatory Visit (INDEPENDENT_AMBULATORY_CARE_PROVIDER_SITE_OTHER): Payer: Medicare Other | Admitting: Cardiovascular Disease

## 2017-06-03 ENCOUNTER — Encounter: Payer: Self-pay | Admitting: Cardiovascular Disease

## 2017-06-03 VITALS — BP 126/66 | HR 72 | Ht 66.0 in | Wt 218.0 lb

## 2017-06-03 DIAGNOSIS — I2510 Atherosclerotic heart disease of native coronary artery without angina pectoris: Secondary | ICD-10-CM

## 2017-06-03 DIAGNOSIS — I482 Chronic atrial fibrillation, unspecified: Secondary | ICD-10-CM

## 2017-06-03 DIAGNOSIS — I10 Essential (primary) hypertension: Secondary | ICD-10-CM

## 2017-06-03 DIAGNOSIS — I481 Persistent atrial fibrillation: Secondary | ICD-10-CM | POA: Diagnosis not present

## 2017-06-03 DIAGNOSIS — I4819 Other persistent atrial fibrillation: Secondary | ICD-10-CM

## 2017-06-03 NOTE — Patient Instructions (Signed)

## 2017-06-03 NOTE — Progress Notes (Signed)
Cardiology Office Note Date:  06/03/2017   ID:  [name], DOB 05/12/1930, MRN [id number]  PCP:  Tonia Ghent, MD  Cardiologist:  Sherren Mocha, MD    Chief Complaint  Patient presents with  . Follow-up  . Atrial Fibrillation     History of Present Illness: [name] is a 84 y.o. male who presents for follow-up of coronary artery disease and persistent atrial fibrillation.  The patient presented in 2017 with acute coronary syndrome and was found to have occlusion of the small obtuse marginal branch treated with balloon angioplasty. His LV function has been preserved with an LVEF of 55-60%. The patient is chronically anticoagulated with warfarin for atrial fibrillation.  The patient is here alone today.  He has been doing well from a cardiac perspective.  Denies chest pain, shortness of breath, or heart palpitations.  He has mild leg swelling at times but no orthopnea or PND.  He continues to work at age 44 without any significant limitation.   Past Medical History:  Diagnosis Date  . Bradycardia    a. nocturnal with pauses overnight on tele likely due to OSA 10/2015.  [name] CKD (chronic kidney disease), stage III ([place])   . Coronary artery disease    a. prev nonobst. b. LHC 10/24/15: occlusion of small diffusely diseased OM2 s/p balloon angioplasty, mild nonobstructive disease of mLAD and mRCA, normal LVEDP  . Diverticulosis   . Esophageal reflux   . Esophageal stricture from GERD 06/08/2011   With stricture at GE junction on EGD, dilated 06/2011   . Gastric ulcer    on EGD 2013  . Gout, unspecified   . Hyperlipidemia   . Hypertension   . Obesity, unspecified   . Orthopnea   . OSA on CPAP   . Paroxysmal nocturnal dyspnea   . Persistent atrial fibrillation ([place])   . Vertigo     Past Surgical History:  Procedure Laterality Date  . BALLOON DILATION N/A 10/22/2014   Procedure: BALLOON DILATION;  Surgeon: [name], MD;  Location: [facility];  Service:  Endoscopy;  Laterality: N/A;  . CARDIAC CATHETERIZATION  07/2008  . CARDIAC CATHETERIZATION N/A 10/24/2015   Procedure: Left Heart Cath and Coronary Angiography;  Surgeon: [name], MD;  Location: [facility];  Service: Cardiovascular;  Laterality: N/A;  . CARDIAC CATHETERIZATION N/A 10/24/2015   Procedure: Coronary Balloon Angioplasty;  Surgeon: [name], MD;  Location: [facility];  Service: Cardiovascular;  Laterality: N/A;  . Carotid Dopplers  09/2009   no sig extracranial stenosis and vertebral arteries had antegrade flow  . CATARACT EXTRACTION W/ INTRAOCULAR LENS  IMPLANT, BILATERAL Bilateral   . ESOPHAGOGASTRODUODENOSCOPY (EGD) WITH ESOPHAGEAL DILATION     "I've had it stretched 3 times" (10/23/2015)  . ESOPHAGOGASTRODUODENOSCOPY (EGD) WITH PROPOFOL N/A 10/22/2014   Procedure: ESOPHAGOGASTRODUODENOSCOPY (EGD) WITH PROPOFOL;  Surgeon: [name], MD;  [facility]: [facility];  Service: Endoscopy;  Laterality: N/A;  . MRI of brain  09/2009   chronic microvascular ischemia  . OSA Sleep Study     per Dr. Brett Fairy  . SHOULDER ARTHROSCOPY W/ ROTATOR CUFF REPAIR Right 2008    Current Outpatient Medications  Medication Sig Dispense Refill  . colchicine (COLCRYS) 0.6 MG tablet Take 1 tablet (0.6 mg total) by mouth 2 (two) times daily as needed (for gout). Okay to fill with colcrys 60 tablet 3  . diltiazem (CARDIZEM CD) 180 MG 24 hr capsule TAKE 1 CAPSULE (180 MG  TOTAL) BY MOUTH DAILY. 30 capsule 7  . fluticasone (FLONASE) 50 MCG/ACT nasal spray Place 1-2 sprays into both nostrils daily as needed for allergies.     . furosemide (LASIX) 20 MG tablet TAKE 1 TABLET (20 MG TOTAL) BY MOUTH DAILY. 30 tablet 7  . lisinopril (PRINIVIL,ZESTRIL) 20 MG tablet TAKE 1 TABLET (20 MG TOTAL) BY MOUTH DAILY. 90 tablet 3  . metoprolol tartrate (LOPRESSOR) 25 MG tablet TAKE 1/2 TABLET BY MOUTH TWICE A DAY 30 tablet 7  . nitroGLYCERIN (NITROSTAT) 0.4 MG SL tablet Place 1 tablet (0.4 mg  total) under the tongue every 5 (five) minutes x 3 doses as needed for chest pain. 25 tablet 2  . omeprazole (PRILOSEC) 40 MG capsule TAKE ONE CAPSULE BY MOUTH EVERY DAY 30 capsule 5  . warfarin (COUMADIN) 4 MG tablet TAKE AS DIRECTED BY COUMADIN CLINIC 40 tablet 3   No current facility-administered medications for this visit.     Allergies:   Penicillins; Pravastatin; Atorvastatin; Biaxin [clarithromycin]; and Doxycycline   Social History:  The patient  reports that he quit smoking about 48 years ago. His smoking use included cigarettes. He has a 0.48 pack-year smoking history. He has never used smokeless tobacco. He reports that he does not drink alcohol or use drugs.   Family History:  The patient's family history includes Dementia in his father; Heart disease in his brother; Hypertension in his father and other; Pneumonia in his father; Stomach cancer in his mother.    ROS:  Please see the history of present illness.  Foot pain/burning, finger numbness. All other systems are reviewed and negative.    PHYSICAL EXAM: VS:  BP 126/66   Pulse 72   Ht 5\' 6"  (1.676 m)   Wt 218 lb (98.9 kg)   SpO2 97%   BMI 35.19 kg/m  , BMI Body mass index is 35.19 kg/m. GEN: Pleasant obese male, in no acute distress  HEENT: normal  Neck: no JVD, no masses. No carotid bruits Cardiac: irregularly irregular without murmur or gallop                Respiratory:  clear to auscultation bilaterally, normal work of breathing GI: soft, nontender, obese MS: no deformity or atrophy  Ext: no pretibial edema, pedal pulses 2+= bilaterally, trace pretibial edema Skin: warm and dry, no rash Neuro:  Strength and sensation are intact Psych: euthymic mood, full affect  EKG:  EKG is ordered today. The ekg ordered today shows atrial fibrillation 72 bpm, otherwise within normal limits  Recent Labs: No results found for requested labs within last 8760 hours.   Lipid Panel     Component Value Date/Time   CHOL 185  02/24/2016 0915   TRIG 282.0 (H) 02/24/2016 0915   HDL 26.30 (L) 02/24/2016 0915   CHOLHDL 7 02/24/2016 0915   VLDL 56.4 (H) 02/24/2016 0915   LDLCALC 82 10/24/2015 0430   LDLDIRECT 106.0 02/24/2016 0915      Wt Readings from Last 3 Encounters:  06/03/17 218 lb (98.9 kg)  12/02/16 201 lb 6.4 oz (91.4 kg)  11/23/16 207 lb 12 oz (94.2 kg)     ASSESSMENT AND PLAN: 1.  Permanent atrial fibrillation: The patient is tolerating anticoagulation with warfarin.  He has had no interval stroke or TIA symptoms.  His heart rate is well controlled on diltiazem.  No changes are made today.  2.  Coronary artery disease, native vessel, without angina: Remain stable.  No antiplatelet therapy is prescribed  in the setting of chronic oral anticoagulation  3.  Hypertension: Blood pressure is well controlled.  He brings in home readings also demonstrating consistent blood pressure control with less than 10% of his readings greater than 140 mmHg.  4.  Hyperlipidemia: He has had hyperlipidemia with hypertriglyceridemia.  The patient is statin intolerant.  I offered to do blood work but he prefers to have it all done at the time of his annual physical with Dr. Damita Dunnings. Lifestyle modification reviewed.   Current medicines are reviewed with the patient today.  The patient does not have concerns regarding medicines.  Labs/ tests ordered today include:   Orders Placed This Encounter  Procedures  . EKG 12-Lead   Disposition:   FU 6 months  Signed, Sherren Mocha, MD  06/03/2017 10:40 AM    [facility] [place], [place], [place]  [address] Phone: [phone]; Fax: [phone]

## 2017-06-27 ENCOUNTER — Other Ambulatory Visit: Payer: Self-pay | Admitting: Cardiovascular Disease

## 2017-07-01 ENCOUNTER — Ambulatory Visit (INDEPENDENT_AMBULATORY_CARE_PROVIDER_SITE_OTHER): Payer: Medicare Other | Admitting: *Deleted

## 2017-07-01 DIAGNOSIS — I482 Chronic atrial fibrillation, unspecified: Secondary | ICD-10-CM

## 2017-07-01 DIAGNOSIS — I4819 Other persistent atrial fibrillation: Secondary | ICD-10-CM

## 2017-07-01 DIAGNOSIS — I481 Persistent atrial fibrillation: Secondary | ICD-10-CM

## 2017-07-01 DIAGNOSIS — Z5181 Encounter for therapeutic drug level monitoring: Secondary | ICD-10-CM | POA: Diagnosis not present

## 2017-07-01 LAB — POCT INR: INR: 2.3

## 2017-07-01 NOTE — Patient Instructions (Signed)
Description   Continue taking 1 tablet daily except 1 and 1/2 tablets on Sundays and Thursdays. Recheck in 6 weeks.

## 2017-07-08 ENCOUNTER — Encounter: Payer: Self-pay | Admitting: Internal Medicine

## 2017-07-12 ENCOUNTER — Telehealth: Payer: Self-pay | Admitting: Family Medicine

## 2017-07-12 NOTE — Telephone Encounter (Signed)
I spoke with Tabatha (DPR signed) pt stopped omeprazole because legs were hurting and feet feels like has pins sticking in them; Tabatha read that these were side effects of omeprazole.request older med; does not want to try any new meds and request that Dr Damita Dunnings make sure can take with his other meds from the heart doctor. [facility].

## 2017-07-12 NOTE — Telephone Encounter (Signed)
Copied from [facility] [id number]. Topic: Quick Communication - See Telephone Encounter >> Jul 12, 2017  4:01 PM Boyd Kerbs wrote: CRM for notification.   Pt. Stopped his omeprazole (PRILOSEC) 40 MG capsule  started having side effect.  Stopped 3 days ago.   He is wanting a different kind  See Telephone encounter for: 07/12/17.

## 2017-07-13 MED ORDER — RANITIDINE HCL 150 MG PO TABS: 150.0000 mg | ORAL_TABLET | Freq: Two times a day (BID) | ORAL | 3 refills | Status: DC | PRN

## 2017-07-13 NOTE — Telephone Encounter (Signed)
Daughter advised.

## 2017-07-13 NOTE — Telephone Encounter (Signed)
Would try zantac.  Can try it just at night or can take BID if needed.  rx sent.  Thanks.  Update me as needed.

## 2017-07-13 NOTE — Telephone Encounter (Signed)
Did he get better with stopping the omeprazole?  If so, then let me know.  If not, then needs OV.  Thanks.

## 2017-07-13 NOTE — Telephone Encounter (Signed)
Yes, patient's symptoms completely subsided and he's doing great now but does need something for his reflux that seems to be bad at night.

## 2017-07-23 ENCOUNTER — Ambulatory Visit: Payer: Medicare Other | Admitting: Family Medicine

## 2017-08-12 ENCOUNTER — Ambulatory Visit (INDEPENDENT_AMBULATORY_CARE_PROVIDER_SITE_OTHER): Payer: Medicare Other | Admitting: *Deleted

## 2017-08-12 DIAGNOSIS — I481 Persistent atrial fibrillation: Secondary | ICD-10-CM | POA: Diagnosis not present

## 2017-08-12 DIAGNOSIS — Z5181 Encounter for therapeutic drug level monitoring: Secondary | ICD-10-CM | POA: Diagnosis not present

## 2017-08-12 DIAGNOSIS — I4819 Other persistent atrial fibrillation: Secondary | ICD-10-CM

## 2017-08-12 LAB — POCT INR: INR: 2.2 (ref 2.0–3.0)

## 2017-08-12 NOTE — Patient Instructions (Signed)
Description   Continue taking 1 tablet daily except 1 and 1/2 tablets on Sundays and Thursdays. Recheck in 6 weeks.

## 2017-08-25 ENCOUNTER — Other Ambulatory Visit: Payer: Self-pay | Admitting: Cardiovascular Disease

## 2017-08-26 ENCOUNTER — Other Ambulatory Visit: Payer: Self-pay | Admitting: Internal Medicine

## 2017-08-26 DIAGNOSIS — R1319 Other dysphagia: Secondary | ICD-10-CM

## 2017-08-26 DIAGNOSIS — K222 Esophageal obstruction: Secondary | ICD-10-CM

## 2017-08-26 DIAGNOSIS — R1310 Dysphagia, unspecified: Secondary | ICD-10-CM

## 2017-09-05 DIAGNOSIS — L03115 Cellulitis of right lower limb: Secondary | ICD-10-CM | POA: Diagnosis not present

## 2017-09-06 DIAGNOSIS — M25571 Pain in right ankle and joints of right foot: Secondary | ICD-10-CM | POA: Diagnosis not present

## 2017-09-09 ENCOUNTER — Encounter (INDEPENDENT_AMBULATORY_CARE_PROVIDER_SITE_OTHER): Payer: Self-pay | Admitting: Orthopedic Surgery

## 2017-09-09 ENCOUNTER — Other Ambulatory Visit: Payer: Self-pay

## 2017-09-09 ENCOUNTER — Other Ambulatory Visit (INDEPENDENT_AMBULATORY_CARE_PROVIDER_SITE_OTHER): Payer: Self-pay | Admitting: Orthopedic Surgery

## 2017-09-09 ENCOUNTER — Encounter (HOSPITAL_COMMUNITY): Payer: Self-pay | Admitting: General Practice

## 2017-09-09 ENCOUNTER — Ambulatory Visit (INDEPENDENT_AMBULATORY_CARE_PROVIDER_SITE_OTHER): Payer: Medicare Other | Admitting: Orthopedic Surgery

## 2017-09-09 ENCOUNTER — Inpatient Hospital Stay (HOSPITAL_COMMUNITY)
Admission: AD | Admit: 2017-09-09 | Discharge: 2017-09-13 | DRG: 504 | Disposition: A | Discharge status: Home Health | Payer: Medicare Other | Source: Ambulatory Visit | Attending: Internal Medicine | Admitting: Internal Medicine

## 2017-09-09 VITALS — Ht 66.0 in | Wt 218.0 lb

## 2017-09-09 DIAGNOSIS — Z9989 Dependence on other enabling machines and devices: Secondary | ICD-10-CM

## 2017-09-09 DIAGNOSIS — L03031 Cellulitis of right toe: Secondary | ICD-10-CM | POA: Diagnosis present

## 2017-09-09 DIAGNOSIS — I482 Chronic atrial fibrillation: Secondary | ICD-10-CM | POA: Diagnosis present

## 2017-09-09 DIAGNOSIS — Z880 Allergy status to penicillin: Secondary | ICD-10-CM | POA: Diagnosis not present

## 2017-09-09 DIAGNOSIS — I10 Essential (primary) hypertension: Secondary | ICD-10-CM | POA: Diagnosis present

## 2017-09-09 DIAGNOSIS — M2550 Pain in unspecified joint: Secondary | ICD-10-CM | POA: Diagnosis not present

## 2017-09-09 DIAGNOSIS — E669 Obesity, unspecified: Secondary | ICD-10-CM | POA: Diagnosis present

## 2017-09-09 DIAGNOSIS — N183 Chronic kidney disease, stage 3 (moderate): Secondary | ICD-10-CM | POA: Diagnosis present

## 2017-09-09 DIAGNOSIS — E785 Hyperlipidemia, unspecified: Secondary | ICD-10-CM | POA: Diagnosis present

## 2017-09-09 DIAGNOSIS — Z9861 Coronary angioplasty status: Secondary | ICD-10-CM | POA: Diagnosis not present

## 2017-09-09 DIAGNOSIS — Z6835 Body mass index (BMI) 35.0-35.9, adult: Secondary | ICD-10-CM

## 2017-09-09 DIAGNOSIS — Z7901 Long term (current) use of anticoagulants: Secondary | ICD-10-CM

## 2017-09-09 DIAGNOSIS — Z881 Allergy status to other antibiotic agents status: Secondary | ICD-10-CM

## 2017-09-09 DIAGNOSIS — Z888 Allergy status to other drugs, medicaments and biological substances status: Secondary | ICD-10-CM

## 2017-09-09 DIAGNOSIS — M1A9XX1 Chronic gout, unspecified, with tophus (tophi): Secondary | ICD-10-CM | POA: Diagnosis present

## 2017-09-09 DIAGNOSIS — I2510 Atherosclerotic heart disease of native coronary artery without angina pectoris: Secondary | ICD-10-CM | POA: Diagnosis not present

## 2017-09-09 DIAGNOSIS — Z87891 Personal history of nicotine dependence: Secondary | ICD-10-CM

## 2017-09-09 DIAGNOSIS — I130 Hypertensive heart and chronic kidney disease with heart failure and stage 1 through stage 4 chronic kidney disease, or unspecified chronic kidney disease: Secondary | ICD-10-CM | POA: Diagnosis present

## 2017-09-09 DIAGNOSIS — I5032 Chronic diastolic (congestive) heart failure: Secondary | ICD-10-CM | POA: Diagnosis present

## 2017-09-09 DIAGNOSIS — M009 Pyogenic arthritis, unspecified: Secondary | ICD-10-CM | POA: Diagnosis not present

## 2017-09-09 DIAGNOSIS — K219 Gastro-esophageal reflux disease without esophagitis: Secondary | ICD-10-CM | POA: Diagnosis present

## 2017-09-09 DIAGNOSIS — K5790 Diverticulosis of intestine, part unspecified, without perforation or abscess without bleeding: Secondary | ICD-10-CM | POA: Diagnosis present

## 2017-09-09 DIAGNOSIS — G4733 Obstructive sleep apnea (adult) (pediatric): Secondary | ICD-10-CM | POA: Diagnosis present

## 2017-09-09 DIAGNOSIS — Z7401 Bed confinement status: Secondary | ICD-10-CM | POA: Diagnosis not present

## 2017-09-09 DIAGNOSIS — I481 Persistent atrial fibrillation: Secondary | ICD-10-CM | POA: Diagnosis present

## 2017-09-09 DIAGNOSIS — L02611 Cutaneous abscess of right foot: Secondary | ICD-10-CM

## 2017-09-09 DIAGNOSIS — E7849 Other hyperlipidemia: Secondary | ICD-10-CM | POA: Diagnosis not present

## 2017-09-09 DIAGNOSIS — Z8249 Family history of ischemic heart disease and other diseases of the circulatory system: Secondary | ICD-10-CM

## 2017-09-09 DIAGNOSIS — Z8711 Personal history of peptic ulcer disease: Secondary | ICD-10-CM | POA: Diagnosis not present

## 2017-09-09 DIAGNOSIS — Z79899 Other long term (current) drug therapy: Secondary | ICD-10-CM

## 2017-09-09 DIAGNOSIS — I5031 Acute diastolic (congestive) heart failure: Secondary | ICD-10-CM | POA: Diagnosis not present

## 2017-09-09 DIAGNOSIS — M109 Gout, unspecified: Secondary | ICD-10-CM | POA: Diagnosis present

## 2017-09-09 DIAGNOSIS — L03115 Cellulitis of right lower limb: Secondary | ICD-10-CM | POA: Diagnosis not present

## 2017-09-09 DIAGNOSIS — M1A0710 Idiopathic chronic gout, right ankle and foot, without tophus (tophi): Secondary | ICD-10-CM

## 2017-09-09 DIAGNOSIS — Z9841 Cataract extraction status, right eye: Secondary | ICD-10-CM | POA: Diagnosis not present

## 2017-09-09 DIAGNOSIS — I4819 Other persistent atrial fibrillation: Secondary | ICD-10-CM | POA: Diagnosis present

## 2017-09-09 DIAGNOSIS — Z800 Family history of malignant neoplasm of digestive organs: Secondary | ICD-10-CM

## 2017-09-09 DIAGNOSIS — Z9842 Cataract extraction status, left eye: Secondary | ICD-10-CM | POA: Diagnosis not present

## 2017-09-09 DIAGNOSIS — I110 Hypertensive heart disease with heart failure: Secondary | ICD-10-CM | POA: Diagnosis not present

## 2017-09-09 HISTORY — DX: Personal history of other medical treatment: Z92.89

## 2017-09-09 LAB — COMPREHENSIVE METABOLIC PANEL
AST: 26 U/L (ref 15–41)
Albumin: 4.3 g/dL (ref 3.5–5.0)
Anion gap: 13 (ref 5–15)
BUN: 20 mg/dL (ref 8–23)
Chloride: 101 mmol/L (ref 98–111)
Glucose, Bld: 82 mg/dL (ref 70–99)
Potassium: 4.4 mmol/L (ref 3.5–5.1)
Sodium: 139 mmol/L (ref 135–145)

## 2017-09-09 LAB — CBC WITH DIFFERENTIAL/PLATELET
Abs Immature Granulocytes: 0 10*3/uL (ref 0.0–0.1)
Basophils Absolute: 0 10*3/uL (ref 0.0–0.1)
Basophils Relative: 0 %
Eosinophils Absolute: 0.2 K/uL (ref 0.0–0.7)
Eosinophils Relative: 2 %
HCT: 49.8 % (ref 39.0–52.0)
Hemoglobin: 16.3 g/dL (ref 13.0–17.0)
Immature Granulocytes: 1 %
Lymphocytes Relative: 21 %
Lymphs Abs: 1.6 10*3/uL (ref 0.7–4.0)
MCH: 28.8 pg (ref 26.0–34.0)
MCHC: 32.7 g/dL (ref 30.0–36.0)
MCV: 88 fL (ref 78.0–100.0)
Monocytes Absolute: 0.7 10*3/uL (ref 0.1–1.0)
Monocytes Relative: 10 %
Neutro Abs: 5.1 10*3/uL (ref 1.7–7.7)
Neutrophils Relative %: 66 %
Platelets: 255 10*3/uL (ref 150–400)
RBC: 5.66 MIL/uL (ref 4.22–5.81)
RDW: 13.8 % (ref 11.5–15.5)
WBC: 7.6 K/uL (ref 4.0–10.5)

## 2017-09-09 LAB — BASIC METABOLIC PANEL
Anion gap: 8 (ref 5–15)
Calcium: 9.4 mg/dL (ref 8.9–10.3)
Chloride: 103 mmol/L (ref 98–111)
GFR calc Af Amer: 52 mL/min — ABNORMAL LOW (ref >60)
GFR calc non Af Amer: 45 mL/min — ABNORMAL LOW (ref >60)
Potassium: 4.4 mmol/L (ref 3.5–5.1)

## 2017-09-09 LAB — SURGICAL PCR SCREEN
MRSA, PCR: NEGATIVE
Staphylococcus aureus: NEGATIVE

## 2017-09-09 LAB — BASIC METABOLIC PANEL WITH GFR
BUN: 18 mg/dL (ref 8–23)
CO2: 27 mmol/L (ref 22–32)
Creatinine, Ser: 1.39 mg/dL — ABNORMAL HIGH (ref 0.61–1.24)
Glucose, Bld: 81 mg/dL (ref 70–99)
Sodium: 138 mmol/L (ref 135–145)

## 2017-09-09 LAB — COMPREHENSIVE METABOLIC PANEL WITH GFR
ALT: 29 U/L (ref 0–44)
Alkaline Phosphatase: 83 U/L (ref 38–126)
CO2: 25 mmol/L (ref 22–32)
Calcium: 9.5 mg/dL (ref 8.9–10.3)
Creatinine, Ser: 1.3 mg/dL — ABNORMAL HIGH (ref 0.61–1.24)
GFR calc Af Amer: 56 mL/min — ABNORMAL LOW (ref >60)
GFR calc non Af Amer: 49 mL/min — ABNORMAL LOW (ref >60)
Total Bilirubin: 0.7 mg/dL (ref 0.3–1.2)
Total Protein: 8.2 g/dL — ABNORMAL HIGH (ref 6.5–8.1)

## 2017-09-09 LAB — PROTIME-INR
INR: 1.42
Prothrombin Time: 17.2 seconds — ABNORMAL HIGH (ref 11.4–15.2)

## 2017-09-09 LAB — PROCALCITONIN: Procalcitonin: <0.1 ng/mL

## 2017-09-09 LAB — C-REACTIVE PROTEIN: CRP: 1.1 mg/dL — ABNORMAL HIGH (ref <1.0)

## 2017-09-09 LAB — URIC ACID: Uric Acid, Serum: 9 mg/dL — ABNORMAL HIGH (ref 3.7–8.6)

## 2017-09-09 LAB — MAGNESIUM: Magnesium: 2.2 mg/dL (ref 1.7–2.4)

## 2017-09-09 LAB — SEDIMENTATION RATE: Sed Rate: 17 mm/hr — ABNORMAL HIGH (ref 0–16)

## 2017-09-09 MED ORDER — WARFARIN SODIUM 4 MG PO TABS
4.0000 mg | ORAL_TABLET | Freq: Every day | ORAL | Status: DC
  Filled 2017-09-09: qty 1

## 2017-09-09 MED ORDER — NITROGLYCERIN 0.4 MG SL SUBL: 0.4000 mg | SUBLINGUAL_TABLET | SUBLINGUAL | Status: DC | PRN

## 2017-09-09 MED ORDER — VANCOMYCIN HCL 10 G IV SOLR
1250.0000 mg | INTRAVENOUS | Status: DC
  Administered 2017-09-10 – 2017-09-12 (×3): 1250 mg via INTRAVENOUS
  Filled 2017-09-09 (×3): qty 1250

## 2017-09-09 MED ORDER — CLINDAMYCIN PHOSPHATE 900 MG/50ML IV SOLN
900.0000 mg | INTRAVENOUS | Status: AC
  Administered 2017-09-10: 900 mg via INTRAVENOUS
  Filled 2017-09-09 (×2): qty 50

## 2017-09-09 MED ORDER — WARFARIN SODIUM 6 MG PO TABS
6.0000 mg | ORAL_TABLET | Freq: Once | ORAL | Status: AC
  Administered 2017-09-09: 6 mg via ORAL
  Filled 2017-09-09: qty 1

## 2017-09-09 MED ORDER — VANCOMYCIN HCL 10 G IV SOLR
2000.0000 mg | Freq: Once | INTRAVENOUS | Status: AC
  Administered 2017-09-09: 2000 mg via INTRAVENOUS
  Filled 2017-09-09: qty 2000

## 2017-09-09 MED ORDER — SODIUM CHLORIDE 0.9 % IV SOLN
250.0000 mL | INTRAVENOUS | Status: DC | PRN
  Administered 2017-09-09: 250 mL via INTRAVENOUS

## 2017-09-09 MED ORDER — SODIUM CHLORIDE 0.9% FLUSH
3.0000 mL | Freq: Two times a day (BID) | INTRAVENOUS | Status: DC
  Administered 2017-09-09 – 2017-09-13 (×7): 3 mL via INTRAVENOUS

## 2017-09-09 MED ORDER — SODIUM CHLORIDE 0.9 % IV SOLN
1.0000 g | INTRAVENOUS | Status: DC
  Administered 2017-09-09 – 2017-09-13 (×5): 1 g via INTRAVENOUS
  Filled 2017-09-09 (×5): qty 10

## 2017-09-09 MED ORDER — WARFARIN - PHYSICIAN DOSING INPATIENT: Freq: Every day | Status: DC

## 2017-09-09 MED ORDER — FLUTICASONE PROPIONATE 50 MCG/ACT NA SUSP
1.0000 | Freq: Every day | NASAL | Status: DC | PRN
  Filled 2017-09-09: qty 16

## 2017-09-09 MED ORDER — METOPROLOL TARTRATE 12.5 MG HALF TABLET
12.5000 mg | ORAL_TABLET | Freq: Two times a day (BID) | ORAL | Status: DC
  Administered 2017-09-09 – 2017-09-13 (×8): 12.5 mg via ORAL
  Filled 2017-09-09 (×8): qty 1

## 2017-09-09 MED ORDER — POLYETHYLENE GLYCOL 3350 17 G PO PACK: 17.0000 g | PACK | Freq: Every day | ORAL | Status: DC | PRN

## 2017-09-09 MED ORDER — SODIUM CHLORIDE 0.9% FLUSH: 3.0000 mL | INTRAVENOUS | Status: DC | PRN

## 2017-09-09 MED ORDER — LISINOPRIL 20 MG PO TABS
20.0000 mg | ORAL_TABLET | Freq: Every day | ORAL | Status: DC
  Administered 2017-09-11 – 2017-09-13 (×3): 20 mg via ORAL
  Filled 2017-09-09 (×4): qty 1

## 2017-09-09 MED ORDER — WARFARIN - PHARMACIST DOSING INPATIENT
Freq: Every day | Status: DC
  Administered 2017-09-09 – 2017-09-12 (×4)

## 2017-09-09 MED ORDER — DILTIAZEM HCL ER COATED BEADS 180 MG PO CP24
180.0000 mg | ORAL_CAPSULE | Freq: Every day | ORAL | Status: DC
  Administered 2017-09-09 – 2017-09-13 (×4): 180 mg via ORAL
  Filled 2017-09-09 (×6): qty 1

## 2017-09-09 MED ORDER — PANTOPRAZOLE SODIUM 40 MG PO TBEC
40.0000 mg | DELAYED_RELEASE_TABLET | Freq: Every day | ORAL | Status: DC
  Administered 2017-09-11 – 2017-09-13 (×3): 40 mg via ORAL
  Filled 2017-09-09 (×4): qty 1

## 2017-09-09 MED ORDER — CHLORHEXIDINE GLUCONATE 4 % EX LIQD: 60.0000 mL | Freq: Once | CUTANEOUS | Status: DC

## 2017-09-09 MED ORDER — ACETAMINOPHEN 325 MG PO TABS
650.0000 mg | ORAL_TABLET | Freq: Four times a day (QID) | ORAL | Status: DC | PRN
  Administered 2017-09-13: 650 mg via ORAL
  Filled 2017-09-09: qty 2

## 2017-09-09 MED ORDER — SODIUM CHLORIDE 0.9% FLUSH: 10.0000 mL | INTRAVENOUS | Status: DC | PRN

## 2017-09-09 MED ORDER — ACETAMINOPHEN 650 MG RE SUPP: 650.0000 mg | Freq: Four times a day (QID) | RECTAL | Status: DC | PRN

## 2017-09-09 MED ORDER — ONDANSETRON HCL 4 MG PO TABS: 4.0000 mg | ORAL_TABLET | Freq: Four times a day (QID) | ORAL | Status: DC | PRN

## 2017-09-09 MED ORDER — COLCHICINE 0.6 MG PO TABS: 0.6000 mg | ORAL_TABLET | Freq: Two times a day (BID) | ORAL | Status: DC | PRN

## 2017-09-09 MED ORDER — ONDANSETRON HCL 4 MG/2ML IJ SOLN: 4.0000 mg | Freq: Four times a day (QID) | INTRAMUSCULAR | Status: DC | PRN

## 2017-09-09 MED ORDER — TRAMADOL HCL 50 MG PO TABS
50.0000 mg | ORAL_TABLET | Freq: Four times a day (QID) | ORAL | Status: DC | PRN
  Administered 2017-09-11 – 2017-09-13 (×2): 50 mg via ORAL
  Filled 2017-09-09 (×2): qty 1

## 2017-09-09 MED ORDER — DIPHENHYDRAMINE HCL 25 MG PO CAPS
50.0000 mg | ORAL_CAPSULE | ORAL | Status: DC | PRN
  Administered 2017-09-09 – 2017-09-11 (×2): 50 mg via ORAL
  Filled 2017-09-09 (×3): qty 2

## 2017-09-09 MED ORDER — MUPIROCIN 2 % EX OINT
1.0000 "application " | TOPICAL_OINTMENT | Freq: Two times a day (BID) | CUTANEOUS | Status: AC
  Administered 2017-09-09 – 2017-09-10 (×2): 1 via NASAL
  Filled 2017-09-09: qty 22

## 2017-09-09 NOTE — Progress Notes (Signed)
Pharmacy Antibiotic Note  [name] is a 84 y.o. male admitted on 09/09/2017 with cellulitis.  Pharmacy has been consulted for vancomycin dosing.  Started on vancomycin for toe infection / cellulitis. Also seems to have gout flare. Hx of CKD. SCr near baseline at 1.3, CrCl ~40-60ml/min  Plan: Give vancomycin 2g IV x 1, then start vancomycin 1,250mg  IV Q24h Start Rocephin 1g IV Q24h Monitor clinical picture, renal function, VT prn F/U ortho recs, abx deescalation / LOT    No data recorded.  No results for input(s): WBC, CREATININE, LATICACIDVEN, VANCOTROUGH, VANCOPEAK, VANCORANDOM, GENTTROUGH, GENTPEAK, GENTRANDOM, TOBRATROUGH, TOBRAPEAK, TOBRARND, AMIKACINPEAK, AMIKACINTROU, AMIKACIN in the last 168 hours.  CrCl cannot be calculated (Patient's most recent lab result is older than the maximum 21 days allowed.).    Allergies  Allergen Reactions  . Penicillins Shortness Of Breath and Swelling    Has patient had a PCN reaction causing immediate rash, facial/tongue/throat swelling, SOB or lightheadedness with hypotensio: YES Has patient had a PCN reaction causing severe rash involving mucus membranes or skin necrosis: NO Has patient had a PCN reaction that required hospitalization NO Has patient had a PCN reaction occurring within the last 10 years: NO If all of the above answers are "NO", then may proceed with Cephalosporin use.  [name] Omeprazole Other (See Comments)    Causes legs to hurt and feels like pins sticking his feet.  . Pravastatin Other (See Comments)    myalgias  . Atorvastatin Other (See Comments)    No energy, just felt bad    . Biaxin [Clarithromycin] Diarrhea  . Doxycycline Other (See Comments)    GI upset    Thank you for allowing pharmacy to be a part of this patient's care.  Reginia Naas 09/09/2017 10:35 AM

## 2017-09-09 NOTE — Plan of Care (Signed)
?  Problem: Clinical Measurements: ?Goal: Will remain free from infection ?Outcome: Progressing ?  ?

## 2017-09-09 NOTE — Progress Notes (Signed)
Office Visit Note   Patient: [name]           Date of Birth: 13-Apr-1930           MRN: [id number] Visit Date: 09/09/2017              Requested by: Tonia Ghent, MD [address] [place], [place] [address] PCP: Tonia Ghent, MD  Chief Complaint  Patient presents with  . Right Foot - Pain      HPI: Patient is a 84 year old gentleman with history of gout his last uric acid was in 2017 at 10.2.  Patient states that he was in [place] ate some seafood and developed acute gout of the right great toe MTP joint.  He states that for over a week now he has taken colchicine 0.6 mg twice a day without any change.  He did go to to his orthopedic doctor was placed on Omnicef 300 mg twice a day by Dr. Noemi Chapel however patient still has cellulitis involving the entire foot.  Patient is undergone appropriate care with both treatment for gout and treatment for cellulitis without resolution.  Assessment & Plan: Visit Diagnoses:  1. Idiopathic chronic gout of right foot without tophus   2. Cellulitis of right foot     Plan: We will have patient admitted to the hospitalist service today.  Patient will need to start IV antibiotics.  Hospitalist to manage patient's complex medical history including atrial fibrillation currently on Coumadin.  If patient does not show improvement we will plan for irrigation debridement for surgical decompression of the infection on Friday afternoon.  Follow-Up Instructions: Return in about 2 weeks (around 09/23/2017).   Ortho Exam  Patient is alert, oriented, no adenopathy, well-dressed, normal affect, normal respiratory effort. Due to patient's swelling he does not have a palpable pulse with the Doppler patient has a strong biphasic dorsalis pedis pulse.  He does have atrial fibrillation.  Patient has cellulitis and swelling of the entire foot there are no ulcers no tophaceous deposits.  There is no drainage.  There is minimal range of motion of the  great toe and active and passive range of motion is painful.  Imaging: No results found. No images are attached to the encounter.  Labs: Lab Results  Component Value Date   HGBA1C  07/31/2008    6.1 (NOTE) The ADA recommends the following therapeutic goal for glycemic control related to Hgb A1c measurement: Goal of therapy: <6.5 Hgb A1c  Reference: American Diabetes Association: Clinical Practice Recommendations 2010, Diabetes Care, 2010, 33: (Suppl  1).   LABURIC 10.2 (H) 02/13/2016   LABURIC 9.5 (H) 12/06/2014   LABURIC 5.7 09/22/2013   REPTSTATUS 09/24/2009 FINAL 09/22/2009   CULT INSIGNIFICANT GROWTH 09/22/2009     Lab Results  Component Value Date   ALBUMIN 4.4 02/13/2016   ALBUMIN 3.8 11/08/2015   ALBUMIN 3.6 10/24/2015   LABURIC 10.2 (H) 02/13/2016   LABURIC 9.5 (H) 12/06/2014   LABURIC 5.7 09/22/2013    Body mass index is 35.19 kg/m.  Orders:  No orders of the defined types were placed in this encounter.  No orders of the defined types were placed in this encounter.    Procedures: No procedures performed  Clinical Data: No additional findings.  ROS:  All other systems negative, except as noted in the HPI. Review of Systems  Objective: Vital Signs: Ht 5\' 6"  (1.676 m)   Wt 218 lb (98.9 kg)   BMI 35.19  kg/m   Specialty Comments:  No specialty comments available.  PMFS History: Patient Active Problem List   Diagnosis Date Noted  . URI (upper respiratory infection) 11/24/2016  . Atypical angina ([place]) 11/08/2015  . Acute diastolic heart failure ([place])   . CAD S/P percutaneous coronary angioplasty   . Chronic atrial fibrillation ([place])   . Unstable angina ([place]) 10/25/2015  . CKD (chronic kidney disease), stage III ([place])   . Persistent atrial fibrillation ([place])   . Hypertension   . Hyperlipidemia   . Bradycardia   . Glucose intolerance (impaired glucose tolerance) 10/22/2015  . Back pain 05/24/2015  . Triceps tendonitis 05/24/2015  . ETD  (eustachian tube dysfunction) 05/24/2015  . Hyperglycemia 12/18/2014  . Dysphagia, pharyngoesophageal phase 09/25/2014  . History of esophageal stricture 09/25/2014  . Chronic anticoagulation 09/25/2014  . Helicobacter pylori duodenitis 09/25/2014  . Benign paroxysmal positional vertigo 03/14/2014  . Encounter for therapeutic drug monitoring 04/12/2013  . Esophageal stricture from GERD 06/08/2011  . Advance care planning 04/20/2011  . OSA on CPAP 09/22/2010  . HYPERCHOLESTEROLEMIA 08/15/2008  . Gout 08/15/2008  . OBESITY 08/15/2008  . Essential hypertension 08/15/2008  . CAD (coronary artery disease), native coronary artery 08/15/2008  . GASTROESOPHAGEAL REFLUX DISEASE 08/15/2008   Past Medical History:  Diagnosis Date  . Bradycardia    a. nocturnal with pauses overnight on tele likely due to OSA 10/2015.  [name] CKD (chronic kidney disease), stage III ([place])   . Coronary artery disease    a. prev nonobst. b. LHC 10/24/15: occlusion of small diffusely diseased OM2 s/p balloon angioplasty, mild nonobstructive disease of mLAD and mRCA, normal LVEDP  . Diverticulosis   . Esophageal reflux   . Esophageal stricture from GERD 06/08/2011   With stricture at GE junction on EGD, dilated 06/2011   . Gastric ulcer    on EGD 2013  . Gout, unspecified   . Hyperlipidemia   . Hypertension   . Obesity, unspecified   . Orthopnea   . OSA on CPAP   . Paroxysmal nocturnal dyspnea   . Persistent atrial fibrillation ([place])   . Vertigo     Family History  Problem Relation Age of Onset  . Stomach cancer Mother        died in 20's   . Hypertension Father        died in his 47's pna likely dementia  . Dementia Father   . Pneumonia Father   . Heart disease Brother        S/P CABG  . Hypertension Other   . Prostate cancer Neg Hx   . Colon cancer Neg Hx     Past Surgical History:  Procedure Laterality Date  . BALLOON DILATION N/A 10/22/2014   Procedure: BALLOON DILATION;  Surgeon: [name], MD;   Location: [facility];  Service: Endoscopy;  Laterality: N/A;  . CARDIAC CATHETERIZATION  07/2008  . CARDIAC CATHETERIZATION N/A 10/24/2015   Procedure: Left Heart Cath and Coronary Angiography;  Surgeon: [name], MD;  Location: [facility];  Service: Cardiovascular;  Laterality: N/A;  . CARDIAC CATHETERIZATION N/A 10/24/2015   Procedure: Coronary Balloon Angioplasty;  Surgeon: [name], MD;  Location: [facility];  Service: Cardiovascular;  Laterality: N/A;  . Carotid Dopplers  09/2009   no sig extracranial stenosis and vertebral arteries had antegrade flow  . CATARACT EXTRACTION W/ INTRAOCULAR LENS  IMPLANT, BILATERAL Bilateral   . ESOPHAGOGASTRODUODENOSCOPY (EGD) WITH ESOPHAGEAL DILATION     "I've  had it stretched 3 times" (10/23/2015)  . ESOPHAGOGASTRODUODENOSCOPY (EGD) WITH PROPOFOL N/A 10/22/2014   Procedure: ESOPHAGOGASTRODUODENOSCOPY (EGD) WITH PROPOFOL;  Surgeon: [name], MD;  [facility];  Service: Endoscopy;  Laterality: N/A;  . MRI of brain  09/2009   chronic microvascular ischemia  . OSA Sleep Study     per Dr. Brett Fairy  . SHOULDER ARTHROSCOPY W/ ROTATOR CUFF REPAIR Right 2008   Social History   Occupational History  . 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 Smoking status: Former Smoker    Packs/day: 0.12    Years: 4.00    Pack years: 0.48    Types: Cigarettes    Last attempt to quit: 03/16/1969    Years since quitting: 48.5  . Smokeless tobacco: Never Used  . Tobacco comment: 1 pack/week for about 4 years, quit 1971  Substance and Sexual Activity  . Alcohol use: No    Alcohol/week: 0.0 oz    Comment: 10/23/2015 "Quit all alcohol in 1970's"  . Drug use: No  . Sexual activity: Never

## 2017-09-09 NOTE — Progress Notes (Signed)
ANTICOAGULATION CONSULT NOTE - Initial Consult  Pharmacy Consult for Coumadin Indication: atrial fibrillation  Allergies  Allergen Reactions  . Penicillins Shortness Of Breath and Swelling    Has patient had a PCN reaction causing immediate rash, facial/tongue/throat swelling, SOB or lightheadedness with hypotensio: YES Has patient had a PCN reaction causing severe rash involving mucus membranes or skin necrosis: NO Has patient had a PCN reaction that required hospitalization NO Has patient had a PCN reaction occurring within the last 10 years: NO If all of the above answers are "NO", then may proceed with Cephalosporin use.  [name] Omeprazole Other (See Comments)    Causes legs to hurt and feels like pins sticking his feet.  . Pravastatin Other (See Comments)    myalgias  . Atorvastatin Other (See Comments)    No energy, just felt bad    . Biaxin [Clarithromycin] Diarrhea  . Doxycycline Other (See Comments)    GI upset   Labs: No results for input(s): HGB, HCT, PLT, APTT, LABPROT, INR, HEPARINUNFRC, HEPRLOWMOCWT, CREATININE, CKTOTAL, CKMB, TROPONINI in the last 72 hours.  CrCl cannot be calculated (Patient's most recent lab result is older than the maximum 21 days allowed.).   Medical History: Past Medical History:  Diagnosis Date  . Bradycardia    a. nocturnal with pauses overnight on tele likely due to OSA 10/2015.  [name] CKD (chronic kidney disease), stage III ([place])   . Coronary artery disease    a. prev nonobst. b. LHC 10/24/15: occlusion of small diffusely diseased OM2 s/p balloon angioplasty, mild nonobstructive disease of mLAD and mRCA, normal LVEDP  . Diverticulosis   . Esophageal reflux   . Esophageal stricture from GERD 06/08/2011   With stricture at GE junction on EGD, dilated 06/2011   . Gastric ulcer    on EGD 2013  . Gout, unspecified   . Hyperlipidemia   . Hypertension   . Obesity, unspecified   . Orthopnea   . OSA on CPAP   . Paroxysmal nocturnal dyspnea   .  Persistent atrial fibrillation ([place])   . Vertigo     Assessment: 84 yo on Coumadin 4mg  daily exc 6mg  on Sun/Thurs PTA for Afib. Dr. Sharol Given ok to continue anticoag even if he has to be taken to OR. INR on admit is low at 1.42. Hgb 16.3, plts wnl.  Goal of Therapy:  INR 2-3 Monitor platelets by anticoagulation protocol: Yes   Plan:  Give Coumadin 6mg  PO x 1 Monitor daily INR, CBC, s/s of bleed  Miroslava Santellan J 09/09/2017,10:36 AM

## 2017-09-09 NOTE — H&P (Signed)
History and Physical    [name] [id number] DOB: Oct 09, 1930 DOA: 09/09/2017  PCP: Tonia Ghent, MD   Patient coming from: Home, Dr. Jess Barters office   Chief Complaint: Right toe infection  HPI: [name] is a 84 y.o. male with medical history significant of coronary artery disease status post left heart cath on 10/24/2015 status post balloon angioplasty of OM 2, stage III CKD, obstructive sleep apnea on CPAP, hypertension, hyperlipidemia, gout, chronic atrial fibrillation on warfarin who comes in with concern for infected right hallux.  Patient reports that approximately 3 weeks ago he began to experience a "gout flare" with severe toe pain.  The prodraga have progressed with continued pain and some erythema and swelling of the first MTP joint.  He began to take his colchicine twice a day as prescribed with resolution of the pain however he began to develop erythema over the joint.  The swelling did not improve much.  The erythema began to spread proximally up to his middle foot until approximately 4 days ago when he was started on 1 dose of doxycycline and then transition to cefdinir.  Erythema has decreased significantly since then.  He reports only some soreness in the area but nothing similar to his gout pain.  He denies any fevers, nausea, vomiting, diarrhea, chills, chest pain, syncope/presyncope, cough, congestion, rhinorrhea.  She was seizing today by Dr. Sharol Given who wanted patient admitted and placed on IV antibiotics to evaluate for possible surgery tomorrow on 09/10/2017.   Review of Systems: As per HPI otherwise 10 point review of systems negative.    Past Medical History:  Diagnosis Date  . Bradycardia    a. nocturnal with pauses overnight on tele likely due to OSA 10/2015.  [name] CKD (chronic kidney disease), stage III ([place])   . Coronary artery disease    a. prev nonobst. b. LHC 10/24/15: occlusion of small diffusely diseased OM2 s/p balloon angioplasty, mild nonobstructive  disease of mLAD and mRCA, normal LVEDP  . Diverticulosis   . Esophageal reflux   . Esophageal stricture from GERD 06/08/2011   With stricture at GE junction on EGD, dilated 06/2011   . Gastric ulcer    on EGD 2013  . Gout, unspecified   . Hyperlipidemia   . Hypertension   . Obesity, unspecified   . Orthopnea   . OSA on CPAP   . Paroxysmal nocturnal dyspnea   . Persistent atrial fibrillation ([place])   . Vertigo     Past Surgical History:  Procedure Laterality Date  . BALLOON DILATION N/A 10/22/2014   Procedure: BALLOON DILATION;  Surgeon: [name], MD;  [facility];  Service: Endoscopy;  Laterality: N/A;  . CARDIAC CATHETERIZATION  07/2008  . CARDIAC CATHETERIZATION N/A 10/24/2015   Procedure: Left Heart Cath and Coronary Angiography;  Surgeon: [name], MD;  Location: [facility];  Service: Cardiovascular;  Laterality: N/A;  . CARDIAC CATHETERIZATION N/A 10/24/2015   Procedure: Coronary Balloon Angioplasty;  Surgeon: [name], MD;  Location: [facility];  Service: Cardiovascular;  Laterality: N/A;  . Carotid Dopplers  09/2009   no sig extracranial stenosis and vertebral arteries had antegrade flow  . CATARACT EXTRACTION W/ INTRAOCULAR LENS  IMPLANT, BILATERAL Bilateral   . ESOPHAGOGASTRODUODENOSCOPY (EGD) WITH ESOPHAGEAL DILATION     "I've had it stretched 3 times" (10/23/2015)  . ESOPHAGOGASTRODUODENOSCOPY (EGD) WITH PROPOFOL N/A 10/22/2014   Procedure: ESOPHAGOGASTRODUODENOSCOPY (EGD) WITH PROPOFOL;  Surgeon: [name], MD;  Location:  [facility];  Service: Endoscopy;  Laterality: N/A;  . MRI of brain  09/2009   chronic microvascular ischemia  . OSA Sleep Study     per Dr. Brett Fairy  . SHOULDER ARTHROSCOPY W/ ROTATOR CUFF REPAIR Right 2008     reports that he quit smoking about 48 years ago. His smoking use included cigarettes. He has a 0.48 pack-year smoking history. He has never used smokeless tobacco. He reports that he does not drink  alcohol or use drugs.  Allergies  Allergen Reactions  . Penicillins Shortness Of Breath and Swelling    Has patient had a PCN reaction causing immediate rash, facial/tongue/throat swelling, SOB or lightheadedness with hypotensio: YES Has patient had a PCN reaction causing severe rash involving mucus membranes or skin necrosis: NO Has patient had a PCN reaction that required hospitalization NO Has patient had a PCN reaction occurring within the last 10 years: NO If all of the above answers are "NO", then may proceed with Cephalosporin use.  [name] Omeprazole Other (See Comments)    Causes legs to hurt and feels like pins sticking his feet.  . Pravastatin Other (See Comments)    myalgias  . Atorvastatin Other (See Comments)    No energy, just felt bad    . Biaxin [Clarithromycin] Diarrhea  . Doxycycline Other (See Comments)    GI upset    Family History  Problem Relation Age of Onset  . Stomach cancer Mother        died in 51's   . Hypertension Father        died in his 43's pna likely dementia  . Dementia Father   . Pneumonia Father   . Heart disease Brother        S/P CABG  . Hypertension Other   . Prostate cancer Neg Hx   . Colon cancer Neg Hx    Unacceptable: Noncontributory, unremarkable, or negative. Acceptable: Family history reviewed and not pertinent (If you reviewed it)  Prior to Admission medications   Medication Sig Start Date End Date Taking? Authorizing Provider  colchicine (COLCRYS) 0.6 MG tablet Take 1 tablet (0.6 mg total) by mouth 2 (two) times daily as needed (for gout). Okay to fill with colcrys 11/24/16   Tonia Ghent, MD  diltiazem (CARDIZEM CD) 180 MG 24 hr capsule TAKE 1 CAPSULE (180 MG TOTAL) BY MOUTH DAILY. 06/29/17   Sherren Mocha, MD  fluticasone [facility]) 50 MCG/ACT nasal spray Place 1-2 sprays into both nostrils daily as needed for allergies.     [provider]  furosemide (LASIX) 20 MG tablet TAKE 1 TABLET (20 MG TOTAL) BY MOUTH  DAILY. 06/29/17   Sherren Mocha, MD  lisinopril (PRINIVIL,ZESTRIL) 20 MG tablet TAKE 1 TABLET (20 MG TOTAL) BY MOUTH DAILY. 05/14/17   Sherren Mocha, MD  metoprolol tartrate (LOPRESSOR) 25 MG tablet TAKE 1/2 TABLET BY MOUTH TWICE A DAY 09/28/16   Sherren Mocha, MD  nitroGLYCERIN (NITROSTAT) 0.4 MG SL tablet Place 1 tablet (0.4 mg total) under the tongue every 5 (five) minutes x 3 doses as needed for chest pain. 11/09/15   Lyda Jester M, PA-C  omeprazole (PRILOSEC) 40 MG capsule TAKE ONE CAPSULE BY MOUTH EVERY DAY 08/26/17   [name], MD  ranitidine (ZANTAC) 150 MG tablet Take 1 tablet (150 mg total) by mouth 2 (two) times daily as needed for heartburn. 07/13/17   Tonia Ghent, MD  warfarin (COUMADIN) 4 MG tablet TAKE AS DIRECTED BY COUMADIN CLINIC 08/25/17  Sherren Mocha, MD    Physical Exam: There were no vitals filed for this visit.  Constitutional: NAD, calm, comfortable There were no vitals filed for this visit. Eyes: Anicteric sclera ENMT: Moist mucous membranes Neck: Pickwickian neck Respiratory: clear to auscultation bilaterally, no wheezing, no crackles. Normal respiratory effort. No accessory muscle use.  Cardiovascular: Irregularly irregular, no murmurs Abdomen: no tenderness, no masses palpated. No hepatosplenomegaly. Bowel sounds positive.  Musculoskeletal: 1+ lower extremity edema, right first MTP joint significantly swollen but not particularly tender to palpation or flexion or extension of the hallux on the right Skin: Large irregular area of erythema surrounding toe extending slightly proximally but much improved from picture approximately 5 days ago prior to starting antibiotics Neurologic: Intact, moving all extremities Psychiatric: Normal judgment and insight. Alert and oriented x 3. Normal mood.     Labs on Admission: I have personally reviewed following labs and imaging studies  CBC: No results for input(s): WBC, NEUTROABS, HGB, HCT, MCV, PLT in  the last 168 hours. Basic Metabolic Panel: No results for input(s): NA, K, CL, CO2, GLUCOSE, BUN, CREATININE, CALCIUM, MG, PHOS in the last 168 hours. GFR: CrCl cannot be calculated (Patient's most recent lab result is older than the maximum 21 days allowed.). Liver Function Tests: No results for input(s): AST, ALT, ALKPHOS, BILITOT, PROT, ALBUMIN in the last 168 hours. No results for input(s): LIPASE, AMYLASE in the last 168 hours. No results for input(s): AMMONIA in the last 168 hours. Coagulation Profile: No results for input(s): INR, PROTIME in the last 168 hours. Cardiac Enzymes: No results for input(s): CKTOTAL, CKMB, CKMBINDEX, TROPONINI in the last 168 hours. BNP (last 3 results) No results for input(s): PROBNP in the last 8760 hours. HbA1C: No results for input(s): HGBA1C in the last 72 hours. CBG: No results for input(s): GLUCAP in the last 168 hours. Lipid Profile: No results for input(s): CHOL, HDL, LDLCALC, TRIG, CHOLHDL, LDLDIRECT in the last 72 hours. Thyroid Function Tests: No results for input(s): TSH, T4TOTAL, FREET4, T3FREE, THYROIDAB in the last 72 hours. Anemia Panel: No results for input(s): VITAMINB12, FOLATE, FERRITIN, TIBC, IRON, RETICCTPCT in the last 72 hours. Urine analysis:    Component Value Date/Time   COLORURINE YELLOW 09/22/2009 1640   APPEARANCEUR CLEAR 09/22/2009 1640   LABSPEC 1.011 09/22/2009 1640   PHURINE 5.5 09/22/2009 1640   GLUCOSEU NEGATIVE 09/22/2009 1640   HGBUR NEGATIVE 09/22/2009 1640   BILIRUBINUR NEGATIVE 09/22/2009 1640   KETONESUR NEGATIVE 09/22/2009 1640   PROTEINUR NEGATIVE 09/22/2009 1640   UROBILINOGEN 0.2 09/22/2009 1640   NITRITE NEGATIVE 09/22/2009 1640   LEUKOCYTESUR  09/22/2009 1640    NEGATIVE MICROSCOPIC NOT DONE ON URINES WITH NEGATIVE PROTEIN, BLOOD, LEUKOCYTES, NITRITE, OR GLUCOSE <1000 mg/dL.    Radiological Exams on Admission: No results found.  EKG: Independently reviewed.  None performed, not  indicated  Assessment/Plan Active Problems:   Gout   OBESITY   GASTROESOPHAGEAL REFLUX DISEASE   OSA on CPAP   Chronic anticoagulation   Persistent atrial fibrillation (H[facility])   Hypertension   Hyperlipidemia   CAD S/P percutaneous coronary angioplasty   Septic arthritis ([facility])   #) Right hallux cellulitis superimposed on gout flare: Currently there is no evidence of septic arthritis though the patient has been on antibiotics.  Generally his gout flare appears to resolved.  He is exquisitely tender to that area.  He does continue to have a fairly impressive effusion on the area however tapping this would be precarious in the setting of  significant overlying erythema.  Clinically at least he appears to be doing better per the picture approximately 1 week ago.  He does not have any systemic signs of infection on exam or by history. -Obtain CBC, procalcitonin, ESR, CRP -Start IV ceftriaxone 1 g daily, vancomycin per pharmacy -Blood cultures x2 obtained - Orthopedic surgery consult, Dr. Sharol Given see patient in the morning -N.p.o. at midnight -Continue colchicine 0.6 mg twice daily -Obtain uric acid level though this is unlikely to be helpful in the setting of recent acute gout exacerbation  #) Chronic atrial for ablation: Per Dr. Jess Barters recommendations okay for patient to continue warfarin even if he is going to the OR. -Continue warfarin 4 mg nightly - INR daily -Pharmacy consult for warfarin management -Continue diltiazem 180 mg daily  #) Hypertension/coronary artery disease status post balloon angioplasty: -Continue metoprolol tartrate 12.5 mg twice daily -Continue lisinopril 20 mg daily  #) Chronic diastolic heart failure: -Hold furosemide 20 mg daily  #) Stage III chronic kidney disease: No recent values in the chart. -Hold nephrotoxins -Obtain CMP to evaluate for creatinine  Fluids: Tolerating p.o. Electrolytes: Monitor and supplement Nutrition: Cardiac diet, n.p.o. at  midnight  Prophylaxis: On warfarin, therapeutic  Disposition: Pending evaluation by Dr. Sharol Given and improvement on IV antibiotics  Full code     Cristy Folks MD [facility]  If 7PM-7AM, please contact night-coverage [email or web address] Password [facility]  09/09/2017, 10:15 AM

## 2017-09-10 ENCOUNTER — Encounter (HOSPITAL_COMMUNITY): Payer: Self-pay | Admitting: Surgery

## 2017-09-10 ENCOUNTER — Inpatient Hospital Stay (HOSPITAL_COMMUNITY): Payer: Medicare Other | Admitting: Certified Registered Nurse Anesthetist

## 2017-09-10 ENCOUNTER — Encounter (HOSPITAL_COMMUNITY)
Admission: AD | Disposition: A | Discharge status: Home Health | Payer: Self-pay | Source: Ambulatory Visit | Attending: Internal Medicine

## 2017-09-10 DIAGNOSIS — E7849 Other hyperlipidemia: Secondary | ICD-10-CM

## 2017-09-10 DIAGNOSIS — Z9861 Coronary angioplasty status: Secondary | ICD-10-CM

## 2017-09-10 DIAGNOSIS — L02611 Cutaneous abscess of right foot: Secondary | ICD-10-CM

## 2017-09-10 DIAGNOSIS — I10 Essential (primary) hypertension: Secondary | ICD-10-CM

## 2017-09-10 DIAGNOSIS — M1A0710 Idiopathic chronic gout, right ankle and foot, without tophus (tophi): Secondary | ICD-10-CM

## 2017-09-10 DIAGNOSIS — I2510 Atherosclerotic heart disease of native coronary artery without angina pectoris: Secondary | ICD-10-CM

## 2017-09-10 DIAGNOSIS — Z7901 Long term (current) use of anticoagulants: Secondary | ICD-10-CM

## 2017-09-10 HISTORY — PX: I & D EXTREMITY: SHX5045

## 2017-09-10 LAB — BASIC METABOLIC PANEL WITH GFR
BUN: 18 mg/dL (ref 8–23)
Chloride: 105 mmol/L (ref 98–111)
Creatinine, Ser: 1.23 mg/dL (ref 0.61–1.24)
GFR calc non Af Amer: 52 mL/min — ABNORMAL LOW (ref >60)
Glucose, Bld: 121 mg/dL — ABNORMAL HIGH (ref 70–99)
Potassium: 4.1 mmol/L (ref 3.5–5.1)

## 2017-09-10 LAB — BASIC METABOLIC PANEL
Anion gap: 8 (ref 5–15)
CO2: 25 mmol/L (ref 22–32)
Calcium: 8.6 mg/dL — ABNORMAL LOW (ref 8.9–10.3)
GFR calc Af Amer: >60 mL/min (ref >60)
Sodium: 138 mmol/L (ref 135–145)

## 2017-09-10 LAB — PROCALCITONIN: Procalcitonin: <0.1 ng/mL

## 2017-09-10 LAB — CBC
HCT: 36.7 % — ABNORMAL LOW (ref 39.0–52.0)
Hemoglobin: 12.2 g/dL — ABNORMAL LOW (ref 13.0–17.0)
MCH: 29.3 pg (ref 26.0–34.0)
MCHC: 33.2 g/dL (ref 30.0–36.0)
MCV: 88.2 fL (ref 78.0–100.0)
Platelets: 187 K/uL (ref 150–400)
RBC: 4.16 MIL/uL — ABNORMAL LOW (ref 4.22–5.81)
RDW: 13.7 % (ref 11.5–15.5)
WBC: 5.3 K/uL (ref 4.0–10.5)

## 2017-09-10 LAB — PROTIME-INR
INR: 1.65
Prothrombin Time: 19.4 seconds — ABNORMAL HIGH (ref 11.4–15.2)

## 2017-09-10 SURGERY — IRRIGATION AND DEBRIDEMENT EXTREMITY
Anesthesia: Monitor Anesthesia Care | Site: Foot | Laterality: Right

## 2017-09-10 MED ORDER — 0.9 % SODIUM CHLORIDE (POUR BTL) OPTIME
TOPICAL | Status: DC | PRN
  Administered 2017-09-10: 1000 mL

## 2017-09-10 MED ORDER — PROMETHAZINE HCL 25 MG/ML IJ SOLN: 6.2500 mg | INTRAMUSCULAR | Status: DC | PRN

## 2017-09-10 MED ORDER — MEPERIDINE HCL 50 MG/ML IJ SOLN: 6.2500 mg | INTRAMUSCULAR | Status: DC | PRN

## 2017-09-10 MED ORDER — PROPOFOL 10 MG/ML IV BOLUS
INTRAVENOUS | Status: AC
  Filled 2017-09-10: qty 40

## 2017-09-10 MED ORDER — GLYCOPYRROLATE 0.2 MG/ML IJ SOLN
INTRAMUSCULAR | Status: DC | PRN
  Administered 2017-09-10: 0.2 mg via INTRAVENOUS

## 2017-09-10 MED ORDER — LIDOCAINE HCL (CARDIAC) PF 100 MG/5ML IV SOSY
PREFILLED_SYRINGE | INTRAVENOUS | Status: DC | PRN
  Administered 2017-09-10: 60 mg via INTRATRACHEAL

## 2017-09-10 MED ORDER — PROPOFOL 10 MG/ML IV BOLUS
INTRAVENOUS | Status: DC | PRN
  Administered 2017-09-10 (×2): 30 mg via INTRAVENOUS

## 2017-09-10 MED ORDER — FENTANYL CITRATE (PF) 100 MCG/2ML IJ SOLN: 25.0000 ug | INTRAMUSCULAR | Status: DC | PRN

## 2017-09-10 MED ORDER — LIDOCAINE 2% (20 MG/ML) 5 ML SYRINGE
INTRAMUSCULAR | Status: AC
  Filled 2017-09-10: qty 5

## 2017-09-10 MED ORDER — PROPOFOL 500 MG/50ML IV EMUL
INTRAVENOUS | Status: DC | PRN
  Administered 2017-09-10: 25 ug/kg/min via INTRAVENOUS

## 2017-09-10 MED ORDER — MIDAZOLAM HCL 2 MG/2ML IJ SOLN
INTRAMUSCULAR | Status: AC
  Filled 2017-09-10: qty 2

## 2017-09-10 MED ORDER — ONDANSETRON HCL 4 MG/2ML IJ SOLN
INTRAMUSCULAR | Status: DC | PRN
  Administered 2017-09-10: 4 mg via INTRAVENOUS

## 2017-09-10 MED ORDER — GLYCOPYRROLATE PF 0.2 MG/ML IJ SOSY
PREFILLED_SYRINGE | INTRAMUSCULAR | Status: AC
  Filled 2017-09-10: qty 1

## 2017-09-10 MED ORDER — SODIUM CHLORIDE 0.9 % IR SOLN
Status: DC | PRN
  Administered 2017-09-10: 3000 mL

## 2017-09-10 MED ORDER — ONDANSETRON HCL 4 MG/2ML IJ SOLN
INTRAMUSCULAR | Status: AC
  Filled 2017-09-10: qty 2

## 2017-09-10 MED ORDER — FENTANYL CITRATE (PF) 250 MCG/5ML IJ SOLN
INTRAMUSCULAR | Status: AC
  Filled 2017-09-10: qty 5

## 2017-09-10 MED ORDER — FENTANYL CITRATE (PF) 100 MCG/2ML IJ SOLN
INTRAMUSCULAR | Status: AC
  Filled 2017-09-10: qty 2

## 2017-09-10 MED ORDER — LACTATED RINGERS IV SOLN
INTRAVENOUS | Status: DC | PRN
  Administered 2017-09-10: 14:00:00 via INTRAVENOUS

## 2017-09-10 MED ORDER — WARFARIN SODIUM 4 MG PO TABS
4.0000 mg | ORAL_TABLET | Freq: Once | ORAL | Status: AC
Start: 2017-09-10 — End: 2017-09-10
  Administered 2017-09-10: 4 mg via ORAL
  Filled 2017-09-10 (×2): qty 1

## 2017-09-10 SURGICAL SUPPLY — 32 items
BENZOIN TINCTURE PRP APPL 2/3 (GAUZE/BANDAGES/DRESSINGS) ×2 IMPLANT
BLADE SURG 21 STRL SS (BLADE) ×2 IMPLANT
BNDG COHESIVE 3X5 TAN STRL LF (GAUZE/BANDAGES/DRESSINGS) ×2 IMPLANT
BNDG COHESIVE 6X5 TAN STRL LF (GAUZE/BANDAGES/DRESSINGS) IMPLANT
BNDG GAUZE ELAST 4 BULKY (GAUZE/BANDAGES/DRESSINGS) ×2 IMPLANT
COVER SURGICAL LIGHT HANDLE (MISCELLANEOUS) ×2 IMPLANT
DRAPE U-SHAPE 47X51 STRL (DRAPES) ×2 IMPLANT
DRSG ADAPTIC 3X8 NADH LF (GAUZE/BANDAGES/DRESSINGS) ×2 IMPLANT
DRSG PAD ABDOMINAL 8X10 ST (GAUZE/BANDAGES/DRESSINGS) ×2 IMPLANT
DURAPREP 26ML APPLICATOR (WOUND CARE) ×2 IMPLANT
ELECT REM PT RETURN 9FT ADLT (ELECTROSURGICAL)
ELECTRODE REM PT RTRN 9FT ADLT (ELECTROSURGICAL) IMPLANT
GAUZE SPONGE 4X4 12PLY STRL (GAUZE/BANDAGES/DRESSINGS) ×2 IMPLANT
GLOVE BIOGEL PI IND STRL 9 (GLOVE) ×1 IMPLANT
GLOVE BIOGEL PI INDICATOR 9 (GLOVE) ×1
GLOVE SURG ORTHO 9.0 STRL STRW (GLOVE) ×2 IMPLANT
GOWN STRL REUS W/ TWL XL LVL3 (GOWN DISPOSABLE) ×2 IMPLANT
GOWN STRL REUS W/TWL XL LVL3 (GOWN DISPOSABLE) ×2
HANDPIECE INTERPULSE COAX TIP (DISPOSABLE) ×1
KIT BASIN OR (CUSTOM PROCEDURE TRAY) ×2 IMPLANT
KIT TURNOVER KIT B (KITS) ×2 IMPLANT
MANIFOLD NEPTUNE II (INSTRUMENTS) ×2 IMPLANT
NS IRRIG 1000ML POUR BTL (IV SOLUTION) ×2 IMPLANT
PACK ORTHO EXTREMITY (CUSTOM PROCEDURE TRAY) ×2 IMPLANT
PAD ARMBOARD 7.5X6 YLW CONV (MISCELLANEOUS) ×2 IMPLANT
SET HNDPC FAN SPRY TIP SCT (DISPOSABLE) ×1 IMPLANT
STOCKINETTE IMPERVIOUS 9X36 MD (GAUZE/BANDAGES/DRESSINGS) IMPLANT
SWAB COLLECTION DEVICE MRSA (MISCELLANEOUS) ×2 IMPLANT
SWAB CULTURE ESWAB REG 1ML (MISCELLANEOUS) IMPLANT
TOWEL OR 17X26 10 PK STRL BLUE (TOWEL DISPOSABLE) ×2 IMPLANT
TUBE CONNECTING 12X1/4 (SUCTIONS) ×2 IMPLANT
YANKAUER SUCT BULB TIP NO VENT (SUCTIONS) ×2 IMPLANT

## 2017-09-10 NOTE — Progress Notes (Signed)
ANTICOAGULATION CONSULT NOTE - Initial Consult  Pharmacy Consult for Coumadin Indication: atrial fibrillation  Allergies  Allergen Reactions  . Penicillins Shortness Of Breath and Swelling    PATIENT HAS HAD A PCN REACTION WITH IMMEDIATE RASH, FACIAL/TONGUE/THROAT SWELLING, SOB, OR LIGHTHEADEDNESS WITH HYPOTENSION:  #  #  YES  #  Has patient had a PCN reaction causing severe rash involving mucus membranes or skin necrosis: NO Has patient had a PCN reaction that required hospitalization NO Has patient had a PCN reaction occurring within the last 10 years: NO If all of the above answers are "NO", then may proceed with Cephalosporin use.  . Atorvastatin Other (See Comments)    No energy, just felt bad    . Pravastatin Other (See Comments)    myalgias  . Biaxin [Clarithromycin] Diarrhea  . Doxycycline Other (See Comments)    GI upset   Labs: Recent Labs    09/09/17 1048 09/09/17 1142 09/10/17 0421  HGB  --  16.3 12.2*  HCT  --  49.8 36.7*  PLT  --  255 187  LABPROT 17.2*  --  19.4*  INR 1.42  --  1.65  CREATININE 1.39* 1.30* 1.23    Estimated Creatinine Clearance: 49.2 mL/min (by C-G formula based on SCr of 1.23 mg/dL).   Medical History: Past Medical History:  Diagnosis Date  . Bradycardia    a. nocturnal with pauses overnight on tele likely due to OSA 10/2015.  [name] CKD (chronic kidney disease), stage III ([place])   . Coronary artery disease    a. prev nonobst. b. LHC 10/24/15: occlusion of small diffusely diseased OM2 s/p balloon angioplasty, mild nonobstructive disease of mLAD and mRCA, normal LVEDP  . Diverticulosis   . Esophageal reflux   . Esophageal stricture from GERD 06/08/2011   With stricture at GE junction on EGD, dilated 06/2011   . Gastric ulcer    on EGD 2013  . Gout, unspecified    "on daily RX" (09/09/2017)  . History of blood transfusion 02/1964   "when I had right little finger cut off"  . Hyperlipidemia   . Hypertension   . Obesity, unspecified   .  Orthopnea   . OSA on CPAP   . Paroxysmal nocturnal dyspnea   . Persistent atrial fibrillation ([place])   . Vertigo    Assessment: 84 yo on Coumadin 4mg  daily exc 6mg  on Sun/Thurs PTA for Afib. Dr. Sharol Given ok to continue anticoag even if he has to be taken to OR. INR on admit is low but now trending up to 1.65. Hgb 12.2, plts wnl.  Goal of Therapy:  INR 2-3 Monitor platelets by anticoagulation protocol: Yes   Plan:  Give Coumadin 4mg  PO x 1 Monitor daily INR, CBC, s/s of bleed  Leon Goodnow J 09/10/2017,8:02 AM

## 2017-09-10 NOTE — Anesthesia Postprocedure Evaluation (Signed)
Anesthesia Post Note  Patient: [name]  Procedure(s) Performed: RIGHT FOOT DEBRIDEMENT (Right Foot)     Patient location during evaluation: PACU Anesthesia Type: Regional Level of consciousness: awake and alert Pain management: pain level controlled Vital Signs Assessment: post-procedure vital signs reviewed and stable Respiratory status: spontaneous breathing Cardiovascular status: stable Anesthetic complications: no    Last Vitals:  Vitals:   09/10/17 1525 09/10/17 1540  BP: 123/64 119/83  Pulse: 75 72  Resp: 19 16  Temp:    SpO2: 98% 100%    Last Pain:  Vitals:   09/10/17 1525  TempSrc:   PainSc: 0-No pain                 Nolon Nations

## 2017-09-10 NOTE — Consult Note (Signed)
ORTHOPAEDIC CONSULTATION  REQUESTING PHYSICIAN: Caren Griffins, MD  Chief Complaint: Cellulitis right foot with history of gout  HPI: [name] is a 84 y.o. male who presents with cellulitis extending from the right great toe MTP joint extending to the hindfoot.  Patient has been on IV antibiotics and despite gout medication and IV antibiotics patient still has persistent cellulitis.  Past Medical History:  Diagnosis Date  . Bradycardia    a. nocturnal with pauses overnight on tele likely due to OSA 10/2015.  [name] CKD (chronic kidney disease), stage III ([place])   . Coronary artery disease    a. prev nonobst. b. LHC 10/24/15: occlusion of small diffusely diseased OM2 s/p balloon angioplasty, mild nonobstructive disease of mLAD and mRCA, normal LVEDP  . Diverticulosis   . Esophageal reflux   . Esophageal stricture from GERD 06/08/2011   With stricture at GE junction on EGD, dilated 06/2011   . Gastric ulcer    on EGD 2013  . Gout, unspecified    "on daily RX" (09/09/2017)  . History of blood transfusion 02/1964   "when I had right little finger cut off"  . Hyperlipidemia   . Hypertension   . Obesity, unspecified   . Orthopnea   . OSA on CPAP   . Paroxysmal nocturnal dyspnea   . Persistent atrial fibrillation ([place])   . Vertigo    Past Surgical History:  Procedure Laterality Date  . BALLOON DILATION N/A 10/22/2014   Procedure: BALLOON DILATION;  Surgeon: [name], MD;  Location: [facility];  Service: Endoscopy;  Laterality: N/A;  . CARDIAC CATHETERIZATION  07/2008  . CARDIAC CATHETERIZATION N/A 10/24/2015   Procedure: Left Heart Cath and Coronary Angiography;  Surgeon: [name], MD;  Location: [facility];  Service: Cardiovascular;  Laterality: N/A;  . CARDIAC CATHETERIZATION N/A 10/24/2015   Procedure: Coronary Balloon Angioplasty;  Surgeon: [name], MD;  Location: [facility];  Service: Cardiovascular;  Laterality: N/A;  . Carotid  Dopplers  09/2009   no sig extracranial stenosis and vertebral arteries had antegrade flow  . CATARACT EXTRACTION W/ INTRAOCULAR LENS  IMPLANT, BILATERAL Bilateral   . ESOPHAGOGASTRODUODENOSCOPY (EGD) WITH ESOPHAGEAL DILATION     "I've had it stretched 3 times" (10/23/2015)  . ESOPHAGOGASTRODUODENOSCOPY (EGD) WITH ESOPHAGEAL DILATION  06/2011   /medical hx above  (09/09/2017)  . ESOPHAGOGASTRODUODENOSCOPY (EGD) WITH PROPOFOL N/A 10/22/2014   Procedure: ESOPHAGOGASTRODUODENOSCOPY (EGD) WITH PROPOFOL;  Surgeon: [name], MD;  Location: [facility];  Service: Endoscopy;  Laterality: N/A;  . FINGER SURGERY Right 02/1964   "cut little finger off; had it reattached"  . MRI of brain  09/2009   chronic microvascular ischemia  . OSA Sleep Study     per Dr. Brett Fairy  . SHOULDER ARTHROSCOPY W/ ROTATOR CUFF REPAIR Right 2008   Social History   Socioeconomic History  . Marital status: Widowed    Spouse name: Not on file  . Number of children: 3  . Years of education: Not on file  . Highest education level: Not on file  Occupational History  . 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 Financial resource strain: Not on file  . Food insecurity:    Worry: Not on file    Inability: Not on file  . Transportation needs:    Medical: Not on file    Non-medical: Not on file  Tobacco Use  . Smoking status:  Former Smoker    Packs/day: 0.12    Years: 4.00    Pack years: 0.48    Types: Cigarettes    Last attempt to quit: 03/16/1969    Years since quitting: 48.5  . Smokeless tobacco: Former Systems developer  . Tobacco comment: "chewed when I smoked; smoked 1 pack/week for about 4 years, quit 1971  Substance and Sexual Activity  . Alcohol use: Not Currently    Alcohol/week: 0.0 oz    Comment: 09/09/2017  "Quit all alcohol in 1970's"  . Drug use: Never  . Sexual activity: Not on file  Lifestyle  . Physical activity:    Days per week: Not on file    Minutes per  session: Not on file  . Stress: Not on file  Relationships  . Social connections:    Talks on phone: Not on file    Gets together: Not on file    Attends religious service: Not on file    Active member of club or organization: Not on file    Attends meetings of clubs or organizations: Not on file    Relationship status: Not on file  Other Topics Concern  . Not on file  Social History Narrative   Lives in [place], married 1953, widowed after 72 years   Retired Administrator, still does Biomedical scientist.   He is not routinely exercising.   Daily caffeine    Family History  Problem Relation Age of Onset  . Stomach cancer Mother        died in 25's   . Hypertension Father        died in his 59's pna likely dementia  . Dementia Father   . Pneumonia Father   . Heart disease Brother        S/P CABG  . Hypertension Other   . Prostate cancer Neg Hx   . Colon cancer Neg Hx    - negative except otherwise stated in the family history section Allergies  Allergen Reactions  . Penicillins Shortness Of Breath and Swelling    PATIENT HAS HAD A PCN REACTION WITH IMMEDIATE RASH, FACIAL/TONGUE/THROAT SWELLING, SOB, OR LIGHTHEADEDNESS WITH HYPOTENSION:  #  #  YES  #  Has patient had a PCN reaction causing severe rash involving mucus membranes or skin necrosis: NO Has patient had a PCN reaction that required hospitalization NO Has patient had a PCN reaction occurring within the last 10 years: NO If all of the above answers are "NO", then may proceed with Cephalosporin use.  . Atorvastatin Other (See Comments)    No energy, just felt bad    . Pravastatin Other (See Comments)    myalgias  . Biaxin [Clarithromycin] Diarrhea  . Doxycycline Other (See Comments)    GI upset   Prior to Admission medications   Medication Sig Start Date End Date Taking? Authorizing Provider  acetaminophen (TYLENOL) 500 MG tablet Take 1,000 mg by mouth as needed for mild pain.   Yes [provider]    cefdinir (OMNICEF) 300 MG capsule Take 300 mg by mouth 2 (two) times daily.   Yes [provider]  colchicine (COLCRYS) 0.6 MG tablet Take 1 tablet (0.6 mg total) by mouth 2 (two) times daily as needed (for gout). Okay to fill with colcrys 11/24/16  Yes Tonia Ghent, MD  diltiazem (CARDIZEM CD) 180 MG 24 hr capsule TAKE 1 CAPSULE (180 MG TOTAL) BY MOUTH DAILY. 06/29/17  Yes Sherren Mocha, MD  fluticasone [facility]) 50 MCG/ACT nasal  spray Place 1-2 sprays into both nostrils daily as needed for allergies.    Yes [provider]  furosemide (LASIX) 20 MG tablet TAKE 1 TABLET (20 MG TOTAL) BY MOUTH DAILY. 06/29/17  Yes Sherren Mocha, MD  lisinopril (PRINIVIL,ZESTRIL) 20 MG tablet TAKE 1 TABLET (20 MG TOTAL) BY MOUTH DAILY. 05/14/17  Yes Sherren Mocha, MD  metoprolol tartrate (LOPRESSOR) 25 MG tablet TAKE 1/2 TABLET BY MOUTH TWICE A DAY 09/28/16  Yes Sherren Mocha, MD  nitroGLYCERIN (NITROSTAT) 0.4 MG SL tablet Place 1 tablet (0.4 mg total) under the tongue every 5 (five) minutes x 3 doses as needed for chest pain. 11/09/15  Yes Simmons, Brittainy M, PA-C  omeprazole (PRILOSEC) 40 MG capsule TAKE ONE CAPSULE BY MOUTH EVERY DAY 08/26/17  Yes [name], MD  warfarin (COUMADIN) 4 MG tablet TAKE AS DIRECTED BY COUMADIN CLINIC Patient taking differently: taking 6mg  on Monday,thursday,Sunday. taking 4mg  on Tues, Wed, Fri, Sat. 08/25/17  Yes Sherren Mocha, MD  ranitidine (ZANTAC) 150 MG tablet Take 1 tablet (150 mg total) by mouth 2 (two) times daily as needed for heartburn. Patient not taking: Reported on 09/09/2017 07/13/17   Tonia Ghent, MD   No results found. - pertinent xrays, CT, MRI studies were reviewed and independently interpreted  Positive ROS: All other systems have been reviewed and were otherwise negative with the exception of those mentioned in the HPI and as above.  Physical Exam: General: Alert, no acute distress Psychiatric: Patient is competent for consent  with normal mood and affect Lymphatic: No axillary or cervical lymphadenopathy Cardiovascular: No pedal edema Respiratory: No cyanosis, no use of accessory musculature GI: No organomegaly, abdomen is soft and non-tender    Images:  @ENCIMAGES @  Labs:  Lab Results  Component Value Date   HGBA1C  07/31/2008    6.1 (NOTE) The ADA recommends the following therapeutic goal for glycemic control related to Hgb A1c measurement: Goal of therapy: <6.5 Hgb A1c  Reference: American Diabetes Association: Clinical Practice Recommendations 2010, Diabetes Care, 2010, 33: (Suppl  1).   ESRSEDRATE 17 (H) 09/09/2017   CRP 1.1 (H) 09/09/2017   LABURIC 9.0 (H) 09/09/2017   LABURIC 10.2 (H) 02/13/2016   LABURIC 9.5 (H) 12/06/2014   REPTSTATUS 09/24/2009 FINAL 09/22/2009   CULT INSIGNIFICANT GROWTH 09/22/2009    Lab Results  Component Value Date   ALBUMIN 4.3 09/09/2017   ALBUMIN 4.4 02/13/2016   ALBUMIN 3.8 11/08/2015   LABURIC 9.0 (H) 09/09/2017   LABURIC 10.2 (H) 02/13/2016   LABURIC 9.5 (H) 12/06/2014    Neurologic: Patient does not have protective sensation bilateral lower extremities.   MUSCULOSKELETAL:   Skin: Examination patient has cellulitis to the midfoot.  He does have a palpable pulse.  The cellulitis is tender to palpation and does have some fluctuation in the midfoot.  There is no cellulitis proximal to the ankle.  No open wounds no tophaceous deposits.  Assessment: Assessment: Cellulitis right foot most likely secondary to acute gout which started when patient was eating seafood in [place].  No resolution with IV antibiotics or several weeks of colchicine.  Plan: Plan: We will plan for irrigation and debridement of the right foot may require several incisions.  Plan for n.p.o. today with surgery this afternoon.  Thank you for the consult and the opportunity to see [name], [place] [phone] 8:03 AM

## 2017-09-10 NOTE — Anesthesia Procedure Notes (Addendum)
Anesthesia Regional Block: Popliteal block   Pre-Anesthetic Checklist: ,, timeout performed, Correct Patient, Correct Site, Correct Laterality, Correct Procedure, Correct Position, site marked, Risks and benefits discussed,  Surgical consent,  Pre-op evaluation,  At surgeon's request and post-op pain management  Laterality: Right  Prep: chloraprep       Needles:  Injection technique: Single-shot  Needle Type: Stimiplex     Needle Length: 10cm  Needle Gauge: 21     Additional Needles:   Procedures:,,,, ultrasound used (permanent image in chart),,,,  Motor weakness within 5 minutes.  Narrative:  Start time: 09/10/2017 1:48 PM End time: 09/10/2017 1:52 PM Injection made incrementally with aspirations every 5 mL.  Performed by: Personally  Anesthesiologist: Nolon Nations, MD  Additional Notes: Nerve located and needle positioned with direct ultrasound guidance. Good perineural spread. Patient tolerated well.

## 2017-09-10 NOTE — Op Note (Signed)
09/09/2017 - 09/10/2017  2:43 PM  PATIENT:  [name]    PRE-OPERATIVE DIAGNOSIS:  Abscess Right Foot, infected gout  POST-OPERATIVE DIAGNOSIS:  Same  PROCEDURE:  RIGHT FOOT DEBRIDEMENT, partial excision of the first metatarsal head excision of the capsule and debridement of the abscess and gout.  Tissue and abscess sent for cultures.  SURGEON:  [name], MD  PHYSICIAN ASSISTANT:None ANESTHESIA:   General  PREOPERATIVE INDICATIONS:  [name] is a  84 y.o. male with a diagnosis of Abscess Right Foot who failed conservative measures and elected for surgical management.    The risks benefits and alternatives were discussed with the patient preoperatively including but not limited to the risks of infection, bleeding, nerve injury, cardiopulmonary complications, the need for revision surgery, among others, and the patient was willing to proceed.  OPERATIVE IMPLANTS: None   @ENCIMAGES @  OPERATIVE FINDINGS: infected tophaceous gouty abscess  OPERATIVE PROCEDURE: Patient was brought the operating room after undergoing a regional anesthetic.  Patient's right lower extremity was then prepped using DuraPrep draped in the sterile field a timeout was called.  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  Partial excision of the first metatarsal head was performed the capsule was excised all the tophaceous gout was excised.  There is no abscess extending dorsally to the foot.  The wound was irrigated with pulsatile lavage.  The remaining capsule and skin was closed using 2-0 nylon.  A sterile dressing was applied patient was taken the PACU in stable condition.   DISCHARGE PLANNING:  Antibiotic duration: Continue IV antibiotics until tissue cultures are finalized and then oral antibiotics for 4 weeks  Weightbearing: Nonweightbearing right lower extremity  Pain medication: Low-dose pain medication opioid  scale ordered  Dressing care/ Wound VAC: Dressing remain intact  Ambulatory devices: Walker  Discharge to: Anticipate discharge to home.  Follow-up: In the office 1 week post operative.

## 2017-09-10 NOTE — Transfer of Care (Signed)
Immediate Anesthesia Transfer of Care Note  Patient: [name]  Procedure(s) Performed: RIGHT FOOT DEBRIDEMENT (Right Foot)  Patient Location: PACU  Anesthesia Type:MAC and Regional  Level of Consciousness: awake, alert  and patient cooperative  Airway & Oxygen Therapy: Patient Spontanous Breathing and Patient connected to nasal cannula oxygen  Post-op Assessment: Report given to RN, Post -op Vital signs reviewed and stable, Patient moving all extremities X 4 and Patient able to stick tongue midline  Post vital signs: Reviewed and stable  Last Vitals:  Vitals Value Taken Time  BP 110/77 09/10/2017  2:55 PM  Temp 36.5 C 09/10/2017  2:55 PM  Pulse 68 09/10/2017  2:55 PM  Resp 14 09/10/2017  2:55 PM  SpO2 97 % 09/10/2017  2:55 PM    Last Pain:  Vitals:   09/10/17 1353  TempSrc:   PainSc: 0-No pain         Complications: No apparent anesthesia complications

## 2017-09-10 NOTE — Progress Notes (Signed)
Patient has self-administered CPAP therapy for HS using his own equipment from home.  Patient is resting comfortably at this time.

## 2017-09-10 NOTE — Anesthesia Preprocedure Evaluation (Signed)
Anesthesia Evaluation  Patient identified by MRN, date of birth, ID band Patient awake    Reviewed: Allergy & Precautions, H&P , NPO status , Patient's Chart, lab work & pertinent test results  History of Anesthesia Complications Negative for: history of anesthetic complications  Airway Mallampati: IV  TM Distance: >3 FB Neck ROM: full    Dental no notable dental hx.    Pulmonary sleep apnea , former smoker,    Pulmonary exam normal breath sounds clear to auscultation       Cardiovascular hypertension, + angina + CAD (50% stenosis of LCx from 2010 cath) and + Orthopnea  + dysrhythmias (takes coumadin and dilt, metop for rate control and anticoagulation) Atrial Fibrillation  Rhythm:Irregular Rate:Normal     Neuro/Psych  Headaches,    GI/Hepatic Neg liver ROS, PUD, GERD  ,Hx of esophageal strictures requiring balloon dilation   Endo/Other  negative endocrine ROS  Renal/GU Renal diseasenegative Renal ROS     Musculoskeletal   Abdominal (+) + obese,   Peds  Hematology negative hematology ROS (+)   Anesthesia Other Findings Dr. Burt Knack from cardiology follows patient, last seen 10/04/2014, coumadin will be held for procedure  Previous surgeries and EGD with dilation, no difficulty  Reproductive/Obstetrics negative OB ROS                             Anesthesia Physical  Anesthesia Plan  ASA: III  Anesthesia Plan: Regional   Post-op Pain Management:    Induction: Intravenous  PONV Risk Score and Plan: Ondansetron and Propofol infusion  Airway Management Planned: Nasal Cannula  Additional Equipment:   Intra-op Plan:   Post-operative Plan:   Informed Consent: I have reviewed the patients History and Physical, chart, labs and discussed the procedure including the risks, benefits and alternatives for the proposed anesthesia with the patient or authorized representative who has  indicated his/her understanding and acceptance.   Dental advisory given  Plan Discussed with: CRNA  Anesthesia Plan Comments:         Anesthesia Quick Evaluation

## 2017-09-10 NOTE — Progress Notes (Signed)
PROGRESS NOTE  [name] CNO:[id number] DOB: 1930/07/27 DOA: 09/09/2017 PCP: Tonia Ghent, MD   LOS: 1 day   Brief Narrative / Interim history: 84 year old male with history of CAD, stage III chronic kidney disease, OSA on CPAP, hypertension, hyperlipidemia, gout, chronic A. fib on Coumadin who was admitted to the hospital on 6/27 with concern for infected right foot/cellulitis.  He states that over the last few weeks he has been having a gout flare which has not been resolving and in fact is gotten worse with worsening redness and swelling.  He was given doxycycline x1 and Ceftin ear as an outpatient about 4 days ago, but despite that he has progressed.  He was seen by Dr. Sharol Given on 6/27 and was directed for admission for IV antibiotics.  Assessment & Plan: Active Problems:   Gout   OBESITY   GASTROESOPHAGEAL REFLUX DISEASE   OSA on CPAP   Chronic anticoagulation   Persistent atrial fibrillation (HCC)   Hypertension   Hyperlipidemia   CAD S/P percutaneous coronary angioplasty   Septic arthritis (HCC)   Cutaneous abscess of right foot   Right hallux cellulitis superimposed on gout flare -This is very erythematous, tender to minimal palpation, discussed with Dr. Sharol Given today and patient will be taken to the operating room for exploration. -Continue Vanco, Clinda, ceftriaxone, blood cultures are no growth to date  Hypertension -Continue lisinopril, diltiazem, metoprolol, blood pressure controlled today  Chronic A. fib -Continue rate control with diltiazem and metoprolol, anticoagulated with Coumadin admitting MD discussed with Dr. Sharol Given and is okay for patient to continue Coumadin  Coronary artery disease -No chest pain, had a cardiac cath in 2017 status post balloon angioplasty  Chronic diastolic CHF -Hold Lasix for now, he appears euvolemic  Chronic kidney disease stage III -Creatinine appears stable and at baseline   DVT prophylaxis: Coumadin Code Status: Full  code Family Communication: daughter present at bedside  Disposition Plan: TBD post op  Consultants:   Orthopedic surgery   Procedures:   None   Antimicrobials:  Vancomycin 6/27 >>  Ceftriaxone 6/27 >>  Clindamycin - periop   Subjective: -Complains of right foot pain, otherwise no other complaints.  No chest pain, no shortness of breath, no abdominal pain, nausea or vomiting  Objective: Vitals:   09/09/17 1427 09/09/17 1950 09/09/17 2240 09/10/17 0454  BP: 126/65 (!) 144/75  123/79  Pulse: 83 94 95 65  Resp:   16   Temp: 98 F (36.7 C) 98.2 F (36.8 C)  97.6 F (36.4 C)  TempSrc: Oral Oral  Oral  SpO2: 98% 97% 96% 98%    Intake/Output Summary (Last 24 hours) at 09/10/2017 1210 Last data filed at 09/10/2017 0900 Gross per 24 hour  Intake 1109.38 ml  Output -  Net 1109.38 ml   There were no vitals filed for this visit.  Examination:  Constitutional: NAD Eyes: PERRL, lids and conjunctivae normal ENMT: Mucous membranes are moist.  Neck: normal, supple, no masses, no thyromegaly Respiratory: clear to auscultation bilaterally, no wheezing, no crackles. Normal respiratory effort. No accessory muscle use.  Cardiovascular: Irregularly irregular, no edema Abdomen: no tenderness. Bowel sounds positive.   Skin: Cellulitic changes right lower foot Neurologic: Nonfocal, ambulatory Psychiatric: Normal judgment and insight. Alert and oriented x 3. Normal mood.    Data Reviewed: I have independently reviewed following labs and imaging studies   CBC: Recent Labs  Lab 09/09/17 1142 09/10/17 0421  WBC 7.6 5.3  NEUTROABS 5.1  --  HGB 16.3 12.2*  HCT 49.8 36.7*  MCV 88.0 88.2  PLT 255 542   Basic Metabolic Panel: Recent Labs  Lab 09/09/17 1048 09/09/17 1142 09/10/17 0421  NA 138 139 138  K 4.4 4.4 4.1  CL 103 101 105  CO2 27 25 25   GLUCOSE 81 82 121*  BUN 18 20 18   CREATININE 1.39* 1.30* 1.23  CALCIUM 9.4 9.5 8.6*  MG  --  2.2  --    GFR: Estimated  Creatinine Clearance: 49.2 mL/min (by C-G formula based on SCr of 1.23 mg/dL). Liver Function Tests: Recent Labs  Lab 09/09/17 1142  AST 26  ALT 29  ALKPHOS 83  BILITOT 0.7  PROT 8.2*  ALBUMIN 4.3   No results for input(s): LIPASE, AMYLASE in the last 168 hours. No results for input(s): AMMONIA in the last 168 hours. Coagulation Profile: Recent Labs  Lab 09/09/17 1048 09/10/17 0421  INR 1.42 1.65   Cardiac Enzymes: No results for input(s): CKTOTAL, CKMB, CKMBINDEX, TROPONINI in the last 168 hours. BNP (last 3 results) No results for input(s): PROBNP in the last 8760 hours. HbA1C: No results for input(s): HGBA1C in the last 72 hours. CBG: No results for input(s): GLUCAP in the last 168 hours. Lipid Profile: No results for input(s): CHOL, HDL, LDLCALC, TRIG, CHOLHDL, LDLDIRECT in the last 72 hours. Thyroid Function Tests: No results for input(s): TSH, T4TOTAL, FREET4, T3FREE, THYROIDAB in the last 72 hours. Anemia Panel: No results for input(s): VITAMINB12, FOLATE, FERRITIN, TIBC, IRON, RETICCTPCT in the last 72 hours. Urine analysis:    Component Value Date/Time   COLORURINE YELLOW 09/22/2009 1640   APPEARANCEUR CLEAR 09/22/2009 1640   LABSPEC 1.011 09/22/2009 1640   PHURINE 5.5 09/22/2009 1640   GLUCOSEU NEGATIVE 09/22/2009 1640   HGBUR NEGATIVE 09/22/2009 1640   BILIRUBINUR NEGATIVE 09/22/2009 1640   KETONESUR NEGATIVE 09/22/2009 1640   PROTEINUR NEGATIVE 09/22/2009 1640   UROBILINOGEN 0.2 09/22/2009 1640   NITRITE NEGATIVE 09/22/2009 1640   LEUKOCYTESUR  09/22/2009 1640    NEGATIVE MICROSCOPIC NOT DONE ON URINES WITH NEGATIVE PROTEIN, BLOOD, LEUKOCYTES, NITRITE, OR GLUCOSE <1000 mg/dL.   Sepsis Labs: Invalid input(s): PROCALCITONIN, LACTICIDVEN  Recent Results (from the past 240 hour(s))  Culture, blood (routine x 2)     Status: None (Preliminary result)   Collection Time: 09/09/17 11:36 AM  Result Value Ref Range Status   Specimen Description BLOOD LEFT  ANTECUBITAL  Final   Special Requests   Final    BOTTLES DRAWN AEROBIC AND ANAEROBIC Blood Culture adequate volume   Culture   Final    NO GROWTH < 24 HOURS Performed at [facility], [address], [place], [place] [address]    Report Status PENDING  Incomplete  Culture, blood (routine x 2)     Status: None (Preliminary result)   Collection Time: 09/09/17 11:44 AM  Result Value Ref Range Status   Specimen Description BLOOD RIGHT ANTECUBITAL  Final   Special Requests   Final    BOTTLES DRAWN AEROBIC AND ANAEROBIC Blood Culture adequate volume   Culture   Final    NO GROWTH < 24 HOURS Performed at [facility], [place] [address], [place], [place] [address]    Report Status PENDING  Incomplete  Surgical PCR screen     Status: None   Collection Time: 09/09/17  2:33 PM  Result Value Ref Range Status   MRSA, PCR NEGATIVE NEGATIVE Final   Staphylococcus aureus NEGATIVE NEGATIVE Final  Comment: (NOTE) The Xpert SA Assay (FDA approved for NASAL specimens in patients 51 years of age and older), is one component of a comprehensive surveillance program. It is not intended to diagnose infection nor to guide or monitor treatment. Performed at [facility], [place] [address], [place], [place] [address]       Radiology Studies: No results found.   Scheduled Meds: . chlorhexidine  60 mL Topical Once  . diltiazem  180 mg Oral Daily  . lisinopril  20 mg Oral Daily  . metoprolol tartrate  12.5 mg Oral BID  . mupirocin ointment  1 application Nasal BID  . pantoprazole  40 mg Oral Daily  . sodium chloride flush  3 mL Intravenous Q12H  . warfarin  4 mg Oral ONCE-1800  . Warfarin - Pharmacist Dosing Inpatient   Does not apply q1800   Continuous Infusions: . sodium chloride 10 mL/hr at 09/09/17 2200  . cefTRIAXone (ROCEPHIN)  IV 1 g (09/10/17 1155)  . clindamycin (CLEOCIN) IV    . vancomycin       Marzetta Board, MD, PhD [facility] Pager [phone]  If  7PM-7AM, please contact night-coverage [email or web address] Password [facility] 09/10/2017, 12:10 PM

## 2017-09-11 ENCOUNTER — Encounter (HOSPITAL_COMMUNITY): Payer: Self-pay | Admitting: Orthopedic Surgery

## 2017-09-11 LAB — PROTIME-INR
INR: 2.27
Prothrombin Time: 24.9 seconds — ABNORMAL HIGH (ref 11.4–15.2)

## 2017-09-11 LAB — GLUCOSE, CAPILLARY: Glucose-Capillary: 136 mg/dL — ABNORMAL HIGH (ref 70–99)

## 2017-09-11 LAB — PROCALCITONIN: Procalcitonin: <0.1 ng/mL

## 2017-09-11 MED ORDER — WARFARIN SODIUM 4 MG PO TABS
4.0000 mg | ORAL_TABLET | Freq: Once | ORAL | Status: AC
  Administered 2017-09-11: 4 mg via ORAL
  Filled 2017-09-11: qty 1

## 2017-09-11 NOTE — Progress Notes (Signed)
PROGRESS NOTE  [name] XBD:[id number] DOB: Jun 11, 1930 DOA: 09/09/2017 PCP: Tonia Ghent, MD   LOS: 2 days   Brief Narrative / Interim history: 84 year old male with history of CAD, stage III chronic kidney disease, OSA on CPAP, hypertension, hyperlipidemia, gout, chronic A. fib on Coumadin who was admitted to the hospital on 6/27 with concern for infected right foot/cellulitis.  He states that over the last few weeks he has been having a gout flare which has not been resolving and in fact is gotten worse with worsening redness and swelling.  He was given doxycycline x1 and Ceftin ear as an outpatient about 4 days ago, but despite that he has progressed.  He was seen by Dr. Sharol Given on 6/27 and was directed for admission for IV antibiotics.  He is status post I&D on 6/28  Assessment & Plan: Active Problems:   Gout   OBESITY   GASTROESOPHAGEAL REFLUX DISEASE   OSA on CPAP   Chronic anticoagulation   Persistent atrial fibrillation (HCC)   Hypertension   Hyperlipidemia   CAD S/P percutaneous coronary angioplasty   Septic arthritis (HCC)   Cutaneous abscess of right foot   Right hallux cellulitis superimposed on gout flare -Orthopedic surgery Dr. Sharol Given consulted, patient was taken to the operating room on 6/28 and status post right foot debridement, partial excision of the first metatarsal head, excision of the capsule and debridement of the abscess and gout.  Cultures were sent, no growth today -Continue Vanc - ceftriaxone until cultures are back.  Hypertension -Continue lisinopril, diltiazem, metoprolol, BP within normal range  Chronic A. fib -Continue rate control with diltiazem and metoprolol, anticoagulated with Coumadin  -Continue Coumadin per pharmacy  Coronary artery disease -No chest pain, had a cardiac cath in 2017 status post balloon angioplasty   Chronic diastolic CHF -Appears euvolemic, resume Lasix tomorrow  Chronic kidney disease stage III -Repeat BMP  tomorrow to prior to resuming Lasix   DVT prophylaxis: Coumadin Code Status: Full code Family Communication: daughter present at bedside  Disposition Plan: TBD post op  Consultants:   Orthopedic surgery   Procedures:   None   Antimicrobials:  Vancomycin 6/27 >>  Ceftriaxone 6/27 >>  Clindamycin - periop   Subjective: -Feeling well this morning, denies any chest pain, denies any shortness of breath.  Objective: Vitals:   09/10/17 1540 09/10/17 1549 09/10/17 1558 09/10/17 2034  BP: 119/83 128/82 123/70 119/65  Pulse: 72 66 73 87  Resp: 16 20 18 16   Temp:  97.7 F (36.5 C) (!) 97.5 F (36.4 C) 98.3 F (36.8 C)  TempSrc:    Oral  SpO2: 100% 99% 99% 97%    Intake/Output Summary (Last 24 hours) at 09/11/2017 1119 Last data filed at 09/11/2017 0900 Gross per 24 hour  Intake 1414.99 ml  Output 335 ml  Net 1079.99 ml   There were no vitals filed for this visit.  Examination:  Constitutional: NAD Respiratory: CTA biL, no wheezing, no crackles, moves air well Cardiovascular: irregular, no murmurs heard, no edema  Abdomen: soft, NT, ND, BS + Skin: Right foot ACE wrapped  Neurologic: no focal findings, equal strength    Data Reviewed: I have independently reviewed following labs and imaging studies   CBC: Recent Labs  Lab 09/09/17 1142 09/10/17 0421  WBC 7.6 5.3  NEUTROABS 5.1  --   HGB 16.3 12.2*  HCT 49.8 36.7*  MCV 88.0 88.2  PLT 255 834   Basic Metabolic Panel: Recent Labs  Lab  09/09/17 1048 09/09/17 1142 09/10/17 0421  NA 138 139 138  K 4.4 4.4 4.1  CL 103 101 105  CO2 27 25 25   GLUCOSE 81 82 121*  BUN 18 20 18   CREATININE 1.39* 1.30* 1.23  CALCIUM 9.4 9.5 8.6*  MG  --  2.2  --    GFR: Estimated Creatinine Clearance: 49.2 mL/min (by C-G formula based on SCr of 1.23 mg/dL). Liver Function Tests: Recent Labs  Lab 09/09/17 1142  AST 26  ALT 29  ALKPHOS 83  BILITOT 0.7  PROT 8.2*  ALBUMIN 4.3   No results for input(s): LIPASE,  AMYLASE in the last 168 hours. No results for input(s): AMMONIA in the last 168 hours. Coagulation Profile: Recent Labs  Lab 09/09/17 1048 09/10/17 0421 09/11/17 0252  INR 1.42 1.65 2.27   Cardiac Enzymes: No results for input(s): CKTOTAL, CKMB, CKMBINDEX, TROPONINI in the last 168 hours. BNP (last 3 results) No results for input(s): PROBNP in the last 8760 hours. HbA1C: No results for input(s): HGBA1C in the last 72 hours. CBG: No results for input(s): GLUCAP in the last 168 hours. Lipid Profile: No results for input(s): CHOL, HDL, LDLCALC, TRIG, CHOLHDL, LDLDIRECT in the last 72 hours. Thyroid Function Tests: No results for input(s): TSH, T4TOTAL, FREET4, T3FREE, THYROIDAB in the last 72 hours. Anemia Panel: No results for input(s): VITAMINB12, FOLATE, FERRITIN, TIBC, IRON, RETICCTPCT in the last 72 hours. Urine analysis:    Component Value Date/Time   COLORURINE YELLOW 09/22/2009 1640   APPEARANCEUR CLEAR 09/22/2009 1640   LABSPEC 1.011 09/22/2009 1640   PHURINE 5.5 09/22/2009 1640   GLUCOSEU NEGATIVE 09/22/2009 1640   HGBUR NEGATIVE 09/22/2009 1640   BILIRUBINUR NEGATIVE 09/22/2009 1640   KETONESUR NEGATIVE 09/22/2009 1640   PROTEINUR NEGATIVE 09/22/2009 1640   UROBILINOGEN 0.2 09/22/2009 1640   NITRITE NEGATIVE 09/22/2009 1640   LEUKOCYTESUR  09/22/2009 1640    NEGATIVE MICROSCOPIC NOT DONE ON URINES WITH NEGATIVE PROTEIN, BLOOD, LEUKOCYTES, NITRITE, OR GLUCOSE <1000 mg/dL.   Sepsis Labs: Invalid input(s): PROCALCITONIN, LACTICIDVEN  Recent Results (from the past 240 hour(s))  Culture, blood (routine x 2)     Status: None (Preliminary result)   Collection Time: 09/09/17 11:36 AM  Result Value Ref Range Status   Specimen Description BLOOD LEFT ANTECUBITAL  Final   Special Requests   Final    BOTTLES DRAWN AEROBIC AND ANAEROBIC Blood Culture adequate volume   Culture   Final    NO GROWTH 2 DAYS Performed at [facility], [address] [place],  [place] [address]    Report Status PENDING  Incomplete  Culture, blood (routine x 2)     Status: None (Preliminary result)   Collection Time: 09/09/17 11:44 AM  Result Value Ref Range Status   Specimen Description BLOOD RIGHT ANTECUBITAL  Final   Special Requests   Final    BOTTLES DRAWN AEROBIC AND ANAEROBIC Blood Culture adequate volume   Culture   Final    NO GROWTH 2 DAYS Performed at [facility], [place] [address], [place], [place] [address]    Report Status PENDING  Incomplete  Surgical PCR screen     Status: None   Collection Time: 09/09/17  2:33 PM  Result Value Ref Range Status   MRSA, PCR NEGATIVE NEGATIVE Final   Staphylococcus aureus NEGATIVE NEGATIVE Final    Comment: (NOTE) The Xpert SA Assay (FDA approved for NASAL specimens in patients 2 years of age and older), is one  component of a comprehensive surveillance program. It is not intended to diagnose infection nor to guide or monitor treatment. Performed at [facility], [place] [address], [place], [place] [address]   Aerobic/Anaerobic Culture (surgical/deep wound)     Status: None (Preliminary result)   Collection Time: 09/10/17  2:05 PM  Result Value Ref Range Status   Specimen Description ABSCESS RIGHT FOOT  Final   Special Requests NONE  Final   Gram Stain   Final    MODERATE WBC PRESENT,BOTH PMN AND MONONUCLEAR NO ORGANISMS SEEN Performed at [facility], [address] [place], [place] [address]    Culture PENDING  Incomplete   Report Status PENDING  Incomplete      Radiology Studies: No results found.   Scheduled Meds: . diltiazem  180 mg Oral Daily  . lisinopril  20 mg Oral Daily  . metoprolol tartrate  12.5 mg Oral BID  . mupirocin ointment  1 application Nasal BID  . pantoprazole  40 mg Oral Daily  . sodium chloride flush  3 mL Intravenous Q12H  . warfarin  4 mg Oral ONCE-1800  . Warfarin - Pharmacist Dosing Inpatient   Does not apply q1800   Continuous Infusions: . sodium  chloride Stopped (09/10/17 1238)  . cefTRIAXone (ROCEPHIN)  IV 1 g (09/11/17 1045)  . vancomycin 166.7 mL/hr at 09/10/17 Fenwick, MD, PhD [facility] Pager [phone]  If 7PM-7AM, please contact night-coverage [email or web address] Password [facility] 09/11/2017, 11:19 AM

## 2017-09-11 NOTE — Evaluation (Signed)
Physical Therapy Evaluation Patient Details Name: [name] MRN: [id number] DOB: 18-Feb-1931 Today's Date: 09/11/2017   History of Present Illness  Pt is an 84 y/o male s/p I&D R foot secondary to gout flare with subsequent cellulitis. PMH including but not limited to coronary artery disease status post left heart cath on 10/24/2015 status post balloon angioplasty of OM 2, stage III CKD, obstructive sleep apnea on CPAP, hypertension, hyperlipidemia, gout, chronic atrial fibrillation on warfarin.    Clinical Impression  Pt presented sitting OOB in recliner chair, awake and willing to participate in therapy session. Pt's daughter present throughout session as well. Prior to admission, pt reported that he was independent with all functional mobility and ADLs. Pt lives alone but has family that can provide 24/7 supervision/assistance if needed. Pt currently requires min-mod A for transfers with RW. Attempted to hop in place with RW while maintaining NWB R LE; however, pt very unsteady and unsafe therefore did not pursue further gait training. Pt would continue to benefit from skilled physical therapy services at this time while admitted and after d/c to address the below listed limitations in order to improve overall safety and independence with functional mobility.  Patient is s/p I&D R foot secondary to gout flare with cellulitis which impairs their ability to perform daily activities in the home. A walker alone will not resolve the issues with performing activities of daily living. A wheelchair will allow patient to safely perform daily activities. The patient can self propel in the home or has a caregiver who can provide assistance.       Follow Up Recommendations Home health PT;Supervision/Assistance - 24 hour;Other (comment)(HH aide)    Equipment Recommendations  Rolling walker with 5" wheels;3in1 (PT);Wheelchair (measurements PT);Wheelchair cushion (measurements PT);Other (comment)(w/c  with elevating leg rests)    Recommendations for Other Services       Precautions / Restrictions Precautions Precautions: Fall Restrictions Weight Bearing Restrictions: Yes RLE Weight Bearing: Non weight bearing      Mobility  Bed Mobility               General bed mobility comments: pt sitting OOB in recliner chair upon arrival  Transfers Overall transfer level: Needs assistance Equipment used: Rolling walker (2 wheeled) Transfers: Sit to/from Stand Sit to Stand: Mod assist;Min assist         General transfer comment: increased time and effort, cueing for safe hand placement and cueing for NWB R LE, mod A initially with first trial and min A with second trial   Ambulation/Gait             General Gait Details: attempted to take hops in place on L LE with RW; pt very unsteady and unsafe, having great difficulty maintaining NWB R LE during therefore did not pursue further gait training  Stairs            Wheelchair Mobility    Modified Rankin (Stroke Patients Only)       Balance Overall balance assessment: Needs assistance Sitting-balance support: No upper extremity supported Sitting balance-Leahy Scale: Fair     Standing balance support: During functional activity;Bilateral upper extremity supported Standing balance-Leahy Scale: Poor                               Pertinent Vitals/Pain Pain Assessment: Faces Faces Pain Scale: Hurts little more Pain Location: R foot Pain Descriptors / Indicators: Sore;Throbbing Pain Intervention(s): Monitored during session;Repositioned  Home Living Family/patient expects to be discharged to:: Private residence Living Arrangements: Alone Available Help at Discharge: Family;Available 24 hours/day Type of Home: House Home Access: Ramped entrance     Home Layout: One level Home Equipment: None      Prior Function Level of Independence: Independent         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 Pt having difficulty maintaining NWB R LE throughout functional mobility       Communication   Communication: No difficulties  Cognition Arousal/Alertness: Awake/alert Behavior During Therapy: WFL for tasks assessed/performed Overall Cognitive Status: Within Functional Limits for tasks assessed                                        General Comments      Exercises Other Exercises Other Exercises: chair push-ups x10 in recliner chair with cueing and supervision   Assessment/Plan    PT Assessment Patient needs continued PT services  PT Problem List Decreased balance;Decreased mobility;Decreased coordination;Decreased knowledge of use of DME;Decreased safety awareness;Decreased knowledge of precautions;Pain       PT Treatment Interventions DME instruction;Gait training;Stair training;Functional mobility training;Therapeutic activities;Therapeutic exercise;Balance training;Neuromuscular re-education;Patient/family education    PT Goals (Current goals can be found in the Care Plan section)  Acute Rehab PT Goals Patient Stated Goal: return home PT Goal Formulation: With patient/family Time For Goal Achievement: 09/25/17 Potential to Achieve Goals: Fair    Frequency Min 3X/week   Barriers to discharge        Co-evaluation               AM-PAC PT "6 Clicks" Daily Activity  Outcome Measure Difficulty turning over in bed (including adjusting bedclothes, sheets and blankets)?: A Little Difficulty moving from lying on back to sitting on the side of the bed? : A Little Difficulty  sitting down on and standing up from a chair with arms (e.g., wheelchair, bedside commode, etc,.)?: Unable Help needed moving to and from a bed to chair (including a wheelchair)?: A Little Help needed walking in hospital room?: A Lot Help needed climbing 3-5 steps with a railing? : Total 6 Click Score: 13    End of Session Equipment Utilized During Treatment: Gait belt Activity Tolerance: Patient tolerated treatment well Patient left: in chair;with call bell/phone within reach;with family/visitor present Nurse Communication: Mobility status PT Visit Diagnosis: Other abnormalities of gait and mobility (R26.89);Pain Pain - Right/Left: Right Pain - part of body: Ankle and joints of foot    Time: 1423-1444 PT Time Calculation (min) (ACUTE ONLY): 21 min   Charges:   PT Evaluation $PT Eval Moderate Complexity: 1 Mod     PT G Codes:        Blackwell, PT, DPT [place] 09/11/2017, 3:28 PM

## 2017-09-11 NOTE — Progress Notes (Signed)
ANTICOAGULATION CONSULT NOTE - Initial Consult  Pharmacy Consult for Coumadin Indication: atrial fibrillation  Allergies  Allergen Reactions  . Penicillins Shortness Of Breath and Swelling    PATIENT HAS HAD A PCN REACTION WITH IMMEDIATE RASH, FACIAL/TONGUE/THROAT SWELLING, SOB, OR LIGHTHEADEDNESS WITH HYPOTENSION:  #  #  YES  #  Has patient had a PCN reaction causing severe rash involving mucus membranes or skin necrosis: NO Has patient had a PCN reaction that required hospitalization NO Has patient had a PCN reaction occurring within the last 10 years: NO If all of the above answers are "NO", then may proceed with Cephalosporin use.  . Atorvastatin Other (See Comments)    No energy, just felt bad    . Pravastatin Other (See Comments)    myalgias  . Biaxin [Clarithromycin] Diarrhea  . Doxycycline Other (See Comments)    GI upset   Labs: Recent Labs    09/09/17 1048 09/09/17 1142 09/10/17 0421 09/11/17 0252  HGB  --  16.3 12.2*  --   HCT  --  49.8 36.7*  --   PLT  --  255 187  --   LABPROT 17.2*  --  19.4* 24.9*  INR 1.42  --  1.65 2.27  CREATININE 1.39* 1.30* 1.23  --     Estimated Creatinine Clearance: 49.2 mL/min (by C-G formula based on SCr of 1.23 mg/dL).   Medical History: Past Medical History:  Diagnosis Date  . Bradycardia    a. nocturnal with pauses overnight on tele likely due to OSA 10/2015.  [name] CKD (chronic kidney disease), stage III ([place])   . Coronary artery disease    a. prev nonobst. b. LHC 10/24/15: occlusion of small diffusely diseased OM2 s/p balloon angioplasty, mild nonobstructive disease of mLAD and mRCA, normal LVEDP  . Diverticulosis   . Esophageal reflux   . Esophageal stricture from GERD 06/08/2011   With stricture at GE junction on EGD, dilated 06/2011   . Gastric ulcer    on EGD 2013  . Gout, unspecified    "on daily RX" (09/09/2017)  . History of blood transfusion 02/1964   "when I had right little finger cut off"  . Hyperlipidemia   .  Hypertension   . Obesity, unspecified   . Orthopnea   . OSA on CPAP   . Paroxysmal nocturnal dyspnea   . Persistent atrial fibrillation ([place])   . Vertigo    Assessment: 84 yo on Coumadin 4mg  daily exc 6mg  on Sun/Thurs PTA for Afib. Dr. Sharol Given ok to continue anticoag even if he has to be taken to OR. INR on admit is low but now trending up to 2.27. Hgb 12.2, plts wnl.  Goal of Therapy:  INR 2-3 Monitor platelets by anticoagulation protocol: Yes   Plan:  Give Coumadin 4mg  PO x 1 Monitor daily INR, CBC, s/s of bleed  Janney Priego A. Levada Dy, PharmD, [place] Pager: [phone] Please utilize Amion for appropriate phone number to reach the unit pharmacist ([place])   09/11/2017,10:34 AM

## 2017-09-12 LAB — CBC
HEMATOCRIT: 43.6 % (ref 39.0–52.0)
Hemoglobin: 14.3 g/dL (ref 13.0–17.0)
MCH: 29.4 pg (ref 26.0–34.0)
MCHC: 32.8 g/dL (ref 30.0–36.0)
MCV: 89.7 fL (ref 78.0–100.0)
Platelets: 186 10*3/uL (ref 150–400)
RBC: 4.86 MIL/uL (ref 4.22–5.81)
RDW: 14 % (ref 11.5–15.5)
WBC: 8.5 10*3/uL (ref 4.0–10.5)

## 2017-09-12 LAB — BASIC METABOLIC PANEL
Anion gap: 8 (ref 5–15)
BUN: 14 mg/dL (ref 8–23)
CO2: 26 mmol/L (ref 22–32)
CREATININE: 1.31 mg/dL — AB (ref 0.61–1.24)
Calcium: 8.9 mg/dL (ref 8.9–10.3)
Chloride: 102 mmol/L (ref 98–111)
GFR calc non Af Amer: 48 mL/min — ABNORMAL LOW (ref >60)
GFR, EST AFRICAN AMERICAN: 56 mL/min — AB (ref >60)
GLUCOSE: 133 mg/dL — AB (ref 70–99)
Potassium: 4.3 mmol/L (ref 3.5–5.1)
Sodium: 136 mmol/L (ref 135–145)

## 2017-09-12 LAB — GLUCOSE, CAPILLARY
Glucose-Capillary: 149 mg/dL — ABNORMAL HIGH (ref 70–99)
Glucose-Capillary: 147 mg/dL — ABNORMAL HIGH (ref 70–99)
Glucose-Capillary: 128 mg/dL — ABNORMAL HIGH (ref 70–99)
Glucose-Capillary: 152 mg/dL — ABNORMAL HIGH (ref 70–99)

## 2017-09-12 LAB — PROTIME-INR
INR: 1.94
Prothrombin Time: 22 seconds — ABNORMAL HIGH (ref 11.4–15.2)

## 2017-09-12 LAB — VANCOMYCIN, TROUGH: VANCOMYCIN TR: 10 ug/mL — AB (ref 15–20)

## 2017-09-12 MED ORDER — WARFARIN SODIUM 6 MG PO TABS
6.0000 mg | ORAL_TABLET | Freq: Once | ORAL | Status: AC
  Administered 2017-09-12: 6 mg via ORAL
  Filled 2017-09-12: qty 1

## 2017-09-12 MED ORDER — VANCOMYCIN HCL 10 G IV SOLR
1500.0000 mg | INTRAVENOUS | Status: DC
  Filled 2017-09-12: qty 1500

## 2017-09-12 MED ORDER — FUROSEMIDE 20 MG PO TABS: 20.0000 mg | ORAL_TABLET | Freq: Every day | ORAL | Status: DC

## 2017-09-12 MED ORDER — FUROSEMIDE 20 MG PO TABS
20.0000 mg | ORAL_TABLET | Freq: Every day | ORAL | Status: DC
  Administered 2017-09-12 – 2017-09-13 (×2): 20 mg via ORAL
  Filled 2017-09-12 (×2): qty 1

## 2017-09-12 MED ORDER — ORAL CARE MOUTH RINSE
15.0000 mL | Freq: Two times a day (BID) | OROMUCOSAL | Status: DC
  Administered 2017-09-12 – 2017-09-13 (×3): 15 mL via OROMUCOSAL

## 2017-09-12 MED ORDER — SENNOSIDES-DOCUSATE SODIUM 8.6-50 MG PO TABS
1.0000 | ORAL_TABLET | Freq: Two times a day (BID) | ORAL | Status: DC
  Administered 2017-09-12 – 2017-09-13 (×3): 1 via ORAL
  Filled 2017-09-12 (×3): qty 1

## 2017-09-12 NOTE — Progress Notes (Signed)
Patient has home CPAP and places on himself without assistance.

## 2017-09-12 NOTE — Progress Notes (Signed)
Pharmacy Antibiotic Note  [name] is a 84 y.o. male admitted on 09/09/2017 with cellulitis.  Pharmacy has been consulted for vancomycin dosing.  Started on vancomycin for toe infection / cellulitis. Also seems to have gout flare. Hx of CKD. SCr near baseline at 1.3, CrCl ~40-50 ml/min. S/P I&D on 6/28. Cultures NGTD   Vanc trough at 23.5 hours was 10 mcg/ml which is on the very lowest end of the desired range (10-15 mcg/ml). Will increase dose slightly.   Plan: Increase vancomycin to 1500mg  IV q24h with next dose.  Monitor clinical picture, renal function, VT prn F/U ortho recs, abx deescalation / LOT Height: 5\' 6"  (167.6 cm) Weight: 218 lb (98.9 kg) IBW/kg (Calculated) : 63.8  Temp (24hrs), Avg:99 F (37.2 C), Min:98 F (36.7 C), Max:100 F (37.8 C)  Recent Labs  Lab 09/09/17 1048 09/09/17 1142 09/10/17 0421 09/12/17 0606  WBC  --  7.6 5.3 8.5  CREATININE 1.39* 1.30* 1.23 1.31*    Estimated Creatinine Clearance: 46.2 mL/min (A) (by C-G formula based on SCr of 1.31 mg/dL (H)).    Allergies  Allergen Reactions  . Penicillins Shortness Of Breath and Swelling    PATIENT HAS HAD A PCN REACTION WITH IMMEDIATE RASH, FACIAL/TONGUE/THROAT SWELLING, SOB, OR LIGHTHEADEDNESS WITH HYPOTENSION:  #  #  YES  #  Has patient had a PCN reaction causing severe rash involving mucus membranes or skin necrosis: NO Has patient had a PCN reaction that required hospitalization NO Has patient had a PCN reaction occurring within the last 10 years: NO If all of the above answers are "NO", then may proceed with Cephalosporin use.  . Atorvastatin Other (See Comments)    No energy, just felt bad    . Pravastatin Other (See Comments)    myalgias  . Biaxin [Clarithromycin] Diarrhea  . Doxycycline Other (See Comments)    GI upset    Thank you for allowing pharmacy to be a part of this patient's care.  Marrissa Dai A. Levada Dy, PharmD, [place] Pager: [phone] Please  utilize Amion for appropriate phone number to reach the unit pharmacist ([place])   09/12/2017 10:17 AM

## 2017-09-12 NOTE — Progress Notes (Signed)
ANTICOAGULATION CONSULT NOTE - Initial Consult  Pharmacy Consult for Coumadin Indication: atrial fibrillation  Allergies  Allergen Reactions  . Penicillins Shortness Of Breath and Swelling    PATIENT HAS HAD A PCN REACTION WITH IMMEDIATE RASH, FACIAL/TONGUE/THROAT SWELLING, SOB, OR LIGHTHEADEDNESS WITH HYPOTENSION:  #  #  YES  #  Has patient had a PCN reaction causing severe rash involving mucus membranes or skin necrosis: NO Has patient had a PCN reaction that required hospitalization NO Has patient had a PCN reaction occurring within the last 10 years: NO If all of the above answers are "NO", then may proceed with Cephalosporin use.  . Atorvastatin Other (See Comments)    No energy, just felt bad    . Pravastatin Other (See Comments)    myalgias  . Biaxin [Clarithromycin] Diarrhea  . Doxycycline Other (See Comments)    GI upset   Labs: Recent Labs    09/09/17 1142 09/10/17 0421 09/11/17 0252 09/12/17 0606  HGB 16.3 12.2*  --  14.3  HCT 49.8 36.7*  --  43.6  PLT 255 187  --  186  LABPROT  --  19.4* 24.9* 22.0*  INR  --  1.65 2.27 1.94  CREATININE 1.30* 1.23  --  1.31*    Estimated Creatinine Clearance: 46.2 mL/min (A) (by C-G formula based on SCr of 1.31 mg/dL (H)).   Medical History: Past Medical History:  Diagnosis Date  . Bradycardia    a. nocturnal with pauses overnight on tele likely due to OSA 10/2015.  [name] CKD (chronic kidney disease), stage III ([place])   . Coronary artery disease    a. prev nonobst. b. LHC 10/24/15: occlusion of small diffusely diseased OM2 s/p balloon angioplasty, mild nonobstructive disease of mLAD and mRCA, normal LVEDP  . Diverticulosis   . Esophageal reflux   . Esophageal stricture from GERD 06/08/2011   With stricture at GE junction on EGD, dilated 06/2011   . Gastric ulcer    on EGD 2013  . Gout, unspecified    "on daily RX" (09/09/2017)  . History of blood transfusion 02/1964   "when I had right little finger cut off"  .  Hyperlipidemia   . Hypertension   . Obesity, unspecified   . Orthopnea   . OSA on CPAP   . Paroxysmal nocturnal dyspnea   . Persistent atrial fibrillation ([place])   . Vertigo    Assessment: 84 yo on Coumadin 4mg  daily exc 6mg  on Sun/Thurs PTA for Afib. Dr. Sharol Given ok to continue anticoag even if he has to be taken to OR. INR on admit was low.  Trending down to 1.94 from yesterday. Hgb 14.3, plts wnl. No bleeding noted.   Goal of Therapy:  INR 2-3 Monitor platelets by anticoagulation protocol: Yes   Plan:  Give Coumadin 6mg  PO x 1 Monitor daily INR, CBC, s/s of bleed  Danette Weinfeld A. Levada Dy, PharmD, [place] Pager: [phone] Please utilize Amion for appropriate phone number to reach the unit pharmacist ([place])   09/12/2017,10:14 AM

## 2017-09-12 NOTE — Discharge Instructions (Signed)

## 2017-09-12 NOTE — Progress Notes (Signed)
PROGRESS NOTE  KENTRAIL SHEW WGN:[id number] DOB: Apr 02, 1930 DOA: 09/09/2017 PCP: Tonia Ghent, MD   LOS: 3 days   Brief Narrative / Interim history: 84 year old male with history of CAD, stage III chronic kidney disease, OSA on CPAP, hypertension, hyperlipidemia, gout, chronic A. fib on Coumadin who was admitted to the hospital on 6/27 with concern for infected right foot/cellulitis.  He states that over the last few weeks he has been having a gout flare which has not been resolving and in fact is gotten worse with worsening redness and swelling.  He was given doxycycline x1 and Ceftin ear as an outpatient about 4 days ago, but despite that he has progressed.  He was seen by Dr. Sharol Given on 6/27 and was directed for admission for IV antibiotics.  He is status post I&D on 6/28  Assessment & Plan: Active Problems:   Gout   OBESITY   GASTROESOPHAGEAL REFLUX DISEASE   OSA on CPAP   Chronic anticoagulation   Persistent atrial fibrillation (HCC)   Hypertension   Hyperlipidemia   CAD S/P percutaneous coronary angioplasty   Septic arthritis (HCC)   Cutaneous abscess of right foot   Right hallux cellulitis superimposed on gout flare -Orthopedic surgery Dr. Sharol Given consulted, patient was taken to the operating room on 6/28 and status post right foot debridement, partial excision of the first metatarsal head, excision of the capsule and debridement of the abscess and gout.  Cultures were sent, no growth today -Continue Vanc - ceftriaxone until cultures are back. -Dr. Sharol Given to reevaluate probably tomorrow  Hypertension -Continue lisinopril, diltiazem, metoprolol, blood pressure normal, resume Lasix as below  Chronic A. fib -Continue rate control with diltiazem and metoprolol, anticoagulated with Coumadin  -Continue Coumadin per pharmacy  Coronary artery disease -No chest pain, had a cardiac cath in 2017 status post balloon angioplasty   Chronic diastolic CHF -Appears euvolemic, resume  Lasix  Chronic kidney disease stage III -Creatinine overall stable at his baseline   DVT prophylaxis: Coumadin Code Status: Full code Family Communication: daughter present at bedside  Disposition Plan: TBD post op  Consultants:   Orthopedic surgery   Procedures:   None   Antimicrobials:  Vancomycin 6/27 >>  Ceftriaxone 6/27 >>  Clindamycin - periop   Subjective: - no chest pain, shortness of breath, no abdominal pain, nausea or vomiting.   Objective: Vitals:   09/11/17 1416 09/12/17 0000 09/12/17 0016 09/12/17 0441  BP: 104/67   128/77  Pulse: 91   92  Resp: 16 18 18 18   Temp: 100 F (37.8 C)   98 F (36.7 C)  TempSrc: Oral   Oral  SpO2: 97%   92%  Weight:   98.9 kg (218 lb)   Height:   5\' 6"  (1.676 m)     Intake/Output Summary (Last 24 hours) at 09/12/2017 1153 Last data filed at 09/12/2017 0900 Gross per 24 hour  Intake 1400 ml  Output -  Net 1400 ml   Filed Weights   09/12/17 0016  Weight: 98.9 kg (218 lb)    Examination:  Constitutional: No apparent distress Respiratory: Clear to auscultation bilaterally without wheezing or crackles, no accessory muscle use Cardiovascular: Irregular, no murmurs heard, no peripheral edema Abdomen: Soft, nontender nondistended, positive bowel sounds Skin: Ace wrap on right foot Neurologic: Nonfocal, equal strength    Data Reviewed: I have independently reviewed following labs and imaging studies   CBC: Recent Labs  Lab 09/09/17 1142 09/10/17 0421 09/12/17 0606  WBC 7.6  5.3 8.5  NEUTROABS 5.1  --   --   HGB 16.3 12.2* 14.3  HCT 49.8 36.7* 43.6  MCV 88.0 88.2 89.7  PLT 255 187 373   Basic Metabolic Panel: Recent Labs  Lab 09/09/17 1048 09/09/17 1142 09/10/17 0421 09/12/17 0606  NA 138 139 138 136  K 4.4 4.4 4.1 4.3  CL 103 101 105 102  CO2 27 25 25 26   GLUCOSE 81 82 121* 133*  BUN 18 20 18 14   CREATININE 1.39* 1.30* 1.23 1.31*  CALCIUM 9.4 9.5 8.6* 8.9  MG  --  2.2  --   --     GFR: Estimated Creatinine Clearance: 46.2 mL/min (A) (by C-G formula based on SCr of 1.31 mg/dL (H)). Liver Function Tests: Recent Labs  Lab 09/09/17 1142  AST 26  ALT 29  ALKPHOS 83  BILITOT 0.7  PROT 8.2*  ALBUMIN 4.3   No results for input(s): LIPASE, AMYLASE in the last 168 hours. No results for input(s): AMMONIA in the last 168 hours. Coagulation Profile: Recent Labs  Lab 09/09/17 1048 09/10/17 0421 09/11/17 0252 09/12/17 0606  INR 1.42 1.65 2.27 1.94   Cardiac Enzymes: No results for input(s): CKTOTAL, CKMB, CKMBINDEX, TROPONINI in the last 168 hours. BNP (last 3 results) No results for input(s): PROBNP in the last 8760 hours. HbA1C: No results for input(s): HGBA1C in the last 72 hours. CBG: Recent Labs  Lab 09/11/17 2041 09/12/17 0616 09/12/17 1116  GLUCAP 136* 149* 147*   Lipid Profile: No results for input(s): CHOL, HDL, LDLCALC, TRIG, CHOLHDL, LDLDIRECT in the last 72 hours. Thyroid Function Tests: No results for input(s): TSH, T4TOTAL, FREET4, T3FREE, THYROIDAB in the last 72 hours. Anemia Panel: No results for input(s): VITAMINB12, FOLATE, FERRITIN, TIBC, IRON, RETICCTPCT in the last 72 hours. Urine analysis:    Component Value Date/Time   COLORURINE YELLOW 09/22/2009 1640   APPEARANCEUR CLEAR 09/22/2009 1640   LABSPEC 1.011 09/22/2009 1640   PHURINE 5.5 09/22/2009 1640   GLUCOSEU NEGATIVE 09/22/2009 1640   HGBUR NEGATIVE 09/22/2009 1640   BILIRUBINUR NEGATIVE 09/22/2009 1640   KETONESUR NEGATIVE 09/22/2009 1640   PROTEINUR NEGATIVE 09/22/2009 1640   UROBILINOGEN 0.2 09/22/2009 1640   NITRITE NEGATIVE 09/22/2009 1640   LEUKOCYTESUR  09/22/2009 1640    NEGATIVE MICROSCOPIC NOT DONE ON URINES WITH NEGATIVE PROTEIN, BLOOD, LEUKOCYTES, NITRITE, OR GLUCOSE <1000 mg/dL.   Sepsis Labs: Invalid input(s): PROCALCITONIN, LACTICIDVEN  Recent Results (from the past 240 hour(s))  Culture, blood (routine x 2)     Status: None (Preliminary result)    Collection Time: 09/09/17 11:36 AM  Result Value Ref Range Status   Specimen Description BLOOD LEFT ANTECUBITAL  Final   Special Requests   Final    BOTTLES DRAWN AEROBIC AND ANAEROBIC Blood Culture adequate volume   Culture   Final    NO GROWTH 2 DAYS Performed at [facility], [address], [place], [place] [address]    Report Status PENDING  Incomplete  Culture, blood (routine x 2)     Status: None (Preliminary result)   Collection Time: 09/09/17 11:44 AM  Result Value Ref Range Status   Specimen Description BLOOD RIGHT ANTECUBITAL  Final   Special Requests   Final    BOTTLES DRAWN AEROBIC AND ANAEROBIC Blood Culture adequate volume   Culture   Final    NO GROWTH 2 DAYS Performed at [facility], [place] [address] [place], [place] [address]    Report Status  PENDING  Incomplete  Surgical PCR screen     Status: None   Collection Time: 09/09/17  2:33 PM  Result Value Ref Range Status   MRSA, PCR NEGATIVE NEGATIVE Final   Staphylococcus aureus NEGATIVE NEGATIVE Final    Comment: (NOTE) The Xpert SA Assay (FDA approved for NASAL specimens in patients 82 years of age and older), is one component of a comprehensive surveillance program. It is not intended to diagnose infection nor to guide or monitor treatment. Performed at [facility], [place] [address] [place], [place] [address]   Aerobic/Anaerobic Culture (surgical/deep wound)     Status: None (Preliminary result)   Collection Time: 09/10/17  2:05 PM  Result Value Ref Range Status   Specimen Description ABSCESS RIGHT FOOT  Final   Special Requests NONE  Final   Gram Stain   Final    MODERATE WBC PRESENT,BOTH PMN AND MONONUCLEAR NO ORGANISMS SEEN    Culture   Final    NO GROWTH 2 DAYS NO ANAEROBES ISOLATED; CULTURE IN PROGRESS FOR 5 DAYS Performed at [facility], [place] [address] [place], [place] [address]    Report Status PENDING  Incomplete      Radiology Studies: No results  found.   Scheduled Meds: . diltiazem  180 mg Oral Daily  . lisinopril  20 mg Oral Daily  . mouth rinse  15 mL Mouth Rinse BID  . metoprolol tartrate  12.5 mg Oral BID  . pantoprazole  40 mg Oral Daily  . senna-docusate  1 tablet Oral BID  . sodium chloride flush  3 mL Intravenous Q12H  . warfarin  6 mg Oral ONCE-1800  . Warfarin - Pharmacist Dosing Inpatient   Does not apply q1800   Continuous Infusions: . sodium chloride 10 mL/hr at 09/11/17 1930  . cefTRIAXone (ROCEPHIN)  IV 1 g (09/12/17 1034)  . vancomycin Stopped (09/11/17 1515)     Marzetta Board, MD, PhD [facility] Pager [phone]  If 7PM-7AM, please contact night-coverage [email or web address] Password [facility] 09/12/2017, 11:53 AM

## 2017-09-13 LAB — PROTIME-INR
INR: 1.81
Prothrombin Time: 20.9 s — ABNORMAL HIGH (ref 11.4–15.2)

## 2017-09-13 LAB — GLUCOSE, CAPILLARY
Glucose-Capillary: 130 mg/dL — ABNORMAL HIGH (ref 70–99)
Glucose-Capillary: 116 mg/dL — ABNORMAL HIGH (ref 70–99)

## 2017-09-13 MED ORDER — SULFAMETHOXAZOLE-TRIMETHOPRIM 800-160 MG PO TABS: 1.0000 | ORAL_TABLET | Freq: Two times a day (BID) | ORAL | 0 refills | Status: DC

## 2017-09-13 MED ORDER — FEBUXOSTAT 80 MG PO TABS: 80.0000 mg | ORAL_TABLET | Freq: Every day | ORAL | 3 refills | Status: DC

## 2017-09-13 MED ORDER — FEBUXOSTAT 40 MG PO TABS
80.0000 mg | ORAL_TABLET | Freq: Every day | ORAL | Status: DC
  Administered 2017-09-13: 80 mg via ORAL
  Filled 2017-09-13: qty 2

## 2017-09-13 MED ORDER — WARFARIN SODIUM 6 MG PO TABS
6.0000 mg | ORAL_TABLET | Freq: Once | ORAL | Status: DC
  Filled 2017-09-13: qty 1

## 2017-09-13 NOTE — Progress Notes (Signed)
..      Durable Medical Equipment  (From admission, onward)        Start     Ordered   09/13/17 1108  For home use only DME Hospital bed  Once    Question Answer Comment  Patient has (list medical condition): CHF, right foot cellulitis, OSA   The above medical condition requires: Patient requires the ability to reposition frequently   Head must be elevated greater than: 30 degrees   Bed type Semi-electric   Trapeze Bar Yes   Support Surface: Gel Overlay      09/13/17 1108   09/13/17 1051  For home use only DME 3 n 1  Once     09/13/17 1051   09/13/17 1048  For home use only DME lightweight manual wheelchair with seat cushion  Once    Comments:  Patient suffers from right hallux cellulitis right leg which impairs their ability to perform daily activities like standing, walking in the home.  A rolling walker, cane will not resolve  issue with performing activities of daily living. A wheelchair will allow patient to safely perform daily activities. Patient is not able to propel themselves in the home using a standard weight wheelchair due to weakness, pain. Patient can self propel in the lightweight wheelchair.  Accessories: elevating leg rests (ELRs), wheel locks, extensions and anti-tippers.   09/13/17 1049

## 2017-09-13 NOTE — Progress Notes (Signed)
Received call from dtr stating [name] $400 and insurance is not covering. And [facility] reviewed risk of bleeding with Coumadin and Bactrim. NCM contacted and spoke to Dr. Sharol Given. NCM updated dtr that office will follow up on paperwork sent from [facility] to complete Medical Necessity paperwork. And that INR will be monitored while on Bactrim and Coumadin. Faxed goodrx coupon to [facility] for 7 days for $85. Dtr will pay out of pocket for the 7 days. Encouraged her to follow up with Dr Jess Barters office on [name].  Jonnie Finner RN CCM Case Mgmt phone [phone]

## 2017-09-13 NOTE — Care Management Note (Addendum)
Case Management Note  Patient Details  Name: [name] MRN: [id number] Date of Birth: Aug 30, 1930  Subjective/Objective:  Right foot cellulitis, gout                 Action/Plan: NCM spoke to pt and dtr, Tabitha Pickle # [phone] at bedside. Offered choice for HH/list provided. Dtr agreeable to [facility]. Pt declines SNF. Contacted [facility] rep with new referral. Contacted [facility] to arrange DME. [facility] arranged.  Added drop arm bedside commode, overbed table, and sliding board. Notified [facility]. Notified [facility] of INR draw and OT added.   Expected Discharge Date:  09/13/17               Expected Discharge Plan:  [place]  In-House Referral:  NA  Discharge planning Services  CM Consult  Post Acute Care Choice:  Home Health Choice offered to:  Adult Children  DME Arranged:  3-N-1, Hospital bed, Lightweight manual wheelchair with seat cushion DME Agency:  [facility] Arranged:  RN, PT, Nurse's Aide [place] Agency:  [facility]  Status of Service:  Completed, signed off  If discussed at [place] of Stay Meetings, dates discussed:    Additional Comments:  Erenest Rasher, RN 09/13/2017, 11:04 AM

## 2017-09-13 NOTE — Progress Notes (Signed)
Occupational Therapy Evaluation Patient Details Name: [name] MRN: [id number] DOB: [birth year] Today's Date: 09/13/2017    History of Present Illness Pt is an 84 y/o male s/p I&D R foot secondary to gout flare with subsequent cellulitis. PMH including but not limited to coronary artery disease status post left heart cath on 10/24/2015 status post balloon angioplasty of OM 2, stage III CKD, obstructive sleep apnea on CPAP, hypertension, hyperlipidemia, gout, chronic atrial fibrillation on warfarin.   Clinical Impression   PTA patient was independent with ADL, IADL (working) and mobility.  He currently requires supervision of UB ADL, max assist for LB ADL, and min assist for slideboard transfers.  He is limited by NWB RLE, 'stiffness' in L knee and 'gout flare' in L elbow per patient report.  He has been educated on self care techniques and compensatory strategies, precautions, safety, DME, and assistance level at home.  Patient agreeable with recommendations of 24/7 assistance and follow up [place].  He will need 3:1 drop arm BSC at discharge in order to safety complete slideboard transfers, he is not planning on completing showers at this time.  His family and aide assistance will be able to provide the care he needs at home.  OT signing OFF.      Follow Up Recommendations  Home health OT;Supervision/Assistance - 24 hour ; HHAide    Equipment Recommendations  3 in 1 bedside commode(drop arm commode) . slideboard    Recommendations for Other Services       Precautions / Restrictions Precautions Precautions: Fall Restrictions Weight Bearing Restrictions: Yes RLE Weight Bearing: Non weight bearing      Mobility Bed Mobility Overal bed mobility: Needs Assistance Bed Mobility: Sit to Supine       Sit to supine: Supervision   General bed mobility comments: pt sitting OOB in recliner chair upon arrival  Transfers Overall transfer level: Needs assistance Equipment used: Sliding  board Transfers: Lateral/Scoot Transfers Sit to Stand: Min assist        Lateral/Scoot Transfers: With slide board;Min assist General transfer comment: from recliner to EOB, EOB to/from drop arm 3:1 with min assist, cueing for hand placement, positioning and safety; reviewed setup at home     Balance Overall balance assessment: Needs assistance Sitting-balance support: No upper extremity supported Sitting balance-Leahy Scale: Good       Standing balance-Leahy Scale: Poor                             ADL either performed or assessed with clinical judgement   ADL Overall ADL's : Needs assistance/impaired Eating/Feeding: Set up;Sitting   Grooming: Set up;Sitting   Upper Body Bathing: Minimal assistance;Sitting   Lower Body Bathing: Maximal assistance;Sitting/lateral leans Lower Body Bathing Details (indicate cue type and reason): educated on lateral leans for bathing on 3:1, patient and daughter report he will have the assistance he needs  Upper Body Dressing : Set up;Sitting   Lower Body Dressing: Maximal assistance;Sitting/lateral leans Lower Body Dressing Details (indicate cue type and reason): educated on compensatory technqiues, pt and family report he will have the support he needs (unable to reach feet at this time) Toilet Transfer: Minimal assistance;Cueing for safety;Cueing for sequencing;Transfer board;Requires drop arm;BSC Toilet Transfer Details (indicate cue type and reason): slideboard transfer, edu on placement, setup and safety  Toileting- Clothing Manipulation and Hygiene: Minimal assistance;Sitting/lateral lean Toileting - Clothing Manipulation Details (indicate cue type and reason): educated on compensatory techniques, anticipate will  need help with clothing mgmt up; educated on urinal use for energy conservation   Tub/Shower Transfer Details (indicate cue type and reason): HHOT to follow up, pt reports not planning to shower until R LE  heals Functional mobility during ADLs: Minimal assistance(limited to slide board transfers) General ADL Comments: educated on compensatory techniques for ADL participation, limited at this time due to NWB R LE, stiffness in L knee and gout flare of L elbow; patient and family report he will have assistance at [place] [facility]/History: Wears glasses Wears Glasses: At all times Patient Visual Report: No change from baseline Vision Assessment?: No apparent visual deficits     Perception     Praxis      Pertinent Vitals/Pain Pain Assessment: Faces Pain Score: 6  Faces Pain Scale: Hurts little more Pain Location: R foot Pain Descriptors / Indicators: Sore;Throbbing Pain Intervention(s): Monitored during session;Repositioned     Hand Dominance     Extremity/Trunk Assessment Upper Extremity Assessment Upper Extremity Assessment: Overall WFL for tasks assessed;LUE deficits/detail LUE Deficits / Details: limited elbow extension due to gout flare LUE: Unable to fully assess due to pain LUE Coordination: decreased gross motor   Lower Extremity Assessment Lower Extremity Assessment: RLE deficits/detail RLE Deficits / Details: NWB R LE s/p I&D       Communication Communication Communication: No difficulties   Cognition Arousal/Alertness: Awake/alert Behavior During Therapy: WFL for tasks assessed/performed Overall Cognitive Status: Within Functional Limits for tasks assessed                                     General Comments  daughter present and supportive    Exercises     Shoulder Instructions      Home Living Family/patient expects to be discharged to:: Private residence Living Arrangements: Alone Available Help at Discharge: Family;Available 24 hours/day Type of Home: House Home Access: Ramped entrance     Home Layout: One level     Bathroom Shower/Tub: Occupational psychologist: Standard Bathroom Accessibility:  Yes How Accessible: Accessible via wheelchair Home Equipment: None          Prior Functioning/Environment Level of Independence: Independent        Comments: Indepedent ADL, IADL (working as Administrator), mobility        OT Problem List: Decreased activity tolerance;Impaired balance (sitting and/or standing);Decreased knowledge of use of DME or AE;Pain      OT Treatment/Interventions:      OT Goals(Current goals can be found in the care plan section) Acute Rehab OT Goals Patient Stated Goal: return home OT Goal Formulation: With patient  OT Frequency:     Barriers to D/C:            Co-evaluation              AM-PAC PT "6 Clicks" Daily Activity     Outcome Measure Help from another person eating meals?: None Help from another person taking care of personal grooming?: A Little Help from another person toileting, which includes using toliet, bedpan, or urinal?: A Little Help from another person bathing (including washing, rinsing, drying)?: A Little Help from another person to put on and taking off regular upper body clothing?: A Little Help from another person to put on and taking off regular lower body clothing?: A Lot 6 Click Score: 18   End of Session  Equipment Utilized During Treatment: Gait belt(3:1 drop arm commode, slideboard) Nurse Communication: Mobility status  Activity Tolerance: Patient tolerated treatment well Patient left: in bed;with family/visitor present;with call bell/phone within reach  OT Visit Diagnosis: Other abnormalities of gait and mobility (R26.89)                Time: 9311-2162 OT Time Calculation (min): 27 min Charges:  OT General Charges $OT Visit: 1 Visit OT Evaluation $OT Eval Moderate Complexity: 1 Mod OT Treatments $Self Care/Home Management : 8-22 mins G-Codes:     Delight Stare, OTR/L  Pager [place] 09/13/2017, 1:48 PM

## 2017-09-13 NOTE — Progress Notes (Signed)
..      Durable Medical Equipment  (From admission, onward)        Start     Ordered   09/13/17 1048  For home use only DME lightweight manual wheelchair with seat cushion  Once    Comments:  Patient suffers from right hallux cellulitis right leg which impairs their ability to perform daily activities like standing, walking in the home.  A rolling walker, cane will not resolve  issue with performing activities of daily living. A wheelchair will allow patient to safely perform daily activities. Patient is not able to propel themselves in the home using a standard weight wheelchair due to weakness, pain. Patient can self propel in the lightweight wheelchair.  Accessories: elevating leg rests (ELRs), wheel locks, extensions and anti-tippers.   09/13/17 1049   09/13/17 1007  For home use only DME Hospital bed  Once    Question Answer Comment  The above medical condition requires: Patient requires the ability to reposition frequently   Bed type Semi-electric      09/13/17 1006

## 2017-09-13 NOTE — Care Management Important Message (Signed)
Important Message  Patient Details  Name: [name] MRN: [id number] Date of Birth: 05/18/1930   Medicare Important Message Given:  Yes    Erenest Rasher, RN 09/13/2017, 12:26 PM

## 2017-09-13 NOTE — Progress Notes (Addendum)
Patient ID: [name], male   DOB: 07-12-1930, 84 y.o.   MRN: [id number] Patient states he has no pain in his foot.  Will have the dressing changed and patient may be discharged on sulfamethoxazole trimethoprim.  This may affect his Coumadin.  Patient states he started to develop gout symptoms in his left elbow he states he has had gout in the elbow before.  I will change him from colchicine to Uloric for his gout medication.  The colchicine does not seem to be working for him.  I will follow-up in the office in 1 week.  Patient could be discharged today from an orthopedic standpoint.

## 2017-09-13 NOTE — Progress Notes (Signed)
[place] for warfarin Indication: atrial fibrillation  Allergies  Allergen Reactions  . Penicillins Shortness Of Breath and Swelling    PATIENT HAS HAD A PCN REACTION WITH IMMEDIATE RASH, FACIAL/TONGUE/THROAT SWELLING, SOB, OR LIGHTHEADEDNESS WITH HYPOTENSION:  #  #  YES  #  Has patient had a PCN reaction causing severe rash involving mucus membranes or skin necrosis: NO Has patient had a PCN reaction that required hospitalization NO Has patient had a PCN reaction occurring within the last 10 years: NO If all of the above answers are "NO", then may proceed with Cephalosporin use.  . Atorvastatin Other (See Comments)    No energy, just felt bad    . Pravastatin Other (See Comments)    myalgias  . Biaxin [Clarithromycin] Diarrhea  . Doxycycline Other (See Comments)    GI upset   Labs: Recent Labs    09/11/17 0252 09/12/17 0606 09/13/17 0411  HGB  --  14.3  --   HCT  --  43.6  --   PLT  --  186  --   LABPROT 24.9* 22.0* 20.9*  INR 2.27 1.94 1.81  CREATININE  --  1.31*  --     Estimated Creatinine Clearance: 46.2 mL/min (A) (by C-G formula based on SCr of 1.31 mg/dL (H)).   Assessment: 84 yo on warfarin PTA- home dose 4mg  daily except 6mg  on Sun/Thurs for Afib. Dr. Sharol Given ok to continue anticoagulation despite going to OR.  INR has been trending down- 1.81 today.   Patient is currently on vancomycin + ceftriaxone, but noted he may be discharging on Bactrim per Dr. Jess Barters note this morning.  Goal of Therapy:  INR 2-3 Monitor platelets by anticoagulation protocol: Yes   Plan:  Warfarin 6mg  po x1 tonight If patient discharges today on Bactrim, recommend reducing warfarin dose to 4mg  daily and checking an INR Wed 7/3 or Fri 7/5  Daily INR while admitted; follow for s/s bleeding   [name], PharmD, BCPS Clinical Pharmacist [phone] Please check AMION for all [place] numbers 09/13/2017 8:08 AM

## 2017-09-13 NOTE — Consult Note (Signed)
[facility] CM Primary Care Navigator  09/13/2017  [name] 1930-06-18 [id number]   Met withpatient and daughter [name]) at the bedside toidentify possible discharge needs. Patient's daughterreportsthat he had gout flare up to his right foot with subsequent infection/ abscess whichhad ledto thisadmission/ surgery. (Right hallux cellulitis superimposed on gout flare, status post right foot debridement)  Daughter endorses Dr.Graham Damita Dunnings with [facility] ashisprimary care provider.   [facility] [address] [employer] [facility] so [facility].  Daughters ([name] and [name]) have been managing hismedications at home using "pill box" system filled weekly.  Patient has been driving prior to admission but daughters will be providing transportation to hisdoctors' appointmentsafter discharge.  Both daughters will be his primary caregivers at home.   Anticipated plan for dischargeis home with home health services per daughter. Referral was [facility] to [facility] per Inpatient CM note.  Patient's daughtervoiced understandingto callprimarycare provider'soffice whenhereturnshome,for a post discharge follow-upvisitwithin1- 2 weeksor sooner if needs arise.Patient letter (with PCP's contact number) was provided asareminder.   Discussed with patient's daughterregarding [facility] CM services available for health managementandresourcesat home. Patient's daughter verbalizedunderstandingof needto seekreferral from primary care provider to [facility] care management ifdeemed necessary and appropriatefor anyservicesin thefuture.  [facility] care management information was provided for futureneedsthat patient may have.  Other daughter confirms that patient will be having [place] at home.   For additional questions please contact:  Edwena Felty A. Jeorgia Helming, BSN, RN-BC [facility]  PRIMARY CARE Navigator Cell: [phone]

## 2017-09-13 NOTE — Progress Notes (Signed)
Physical Therapy Treatment Patient Details Name: [name] MRN: [id number] DOB: 1930-06-09 Today's Date: 09/13/2017    History of Present Illness Pt is an 84 y/o male s/p I&D R foot secondary to gout flare with subsequent cellulitis. PMH including but not limited to coronary artery disease status post left heart cath on 10/24/2015 status post balloon angioplasty of OM 2, stage III CKD, obstructive sleep apnea on CPAP, hypertension, hyperlipidemia, gout, chronic atrial fibrillation on warfarin.    PT Comments    Pt performed attempted sit to stand transfer but unable due to acute gouty flare up of L arm in additon to new R LE NWB.  Pt is severely deconditioned and unable to stand with support of L leg and R arm.  His L knee is also swollen and warm to touch.  In addition to [facility] with elevating leg rest and RW, he will also require a long length sliding board and a drop arm commode to allow for safe lateral scoots to various surfaces.  Plan for continued HHPT at d/c.  Daughter reports patient will d/c home with transport at this time.  Will inform supervising PT of need for additional sliding board and change in 3:1 to drop arm 3:1.  Placed imminent d/c order for OT.   Case manager aware of need for new equipment recs at this time.       Follow Up Recommendations  Home health PT;Supervision/Assistance - 24 hour;Other (comment)(HH aide)     Equipment Recommendations  Rolling walker with 5" wheels;3in1 (PT);Wheelchair (measurements PT);Wheelchair cushion (measurements PT);Other (comment)(WC with elevating leg rests, drop arm commode, and long length sliding board.  )    Recommendations for Other Services       Precautions / Restrictions Precautions Precautions: Fall Restrictions Weight Bearing Restrictions: Yes RLE Weight Bearing: Weight bearing as tolerated    Mobility  Bed Mobility               General bed mobility comments: pt sitting OOB in recliner chair upon  arrival  Transfers Overall transfer level: Needs assistance Equipment used: Rolling walker (2 wheeled) Transfers: Sit to/from Stand;Lateral/Scoot Transfers Sit to Stand: Max assist(Pt performed transfer 3 quaters of the way but unable to maintain NWB.  )        Lateral/Scoot Transfers: With slide board;Min assist(he required mod to max assistance for set  up and placement.  ) General transfer comment: increased time and effort, cueing for safe hand placement and cueing for NWB R LE, Pt required increased assistance today as he is unable to maintain NWB with sit to stand transfers.  PTA performed additional trial of slide board transfer this am.  Pt with improved ability requiring min assistance to slide using sliding board.    Ambulation/Gait Ambulation/Gait assistance: (NT patient is unable to maintain weight bearing during transfers, therefore gait is not safe at this time.  )               Stairs             Wheelchair Mobility    Modified Rankin (Stroke Patients Only)       Balance Overall balance assessment: Needs assistance Sitting-balance support: No upper extremity supported Sitting balance-Leahy Scale: Fair       Standing balance-Leahy Scale: Poor                              Cognition Arousal/Alertness: Awake/alert Behavior During  Therapy: WFL for tasks assessed/performed Overall Cognitive Status: Within Functional Limits for tasks assessed                                        Exercises      General Comments        Pertinent Vitals/Pain Pain Assessment: 0-10 Pain Score: 6  Pain Location: R foot Pain Descriptors / Indicators: Sore;Throbbing Pain Intervention(s): Monitored during session;Repositioned    Home Living                      Prior Function            PT Goals (current goals can now be found in the care plan section) Acute Rehab PT Goals Patient Stated Goal: return home Potential to  Achieve Goals: Fair Progress towards PT goals: Progressing toward goals    Frequency    Min 3X/week      PT Plan Discharge plan needs to be updated    Co-evaluation              AM-PAC PT "6 Clicks" Daily Activity  Outcome Measure  Difficulty turning over in bed (including adjusting bedclothes, sheets and blankets)?: A Little Difficulty moving from lying on back to sitting on the side of the bed? : A Little Difficulty sitting down on and standing up from a chair with arms (e.g., wheelchair, bedside commode, etc,.)?: Unable Help needed moving to and from a bed to chair (including a wheelchair)?: A Little Help needed walking in hospital room?: Total Help needed climbing 3-5 steps with a railing? : Total 6 Click Score: 12    End of Session Equipment Utilized During Treatment: Gait belt Activity Tolerance: Patient tolerated treatment well Patient left: in chair;with call bell/phone within reach;with family/visitor present Nurse Communication: Mobility status PT Visit Diagnosis: Other abnormalities of gait and mobility (R26.89);Pain Pain - Right/Left: Right Pain - part of body: Ankle and joints of foot     Time: 2355-7322 PT Time Calculation (min) (ACUTE ONLY): 21 min  Charges:  $Therapeutic Activity: 8-22 mins                    G Codes:       Governor Rooks, PTA pager [phone]    Cristela Blue 09/13/2017, 12:02 PM

## 2017-09-13 NOTE — Progress Notes (Signed)
Physical Therapy Progress Note  Patient is s/p I&D R foot secondary to gout flare with cellulitis which impairs their ability to perform daily activities in the home. A walker alone will not resolve the issues with performing activities of daily living. A wheelchair will allow patient to safely perform daily activities. The patient can self propel in the home or has a caregiver who can provide assistance.  Rolinda Roan, PT, DPT Acute Rehabilitation Services Pager: [phone]

## 2017-09-13 NOTE — Discharge Summary (Signed)
Physician Discharge Summary  [name] IPJ:[id number] DOB: November 26, 1930 DOA: 09/09/2017  PCP: Tonia Ghent, MD  Admit date: 09/09/2017 Discharge date: 09/13/2017  Admitted From: home Disposition:  home  Recommendations for Outpatient Follow-up:  1. Follow up with Dr. Sharol Given in 1 week  Home Health: PT, OT, RN, aide Equipment/Devices: hospital bed, walker  Discharge Condition: stable CODE STATUS: Full code Diet recommendation: regular  HPI: Per Dr. Herbert Moors, [name] is a 84 y.o. male with medical history significant of coronary artery disease status post left heart cath on 10/24/2015 status post balloon angioplasty of OM 2, stage III CKD, obstructive sleep apnea on CPAP, hypertension, hyperlipidemia, gout, chronic atrial fibrillation on warfarin who comes in with concern for infected right hallux.  Patient reports that approximately 3 weeks ago he began to experience a "gout flare" with severe toe pain.  The prodraga have progressed with continued pain and some erythema and swelling of the first MTP joint.  He began to take his colchicine twice a day as prescribed with resolution of the pain however he began to develop erythema over the joint.  The swelling did not improve much.  The erythema began to spread proximally up to his middle foot until approximately 4 days ago when he was started on 1 dose of doxycycline and then transition to cefdinir.  Erythema has decreased significantly since then.  He reports only some soreness in the area but nothing similar to his gout pain.  He denies any fevers, nausea, vomiting, diarrhea, chills, chest pain, syncope/presyncope, cough, congestion, rhinorrhea.  She was seizing today by Dr. Sharol Given who wanted patient admitted and placed on IV antibiotics to evaluate for possible surgery tomorrow on 09/10/2017.  Hospital Course: Right hallux cellulitis superimposed on gout flare -Orthopedic surgery Dr. Sharol Given consulted, patient was taken to the operating  room on 6/28 and status post right foot debridement, partial excision of the first metatarsal head, excision of the capsule and debridement of the abscess and gout.  Cultures were sent, no growth. He was maintained on Vancomycin and Ceftriaxone, has remained stable and will be transitioned to Bactrim per orthopedics surgery for 1 week. He will follow up with Dr. Sharol Given in a week.  Hypertension -Continue home regimen  Chronic A. Fib -Continue rate control with diltiazem and metoprolol, anticoagulated with Coumadin. HHRN to draw INR in 2 days since he will be on antibiotics  Coronary artery disease -No chest pain, had a cardiac cath in 2017 status post balloon angioplasty Chronic diastolic CHF -Appears euvolemic Chronic kidney disease stage III -Creatinine overall stable at his baseline Gout -starting to have a flare in his left elbow, started Uloric   Discharge Diagnoses:  Active Problems:   Gout   OBESITY   GASTROESOPHAGEAL REFLUX DISEASE   OSA on CPAP   Chronic anticoagulation   Persistent atrial fibrillation (HCC)   Hypertension   Hyperlipidemia   CAD S/P percutaneous coronary angioplasty   Septic arthritis [facility])   Cutaneous abscess of right foot     Discharge Instructions  Discharge Instructions    Non weight bearing   Complete by:  As directed    Laterality:  right   Extremity:  Lower     Allergies as of 09/13/2017      Reactions   Penicillins Shortness Of Breath, Swelling   PATIENT HAS HAD A PCN REACTION WITH IMMEDIATE RASH, FACIAL/TONGUE/THROAT SWELLING, SOB, OR LIGHTHEADEDNESS WITH HYPOTENSION:  #  #  YES  #  Has patient had  a PCN reaction causing severe rash involving mucus membranes or skin necrosis: NO Has patient had a PCN reaction that required hospitalization NO Has patient had a PCN reaction occurring within the last 10 years: NO If all of the above answers are "NO", then may proceed with Cephalosporin use.   Atorvastatin Other (See Comments)   No energy, just  felt bad   Pravastatin Other (See Comments)   myalgias   Biaxin [clarithromycin] Diarrhea   Doxycycline Other (See Comments)   GI upset      Medication List    STOP taking these medications   cefdinir 300 MG capsule Commonly known as:  OMNICEF   colchicine 0.6 MG tablet Commonly known as:  COLCRYS     TAKE these medications   acetaminophen 500 MG tablet Commonly known as:  TYLENOL Take 1,000 mg by mouth as needed for mild pain.   diltiazem 180 MG 24 hr capsule Commonly known as:  CARDIZEM CD TAKE 1 CAPSULE (180 MG TOTAL) BY MOUTH DAILY.   Febuxostat 80 MG Tabs Commonly known as:  ULORIC Take 1 tablet (80 mg total) by mouth daily.   fluticasone 50 MCG/ACT nasal spray Commonly known as:  FLONASE Place 1-2 sprays into both nostrils daily as needed for allergies.   furosemide 20 MG tablet Commonly known as:  LASIX TAKE 1 TABLET (20 MG TOTAL) BY MOUTH DAILY.   lisinopril 20 MG tablet Commonly known as:  PRINIVIL,ZESTRIL TAKE 1 TABLET (20 MG TOTAL) BY MOUTH DAILY.   metoprolol tartrate 25 MG tablet Commonly known as:  LOPRESSOR TAKE 1/2 TABLET BY MOUTH TWICE A DAY   nitroGLYCERIN 0.4 MG SL tablet Commonly known as:  NITROSTAT Place 1 tablet (0.4 mg total) under the tongue every 5 (five) minutes x 3 doses as needed for chest pain.   omeprazole 40 MG capsule Commonly known as:  PRILOSEC TAKE ONE CAPSULE BY MOUTH EVERY DAY   ranitidine 150 MG tablet Commonly known as:  ZANTAC Take 1 tablet (150 mg total) by mouth 2 (two) times daily as needed for heartburn.   sulfamethoxazole-trimethoprim 800-160 MG tablet Commonly known as:  BACTRIM DS,SEPTRA DS Take 1 tablet by mouth 2 (two) times daily.   warfarin 4 MG tablet Commonly known as:  COUMADIN Take as directed. If you are unsure how to take this medication, talk to your nurse or doctor. Original instructions:  TAKE AS DIRECTED BY COUMADIN CLINIC What changed:  See the new instructions.            Durable  Medical Equipment  (From admission, onward)        Start     Ordered   09/13/17 1312  For home use only DME Overbed table  Once     09/13/17 1311   09/13/17 1157  For home use only DME Other see comment  Once    Comments:  Sliding board   09/13/17 1156   09/13/17 1156  For home use only DME 3 n 1  Once    Comments:  Drop arm   09/13/17 1156   09/13/17 1108  For home use only DME Hospital bed  Once    Question Answer Comment  Patient has (list medical condition): CHF, right foot cellulitis, OSA   The above medical condition requires: Patient requires the ability to reposition frequently   Head must be elevated greater than: 30 degrees   Bed type Semi-electric   Trapeze Bar Yes   Support Surface: Gel Overlay  09/13/17 1108   09/13/17 1048  For home use only DME lightweight manual wheelchair with seat cushion  Once    Comments:  Patient suffers from right hallux cellulitis right leg which impairs their ability to perform daily activities like standing, walking in the home.  A rolling walker, cane will not resolve  issue with performing activities of daily living. A wheelchair will allow patient to safely perform daily activities. Patient is not able to propel themselves in the home using a standard weight wheelchair due to weakness, pain. Patient can self propel in the lightweight wheelchair.  Accessories: elevating leg rests (ELRs), wheel locks, extensions and anti-tippers.   09/13/17 1049       Discharge Care Instructions  (From admission, onward)        Start     Ordered   09/13/17 0000  Non weight bearing    Question Answer Comment  Laterality right   Extremity Lower      09/13/17 0659     Follow-up Information    [name], MD In 1 week.   Specialty:  Orthopedic Surgery Contact information: [place] [address] [place] [facility].   Why:  Home Health Physical Therapy, aide, and RN- agency  will call to arrange appointment Contact information: [place].   Why:  hospital bed, light weight manual wheelchair, 3n1 bedside commode  Contact information: [address] [place] [address] [phone]           Consultations:  Orthopedic surgery   Procedures/Studies:  I&D    No results found.   Subjective: - no chest pain, shortness of breath, no abdominal pain, nausea or vomiting.   Discharge Exam: Vitals:   09/13/17 1025 09/13/17 1028  BP: 121/73 121/73  Pulse: 87   Resp:    Temp:    SpO2:      General: Pt is alert, awake, not in acute distress Cardiovascular: RRR, S1/S2 +, no rubs, no gallops Respiratory: CTA bilaterally, no wheezing, no rhonchi Abdominal: Soft, NT, ND, bowel sounds + Extremities: no edema, no cyanosis    The results of significant diagnostics from this hospitalization (including imaging, microbiology, ancillary and laboratory) are listed below for reference.     Microbiology: Recent Results (from the past 240 hour(s))  Culture, blood (routine x 2)     Status: None (Preliminary result)   Collection Time: 09/09/17 11:36 AM  Result Value Ref Range Status   Specimen Description BLOOD LEFT ANTECUBITAL  Final   Special Requests   Final    BOTTLES DRAWN AEROBIC AND ANAEROBIC Blood Culture adequate volume   Culture   Final    NO GROWTH 4 DAYS Performed at [facility], [address], [place], [place] [address]    Report Status PENDING  Incomplete  Culture, blood (routine x 2)     Status: None (Preliminary result)   Collection Time: 09/09/17 11:44 AM  Result Value Ref Range Status   Specimen Description BLOOD RIGHT ANTECUBITAL  Final   Special Requests   Final    BOTTLES DRAWN AEROBIC AND ANAEROBIC Blood Culture adequate volume   Culture   Final    NO GROWTH 4 DAYS Performed at [facility], [place]  [address], [place], [place] [address]    Report Status  PENDING  Incomplete  Surgical PCR screen     Status: None   Collection Time: 09/09/17  2:33 PM  Result Value Ref Range Status   MRSA, PCR NEGATIVE NEGATIVE Final   Staphylococcus aureus NEGATIVE NEGATIVE Final    Comment: (NOTE) The Xpert SA Assay (FDA approved for NASAL specimens in patients 64 years of age and older), is one component of a comprehensive surveillance program. It is not intended to diagnose infection nor to guide or monitor treatment. Performed at [facility], [place] [address] [place], [place] [address]   Aerobic/Anaerobic Culture (surgical/deep wound)     Status: None (Preliminary result)   Collection Time: 09/10/17  2:05 PM  Result Value Ref Range Status   Specimen Description ABSCESS RIGHT FOOT  Final   Special Requests NONE  Final   Gram Stain   Final    MODERATE WBC PRESENT,BOTH PMN AND MONONUCLEAR NO ORGANISMS SEEN    Culture   Final    NO GROWTH 3 DAYS NO ANAEROBES ISOLATED; CULTURE IN PROGRESS FOR 5 DAYS Performed at [facility], [place] [address], [place], [place] [address]    Report Status PENDING  Incomplete     Labs: BNP (last 3 results) No results for input(s): BNP in the last 8760 hours. Basic Metabolic Panel: Recent Labs  Lab 09/09/17 1048 09/09/17 1142 09/10/17 0421 09/12/17 0606  NA 138 139 138 136  K 4.4 4.4 4.1 4.3  CL 103 101 105 102  CO2 27 25 25 26   GLUCOSE 81 82 121* 133*  BUN 18 20 18 14   CREATININE 1.39* 1.30* 1.23 1.31*  CALCIUM 9.4 9.5 8.6* 8.9  MG  --  2.2  --   --    Liver Function Tests: Recent Labs  Lab 09/09/17 1142  AST 26  ALT 29  ALKPHOS 83  BILITOT 0.7  PROT 8.2*  ALBUMIN 4.3   No results for input(s): LIPASE, AMYLASE in the last 168 hours. No results for input(s): AMMONIA in the last 168 hours. CBC: Recent Labs  Lab 09/09/17 1142 09/10/17 0421 09/12/17 0606  WBC 7.6 5.3 8.5  NEUTROABS 5.1  --   --   HGB 16.3 12.2* 14.3  HCT 49.8 36.7*  43.6  MCV 88.0 88.2 89.7  PLT 255 187 186   Cardiac Enzymes: No results for input(s): CKTOTAL, CKMB, CKMBINDEX, TROPONINI in the last 168 hours. BNP: Invalid input(s): POCBNP CBG: Recent Labs  Lab 09/12/17 1116 09/12/17 1614 09/12/17 2118 09/13/17 0605 09/13/17 1158  GLUCAP 147* 128* 152* 130* 116*   D-Dimer No results for input(s): DDIMER in the last 72 hours. Hgb A1c No results for input(s): HGBA1C in the last 72 hours. Lipid Profile No results for input(s): CHOL, HDL, LDLCALC, TRIG, CHOLHDL, LDLDIRECT in the last 72 hours. Thyroid function studies No results for input(s): TSH, T4TOTAL, T3FREE, THYROIDAB in the last 72 hours.  Invalid input(s): FREET3 Anemia work up No results for input(s): VITAMINB12, FOLATE, FERRITIN, TIBC, IRON, RETICCTPCT in the last 72 hours. Urinalysis    Component Value Date/Time   COLORURINE YELLOW 09/22/2009 1640   APPEARANCEUR CLEAR 09/22/2009 1640   LABSPEC 1.011 09/22/2009 1640   PHURINE 5.5 09/22/2009 1640   GLUCOSEU NEGATIVE 09/22/2009 1640   HGBUR NEGATIVE 09/22/2009 1640   BILIRUBINUR NEGATIVE 09/22/2009 1640   KETONESUR NEGATIVE 09/22/2009 1640   PROTEINUR NEGATIVE 09/22/2009 1640   UROBILINOGEN 0.2 09/22/2009 1640   NITRITE NEGATIVE 09/22/2009 1640   LEUKOCYTESUR  09/22/2009 1640  NEGATIVE MICROSCOPIC NOT DONE ON URINES WITH NEGATIVE PROTEIN, BLOOD, LEUKOCYTES, NITRITE, OR GLUCOSE <1000 mg/dL.   Sepsis Labs Invalid input(s): PROCALCITONIN,  WBC,  LACTICIDVEN   Time coordinating discharge: 35 minutes  SIGNED:  Marzetta Board, MD  [facility] 09/13/2017, 3:53 PM Pager [phone]  If 7PM-7AM, please contact night-coverage [email or web address] Password [email or web address]

## 2017-09-13 NOTE — Progress Notes (Signed)
Written and verbal discharge instructions provided to the patient and his family.  The patient was discharged to go home via [place].

## 2017-09-14 ENCOUNTER — Encounter (HOSPITAL_COMMUNITY): Payer: Self-pay | Admitting: Emergency Medicine

## 2017-09-14 ENCOUNTER — Telehealth (INDEPENDENT_AMBULATORY_CARE_PROVIDER_SITE_OTHER): Payer: Self-pay | Admitting: Orthopedic Surgery

## 2017-09-14 ENCOUNTER — Emergency Department (HOSPITAL_COMMUNITY)
Admission: EM | Admit: 2017-09-14 | Discharge: 2017-09-15 | Disposition: A | Discharge status: Home / Self Care | Payer: Medicare Other | Attending: Emergency Medicine | Admitting: Emergency Medicine

## 2017-09-14 ENCOUNTER — Emergency Department (HOSPITAL_COMMUNITY): Payer: Medicare Other

## 2017-09-14 ENCOUNTER — Telehealth: Payer: Self-pay | Admitting: Family Medicine

## 2017-09-14 ENCOUNTER — Other Ambulatory Visit: Payer: Self-pay

## 2017-09-14 DIAGNOSIS — R300 Dysuria: Secondary | ICD-10-CM | POA: Insufficient documentation

## 2017-09-14 DIAGNOSIS — R279 Unspecified lack of coordination: Secondary | ICD-10-CM | POA: Diagnosis not present

## 2017-09-14 DIAGNOSIS — R109 Unspecified abdominal pain: Secondary | ICD-10-CM | POA: Diagnosis not present

## 2017-09-14 DIAGNOSIS — Z79899 Other long term (current) drug therapy: Secondary | ICD-10-CM | POA: Diagnosis not present

## 2017-09-14 DIAGNOSIS — R609 Edema, unspecified: Secondary | ICD-10-CM | POA: Diagnosis not present

## 2017-09-14 DIAGNOSIS — I213 ST elevation (STEMI) myocardial infarction of unspecified site: Secondary | ICD-10-CM | POA: Diagnosis not present

## 2017-09-14 DIAGNOSIS — R1084 Generalized abdominal pain: Secondary | ICD-10-CM | POA: Diagnosis not present

## 2017-09-14 DIAGNOSIS — N183 Chronic kidney disease, stage 3 (moderate): Secondary | ICD-10-CM | POA: Insufficient documentation

## 2017-09-14 DIAGNOSIS — Z7901 Long term (current) use of anticoagulants: Secondary | ICD-10-CM | POA: Diagnosis not present

## 2017-09-14 DIAGNOSIS — I129 Hypertensive chronic kidney disease with stage 1 through stage 4 chronic kidney disease, or unspecified chronic kidney disease: Secondary | ICD-10-CM | POA: Insufficient documentation

## 2017-09-14 DIAGNOSIS — K5900 Constipation, unspecified: Secondary | ICD-10-CM | POA: Diagnosis present

## 2017-09-14 DIAGNOSIS — I2510 Atherosclerotic heart disease of native coronary artery without angina pectoris: Secondary | ICD-10-CM | POA: Insufficient documentation

## 2017-09-14 DIAGNOSIS — R140 Abdominal distension (gaseous): Secondary | ICD-10-CM | POA: Diagnosis not present

## 2017-09-14 DIAGNOSIS — R509 Fever, unspecified: Secondary | ICD-10-CM | POA: Diagnosis not present

## 2017-09-14 DIAGNOSIS — Z87891 Personal history of nicotine dependence: Secondary | ICD-10-CM | POA: Insufficient documentation

## 2017-09-14 DIAGNOSIS — Z743 Need for continuous supervision: Secondary | ICD-10-CM | POA: Diagnosis not present

## 2017-09-14 DIAGNOSIS — I4891 Unspecified atrial fibrillation: Secondary | ICD-10-CM | POA: Diagnosis not present

## 2017-09-14 LAB — COMPREHENSIVE METABOLIC PANEL
ALK PHOS: 85 U/L (ref 38–126)
ALT: 34 U/L (ref 0–44)
ANION GAP: 13 (ref 5–15)
AST: 45 U/L — ABNORMAL HIGH (ref 15–41)
Albumin: 3.2 g/dL — ABNORMAL LOW (ref 3.5–5.0)
BILIRUBIN TOTAL: 0.9 mg/dL (ref 0.3–1.2)
BUN: 20 mg/dL (ref 8–23)
CALCIUM: 8.9 mg/dL (ref 8.9–10.3)
CO2: 25 mmol/L (ref 22–32)
Chloride: 97 mmol/L — ABNORMAL LOW (ref 98–111)
Creatinine, Ser: 1.57 mg/dL — ABNORMAL HIGH (ref 0.61–1.24)
GFR calc Af Amer: 45 mL/min — ABNORMAL LOW (ref >60)
GFR, EST NON AFRICAN AMERICAN: 39 mL/min — AB (ref >60)
Glucose, Bld: 138 mg/dL — ABNORMAL HIGH (ref 70–99)
POTASSIUM: 4.1 mmol/L (ref 3.5–5.1)
Sodium: 135 mmol/L (ref 135–145)
TOTAL PROTEIN: 7.3 g/dL (ref 6.5–8.1)

## 2017-09-14 LAB — PROTIME-INR
INR: 1.75
PROTHROMBIN TIME: 20.3 s — AB (ref 11.4–15.2)

## 2017-09-14 LAB — URINALYSIS, ROUTINE W REFLEX MICROSCOPIC
BACTERIA UA: NONE SEEN
Bilirubin Urine: NEGATIVE
Glucose, UA: NEGATIVE mg/dL
Ketones, ur: NEGATIVE mg/dL
Leukocytes, UA: NEGATIVE
NITRITE: NEGATIVE
PROTEIN: 100 mg/dL — AB
SPECIFIC GRAVITY, URINE: 1.016 (ref 1.005–1.030)
pH: 5 (ref 5.0–8.0)

## 2017-09-14 LAB — CULTURE, BLOOD (ROUTINE X 2)
Culture: NO GROWTH
Culture: NO GROWTH
Special Requests: ADEQUATE
Special Requests: ADEQUATE

## 2017-09-14 LAB — CBC WITH DIFFERENTIAL/PLATELET
ABS IMMATURE GRANULOCYTES: 0.1 10*3/uL (ref 0.0–0.1)
BASOS ABS: 0 10*3/uL (ref 0.0–0.1)
BASOS PCT: 0 %
EOS ABS: 0 10*3/uL (ref 0.0–0.7)
Eosinophils Relative: 0 %
HCT: 42.3 % (ref 39.0–52.0)
Hemoglobin: 13.9 g/dL (ref 13.0–17.0)
IMMATURE GRANULOCYTES: 0 %
Lymphocytes Relative: 7 %
Lymphs Abs: 0.8 10*3/uL (ref 0.7–4.0)
MCH: 29.1 pg (ref 26.0–34.0)
MCHC: 32.9 g/dL (ref 30.0–36.0)
MCV: 88.7 fL (ref 78.0–100.0)
MONO ABS: 1.1 10*3/uL — AB (ref 0.1–1.0)
MONOS PCT: 9 %
NEUTROS ABS: 9.8 10*3/uL — AB (ref 1.7–7.7)
NEUTROS PCT: 84 %
PLATELETS: 226 10*3/uL (ref 150–400)
RBC: 4.77 MIL/uL (ref 4.22–5.81)
RDW: 13.9 % (ref 11.5–15.5)
WBC: 11.9 10*3/uL — ABNORMAL HIGH (ref 4.0–10.5)

## 2017-09-14 LAB — I-STAT CG4 LACTIC ACID, ED
LACTIC ACID, VENOUS: 1.45 mmol/L (ref 0.5–1.9)
LACTIC ACID, VENOUS: 1.07 mmol/L (ref 0.5–1.9)

## 2017-09-14 MED ORDER — ACETAMINOPHEN 325 MG PO TABS
650.0000 mg | ORAL_TABLET | Freq: Once | ORAL | Status: AC | PRN
  Administered 2017-09-14: 650 mg via ORAL
  Filled 2017-09-14: qty 2

## 2017-09-14 MED ORDER — IOHEXOL 300 MG/ML  SOLN
80.0000 mL | Freq: Once | INTRAMUSCULAR | Status: AC | PRN
  Administered 2017-09-14: 80 mL via INTRAVENOUS

## 2017-09-14 NOTE — Telephone Encounter (Signed)
I likely can't get out there during business hours this week.  It'll have to be next week at the earliest.   What is his status with ortho f/u and home health (esp re: INR/coumadin)?  Let me know.  He may need ortho f/u sooner than with me.  Thanks.

## 2017-09-14 NOTE — Telephone Encounter (Signed)
[name] advised but says they have had to rush him back to the hospital.  Patient has not had a BM in 4 days and is now unable to urinate, stomach is swollen, spiked a fever.  Paramedics thought he was septic but [name] says the hospital staff doesn't think so but he is getting additional tests and will be re-admitted.  Patient was getting [name] who was checking INR/Coumadin at home, OT and PT.  [name] says she probably will need to see if you could come out one day next week, depending on his discharge from this hospital admission.

## 2017-09-14 NOTE — Discharge Instructions (Addendum)
As discussed, your evaluation today has been largely reassuring.  But, it is important that you monitor your condition carefully, and do not hesitate to return to the ED if you develop new, or concerning changes in your condition. ? ?Otherwise, please follow-up with your physician for appropriate ongoing care. ? ?

## 2017-09-14 NOTE — ED Notes (Signed)
Notified [facility] for transportation back home 

## 2017-09-14 NOTE — ED Provider Notes (Signed)
[facility] EMERGENCY DEPARTMENT Provider Note   CSN: [id number] Arrival date & time: 09/14/17  1407     History   Chief Complaint Chief Complaint  Patient presents with  . Constipation  . Fever  . Post-op Problem    HPI [name] is a 84 y.o. male.  HPI Patient presented to the emergency room for evaluation of abdominal discomfort associated with constipation.  he was recently admitted to the hospital on June 27.  Patient was admitted for trouble with increasing pain in his foot.  Patient had been presumptively treating for gout.  Patient was then transitioned to antibiotics for possible cellulitis.  The patient ended up having surgical debridement of his foot wound on June 28.  Patient was in the hospital until just yesterday when he was discharged.  Patient states that over the last 4 days he has not had any bowel movements.  Patient felt constipated.  He said he mentioned this in the hospital and they instructed him to take stool softeners.  Patient presented to the emergency room today because he feels like he still having constipation.  He has only had one small bowel movement.  He also noticed that his urine is dark and he feels like he is having some discomfort with urination. Past Medical History:  Diagnosis Date  . Bradycardia    a. nocturnal with pauses overnight on tele likely due to OSA 10/2015.  [name] CKD (chronic kidney disease), stage III ([place])   . Coronary artery disease    a. prev nonobst. b. LHC 10/24/15: occlusion of small diffusely diseased OM2 s/p balloon angioplasty, mild nonobstructive disease of mLAD and mRCA, normal LVEDP  . Diverticulosis   . Esophageal reflux   . Esophageal stricture from GERD 06/08/2011   With stricture at GE junction on EGD, dilated 06/2011   . Gastric ulcer    on EGD 2013  . Gout, unspecified    "on daily RX" (09/09/2017)  . History of blood transfusion 02/1964   "when I had right little finger cut off"  .  Hyperlipidemia   . Hypertension   . Obesity, unspecified   . Orthopnea   . OSA on CPAP   . Paroxysmal nocturnal dyspnea   . Persistent atrial fibrillation ([place])   . Vertigo     Patient Active Problem List   Diagnosis Date Noted  . Cutaneous abscess of right foot   . Septic arthritis ([place]) 09/09/2017  . Atypical angina ([place]) 11/08/2015  . Acute diastolic heart failure ([place])   . CAD S/P percutaneous coronary angioplasty   . Chronic atrial fibrillation ([place])   . CKD (chronic kidney disease), stage III ([place])   . Persistent atrial fibrillation ([place])   . Hypertension   . Hyperlipidemia   . Bradycardia   . Glucose intolerance (impaired glucose tolerance) 10/22/2015  . Back pain 05/24/2015  . Triceps tendonitis 05/24/2015  . ETD (eustachian tube dysfunction) 05/24/2015  . Hyperglycemia 12/18/2014  . Dysphagia, pharyngoesophageal phase 09/25/2014  . History of esophageal stricture 09/25/2014  . Chronic anticoagulation 09/25/2014  . Helicobacter pylori duodenitis 09/25/2014  . Benign paroxysmal positional vertigo 03/14/2014  . Encounter for therapeutic drug monitoring 04/12/2013  . Esophageal stricture from GERD 06/08/2011  . Advance care planning 04/20/2011  . OSA on CPAP 09/22/2010  . HYPERCHOLESTEROLEMIA 08/15/2008  . Gout 08/15/2008  . OBESITY 08/15/2008  . Essential hypertension 08/15/2008  . CAD (coronary artery disease), native coronary artery 08/15/2008  . GASTROESOPHAGEAL REFLUX DISEASE 08/15/2008  Past Surgical History:  Procedure Laterality Date  . BALLOON DILATION N/A 10/22/2014   Procedure: BALLOON DILATION;  Surgeon: [name], MD;  Location: [facility];  Service: Endoscopy;  Laterality: N/A;  . CARDIAC CATHETERIZATION  07/2008  . CARDIAC CATHETERIZATION N/A 10/24/2015   Procedure: Left Heart Cath and Coronary Angiography;  Surgeon: [name], MD;  Location: [facility];  Service: Cardiovascular;  Laterality: N/A;  . CARDIAC CATHETERIZATION  N/A 10/24/2015   Procedure: Coronary Balloon Angioplasty;  Surgeon: [name], MD;  Location: [facility];  Service: Cardiovascular;  Laterality: N/A;  . Carotid Dopplers  09/2009   no sig extracranial stenosis and vertebral arteries had antegrade flow  . CATARACT EXTRACTION W/ INTRAOCULAR LENS  IMPLANT, BILATERAL Bilateral   . ESOPHAGOGASTRODUODENOSCOPY (EGD) WITH ESOPHAGEAL DILATION     "I've had it stretched 3 times" (10/23/2015)  . ESOPHAGOGASTRODUODENOSCOPY (EGD) WITH ESOPHAGEAL DILATION  06/2011   /medical hx above  (09/09/2017)  . ESOPHAGOGASTRODUODENOSCOPY (EGD) WITH PROPOFOL N/A 10/22/2014   Procedure: ESOPHAGOGASTRODUODENOSCOPY (EGD) WITH PROPOFOL;  Surgeon: [name], MD;  Location: [facility];  Service: Endoscopy;  Laterality: N/A;  . FINGER SURGERY Right 02/1964   "cut little finger off; had it reattached"  . I&D EXTREMITY Right 09/10/2017   Procedure: RIGHT FOOT DEBRIDEMENT;  Surgeon: [name], MD;  Location: [facility];  Service: Orthopedics;  Laterality: Right;  . MRI of brain  09/2009   chronic microvascular ischemia  . OSA Sleep Study     per Dr. Brett Fairy  . SHOULDER ARTHROSCOPY W/ ROTATOR CUFF REPAIR Right 2008        Home Medications    Prior to Admission medications   Medication Sig Start Date End Date Taking? Authorizing Provider  acetaminophen (TYLENOL) 500 MG tablet Take 1,000 mg by mouth as needed for mild pain.    [provider]  diltiazem (CARDIZEM CD) 180 MG 24 hr capsule TAKE 1 CAPSULE (180 MG TOTAL) BY MOUTH DAILY. 06/29/17   Sherren Mocha, MD  Febuxostat (ULORIC) 80 MG TABS Take 1 tablet (80 mg total) by mouth daily. 09/13/17   [name], MD  fluticasone (FLONASE) 50 MCG/ACT nasal spray Place 1-2 sprays into both nostrils daily as needed for allergies.     [provider]  furosemide (LASIX) 20 MG tablet TAKE 1 TABLET (20 MG TOTAL) BY MOUTH DAILY. 06/29/17   Sherren Mocha, MD  lisinopril (PRINIVIL,ZESTRIL) 20 MG  tablet TAKE 1 TABLET (20 MG TOTAL) BY MOUTH DAILY. 05/14/17   Sherren Mocha, MD  metoprolol tartrate (LOPRESSOR) 25 MG tablet TAKE 1/2 TABLET BY MOUTH TWICE A DAY 09/28/16   Sherren Mocha, MD  nitroGLYCERIN (NITROSTAT) 0.4 MG SL tablet Place 1 tablet (0.4 mg total) under the tongue every 5 (five) minutes x 3 doses as needed for chest pain. 11/09/15   Lyda Jester M, PA-C  omeprazole (PRILOSEC) 40 MG capsule TAKE ONE CAPSULE BY MOUTH EVERY DAY 08/26/17   [name], MD  ranitidine (ZANTAC) 150 MG tablet Take 1 tablet (150 mg total) by mouth 2 (two) times daily as needed for heartburn. Patient not taking: Reported on 09/09/2017 07/13/17   Tonia Ghent, MD  sulfamethoxazole-trimethoprim (BACTRIM DS,SEPTRA DS) 800-160 MG tablet Take 1 tablet by mouth 2 (two) times daily. 09/13/17   [name], MD  warfarin (COUMADIN) 4 MG tablet TAKE AS DIRECTED BY COUMADIN CLINIC Patient taking differently: taking 6mg  on Monday,thursday,Sunday. taking 4mg  on Tues, Wed, Fri, Sat. 08/25/17  Sherren Mocha, MD    Family History Family History  Problem Relation Age of Onset  . Stomach cancer Mother        died in 68's   . Hypertension Father        died in his 39's pna likely dementia  . Dementia Father   . Pneumonia Father   . Heart disease Brother        S/P CABG  . Hypertension Other   . Prostate cancer Neg Hx   . Colon cancer Neg Hx     Social History Social History   Tobacco Use  . Smoking status: Former Smoker    Packs/day: 0.12    Years: 4.00    Pack years: 0.48    Types: Cigarettes    Last attempt to quit: 03/16/1969    Years since quitting: 48.5  . Smokeless tobacco: Former Systems developer  . Tobacco comment: "chewed when I smoked; smoked 1 pack/week for about 4 years, quit 1971  Substance Use Topics  . Alcohol use: Not Currently    Alcohol/week: 0.0 oz    Comment: 09/09/2017  "Quit all alcohol in 1970's"  . Drug use: Never     Allergies   Penicillins; Atorvastatin; Pravastatin;  Biaxin [clarithromycin]; and Doxycycline   Review of Systems Review of Systems  All other systems reviewed and are negative.    Physical Exam Updated Vital Signs BP 126/73   Pulse 95   Temp (!) 102.6 F (39.2 C) (Oral)   Resp 20   Ht 1.676 m (5\' 6" )   Wt 94.3 kg (208 lb)   SpO2 97%   BMI 33.57 kg/m   Physical Exam  Constitutional: No distress.  HENT:  Head: Normocephalic and atraumatic.  Right Ear: External ear normal.  Left Ear: External ear normal.  Eyes: Conjunctivae are normal. Right eye exhibits no discharge. Left eye exhibits no discharge. No scleral icterus.  Neck: Neck supple. No tracheal deviation present.  Cardiovascular: Normal rate, regular rhythm and intact distal pulses.  Pulmonary/Chest: Effort normal and breath sounds normal. No stridor. No respiratory distress. He has no wheezes. He has no rales.  Abdominal: Soft. Bowel sounds are normal. He exhibits distension. There is no tenderness. There is no rebound and no guarding.  Protuberant abdomen  Genitourinary:  Genitourinary Comments: No fecal impaction  Musculoskeletal: He exhibits edema. He exhibits no tenderness.  Bruising noted around the incision, no lymphangitic streaking of the foot or lower extremities  Neurological: He is alert. He has normal strength. No cranial nerve deficit (no facial droop, extraocular movements intact, no slurred speech) or sensory deficit. He exhibits normal muscle tone. He displays no seizure activity. Coordination normal.  Skin: Skin is warm and dry. No rash noted.  Psychiatric: He has a normal mood and affect.  Nursing note and vitals reviewed.    ED Treatments / Results  Labs (all labs ordered are listed, but only abnormal results are displayed) Labs Reviewed  CULTURE, BLOOD (ROUTINE X 2)  CULTURE, BLOOD (ROUTINE X 2)  COMPREHENSIVE METABOLIC PANEL  CBC WITH DIFFERENTIAL/PLATELET  PROTIME-INR  URINALYSIS, ROUTINE W REFLEX MICROSCOPIC  I-STAT CG4 LACTIC ACID, ED      EKG EKG Interpretation  Date/Time:  Tuesday September 14 2017 14:21:26 EDT Ventricular Rate:  100 PR Interval:    QRS Duration: 85 QT Interval:  303 QTC Calculation: 405 R Axis:   31 Text Interpretation:  Atrial fibrillation No significant change since last tracing Confirmed by Dorie Rank [phone]) on 09/14/2017 2:47:58  PM   Radiology No results found.  Procedures Procedures (including critical care time)  Medications Ordered in ED Medications  acetaminophen (TYLENOL) tablet 650 mg (650 mg Oral Given 09/14/17 1443)     Initial Impression / Assessment and Plan / ED Course  I have reviewed the triage vital signs and the nursing notes.  Pertinent labs & imaging results that were available during my care of the patient were reviewed by me and considered in my medical decision making (see chart for details).   Pt presents with fever, complaints of dysuria.  No bowel movement recently.  No discrete areas of ttp on abd exam.  Foot does not appear acutely infected.  Labs and xrays pending.  Will turn over case to Dr Vanita Panda.  Final Clinical Impressions(s) / ED Diagnoses  pending   Dorie Rank, MD 09/14/17 [phone]

## 2017-09-14 NOTE — Care Management Important Message (Signed)
Important Message  Patient Details  Name: [name] MRN: [id number] Date of Birth: 06/10/1930   Medicare Important Message Given:  Yes    [name] 09/14/2017, 8:36 AM

## 2017-09-14 NOTE — Telephone Encounter (Signed)
Copied from [name] [id number]. Topic: Quick Communication - See Telephone Encounter >> Sep 14, 2017 11:36 AM [name] wrote: Pt is now home bound and has been released from the hospital.   Pt is needing an inhome, hospital f/u visit from Dr. Damita Dunnings.  Daughter - Dora Sims - [phone]

## 2017-09-14 NOTE — ED Notes (Signed)
Pt bladder scanned using two different scanners. Both scanners stated ZERO. Informed Michelle - RN.

## 2017-09-14 NOTE — Telephone Encounter (Signed)
Patient's daughter Fletcher Anon) called asked if there is a safer medication for patient's heart?  She advised patient has gout. She was not clear on when patient need his post op visit. She also said patient has gout in his elbow now. The number to contact Fletcher Anon is [phone] or [phone]

## 2017-09-14 NOTE — ED Provider Notes (Signed)
7:28 PM Patient afebrile, hemodynamically unremarkable, awake and alert. We discussed findings of CT scan, labs again. On repeat exam patient's foot is evaluated, appears well-healing. Daughter confirms that the patient is taking antibiotics as prescribed. Patient states that he feels fine. Given his reassuring vitals, labs notable only for mild leukocytosis, but otherwise no lactic acidosis or evidence for bacteremia, sepsis, patient discharged with close outpatient follow-up with his orthopedic team.   Carmin Muskrat, MD 09/14/17 1929

## 2017-09-14 NOTE — ED Triage Notes (Signed)
Arrived via EMS from home. Patient had surgery right foot for gout Friday and discharged yesterday. Patient has not had a BM 5 days and having difficulty urinating. Abdominal distention increasing and firm. Daughter states discharged from hospital with 100.34F and today temperature 100.20F. Patient alert answering and following commands appropriate.

## 2017-09-15 ENCOUNTER — Ambulatory Visit (INDEPENDENT_AMBULATORY_CARE_PROVIDER_SITE_OTHER): Payer: Medicare Other | Admitting: Internal Medicine

## 2017-09-15 ENCOUNTER — Telehealth (INDEPENDENT_AMBULATORY_CARE_PROVIDER_SITE_OTHER): Payer: Self-pay | Admitting: Family

## 2017-09-15 DIAGNOSIS — I481 Persistent atrial fibrillation: Secondary | ICD-10-CM | POA: Diagnosis not present

## 2017-09-15 DIAGNOSIS — L03031 Cellulitis of right toe: Secondary | ICD-10-CM | POA: Diagnosis not present

## 2017-09-15 DIAGNOSIS — Z5181 Encounter for therapeutic drug level monitoring: Secondary | ICD-10-CM | POA: Diagnosis not present

## 2017-09-15 DIAGNOSIS — I482 Chronic atrial fibrillation, unspecified: Secondary | ICD-10-CM

## 2017-09-15 DIAGNOSIS — I130 Hypertensive heart and chronic kidney disease with heart failure and stage 1 through stage 4 chronic kidney disease, or unspecified chronic kidney disease: Secondary | ICD-10-CM | POA: Diagnosis not present

## 2017-09-15 DIAGNOSIS — I4819 Other persistent atrial fibrillation: Secondary | ICD-10-CM

## 2017-09-15 LAB — AEROBIC/ANAEROBIC CULTURE W GRAM STAIN (SURGICAL/DEEP WOUND)

## 2017-09-15 LAB — POCT INR: INR: 1.6 — AB (ref 2.0–3.0)

## 2017-09-15 LAB — AEROBIC/ANAEROBIC CULTURE (SURGICAL/DEEP WOUND): CULTURE: NO GROWTH

## 2017-09-15 NOTE — ED Notes (Signed)
Patient Alert and oriented to baseline. Stable and ambulatory to baseline. Patient verbalized understanding of the discharge instructions.  Patient belongings were taken by the patient. Patient transported by [facility] back home.

## 2017-09-15 NOTE — Telephone Encounter (Signed)
Patient daughter Lawerance Bach called and I advised of note in system, asked about medication and appt-advised [place] needed info on meds she had question about.  She stated forget about that for now and give her appt. Was not pleased about having appt w/Erin wanted to know why appt could not be w/DUDA.  Gave patient appt. You mazy want to call her back [phone]

## 2017-09-15 NOTE — Telephone Encounter (Signed)
Noted. Thanks.  Please try to get update on Friday of this week.

## 2017-09-15 NOTE — Telephone Encounter (Signed)
I had called and sw pt's daughter [name] and she states that the pt's uloric 80 mg was 400$ for the rx. I advised that we would not want them to have to pay this amount for rx. I left 2 weeks of samples at the front desk for pick up ( they had already purchased 1 week supply) advised that they want to discuss treatment options with Dr. Sharol Given when he comes in for his appt next week.

## 2017-09-15 NOTE — Telephone Encounter (Signed)
Called and lm on vm to advise to call back and leave message to explain what medication she has question about. And also to advise that we can make a post op appt with Erin on 09/23/17 for the pt to follow up from I&D he had last week of his foot. Will hold message and await return phone call.

## 2017-09-15 NOTE — Patient Instructions (Signed)
Description   Spoke with [facility] nurse with [facility] while in the home with the pt and instructed to have pt continue on same dose of coumadin  1 tablet (4mg ) daily except 1 and 1/2 tablets  (6mg ) on Sundays and Thursdays. Recheck INR on Monday July 8th as he started Bactrim DS 1 bid for 20 days started today July 3rd

## 2017-09-17 ENCOUNTER — Telehealth: Payer: Self-pay

## 2017-09-17 DIAGNOSIS — I130 Hypertensive heart and chronic kidney disease with heart failure and stage 1 through stage 4 chronic kidney disease, or unspecified chronic kidney disease: Secondary | ICD-10-CM | POA: Diagnosis not present

## 2017-09-17 DIAGNOSIS — L03031 Cellulitis of right toe: Secondary | ICD-10-CM | POA: Diagnosis not present

## 2017-09-17 NOTE — Telephone Encounter (Signed)
I'll check on the visit at home.  Let them know and please route this back to me so I can see what I can do.  Thanks.

## 2017-09-17 NOTE — Telephone Encounter (Signed)
Left detailed message on voicemail of [name] (daughter) to return call with information.

## 2017-09-17 NOTE — Telephone Encounter (Signed)
Daughter advised.  Daughter says he has an orthopedic follow up on Thursday, otherwise, anytime will be fine.

## 2017-09-17 NOTE — Telephone Encounter (Signed)
Unable to reach [place] by phone (DPR signed).

## 2017-09-17 NOTE — Telephone Encounter (Signed)
Copied from [place] [id number]. Topic: General - Other >> Sep 17, 2017  8:58 AM Judyann Munson wrote: Reason for CRM: patient daughter is calling and stating she doesn't want to leave a message  on the patient states,  she is requesting to speak to someone immediately. Please advise  Cb: [phone]

## 2017-09-17 NOTE — Telephone Encounter (Signed)
See CRM note.

## 2017-09-17 NOTE — Telephone Encounter (Signed)
Spoke with [name] who says the hospital ER said they thought his pain was from gout and sent him home.  They didn't do anything about the constipation and lack of urination but the patient finally had a BM after 5 days and then was able to urinate.  Patient is doing well now.  Daughter asks if Dr. Damita Dunnings could make a home visit one day next week as hospital follow up.  Orthopedics FU is on July 11th.

## 2017-09-19 LAB — CULTURE, BLOOD (ROUTINE X 2)
CULTURE: NO GROWTH
Culture: NO GROWTH
Special Requests: ADEQUATE

## 2017-09-19 NOTE — Telephone Encounter (Signed)
See if Tuesday night ~6pm will work, after clinic.  Please let me know.  Thanks.

## 2017-09-20 ENCOUNTER — Ambulatory Visit (INDEPENDENT_AMBULATORY_CARE_PROVIDER_SITE_OTHER): Payer: Medicare Other | Admitting: Pharmacist

## 2017-09-20 DIAGNOSIS — I481 Persistent atrial fibrillation: Secondary | ICD-10-CM | POA: Diagnosis not present

## 2017-09-20 DIAGNOSIS — I130 Hypertensive heart and chronic kidney disease with heart failure and stage 1 through stage 4 chronic kidney disease, or unspecified chronic kidney disease: Secondary | ICD-10-CM | POA: Diagnosis not present

## 2017-09-20 DIAGNOSIS — I4819 Other persistent atrial fibrillation: Secondary | ICD-10-CM

## 2017-09-20 DIAGNOSIS — Z5181 Encounter for therapeutic drug level monitoring: Secondary | ICD-10-CM | POA: Diagnosis not present

## 2017-09-20 DIAGNOSIS — L03031 Cellulitis of right toe: Secondary | ICD-10-CM | POA: Diagnosis not present

## 2017-09-20 LAB — POCT INR: INR: 1.8 — AB (ref 2.0–3.0)

## 2017-09-20 NOTE — Telephone Encounter (Addendum)
Phoned daughter Lawerance Bach).  See Louie Casa Pickrell's chart for note.

## 2017-09-20 NOTE — Telephone Encounter (Signed)
Daughter advised and says that is fine.

## 2017-09-20 NOTE — Telephone Encounter (Signed)
Daughter Requesting to speak with [place], [phone]

## 2017-09-21 ENCOUNTER — Telehealth: Payer: Self-pay | Admitting: Family Medicine

## 2017-09-21 ENCOUNTER — Ambulatory Visit (INDEPENDENT_AMBULATORY_CARE_PROVIDER_SITE_OTHER): Payer: Medicare Other | Admitting: Family Medicine

## 2017-09-21 DIAGNOSIS — I2510 Atherosclerotic heart disease of native coronary artery without angina pectoris: Secondary | ICD-10-CM

## 2017-09-21 DIAGNOSIS — L03031 Cellulitis of right toe: Secondary | ICD-10-CM | POA: Diagnosis not present

## 2017-09-21 DIAGNOSIS — M109 Gout, unspecified: Secondary | ICD-10-CM

## 2017-09-21 DIAGNOSIS — I130 Hypertensive heart and chronic kidney disease with heart failure and stage 1 through stage 4 chronic kidney disease, or unspecified chronic kidney disease: Secondary | ICD-10-CM | POA: Diagnosis not present

## 2017-09-21 NOTE — Telephone Encounter (Signed)
If he declined it, then no need to give orders and I'll defer to patient.  Thanks.

## 2017-09-21 NOTE — Telephone Encounter (Signed)
Please print a face sheet and add him to the schedule for today so I can document the visit.  Thanks.

## 2017-09-21 NOTE — Telephone Encounter (Signed)
Copied from [place] [id number]. Topic: Quick Communication - See Telephone Encounter >> Sep 21, 2017  1:03 PM Conception [place]ancy, NT wrote: CRM for notification. See Telephone encounter for: 09/21/17.  Jenny Reichmann is a occupational therapist calling with [facility] home [facility] requesting verbal orders for OT evaluation for 09/24/17. Patient declined OT evaluation and wants to wait until after Thursday when he sees his orthopedic doctor.  Cb# [phone] ok for VM

## 2017-09-21 NOTE — Telephone Encounter (Signed)
Jenny Reichmann, OT with [facility] advised.

## 2017-09-21 NOTE — Telephone Encounter (Signed)
Done

## 2017-09-22 ENCOUNTER — Encounter: Payer: Self-pay | Admitting: Family Medicine

## 2017-09-22 ENCOUNTER — Other Ambulatory Visit: Payer: Self-pay | Admitting: *Deleted

## 2017-09-22 MED ORDER — COLCHICINE 0.6 MG PO TABS: 0.6000 mg | ORAL_TABLET | Freq: Two times a day (BID) | ORAL | 1 refills | Status: DC

## 2017-09-22 NOTE — Assessment & Plan Note (Signed)
Wound is still dressed.  I will defer to orthopedics.  He is still on antibiotics per orthopedics.  He has no fevers now.  His INR is being monitored and his Coumadin has been adjusted based on his concurrent Septra use.  He feels better overall.  Likely reasonable to avoid Uloric.  At some point in the future we can recheck his creatinine and uric acid level and consider restarting allopurinol.  I would hold off on this for now.  Discussed with patient about rationale.  He agrees.  Reasonable to continue colchicine twice a day as needed for now.  He can gradually taper off of that as tolerated.  Rationale discussed with patient.  I will await the orthopedic notes.  He will update me as needed in the meantime.  >25 minutes spent in face to face time with patient, >50% spent in counselling or coordination of care.

## 2017-09-22 NOTE — Progress Notes (Signed)
Home visit.  Patient had significant right foot gout symptoms.  It became secondarily infected.  He needed admission to hospital for incision and drainage when he failed outpatient treatment.  He reports having red man reaction to vancomycin but was able to tolerate this otherwise without reaction when vancomycin was given via slow run and he was treated with Benadryl.  He is going to have orthopedic follow-up later this month but he needs significant help with transport in the meantime.  After discharge home he started Uloric but he had worsening gout symptoms.  This involve the left elbow left wrist and left foot.  This medication has been stopped in the meantime and he was started back on colchicine 0.6 mg twice a day.  He is not currently on allopurinol.  Per patient report he was not consistent with allopurinol use prior to this gout flare.  We talked about potential issues with Uloric use.   He is currently on antibiotics.  No fevers for the last 48 hours.  This is an improvement.  He is still treated with Septra currently.  Still on warfarin and this is being monitored.  Recent INR noted.  Discussed with patient.  He is eating okay.  He is nonweightbearing.  He has no pain currently.  He has an occasional cough with some clear sputum.  He is using incentive spirometry.  He has PT and nursing coming out to the house.  PMH and SH reviewed  ROS: Per HPI unless specifically indicated in ROS section   Meds, vitals, and allergies reviewed.   GEN: nad, alert and oriented, in recliner  HEENT: mucous membranes moist NECK: supple w/o LA CV: IRR, not tachy PULM: ctab, no inc wob ABD: soft, +bs EXT: no edema SKIN: no acute rash- see below re: gouty changes.  Right foot is bandaged.  Normal range of motion at the ankle.  Left foot with lateral malleolus slightly pinkish.  Not tender now.  Left elbow and left wrist with slight pinkish changes consistent with resolving gout flare.  Not tender now.

## 2017-09-22 NOTE — Consult Note (Signed)
Made aware by Tommi Rumps with [facility] that patient enrolled in the [facility] program after recent hospitalization.  Will send notification to [employer] office to make aware of [facility] enrollment.   Marthenia Rolling, MSN-Ed, RN,BSN [facility] Liaison [phone]

## 2017-09-23 ENCOUNTER — Ambulatory Visit (INDEPENDENT_AMBULATORY_CARE_PROVIDER_SITE_OTHER): Payer: Medicare Other | Admitting: Orthopedic Surgery

## 2017-09-23 ENCOUNTER — Encounter (INDEPENDENT_AMBULATORY_CARE_PROVIDER_SITE_OTHER): Payer: Self-pay | Admitting: Orthopedic Surgery

## 2017-09-23 DIAGNOSIS — L03031 Cellulitis of right toe: Secondary | ICD-10-CM | POA: Diagnosis not present

## 2017-09-23 DIAGNOSIS — M1A0710 Idiopathic chronic gout, right ankle and foot, without tophus (tophi): Secondary | ICD-10-CM

## 2017-09-23 DIAGNOSIS — I130 Hypertensive heart and chronic kidney disease with heart failure and stage 1 through stage 4 chronic kidney disease, or unspecified chronic kidney disease: Secondary | ICD-10-CM | POA: Diagnosis not present

## 2017-09-23 NOTE — Progress Notes (Signed)
Office Visit Note   Patient: [name]           Date of Birth: 1930/09/08           MRN: [id number] Visit Date: 09/23/2017              Requested by: Tonia Ghent, MD [address] [place], [place] [address] PCP: Tonia Ghent, MD  Chief Complaint  Patient presents with  . Right Foot - Routine Post Op    09/10/17 right foot debridement      HPI: Patient is an 84 year old gentleman status post debridement for infected gout right great toe MTP joint.  Patient is currently on colchicine.  Patient states that he had a flareup of his gout while taking Uloric.  He is not on allopurinol at this time but will transition once the gout is under better control with the colchicine.  Assessment & Plan: Visit Diagnoses:  1. Idiopathic chronic gout of right foot without tophus     Plan: We will place him in a Darco shoe so he can ambulate around the home.  Patient will wear the medical compression stocking around-the-clock change this daily with Dial soap cleansing.  Harvest the sutures at follow-up.  Follow-Up Instructions: Return in about 2 weeks (around 10/07/2017).   Ortho Exam  Patient is alert, oriented, no adenopathy, well-dressed, normal affect, normal respiratory effort. Examination patient has no clinical active gout of the right great toe.  The wound edges are well approximated except for the center of the wound is gaped open about 2 mm.  There is good healthy granulation tissue at the base with compression I cannot express any tophaceous material or infection there is no tenderness to palpation.  Imaging: No results found. No images are attached to the encounter.  Labs: Lab Results  Component Value Date   HGBA1C  07/31/2008    6.1 (NOTE) The ADA recommends the following therapeutic goal for glycemic control related to Hgb A1c measurement: Goal of therapy: <6.5 Hgb A1c  Reference: American Diabetes Association: Clinical Practice Recommendations 2010,  Diabetes Care, 2010, 33: (Suppl  1).   ESRSEDRATE 17 (H) 09/09/2017   CRP 1.1 (H) 09/09/2017   LABURIC 9.0 (H) 09/09/2017   LABURIC 10.2 (H) 02/13/2016   LABURIC 9.5 (H) 12/06/2014   REPTSTATUS 09/19/2017 FINAL 09/14/2017   GRAMSTAIN  09/10/2017    MODERATE WBC PRESENT,BOTH PMN AND MONONUCLEAR NO ORGANISMS SEEN    CULT  09/14/2017    NO GROWTH 5 DAYS Performed at [facility], [place] [address], [place], [place] [address]      Lab Results  Component Value Date   ALBUMIN 3.2 (L) 09/14/2017   ALBUMIN 4.3 09/09/2017   ALBUMIN 4.4 02/13/2016   LABURIC 9.0 (H) 09/09/2017   LABURIC 10.2 (H) 02/13/2016   LABURIC 9.5 (H) 12/06/2014    There is no height or weight on file to calculate BMI.  Orders:  No orders of the defined types were placed in this encounter.  No orders of the defined types were placed in this encounter.    Procedures: No procedures performed  Clinical Data: No additional findings.  ROS:  All other systems negative, except as noted in the HPI. Review of Systems  Objective: Vital Signs: There were no vitals taken for this visit.  Specialty Comments:  No specialty comments available.  PMFS History: Patient Active Problem List   Diagnosis Date Noted  . Cutaneous abscess of right foot   .  Septic arthritis ([place]) 09/09/2017  . Atypical angina ([place]) 11/08/2015  . Acute diastolic heart failure ([place])   . CAD S/P percutaneous coronary angioplasty   . Chronic atrial fibrillation ([place])   . CKD (chronic kidney disease), stage III ([place])   . Persistent atrial fibrillation ([place])   . Hypertension   . Hyperlipidemia   . Bradycardia   . Glucose intolerance (impaired glucose tolerance) 10/22/2015  . Back pain 05/24/2015  . Triceps tendonitis 05/24/2015  . ETD (eustachian tube dysfunction) 05/24/2015  . Hyperglycemia 12/18/2014  . Dysphagia, pharyngoesophageal phase 09/25/2014  . History of esophageal stricture 09/25/2014  . Chronic anticoagulation  09/25/2014  . Helicobacter pylori duodenitis 09/25/2014  . Benign paroxysmal positional vertigo 03/14/2014  . Encounter for therapeutic drug monitoring 04/12/2013  . Esophageal stricture from GERD 06/08/2011  . Advance care planning 04/20/2011  . OSA on CPAP 09/22/2010  . HYPERCHOLESTEROLEMIA 08/15/2008  . Gout 08/15/2008  . OBESITY 08/15/2008  . Essential hypertension 08/15/2008  . CAD (coronary artery disease), native coronary artery 08/15/2008  . GASTROESOPHAGEAL REFLUX DISEASE 08/15/2008   Past Medical History:  Diagnosis Date  . Bradycardia    a. nocturnal with pauses overnight on tele likely due to OSA 10/2015.  [name] CKD (chronic kidney disease), stage III ([place])   . Coronary artery disease    a. prev nonobst. b. LHC 10/24/15: occlusion of small diffusely diseased OM2 s/p balloon angioplasty, mild nonobstructive disease of mLAD and mRCA, normal LVEDP  . Diverticulosis   . Esophageal reflux   . Esophageal stricture from GERD 06/08/2011   With stricture at GE junction on EGD, dilated 06/2011   . Gastric ulcer    on EGD 2013  . Gout, unspecified    "on daily RX" (09/09/2017)  . History of blood transfusion 02/1964   "when I had right little finger cut off"  . Hyperlipidemia   . Hypertension   . Obesity, unspecified   . Orthopnea   . OSA on CPAP   . Paroxysmal nocturnal dyspnea   . Persistent atrial fibrillation ([place])   . Vertigo     Family History  Problem Relation Age of Onset  . Stomach cancer Mother        died in 39's   . Hypertension Father        died in his 78's pna likely dementia  . Dementia Father   . Pneumonia Father   . Heart disease Brother        S/P CABG  . Hypertension Other   . Prostate cancer Neg Hx   . Colon cancer Neg Hx     Past Surgical History:  Procedure Laterality Date  . BALLOON DILATION N/A 10/22/2014   Procedure: BALLOON DILATION;  Surgeon: [name], MD;  [facility]: [facility];  Service: Endoscopy;  Laterality: N/A;  . CARDIAC  CATHETERIZATION  07/2008  . CARDIAC CATHETERIZATION N/A 10/24/2015   Procedure: Left Heart Cath and Coronary Angiography;  Surgeon: [name], MD;  Location: [facility];  Service: Cardiovascular;  Laterality: N/A;  . CARDIAC CATHETERIZATION N/A 10/24/2015   Procedure: Coronary Balloon Angioplasty;  Surgeon: [name], MD;  Location: [facility];  Service: Cardiovascular;  Laterality: N/A;  . Carotid Dopplers  09/2009   no sig extracranial stenosis and vertebral arteries had antegrade flow  . CATARACT EXTRACTION W/ INTRAOCULAR LENS  IMPLANT, BILATERAL Bilateral   . ESOPHAGOGASTRODUODENOSCOPY (EGD) WITH ESOPHAGEAL DILATION     "I've had it stretched 3 times" (10/23/2015)  .  ESOPHAGOGASTRODUODENOSCOPY (EGD) WITH ESOPHAGEAL DILATION  06/2011   /medical hx above  (09/09/2017)  . ESOPHAGOGASTRODUODENOSCOPY (EGD) WITH PROPOFOL N/A 10/22/2014   Procedure: ESOPHAGOGASTRODUODENOSCOPY (EGD) WITH PROPOFOL;  Surgeon: [name], MD;  [facility];  Service: Endoscopy;  Laterality: N/A;  . FINGER SURGERY Right 02/1964   "cut little finger off; had it reattached"  . I&D EXTREMITY Right 09/10/2017   Procedure: RIGHT FOOT DEBRIDEMENT;  Surgeon: [name], MD;  Location: [facility];  Service: Orthopedics;  Laterality: Right;  . MRI of brain  09/2009   chronic microvascular ischemia  . OSA Sleep Study     per Dr. Brett Fairy  . SHOULDER ARTHROSCOPY W/ ROTATOR CUFF REPAIR Right 2008   Social History   Occupational History  . 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 Smoking status: Former Smoker    Packs/day: 0.12    Years: 4.00    Pack years: 0.48    Types: Cigarettes    Last attempt to quit: 03/16/1969    Years since quitting: 48.5  . Smokeless tobacco: Former Systems developer  . Tobacco comment: "chewed when I smoked; smoked 1 pack/week for about 4 years, quit 1971  Substance and Sexual Activity  . Alcohol use: Not Currently     Alcohol/week: 0.0 oz    Comment: 09/09/2017  "Quit all alcohol in 1970's"  . Drug use: Never  . Sexual activity: Not on file

## 2017-09-24 ENCOUNTER — Telehealth: Payer: Self-pay | Admitting: Family Medicine

## 2017-09-24 ENCOUNTER — Telehealth (INDEPENDENT_AMBULATORY_CARE_PROVIDER_SITE_OTHER): Payer: Self-pay | Admitting: Orthopedic Surgery

## 2017-09-24 DIAGNOSIS — L03031 Cellulitis of right toe: Secondary | ICD-10-CM | POA: Diagnosis not present

## 2017-09-24 DIAGNOSIS — I130 Hypertensive heart and chronic kidney disease with heart failure and stage 1 through stage 4 chronic kidney disease, or unspecified chronic kidney disease: Secondary | ICD-10-CM | POA: Diagnosis not present

## 2017-09-24 NOTE — Telephone Encounter (Signed)
Agreed, I'll await the clarification.  Please check with them next week if they don't call back.  Thanks.

## 2017-09-24 NOTE — Telephone Encounter (Signed)
Unable to reach [name] with [facility] OT dept to see if verbal orders requested are as typed in previous note asking for 0 x a week for 1 week. Left v/m requesting cb by [name] OT.

## 2017-09-24 NOTE — Telephone Encounter (Signed)
Patient's daughter Lawerance Bach called advised the sock is to tight to put on patient's right foot. Tabatha asked for a call back before she try to put the sock on. She said she have some questions concerning the sock.  The number to contact Tabatha is [phone]

## 2017-09-24 NOTE — Telephone Encounter (Signed)
I called and sw pt's daughter and she sttes that the pt is not keeping his foot elevated higher than his heart. Advised that he needs to do this to keep swelling to a minimum and will help the incision to heal. Concerned that the sock would stick to his stitches and that it would pull away good tissue. Advised that there should be no barrier between the incision and the sock it will not remove good tissue. Voiced understanding and will call with any questions.

## 2017-09-24 NOTE — Telephone Encounter (Signed)
Copied from [place] [id number]. Topic: Quick Communication - See Telephone Encounter >> Sep 24, 2017  3:42 PM Synthia Innocent wrote: CRM for notification. See Telephone encounter for: 09/24/17. Needing verbal order for OT, Plan of Care 1x a week  for 1week,  0x a week for 1 week, 1x a week for 2 weeks, ALDS, transfers and exercise. DC when goals met.

## 2017-09-27 ENCOUNTER — Ambulatory Visit (INDEPENDENT_AMBULATORY_CARE_PROVIDER_SITE_OTHER): Payer: Medicare Other | Admitting: Cardiology

## 2017-09-27 DIAGNOSIS — I481 Persistent atrial fibrillation: Secondary | ICD-10-CM | POA: Diagnosis not present

## 2017-09-27 DIAGNOSIS — I130 Hypertensive heart and chronic kidney disease with heart failure and stage 1 through stage 4 chronic kidney disease, or unspecified chronic kidney disease: Secondary | ICD-10-CM | POA: Diagnosis not present

## 2017-09-27 DIAGNOSIS — I4819 Other persistent atrial fibrillation: Secondary | ICD-10-CM

## 2017-09-27 DIAGNOSIS — Z5181 Encounter for therapeutic drug level monitoring: Secondary | ICD-10-CM

## 2017-09-27 DIAGNOSIS — L03031 Cellulitis of right toe: Secondary | ICD-10-CM | POA: Diagnosis not present

## 2017-09-27 LAB — POCT INR: INR: 2.1 (ref 2.0–3.0)

## 2017-09-27 NOTE — Telephone Encounter (Signed)
Copied from [place] [id number]. Topic: Quick Communication - See Telephone Encounter >> Sep 27, 2017 10:46 AM Antonieta Iba C wrote: CRM for notification. See Telephone encounter for: 09/27/17.  Frequency is correct - 1 time a week for 1 week --- 0 times a week for 1 week, 1 time a week for 2 weeks.   Timmothy Sours - OT [place]

## 2017-09-27 NOTE — Telephone Encounter (Signed)
Please give the order.  Thanks.   

## 2017-09-27 NOTE — Telephone Encounter (Signed)
Left detailed message on voicemail of Jamey Ripa to clarify verbal orders.

## 2017-09-27 NOTE — Patient Instructions (Signed)
Description   Spoke with Buzz RN with [facility] while in the home with the pt and instructed to have pt resume normal dose of 4mg  daily, except 6mg  each Monday and Thursdays (bactrim stopped). Recheck INR in 1 week.

## 2017-09-27 NOTE — Telephone Encounter (Signed)
Left detailed message on voicemail of [name] with [facility].

## 2017-09-28 DIAGNOSIS — I130 Hypertensive heart and chronic kidney disease with heart failure and stage 1 through stage 4 chronic kidney disease, or unspecified chronic kidney disease: Secondary | ICD-10-CM | POA: Diagnosis not present

## 2017-09-28 DIAGNOSIS — L03031 Cellulitis of right toe: Secondary | ICD-10-CM | POA: Diagnosis not present

## 2017-10-01 DIAGNOSIS — I130 Hypertensive heart and chronic kidney disease with heart failure and stage 1 through stage 4 chronic kidney disease, or unspecified chronic kidney disease: Secondary | ICD-10-CM | POA: Diagnosis not present

## 2017-10-01 DIAGNOSIS — L03031 Cellulitis of right toe: Secondary | ICD-10-CM | POA: Diagnosis not present

## 2017-10-04 ENCOUNTER — Ambulatory Visit (INDEPENDENT_AMBULATORY_CARE_PROVIDER_SITE_OTHER): Payer: Medicare Other | Admitting: Internal Medicine

## 2017-10-04 DIAGNOSIS — L03031 Cellulitis of right toe: Secondary | ICD-10-CM | POA: Diagnosis not present

## 2017-10-04 DIAGNOSIS — I4819 Other persistent atrial fibrillation: Secondary | ICD-10-CM

## 2017-10-04 DIAGNOSIS — Z5181 Encounter for therapeutic drug level monitoring: Secondary | ICD-10-CM | POA: Diagnosis not present

## 2017-10-04 DIAGNOSIS — I130 Hypertensive heart and chronic kidney disease with heart failure and stage 1 through stage 4 chronic kidney disease, or unspecified chronic kidney disease: Secondary | ICD-10-CM | POA: Diagnosis not present

## 2017-10-04 DIAGNOSIS — I481 Persistent atrial fibrillation: Secondary | ICD-10-CM | POA: Diagnosis not present

## 2017-10-04 LAB — POCT INR: INR: 2.4 (ref 2.0–3.0)

## 2017-10-06 ENCOUNTER — Ambulatory Visit (INDEPENDENT_AMBULATORY_CARE_PROVIDER_SITE_OTHER): Payer: Medicare Other | Admitting: Family

## 2017-10-06 ENCOUNTER — Encounter (INDEPENDENT_AMBULATORY_CARE_PROVIDER_SITE_OTHER): Payer: Self-pay | Admitting: Family

## 2017-10-06 DIAGNOSIS — M1A0710 Idiopathic chronic gout, right ankle and foot, without tophus (tophi): Secondary | ICD-10-CM

## 2017-10-06 DIAGNOSIS — M1A00X0 Idiopathic chronic gout, unspecified site, without tophus (tophi): Secondary | ICD-10-CM | POA: Insufficient documentation

## 2017-10-06 MED ORDER — MUPIROCIN 2 % EX OINT: 1.0000 "application " | TOPICAL_OINTMENT | Freq: Every day | CUTANEOUS | 6 refills | Status: DC

## 2017-10-06 NOTE — Progress Notes (Signed)
Office Visit Note   Patient: [name]           Date of Birth: 07-27-1930           MRN: [id number] Visit Date: 10/06/2017              Requested by: Tonia Ghent, MD [place], [place] [address] PCP: Tonia Ghent, MD  No chief complaint on file.     HPI: Patient is an 84 year old gentleman status post debridement for infected gout right great toe MTP joint.  Has been in [place] for walking around home. Doing dry dressings. Purchased Vive stockings, is unable to don these.  Assessment & Plan: Visit Diagnoses:  No diagnosis found.  Plan: Continue the Darco shoe. Minimize weight bearing. mupirocin dressings daily with Dial soap cleansing.  Harvest the sutures at follow-up.  Follow-Up Instructions: No follow-ups on file.   Ortho Exam  Patient is alert, oriented, no adenopathy, well-dressed, normal affect, normal respiratory effort. Examination patient has no clinical active gout of the right great toe.  Incision is well healed proximally, to the center of the incision has area of gaping, a length of 15 mm, gaped open about 2 mm.  There is good healthy granulation tissue at the base with compression. I cannot express any tophaceous material or infection there is no tenderness to palpation.  Imaging: No results found. No images are attached to the encounter.  Labs: Lab Results  Component Value Date   HGBA1C  07/31/2008    6.1 (NOTE) The ADA recommends the following therapeutic goal for glycemic control related to Hgb A1c measurement: Goal of therapy: <6.5 Hgb A1c  Reference: American Diabetes Association: Clinical Practice Recommendations 2010, Diabetes Care, 2010, 33: (Suppl  1).   ESRSEDRATE 17 (H) 09/09/2017   CRP 1.1 (H) 09/09/2017   LABURIC 9.0 (H) 09/09/2017   LABURIC 10.2 (H) 02/13/2016   LABURIC 9.5 (H) 12/06/2014   REPTSTATUS 09/19/2017 FINAL 09/14/2017   GRAMSTAIN  09/10/2017    MODERATE WBC PRESENT,BOTH PMN AND MONONUCLEAR NO  ORGANISMS SEEN    CULT  09/14/2017    NO GROWTH 5 DAYS Performed at [facility], [place] [address] [place],  [address]      Lab Results  Component Value Date   ALBUMIN 3.2 (L) 09/14/2017   ALBUMIN 4.3 09/09/2017   ALBUMIN 4.4 02/13/2016   LABURIC 9.0 (H) 09/09/2017   LABURIC 10.2 (H) 02/13/2016   LABURIC 9.5 (H) 12/06/2014    There is no height or weight on file to calculate BMI.  Orders:  No orders of the defined types were placed in this encounter.  No orders of the defined types were placed in this encounter.    Procedures: No procedures performed  Clinical Data: No additional findings.  ROS:  All other systems negative, except as noted in the HPI. Review of Systems  Constitutional: Negative for chills and fever.    Objective: Vital Signs: There were no vitals taken for this visit.  Specialty Comments:  No specialty comments available.  PMFS History: Patient Active Problem List   Diagnosis Date Noted  . Cutaneous abscess of right foot   . Septic arthritis ([place]) 09/09/2017  . Atypical angina ([place]) 11/08/2015  . Acute diastolic heart failure ([place])   . CAD S/P percutaneous coronary angioplasty   . Chronic atrial fibrillation ([place])   . CKD (chronic kidney disease), stage III ([place])   . Persistent atrial fibrillation ([place])   .  Hypertension   . Hyperlipidemia   . Bradycardia   . Glucose intolerance (impaired glucose tolerance) 10/22/2015  . Back pain 05/24/2015  . Triceps tendonitis 05/24/2015  . ETD (eustachian tube dysfunction) 05/24/2015  . Hyperglycemia 12/18/2014  . Dysphagia, pharyngoesophageal phase 09/25/2014  . History of esophageal stricture 09/25/2014  . Chronic anticoagulation 09/25/2014  . Helicobacter pylori duodenitis 09/25/2014  . Benign paroxysmal positional vertigo 03/14/2014  . Encounter for therapeutic drug monitoring 04/12/2013  . Esophageal stricture from GERD 06/08/2011  . Advance care planning 04/20/2011  . OSA  on CPAP 09/22/2010  . HYPERCHOLESTEROLEMIA 08/15/2008  . Gout 08/15/2008  . OBESITY 08/15/2008  . Essential hypertension 08/15/2008  . CAD (coronary artery disease), native coronary artery 08/15/2008  . GASTROESOPHAGEAL REFLUX DISEASE 08/15/2008   Past Medical History:  Diagnosis Date  . Bradycardia    a. nocturnal with pauses overnight on tele likely due to OSA 10/2015.  [name] CKD (chronic kidney disease), stage III ([place])   . Coronary artery disease    a. prev nonobst. b. LHC 10/24/15: occlusion of small diffusely diseased OM2 s/p balloon angioplasty, mild nonobstructive disease of mLAD and mRCA, normal LVEDP  . Diverticulosis   . Esophageal reflux   . Esophageal stricture from GERD 06/08/2011   With stricture at GE junction on EGD, dilated 06/2011   . Gastric ulcer    on EGD 2013  . Gout, unspecified    "on daily RX" (09/09/2017)  . History of blood transfusion 02/1964   "when I had right little finger cut off"  . Hyperlipidemia   . Hypertension   . Obesity, unspecified   . Orthopnea   . OSA on CPAP   . Paroxysmal nocturnal dyspnea   . Persistent atrial fibrillation ([place])   . Vertigo     Family History  Problem Relation Age of Onset  . Stomach cancer Mother        died in 24's   . Hypertension Father        died in his 70's pna likely dementia  . Dementia Father   . Pneumonia Father   . Heart disease Brother        S/P CABG  . Hypertension Other   . Prostate cancer Neg Hx   . Colon cancer Neg Hx     Past Surgical History:  Procedure Laterality Date  . BALLOON DILATION N/A 10/22/2014   Procedure: BALLOON DILATION;  Surgeon: [name], MD;  [facility];  Service: Endoscopy;  Laterality: N/A;  . CARDIAC CATHETERIZATION  07/2008  . CARDIAC CATHETERIZATION N/A 10/24/2015   Procedure: Left Heart Cath and Coronary Angiography;  Surgeon: [name], MD;  Location: [facility];  Service: Cardiovascular;  Laterality: N/A;  . CARDIAC CATHETERIZATION N/A  10/24/2015   Procedure: Coronary Balloon Angioplasty;  Surgeon: [name], MD;  Location: [facility];  Service: Cardiovascular;  Laterality: N/A;  . Carotid Dopplers  09/2009   no sig extracranial stenosis and vertebral arteries had antegrade flow  . CATARACT EXTRACTION W/ INTRAOCULAR LENS  IMPLANT, BILATERAL Bilateral   . ESOPHAGOGASTRODUODENOSCOPY (EGD) WITH ESOPHAGEAL DILATION     "I've had it stretched 3 times" (10/23/2015)  . ESOPHAGOGASTRODUODENOSCOPY (EGD) WITH ESOPHAGEAL DILATION  06/2011   /medical hx above  (09/09/2017)  . ESOPHAGOGASTRODUODENOSCOPY (EGD) WITH PROPOFOL N/A 10/22/2014   Procedure: ESOPHAGOGASTRODUODENOSCOPY (EGD) WITH PROPOFOL;  Surgeon: [name], MD;  [facility];  Service: Endoscopy;  Laterality: N/A;  . FINGER SURGERY Right 02/1964   "  cut little finger off; had it reattached"  . I&D EXTREMITY Right 09/10/2017   Procedure: RIGHT FOOT DEBRIDEMENT;  Surgeon: [name], MD;  Location: [facility];  Service: Orthopedics;  Laterality: Right;  . MRI of brain  09/2009   chronic microvascular ischemia  . OSA Sleep Study     per Dr. Brett Fairy  . SHOULDER ARTHROSCOPY W/ ROTATOR CUFF REPAIR Right 2008   Social History   Occupational History  . 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 Smoking status: Former Smoker    Packs/day: 0.12    Years: 4.00    Pack years: 0.48    Types: Cigarettes    Last attempt to quit: 03/16/1969    Years since quitting: 48.5  . Smokeless tobacco: Former Systems developer  . Tobacco comment: "chewed when I smoked; smoked 1 pack/week for about 4 years, quit 1971  Substance and Sexual Activity  . Alcohol use: Not Currently    Alcohol/week: 0.0 oz    Comment: 09/09/2017  "Quit all alcohol in 1970's"  . Drug use: Never  . Sexual activity: Not on file

## 2017-10-07 DIAGNOSIS — I130 Hypertensive heart and chronic kidney disease with heart failure and stage 1 through stage 4 chronic kidney disease, or unspecified chronic kidney disease: Secondary | ICD-10-CM | POA: Diagnosis not present

## 2017-10-07 DIAGNOSIS — L03031 Cellulitis of right toe: Secondary | ICD-10-CM | POA: Diagnosis not present

## 2017-10-14 ENCOUNTER — Other Ambulatory Visit: Payer: Self-pay

## 2017-10-14 DIAGNOSIS — Z7901 Long term (current) use of anticoagulants: Secondary | ICD-10-CM

## 2017-10-14 DIAGNOSIS — Z9981 Dependence on supplemental oxygen: Secondary | ICD-10-CM

## 2017-10-14 DIAGNOSIS — I130 Hypertensive heart and chronic kidney disease with heart failure and stage 1 through stage 4 chronic kidney disease, or unspecified chronic kidney disease: Secondary | ICD-10-CM | POA: Diagnosis not present

## 2017-10-14 DIAGNOSIS — I5032 Chronic diastolic (congestive) heart failure: Secondary | ICD-10-CM | POA: Diagnosis not present

## 2017-10-14 DIAGNOSIS — Z5181 Encounter for therapeutic drug level monitoring: Secondary | ICD-10-CM

## 2017-10-14 DIAGNOSIS — Z87891 Personal history of nicotine dependence: Secondary | ICD-10-CM

## 2017-10-14 DIAGNOSIS — N183 Chronic kidney disease, stage 3 (moderate): Secondary | ICD-10-CM | POA: Diagnosis not present

## 2017-10-14 DIAGNOSIS — E669 Obesity, unspecified: Secondary | ICD-10-CM

## 2017-10-14 DIAGNOSIS — L03031 Cellulitis of right toe: Secondary | ICD-10-CM | POA: Diagnosis not present

## 2017-10-14 DIAGNOSIS — K5790 Diverticulosis of intestine, part unspecified, without perforation or abscess without bleeding: Secondary | ICD-10-CM

## 2017-10-14 DIAGNOSIS — E785 Hyperlipidemia, unspecified: Secondary | ICD-10-CM

## 2017-10-14 DIAGNOSIS — I2510 Atherosclerotic heart disease of native coronary artery without angina pectoris: Secondary | ICD-10-CM

## 2017-10-14 DIAGNOSIS — M10371 Gout due to renal impairment, right ankle and foot: Secondary | ICD-10-CM

## 2017-10-14 DIAGNOSIS — Z9181 History of falling: Secondary | ICD-10-CM

## 2017-10-14 DIAGNOSIS — G4733 Obstructive sleep apnea (adult) (pediatric): Secondary | ICD-10-CM

## 2017-10-14 DIAGNOSIS — I482 Chronic atrial fibrillation: Secondary | ICD-10-CM

## 2017-10-14 DIAGNOSIS — K219 Gastro-esophageal reflux disease without esophagitis: Secondary | ICD-10-CM

## 2017-10-15 ENCOUNTER — Other Ambulatory Visit: Payer: Self-pay | Admitting: Family Medicine

## 2017-10-18 ENCOUNTER — Ambulatory Visit (INDEPENDENT_AMBULATORY_CARE_PROVIDER_SITE_OTHER): Payer: Medicare Other | Admitting: Internal Medicine

## 2017-10-18 DIAGNOSIS — I4819 Other persistent atrial fibrillation: Secondary | ICD-10-CM

## 2017-10-18 DIAGNOSIS — I481 Persistent atrial fibrillation: Secondary | ICD-10-CM | POA: Diagnosis not present

## 2017-10-18 DIAGNOSIS — L03031 Cellulitis of right toe: Secondary | ICD-10-CM | POA: Diagnosis not present

## 2017-10-18 DIAGNOSIS — I130 Hypertensive heart and chronic kidney disease with heart failure and stage 1 through stage 4 chronic kidney disease, or unspecified chronic kidney disease: Secondary | ICD-10-CM | POA: Diagnosis not present

## 2017-10-18 DIAGNOSIS — Z5181 Encounter for therapeutic drug level monitoring: Secondary | ICD-10-CM | POA: Diagnosis not present

## 2017-10-18 LAB — POCT INR: INR: 2.3 (ref 2.0–3.0)

## 2017-10-18 NOTE — Patient Instructions (Signed)
Description   Spoke with Buzz RN with [facility] while in the home with the pt and instructed to have pt to continue taking 4mg  daily, except 6mg  each Monday and Thursdays. Recheck INR in 3 weeks.

## 2017-10-26 ENCOUNTER — Ambulatory Visit (INDEPENDENT_AMBULATORY_CARE_PROVIDER_SITE_OTHER): Payer: Medicare Other | Admitting: Orthopedic Surgery

## 2017-10-26 ENCOUNTER — Encounter (INDEPENDENT_AMBULATORY_CARE_PROVIDER_SITE_OTHER): Payer: Self-pay | Admitting: Orthopedic Surgery

## 2017-10-26 DIAGNOSIS — M1A0710 Idiopathic chronic gout, right ankle and foot, without tophus (tophi): Secondary | ICD-10-CM

## 2017-10-26 NOTE — Progress Notes (Signed)
Office Visit Note   Patient: [name]           Date of Birth: 20-Dec-1930           MRN: [id number] Visit Date: 10/26/2017              Requested by: Tonia Ghent, MD [address] [place], [place] [address] PCP: Tonia Ghent, MD   Assessment & Plan: Visit Diagnoses: No diagnosis found.  Plan: Importance of elevation protected weightbearing and follow-up to remove the sutures.  Follow-Up Instructions: No follow-ups on file.   Orders:  No orders of the defined types were placed in this encounter.  No orders of the defined types were placed in this encounter.     Procedures: No procedures performed   Clinical Data: No additional findings.   Subjective: No chief complaint on file.   HPI [name] is an 84 year old male who is seen for postoperative follow-up following right great toe debridement with partial excision of the first metatarsal head, excision of the capsule and debridement of right great toe abscess with gout.  Cultures from the operating room showed no growth.  He is now approximately 6 weeks postop.  At last visit he was to begin Bactroban dressing changes following daily Dial soap cleansing.  He was to minimize weightbearing and continue the Darco shoe. Review of Systems   Objective: Vital Signs: There were no vitals taken for this visit.  Physical Exam examination patient has swelling there is tension on the sutures.  There is some redness but no odor no drainage no ascending cellulitis.  Symptoms seem to be more from swelling than infection.  Ortho Exam  Specialty Comments:  No specialty comments available.  Imaging: No results found.   PMFS History: Patient Active Problem List   Diagnosis Date Noted  . Idiopathic chronic gout, unspecified site, without tophus (tophi) 10/06/2017  . Cutaneous abscess of right foot   . Septic arthritis ([place]) 09/09/2017  . Atypical angina ([place]) 11/08/2015  . Acute diastolic heart  failure ([place])   . CAD S/P percutaneous coronary angioplasty   . Chronic atrial fibrillation ([place])   . CKD (chronic kidney disease), stage III ([place])   . Persistent atrial fibrillation ([place])   . Hypertension   . Hyperlipidemia   . Bradycardia   . Glucose intolerance (impaired glucose tolerance) 10/22/2015  . Back pain 05/24/2015  . Triceps tendonitis 05/24/2015  . ETD (eustachian tube dysfunction) 05/24/2015  . Hyperglycemia 12/18/2014  . Dysphagia, pharyngoesophageal phase 09/25/2014  . History of esophageal stricture 09/25/2014  . Chronic anticoagulation 09/25/2014  . Helicobacter pylori duodenitis 09/25/2014  . Benign paroxysmal positional vertigo 03/14/2014  . Encounter for therapeutic drug monitoring 04/12/2013  . Esophageal stricture from GERD 06/08/2011  . Advance care planning 04/20/2011  . OSA on CPAP 09/22/2010  . HYPERCHOLESTEROLEMIA 08/15/2008  . Gout 08/15/2008  . OBESITY 08/15/2008  . Essential hypertension 08/15/2008  . CAD (coronary artery disease), native coronary artery 08/15/2008  . GASTROESOPHAGEAL REFLUX DISEASE 08/15/2008   Past Medical History:  Diagnosis Date  . Bradycardia    a. nocturnal with pauses overnight on tele likely due to OSA 10/2015.  [name] CKD (chronic kidney disease), stage III ([place])   . Coronary artery disease    a. prev nonobst. b. LHC 10/24/15: occlusion of small diffusely diseased OM2 s/p balloon angioplasty, mild nonobstructive disease of mLAD and mRCA, normal LVEDP  . Diverticulosis   . Esophageal reflux   .  Esophageal stricture from GERD 06/08/2011   With stricture at GE junction on EGD, dilated 06/2011   . Gastric ulcer    on EGD 2013  . Gout, unspecified    "on daily RX" (09/09/2017)  . History of blood transfusion 02/1964   "when I had right little finger cut off"  . Hyperlipidemia   . Hypertension   . Obesity, unspecified   . Orthopnea   . OSA on CPAP   . Paroxysmal nocturnal dyspnea   . Persistent atrial fibrillation ([place])     . Vertigo     Family History  Problem Relation Age of Onset  . Stomach cancer Mother        died in 64's   . Hypertension Father        died in his 67's pna likely dementia  . Dementia Father   . Pneumonia Father   . Heart disease Brother        S/P CABG  . Hypertension Other   . Prostate cancer Neg Hx   . Colon cancer Neg Hx     Past Surgical History:  Procedure Laterality Date  . BALLOON DILATION N/A 10/22/2014   Procedure: BALLOON DILATION;  Surgeon: [name], MD;  [facility];  Service: Endoscopy;  Laterality: N/A;  . CARDIAC CATHETERIZATION  07/2008  . CARDIAC CATHETERIZATION N/A 10/24/2015   Procedure: Left Heart Cath and Coronary Angiography;  Surgeon: [name], MD;  Location: [facility];  Service: Cardiovascular;  Laterality: N/A;  . CARDIAC CATHETERIZATION N/A 10/24/2015   Procedure: Coronary Balloon Angioplasty;  Surgeon: [name], MD;  Location: [facility];  Service: Cardiovascular;  Laterality: N/A;  . Carotid Dopplers  09/2009   no sig extracranial stenosis and vertebral arteries had antegrade flow  . CATARACT EXTRACTION W/ INTRAOCULAR LENS  IMPLANT, BILATERAL Bilateral   . ESOPHAGOGASTRODUODENOSCOPY (EGD) WITH ESOPHAGEAL DILATION     "I've had it stretched 3 times" (10/23/2015)  . ESOPHAGOGASTRODUODENOSCOPY (EGD) WITH ESOPHAGEAL DILATION  06/2011   /medical hx above  (09/09/2017)  . ESOPHAGOGASTRODUODENOSCOPY (EGD) WITH PROPOFOL N/A 10/22/2014   Procedure: ESOPHAGOGASTRODUODENOSCOPY (EGD) WITH PROPOFOL;  Surgeon: [name], MD;  [facility];  Service: Endoscopy;  Laterality: N/A;  . FINGER SURGERY Right 02/1964   "cut little finger off; had it reattached"  . I&D EXTREMITY Right 09/10/2017   Procedure: RIGHT FOOT DEBRIDEMENT;  Surgeon: [name], MD;  Location: [facility];  Service: Orthopedics;  Laterality: Right;  . MRI of brain  09/2009   chronic microvascular ischemia  . OSA Sleep Study     per Dr. Brett Fairy   . SHOULDER ARTHROSCOPY W/ ROTATOR CUFF REPAIR Right 2008   Social History   Occupational History  . 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 Smoking status: Former Smoker    Packs/day: 0.12    Years: 4.00    Pack years: 0.48    Types: Cigarettes    Last attempt to quit: 03/16/1969    Years since quitting: 48.6  . Smokeless tobacco: Former Systems developer  . Tobacco comment: "chewed when I smoked; smoked 1 pack/week for about 4 years, quit 1971  Substance and Sexual Activity  . Alcohol use: Not Currently    Alcohol/week: 0.0 standard drinks    Comment: 09/09/2017  "Quit all alcohol in 1970's"  . Drug use: Never  . Sexual activity: Not on file

## 2017-10-27 ENCOUNTER — Telehealth: Payer: Self-pay | Admitting: Family Medicine

## 2017-10-27 ENCOUNTER — Ambulatory Visit (INDEPENDENT_AMBULATORY_CARE_PROVIDER_SITE_OTHER): Payer: Medicare Other | Admitting: Orthopedic Surgery

## 2017-10-27 NOTE — Telephone Encounter (Signed)
Copied from [place] [phone] [id number]. Topic: Inquiry >> Oct 27, 2017  1:43 PM Mylinda Latina, NT wrote: Reason for CRM: Patient daughter called and states that she has a questions if her dad is a diabetic. He has an appt next Thursday. She is wondering if lab results can be ordered before the appt. She would like an A1C done if possible . Please call if and when the lab is ordered to schedule an appt. CB# [phone]

## 2017-10-27 NOTE — Telephone Encounter (Signed)
I would prefer to see patient first and see about what labs need to be done.  We can do them at the OV, even if not fasting.  That may be best.   He has h/o elevated sugar, but not to the point of being diabetic, in the past.   Thanks.

## 2017-10-28 NOTE — Telephone Encounter (Signed)
Yes, please have them remind me at the [facility].  Thanks.

## 2017-10-28 NOTE — Telephone Encounter (Signed)
Tabitha advised.  Daughter also asks if his PT/INR could be done here and reported so that the patient would not have to go to the other office on Monday, 11/08/17.

## 2017-10-28 NOTE — Telephone Encounter (Signed)
Reminder placed in appointment notes.

## 2017-11-01 ENCOUNTER — Telehealth (INDEPENDENT_AMBULATORY_CARE_PROVIDER_SITE_OTHER): Payer: Self-pay

## 2017-11-01 ENCOUNTER — Encounter (INDEPENDENT_AMBULATORY_CARE_PROVIDER_SITE_OTHER): Payer: Self-pay | Admitting: Orthopedic Surgery

## 2017-11-01 ENCOUNTER — Ambulatory Visit (INDEPENDENT_AMBULATORY_CARE_PROVIDER_SITE_OTHER): Payer: Medicare Other | Admitting: Orthopedic Surgery

## 2017-11-01 VITALS — Ht 66.0 in | Wt 208.0 lb

## 2017-11-01 DIAGNOSIS — I87323 Chronic venous hypertension (idiopathic) with inflammation of bilateral lower extremity: Secondary | ICD-10-CM

## 2017-11-01 DIAGNOSIS — E1142 Type 2 diabetes mellitus with diabetic polyneuropathy: Secondary | ICD-10-CM

## 2017-11-01 DIAGNOSIS — M1A0710 Idiopathic chronic gout, right ankle and foot, without tophus (tophi): Secondary | ICD-10-CM | POA: Diagnosis not present

## 2017-11-01 MED ORDER — MUPIROCIN 2 % EX OINT: 1.0000 "application " | TOPICAL_OINTMENT | Freq: Two times a day (BID) | CUTANEOUS | 3 refills | Status: DC

## 2017-11-01 NOTE — Telephone Encounter (Signed)
Patients daughter called and states patient has blisters on right foot. Stitches were taken out 10/26/17. She states blisters are not near incision not sure if shoe rubbed or if the tape that they used caused this. She has been putting mupirocin ointment but is out now. She would like to know does she continue doing this or should he be seen?  CB [phone]  (if he needs to be seen daughter states she cannot bring him Thurs @11 :30 nor Wed-has other appts-FYI)

## 2017-11-01 NOTE — Telephone Encounter (Signed)
Can you please open appt time at 2pm for work in apt for this pt.

## 2017-11-01 NOTE — Progress Notes (Signed)
Office Visit Note   Patient: [name]           Date of Birth: Feb 06, 1931           MRN: [id number] Visit Date: 11/01/2017              Requested by: Tonia Ghent, MD [address] [place], [place] [address] PCP: Tonia Ghent, MD  Chief Complaint  Patient presents with  . Right Foot - Routine Post Op    09/10/17 right foot debridement       HPI: Patient is a 84 year old gentleman who presents with increasing swelling and blistering of the right foot he is status post debridement of the right great toe MTP joint for chronic gout.  Patient's wife states she is checked his glucose and is been running in the 140s to 150s fasting in the morning.  Patient recently went to a race got back at 3 AM and had increased blistering over his foot where the tape was holding a dressing on his foot he has increased venous stasis swelling.  Assessment & Plan: Visit Diagnoses:  1. Diabetic polyneuropathy associated with type 2 diabetes mellitus ([place])   2. Chronic idiopathic gout involving toe of right foot without tophus   3. Idiopathic chronic venous hypertension of both lower extremities with inflammation     Plan: Patient will follow-up with his primary care physician to evaluate and treat his underlying diabetes.  Patient will wear his medical compression stockings around-the-clock we will call in a prescription for Bactroban to apply to the blisters.  Discussed the importance of elevation compression and using the calf pump to decrease the swelling.  Follow-Up Instructions: Return in about 4 weeks (around 11/29/2017).   Ortho Exam  Patient is alert, oriented, no adenopathy, well-dressed, normal affect, normal respiratory effort. Examination patient has venous stasis swelling of the left lower extremity there is no ascending cellulitis his surgical incision has healed well he has blistering over the dorsum of the foot secondary to the tape from wearing his dressing.  There is  no drainage no cellulitis.  Imaging: No results found. No images are attached to the encounter.  Labs: Lab Results  Component Value Date   HGBA1C  07/31/2008    6.1 (NOTE) The ADA recommends the following therapeutic goal for glycemic control related to Hgb A1c measurement: Goal of therapy: <6.5 Hgb A1c  Reference: American Diabetes Association: Clinical Practice Recommendations 2010, Diabetes Care, 2010, 33: (Suppl  1).   ESRSEDRATE 17 (H) 09/09/2017   CRP 1.1 (H) 09/09/2017   LABURIC 9.0 (H) 09/09/2017   LABURIC 10.2 (H) 02/13/2016   LABURIC 9.5 (H) 12/06/2014   REPTSTATUS 09/19/2017 FINAL 09/14/2017   GRAMSTAIN  09/10/2017    MODERATE WBC PRESENT,BOTH PMN AND MONONUCLEAR NO ORGANISMS SEEN    CULT  09/14/2017    NO GROWTH 5 DAYS Performed at [facility], [place] [address] [place], [place] [address]      Lab Results  Component Value Date   ALBUMIN 3.2 (L) 09/14/2017   ALBUMIN 4.3 09/09/2017   ALBUMIN 4.4 02/13/2016   LABURIC 9.0 (H) 09/09/2017   LABURIC 10.2 (H) 02/13/2016   LABURIC 9.5 (H) 12/06/2014    Body mass index is 33.57 kg/m.  Orders:  No orders of the defined types were placed in this encounter.  No orders of the defined types were placed in this encounter.    Procedures: No procedures performed  Clinical Data: No  additional findings.  ROS:  All other systems negative, except as noted in the HPI. Review of Systems  Objective: Vital Signs: Ht 5\' 6"  (1.676 m)   Wt 208 lb (94.3 kg)   BMI 33.57 kg/m   Specialty Comments:  No specialty comments available.  PMFS History: Patient Active Problem List   Diagnosis Date Noted  . Idiopathic chronic gout, unspecified site, without tophus (tophi) 10/06/2017  . Cutaneous abscess of right foot   . Septic arthritis ([place]) 09/09/2017  . Atypical angina ([place]) 11/08/2015  . Acute diastolic heart failure ([place])   . CAD S/P percutaneous coronary angioplasty   . Chronic atrial fibrillation ([place])     . CKD (chronic kidney disease), stage III ([place])   . Persistent atrial fibrillation ([place])   . Hypertension   . Hyperlipidemia   . Bradycardia   . Glucose intolerance (impaired glucose tolerance) 10/22/2015  . Back pain 05/24/2015  . Triceps tendonitis 05/24/2015  . ETD (eustachian tube dysfunction) 05/24/2015  . Hyperglycemia 12/18/2014  . Dysphagia, pharyngoesophageal phase 09/25/2014  . History of esophageal stricture 09/25/2014  . Chronic anticoagulation 09/25/2014  . Helicobacter pylori duodenitis 09/25/2014  . Benign paroxysmal positional vertigo 03/14/2014  . Encounter for therapeutic drug monitoring 04/12/2013  . Esophageal stricture from GERD 06/08/2011  . Advance care planning 04/20/2011  . OSA on CPAP 09/22/2010  . HYPERCHOLESTEROLEMIA 08/15/2008  . Gout 08/15/2008  . OBESITY 08/15/2008  . Essential hypertension 08/15/2008  . CAD (coronary artery disease), native coronary artery 08/15/2008  . GASTROESOPHAGEAL REFLUX DISEASE 08/15/2008   Past Medical History:  Diagnosis Date  . Bradycardia    a. nocturnal with pauses overnight on tele likely due to OSA 10/2015.  [name] CKD (chronic kidney disease), stage III ([place])   . Coronary artery disease    a. prev nonobst. b. LHC 10/24/15: occlusion of small diffusely diseased OM2 s/p balloon angioplasty, mild nonobstructive disease of mLAD and mRCA, normal LVEDP  . Diverticulosis   . Esophageal reflux   . Esophageal stricture from GERD 06/08/2011   With stricture at GE junction on EGD, dilated 06/2011   . Gastric ulcer    on EGD 2013  . Gout, unspecified    "on daily RX" (09/09/2017)  . History of blood transfusion 02/1964   "when I had right little finger cut off"  . Hyperlipidemia   . Hypertension   . Obesity, unspecified   . Orthopnea   . OSA on CPAP   . Paroxysmal nocturnal dyspnea   . Persistent atrial fibrillation ([place])   . Vertigo     Family History  Problem Relation Age of Onset  . Stomach cancer Mother        died  in 85's   . Hypertension Father        died in his 69's pna likely dementia  . Dementia Father   . Pneumonia Father   . Heart disease Brother        S/P CABG  . Hypertension Other   . Prostate cancer Neg Hx   . Colon cancer Neg Hx     Past Surgical History:  Procedure Laterality Date  . BALLOON DILATION N/A 10/22/2014   Procedure: BALLOON DILATION;  Surgeon: [name], MD;  [facility];  Service: Endoscopy;  Laterality: N/A;  . CARDIAC CATHETERIZATION  07/2008  . CARDIAC CATHETERIZATION N/A 10/24/2015   Procedure: Left Heart Cath and Coronary Angiography;  Surgeon: [name], MD;  Location: [facility];  Service: Cardiovascular;  Laterality: N/A;  . CARDIAC CATHETERIZATION N/A 10/24/2015   Procedure: Coronary Balloon Angioplasty;  Surgeon: [name], MD;  Location: [facility];  Service: Cardiovascular;  Laterality: N/A;  . Carotid Dopplers  09/2009   no sig extracranial stenosis and vertebral arteries had antegrade flow  . CATARACT EXTRACTION W/ INTRAOCULAR LENS  IMPLANT, BILATERAL Bilateral   . ESOPHAGOGASTRODUODENOSCOPY (EGD) WITH ESOPHAGEAL DILATION     "I've had it stretched 3 times" (10/23/2015)  . ESOPHAGOGASTRODUODENOSCOPY (EGD) WITH ESOPHAGEAL DILATION  06/2011   /medical hx above  (09/09/2017)  . ESOPHAGOGASTRODUODENOSCOPY (EGD) WITH PROPOFOL N/A 10/22/2014   Procedure: ESOPHAGOGASTRODUODENOSCOPY (EGD) WITH PROPOFOL;  Surgeon: [name], MD;  [facility];  Service: Endoscopy;  Laterality: N/A;  . FINGER SURGERY Right 02/1964   "cut little finger off; had it reattached"  . I&D EXTREMITY Right 09/10/2017   Procedure: RIGHT FOOT DEBRIDEMENT;  Surgeon: [name], MD;  Location: [facility];  Service: Orthopedics;  Laterality: Right;  . MRI of brain  09/2009   chronic microvascular ischemia  . OSA Sleep Study     per Dr. Brett Fairy  . SHOULDER ARTHROSCOPY W/ ROTATOR CUFF REPAIR Right 2008   Social History   Occupational History    . 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 Smoking status: Former Smoker    Packs/day: 0.12    Years: 4.00    Pack years: 0.48    Types: Cigarettes    Last attempt to quit: 03/16/1969    Years since quitting: 48.6  . Smokeless tobacco: Former Systems developer  . Tobacco comment: "chewed when I smoked; smoked 1 pack/week for about 4 years, quit 1971  Substance and Sexual Activity  . Alcohol use: Not Currently    Alcohol/week: 0.0 standard drinks    Comment: 09/09/2017  "Quit all alcohol in 1970's"  . Drug use: Never  . Sexual activity: Not on file

## 2017-11-04 ENCOUNTER — Encounter: Payer: Self-pay | Admitting: Family Medicine

## 2017-11-04 ENCOUNTER — Ambulatory Visit (INDEPENDENT_AMBULATORY_CARE_PROVIDER_SITE_OTHER): Payer: Medicare Other | Admitting: Family Medicine

## 2017-11-04 VITALS — BP 108/62 | HR 97 | Temp 98.6°F | Ht 66.0 in | Wt 213.5 lb

## 2017-11-04 DIAGNOSIS — I4819 Other persistent atrial fibrillation: Secondary | ICD-10-CM

## 2017-11-04 DIAGNOSIS — I2510 Atherosclerotic heart disease of native coronary artery without angina pectoris: Secondary | ICD-10-CM | POA: Diagnosis not present

## 2017-11-04 DIAGNOSIS — M109 Gout, unspecified: Secondary | ICD-10-CM | POA: Diagnosis not present

## 2017-11-04 DIAGNOSIS — R739 Hyperglycemia, unspecified: Secondary | ICD-10-CM

## 2017-11-04 DIAGNOSIS — I482 Chronic atrial fibrillation, unspecified: Secondary | ICD-10-CM

## 2017-11-04 DIAGNOSIS — I481 Persistent atrial fibrillation: Secondary | ICD-10-CM

## 2017-11-04 DIAGNOSIS — E119 Type 2 diabetes mellitus without complications: Secondary | ICD-10-CM | POA: Diagnosis not present

## 2017-11-04 LAB — PROTIME-INR
INR: 2.4 ratio — AB (ref 0.8–1.0)
PROTHROMBIN TIME: 27.6 s — AB (ref 9.6–13.1)

## 2017-11-04 LAB — URIC ACID: URIC ACID, SERUM: 9.3 mg/dL — AB (ref 4.0–7.8)

## 2017-11-04 LAB — BASIC METABOLIC PANEL
BUN: 18 mg/dL (ref 6–23)
CALCIUM: 10 mg/dL (ref 8.4–10.5)
CO2: 28 mEq/L (ref 19–32)
Chloride: 100 mEq/L (ref 96–112)
Creatinine, Ser: 1.38 mg/dL (ref 0.40–1.50)
GFR: 52.07 mL/min — AB (ref >60.00)
GLUCOSE: 108 mg/dL — AB (ref 70–99)
Potassium: 4 mEq/L (ref 3.5–5.1)
SODIUM: 138 meq/L (ref 135–145)

## 2017-11-04 LAB — HEMOGLOBIN A1C: HEMOGLOBIN A1C: 6.7 % — AB (ref 4.6–6.5)

## 2017-11-04 MED ORDER — COLCHICINE 0.6 MG PO TABS: ORAL_TABLET | ORAL | Status: DC

## 2017-11-04 NOTE — Patient Instructions (Addendum)
Go to the lab on the way out.  We'll contact you with your lab report. Take care.  Glad to see you.  Update me as needed.   

## 2017-11-04 NOTE — Progress Notes (Signed)
Taking colchicine once daily.  No sx now.  We talked about gout diet.  Repeat labs pending.  See notes on labs.  He had previous blistering noted on the foot after he had been standing up a long time at a recent car race.  Has been bandaging it and treating it supportively otherwise in the meantime.  No bleeding.  Still on coumadin.  Taking 4mg  tabs, total of 8 tabs= 32mg  in a week.  No bleeding or bruising, other than usual bruising on the arms.    He has derm f/u pending.    Family has been checking sugar at home, 130-150 at home fasting.  No formal dx Dm2 prev.  "I eat all I can get my hands on" but d/w pt about cutting some carbs on principle. He has some doughnuts for breakfast.  Discussed substitutions.    PMH and SH reviewed  ROS: Per HPI unless specifically indicated in ROS section   Meds, vitals, and allergies reviewed.   nad ncat Mmm Neck supple, no LA IRR.   ctab R foot healing at incision site.  Resolving blistering from prev bandage.  Doesn't appear infected.

## 2017-11-05 ENCOUNTER — Ambulatory Visit (INDEPENDENT_AMBULATORY_CARE_PROVIDER_SITE_OTHER): Payer: Medicare Other | Admitting: Pharmacist

## 2017-11-05 ENCOUNTER — Telehealth: Payer: Self-pay

## 2017-11-05 ENCOUNTER — Telehealth: Payer: Self-pay | Admitting: Cardiovascular Disease

## 2017-11-05 DIAGNOSIS — I481 Persistent atrial fibrillation: Secondary | ICD-10-CM | POA: Diagnosis not present

## 2017-11-05 DIAGNOSIS — I4819 Other persistent atrial fibrillation: Secondary | ICD-10-CM

## 2017-11-05 DIAGNOSIS — Z5181 Encounter for therapeutic drug level monitoring: Secondary | ICD-10-CM

## 2017-11-05 NOTE — Patient Instructions (Signed)
Description   INR drawn at PCP yesterday - called pt and advised him to continue taking 4mg  daily, except 6mg  each Monday and Thursdays. Recheck INR in 4 weeks.

## 2017-11-05 NOTE — Telephone Encounter (Signed)
Patient's daughter advised. (DPR)

## 2017-11-05 NOTE — Telephone Encounter (Signed)
Copied from [place] [facility] [phone]. Topic: General - Other >> Nov 05, 2017  3:27 PM Keene Breath wrote: Reason for CRM: Patient called to get the results of all the labs from nurse or doctor.  Please call when they are in.  CB# [phone].

## 2017-11-05 NOTE — Telephone Encounter (Signed)
New Message:  Patient wife calling about IR results

## 2017-11-05 NOTE — Telephone Encounter (Signed)
INR is 2.4.  Would continue coumadin as is.  I'll update the coumadin clinic so they can contact him about follow up.  A1c 6.7.  He is diabetic based on that but doesn't need medicine.   Uric acid up at 9.3 but kidney function better on this recheck.  I need to consider options and I'll send notice via result note in the near future but this should suffice for the weekend.  Thanks.

## 2017-11-05 NOTE — Telephone Encounter (Signed)
Will dose INR drawn at PCP yesterday in separate anticoag encounter.

## 2017-11-07 ENCOUNTER — Other Ambulatory Visit: Payer: Self-pay | Admitting: Family Medicine

## 2017-11-07 ENCOUNTER — Encounter: Payer: Self-pay | Admitting: Family Medicine

## 2017-11-07 DIAGNOSIS — M109 Gout, unspecified: Secondary | ICD-10-CM

## 2017-11-07 MED ORDER — ALLOPURINOL 100 MG PO TABS: 100.0000 mg | ORAL_TABLET | Freq: Every day | ORAL | 3 refills | Status: DC

## 2017-11-07 NOTE — Assessment & Plan Note (Signed)
Clearly improved from before.  The incision site appears to be healing well.  The previous blistering is healing up.  Does not appear infected.  See notes on labs.  No change in meds at this point.

## 2017-11-07 NOTE — Assessment & Plan Note (Signed)
Discussed with patient about dietary substitutions to lower his carbohydrate intake.  Check A1c today.  See notes on labs.  Okay for outpatient follow-up. >25 minutes spent in face to face time with patient, >50% spent in counselling or coordination of care.

## 2017-11-07 NOTE — Assessment & Plan Note (Signed)
Still on Coumadin.  Taking a total of 32 mg/week.  No abnormal bleeding or bruising.  See notes on labs.

## 2017-11-10 ENCOUNTER — Encounter (INDEPENDENT_AMBULATORY_CARE_PROVIDER_SITE_OTHER): Payer: Self-pay | Admitting: Orthopedic Surgery

## 2017-11-23 ENCOUNTER — Other Ambulatory Visit: Payer: Self-pay | Admitting: Cardiovascular Disease

## 2017-12-02 ENCOUNTER — Ambulatory Visit (INDEPENDENT_AMBULATORY_CARE_PROVIDER_SITE_OTHER): Payer: Medicare Other | Admitting: Physician Assistant

## 2017-12-02 ENCOUNTER — Telehealth: Payer: Self-pay

## 2017-12-02 ENCOUNTER — Encounter (INDEPENDENT_AMBULATORY_CARE_PROVIDER_SITE_OTHER): Payer: Self-pay | Admitting: Orthopedic Surgery

## 2017-12-02 VITALS — Ht 66.0 in | Wt 213.5 lb

## 2017-12-02 DIAGNOSIS — E1142 Type 2 diabetes mellitus with diabetic polyneuropathy: Secondary | ICD-10-CM

## 2017-12-02 DIAGNOSIS — I87323 Chronic venous hypertension (idiopathic) with inflammation of bilateral lower extremity: Secondary | ICD-10-CM

## 2017-12-02 DIAGNOSIS — M1A0710 Idiopathic chronic gout, right ankle and foot, without tophus (tophi): Secondary | ICD-10-CM

## 2017-12-02 NOTE — Telephone Encounter (Signed)
PLEASE NOTE: All timestamps contained within this report are represented as [place] Standard Time. CONFIDENTIALTY NOTICE: This fax transmission is intended only for the addressee. It contains information that is legally privileged, confidential or otherwise protected from use or disclosure. If you are not the intended recipient, you are strictly prohibited from reviewing, disclosing, copying using or disseminating any of this information or taking any action in reliance on or regarding this information. If you have received this fax in error, please notify us immediately by telephone so that we can arrange for its return to [place]. Phone: [phone], Toll-Free: [phone], Fax: [phone] Page: 1 of 1 Call Id: [id number] [place] Night - Client Nonclinical Telephone Record [place] Night - Client Client Site [facility] Physician Renford Dills - MD Contact Type Call Who Is Calling Patient / Member / Family / Caregiver Caller Name Carolyne Littles Caller Phone Number [phone] Patient Name [name] Patient DOB 12/30/1933 Call Type Message Only Information Provided Reason for Call Request to Schedule Office Appointment Initial Comment Caller wants to make an appointment this afternoon for her father. Additional Comment Office hours were provided. Call Closed By: Jaclyn Prime Transaction Date/Time: 12/02/2017 6:51:47 AM (ET)

## 2017-12-02 NOTE — Telephone Encounter (Signed)
pts daughter, Izora Gala notified as instructed and voiced understanding. Izora Gala said she thinks his temp last night was 101 but she will talk with her sister to see what temp is this morning. If > 100.4 will take to UC this afternoon. Pt has appt this morning with surgeon. Izora Gala does not want appt on 12/03/17 with Dr Damita Dunnings cancelled. FYI to Dr Damita Dunnings.

## 2017-12-02 NOTE — Telephone Encounter (Signed)
I spoke with pts daughter and offered appt for today but not fit family's schedule; appt scheduled 12/03/17 at 10:30 with Dr Damita Dunnings for ? Gout lt elbow and fever.

## 2017-12-02 NOTE — Telephone Encounter (Signed)
Noted. Thanks.

## 2017-12-02 NOTE — Progress Notes (Signed)
Office Visit Note   Patient: [name]           Date of Birth: 11-Apr-1930           MRN: [id number] Visit Date: 12/02/2017              Requested by: Tonia Ghent, MD [id number] [place], [place] [address] PCP: Tonia Ghent, MD  Chief Complaint  Patient presents with  . Right Foot - Follow-up  . Left Elbow - Follow-up, Pain      HPI: The patient is an 84 year old male who presents for postoperative follow-up following debridement of the right great toe MTP joint for chronic gout.  He reports that his right great toe is doing much better but over the past several days he developed increasing redness and swelling about his left elbow.  This was examined by Dr. Sharol Given and he likely does have gout here as well.  The patient has not taken any colchicine for this and Dr.Duda advised him to resume some colchicine 0.6 mg daily.  We also recommend he increase his allopurinol 200 mg twice daily.   The patient further reports that he has had some foul-smelling urine and reports that he has had a fever to 100.7.  He is seeing his primary care provider tomorrow and likely has a urinary tract infection and it we did advise them to call and let the office of the primary care provider know that he is having urinary symptoms with foul odor in his urine and fever and they may want to start him on something sooner rather than later and could obtain a urine culture prior to starting the antibiotic.  The patient also complains of pain over the bottom of both feet and we have recommended compression socks but he continues not to wear these and we reinforced that a need for compression stockings with the patient.   Assessment & Plan: Visit Diagnoses:  1. Chronic idiopathic gout involving toe of right foot without tophus   2. Diabetic polyneuropathy associated with type 2 diabetes mellitus ([place])   3. Idiopathic chronic venous hypertension of both lower extremities with inflammation      Plan: Patient will start on colchicine 0.6 mg p.o. daily.  He will increase his allopurinol to 100 mg p.o. twice daily.  Recommend follow-up with the PCP concerning possible urinary tract infection.Recommend compression stockings except for when showering.  Follow-up in 2 weeks.  Follow-Up Instructions: Return in about 2 weeks (around 12/16/2017).   Ortho Exam  Patient is alert, oriented, no adenopathy, well-dressed, normal affect, normal respiratory effort. The patient's right great toe edema is markedly improved.  At the erythema is resolved.  Discomfort to palpation is resolved.  His range of motion is improved overall.  He does have pitting edema of the lower extremities.  Left elbow shows swelling over the olecranon bursa and some erythema over this area as well as tenderness to palpation.  Imaging: No results found. No images are attached to the encounter.  Labs: Lab Results  Component Value Date   HGBA1C 6.7 (H) 11/04/2017   HGBA1C  07/31/2008    6.1 (NOTE) The ADA recommends the following therapeutic goal for glycemic control related to Hgb A1c measurement: Goal of therapy: <6.5 Hgb A1c  Reference: American Diabetes Association: Clinical Practice Recommendations 2010, Diabetes Care, 2010, 33: (Suppl  1).   ESRSEDRATE 17 (H) 09/09/2017   CRP 1.1 (H) 09/09/2017   LABURIC 9.3 (H)  11/04/2017   LABURIC 9.0 (H) 09/09/2017   LABURIC 10.2 (H) 02/13/2016   REPTSTATUS 09/19/2017 FINAL 09/14/2017   GRAMSTAIN  09/10/2017    MODERATE WBC PRESENT,BOTH PMN AND MONONUCLEAR NO ORGANISMS SEEN    CULT  09/14/2017    NO GROWTH 5 DAYS Performed at [facility], [place] [address] [place], [place] [address]      Lab Results  Component Value Date   ALBUMIN 3.2 (L) 09/14/2017   ALBUMIN 4.3 09/09/2017   ALBUMIN 4.4 02/13/2016   LABURIC 9.3 (H) 11/04/2017   LABURIC 9.0 (H) 09/09/2017   LABURIC 10.2 (H) 02/13/2016    Body mass index is 34.46 kg/m.  Orders:  No orders of the  defined types were placed in this encounter.  No orders of the defined types were placed in this encounter.    Procedures: No procedures performed  Clinical Data: No additional findings.  ROS:  All other systems negative, except as noted in the HPI. Review of Systems  Objective: Vital Signs: Ht 5\' 6"  (1.676 m)   Wt 213 lb 8 oz (96.8 kg)   BMI 34.46 kg/m   Specialty Comments:  No specialty comments available.  PMFS History: Patient Active Problem List   Diagnosis Date Noted  . Idiopathic chronic gout, unspecified site, without tophus (tophi) 10/06/2017  . Cutaneous abscess of right foot   . Septic arthritis ([place]) 09/09/2017  . Atypical angina ([place]) 11/08/2015  . Acute diastolic heart failure ([place])   . CAD S/P percutaneous coronary angioplasty   . Chronic atrial fibrillation ([place])   . CKD (chronic kidney disease), stage III ([place])   . Persistent atrial fibrillation ([place])   . Hypertension   . Hyperlipidemia   . Bradycardia   . Back pain 05/24/2015  . Triceps tendonitis 05/24/2015  . Diabetes mellitus without complication ([place]) 01/75/1025  . Dysphagia, pharyngoesophageal phase 09/25/2014  . History of esophageal stricture 09/25/2014  . Chronic anticoagulation 09/25/2014  . Helicobacter pylori duodenitis 09/25/2014  . Benign paroxysmal positional vertigo 03/14/2014  . Encounter for therapeutic drug monitoring 04/12/2013  . Esophageal stricture from GERD 06/08/2011  . Advance care planning 04/20/2011  . OSA on CPAP 09/22/2010  . HYPERCHOLESTEROLEMIA 08/15/2008  . Gout 08/15/2008  . OBESITY 08/15/2008  . Essential hypertension 08/15/2008  . CAD (coronary artery disease), native coronary artery 08/15/2008  . GASTROESOPHAGEAL REFLUX DISEASE 08/15/2008   Past Medical History:  Diagnosis Date  . Bradycardia    a. nocturnal with pauses overnight on tele likely due to OSA 10/2015.  [name] CKD (chronic kidney disease), stage III ([place])   . Coronary artery disease    a. prev  nonobst. b. LHC 10/24/15: occlusion of small diffusely diseased OM2 s/p balloon angioplasty, mild nonobstructive disease of mLAD and mRCA, normal LVEDP  . Diabetes mellitus without complication ([place])   . Diverticulosis   . Esophageal reflux   . Esophageal stricture from GERD 06/08/2011   With stricture at GE junction on EGD, dilated 06/2011   . Gastric ulcer    on EGD 2013  . Gout, unspecified    "on daily RX" (09/09/2017)  . History of blood transfusion 02/1964   "when I had right little finger cut off"  . Hyperlipidemia   . Hypertension   . Obesity, unspecified   . Orthopnea   . OSA on CPAP   . Paroxysmal nocturnal dyspnea   . Persistent atrial fibrillation ([place])   . Vertigo     Family History  Problem Relation Age  of Onset  . Stomach cancer Mother        died in 48's   . Hypertension Father        died in his 23's pna likely dementia  . Dementia Father   . Pneumonia Father   . Heart disease Brother        S/P CABG  . Hypertension Other   . Prostate cancer Neg Hx   . Colon cancer Neg Hx     Past Surgical History:  Procedure Laterality Date  . BALLOON DILATION N/A 10/22/2014   Procedure: BALLOON DILATION;  Surgeon: [name], MD;  Location: [facility];  Service: Endoscopy;  Laterality: N/A;  . CARDIAC CATHETERIZATION  07/2008  . CARDIAC CATHETERIZATION N/A 10/24/2015   Procedure: Left Heart Cath and Coronary Angiography;  Surgeon: [name], MD;  Location: [facility];  Service: Cardiovascular;  Laterality: N/A;  . CARDIAC CATHETERIZATION N/A 10/24/2015   Procedure: Coronary Balloon Angioplasty;  Surgeon: [name], MD;  Location: [facility];  Service: Cardiovascular;  Laterality: N/A;  . Carotid Dopplers  09/2009   no sig extracranial stenosis and vertebral arteries had antegrade flow  . CATARACT EXTRACTION W/ INTRAOCULAR LENS  IMPLANT, BILATERAL Bilateral   . ESOPHAGOGASTRODUODENOSCOPY (EGD) WITH ESOPHAGEAL DILATION     "I've had it  stretched 3 times" (10/23/2015)  . ESOPHAGOGASTRODUODENOSCOPY (EGD) WITH ESOPHAGEAL DILATION  06/2011   /medical hx above  (09/09/2017)  . ESOPHAGOGASTRODUODENOSCOPY (EGD) WITH PROPOFOL N/A 10/22/2014   Procedure: ESOPHAGOGASTRODUODENOSCOPY (EGD) WITH PROPOFOL;  Surgeon: [name], MD;  Location: [facility];  Service: Endoscopy;  Laterality: N/A;  . FINGER SURGERY Right 02/1964   "cut little finger off; had it reattached"  . I&D EXTREMITY Right 09/10/2017   Procedure: RIGHT FOOT DEBRIDEMENT;  Surgeon: [name], MD;  Location: [facility];  Service: Orthopedics;  Laterality: Right;  . MRI of brain  09/2009   chronic microvascular ischemia  . OSA Sleep Study     per Dr. Brett Fairy  . SHOULDER ARTHROSCOPY W/ ROTATOR CUFF REPAIR Right 2008   Social History   Occupational History  . 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 Smoking status: Former Smoker    Packs/day: 0.12    Years: 4.00    Pack years: 0.48    Types: Cigarettes    Last attempt to quit: 03/16/1969    Years since quitting: 48.7  . Smokeless tobacco: Former Systems developer  . Tobacco comment: "chewed when I smoked; smoked 1 pack/week for about 4 years, quit 1971  Substance and Sexual Activity  . Alcohol use: Not Currently    Alcohol/week: 0.0 standard drinks    Comment: 09/09/2017  "Quit all alcohol in 1970's"  . Drug use: Never  . Sexual activity: Not on file

## 2017-12-02 NOTE — Telephone Encounter (Signed)
If he has a temp >100.4 or feels systemically ill, then needs to be seen today, either here or UC.  Thanks.

## 2017-12-03 ENCOUNTER — Ambulatory Visit (INDEPENDENT_AMBULATORY_CARE_PROVIDER_SITE_OTHER): Payer: Medicare Other | Admitting: Family Medicine

## 2017-12-03 ENCOUNTER — Encounter: Payer: Self-pay | Admitting: Family Medicine

## 2017-12-03 VITALS — BP 126/80 | HR 92 | Temp 98.3°F | Ht 66.0 in | Wt 214.5 lb

## 2017-12-03 DIAGNOSIS — I2510 Atherosclerotic heart disease of native coronary artery without angina pectoris: Secondary | ICD-10-CM | POA: Diagnosis not present

## 2017-12-03 DIAGNOSIS — M7662 Achilles tendinitis, left leg: Secondary | ICD-10-CM

## 2017-12-03 DIAGNOSIS — M7661 Achilles tendinitis, right leg: Secondary | ICD-10-CM

## 2017-12-03 DIAGNOSIS — R509 Fever, unspecified: Secondary | ICD-10-CM | POA: Diagnosis not present

## 2017-12-03 DIAGNOSIS — M109 Gout, unspecified: Secondary | ICD-10-CM | POA: Diagnosis not present

## 2017-12-03 LAB — POC URINALSYSI DIPSTICK (AUTOMATED)
Bilirubin, UA: NEGATIVE
Blood, UA: NEGATIVE
Glucose, UA: NEGATIVE
Ketones, UA: NEGATIVE
Leukocytes, UA: NEGATIVE
Nitrite, UA: NEGATIVE
Protein, UA: NEGATIVE
Spec Grav, UA: 1.015
Urobilinogen, UA: 0.2 U/dL
pH, UA: 6

## 2017-12-03 LAB — BASIC METABOLIC PANEL
BUN: 18 mg/dL (ref 6–23)
CO2: 29 mEq/L (ref 19–32)
CREATININE: 1.33 mg/dL (ref 0.40–1.50)
Calcium: 9.4 mg/dL (ref 8.4–10.5)
Chloride: 101 mEq/L (ref 96–112)
GFR: 54.32 mL/min — AB (ref >60.00)
Glucose, Bld: 104 mg/dL — ABNORMAL HIGH (ref 70–99)
Potassium: 4.2 mEq/L (ref 3.5–5.1)
Sodium: 139 mEq/L (ref 135–145)

## 2017-12-03 LAB — CBC WITH DIFFERENTIAL/PLATELET
BASOS PCT: 2.6 % (ref 0.0–3.0)
Basophils Absolute: 0.2 10*3/uL — ABNORMAL HIGH (ref 0.0–0.1)
EOS PCT: 1.9 % (ref 0.0–5.0)
Eosinophils Absolute: 0.2 10*3/uL (ref 0.0–0.7)
HEMATOCRIT: 46.1 % (ref 39.0–52.0)
Hemoglobin: 15.7 g/dL (ref 13.0–17.0)
Lymphocytes Relative: 18.7 % (ref 12.0–46.0)
Lymphs Abs: 1.6 10*3/uL (ref 0.7–4.0)
MCHC: 34.1 g/dL (ref 30.0–36.0)
MCV: 86.7 fl (ref 78.0–100.0)
Monocytes Absolute: 1.2 10*3/uL — ABNORMAL HIGH (ref 0.1–1.0)
Monocytes Relative: 14.5 % — ABNORMAL HIGH (ref 3.0–12.0)
NEUTROS ABS: 5.2 10*3/uL (ref 1.4–7.7)
Neutrophils Relative %: 62.3 % (ref 43.0–77.0)
RBC: 5.32 Mil/uL (ref 4.22–5.81)
RDW: 15.2 % (ref 11.5–15.5)
WBC: 8.4 10*3/uL (ref 4.0–10.5)

## 2017-12-03 LAB — URIC ACID: Uric Acid, Serum: 6.6 mg/dL (ref 4.0–7.8)

## 2017-12-03 MED ORDER — PREDNISONE 20 MG PO TABS: 20.0000 mg | ORAL_TABLET | Freq: Every day | ORAL | 0 refills | Status: DC

## 2017-12-03 NOTE — Patient Instructions (Addendum)
Go to the lab on the way out.  We'll contact you with your lab report. Use a heel lift/wedge in each shoe and that should help.  Gently stretch your calf.   Continue allopurinol daily and take colchicine twice a day as needed.  If your elbow doesn't continue to get better daily then start prednisone.  Update me as needed.   Take care.  Glad to see you.

## 2017-12-03 NOTE — Progress Notes (Signed)
Foot surgical site and prev blistering improved but he has B achilles pain at the end of the day, not plantar pain.  No trauma.  Symptoms are symmetric.  Temp up to 101 on two nights this week.  Recently with L elbow gout sx.  Taking colchicine BID prn, recently.  Still on allopurinol once a day.  Has seen the surgery clinic recently.  Elbow is better today, with restart of colchicine.  Still on coumadin.   Urine was darker and had an abnormal scent but no burning with urination.  He is drinking more water and had nocturia last night.  Urine lighter after more fluid intake.  Some soreness in the R side of abd.    PMH and SH reviewed  ROS: Per HPI unless specifically indicated in ROS section   Meds, vitals, and allergies reviewed.   nad ncat IRR CTAB abd soft, not ttp, normal BS, no cva pain Able to bear weight but B achilles ttp.  Not ttp on plantar side of feet.  Right surgical site well-healed. L elbow warm and tender to palpation locally but still has normal ROM.  Distally neurovascularly intact.

## 2017-12-04 LAB — URINE CULTURE
MICRO NUMBER: 91132465
SPECIMEN QUALITY:: ADEQUATE

## 2017-12-05 DIAGNOSIS — M7660 Achilles tendinitis, unspecified leg: Secondary | ICD-10-CM | POA: Insufficient documentation

## 2017-12-05 DIAGNOSIS — R509 Fever, unspecified: Secondary | ICD-10-CM | POA: Insufficient documentation

## 2017-12-05 NOTE — Assessment & Plan Note (Signed)
Unclear to me if the inflammatory response related to the gout flare is causing his fever.  His elbow is improved in the meantime, based on his report.  Still okay for outpatient follow-up. Continue allopurinol daily and take colchicine twice a day as needed.  If his elbow doesn't continue to get better daily then start prednisone.  Routine cautions given.  See notes on labs.

## 2017-12-05 NOTE — Assessment & Plan Note (Addendum)
Previously noted.  Afebrile now.  Still okay for outpatient follow-up.  His urine was darker but he did not have pain on urination.  Check urine culture.  See notes on labs.  Lungs are clear.   He agrees with plan.  Prev ortho notes reviewed.  >25 minutes spent in face to face time with patient, >50% spent in counselling or coordination of care.

## 2017-12-05 NOTE — Assessment & Plan Note (Signed)
He can use heel wedges/lifts.  Needs to gently stretch his Achilles/calf multiple times per day.  Discussed with patient.  Still okay for outpatient follow-up.

## 2017-12-06 ENCOUNTER — Ambulatory Visit: Payer: Medicare Other | Admitting: Family Medicine

## 2017-12-08 DIAGNOSIS — D485 Neoplasm of uncertain behavior of skin: Secondary | ICD-10-CM | POA: Diagnosis not present

## 2017-12-08 DIAGNOSIS — D225 Melanocytic nevi of trunk: Secondary | ICD-10-CM | POA: Diagnosis not present

## 2017-12-08 DIAGNOSIS — D2261 Melanocytic nevi of right upper limb, including shoulder: Secondary | ICD-10-CM | POA: Diagnosis not present

## 2017-12-08 DIAGNOSIS — L821 Other seborrheic keratosis: Secondary | ICD-10-CM | POA: Diagnosis not present

## 2017-12-08 DIAGNOSIS — L570 Actinic keratosis: Secondary | ICD-10-CM | POA: Diagnosis not present

## 2017-12-08 DIAGNOSIS — C44519 Basal cell carcinoma of skin of other part of trunk: Secondary | ICD-10-CM | POA: Diagnosis not present

## 2017-12-08 DIAGNOSIS — Z85828 Personal history of other malignant neoplasm of skin: Secondary | ICD-10-CM | POA: Diagnosis not present

## 2017-12-08 DIAGNOSIS — D2262 Melanocytic nevi of left upper limb, including shoulder: Secondary | ICD-10-CM | POA: Diagnosis not present

## 2017-12-08 DIAGNOSIS — L080 Pyoderma: Secondary | ICD-10-CM | POA: Diagnosis not present

## 2017-12-08 DIAGNOSIS — C44319 Basal cell carcinoma of skin of other parts of face: Secondary | ICD-10-CM | POA: Diagnosis not present

## 2017-12-16 ENCOUNTER — Ambulatory Visit (INDEPENDENT_AMBULATORY_CARE_PROVIDER_SITE_OTHER): Payer: Medicare Other | Admitting: Orthopedic Surgery

## 2017-12-16 ENCOUNTER — Ambulatory Visit (INDEPENDENT_AMBULATORY_CARE_PROVIDER_SITE_OTHER): Payer: Medicare Other | Admitting: Podiatry

## 2017-12-16 VITALS — BP 164/87

## 2017-12-16 DIAGNOSIS — B351 Tinea unguium: Secondary | ICD-10-CM | POA: Diagnosis not present

## 2017-12-16 DIAGNOSIS — E119 Type 2 diabetes mellitus without complications: Secondary | ICD-10-CM

## 2017-12-16 DIAGNOSIS — M2012 Hallux valgus (acquired), left foot: Secondary | ICD-10-CM | POA: Diagnosis not present

## 2017-12-16 DIAGNOSIS — M2042 Other hammer toe(s) (acquired), left foot: Secondary | ICD-10-CM | POA: Diagnosis not present

## 2017-12-16 DIAGNOSIS — M2041 Other hammer toe(s) (acquired), right foot: Secondary | ICD-10-CM | POA: Diagnosis not present

## 2017-12-16 DIAGNOSIS — M2011 Hallux valgus (acquired), right foot: Secondary | ICD-10-CM | POA: Diagnosis not present

## 2017-12-16 DIAGNOSIS — E1151 Type 2 diabetes mellitus with diabetic peripheral angiopathy without gangrene: Secondary | ICD-10-CM | POA: Diagnosis not present

## 2017-12-16 NOTE — Progress Notes (Signed)
Subjective:  Patient ID: [name], male    DOB: 1930-05-22,  MRN: [id number]  Chief Complaint  Patient presents with  . Foot Pain    Pt states heel pain caused by compression stockings. Pt discontinued compression stockings and pain has gone away completely.  . Foot Pain    Burning on bottom of feet.    84 y.o. male presents  for diabetic foot care. Last AMBS was 110. Denies numbness and tingling in their feet. Denies cramping in legs and thighs.  Review of Systems: Negative except as noted in the HPI. Denies N/V/F/Ch.  Past Medical History:  Diagnosis Date  . Bradycardia    a. nocturnal with pauses overnight on tele likely due to OSA 10/2015.  [name] CKD (chronic kidney disease), stage III ([place])   . Coronary artery disease    a. prev nonobst. b. LHC 10/24/15: occlusion of small diffusely diseased OM2 s/p balloon angioplasty, mild nonobstructive disease of mLAD and mRCA, normal LVEDP  . Diabetes mellitus without complication ([place])   . Diverticulosis   . Esophageal reflux   . Esophageal stricture from GERD 06/08/2011   With stricture at GE junction on EGD, dilated 06/2011   . Gastric ulcer    on EGD 2013  . Gout, unspecified    "on daily RX" (09/09/2017)  . History of blood transfusion 02/1964   "when I had right little finger cut off"  . Hyperlipidemia   . Hypertension   . Obesity, unspecified   . Orthopnea   . OSA on CPAP   . Paroxysmal nocturnal dyspnea   . Persistent atrial fibrillation ([place])   . Vertigo     Current Outpatient Medications:  .  acetaminophen (TYLENOL) 500 MG tablet, Take 1,000 mg by mouth as needed for mild pain., Disp: , Rfl:  .  allopurinol (ZYLOPRIM) 100 MG tablet, Take 1 tablet (100 mg total) by mouth daily., Disp: 90 tablet, Rfl: 3 .  Besifloxacin HCl (BESIVANCE) 0.6 % SUSP, Besivance 0.6 % eye drops,suspension, Disp: , Rfl:  .  bismuth-metronidazole-tetracycline (PYLERA) 140-125-125 MG capsule, Pylera 140 mg-125 mg-125 mg capsule, Disp: , Rfl:    .  cefUROXime (CEFTIN) 500 MG tablet, cefuroxime axetil 500 mg tablet, Disp: , Rfl:  .  chlorhexidine (PERIDEX) 0.12 % solution, chlorhexidine gluconate 0.12 % mouthwash, Disp: , Rfl:  .  clindamycin (CLEOCIN) 150 MG capsule, clindamycin HCl 150 mg capsule, Disp: , Rfl:  .  colchicine (COLCRYS) 0.6 MG tablet, TAKE 1 TABLET BY MOUTH 2 TIMES DAILY FOR A GOUT FLARE. TRY TO TAPER ONCE GOUT FLARE IS RESOLVED. (Patient not taking: Reported on 12/16/2017), Disp: , Rfl:  .  Difluprednate (DUREZOL) 0.05 % EMUL, Durezol 0.05 % eye drops, Disp: , Rfl:  .  diltiazem (CARDIZEM CD) 180 MG 24 hr capsule, TAKE 1 CAPSULE (180 MG TOTAL) BY MOUTH DAILY., Disp: 30 capsule, Rfl: 10 .  doxycycline (VIBRAMYCIN) 100 MG capsule, doxycycline hyclate 100 mg capsule, Disp: , Rfl:  .  fluocinonide cream (LIDEX) 0.05 %, fluocinonide 0.05 % topical cream, Disp: , Rfl:  .  fluticasone (FLONASE) 50 MCG/ACT nasal spray, Place 1-2 sprays into both nostrils daily as needed for allergies. , Disp: , Rfl:  .  furosemide (LASIX) 20 MG tablet, TAKE 1 TABLET (20 MG TOTAL) BY MOUTH DAILY., Disp: 30 tablet, Rfl: 10 .  HYDROcodone-acetaminophen (NORCO/VICODIN) 5-325 MG tablet, hydrocodone 5 mg-acetaminophen 325 mg tablet, Disp: , Rfl:  .  HYDROcodone-homatropine (HYCODAN) 5-1.5 MG/5ML syrup, hydrocodone-homatropine 5 mg-1.5 mg/5 mL oral  syrup, Disp: , Rfl:  .  Influenza vac split quadrivalent PF (FLUZONE HIGH-DOSE) 0.5 ML injection, Fluzone High-Dose 2015-16 (PF) 180 mcg/0.5 mL intramuscular syringe  TO BE ADMINISTERED BY PHARMACIST FOR IMMUNIZATION, Disp: , Rfl:  .  lisinopril (PRINIVIL,ZESTRIL) 20 MG tablet, TAKE 1 TABLET (20 MG TOTAL) BY MOUTH DAILY., Disp: 90 tablet, Rfl: 3 .  metoprolol tartrate (LOPRESSOR) 25 MG tablet, TAKE 1/2 TABLET BY MOUTH TWICE A DAY, Disp: 30 tablet, Rfl: 7 .  mupirocin ointment (BACTROBAN) 2 %, Apply 1 application topically 2 (two) times daily. Apply to the affected area 2 times a day, Disp: 22 g, Rfl: 3 .   nepafenac (ILEVRO) 0.3 % ophthalmic suspension, Ilevro 0.3 % eye drops,suspension  USE 1 DROP IN RIGHT EYE AT BEDTIME, Disp: , Rfl:  .  nitroGLYCERIN (NITROSTAT) 0.4 MG SL tablet, Place 1 tablet (0.4 mg total) under the tongue every 5 (five) minutes x 3 doses as needed for chest pain., Disp: 25 tablet, Rfl: 2 .  omeprazole (PRILOSEC) 40 MG capsule, TAKE ONE CAPSULE BY MOUTH EVERY DAY, Disp: 30 capsule, Rfl: 2 .  predniSONE (DELTASONE) 20 MG tablet, Take 1 tablet (20 mg total) by mouth daily with breakfast. (Patient not taking: Reported on 12/16/2017), Disp: 5 tablet, Rfl: 0 .  warfarin (COUMADIN) 4 MG tablet, TAKE AS DIRECTED BY COUMADIN CLINIC, Disp: 120 tablet, Rfl: 1  Social History   Tobacco Use  Smoking Status Former Smoker  . Packs/day: 0.12  . Years: 4.00  . Pack years: 0.48  . Types: Cigarettes  . Last attempt to quit: 03/16/1969  . Years since quitting: 48.7  Smokeless Tobacco Former Systems developer  Tobacco Comment   "chewed when I smoked; smoked 1 pack/week for about 4 years, quit 1971    Allergies  Allergen Reactions  . Penicillins Shortness Of Breath and Swelling    Patient tolerated Rocephin injection 2019  PATIENT HAS HAD A PCN REACTION WITH IMMEDIATE RASH, FACIAL/TONGUE/THROAT SWELLING, SOB, OR LIGHTHEADEDNESS WITH HYPOTENSION:  #  #  YES  #  Has patient had a PCN reaction causing severe rash involving mucus membranes or skin necrosis: NO Has patient had a PCN reaction that required hospitalization NO Has patient had a PCN reaction occurring within the last 10 years: NO If all of the above answers are "NO", then may proceed with Cephalosporin use.   . Atorvastatin Other (See Comments)    No energy, just felt bad    . Pravastatin Other (See Comments)    myalgias  . Uloric [Febuxostat] Other (See Comments)    Would avoid, worsening gout sx with use.   . Vancomycin Other (See Comments)    Red man syndrome- tolerated with slow run and benadryl use  . Biaxin [Clarithromycin]  Diarrhea  . Doxycycline Other (See Comments)    GI upset   Objective:   Vitals:   12/16/17 0956  BP: (!) 164/87   There is no height or weight on file to calculate BMI. Constitutional Well developed. Well nourished.  Vascular Dorsalis pedis pulses present 1+ bilaterally  Posterior tibial pulses absent bilaterally  Pedal hair growth absent. Capillary refill normal to all digits.  No cyanosis or clubbing noted.  Neurologic Normal speech. Oriented to person, place, and time. Epicritic sensation to light touch grossly present bilaterally. Protective sensation with 5.07 monofilament  present bilaterally. Vibratory sensation present bilaterally.  Dermatologic Nails elongated, thickened, dystrophic. No open wounds. No skin lesions.  Orthopedic: Normal joint ROM without pain or crepitus bilaterally. HAV  bilat Hammertoes bilat No bony tenderness.   Assessment:   1. Diabetes mellitus type 2 with peripheral artery disease ([place])   2. Encounter for diabetic foot exam ([place])   3. Onychomycosis   4. Acquired hallux valgus of both feet   5. Hammer toes of both feet    Plan:  Patient was evaluated and treated and all questions answered.  Diabetes with PAD, Onychomycosis -Educated on diabetic footcare. Diabetic risk level 1 -Nails x10 debrided sharply and manually with large nail nipper and rotary burr.  -We will make appointment for fabrication diabetic shoes  Procedure: Nail Debridement Rationale: Patient meets criteria for routine foot care due to PAD Type of Debridement: manual, sharp debridement. Instrumentation: Nail nipper, rotary burr. Number of Nails: 10    Return in about 3 months (around 03/18/2018) for Diabetic Foot Care.

## 2017-12-21 ENCOUNTER — Ambulatory Visit: Payer: Medicare Other | Admitting: Orthotics

## 2017-12-21 DIAGNOSIS — M2042 Other hammer toe(s) (acquired), left foot: Secondary | ICD-10-CM

## 2017-12-21 DIAGNOSIS — M2012 Hallux valgus (acquired), left foot: Secondary | ICD-10-CM

## 2017-12-21 DIAGNOSIS — M2041 Other hammer toe(s) (acquired), right foot: Secondary | ICD-10-CM

## 2017-12-21 DIAGNOSIS — E1151 Type 2 diabetes mellitus with diabetic peripheral angiopathy without gangrene: Secondary | ICD-10-CM

## 2017-12-21 DIAGNOSIS — M2011 Hallux valgus (acquired), right foot: Secondary | ICD-10-CM

## 2017-12-21 NOTE — Progress Notes (Signed)

## 2017-12-27 ENCOUNTER — Ambulatory Visit (INDEPENDENT_AMBULATORY_CARE_PROVIDER_SITE_OTHER): Payer: Medicare Other

## 2017-12-27 DIAGNOSIS — Z5181 Encounter for therapeutic drug level monitoring: Secondary | ICD-10-CM | POA: Diagnosis not present

## 2017-12-27 DIAGNOSIS — I4819 Other persistent atrial fibrillation: Secondary | ICD-10-CM | POA: Diagnosis not present

## 2017-12-27 LAB — POCT INR: INR: 2.7 (ref 2.0–3.0)

## 2017-12-27 NOTE — Patient Instructions (Signed)
Description   Continue on same dosage 1 tablet daily except 1.5 tablets each Mondays and Thursdays. Recheck INR in 6 weeks.

## 2018-01-19 ENCOUNTER — Telehealth: Payer: Self-pay | Admitting: Cardiovascular Disease

## 2018-01-19 ENCOUNTER — Ambulatory Visit: Payer: Medicare Other | Admitting: Orthotics

## 2018-01-19 DIAGNOSIS — E1151 Type 2 diabetes mellitus with diabetic peripheral angiopathy without gangrene: Secondary | ICD-10-CM

## 2018-01-19 DIAGNOSIS — M2011 Hallux valgus (acquired), right foot: Secondary | ICD-10-CM

## 2018-01-19 DIAGNOSIS — M2012 Hallux valgus (acquired), left foot: Secondary | ICD-10-CM

## 2018-01-19 DIAGNOSIS — M2042 Other hammer toe(s) (acquired), left foot: Secondary | ICD-10-CM

## 2018-01-19 DIAGNOSIS — M2041 Other hammer toe(s) (acquired), right foot: Secondary | ICD-10-CM

## 2018-01-19 NOTE — Telephone Encounter (Signed)
Patient called to schedule his yearly follow up appt with Dr. Burt Knack.  No appts are available please call to schedule.

## 2018-01-19 NOTE — Telephone Encounter (Signed)
Scheduled patient 05/23/2018. He was grateful for call and agrees with treatment plan.

## 2018-01-20 NOTE — Progress Notes (Signed)
Patient had appointment today for definitive and final diabetic shoe fitting and delivery.  Patient was seen by Betha, C.Ped, [facility].   The inserts fit well and accomplished full contact with the plantar surface of the foot bilateral; the shoes fit well and offered forefoot freedom, no noticible heel slippage, and good toe clearance w/ the insert in place.  Patient was advised to monitor of any skin irritation, breakdown.  Patient was satisfied with fit and function. 

## 2018-01-24 ENCOUNTER — Other Ambulatory Visit: Payer: Self-pay | Admitting: Family Medicine

## 2018-01-24 NOTE — Telephone Encounter (Signed)
Sent. Thanks.   

## 2018-01-24 NOTE — Telephone Encounter (Signed)
Electronic refill request. Colcrys Last office visit:   12/03/17 Last Filled:   11/04/2017 ? Quantity Please advise.

## 2018-01-29 DIAGNOSIS — Z23 Encounter for immunization: Secondary | ICD-10-CM | POA: Diagnosis not present

## 2018-01-30 ENCOUNTER — Other Ambulatory Visit: Payer: Self-pay | Admitting: Cardiovascular Disease

## 2018-02-14 ENCOUNTER — Ambulatory Visit (INDEPENDENT_AMBULATORY_CARE_PROVIDER_SITE_OTHER): Payer: Medicare Other

## 2018-02-14 DIAGNOSIS — Z5181 Encounter for therapeutic drug level monitoring: Secondary | ICD-10-CM

## 2018-02-14 DIAGNOSIS — I4819 Other persistent atrial fibrillation: Secondary | ICD-10-CM | POA: Diagnosis not present

## 2018-02-14 LAB — POCT INR: INR: 3 (ref 2.0–3.0)

## 2018-02-14 NOTE — Patient Instructions (Signed)
Description   Continue on same dosage 1 tablet daily except 1.5 tablets each Mondays and Thursdays. Recheck INR in 6 weeks.

## 2018-02-27 ENCOUNTER — Other Ambulatory Visit: Payer: Self-pay | Admitting: Family Medicine

## 2018-02-28 NOTE — Telephone Encounter (Addendum)
Electronic refill request. Colchicine Last office visit:   12/03/17 60 tabs with 1 RF on 10/15/17 Please advise.

## 2018-03-01 NOTE — Telephone Encounter (Signed)
Sent. Thanks.   

## 2018-03-02 ENCOUNTER — Other Ambulatory Visit: Payer: Self-pay | Admitting: Internal Medicine

## 2018-03-02 DIAGNOSIS — K222 Esophageal obstruction: Secondary | ICD-10-CM

## 2018-03-02 DIAGNOSIS — R1319 Other dysphagia: Secondary | ICD-10-CM

## 2018-03-02 DIAGNOSIS — R1310 Dysphagia, unspecified: Secondary | ICD-10-CM

## 2018-03-18 ENCOUNTER — Ambulatory Visit: Payer: Medicare Other | Admitting: Podiatry

## 2018-03-28 ENCOUNTER — Ambulatory Visit (INDEPENDENT_AMBULATORY_CARE_PROVIDER_SITE_OTHER): Payer: Medicare Other | Admitting: *Deleted

## 2018-03-28 DIAGNOSIS — I4819 Other persistent atrial fibrillation: Secondary | ICD-10-CM | POA: Diagnosis not present

## 2018-03-28 DIAGNOSIS — Z5181 Encounter for therapeutic drug level monitoring: Secondary | ICD-10-CM

## 2018-03-28 LAB — POCT INR: INR: 1.9 — AB (ref 2.0–3.0)

## 2018-03-28 NOTE — Patient Instructions (Signed)
Description   Today take 2 tablets then continue on same dosage 1 tablet daily except 1.5 tablets each Mondays and Thursdays. Recheck INR in 6 weeks.

## 2018-05-08 ENCOUNTER — Other Ambulatory Visit: Payer: Self-pay | Admitting: Cardiovascular Disease

## 2018-05-08 DIAGNOSIS — I4819 Other persistent atrial fibrillation: Secondary | ICD-10-CM

## 2018-05-08 DIAGNOSIS — R42 Dizziness and giddiness: Secondary | ICD-10-CM

## 2018-05-08 DIAGNOSIS — I2510 Atherosclerotic heart disease of native coronary artery without angina pectoris: Secondary | ICD-10-CM

## 2018-05-09 ENCOUNTER — Telehealth: Payer: Self-pay | Admitting: Family Medicine

## 2018-05-09 NOTE — Telephone Encounter (Signed)
I don't have any open slots in the schedule.  We were able to offer patient a same day appointment.  We work together as a clinic.  I appreciate my colleagues being willing to see other patients when they have a slot on the schedule.  I have all confidence in [place].  I'll defer to patient.

## 2018-05-09 NOTE — Telephone Encounter (Signed)
Appointment scheduled.
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
## 2018-05-10 ENCOUNTER — Ambulatory Visit: Payer: Medicare Other | Admitting: Family Medicine

## 2018-05-12 ENCOUNTER — Encounter: Payer: Self-pay | Admitting: Family Medicine

## 2018-05-12 ENCOUNTER — Ambulatory Visit (INDEPENDENT_AMBULATORY_CARE_PROVIDER_SITE_OTHER)
Admission: RE | Admit: 2018-05-12 | Discharge: 2018-05-12 | Disposition: A | Discharge status: Home / Self Care | Payer: Medicare Other | Source: Ambulatory Visit | Attending: Family Medicine | Admitting: Family Medicine

## 2018-05-12 ENCOUNTER — Ambulatory Visit (INDEPENDENT_AMBULATORY_CARE_PROVIDER_SITE_OTHER): Payer: Medicare Other | Admitting: Family Medicine

## 2018-05-12 VITALS — BP 124/68 | HR 93 | Temp 98.7°F | Ht 66.0 in | Wt 214.5 lb

## 2018-05-12 DIAGNOSIS — R059 Cough, unspecified: Secondary | ICD-10-CM

## 2018-05-12 DIAGNOSIS — R05 Cough: Secondary | ICD-10-CM | POA: Diagnosis not present

## 2018-05-12 DIAGNOSIS — Z20828 Contact with and (suspected) exposure to other viral communicable diseases: Secondary | ICD-10-CM | POA: Diagnosis not present

## 2018-05-12 DIAGNOSIS — I2510 Atherosclerotic heart disease of native coronary artery without angina pectoris: Secondary | ICD-10-CM

## 2018-05-12 LAB — POC INFLUENZA A&B (BINAX/QUICKVUE)
Influenza A, POC: NEGATIVE
Influenza B, POC: NEGATIVE

## 2018-05-12 MED ORDER — OSELTAMIVIR PHOSPHATE 75 MG PO CAPS: 75.0000 mg | ORAL_CAPSULE | Freq: Two times a day (BID) | ORAL | 0 refills | Status: DC

## 2018-05-12 NOTE — Progress Notes (Signed)
He had elevated carbon monoxide level at home.  He had furnace replaced afterward since that was the issue.  And he got the house checked with alarm in place.  It appears that the issue is resolved now.  Recently with family member tested pos for flu A.  Sick for the last week.  Cough and some sputum.  No vomiting.  Fatigue.  No fevers except for one day.  Some occ chills.  Taking tylenol as needed.  Some aches from coughing.    He feels better today.   Meds, vitals, and allergies reviewed.   ROS: Per HPI unless specifically indicated in ROS section   GEN: nad, alert and oriented HEENT: mucous membranes moist, tm w/o erythema, nasal exam w/o erythema, clear discharge noted,  OP with cobblestoning NECK: supple w/o LA CV: IRR   PULM: no inc wob and moving air equally but he does have some scattered diffuse bilateral rhonchi. EXT: trace BLE edema SKIN: no acute rash Speaking in complete sentences.

## 2018-05-12 NOTE — Patient Instructions (Signed)
Go to the lab on the way out.  We'll contact you with your xray report. Start tamiflu.   We may need to change your meds based on your xray.  Take care.  Glad to see you.  Update me as needed.

## 2018-05-13 ENCOUNTER — Ambulatory Visit: Payer: Medicare Other | Admitting: Family Medicine

## 2018-05-15 NOTE — Assessment & Plan Note (Signed)
Chest x-ray without acute process.  Would not start antibiotics at this point.  Would start Tamiflu given his recent symptoms and his known exposure to flu.  Okay for outpatient follow-up.  Supportive treatment otherwise.  Update me as needed.  He agrees.

## 2018-05-20 ENCOUNTER — Ambulatory Visit (INDEPENDENT_AMBULATORY_CARE_PROVIDER_SITE_OTHER): Payer: Medicare Other

## 2018-05-20 DIAGNOSIS — Z5181 Encounter for therapeutic drug level monitoring: Secondary | ICD-10-CM | POA: Diagnosis not present

## 2018-05-20 DIAGNOSIS — I4819 Other persistent atrial fibrillation: Secondary | ICD-10-CM

## 2018-05-20 LAB — POCT INR: INR: 2.3 (ref 2.0–3.0)

## 2018-05-20 NOTE — Patient Instructions (Signed)
Description   Continue on same dosage 1 tablet daily except 1.5 tablets each Mondays and Thursdays. Recheck INR in 6 weeks.

## 2018-05-23 ENCOUNTER — Ambulatory Visit: Payer: Medicare Other | Admitting: Cardiovascular Disease

## 2018-05-24 ENCOUNTER — Encounter: Payer: Self-pay | Admitting: Family Medicine

## 2018-05-24 ENCOUNTER — Ambulatory Visit (INDEPENDENT_AMBULATORY_CARE_PROVIDER_SITE_OTHER): Payer: Medicare Other | Admitting: Family Medicine

## 2018-05-24 ENCOUNTER — Telehealth: Payer: Self-pay | Admitting: *Deleted

## 2018-05-24 DIAGNOSIS — I2510 Atherosclerotic heart disease of native coronary artery without angina pectoris: Secondary | ICD-10-CM

## 2018-05-24 DIAGNOSIS — J0100 Acute maxillary sinusitis, unspecified: Secondary | ICD-10-CM

## 2018-05-24 MED ORDER — BENZONATATE 200 MG PO CAPS: 200.0000 mg | ORAL_CAPSULE | Freq: Three times a day (TID) | ORAL | 1 refills | Status: DC | PRN

## 2018-05-24 MED ORDER — HYDROCODONE-HOMATROPINE 5-1.5 MG/5ML PO SYRP: 5.0000 mL | ORAL_SOLUTION | Freq: Three times a day (TID) | ORAL | 0 refills | Status: DC | PRN

## 2018-05-24 NOTE — Telephone Encounter (Signed)
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  Then about 2 weeks after that, patient's daughter Lawerance Bach) got sick and about 2 weeks after that, Mr. Borkenhagen got sick.  Daughter is concerned about the Coronovirus and says there was nothing in the media at that time about the virus but now she wonders if that is what they have all had.

## 2018-05-24 NOTE — Patient Instructions (Addendum)
Finish the cefdinir for a presumed sinus infection.   Use tessalon pills for the cough if needed.  Then use hycodan syrup if needed, sedation caution.  Update me as needed.  Take care.  Glad to see you.

## 2018-05-24 NOTE — Progress Notes (Signed)
He has tolerated cefidinir in the past, no ADE on med in the last 4-5 days.  No rash, no swelling.    He had prev flu exposure, was treated with tamiflu.  He got some better, but not totally resolved.  Still with cough and fatigue.   No fevers.   Sputum is discolored in the AM, then white later in the day.   Not SOB now, this is improved from a few days ago- he was winded with with housecleaning/sweeping prev . Some rhinorrhea/congestion, alternating.    Taking delsym w/o relief of cough.    Meds, vitals, and allergies reviewed.   ROS: Per HPI unless specifically indicated in ROS section   GEN: nad, alert and oriented HEENT: mucous membranes moist, tm w/o erythema, nasal exam w/o erythema, clear discharge noted,  OP with cobblestoning NECK: supple w/o LA CV: rrr.   PULM: ctab, no inc wob, upper airway noise noted but no focal dec in BS EXT: no edema SKIN: well perfused.  L max sinus ttp.

## 2018-05-25 DIAGNOSIS — J0100 Acute maxillary sinusitis, unspecified: Secondary | ICD-10-CM | POA: Insufficient documentation

## 2018-05-25 NOTE — Telephone Encounter (Signed)
D/w pt at OV.  Given the timeline and 2 week isolation recommendation, it would be out of the typical timeline for recent coronavirus concerns.  And he prev had known contact with patient who was flu positive.  I don't think that coronavirus would be the issue here.  See OV note.

## 2018-05-25 NOTE — Assessment & Plan Note (Signed)
Reasonable to finish the cefdinir for a presumed sinus infection.   Use tessalon pills for the cough if needed.  Then use hycodan syrup if needed, sedation caution.  Update me as needed.  He agrees with plan.  Okay for outpatient follow-up.

## 2018-05-31 ENCOUNTER — Telehealth: Payer: Self-pay | Admitting: Physician Assistant

## 2018-05-31 NOTE — Telephone Encounter (Signed)
Spoke with patient over phone. Not having any cardiac symptoms. Will reschedule his appointment in 8 weeks due to CD 19 outbreak.

## 2018-06-07 ENCOUNTER — Ambulatory Visit: Payer: Medicare Other | Admitting: Physician Assistant

## 2018-06-09 ENCOUNTER — Other Ambulatory Visit: Payer: Self-pay

## 2018-06-09 ENCOUNTER — Ambulatory Visit (INDEPENDENT_AMBULATORY_CARE_PROVIDER_SITE_OTHER): Payer: Medicare Other | Admitting: Family Medicine

## 2018-06-09 DIAGNOSIS — J0100 Acute maxillary sinusitis, unspecified: Secondary | ICD-10-CM

## 2018-06-09 MED ORDER — PREDNISONE 10 MG PO TABS: ORAL_TABLET | ORAL | 0 refills | Status: DC

## 2018-06-09 NOTE — Progress Notes (Signed)
Virtual Visit via Telephone Note  I connected with patient on 06/09/18 at 3:12 PM  by telephone and verified that I am speaking with the correct person using two identifiers.  Location of patient: home  Location of MD: [place] Name of referring provider (if blank then none associated): Names per persons and role in encounter:  MD: Earlyne Iba, Patient: name listed above.    I discussed the limitations, risks, security and privacy concerns of performing an evaluation and management service by telephone and the availability of in person appointments. I also discussed with the patient that there may be a patient responsible charge related to this service. The patient expressed understanding and agreed to proceed.  History of Present Illness: since last OV, he still has cough, sputum is green in the AM, whitish in the day later on.  Fatigued.   Not SOB.  No facial or ear pain.  No fevers.  No vomiting.  Some nausea in the AM, better later in the day.  No bleeding.  No recent gout sx.  Still on allopurinol daily.    Still on coumadin.  1.5 tabs on 2 days a week, 1 tab a day on other days.  Done with cefdinir.   He didn't tolerate hycodan with "swimmy headed" sensation the day after use.  He stopped it in the meantime.  Taking coricidin and delsym for cough.  Tessalon didn't help cough.  Using flonase as needed.    He can tolerate prednisone.   Observations/Objective: speaking in complete sentences, NAD, but sounds to have nasal stuffiness.     Assessment and Plan: URI sx.  With continued nasal stuffiness with cough.  Sputum clears as the day goes on.  Not SOB.  S/p abx tx.  Would not continue abx at this point.  Can use otc cough medicine as needed.  Intolerance list updated about hydrocodone.  Would try prednisone with routine instructions given.  That may help with congestion and he'll update me as needed.  No urgent or emergent sx.  He agrees with plan.   Follow Up Instructions:  call back prn.  He agrees.    I discussed the assessment and treatment plan with the patient. The patient was provided an opportunity to ask questions and all were answered. The patient agreed with the plan and demonstrated an understanding of the instructions.   The patient was advised to call back or seek an in-person evaluation if the symptoms worsen or if the condition fails to improve as anticipated.  I provided 17 minutes of non-face-to-face time during this encounter.  Elsie Stain, MD

## 2018-06-09 NOTE — Assessment & Plan Note (Signed)
URI sx.  With continued nasal stuffiness with cough.  Sputum clears as the day goes on.  Not SOB.  S/p abx tx.  Would not continue abx at this point.  Can use otc cough medicine as needed.  Intolerance list updated about hydrocodone.  Would try prednisone with routine instructions given.  That may help with congestion and he'll update me as needed.  No urgent or emergent sx.  He agrees with plan.   Follow Up Instructions: call back prn.  He agrees.

## 2018-06-18 ENCOUNTER — Other Ambulatory Visit: Payer: Self-pay | Admitting: Internal Medicine

## 2018-06-18 DIAGNOSIS — K222 Esophageal obstruction: Secondary | ICD-10-CM

## 2018-06-18 DIAGNOSIS — R1319 Other dysphagia: Secondary | ICD-10-CM

## 2018-06-18 DIAGNOSIS — R1310 Dysphagia, unspecified: Secondary | ICD-10-CM

## 2018-06-30 ENCOUNTER — Telehealth: Payer: Self-pay

## 2018-06-30 NOTE — Telephone Encounter (Signed)

## 2018-07-01 ENCOUNTER — Telehealth: Payer: Self-pay | Admitting: Family Medicine

## 2018-07-01 ENCOUNTER — Ambulatory Visit (INDEPENDENT_AMBULATORY_CARE_PROVIDER_SITE_OTHER): Payer: Medicare Other | Admitting: Pharmacist

## 2018-07-01 ENCOUNTER — Other Ambulatory Visit: Payer: Self-pay

## 2018-07-01 DIAGNOSIS — Z5181 Encounter for therapeutic drug level monitoring: Secondary | ICD-10-CM | POA: Diagnosis not present

## 2018-07-01 DIAGNOSIS — I4819 Other persistent atrial fibrillation: Secondary | ICD-10-CM

## 2018-07-01 LAB — POCT INR: INR: 2.7 (ref 2.0–3.0)

## 2018-07-01 NOTE — Telephone Encounter (Signed)
Called to check on patient.  His cough is better.  He has been at home given covid pandemic.  Cautions d/w pt.  He agrees.

## 2018-07-09 ENCOUNTER — Ambulatory Visit (INDEPENDENT_AMBULATORY_CARE_PROVIDER_SITE_OTHER): Payer: Medicare Other | Admitting: Family Medicine

## 2018-07-09 ENCOUNTER — Other Ambulatory Visit: Payer: Self-pay

## 2018-07-09 ENCOUNTER — Emergency Department (HOSPITAL_COMMUNITY)
Admission: EM | Admit: 2018-07-09 | Discharge: 2018-07-09 | Disposition: A | Discharge status: Home / Self Care | Payer: Medicare Other | Attending: Emergency Medicine | Admitting: Emergency Medicine

## 2018-07-09 ENCOUNTER — Encounter: Payer: Self-pay | Admitting: Family Medicine

## 2018-07-09 ENCOUNTER — Emergency Department (HOSPITAL_COMMUNITY): Payer: Medicare Other

## 2018-07-09 VITALS — HR 91 | Resp 16

## 2018-07-09 DIAGNOSIS — J069 Acute upper respiratory infection, unspecified: Secondary | ICD-10-CM | POA: Diagnosis not present

## 2018-07-09 DIAGNOSIS — Z87891 Personal history of nicotine dependence: Secondary | ICD-10-CM | POA: Diagnosis not present

## 2018-07-09 DIAGNOSIS — E1122 Type 2 diabetes mellitus with diabetic chronic kidney disease: Secondary | ICD-10-CM | POA: Insufficient documentation

## 2018-07-09 DIAGNOSIS — Z79899 Other long term (current) drug therapy: Secondary | ICD-10-CM | POA: Insufficient documentation

## 2018-07-09 DIAGNOSIS — R0602 Shortness of breath: Secondary | ICD-10-CM | POA: Diagnosis not present

## 2018-07-09 DIAGNOSIS — I2510 Atherosclerotic heart disease of native coronary artery without angina pectoris: Secondary | ICD-10-CM

## 2018-07-09 DIAGNOSIS — B9789 Other viral agents as the cause of diseases classified elsewhere: Secondary | ICD-10-CM | POA: Diagnosis not present

## 2018-07-09 DIAGNOSIS — R05 Cough: Secondary | ICD-10-CM | POA: Diagnosis not present

## 2018-07-09 DIAGNOSIS — J988 Other specified respiratory disorders: Secondary | ICD-10-CM | POA: Diagnosis not present

## 2018-07-09 DIAGNOSIS — N183 Chronic kidney disease, stage 3 (moderate): Secondary | ICD-10-CM | POA: Insufficient documentation

## 2018-07-09 DIAGNOSIS — I129 Hypertensive chronic kidney disease with stage 1 through stage 4 chronic kidney disease, or unspecified chronic kidney disease: Secondary | ICD-10-CM | POA: Diagnosis not present

## 2018-07-09 DIAGNOSIS — R059 Cough, unspecified: Secondary | ICD-10-CM

## 2018-07-09 LAB — CBC WITH DIFFERENTIAL/PLATELET
Abs Immature Granulocytes: 0.04 10*3/uL (ref 0.00–0.07)
Basophils Absolute: 0 10*3/uL (ref 0.0–0.1)
Basophils Relative: 0 %
Eosinophils Absolute: 0.1 10*3/uL (ref 0.0–0.5)
Eosinophils Relative: 2 %
HCT: 47.3 % (ref 39.0–52.0)
Hemoglobin: 15.4 g/dL (ref 13.0–17.0)
Immature Granulocytes: 1 %
Lymphocytes Relative: 17 %
Lymphs Abs: 1.3 10*3/uL (ref 0.7–4.0)
MCH: 28.8 pg (ref 26.0–34.0)
MCHC: 32.6 g/dL (ref 30.0–36.0)
MCV: 88.4 fL (ref 80.0–100.0)
Monocytes Absolute: 0.7 10*3/uL (ref 0.1–1.0)
Monocytes Relative: 9 %
Neutro Abs: 5.5 10*3/uL (ref 1.7–7.7)
Neutrophils Relative %: 71 %
Platelets: 154 10*3/uL (ref 150–400)
RBC: 5.35 MIL/uL (ref 4.22–5.81)
RDW: 14.4 % (ref 11.5–15.5)
WBC: 7.7 10*3/uL (ref 4.0–10.5)
nRBC: 0 % (ref 0.0–0.2)

## 2018-07-09 LAB — BASIC METABOLIC PANEL
Anion gap: 10 (ref 5–15)
BUN: 18 mg/dL (ref 8–23)
CO2: 27 mmol/L (ref 22–32)
Calcium: 9.1 mg/dL (ref 8.9–10.3)
Chloride: 100 mmol/L (ref 98–111)
Creatinine, Ser: 1.31 mg/dL — ABNORMAL HIGH (ref 0.61–1.24)
GFR calc Af Amer: 57 mL/min — ABNORMAL LOW (ref >60)
GFR calc non Af Amer: 49 mL/min — ABNORMAL LOW (ref >60)
Glucose, Bld: 106 mg/dL — ABNORMAL HIGH (ref 70–99)
Potassium: 4.2 mmol/L (ref 3.5–5.1)
Sodium: 137 mmol/L (ref 135–145)

## 2018-07-09 LAB — TROPONIN I: Troponin I: <0.03 ng/mL (ref <0.03)

## 2018-07-09 MED ORDER — AEROCHAMBER PLUS FLO-VU MEDIUM MISC
1.0000 | Freq: Once | Status: AC
  Administered 2018-07-09: 1
  Filled 2018-07-09: qty 1

## 2018-07-09 MED ORDER — IPRATROPIUM BROMIDE HFA 17 MCG/ACT IN AERS
2.0000 | INHALATION_SPRAY | Freq: Once | RESPIRATORY_TRACT | Status: AC
  Administered 2018-07-09: 2 via RESPIRATORY_TRACT
  Filled 2018-07-09: qty 12.9

## 2018-07-09 MED ORDER — ALBUTEROL SULFATE HFA 108 (90 BASE) MCG/ACT IN AERS: 2.0000 | INHALATION_SPRAY | Freq: Once | RESPIRATORY_TRACT | Status: DC

## 2018-07-09 MED ORDER — ALBUTEROL SULFATE HFA 108 (90 BASE) MCG/ACT IN AERS
4.0000 | INHALATION_SPRAY | Freq: Once | RESPIRATORY_TRACT | Status: AC
  Administered 2018-07-09: 13:00:00 4 via RESPIRATORY_TRACT
  Filled 2018-07-09: qty 6.7

## 2018-07-09 NOTE — ED Triage Notes (Signed)
Per Pt: Pt reports he has been sick for about a month. Pt reports cough, dyspnea with exertion. Pt denies N/V/D.  Pt denies chest pain.  Pt reports some headaches

## 2018-07-09 NOTE — ED Notes (Signed)
Bed: WA01 Expected date:  Expected time:  Means of arrival:  Comments: 

## 2018-07-09 NOTE — Progress Notes (Signed)
Virtual Visit via Video Note   I connected with [name] and his daughter on 07/09/18 at 10:40 AM EDT by a video enabled telemedicine application and verified that I am speaking with the correct person using two identifiers.  Location patient: home Location provider:work or home office Persons participating in the virtual visit: patient, provider  I discussed the limitations of evaluation and management by telemedicine and the availability of in person appointments. He expressed understanding and agreed to proceed.   HPI: He is a 85 yo male with Hx of OSA, hypertension, CAD, DM 2, CHF, and CKD; who is complaining of productive cough that is started last night, greenish sputum. He has not noted fever, chills, chest pain, wheezing, abdominal pain, changes in bowel habits, or urinary symptoms. He has mild rhinorrhea and nasal congestion. Today he started with fatigue, sore throat, shortness of breath, and worsening cough. He denies hemoptysis.  He is not sure about exacerbating or aggravating factors. Daughter states that until last night before cough started he was feeling "fine."  He has follow-up recommendations in regard to stay home due to COVID-19 pandemia. But her 2 daughters have been visiting, not wearing mask and not practicing social distance.  According to her daughter, there are several residents in the area that have been diagnosed with COVID-19.  Daughter is asking me to call to the hospital for him to be tested for COVID-19.    ROS: See pertinent positives and negatives per HPI.  Past Medical History:  Diagnosis Date  . Bradycardia    a. nocturnal with pauses overnight on tele likely due to OSA 10/2015.  [name] CKD (chronic kidney disease), stage III ([place])   . Coronary artery disease    a. prev nonobst. b. LHC 10/24/15: occlusion of small diffusely diseased OM2 s/p balloon angioplasty, mild nonobstructive disease of mLAD and mRCA, normal LVEDP  . Diabetes mellitus without  complication ([place])   . Diverticulosis   . Esophageal reflux   . Esophageal stricture from GERD 06/08/2011   With stricture at GE junction on EGD, dilated 06/2011   . Gastric ulcer    on EGD 2013  . Gout, unspecified    "on daily RX" (09/09/2017)  . History of blood transfusion 02/1964   "when I had right little finger cut off"  . Hyperlipidemia   . Hypertension   . Obesity, unspecified   . Orthopnea   . OSA on CPAP   . Paroxysmal nocturnal dyspnea   . Persistent atrial fibrillation   . Vertigo     Past Surgical History:  Procedure Laterality Date  . BALLOON DILATION N/A 10/22/2014   Procedure: BALLOON DILATION;  Surgeon: [name], MD;  Location: [facility];  Service: Endoscopy;  Laterality: N/A;  . CARDIAC CATHETERIZATION  07/2008  . CARDIAC CATHETERIZATION N/A 10/24/2015   Procedure: Left Heart Cath and Coronary Angiography;  Surgeon: [name], MD;  Location: [facility];  Service: Cardiovascular;  Laterality: N/A;  . CARDIAC CATHETERIZATION N/A 10/24/2015   Procedure: Coronary Balloon Angioplasty;  Surgeon: [name], MD;  Location: [facility];  Service: Cardiovascular;  Laterality: N/A;  . Carotid Dopplers  09/2009   no sig extracranial stenosis and vertebral arteries had antegrade flow  . CATARACT EXTRACTION W/ INTRAOCULAR LENS  IMPLANT, BILATERAL Bilateral   . ESOPHAGOGASTRODUODENOSCOPY (EGD) WITH ESOPHAGEAL DILATION     "I've had it stretched 3 times" (10/23/2015)  . ESOPHAGOGASTRODUODENOSCOPY (EGD) WITH ESOPHAGEAL DILATION  06/2011   /medical hx  above  (09/09/2017)  . ESOPHAGOGASTRODUODENOSCOPY (EGD) WITH PROPOFOL N/A 10/22/2014   Procedure: ESOPHAGOGASTRODUODENOSCOPY (EGD) WITH PROPOFOL;  Surgeon: [name], MD;  Location: [facility];  Service: Endoscopy;  Laterality: N/A;  . FINGER SURGERY Right 02/1964   "cut little finger off; had it reattached"  . I&D EXTREMITY Right 09/10/2017   Procedure: RIGHT FOOT DEBRIDEMENT;  Surgeon: [name], MD;  Location: [facility];  Service: Orthopedics;  Laterality: Right;  . MRI of brain  09/2009   chronic microvascular ischemia  . OSA Sleep Study     per Dr. Brett Fairy  . SHOULDER ARTHROSCOPY W/ ROTATOR CUFF REPAIR Right 2008    Family History  Problem Relation Age of Onset  . Stomach cancer Mother        died in 34's   . Hypertension Father        died in his 4's pna likely dementia  . Dementia Father   . Pneumonia Father   . Heart disease Brother        S/P CABG  . Hypertension Other   . Prostate cancer Neg Hx   . Colon cancer Neg Hx     Social History   Socioeconomic History  . Marital status: Widowed    Spouse name: Not on file  . Number of children: 3  . Years of education: Not on file  . Highest education level: Not on file  Occupational History  . 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 Financial resource strain: Not on file  . Food insecurity:    Worry: Not on file    Inability: Not on file  . Transportation needs:    Medical: Not on file    Non-medical: Not on file  Tobacco Use  . Smoking status: Former Smoker    Packs/day: 0.12    Years: 4.00    Pack years: 0.48    Types: Cigarettes    Last attempt to quit: 03/16/1969    Years since quitting: 49.3  . Smokeless tobacco: Former Systems developer  . Tobacco comment: "chewed when I smoked; smoked 1 pack/week for about 4 years, quit 1971  Substance and Sexual Activity  . Alcohol use: Not Currently    Alcohol/week: 0.0 standard drinks    Comment: 09/09/2017  "Quit all alcohol in 1970's"  . Drug use: Never  . Sexual activity: Not on file  Lifestyle  . Physical activity:    Days per week: Not on file    Minutes per session: Not on file  . Stress: Not on file  Relationships  . Social connections:    Talks on phone: Not on file    Gets together: Not on file    Attends religious service: Not on file    Active member of club or organization: Not on file    Attends  meetings of clubs or organizations: Not on file    Relationship status: Not on file  . Intimate partner violence:    Fear of current or ex partner: Not on file    Emotionally abused: Not on file    Physically abused: Not on file    Forced sexual activity: Not on file  Other Topics Concern  . Not on file  Social History Narrative   Lives in [place], married 1953, widowed after 32 years   Retired Administrator, still does Biomedical scientist.   He is not routinely exercising.   Daily caffeine  Current Outpatient Medications:  .  acetaminophen (TYLENOL) 500 MG tablet, Take 1,000 mg by mouth as needed for mild pain., Disp: , Rfl:  .  allopurinol (ZYLOPRIM) 100 MG tablet, Take 1 tablet (100 mg total) by mouth daily., Disp: 90 tablet, Rfl: 3 .  colchicine (COLCRYS) 0.6 MG tablet, TAKE 1 TABLET BY MOUTH DAILY IF NEEDED-CAN TAKE 2ND TAB PER DAY IF GOUT FLARE. (Patient taking differently: Take 0.6 mg by mouth daily as needed (gout flare). ), Disp: 60 tablet, Rfl: 1 .  diltiazem (CARDIZEM CD) 180 MG 24 hr capsule, TAKE 1 CAPSULE (180 MG TOTAL) BY MOUTH DAILY., Disp: 30 capsule, Rfl: 10 .  furosemide (LASIX) 20 MG tablet, TAKE 1 TABLET (20 MG TOTAL) BY MOUTH DAILY., Disp: 90 tablet, Rfl: 1 .  lisinopril (PRINIVIL,ZESTRIL) 20 MG tablet, Take 1 tablet (20 mg total) by mouth daily. Please keep upcoming appt in March with Dr. Burt Knack for future refills. Thank you (Patient not taking: Reported on 07/09/2018), Disp: 90 tablet, Rfl: 0 .  metoprolol tartrate (LOPRESSOR) 25 MG tablet, TAKE 1/2 TABLET BY MOUTH TWICE A DAY (Patient taking differently: Take 12.5 mg by mouth 2 (two) times daily. ), Disp: 30 tablet, Rfl: 7 .  mupirocin ointment (BACTROBAN) 2 %, Apply 1 application topically 2 (two) times daily. Apply to the affected area 2 times a day (Patient not taking: Reported on 07/09/2018), Disp: 22 g, Rfl: 3 .  nitroGLYCERIN (NITROSTAT) 0.4 MG SL tablet, Place 1 tablet (0.4 mg total) under the tongue every 5  (five) minutes x 3 doses as needed for chest pain., Disp: 25 tablet, Rfl: 2 .  omeprazole (PRILOSEC) 40 MG capsule, TAKE 1 CAPSULE BY MOUTH EVERY DAY (Patient taking differently: Take 40 mg by mouth daily. ), Disp: 30 capsule, Rfl: 2 .  predniSONE (DELTASONE) 10 MG tablet, Take 2 a day for 5 days, then 1 a day for 5 days, with food. Don't take with aleve/ibuprofen. (Patient not taking: Reported on 07/09/2018), Disp: 15 tablet, Rfl: 0 .  warfarin (COUMADIN) 4 MG tablet, TAKE AS DIRECTED BY COUMADIN CLINIC (Patient taking differently: Take 4-6 mg by mouth See admin instructions. On Monday and Thursday take 6 mg On Tuesday, Wednesday, Friday, Saturday, Sunday take 4 mg), Disp: 120 tablet, Rfl: 1  EXAM:  VITALS per patient if applicable:Pulse 91   Resp 16   SpO2 96%   GENERAL: alert, oriented, appears well and in no acute distress  HEENT: atraumatic, conjunctiva clear, no obvious facial abnormalities on inspection.Hearing loss,stable.  LUNGS: on inspection no signs of respiratory distress, breathing rate appears normal, no obvious gross SOB, gasping or wheezing. Productive cough a couple times during visit.  CV: no obvious cyanosis  MS: moves all visible extremities without noticeable abnormality  PSYCH/NEURO: pleasant and cooperative, no obvious depression or anxiety, speech and thought processing grossly intact  ASSESSMENT AND PLAN:  Discussed the following assessment and plan:  Cough - Plan: DG Chest 2 View We will hold on prescribing antitussive medication until CXR report is back. Adequate hydration.  Respiratory tract infection We discussed possible etiologies,most likely viral. Explained that symptomatic treatment is usually recommended in this case. Monitor for new symptoms.  SOB (shortness of breath) Because daughter is reporting worsening symptoms, I recommend taking him to urgent care to be evaluated and to obtain chest imaging.  He does not seem to be in respiratory  distress. O2 sat checked by daughter in 96% at RA.  His daughter refused to take him to urgent  care, she is afraid of possible exposure to COVID-19. Explained that currently we do not have test availability.  Educated about [id number] symptoms, complications, and treatment options (symptomatic treatment).  She agrees with having CXR arranged for today. Clearly instructed about warning signs.    I discussed the assessment and treatment plan with the patient. [name] and daughter were provided an opportunity to ask questions and all were answered. The patient agreed with the plan and demonstrated an understanding of the instructions.   He and her daughter were advised to call back or seek an in-person evaluation if the symptoms worsen or if the condition fails to improve as anticipated.    Return in about 2 days (around 07/11/2018) for PCP.  Addendum: Noted that [name] checked in the ER.   Teal Raben [place], MD

## 2018-07-09 NOTE — ED Provider Notes (Signed)
[facility] DEPT Provider Note   CSN: [id number] Arrival date & time: 07/09/18  1229    History   Chief Complaint Chief Complaint  Patient presents with  . Cough  . Shortness of Breath    HPI [name] is a 85 y.o. male.     Pt reports intermittent cough over the past month. Reports 3 courses of abx from his pcp without improvement in symptoms. No hx of asthma or COPD. No fevers. Reports eyes are "pouring". Denies n/v/d. No known COVID contacts or PUI for COVID-19. Hx of afib on coumadin. Symptoms are mild in severity     Past Medical History:  Diagnosis Date  . Bradycardia    a. nocturnal with pauses overnight on tele likely due to OSA 10/2015.  [name] CKD (chronic kidney disease), stage III ([place])   . Coronary artery disease    a. prev nonobst. b. LHC 10/24/15: occlusion of small diffusely diseased OM2 s/p balloon angioplasty, mild nonobstructive disease of mLAD and mRCA, normal LVEDP  . Diabetes mellitus without complication ([place])   . Diverticulosis   . Esophageal reflux   . Esophageal stricture from GERD 06/08/2011   With stricture at GE junction on EGD, dilated 06/2011   . Gastric ulcer    on EGD 2013  . Gout, unspecified    "on daily RX" (09/09/2017)  . History of blood transfusion 02/1964   "when I had right little finger cut off"  . Hyperlipidemia   . Hypertension   . Obesity, unspecified   . Orthopnea   . OSA on CPAP   . Paroxysmal nocturnal dyspnea   . Persistent atrial fibrillation   . Vertigo     Patient Active Problem List   Diagnosis Date Noted  . Acute non-recurrent maxillary sinusitis 05/25/2018  . Fever 12/05/2017  . Achilles tendinitis 12/05/2017  . Idiopathic chronic gout, unspecified site, without tophus (tophi) 10/06/2017  . Cutaneous abscess of right foot   . Septic arthritis ([place]) 09/09/2017  . URI (upper respiratory infection) 11/24/2016  . Atypical angina ([place]) 11/08/2015  . Acute diastolic heart failure  ([place])   . CAD S/P percutaneous coronary angioplasty   . Chronic atrial fibrillation   . CKD (chronic kidney disease), stage III ([place])   . Persistent atrial fibrillation   . Hypertension   . Hyperlipidemia   . Bradycardia   . Back pain 05/24/2015  . Triceps tendonitis 05/24/2015  . Diabetes mellitus without complication ([place]) 02/77/4128  . Dysphagia, pharyngoesophageal phase 09/25/2014  . History of esophageal stricture 09/25/2014  . Chronic anticoagulation 09/25/2014  . Helicobacter pylori duodenitis 09/25/2014  . Cough 03/14/2014  . Benign paroxysmal positional vertigo 03/14/2014  . Encounter for therapeutic drug monitoring 04/12/2013  . Esophageal stricture from GERD 06/08/2011  . Advance care planning 04/20/2011  . OSA on CPAP 09/22/2010  . HYPERCHOLESTEROLEMIA 08/15/2008  . Gout 08/15/2008  . OBESITY 08/15/2008  . Essential hypertension 08/15/2008  . CAD (coronary artery disease), native coronary artery 08/15/2008  . GASTROESOPHAGEAL REFLUX DISEASE 08/15/2008    Past Surgical History:  Procedure Laterality Date  . BALLOON DILATION N/A 10/22/2014   Procedure: BALLOON DILATION;  Surgeon: [name], MD;  Location: [facility];  Service: Endoscopy;  Laterality: N/A;  . CARDIAC CATHETERIZATION  07/2008  . CARDIAC CATHETERIZATION N/A 10/24/2015   Procedure: Left Heart Cath and Coronary Angiography;  Surgeon: [name], MD;  Location: [facility];  Service: Cardiovascular;  Laterality: N/A;  . CARDIAC CATHETERIZATION  N/A 10/24/2015   Procedure: Coronary Balloon Angioplasty;  Surgeon: [name], MD;  Location: [facility];  Service: Cardiovascular;  Laterality: N/A;  . Carotid Dopplers  09/2009   no sig extracranial stenosis and vertebral arteries had antegrade flow  . CATARACT EXTRACTION W/ INTRAOCULAR LENS  IMPLANT, BILATERAL Bilateral   . ESOPHAGOGASTRODUODENOSCOPY (EGD) WITH ESOPHAGEAL DILATION     "I've had it stretched 3 times" (10/23/2015)  .  ESOPHAGOGASTRODUODENOSCOPY (EGD) WITH ESOPHAGEAL DILATION  06/2011   /medical hx above  (09/09/2017)  . ESOPHAGOGASTRODUODENOSCOPY (EGD) WITH PROPOFOL N/A 10/22/2014   Procedure: ESOPHAGOGASTRODUODENOSCOPY (EGD) WITH PROPOFOL;  Surgeon: [name], MD;  Location: [facility];  Service: Endoscopy;  Laterality: N/A;  . FINGER SURGERY Right 02/1964   "cut little finger off; had it reattached"  . I&D EXTREMITY Right 09/10/2017   Procedure: RIGHT FOOT DEBRIDEMENT;  Surgeon: [name], MD;  Location: [facility];  Service: Orthopedics;  Laterality: Right;  . MRI of brain  09/2009   chronic microvascular ischemia  . OSA Sleep Study     per Dr. Brett Fairy  . SHOULDER ARTHROSCOPY W/ ROTATOR CUFF REPAIR Right 2008        Home Medications    Prior to Admission medications   Medication Sig Start Date End Date Taking? Authorizing Provider  acetaminophen (TYLENOL) 500 MG tablet Take 1,000 mg by mouth as needed for mild pain.    [provider]  allopurinol (ZYLOPRIM) 100 MG tablet Take 1 tablet (100 mg total) by mouth daily. 11/07/17   Tonia Ghent, MD  Besifloxacin HCl (BESIVANCE) 0.6 % SUSP Besivance 0.6 % eye drops,suspension    [provider]  chlorhexidine (PERIDEX) 0.12 % solution chlorhexidine gluconate 0.12 % mouthwash    [provider]  colchicine (COLCRYS) 0.6 MG tablet TAKE 1 TABLET BY MOUTH DAILY IF NEEDED-CAN TAKE 2ND TAB PER DAY IF GOUT FLARE. 03/01/18   Tonia Ghent, MD  Difluprednate (DUREZOL) 0.05 % EMUL Durezol 0.05 % eye drops    [provider]  diltiazem (CARDIZEM CD) 180 MG 24 hr capsule TAKE 1 CAPSULE (180 MG TOTAL) BY MOUTH DAILY. 06/29/17   Sherren Mocha, MD  fluocinonide cream (LIDEX) 0.05 % fluocinonide 0.05 % topical cream    [provider]  fluticasone (FLONASE) 50 MCG/ACT nasal spray Place 1-2 sprays into both nostrils daily as needed for allergies.     [provider]  furosemide (LASIX) 20 MG tablet TAKE 1  TABLET (20 MG TOTAL) BY MOUTH DAILY. 01/31/18   Sherren Mocha, MD  lisinopril (PRINIVIL,ZESTRIL) 20 MG tablet Take 1 tablet (20 mg total) by mouth daily. Please keep upcoming appt in March with Dr. Burt Knack for future refills. Thank you 05/10/18   Sherren Mocha, MD  metoprolol tartrate (LOPRESSOR) 25 MG tablet TAKE 1/2 TABLET BY MOUTH TWICE A DAY 09/28/16   Sherren Mocha, MD  mupirocin ointment (BACTROBAN) 2 % Apply 1 application topically 2 (two) times daily. Apply to the affected area 2 times a day 11/01/17   [name], MD  nepafenac (ILEVRO) 0.3 % ophthalmic suspension Ilevro 0.3 % eye drops,suspension  USE 1 DROP IN RIGHT EYE AT BEDTIME    [provider]  nitroGLYCERIN (NITROSTAT) 0.4 MG SL tablet Place 1 tablet (0.4 mg total) under the tongue every 5 (five) minutes x 3 doses as needed for chest pain. 11/09/15   Lyda Jester M, PA-C  omeprazole (PRILOSEC) 40 MG capsule TAKE 1 CAPSULE BY MOUTH EVERY DAY 06/20/18  [name], MD  predniSONE (DELTASONE) 10 MG tablet Take 2 a day for 5 days, then 1 a day for 5 days, with food. Don't take with aleve/ibuprofen. 06/09/18   Tonia Ghent, MD  warfarin (COUMADIN) 4 MG tablet TAKE AS DIRECTED BY COUMADIN CLINIC 11/23/17   Sherren Mocha, MD    Family History Family History  Problem Relation Age of Onset  . Stomach cancer Mother        died in 110's   . Hypertension Father        died in his 68's pna likely dementia  . Dementia Father   . Pneumonia Father   . Heart disease Brother        S/P CABG  . Hypertension Other   . Prostate cancer Neg Hx   . Colon cancer Neg Hx     Social History Social History   Tobacco Use  . Smoking status: Former Smoker    Packs/day: 0.12    Years: 4.00    Pack years: 0.48    Types: Cigarettes    Last attempt to quit: 03/16/1969    Years since quitting: 49.3  . Smokeless tobacco: Former Systems developer  . Tobacco comment: "chewed when I smoked; smoked 1 pack/week for about 4 years, quit 1971   Substance Use Topics  . Alcohol use: Not Currently    Alcohol/week: 0.0 standard drinks    Comment: 09/09/2017  "Quit all alcohol in 1970's"  . Drug use: Never     Allergies   Penicillins; Atorvastatin; Pravastatin; Hydrocodone; Uloric [febuxostat]; Vancomycin; Biaxin [clarithromycin]; and Doxycycline   Review of Systems Review of Systems  All other systems reviewed and are negative.    Physical Exam Updated Vital Signs BP (!) 144/77   Pulse 95   Temp 99.1 F (37.3 C) (Oral)   Resp 15   SpO2 97%   Physical Exam Vitals signs and nursing note reviewed.  Constitutional:      Appearance: He is well-developed.  HENT:     Head: Normocephalic and atraumatic.  Neck:     Musculoskeletal: Normal range of motion.  Cardiovascular:     Rate and Rhythm: Normal rate. Rhythm irregular.     Heart sounds: Normal heart sounds.  Pulmonary:     Effort: Pulmonary effort is normal. No respiratory distress.     Breath sounds: Normal breath sounds.  Abdominal:     General: There is no distension.     Palpations: Abdomen is soft.     Tenderness: There is no abdominal tenderness.  Musculoskeletal: Normal range of motion.  Skin:    General: Skin is warm and dry.  Neurological:     Mental Status: He is alert and oriented to person, place, and time.  Psychiatric:        Judgment: Judgment normal.      ED Treatments / Results  Labs (all labs ordered are listed, but only abnormal results are displayed) Labs Reviewed  BASIC METABOLIC PANEL - Abnormal; Notable for the following components:      Result Value   Glucose, Bld 106 (*)    Creatinine, Ser 1.31 (*)    GFR calc non Af Amer 49 (*)    GFR calc Af Amer 57 (*)    All other components within normal limits  CBC WITH DIFFERENTIAL/PLATELET  TROPONIN I    EKG None  Radiology Dg Chest Portable 1 View  Result Date: 07/09/2018 CLINICAL DATA:  Cough and shortness of breath with exertion for  1 month. History of coronary artery  disease and atrial fibrillation. EXAM: PORTABLE CHEST 1 VIEW COMPARISON:  Radiographs 05/12/2018 and 09/14/2017. FINDINGS: 1227 hours. Lordotic positioning. The heart size and mediastinal contours are stable. Aortic atherosclerosis, calcified right hilar lymph nodes and scattered calcified granulomas in both lungs are again noted. There is no confluent airspace opacity, pleural effusion pneumothorax. No acute osseous findings are evident. IMPRESSION: Stable chest with sequela of prior granulomatous disease. No acute cardiopulmonary process. Electronically Signed   By: Richardean Sale M.D.   On: 07/09/2018 13:23    Procedures Procedures (including critical care time)  Medications Ordered in ED Medications  ipratropium (ATROVENT HFA) inhaler 2 puff (2 puffs Inhalation Given 07/09/18 1329)  albuterol (VENTOLIN HFA) 108 (90 Base) MCG/ACT inhaler 4 puff (4 puffs Inhalation Given 07/09/18 1329)  AeroChamber Plus Flo-Vu Medium MISC 1 each (1 each Other Given 07/09/18 1331)     Initial Impression / Assessment and Plan / ED Course  I have reviewed the triage vital signs and the nursing notes.  Pertinent labs & imaging results that were available during my care of the patient were reviewed by me and considered in my medical decision making (see chart for details).        Well appearing. Nonspecific complaints. Low suspicion for COVID. Home with bronchodilators. Some wheezing on exam. Outpatient pulmonary follow up. Pt understands to return to the ER for new or worsening symptoms. No indication for abx at this time  Final Clinical Impressions(s) / ED Diagnoses   Final diagnoses:  None    ED Discharge Orders    None       Jola Schmidt, MD 07/09/18 1441

## 2018-07-11 ENCOUNTER — Other Ambulatory Visit: Payer: Self-pay

## 2018-07-11 ENCOUNTER — Ambulatory Visit (INDEPENDENT_AMBULATORY_CARE_PROVIDER_SITE_OTHER): Payer: Medicare Other | Admitting: Family Medicine

## 2018-07-11 ENCOUNTER — Telehealth: Payer: Self-pay

## 2018-07-11 ENCOUNTER — Encounter: Payer: Self-pay | Admitting: Family Medicine

## 2018-07-11 DIAGNOSIS — J069 Acute upper respiratory infection, unspecified: Secondary | ICD-10-CM | POA: Diagnosis not present

## 2018-07-11 DIAGNOSIS — I2510 Atherosclerotic heart disease of native coronary artery without angina pectoris: Secondary | ICD-10-CM

## 2018-07-11 MED ORDER — ALBUTEROL SULFATE (2.5 MG/3ML) 0.083% IN NEBU: 2.5000 mg | INHALATION_SOLUTION | Freq: Four times a day (QID) | RESPIRATORY_TRACT | 1 refills | Status: DC | PRN

## 2018-07-11 NOTE — Telephone Encounter (Signed)
Noted. Thanks.

## 2018-07-11 NOTE — Telephone Encounter (Signed)
[facility] Patient Name: [name] Gender: Male DOB: May 06, 1930 Age: 85 Y 86 M 9 D Return Phone Number: [phone] (Primary), [phone] (Secondary) Address[place] [address] Client [facility] Night - Client Client Site [place] Physician Renford Dills - MD Contact Type Call Who Is Calling Patient / Member / Family / Caregiver Call Type Triage / Clinical Caller Name Dora Sims Relationship To Patient Daughter Return Phone Number [phone] (Primary) Chief Complaint BREATHING - shortness of breath or sounds breathless Reason for Call Symptomatic / Request for [place] states her father has bad headache, cough and congestion. A couple of months a go he was sick. Started him on Claritin. No fever. His breathing is different. Additional Comment He has Afiv, diabetic and has high Blood pressure. On [place] Translation No Nurse Assessment Nurse: Juleen China, RN, But[facility] Penny Date/Time Eilene Ghazi Time): 07/09/2018 10:15:49 AM Confirm and document reason for call. If symptomatic, describe symptoms. ---Caller states her father has bad headache, cough and congestion. A couple of months a go he was sick. Started him on Claritin. Unknown fever. Has the patient had close contact with a person known or suspected to have the novel coronavirus illness OR traveled / lives in area with major community spread (including international travel) in the last 14 days from the onset of symptoms? * If Asymptomatic, screen for exposure and travel within the last 14 days. ---No Does the patient have any new or worsening symptoms? ---Yes Will a triage be completed? ---Yes Related visit to physician within the last 2 weeks? ---Yes Does the PT have any chronic conditions? (i.e. diabetes, asthma, this  includes High risk factors for pregnancy, etc.) ---Yes List chronic conditions. ---Asthma Diabetic AFib Is this a behavioral health or substance abuse call? ---No PLEASE NOTE: All timestamps contained within this report are represented as [place] Standard Time. CONFIDENTIALTY NOTICE: This fax transmission is intended only for the addressee. It contains information that is legally privileged, confidential or otherwise protected from use or disclosure. If you are not the intended recipient, you are strictly prohibited from reviewing, disclosing, copying using or disseminating any of this information or taking any action in reliance on or regarding this information. If you have received this fax in error, please notify us immediately by telephone so that we can arrange for its return to [place]. Phone: [phone], Toll-Free: [phone], Fax: [phone] Page: 2 of 2 Call Id: [id number] Guidelines Guideline Title Affirmed Question Affirmed Notes Nurse Date/Time Eilene Ghazi Time) Coronavirus (COVID-19) - Diagnosed or Suspected HIGH RISK patient (e.g., age > 38 years, diabetes, heart or lung disease, weak immune system) Juleen China, RN, Butch Penny 07/09/2018 10:19:17 AM Disp. Time Eilene Ghazi Time) Disposition Final User 07/09/2018 10:13:50 AM Send to Urgent Queue Artis Flock 07/09/2018 10:26:12 AM Call PCP Now Yes Juleen China, RN, [facility] Disagree/Comply Comply Caller Understands Yes PreDisposition Call Pharmacist Care Advice Given Per Guideline CALL PCP NOW: * You need to discuss this with your doctor (or NP/PA). ALTERNATE DISPOSITION - CALL TELEMEDICINE PROVIDER NOW: * Telemedicine may be your best choice for care during this COVID-19 outbreak. * You should call a telemedicine provider now, if your own healthcare provider is not available. NOTE TO TRIAGER - IF NO PCP, HAVE OTHER HCP RE-TRIAGE THE PATIENT, IF AVAILABLE: * During this COVID-19 pandemic, the medical community is trying to prevent any  unnecessary referrals to medical facilities. Many  patients are very fearful of being exposed to COVID-19 in medical settings. Re-triage by a physician has been shown to reduce ED referrals. Telemedicine is a preferred source of care and triage during this pandemic. Here are options for the triager to consider: * EMERGENCY DEPARTMENT. In some communities, the ED may provide a telephone triage service for patients who have COVID-19 with worsening symptoms. * HOSPITAL AND GROUP PRACTICES. Some hospitals or practices may offer a telemedicine triage service. * TELEMEDICINE SERVICE. There are many national telemedicine companies that are delivering [place] care. FEVER MEDICINES: * For fever relief, take acetaminophen or ibuprofen. * Treat fevers above 101 F (38.3 C). * The goal of fever therapy is to bring the fever down to a comfortable level. Remember that fever medicine usually lowers fever 2-3 F (1-1.5 C). CALL BACK IF: * Chest pain increases or becomes constant. * Difficulty breathing occurs * You become worse. CARE ADVICE given per CORONAVIRUS (COVID-19) - DIAGNOSED OR SUSPECTED (Adult) guideline. Comments User: Jennye Moccasin, RN Date/Time Eilene Ghazi Time): 07/09/2018 10:29:51 AM Transferred to office and explained symptoms. Scheduling a virtual visit for today. Referrals [facility]

## 2018-07-11 NOTE — Progress Notes (Signed)
Virtual visit completed through WebEx or similar program Patient location: home, with daughter.  Provider location: Financial controller at [facility], office   Limitations and rationale for visit method d/w patient.  Patient agreed to proceed.   CC: ER f/u.    HPI:  Call with patient and daughter.  Cough worse on 07/08/2018.  To ER when his chest felt tighter.  CXR noted, ER notes reviewed.    No temps. He has upper airway congestion and cough.    He is not taking claritin.  He isn't taking mucinex yet.  Patient initially had trouble using inhalers at home but felt better after tx at the ER.  I coached him up on inhaler use.  He thinks   His daughter has a neb machine that he can use, if needed.  Head and chest congestion is better as of the time of the call.    Meds and allergies reviewed.   ROS: Per HPI unless specifically indicated in ROS section   NAD Speech wnl  A/P: URI/congestion.   He didn't cough as much last night as the night before.  Would be okay to use plain mucinex if needed, but avoid mucinex D.  It would be easier for patient to use neb machine.  rx sent.  Daughter has machine for patient to use.   Would hold on prednisone and abx for now.   He'll update me as needed.   He isn't SOB.  Still okay for outpatient f/u.   He agrees with plan.

## 2018-07-11 NOTE — Telephone Encounter (Signed)
Per chart review tab pt was seen [name] ED on 07/09/18. Pt already has Doxy[facility]me appt with Dr Damita Dunnings 07/11/18 at 11:30.

## 2018-07-13 NOTE — Assessment & Plan Note (Signed)
URI/congestion.   He didn't cough as much last night as the night before.  Would be okay to use plain mucinex if needed, but avoid mucinex D.  It would be easier for patient to use neb machine.  rx sent.  Daughter has machine for patient to use.   Would hold on prednisone and abx for now.   He'll update me as needed.   He isn't SOB.  Still okay for outpatient f/u.   He agrees with plan.

## 2018-07-21 ENCOUNTER — Other Ambulatory Visit: Payer: Self-pay | Admitting: Family Medicine

## 2018-07-29 ENCOUNTER — Telehealth: Payer: Self-pay

## 2018-07-29 NOTE — Telephone Encounter (Signed)
Called to offer evisit with Dr. Burt Knack next week. The patient declined, stating he feels good. He states he will call when he is ready to schedule.

## 2018-08-01 ENCOUNTER — Telehealth: Payer: Self-pay | Admitting: Physician Assistant

## 2018-08-01 NOTE — Telephone Encounter (Signed)
New message      Follow up appt made on 08-10-18 with vin per Dr Antionette Char nurse.  Patient gave consent for phone visit.  He said that he does not text or receive text messages.  YOUR CARDIOLOGY TEAM HAS ARRANGED FOR AN E-VISIT FOR YOUR APPOINTMENT - PLEASE REVIEW IMPORTANT INFORMATION BELOW SEVERAL DAYS PRIOR TO YOUR APPOINTMENT  Due to the recent COVID-19 pandemic, we are transitioning in-person office visits to tele-medicine visits in an effort to decrease unnecessary exposure to our patients, their families, and staff. These visits are billed to your insurance just like a normal visit is. We also encourage you to sign up for MyChart if you have not already done so. You will need a smartphone if possible. For patients that do not have this, we can still complete the visit using a regular telephone but do prefer a smartphone to enable video when possible. You may have a family member that lives with you that can help. If possible, we also ask that you have a blood pressure cuff and scale at home to measure your blood pressure, heart rate and weight prior to your scheduled appointment. Patients with clinical needs that need an in-person evaluation and testing will still be able to come to the office if absolutely necessary. If you have any questions, feel free to call our office.     YOUR PROVIDER WILL BE USING THE FOLLOWING PLATFORM TO COMPLETE YOUR VISIT: Phone   IF USING MYCHART - How to Download the MyChart App to Your SmartPhone   - If Apple, go to [employer] and type in MyChart in the search bar and download the app. If Android, ask patient to go to [employer] and type in [place] in the search bar and download the app. The app is free but as with any other app downloads, your phone may require you to verify saved payment information or Apple/Android password.  - You will need to then log into the app with your MyChart username and password, and select [facility] as your healthcare  provider to link the account.  - When it is time for your visit, go to the MyChart app, find appointments, and click Begin Video Visit. Be sure to Select Allow for your device to access the Microphone and Camera for your visit. You will then be connected, and your provider will be with you shortly.  **If you have any issues connecting or need assistance, please contact MyChart service desk [phone])**  **If using a computer, in order to ensure the best quality for your visit, you will need to use either of the following Internet Browsers: Insurance underwriter or Microsoft Edge**   IF USING DOXIMITY or [email or web address].ME - The staff will give you instructions on receiving your link to join the meeting the day of your visit.      2-3 DAYS BEFORE YOUR APPOINTMENT  You will receive a telephone call from one of our [facility] team members - your caller ID may say "Unknown caller." If this is a video visit, we will walk you through how to get the video launched on your phone. We will remind you check your blood pressure, heart rate and weight prior to your scheduled appointment. If you have an Apple Watch or Kardia, please upload any pertinent ECG strips the day before or morning of your appointment to [facility]. Our staff will also make sure you have reviewed the consent and agree to move forward with your scheduled tele-health  visit.     THE DAY OF YOUR APPOINTMENT  Approximately 15 minutes prior to your scheduled appointment, you will receive a telephone call from one of [facility] team - your caller ID may say "Unknown caller."  Our staff will confirm medications, vital signs for the day and any symptoms you may be experiencing. Please have this information available prior to the time of visit start. It may also be helpful for you to have a pad of paper and pen handy for any instructions given during your visit. They will also walk you through joining the smartphone meeting if this is a video  visit.    CONSENT FOR TELE-HEALTH VISIT - PLEASE REVIEW  I hereby voluntarily request, consent and authorize [facility] and its employed or contracted physicians, physician assistants, nurse practitioners or other licensed health care professionals (the Practitioner), to provide me with telemedicine health care services (the Services") as deemed necessary by the treating Practitioner. I acknowledge and consent to receive the Services by the Practitioner via telemedicine. I understand that the telemedicine visit will involve communicating with the Practitioner through live audiovisual communication technology and the disclosure of certain medical information by electronic transmission. I acknowledge that I have been given the opportunity to request an in-person assessment or other available alternative prior to the telemedicine visit and am voluntarily participating in the telemedicine visit.  I understand that I have the right to withhold or withdraw my consent to the use of telemedicine in the course of my care at any time, without affecting my right to future care or treatment, and that the Practitioner or I may terminate the telemedicine visit at any time. I understand that I have the right to inspect all information obtained and/or recorded in the course of the telemedicine visit and may receive copies of available information for a reasonable fee.  I understand that some of the potential risks of receiving the Services via telemedicine include:   Delay or interruption in medical evaluation due to technological equipment failure or disruption;  Information transmitted may not be sufficient (e.g. poor resolution of images) to allow for appropriate medical decision making by the Practitioner; and/or   In rare instances, security protocols could fail, causing a breach of personal health information.  Furthermore, I acknowledge that it is my responsibility to provide information about my medical  history, conditions and care that is complete and accurate to the best of my ability. I acknowledge that Practitioner's advice, recommendations, and/or decision may be based on factors not within their control, such as incomplete or inaccurate data provided by me or distortions of diagnostic images or specimens that may result from electronic transmissions. I understand that the practice of medicine is not an exact science and that Practitioner makes no warranties or guarantees regarding treatment outcomes. I acknowledge that I will receive a copy of this consent concurrently upon execution via email to the email address I last provided but may also request a printed copy by calling the office of [facility].    I understand that my insurance will be billed for this visit.   I have read or had this consent read to me.  I understand the contents of this consent, which adequately explains the benefits and risks of the Services being provided via telemedicine.   I have been provided ample opportunity to ask questions regarding this consent and the Services and have had my questions answered to my satisfaction.  I give my informed consent for the services  to be provided through the use of telemedicine in my medical care  By participating in this telemedicine visit I agree to the above.

## 2018-08-03 ENCOUNTER — Other Ambulatory Visit: Payer: Self-pay | Admitting: Cardiovascular Disease

## 2018-08-03 DIAGNOSIS — I2510 Atherosclerotic heart disease of native coronary artery without angina pectoris: Secondary | ICD-10-CM

## 2018-08-03 DIAGNOSIS — R42 Dizziness and giddiness: Secondary | ICD-10-CM

## 2018-08-03 DIAGNOSIS — I4819 Other persistent atrial fibrillation: Secondary | ICD-10-CM

## 2018-08-06 ENCOUNTER — Other Ambulatory Visit: Payer: Self-pay | Admitting: Cardiovascular Disease

## 2018-08-10 ENCOUNTER — Other Ambulatory Visit: Payer: Self-pay

## 2018-08-10 ENCOUNTER — Telehealth (INDEPENDENT_AMBULATORY_CARE_PROVIDER_SITE_OTHER): Payer: Medicare Other | Admitting: Physician Assistant

## 2018-08-10 ENCOUNTER — Encounter: Payer: Self-pay | Admitting: Physician Assistant

## 2018-08-10 VITALS — BP 135/70 | HR 93 | Ht 66.0 in | Wt 210.0 lb

## 2018-08-10 DIAGNOSIS — I4819 Other persistent atrial fibrillation: Secondary | ICD-10-CM | POA: Diagnosis not present

## 2018-08-10 DIAGNOSIS — E785 Hyperlipidemia, unspecified: Secondary | ICD-10-CM

## 2018-08-10 DIAGNOSIS — I10 Essential (primary) hypertension: Secondary | ICD-10-CM

## 2018-08-10 DIAGNOSIS — I2510 Atherosclerotic heart disease of native coronary artery without angina pectoris: Secondary | ICD-10-CM

## 2018-08-10 NOTE — Patient Instructions (Signed)
Medication Instructions:  Your physician recommends that you continue on your current medications as directed. Please refer to the Current Medication list given to you today. If you need a refill on your cardiac medications before your next appointment, please call your pharmacy.   Lab work: NONE If you have labs (blood work) drawn today and your tests are completely normal, you will receive your results only by: [name] MyChart Message (if you have MyChart) OR . A paper copy in the mail If you have any lab test that is abnormal or we need to change your treatment, we will call you to review the results.  Testing/Procedures: NONE  Follow-Up: At [facility], you and your health needs are our priority.  As part of our continuing mission to provide you with exceptional heart care, we have created designated Provider Care Teams.  These Care Teams include your primary Cardiologist (physician) and Advanced Practice Providers (APPs -  Physician Assistants and Nurse Practitioners) who all work together to provide you with the care you need, when you need it. You will need a follow up appointment in:  12 months.  Please call our office 2 months in advance to schedule this appointment.  You may see Sherren Mocha, MD or one of the following Advanced Practice Providers on your designated Care Team: Richardson Dopp, PA-C [place], [place] . Daune Perch, NP

## 2018-08-10 NOTE — Progress Notes (Signed)
Virtual Visit via Telephone Note   This visit type was conducted due to national recommendations for restrictions regarding the COVID-19 Pandemic (e.g. social distancing) in an effort to limit this patient's exposure and mitigate transmission in our community.  Due to his co-morbid illnesses, this patient is at least at moderate risk for complications without adequate follow up.  This format is felt to be most appropriate for this patient at this time.  The patient did not have access to video technology/had technical difficulties with video requiring transitioning to audio format only (telephone).  All issues noted in this document were discussed and addressed.  No physical exam could be performed with this format.  Please refer to the patient's chart for his  consent to telehealth for [facility].   Date:  08/10/2018   ID:  [name], DOB [birth year], MRN [id number]  Patient Location: Home Provider Location: Office  PCP:  Tonia Ghent, MD  Cardiologist: Dr. Burt Knack   Evaluation Performed:  Follow-Up Visit  Chief Complaint:  15 months follow up for CAD and afib   History of Present Illness:    [name] is a 85 y.o. male with hx of CAD, persistent atrial fibrillation, DM, CKD III, OSA, HTN and HLD seen for follow up.   The patient presented in 2017 with acute coronary syndrome and was found to have occlusion of the small obtuse marginal branch treated with balloon angioplasty. His LV function has been preserved with an LVEF of 55-60%. The patient is chronically anticoagulated with warfarin for atrial fibrillation.  He was doing well on cardiac stand point when last seen by Dr. Rockey Situ 05/2017.  He continues to work at age 60. Walks occasionally. Denies chest pain, dyspnea, palpitations, dizziness, orthopnea, PND, syncope, LE edema or melena. Compliant with his medications.   The patient does not have symptoms concerning for COVID-19 infection (fever, chills, cough, or new  shortness of breath).    Past Medical History:  Diagnosis Date  . Bradycardia    a. nocturnal with pauses overnight on tele likely due to OSA 10/2015.  [name] CKD (chronic kidney disease), stage III ([place])   . Coronary artery disease    a. prev nonobst. b. LHC 10/24/15: occlusion of small diffusely diseased OM2 s/p balloon angioplasty, mild nonobstructive disease of mLAD and mRCA, normal LVEDP  . Diabetes mellitus without complication ([place])   . Diverticulosis   . Esophageal reflux   . Esophageal stricture from GERD 06/08/2011   With stricture at GE junction on EGD, dilated 06/2011   . Gastric ulcer    on EGD 2013  . Gout, unspecified    "on daily RX" (09/09/2017)  . History of blood transfusion 02/1964   "when I had right little finger cut off"  . Hyperlipidemia   . Hypertension   . Obesity, unspecified   . Orthopnea   . OSA on CPAP   . Paroxysmal nocturnal dyspnea   . Persistent atrial fibrillation   . Vertigo    Past Surgical History:  Procedure Laterality Date  . BALLOON DILATION N/A 10/22/2014   Procedure: BALLOON DILATION;  Surgeon: [name], MD;  Location: [facility];  Service: Endoscopy;  Laterality: N/A;  . CARDIAC CATHETERIZATION  07/2008  . CARDIAC CATHETERIZATION N/A 10/24/2015   Procedure: Left Heart Cath and Coronary Angiography;  Surgeon: [name], MD;  Location: [facility] CV [facility];  Service: Cardiovascular;  Laterality: N/A;  . CARDIAC CATHETERIZATION N/A 10/24/2015   Procedure: Coronary Balloon  Angioplasty;  Surgeon: [name], MD;  Location: [facility];  Service: Cardiovascular;  Laterality: N/A;  . Carotid Dopplers  09/2009   no sig extracranial stenosis and vertebral arteries had antegrade flow  . CATARACT EXTRACTION W/ INTRAOCULAR LENS  IMPLANT, BILATERAL Bilateral   . ESOPHAGOGASTRODUODENOSCOPY (EGD) WITH ESOPHAGEAL DILATION     "I've had it stretched 3 times" (10/23/2015)  . ESOPHAGOGASTRODUODENOSCOPY (EGD) WITH ESOPHAGEAL DILATION  06/2011    /medical hx above  (09/09/2017)  . ESOPHAGOGASTRODUODENOSCOPY (EGD) WITH PROPOFOL N/A 10/22/2014   Procedure: ESOPHAGOGASTRODUODENOSCOPY (EGD) WITH PROPOFOL;  Surgeon: [name], MD;  Location: [facility];  Service: Endoscopy;  Laterality: N/A;  . FINGER SURGERY Right 02/1964   "cut little finger off; had it reattached"  . I&D EXTREMITY Right 09/10/2017   Procedure: RIGHT FOOT DEBRIDEMENT;  Surgeon: [name], MD;  Location: [facility];  Service: Orthopedics;  Laterality: Right;  . MRI of brain  09/2009   chronic microvascular ischemia  . OSA Sleep Study     per Dr. Brett Fairy  . SHOULDER ARTHROSCOPY W/ ROTATOR CUFF REPAIR Right 2008     Current Meds  Medication Sig  . acetaminophen (TYLENOL) 500 MG tablet Take 1,000 mg by mouth as needed for mild pain.  [name] albuterol (PROVENTIL) (2.5 MG/3ML) 0.083% nebulizer solution Take 3 mLs (2.5 mg total) by nebulization every 6 (six) hours as needed (cough or chest congestion).  [name] allopurinol (ZYLOPRIM) 100 MG tablet Take 1 tablet (100 mg total) by mouth daily.  . colchicine 0.6 MG tablet TAKE 1 TABLET BY MOUTH DAILY IF NEEDED-CAN TAKE 2ND TAB PER DAY IF GOUT FLARE.  [name] diltiazem (CARDIZEM CD) 180 MG 24 hr capsule TAKE 1 CAPSULE (180 MG TOTAL) BY MOUTH DAILY.  . furosemide (LASIX) 20 MG tablet TAKE 1 TABLET (20 MG TOTAL) BY MOUTH DAILY.  [name] lisinopril (ZESTRIL) 20 MG tablet TAKE 1 TABLET BY MOUTH DAILY. PLEASE KEEP UPCOMING APPT IN MARCH WITH DR. Burt Knack FOR FUTURE REFILLS  . metoprolol tartrate (LOPRESSOR) 25 MG tablet TAKE 1/2 TABLET BY MOUTH TWICE A DAY  . nitroGLYCERIN (NITROSTAT) 0.4 MG SL tablet Place 1 tablet (0.4 mg total) under the tongue every 5 (five) minutes x 3 doses as needed for chest pain.  [name] omeprazole (PRILOSEC) 40 MG capsule TAKE 1 CAPSULE BY MOUTH EVERY DAY  . warfarin (COUMADIN) 4 MG tablet TAKE AS DIRECTED BY COUMADIN CLINIC (Patient taking differently: Take 4-6 mg by mouth See admin instructions. On Monday and Thursday take 6 mg  On Tuesday, Wednesday, Friday, Saturday, Sunday take 4 mg)     Allergies:   Penicillins; Atorvastatin; Pravastatin; Hydrocodone; Uloric [febuxostat]; Vancomycin; Biaxin [clarithromycin]; and Doxycycline   Social History   Tobacco Use  . Smoking status: Former Smoker    Packs/day: 0.12    Years: 4.00    Pack years: 0.48    Types: Cigarettes    Last attempt to quit: 03/16/1969    Years since quitting: 49.4  . Smokeless tobacco: Former Systems developer  . Tobacco comment: "chewed when I smoked; smoked 1 pack/week for about 4 years, quit 1971  Substance Use Topics  . Alcohol use: Not Currently    Alcohol/week: 0.0 standard drinks    Comment: 09/09/2017  "Quit all alcohol in 1970's"  . Drug use: Never     Family Hx: The patient's family history includes Dementia in his father; Heart disease in his brother; Hypertension in his father and another family member; Pneumonia in his father; Stomach cancer  in his mother. There is no history of Prostate cancer or Colon cancer.  ROS:   Please see the history of present illness.    All other systems reviewed and are negative.   Prior CV studies:   The following studies were reviewed today:  Coronary Balloon Angioplasty  10/2015  Left Heart Cath and Coronary Angiography  Conclusion     Mid RCA lesion, 15 %stenosed.  Ost 2nd Mrg to 2nd Mrg lesion, 50 %stenosed.  Mid LAD lesion, 30 %stenosed.  2nd Mrg lesion, 100 %stenosed.  Post intervention, there is a 30% residual stenosis.  LV end diastolic pressure is normal.   1.  Occlusion of small, diffusely diseased OM2 branch. 2.  Mild, non-obstructive coronary artery disease involving the mid LAD and mid RCA. 3.  Normal left ventricular filling pressure (LVEDP 13 mmHg). 4.  Successful balloon angioplasty of OM2 using 2.0 x 12 mm balloon with improvement in stenosis from 100% to 30% with restoration of TIMI-3 flow.  Stent was not placed due to the small vessel size with diffuse disease extending to  the ostium.  Plan: 1.  Dual antiplatelet therapy for at least 1 month.  Given that patient is on warfarin as well, would recommend switching from ticagrelor (loaded in cath lab) to clopidogrel to reduce risk for bleeding complications. 2.  Restart warfarin tomorrow. 3.  Aggressive secondary prevention. 4.  Continue IV hydration x 6 hours, with close follow-up of renal status.   Echo 10/2015 Study Conclusions  - Left ventricle: The cavity size was normal. Wall thickness was   normal. Systolic function was normal. The estimated ejection   fraction was in the range of 55% to 60%. Wall motion was normal;   there were no regional wall motion abnormalities. - Mitral valve: Moderately calcified annulus. Mildly thickened,   mildly calcified leaflets . Valve area by continuity equation   (using LVOT flow): 1.28 cm^2. - Left atrium: The atrium was moderately dilated. - Right ventricle: The cavity size was mildly dilated. Systolic   function was mildly reduced.   Labs/Other Tests and Data Reviewed:    EKG:  No ECG reviewed.  Recent Labs: 09/09/2017: Magnesium 2.2 09/14/2017: ALT 34 07/09/2018: BUN 18; Creatinine, Ser 1.31; Hemoglobin 15.4; Platelets 154; Potassium 4.2; Sodium 137   Recent Lipid Panel Lab Results  Component Value Date/Time   CHOL 185 02/24/2016 09:15 AM   TRIG 282.0 (H) 02/24/2016 09:15 AM   HDL 26.30 (L) 02/24/2016 09:15 AM   CHOLHDL 7 02/24/2016 09:15 AM   LDLCALC 82 10/24/2015 04:30 AM   LDLDIRECT 106.0 02/24/2016 09:15 AM    Wt Readings from Last 3 Encounters:  08/10/18 210 lb (95.3 kg)  05/24/18 213 lb 7 oz (96.8 kg)  05/12/18 214 lb 8 oz (97.3 kg)     Objective:    Vital Signs:  BP 135/70   Pulse 93   Ht 5\' 6"  (1.676 m)   Wt 210 lb (95.3 kg)   BMI 33.89 kg/m    VITAL SIGNS:  reviewed GEN:  no acute distress PSYCH:  normal affect  ASSESSMENT & PLAN:    1. CAD No angina. Continue BB and statin. Not on ASA due to need of anticoagulation.   2.  Persistent atrial fibrillation Asymptomatic. No bleeding issue. Compliant with his medications.   3. HTN - Blood pressure well controlled on current medications.   4. HLD - Hx of statin intolerance. Followed by PCP.   COVID-19 Education: The signs and symptoms of  COVID-19 were discussed with the patient and how to seek care for testing (follow up with PCP or arrange E-visit).  The importance of social distancing was discussed today.  Time:   Today, I have spent 7 minutes with the patient with telehealth technology discussing the above problems.     Medication Adjustments/Labs and Tests Ordered: Current medicines are reviewed at length with the patient today.  Concerns regarding medicines are outlined above.   Tests Ordered: No orders of the defined types were placed in this encounter.   Medication Changes: No orders of the defined types were placed in this encounter.   Disposition:  Follow up in 1 year(s)  Signed, Leanor Kail, PA  08/10/2018 10:30 AM    [facility]

## 2018-08-18 ENCOUNTER — Telehealth: Payer: Self-pay

## 2018-08-18 NOTE — Telephone Encounter (Signed)

## 2018-08-26 ENCOUNTER — Other Ambulatory Visit: Payer: Self-pay

## 2018-08-26 ENCOUNTER — Ambulatory Visit (INDEPENDENT_AMBULATORY_CARE_PROVIDER_SITE_OTHER): Payer: Medicare Other | Admitting: *Deleted

## 2018-08-26 DIAGNOSIS — Z5181 Encounter for therapeutic drug level monitoring: Secondary | ICD-10-CM | POA: Diagnosis not present

## 2018-08-26 DIAGNOSIS — I4819 Other persistent atrial fibrillation: Secondary | ICD-10-CM

## 2018-08-26 LAB — POCT INR: INR: 2.5 (ref 2.0–3.0)

## 2018-08-26 NOTE — Patient Instructions (Signed)
Description   Continue on same dosage 1 tablet daily except 1.5 tablets each Sundays and Thursdays. Recheck INR in 8 weeks.

## 2018-09-25 ENCOUNTER — Other Ambulatory Visit: Payer: Self-pay | Admitting: Cardiovascular Disease

## 2018-09-29 ENCOUNTER — Other Ambulatory Visit: Payer: Self-pay | Admitting: *Deleted

## 2018-09-29 MED ORDER — METOPROLOL TARTRATE 25 MG PO TABS: 12.5000 mg | ORAL_TABLET | Freq: Two times a day (BID) | ORAL | 3 refills | Status: DC

## 2018-10-06 ENCOUNTER — Other Ambulatory Visit: Payer: Self-pay | Admitting: Cardiovascular Disease

## 2018-10-06 ENCOUNTER — Other Ambulatory Visit: Payer: Self-pay | Admitting: Family Medicine

## 2018-10-06 ENCOUNTER — Other Ambulatory Visit: Payer: Self-pay | Admitting: Internal Medicine

## 2018-10-06 DIAGNOSIS — R1310 Dysphagia, unspecified: Secondary | ICD-10-CM

## 2018-10-06 DIAGNOSIS — M170 Bilateral primary osteoarthritis of knee: Secondary | ICD-10-CM | POA: Diagnosis not present

## 2018-10-06 DIAGNOSIS — M19012 Primary osteoarthritis, left shoulder: Secondary | ICD-10-CM | POA: Diagnosis not present

## 2018-10-06 DIAGNOSIS — K222 Esophageal obstruction: Secondary | ICD-10-CM

## 2018-10-06 DIAGNOSIS — R1319 Other dysphagia: Secondary | ICD-10-CM

## 2018-10-06 DIAGNOSIS — M109 Gout, unspecified: Secondary | ICD-10-CM

## 2018-10-10 ENCOUNTER — Other Ambulatory Visit: Payer: Self-pay | Admitting: *Deleted

## 2018-10-10 MED ORDER — WARFARIN SODIUM 4 MG PO TABS: ORAL_TABLET | ORAL | 1 refills | Status: DC

## 2018-10-21 ENCOUNTER — Other Ambulatory Visit: Payer: Self-pay

## 2018-10-21 ENCOUNTER — Ambulatory Visit (INDEPENDENT_AMBULATORY_CARE_PROVIDER_SITE_OTHER): Payer: Medicare Other | Admitting: *Deleted

## 2018-10-21 DIAGNOSIS — I4819 Other persistent atrial fibrillation: Secondary | ICD-10-CM

## 2018-10-21 DIAGNOSIS — Z5181 Encounter for therapeutic drug level monitoring: Secondary | ICD-10-CM | POA: Diagnosis not present

## 2018-10-21 LAB — POCT INR: INR: 3.3 — AB (ref 2.0–3.0)

## 2018-10-21 NOTE — Patient Instructions (Addendum)
Description   Do not take any Coumadin tomorrow then continue on same dosage 1 tablet daily except 1.5 tablets each Sundays and Thursdays. Recheck INR in 5 weeks.

## 2018-10-31 DIAGNOSIS — L570 Actinic keratosis: Secondary | ICD-10-CM | POA: Diagnosis not present

## 2018-10-31 DIAGNOSIS — Z85828 Personal history of other malignant neoplasm of skin: Secondary | ICD-10-CM | POA: Diagnosis not present

## 2018-10-31 DIAGNOSIS — D1801 Hemangioma of skin and subcutaneous tissue: Secondary | ICD-10-CM | POA: Diagnosis not present

## 2018-10-31 DIAGNOSIS — L820 Inflamed seborrheic keratosis: Secondary | ICD-10-CM | POA: Diagnosis not present

## 2018-10-31 DIAGNOSIS — C4441 Basal cell carcinoma of skin of scalp and neck: Secondary | ICD-10-CM | POA: Diagnosis not present

## 2018-10-31 DIAGNOSIS — L821 Other seborrheic keratosis: Secondary | ICD-10-CM | POA: Diagnosis not present

## 2018-11-02 ENCOUNTER — Telehealth: Payer: Self-pay

## 2018-11-02 NOTE — Telephone Encounter (Signed)
Spoke with patient in regards to Form we received from [facility] for documentation in regards to oxygen and/or wheelchair use. Patient states he has not and does not use use Oxygen or wheelchair. He uses CPAP but he does not get it from them. Form faxed back with these notes from patient. Will await if anything further is needed.

## 2018-11-07 DIAGNOSIS — Z85828 Personal history of other malignant neoplasm of skin: Secondary | ICD-10-CM | POA: Diagnosis not present

## 2018-11-07 DIAGNOSIS — C4441 Basal cell carcinoma of skin of scalp and neck: Secondary | ICD-10-CM | POA: Diagnosis not present

## 2018-11-08 ENCOUNTER — Other Ambulatory Visit: Payer: Self-pay

## 2018-11-08 ENCOUNTER — Encounter: Payer: Self-pay | Admitting: Sports Medicine

## 2018-11-08 ENCOUNTER — Ambulatory Visit (INDEPENDENT_AMBULATORY_CARE_PROVIDER_SITE_OTHER): Payer: Medicare Other | Admitting: Sports Medicine

## 2018-11-08 DIAGNOSIS — I2510 Atherosclerotic heart disease of native coronary artery without angina pectoris: Secondary | ICD-10-CM

## 2018-11-08 DIAGNOSIS — M2041 Other hammer toe(s) (acquired), right foot: Secondary | ICD-10-CM | POA: Diagnosis not present

## 2018-11-08 DIAGNOSIS — B351 Tinea unguium: Secondary | ICD-10-CM | POA: Diagnosis not present

## 2018-11-08 DIAGNOSIS — S90424A Blister (nonthermal), right lesser toe(s), initial encounter: Secondary | ICD-10-CM | POA: Diagnosis not present

## 2018-11-08 DIAGNOSIS — M2042 Other hammer toe(s) (acquired), left foot: Secondary | ICD-10-CM

## 2018-11-08 DIAGNOSIS — E1151 Type 2 diabetes mellitus with diabetic peripheral angiopathy without gangrene: Secondary | ICD-10-CM

## 2018-11-08 NOTE — Progress Notes (Signed)
Subjective: [name] is a 85 y.o. male patient seen in office for evaluation of blister to right 2nd toe. Patient is changing the dressing using peroxide and antibiotic cream with help of daughter. Noticed 1-3 weeks ago from nail rubbing skin, Patient nausea/fever/vomiting/chills/night sweats/shortness of breath/pain. Patient has no other pedal complaints at this time.  Patient Active Problem List   Diagnosis Date Noted  . Acute non-recurrent maxillary sinusitis 05/25/2018  . Fever 12/05/2017  . Achilles tendinitis 12/05/2017  . Idiopathic chronic gout, unspecified site, without tophus (tophi) 10/06/2017  . Cutaneous abscess of right foot   . Septic arthritis ([place]) 09/09/2017  . URI (upper respiratory infection) 11/24/2016  . Atypical angina ([place]) 11/08/2015  . Acute diastolic heart failure ([place])   . CAD S/P percutaneous coronary angioplasty   . Chronic atrial fibrillation   . CKD (chronic kidney disease), stage III ([place])   . Persistent atrial fibrillation   . Hypertension   . Hyperlipidemia   . Bradycardia   . Back pain 05/24/2015  . Triceps tendonitis 05/24/2015  . Diabetes mellitus without complication ([place]) 10/62/6948  . Dysphagia, pharyngoesophageal phase 09/25/2014  . History of esophageal stricture 09/25/2014  . Chronic anticoagulation 09/25/2014  . Helicobacter pylori duodenitis 09/25/2014  . Cough 03/14/2014  . Benign paroxysmal positional vertigo 03/14/2014  . Encounter for therapeutic drug monitoring 04/12/2013  . Esophageal stricture from GERD 06/08/2011  . Advance care planning 04/20/2011  . OSA on CPAP 09/22/2010  . HYPERCHOLESTEROLEMIA 08/15/2008  . Gout 08/15/2008  . OBESITY 08/15/2008  . Essential hypertension 08/15/2008  . CAD (coronary artery disease), native coronary artery 08/15/2008  . GASTROESOPHAGEAL REFLUX DISEASE 08/15/2008   Current Outpatient Medications on File Prior to Visit  Medication Sig Dispense Refill  . acetaminophen (TYLENOL) 500  MG tablet Take 1,000 mg by mouth as needed for mild pain.    [name] albuterol (PROVENTIL) (2.5 MG/3ML) 0.083% nebulizer solution Take 3 mLs (2.5 mg total) by nebulization every 6 (six) hours as needed (cough or chest congestion). 75 mL 1  . allopurinol (ZYLOPRIM) 100 MG tablet TAKE 1 TABLET BY MOUTH EVERY DAY 90 tablet 3  . colchicine 0.6 MG tablet TAKE 1 TABLET BY MOUTH DAILY IF NEEDED-CAN TAKE 2ND TAB PER DAY IF GOUT FLARE. 60 tablet 1  . diltiazem (CARDIZEM CD) 180 MG 24 hr capsule TAKE 1 CAPSULE (180 MG TOTAL) BY MOUTH DAILY. 90 capsule 2  . furosemide (LASIX) 20 MG tablet TAKE 1 TABLET (20 MG TOTAL) BY MOUTH DAILY. 90 tablet 1  . lisinopril (ZESTRIL) 20 MG tablet TAKE 1 TABLET BY MOUTH DAILY. PLEASE KEEP UPCOMING APPT IN MARCH WITH DR. Burt Knack FOR FUTURE REFILLS 90 tablet 3  . metoprolol tartrate (LOPRESSOR) 25 MG tablet Take 0.5 tablets (12.5 mg total) by mouth 2 (two) times daily. 30 tablet 3  . mupirocin ointment (BACTROBAN) 2 %     . nitroGLYCERIN (NITROSTAT) 0.4 MG SL tablet Place 1 tablet (0.4 mg total) under the tongue every 5 (five) minutes x 3 doses as needed for chest pain. 25 tablet 2  . omeprazole (PRILOSEC) 40 MG capsule TAKE 1 CAPSULE BY MOUTH EVERY DAY 30 capsule 0  . warfarin (COUMADIN) 4 MG tablet TAKE AS DIRECTED BY COUMADIN CLINIC 120 tablet 1   No current facility-administered medications on file prior to visit.    Allergies  Allergen Reactions  . Penicillins Shortness Of Breath and Swelling    Patient tolerated Rocephin injection 2019  PATIENT HAS HAD A PCN REACTION WITH  IMMEDIATE RASH, FACIAL/TONGUE/THROAT SWELLING, SOB, OR LIGHTHEADEDNESS WITH HYPOTENSION:  #  #  YES  #  Has patient had a PCN reaction causing severe rash involving mucus membranes or skin necrosis: NO Has patient had a PCN reaction that required hospitalization NO Has patient had a PCN reaction occurring within the last 10 years: NO If all of the above answers are "NO", then may proceed with Cephalosporin  use.   . Atorvastatin Other (See Comments)    No energy, just felt bad    . Pravastatin Other (See Comments)    myalgias  . Hydrocodone Other (See Comments)    "swimmy headed" after use  . Uloric [Febuxostat] Other (See Comments)    Would avoid, worsening gout sx with use.   . Vancomycin Other (See Comments)    Red man syndrome- tolerated with slow run and benadryl use  . Biaxin [Clarithromycin] Diarrhea  . Doxycycline Other (See Comments)    GI upset    Recent Results (from the past 2160 hour(s))  POCT INR     Status: None   Collection Time: 08/26/18  8:36 AM  Result Value Ref Range   INR 2.5 2.0 - 3.0  POCT INR     Status: Abnormal   Collection Time: 10/21/18  8:37 AM  Result Value Ref Range   INR 3.3 (A) 2.0 - 3.0    Objective: There were no vitals filed for this visit.  General: Patient is awake, alert, oriented x 3 and in no acute distress.  Dermatology: Skin is warm and dry bilateral with a ruptured blister at medial right 2nd toe with granular base. The ulceration does not probe to bone. There is no malodor, no active drainage, no erythema, no edema. No acute signs of infection.   Vascular: Dorsalis Pedis pulse = 1/4 Bilateral,  Posterior Tibial pulse = 0/4 Bilateral,  Capillary Fill Time < 5 seconds  Neurologic: Protective sensation diminished bilateral  Musculosketal: There is bunion and hammertoe deformity. No Pain with palpation to ulcerated area. No pain with compression to calves bilateral.  No results for input(s): GRAMSTAIN, LABORGA in the last 8760 hours.  Assessment and Plan:  Problem List Items Addressed This Visit    None    Visit Diagnoses    Blister of toe of right foot, initial encounter    -  Primary   Relevant Orders   WOUND CULTURE   Diabetes mellitus type 2 with peripheral artery disease (HCC)       Relevant Orders   WOUND CULTURE   Onychomycosis       Relevant Medications   mupirocin ointment (BACTROBAN) 2 %   Other Relevant Orders    WOUND CULTURE   Hammer toes of both feet       Relevant Orders   WOUND CULTURE       -Examined patient and discussed the progression of the wound and treatment alternatives. -Cleansed blister and removed loose skin using sterile tissue nipper -Applied betadine and dry sterile dressing and instructed patient to continue with daily dressings at home consisting of same and may leave open to air at night -Wound culture obtained will call if need PO antibiotics by mouth -Right 1st toenail trimmed using sterile nipper without incident at no charge - Advised patient to go to the ER or return to office if the wound worsens or if constitutional symptoms are present. -Patient to return to office in 2-3 weeks for follow up care and evaluation or sooner if problems arise.  Will eval if patient can use toe spacer at this visit.   Landis Martins, DPM

## 2018-11-12 ENCOUNTER — Other Ambulatory Visit: Payer: Self-pay | Admitting: Internal Medicine

## 2018-11-12 DIAGNOSIS — R1319 Other dysphagia: Secondary | ICD-10-CM

## 2018-11-12 DIAGNOSIS — K222 Esophageal obstruction: Secondary | ICD-10-CM

## 2018-11-12 DIAGNOSIS — R1310 Dysphagia, unspecified: Secondary | ICD-10-CM

## 2018-11-13 LAB — WOUND CULTURE
MICRO NUMBER:: 808929
SPECIMEN QUALITY:: ADEQUATE

## 2018-11-14 ENCOUNTER — Other Ambulatory Visit: Payer: Self-pay | Admitting: Internal Medicine

## 2018-11-14 ENCOUNTER — Telehealth: Payer: Self-pay | Admitting: *Deleted

## 2018-11-14 DIAGNOSIS — K222 Esophageal obstruction: Secondary | ICD-10-CM

## 2018-11-14 DIAGNOSIS — R1319 Other dysphagia: Secondary | ICD-10-CM

## 2018-11-14 DIAGNOSIS — R1310 Dysphagia, unspecified: Secondary | ICD-10-CM

## 2018-11-14 MED ORDER — SULFAMETHOXAZOLE-TRIMETHOPRIM 400-80 MG PO TABS: 1.0000 | ORAL_TABLET | Freq: Two times a day (BID) | ORAL | 0 refills | Status: DC

## 2018-11-14 NOTE — Telephone Encounter (Signed)
-----   Message from Landis Martins, [place] sent at 11/13/2018  7:19 PM EDT ----- Wound culture is +. Please send Bactrim 80/400mg  BID, 28 tabs Thanks Dr. Cannon Kettle

## 2018-11-14 NOTE — Telephone Encounter (Signed)
I informed pt of Dr. Stover's review of results and orders. Pt states understanding. 

## 2018-11-14 NOTE — Telephone Encounter (Signed)
Unable to leave a message on mobile voicemail box is not set up yet.

## 2018-11-23 ENCOUNTER — Telehealth: Payer: Self-pay | Admitting: Family Medicine

## 2018-11-23 DIAGNOSIS — R1310 Dysphagia, unspecified: Secondary | ICD-10-CM

## 2018-11-23 DIAGNOSIS — R1319 Other dysphagia: Secondary | ICD-10-CM

## 2018-11-23 DIAGNOSIS — K222 Esophageal obstruction: Secondary | ICD-10-CM

## 2018-11-23 MED ORDER — OMEPRAZOLE 40 MG PO CPDR: DELAYED_RELEASE_CAPSULE | ORAL | 1 refills | Status: DC

## 2018-11-23 NOTE — Telephone Encounter (Signed)
Patient's daughter called and stated that the patient would like to see if you can start prescribing the patient's OMEPRAZOLE 40mg . She stated that his stomach Doctor previously prescribed the medication but has recently retired and the patient would like for you to handle his medication until he gets est with another Stomach doctor      [facility] [address]

## 2018-11-23 NOTE — Telephone Encounter (Signed)
I sent the med but that does not take the place of GI follow-up.  He should still call GI to see about follow-up.  Thanks.

## 2018-11-24 NOTE — Telephone Encounter (Signed)
Patient and daughter Cassandria Santee) advised.

## 2018-11-25 ENCOUNTER — Ambulatory Visit (INDEPENDENT_AMBULATORY_CARE_PROVIDER_SITE_OTHER): Payer: Medicare Other | Admitting: *Deleted

## 2018-11-25 ENCOUNTER — Other Ambulatory Visit: Payer: Self-pay

## 2018-11-25 ENCOUNTER — Ambulatory Visit (INDEPENDENT_AMBULATORY_CARE_PROVIDER_SITE_OTHER): Payer: Medicare Other | Admitting: Podiatry

## 2018-11-25 VITALS — Temp 98.6°F

## 2018-11-25 DIAGNOSIS — L97511 Non-pressure chronic ulcer of other part of right foot limited to breakdown of skin: Secondary | ICD-10-CM | POA: Diagnosis not present

## 2018-11-25 DIAGNOSIS — I4819 Other persistent atrial fibrillation: Secondary | ICD-10-CM | POA: Diagnosis not present

## 2018-11-25 DIAGNOSIS — Z5181 Encounter for therapeutic drug level monitoring: Secondary | ICD-10-CM

## 2018-11-25 LAB — POCT INR: INR: 3.4 — AB (ref 2.0–3.0)

## 2018-11-25 NOTE — Patient Instructions (Signed)
Description    Hold tomorrow,  then continue on same dosage 1 tablet daily except 1.5 tablets each Sundays and Thursdays. Recheck INR in 3 weeks.

## 2018-11-29 ENCOUNTER — Other Ambulatory Visit: Payer: Self-pay

## 2018-11-29 ENCOUNTER — Encounter: Payer: Self-pay | Admitting: Sports Medicine

## 2018-11-29 ENCOUNTER — Ambulatory Visit (INDEPENDENT_AMBULATORY_CARE_PROVIDER_SITE_OTHER): Payer: Medicare Other | Admitting: Sports Medicine

## 2018-11-29 ENCOUNTER — Encounter

## 2018-11-29 DIAGNOSIS — I2510 Atherosclerotic heart disease of native coronary artery without angina pectoris: Secondary | ICD-10-CM | POA: Diagnosis not present

## 2018-11-29 DIAGNOSIS — E1151 Type 2 diabetes mellitus with diabetic peripheral angiopathy without gangrene: Secondary | ICD-10-CM

## 2018-11-29 DIAGNOSIS — S90424D Blister (nonthermal), right lesser toe(s), subsequent encounter: Secondary | ICD-10-CM | POA: Diagnosis not present

## 2018-11-29 NOTE — Progress Notes (Signed)
Subjective: [name] is a 85 y.o. male patient seen in office for evaluation of blister/ulcer to right 2nd toe. Patient is changing the dressing using betadine reports he came last week because the toe was red but now doing good, Patient nausea/fever/vomiting/chills/night sweats/shortness of breath/pain. Patient has no other pedal complaints at this time.  Patient Active Problem List   Diagnosis Date Noted  . Acute non-recurrent maxillary sinusitis 05/25/2018  . Fever 12/05/2017  . Achilles tendinitis 12/05/2017  . Idiopathic chronic gout, unspecified site, without tophus (tophi) 10/06/2017  . Cutaneous abscess of right foot   . Septic arthritis ([place]) 09/09/2017  . URI (upper respiratory infection) 11/24/2016  . Atypical angina ([place]) 11/08/2015  . Acute diastolic heart failure ([place])   . CAD S/P percutaneous coronary angioplasty   . Chronic atrial fibrillation   . CKD (chronic kidney disease), stage III ([place])   . Persistent atrial fibrillation   . Hypertension   . Hyperlipidemia   . Bradycardia   . Back pain 05/24/2015  . Triceps tendonitis 05/24/2015  . Diabetes mellitus without complication ([place]) 40/10/6759  . Dysphagia, pharyngoesophageal phase 09/25/2014  . History of esophageal stricture 09/25/2014  . Chronic anticoagulation 09/25/2014  . Helicobacter pylori duodenitis 09/25/2014  . Cough 03/14/2014  . Benign paroxysmal positional vertigo 03/14/2014  . Encounter for therapeutic drug monitoring 04/12/2013  . Esophageal stricture from GERD 06/08/2011  . Advance care planning 04/20/2011  . OSA on CPAP 09/22/2010  . HYPERCHOLESTEROLEMIA 08/15/2008  . Gout 08/15/2008  . OBESITY 08/15/2008  . Essential hypertension 08/15/2008  . CAD (coronary artery disease), native coronary artery 08/15/2008  . GASTROESOPHAGEAL REFLUX DISEASE 08/15/2008   Current Outpatient Medications on File Prior to Visit  Medication Sig Dispense Refill  . acetaminophen (TYLENOL) 500 MG tablet Take  1,000 mg by mouth as needed for mild pain.    [name] albuterol (PROVENTIL) (2.5 MG/3ML) 0.083% nebulizer solution Take 3 mLs (2.5 mg total) by nebulization every 6 (six) hours as needed (cough or chest congestion). 75 mL 1  . allopurinol (ZYLOPRIM) 100 MG tablet TAKE 1 TABLET BY MOUTH EVERY DAY 90 tablet 3  . colchicine 0.6 MG tablet TAKE 1 TABLET BY MOUTH DAILY IF NEEDED-CAN TAKE 2ND TAB PER DAY IF GOUT FLARE. 60 tablet 1  . diltiazem (CARDIZEM CD) 180 MG 24 hr capsule TAKE 1 CAPSULE (180 MG TOTAL) BY MOUTH DAILY. 90 capsule 2  . furosemide (LASIX) 20 MG tablet TAKE 1 TABLET (20 MG TOTAL) BY MOUTH DAILY. 90 tablet 1  . lisinopril (ZESTRIL) 20 MG tablet TAKE 1 TABLET BY MOUTH DAILY. PLEASE KEEP UPCOMING APPT IN MARCH WITH DR. Burt Knack FOR FUTURE REFILLS 90 tablet 3  . metoprolol tartrate (LOPRESSOR) 25 MG tablet Take 0.5 tablets (12.5 mg total) by mouth 2 (two) times daily. 30 tablet 3  . mupirocin ointment (BACTROBAN) 2 %     . nitroGLYCERIN (NITROSTAT) 0.4 MG SL tablet Place 1 tablet (0.4 mg total) under the tongue every 5 (five) minutes x 3 doses as needed for chest pain. 25 tablet 2  . omeprazole (PRILOSEC) 40 MG capsule TAKE 1 CAPSULE BY MOUTH EVERY DAY 90 capsule 1  . sulfamethoxazole-trimethoprim (BACTRIM) 400-80 MG tablet Take 1 tablet by mouth 2 (two) times daily. 28 tablet 0  . warfarin (COUMADIN) 4 MG tablet TAKE AS DIRECTED BY COUMADIN CLINIC 120 tablet 1   No current facility-administered medications on file prior to visit.    Allergies  Allergen Reactions  . Penicillins Shortness Of Breath  and Swelling    Patient tolerated Rocephin injection 2019  PATIENT HAS HAD A PCN REACTION WITH IMMEDIATE RASH, FACIAL/TONGUE/THROAT SWELLING, SOB, OR LIGHTHEADEDNESS WITH HYPOTENSION:  #  #  YES  #  Has patient had a PCN reaction causing severe rash involving mucus membranes or skin necrosis: NO Has patient had a PCN reaction that required hospitalization NO Has patient had a PCN reaction occurring  within the last 10 years: NO If all of the above answers are "NO", then may proceed with Cephalosporin use.   . Atorvastatin Other (See Comments)    No energy, just felt bad    . Pravastatin Other (See Comments)    myalgias  . Hydrocodone Other (See Comments)    "swimmy headed" after use  . Uloric [Febuxostat] Other (See Comments)    Would avoid, worsening gout sx with use.   . Vancomycin Other (See Comments)    Red man syndrome- tolerated with slow run and benadryl use  . Biaxin [Clarithromycin] Diarrhea  . Doxycycline Other (See Comments)    GI upset    Recent Results (from the past 2160 hour(s))  POCT INR     Status: Abnormal   Collection Time: 10/21/18  8:37 AM  Result Value Ref Range   INR 3.3 (A) 2.0 - 3.0  WOUND CULTURE     Status: None   Collection Time: 11/08/18  9:36 AM   Specimen: Foot, Right; Wound  Result Value Ref Range   MICRO NUMBER: [id number]    SPECIMEN QUALITY: Adequate    SOURCE: RIGHT GREAT TOE    STATUS: FINAL    GRAM STAIN:      No white blood cells seen No epithelial cells seen Few Gram positive cocci in pairs   ISOLATE 1: Staphylococcus lugdunensis     Comment: Moderate growth of Staphylococcus lugdunensis      Susceptibility   Staphylococcus lugdunensis - AEROBIC CULT, GRAM STAIN POSITIVE 1    VANCOMYCIN <=0.5 Sensitive     CIPROFLOXACIN <=0.5 Sensitive     CLINDAMYCIN >=8 Resistant     LEVOFLOXACIN 0.25 Sensitive     ERYTHROMYCIN >=8 Resistant     GENTAMICIN <=0.5 Sensitive     OXACILLIN* 0.5 Sensitive      * Oxacillin-susceptible staphylococci aresusceptible to other penicillinase-stablepenicillins (e.g. Methicillin, Nafcillin), beta-lactam/beta-lactamase inhibitor combinations, andcephems with staphylococcal indications, includingCefazolin.    TETRACYCLINE <=1 Sensitive     TRIMETH/SULFA* 20 Sensitive      * Oxacillin-susceptible staphylococci aresusceptible to other penicillinase-stablepenicillins (e.g. Methicillin, Nafcillin),  beta-lactam/beta-lactamase inhibitor combinations, andcephems with staphylococcal indications, includingCefazolin.Legend:S = Susceptible  I = IntermediateR = Resistant  NS = Not susceptible* = Not tested  NR = Not reported**NN = See antimicrobic comments  POCT INR     Status: Abnormal   Collection Time: 11/25/18  8:46 AM  Result Value Ref Range   INR 3.4 (A) 2.0 - 3.0    Objective: There were no vitals filed for this visit.  General: Patient is awake, alert, oriented x 3 and in no acute distress.  Dermatology: Skin is warm and dry bilateral with a ruptured blister at medial right 2nd toe with granular base measures less than 0.2cm improving in nature. The ulceration does not probe to bone. There is no malodor, no active drainage, no erythema, no edema. No acute signs of infection.   Vascular: Dorsalis Pedis pulse = 1/4 Bilateral,  Posterior Tibial pulse = 0/4 Bilateral,  Capillary Fill Time < 5 seconds  Neurologic: Protective sensation diminished bilateral  Musculosketal: There is bunion and hammertoe deformity. No Pain with palpation to ulcerated area. No pain with compression to calves bilateral.  No results for input(s): GRAMSTAIN, LABORGA in the last 8760 hours.  Assessment and Plan:  Problem List Items Addressed This Visit    None    Visit Diagnoses    Blister of toe of right foot, subsequent encounter    -  Primary   Diabetes mellitus type 2 with peripheral artery disease ([place])           -Examined patient and discussed the progression of the wound and treatment alternatives. -Cleansed now ulcerated blister and removed loose skin using sterile tissue nipper -Applied betadine and dry sterile dressing and instructed patient to continue with daily dressings at home consisting of same and may leave open to air at night -Finish bactrim -Dispensed toe spacer -Continue with post op shoe - Advised patient to go to the ER or return to office if the wound worsens or if  constitutional symptoms are present. -Patient to return to office in 2-3 weeks for follow up care and evaluation or sooner if problems arise. Mable Fill, DPM

## 2018-12-13 ENCOUNTER — Ambulatory Visit: Payer: Medicare Other | Admitting: Sports Medicine

## 2018-12-14 NOTE — Progress Notes (Signed)
Subjective:  Patient ID: [name], male    DOB: May 31, 1930,  MRN: [id number]  Chief Complaint  Patient presents with  . Wound Check    Pt states right 2nd digit ulcer, medial aspect, denies drainage, denies fever/nausea/vomiting/chills. Pt is taking sulfa antibiotics. Pt states his daughter was concerned about the skin around the wound, wanted to have it examined.    85 y.o. male presents for wound care.  History above confirmed with patient   Review of Systems: Negative except as noted in the HPI. Denies N/V/F/Ch.  Past Medical History:  Diagnosis Date  . Bradycardia    a. nocturnal with pauses overnight on tele likely due to OSA 10/2015.  [name] CKD (chronic kidney disease), stage III ([place])   . Coronary artery disease    a. prev nonobst. b. LHC 10/24/15: occlusion of small diffusely diseased OM2 s/p balloon angioplasty, mild nonobstructive disease of mLAD and mRCA, normal LVEDP  . Diabetes mellitus without complication ([place])   . Diverticulosis   . Esophageal reflux   . Esophageal stricture from GERD 06/08/2011   With stricture at GE junction on EGD, dilated 06/2011   . Gastric ulcer    on EGD 2013  . Gout, unspecified    "on daily RX" (09/09/2017)  . History of blood transfusion 02/1964   "when I had right little finger cut off"  . Hyperlipidemia   . Hypertension   . Obesity, unspecified   . Orthopnea   . OSA on CPAP   . Paroxysmal nocturnal dyspnea   . Persistent atrial fibrillation   . Vertigo     Current Outpatient Medications:  .  acetaminophen (TYLENOL) 500 MG tablet, Take 1,000 mg by mouth as needed for mild pain., Disp: , Rfl:  .  albuterol (PROVENTIL) (2.5 MG/3ML) 0.083% nebulizer solution, Take 3 mLs (2.5 mg total) by nebulization every 6 (six) hours as needed (cough or chest congestion)., Disp: 75 mL, Rfl: 1 .  allopurinol (ZYLOPRIM) 100 MG tablet, TAKE 1 TABLET BY MOUTH EVERY DAY, Disp: 90 tablet, Rfl: 3 .  colchicine 0.6 MG tablet, TAKE 1 TABLET BY MOUTH  DAILY IF NEEDED-CAN TAKE 2ND TAB PER DAY IF GOUT FLARE., Disp: 60 tablet, Rfl: 1 .  diltiazem (CARDIZEM CD) 180 MG 24 hr capsule, TAKE 1 CAPSULE (180 MG TOTAL) BY MOUTH DAILY., Disp: 90 capsule, Rfl: 2 .  furosemide (LASIX) 20 MG tablet, TAKE 1 TABLET (20 MG TOTAL) BY MOUTH DAILY., Disp: 90 tablet, Rfl: 1 .  lisinopril (ZESTRIL) 20 MG tablet, TAKE 1 TABLET BY MOUTH DAILY. PLEASE KEEP UPCOMING APPT IN MARCH WITH DR. Burt Knack FOR FUTURE REFILLS, Disp: 90 tablet, Rfl: 3 .  metoprolol tartrate (LOPRESSOR) 25 MG tablet, Take 0.5 tablets (12.5 mg total) by mouth 2 (two) times daily., Disp: 30 tablet, Rfl: 3 .  mupirocin ointment (BACTROBAN) 2 %, , Disp: , Rfl:  .  nitroGLYCERIN (NITROSTAT) 0.4 MG SL tablet, Place 1 tablet (0.4 mg total) under the tongue every 5 (five) minutes x 3 doses as needed for chest pain., Disp: 25 tablet, Rfl: 2 .  omeprazole (PRILOSEC) 40 MG capsule, TAKE 1 CAPSULE BY MOUTH EVERY DAY, Disp: 90 capsule, Rfl: 1 .  sulfamethoxazole-trimethoprim (BACTRIM) 400-80 MG tablet, Take 1 tablet by mouth 2 (two) times daily., Disp: 28 tablet, Rfl: 0 .  warfarin (COUMADIN) 4 MG tablet, TAKE AS DIRECTED BY COUMADIN CLINIC, Disp: 120 tablet, Rfl: 1  Social History   Tobacco Use  Smoking Status Former Smoker  . Packs/day:  0.12  . Years: 4.00  . Pack years: 0.48  . Types: Cigarettes  . Quit date: 03/16/1969  . Years since quitting: 49.7  Smokeless Tobacco Former Systems developer  Tobacco Comment   "chewed when I smoked; smoked 1 pack/week for about 4 years, quit 1971    Allergies  Allergen Reactions  . Penicillins Shortness Of Breath and Swelling    Patient tolerated Rocephin injection 2019  PATIENT HAS HAD A PCN REACTION WITH IMMEDIATE RASH, FACIAL/TONGUE/THROAT SWELLING, SOB, OR LIGHTHEADEDNESS WITH HYPOTENSION:  #  #  YES  #  Has patient had a PCN reaction causing severe rash involving mucus membranes or skin necrosis: NO Has patient had a PCN reaction that required hospitalization NO Has  patient had a PCN reaction occurring within the last 10 years: NO If all of the above answers are "NO", then may proceed with Cephalosporin use.   . Atorvastatin Other (See Comments)    No energy, just felt bad    . Pravastatin Other (See Comments)    myalgias  . Hydrocodone Other (See Comments)    "swimmy headed" after use  . Uloric [Febuxostat] Other (See Comments)    Would avoid, worsening gout sx with use.   . Vancomycin Other (See Comments)    Red man syndrome- tolerated with slow run and benadryl use  . Biaxin [Clarithromycin] Diarrhea  . Doxycycline Other (See Comments)    GI upset   Objective:   Vitals:   11/25/18 1037  Temp: 98.6 F (37 C)   There is no height or weight on file to calculate BMI. Constitutional Well developed. Well nourished.  Vascular Dorsalis pedis pulses palpable bilaterally. Posterior tibial pulses palpable bilaterally. Capillary refill normal to all digits.  No cyanosis or clubbing noted. Pedal hair growth normal.  Neurologic Normal speech. Oriented to person, place, and time. Protective sensation absent  Dermatologic  ulcer right second toe with slight blister formation  Orthopedic: No pain to palpation either foot.   Radiographs: None Assessment:   1. Ulcer of toe, right, limited to breakdown of skin [facility])    Plan:  Patient was evaluated and treated and all questions answered.  Ulcer right second toe -Improving -Continue antibiotics continue daily dressing -Periwound skin gently debrided -Follow-up with Dr. Cannon Kettle next week  No follow-ups on file.

## 2018-12-16 ENCOUNTER — Other Ambulatory Visit: Payer: Self-pay

## 2018-12-16 ENCOUNTER — Ambulatory Visit (INDEPENDENT_AMBULATORY_CARE_PROVIDER_SITE_OTHER): Payer: Medicare Other | Admitting: *Deleted

## 2018-12-16 DIAGNOSIS — Z5181 Encounter for therapeutic drug level monitoring: Secondary | ICD-10-CM

## 2018-12-16 DIAGNOSIS — I4819 Other persistent atrial fibrillation: Secondary | ICD-10-CM | POA: Diagnosis not present

## 2018-12-16 LAB — POCT INR: INR: 2.7 (ref 2.0–3.0)

## 2018-12-16 NOTE — Patient Instructions (Addendum)
Description     Continue on same dosage 1 tablet daily except 1.5 tablets each Sundays and Thursdays. Recheck INR in 4 weeks. Coumadin Clinic [phone]

## 2018-12-20 DIAGNOSIS — Z23 Encounter for immunization: Secondary | ICD-10-CM | POA: Diagnosis not present

## 2018-12-23 ENCOUNTER — Other Ambulatory Visit: Payer: Self-pay

## 2018-12-23 ENCOUNTER — Ambulatory Visit (INDEPENDENT_AMBULATORY_CARE_PROVIDER_SITE_OTHER): Payer: Medicare Other | Admitting: Family Medicine

## 2018-12-23 ENCOUNTER — Encounter: Payer: Self-pay | Admitting: Family Medicine

## 2018-12-23 VITALS — BP 138/78 | HR 89 | Temp 97.8°F | Ht 64.5 in | Wt 218.5 lb

## 2018-12-23 DIAGNOSIS — R059 Cough, unspecified: Secondary | ICD-10-CM

## 2018-12-23 DIAGNOSIS — R05 Cough: Secondary | ICD-10-CM

## 2018-12-23 DIAGNOSIS — I2510 Atherosclerotic heart disease of native coronary artery without angina pectoris: Secondary | ICD-10-CM | POA: Diagnosis not present

## 2018-12-23 DIAGNOSIS — E119 Type 2 diabetes mellitus without complications: Secondary | ICD-10-CM | POA: Diagnosis not present

## 2018-12-23 DIAGNOSIS — M109 Gout, unspecified: Secondary | ICD-10-CM | POA: Diagnosis not present

## 2018-12-23 LAB — POCT GLYCOSYLATED HEMOGLOBIN (HGB A1C): Hemoglobin A1C: 6.7 % — AB (ref 4.0–5.6)

## 2018-12-23 NOTE — Patient Instructions (Signed)
Call about follow up at the eye clinic when you can.  I'll await the foot clinic notes.  Take care.  Glad to see you.  Don't change your meds for now.

## 2018-12-23 NOTE — Progress Notes (Signed)
Diabetes:  No meds.  Hypoglycemic episodes: no Hyperglycemic episodes:no Feet problems: at baseline, with f/u pending.  Prev site healed per patient report.  Blood Sugars averaging: 100-125 usually on home check  eye exam within last year: due, d/w pt.   A1c done at [facility].  D/w pt at [facility].    Gout. No ADE on med.  No flares.   Compliant with allopurinol.   No recent SABA use.  He has some coughing after cutting grass but not o/w.   Meds, vitals, and allergies reviewed.   ROS: Per HPI unless specifically indicated in ROS section   GEN: nad, alert and oriented HEENT: ncat NECK: supple w/o LA CV: IRR but not tachy PULM: ctab, no inc wob ABD: soft, +bs EXT: no edema SKIN: no acute rash  Diabetic foot exam: Normal inspection except for healed lesion on the R medial 2nd toe.  No skin breakdown Callus noted L foot.  Normal DP pulses Normal sensation to light touch and monofilament Nails normal

## 2018-12-25 NOTE — Assessment & Plan Note (Signed)
No ADE on med.  No flares.   Compliant with allopurinol.  Continue as is.

## 2018-12-25 NOTE — Assessment & Plan Note (Signed)
A1c controlled.  Discussed with patient at office visit.  Discussed diet.  He has follow-up with the foot clinic pending.  Routine foot care discussed with patient.  He agrees.

## 2018-12-25 NOTE — Assessment & Plan Note (Signed)
Only noted after cutting grass outside.  No recent albuterol use.  He will update me as needed.  Lungs are clear.

## 2018-12-29 ENCOUNTER — Ambulatory Visit: Payer: Medicare Other | Admitting: Podiatry

## 2019-01-03 ENCOUNTER — Ambulatory Visit: Payer: Medicare Other | Admitting: Sports Medicine

## 2019-01-13 ENCOUNTER — Ambulatory Visit (INDEPENDENT_AMBULATORY_CARE_PROVIDER_SITE_OTHER): Payer: Medicare Other | Admitting: *Deleted

## 2019-01-13 ENCOUNTER — Other Ambulatory Visit: Payer: Self-pay

## 2019-01-13 DIAGNOSIS — Z5181 Encounter for therapeutic drug level monitoring: Secondary | ICD-10-CM

## 2019-01-13 DIAGNOSIS — I4819 Other persistent atrial fibrillation: Secondary | ICD-10-CM | POA: Diagnosis not present

## 2019-01-13 LAB — POCT INR: INR: 2.5 (ref 2.0–3.0)

## 2019-01-13 NOTE — Patient Instructions (Signed)
Description   Continue on same dosage 1 tablet daily except 1.5 tablets each Sundays and Thursdays. Recheck INR in 5 weeks. Coumadin Clinic [phone]

## 2019-01-17 ENCOUNTER — Ambulatory Visit (INDEPENDENT_AMBULATORY_CARE_PROVIDER_SITE_OTHER): Payer: Medicare Other | Admitting: Sports Medicine

## 2019-01-17 ENCOUNTER — Encounter: Payer: Self-pay | Admitting: Sports Medicine

## 2019-01-17 ENCOUNTER — Other Ambulatory Visit: Payer: Self-pay

## 2019-01-17 DIAGNOSIS — M79675 Pain in left toe(s): Secondary | ICD-10-CM | POA: Diagnosis not present

## 2019-01-17 DIAGNOSIS — B351 Tinea unguium: Secondary | ICD-10-CM

## 2019-01-17 DIAGNOSIS — M79674 Pain in right toe(s): Secondary | ICD-10-CM | POA: Diagnosis not present

## 2019-01-17 DIAGNOSIS — M79676 Pain in unspecified toe(s): Secondary | ICD-10-CM

## 2019-01-17 DIAGNOSIS — E1151 Type 2 diabetes mellitus with diabetic peripheral angiopathy without gangrene: Secondary | ICD-10-CM | POA: Diagnosis not present

## 2019-01-17 DIAGNOSIS — S90424D Blister (nonthermal), right lesser toe(s), subsequent encounter: Secondary | ICD-10-CM

## 2019-01-17 NOTE — Progress Notes (Signed)
Subjective: [name] is a 85 y.o. male patient seen in office for evaluation of blister/ulcer to right 2nd toe. Patient is reporting that the right second toe is doing well no longer applying Betadine and has had no issues except try to keep the toes pressure in between the toes, patient also requests a nail trim. Patient nausea/fever/vomiting/chills/night sweats/shortness of breath/pain. Patient has no other pedal complaints at this time.  Fasting blood sugar not recorded.  Patient Active Problem List   Diagnosis Date Noted  . Acute non-recurrent maxillary sinusitis 05/25/2018  . Achilles tendinitis 12/05/2017  . Idiopathic chronic gout, unspecified site, without tophus (tophi) 10/06/2017  . Cutaneous abscess of right foot   . Atypical angina ([place]) 11/08/2015  . Acute diastolic heart failure ([place])   . CAD S/P percutaneous coronary angioplasty   . Chronic atrial fibrillation ([place])   . CKD (chronic kidney disease), stage III   . Persistent atrial fibrillation ([place])   . Hypertension   . Hyperlipidemia   . Bradycardia   . Back pain 05/24/2015  . Triceps tendonitis 05/24/2015  . Diabetes mellitus without complication ([place]) 40/12/2723  . Dysphagia, pharyngoesophageal phase 09/25/2014  . History of esophageal stricture 09/25/2014  . Chronic anticoagulation 09/25/2014  . Helicobacter pylori duodenitis 09/25/2014  . Cough 03/14/2014  . Benign paroxysmal positional vertigo 03/14/2014  . Encounter for therapeutic drug monitoring 04/12/2013  . Esophageal stricture from GERD 06/08/2011  . Advance care planning 04/20/2011  . OSA on CPAP 09/22/2010  . HYPERCHOLESTEROLEMIA 08/15/2008  . Gout 08/15/2008  . OBESITY 08/15/2008  . Essential hypertension 08/15/2008  . CAD (coronary artery disease), native coronary artery 08/15/2008  . GASTROESOPHAGEAL REFLUX DISEASE 08/15/2008   Current Outpatient Medications on File Prior to Visit  Medication Sig Dispense Refill  . acetaminophen  (TYLENOL) 500 MG tablet Take 1,000 mg by mouth as needed for mild pain.    [name] allopurinol (ZYLOPRIM) 100 MG tablet TAKE 1 TABLET BY MOUTH EVERY DAY 90 tablet 3  . colchicine 0.6 MG tablet TAKE 1 TABLET BY MOUTH DAILY IF NEEDED-CAN TAKE 2ND TAB PER DAY IF GOUT FLARE. 60 tablet 1  . diltiazem (CARDIZEM CD) 180 MG 24 hr capsule TAKE 1 CAPSULE (180 MG TOTAL) BY MOUTH DAILY. 90 capsule 2  . furosemide (LASIX) 20 MG tablet TAKE 1 TABLET (20 MG TOTAL) BY MOUTH DAILY. 90 tablet 1  . lisinopril (ZESTRIL) 20 MG tablet TAKE 1 TABLET BY MOUTH DAILY. PLEASE KEEP UPCOMING APPT IN MARCH WITH DR. Burt Knack FOR FUTURE REFILLS 90 tablet 3  . metoprolol tartrate (LOPRESSOR) 25 MG tablet Take 0.5 tablets (12.5 mg total) by mouth 2 (two) times daily. 30 tablet 3  . mupirocin ointment (BACTROBAN) 2 %     . nitroGLYCERIN (NITROSTAT) 0.4 MG SL tablet Place 1 tablet (0.4 mg total) under the tongue every 5 (five) minutes x 3 doses as needed for chest pain. 25 tablet 2  . omeprazole (PRILOSEC) 40 MG capsule TAKE 1 CAPSULE BY MOUTH EVERY DAY 90 capsule 1  . warfarin (COUMADIN) 4 MG tablet TAKE AS DIRECTED BY COUMADIN CLINIC 120 tablet 1   No current facility-administered medications on file prior to visit.    Allergies  Allergen Reactions  . Penicillins Shortness Of Breath and Swelling    Patient tolerated Rocephin injection 2019  PATIENT HAS HAD A PCN REACTION WITH IMMEDIATE RASH, FACIAL/TONGUE/THROAT SWELLING, SOB, OR LIGHTHEADEDNESS WITH HYPOTENSION:  #  #  YES  #  Has patient had a PCN reaction causing  severe rash involving mucus membranes or skin necrosis: NO Has patient had a PCN reaction that required hospitalization NO Has patient had a PCN reaction occurring within the last 10 years: NO If all of the above answers are "NO", then may proceed with Cephalosporin use.   . Atorvastatin Other (See Comments)    No energy, just felt bad    . Pravastatin Other (See Comments)    myalgias  . Hydrocodone Other (See  Comments)    "swimmy headed" after use  . Uloric [Febuxostat] Other (See Comments)    Would avoid, worsening gout sx with use.   . Vancomycin Other (See Comments)    Red man syndrome- tolerated with slow run and benadryl use  . Biaxin [Clarithromycin] Diarrhea  . Doxycycline Other (See Comments)    GI upset    Recent Results (from the past 2160 hour(s))  POCT INR     Status: Abnormal   Collection Time: 10/21/18  8:37 AM  Result Value Ref Range   INR 3.3 (A) 2.0 - 3.0  WOUND CULTURE     Status: None   Collection Time: 11/08/18  9:36 AM   Specimen: Foot, Right; Wound  Result Value Ref Range   MICRO NUMBER: [id number]    SPECIMEN QUALITY: Adequate    SOURCE: RIGHT GREAT TOE    STATUS: FINAL    GRAM STAIN:      No white blood cells seen No epithelial cells seen Few Gram positive cocci in pairs   ISOLATE 1: Staphylococcus lugdunensis     Comment: Moderate growth of Staphylococcus lugdunensis      Susceptibility   Staphylococcus lugdunensis - AEROBIC CULT, GRAM STAIN POSITIVE 1    VANCOMYCIN <=0.5 Sensitive     CIPROFLOXACIN <=0.5 Sensitive     CLINDAMYCIN >=8 Resistant     LEVOFLOXACIN 0.25 Sensitive     ERYTHROMYCIN >=8 Resistant     GENTAMICIN <=0.5 Sensitive     OXACILLIN* 0.5 Sensitive      * Oxacillin-susceptible staphylococci aresusceptible to other penicillinase-stablepenicillins (e.g. Methicillin, Nafcillin), beta-lactam/beta-lactamase inhibitor combinations, andcephems with staphylococcal indications, includingCefazolin.    TETRACYCLINE <=1 Sensitive     TRIMETH/SULFA* 20 Sensitive      * Oxacillin-susceptible staphylococci aresusceptible to other penicillinase-stablepenicillins (e.g. Methicillin, Nafcillin), beta-lactam/beta-lactamase inhibitor combinations, andcephems with staphylococcal indications, includingCefazolin.Legend:S = Susceptible  I = IntermediateR = Resistant  NS = Not susceptible* = Not tested  NR = Not reported**NN = See antimicrobic comments  POCT INR      Status: Abnormal   Collection Time: 11/25/18  8:46 AM  Result Value Ref Range   INR 3.4 (A) 2.0 - 3.0  POCT INR     Status: None   Collection Time: 12/16/18  9:20 AM  Result Value Ref Range   INR 2.7 2.0 - 3.0  POCT glycosylated hemoglobin (Hb A1C)     Status: Abnormal   Collection Time: 12/23/18  2:23 PM  Result Value Ref Range   Hemoglobin A1C 6.7 (A) 4.0 - 5.6 %   HbA1c POC (<> result, manual entry)     HbA1c, POC (prediabetic range)     HbA1c, POC (controlled diabetic range)    POCT INR     Status: None   Collection Time: 01/13/19  8:41 AM  Result Value Ref Range   INR 2.5 2.0 - 3.0    Objective: There were no vitals filed for this visit.  General: Patient is awake, alert, oriented x 3 and in no acute distress.  Dermatology:  Skin is warm and dry bilateral with a healed blister at right second toe, no active drainage, no erythema, no edema. No acute signs of infection.  Nails are mildly elongated and thickened consistent with onychomycosis x10.   Vascular: Dorsalis Pedis pulse = 1/4 Bilateral,  Posterior Tibial pulse = 0/4 Bilateral,  Capillary Fill Time < 5 seconds  Neurologic: Protective sensation diminished bilateral  Musculosketal: There is bunion and hammertoe deformity. No Pain with palpation to healed blistered area. No pain with compression to calves bilateral.  No results for input(s): GRAMSTAIN, LABORGA in the last 8760 hours.  Assessment and Plan:  Problem List Items Addressed This Visit    None    Visit Diagnoses    Blister of toe of right foot, subsequent encounter    -  Primary   Healed   Diabetes mellitus type 2 with peripheral artery disease (HCC)       Pain due to onychomycosis of toenails of both feet           -Examined patient  -Mechanically debrided all nails x10 using a sterile nail nipper without incident -Discussed the progression of the wound/blister and treatment alternatives. -Right second toe blister well-healed no longer needs to  apply Betadine to the area -Dispensed a new toe spacer and advised good supportive shoes that do not rub at right second toe - Advised patient to go to the ER or return to office if the wound worsens or if constitutional symptoms are present. -Patient to return to office in 10 to 12 weeks for routine foot care.  Landis Martins, DPM

## 2019-02-08 ENCOUNTER — Telehealth: Payer: Self-pay

## 2019-02-08 ENCOUNTER — Ambulatory Visit (INDEPENDENT_AMBULATORY_CARE_PROVIDER_SITE_OTHER): Payer: Medicare Other

## 2019-02-08 ENCOUNTER — Ambulatory Visit (INDEPENDENT_AMBULATORY_CARE_PROVIDER_SITE_OTHER): Payer: Medicare Other | Admitting: Podiatry

## 2019-02-08 ENCOUNTER — Other Ambulatory Visit: Payer: Self-pay

## 2019-02-08 DIAGNOSIS — S90424D Blister (nonthermal), right lesser toe(s), subsequent encounter: Secondary | ICD-10-CM | POA: Diagnosis not present

## 2019-02-08 DIAGNOSIS — E1151 Type 2 diabetes mellitus with diabetic peripheral angiopathy without gangrene: Secondary | ICD-10-CM | POA: Diagnosis not present

## 2019-02-08 DIAGNOSIS — L97511 Non-pressure chronic ulcer of other part of right foot limited to breakdown of skin: Secondary | ICD-10-CM | POA: Diagnosis not present

## 2019-02-08 MED ORDER — SULFAMETHOXAZOLE-TRIMETHOPRIM 800-160 MG PO TABS: 1.0000 | ORAL_TABLET | Freq: Two times a day (BID) | ORAL | 0 refills | Status: DC

## 2019-02-08 NOTE — Telephone Encounter (Signed)
Patient's daughter contacted the office and just wanted to inform Dr. Damita Dunnings that patient was seen by foot doctor today, and they are treating him for an infection that may be to the bone - on his 2nd toe on his right foot.  Patient's daughter states that have scheduled an appt with an Ortho surgeon - Dr. Sharol Given, on 11/30. Patient's daughter states that if you have any questions, you can call [phone].

## 2019-02-11 ENCOUNTER — Encounter: Payer: Self-pay | Admitting: Podiatry

## 2019-02-11 NOTE — Progress Notes (Signed)
Subjective:  Patient ID: [name], male    DOB: Nov 21, 1930,  MRN: [id number]  Chief Complaint  Patient presents with  . Foot Ulcer    pt is here for a f/u on a diabetic ulcer, pt states that it was red last night, pt states that his home aide, applied some betadine to the wound,    85 y.o. male presents for wound care.  Patient presents for follow-up from a right second digit ulceration with a concern for infection.  Patient states that his daughter had noticed some drainage and wanted him to come in to take a look at her right away.  It has looked a little worse over the last couple of days.  Patient has been applying Betadine to it starting yesterday.  Patient also has a hearing impairment.  Patient denies any other acute treatment.  Patient denies taking any antibiotics.  Patient is well-known to Dr. Cannon Kettle who has been managing this morning.   Review of Systems: Negative except as noted in the HPI. Denies N/V/F/Ch.  Past Medical History:  Diagnosis Date  . Bradycardia    a. nocturnal with pauses overnight on tele likely due to OSA 10/2015.  [name] CKD (chronic kidney disease), stage III   . Coronary artery disease    a. prev nonobst. b. LHC 10/24/15: occlusion of small diffusely diseased OM2 s/p balloon angioplasty, mild nonobstructive disease of mLAD and mRCA, normal LVEDP  . Diabetes mellitus without complication ([place])   . Diverticulosis   . Esophageal reflux   . Esophageal stricture from GERD 06/08/2011   With stricture at GE junction on EGD, dilated 06/2011   . Gastric ulcer    on EGD 2013  . Gout, unspecified    "on daily RX" (09/09/2017)  . History of blood transfusion 02/1964   "when I had right little finger cut off"  . Hyperlipidemia   . Hypertension   . Obesity, unspecified   . Orthopnea   . OSA on CPAP   . Paroxysmal nocturnal dyspnea   . Persistent atrial fibrillation ([place])   . Vertigo     Current Outpatient Medications:  .  acetaminophen (TYLENOL) 500 MG  tablet, Take 1,000 mg by mouth as needed for mild pain., Disp: , Rfl:  .  allopurinol (ZYLOPRIM) 100 MG tablet, TAKE 1 TABLET BY MOUTH EVERY DAY, Disp: 90 tablet, Rfl: 3 .  colchicine 0.6 MG tablet, TAKE 1 TABLET BY MOUTH DAILY IF NEEDED-CAN TAKE 2ND TAB PER DAY IF GOUT FLARE., Disp: 60 tablet, Rfl: 1 .  diltiazem (CARDIZEM CD) 180 MG 24 hr capsule, TAKE 1 CAPSULE (180 MG TOTAL) BY MOUTH DAILY., Disp: 90 capsule, Rfl: 2 .  furosemide (LASIX) 20 MG tablet, TAKE 1 TABLET (20 MG TOTAL) BY MOUTH DAILY., Disp: 90 tablet, Rfl: 1 .  lisinopril (ZESTRIL) 20 MG tablet, TAKE 1 TABLET BY MOUTH DAILY. PLEASE KEEP UPCOMING APPT IN MARCH WITH DR. Burt Knack FOR FUTURE REFILLS, Disp: 90 tablet, Rfl: 3 .  metoprolol tartrate (LOPRESSOR) 25 MG tablet, Take 0.5 tablets (12.5 mg total) by mouth 2 (two) times daily., Disp: 30 tablet, Rfl: 3 .  mupirocin ointment (BACTROBAN) 2 %, , Disp: , Rfl:  .  nitroGLYCERIN (NITROSTAT) 0.4 MG SL tablet, Place 1 tablet (0.4 mg total) under the tongue every 5 (five) minutes x 3 doses as needed for chest pain., Disp: 25 tablet, Rfl: 2 .  omeprazole (PRILOSEC) 40 MG capsule, TAKE 1 CAPSULE BY MOUTH EVERY DAY, Disp: 90 capsule, Rfl: 1 .  warfarin (COUMADIN) 4 MG tablet, TAKE AS DIRECTED BY COUMADIN CLINIC, Disp: 120 tablet, Rfl: 1 .  sulfamethoxazole-trimethoprim (BACTRIM DS) 800-160 MG tablet, Take 1 tablet by mouth 2 (two) times daily., Disp: 20 tablet, Rfl: 0  Social History   Tobacco Use  Smoking Status Former Smoker  . Packs/day: 0.12  . Years: 4.00  . Pack years: 0.48  . Types: Cigarettes  . Quit date: 03/16/1969  . Years since quitting: 49.9  Smokeless Tobacco Former Systems developer  Tobacco Comment   "chewed when I smoked; smoked 1 pack/week for about 4 years, quit 1971    Allergies  Allergen Reactions  . Penicillins Shortness Of Breath and Swelling    Patient tolerated Rocephin injection 2019  PATIENT HAS HAD A PCN REACTION WITH IMMEDIATE RASH, FACIAL/TONGUE/THROAT SWELLING,  SOB, OR LIGHTHEADEDNESS WITH HYPOTENSION:  #  #  YES  #  Has patient had a PCN reaction causing severe rash involving mucus membranes or skin necrosis: NO Has patient had a PCN reaction that required hospitalization NO Has patient had a PCN reaction occurring within the last 10 years: NO If all of the above answers are "NO", then may proceed with Cephalosporin use.   . Atorvastatin Other (See Comments)    No energy, just felt bad    . Pravastatin Other (See Comments)    myalgias  . Hydrocodone Other (See Comments)    "swimmy headed" after use  . Uloric [Febuxostat] Other (See Comments)    Would avoid, worsening gout sx with use.   . Vancomycin Other (See Comments)    Red man syndrome- tolerated with slow run and benadryl use  . Biaxin [Clarithromycin] Diarrhea  . Doxycycline Other (See Comments)    GI upset   Objective:  There were no vitals filed for this visit. There is no height or weight on file to calculate BMI. Constitutional Well developed. Well nourished.  Vascular Dorsalis pedis pulses palpable bilaterally. Posterior tibial pulses palpable bilaterally. Capillary refill normal to all digits.  No cyanosis or clubbing noted. Pedal hair growth normal.  Neurologic Normal speech. Oriented to person, place, and time. Protective sensation absent  Dermatologic Wound Location: Right second toe medial aspect hyperkeratotic lesion Wound Base: Mixed Granular/Fibrotic Peri-wound: Calloused Exudate: Moderate amount Serous exudate The wound probes down to deep tissue. Wound Measurements: -See below  Orthopedic: No pain to palpation either foot.   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  No soft tissue emphysema noted. Assessment:   1. Blister of toe of right foot, subsequent encounter    Plan:  Patient was evaluated and treated and all questions answered.  Ulcer  right second digit ulceration -Debridement as below. -Dressed with Betadine wet-to-dry, DSD. -Continue off-loading with surgical shoe.  -Given the radiographical changes noted on the x-ray with acute osteomyelitis I believe patient will benefit likely from an amputation if infection worsens.  At this time it is stable with mild erythema without any purulent drainage or soft tissue emphysema noted. -I myself or Dr. Cannon Kettle will evaluate the patient next week and make the determination of amputation versus salvage of the toe.  I instructed him to go to the emergency department if the toe worsens.  I educated him on clinical signs of infection if any infection worsens or if there is redness going up the leg instructed him to go to the emergency room right away. -I have started him on prophylaxis  for antibiotics.  Procedure: Excisional Debridement of Wound Rationale: Removal of non-viable soft tissue from the wound to promote healing.  Anesthesia: none Pre-Debridement Wound Measurements: 0.5 cm x 0.5 cm x 0.3 cm  Post-Debridement Wound Measurements: 0.4 cm x 0.4 cm x 0.3 cm  Type of Debridement: Sharp Excisional Tissue Removed: Non-viable soft tissue Depth of Debridement: subcutaneous tissue. Technique: Sharp excisional debridement to bleeding, viable wound base.  Dressing: Dry, sterile, compression dressing. Disposition: Patient tolerated procedure well. Patient to return in 1 week for follow-up.  Return in 5 days (on 02/13/2019) for follow up on Monday with new xrays.

## 2019-02-12 NOTE — Telephone Encounter (Signed)
Thanks- I hope he improves soon.  I'll await the note from Dr. Sharol Given.  If he is going to have a procedure done, please make sure he contacts the anticoagulation clinic about his coumadin.  Thanks.

## 2019-02-13 ENCOUNTER — Ambulatory Visit (INDEPENDENT_AMBULATORY_CARE_PROVIDER_SITE_OTHER): Payer: Medicare Other

## 2019-02-13 ENCOUNTER — Other Ambulatory Visit: Payer: Self-pay

## 2019-02-13 ENCOUNTER — Encounter: Payer: Self-pay | Admitting: Orthopedic Surgery

## 2019-02-13 ENCOUNTER — Ambulatory Visit (INDEPENDENT_AMBULATORY_CARE_PROVIDER_SITE_OTHER): Payer: Medicare Other | Admitting: Orthopedic Surgery

## 2019-02-13 DIAGNOSIS — I739 Peripheral vascular disease, unspecified: Secondary | ICD-10-CM | POA: Diagnosis not present

## 2019-02-13 DIAGNOSIS — M79671 Pain in right foot: Secondary | ICD-10-CM

## 2019-02-13 DIAGNOSIS — M1A0710 Idiopathic chronic gout, right ankle and foot, without tophus (tophi): Secondary | ICD-10-CM | POA: Diagnosis not present

## 2019-02-13 DIAGNOSIS — I2510 Atherosclerotic heart disease of native coronary artery without angina pectoris: Secondary | ICD-10-CM | POA: Diagnosis not present

## 2019-02-13 DIAGNOSIS — E1142 Type 2 diabetes mellitus with diabetic polyneuropathy: Secondary | ICD-10-CM

## 2019-02-13 NOTE — Progress Notes (Signed)
Office Visit Note   Patient: [name]           Date of Birth: 03-30-1930           MRN: [id number] Visit Date: 02/13/2019              Requested by: Tonia Ghent, MD [address] [place],  [place] [address] PCP: Tonia Ghent, MD  Chief Complaint  Patient presents with   Right Foot - Pain      HPI: Patient is an 85 year old gentleman with a history of gout peripheral vascular disease diabetes atrial fibrillation who has had ulcer over the medial border of the right second toe.  Patient is completing a course of antibiotics.  Assessment & Plan: Visit Diagnoses:  1. Pain in right foot   2. PVD (peripheral vascular disease) ([place])   3. Chronic idiopathic gout involving toe of right foot without tophus   4. Diabetic polyneuropathy associated with type 2 diabetes mellitus ([place])     Plan: We will set the patient up with a consultation with vascular vein surgery to see first his ankle-brachial indices and then evaluate for potential need for revascularization to the right lower extremity.  In the meantime he will wash this wound with soap and water daily minimize his activities use a Band-Aid and antibiotic ointment and complete his antibiotics.  Follow-Up Instructions: Return in about 2 weeks (around 02/27/2019).   Ortho Exam  Patient is alert, oriented, no adenopathy, well-dressed, normal affect, normal respiratory effort. Examination patient does not have a palpable dorsalis pedis pulse the posterior tibial pulse is faint.  A Doppler was used and patient has an irregular pulse on the posterior tibial pulse with dampened monophasic flow consistent with atrial fibrillation and peripheral vascular disease.  I cannot Doppler a dorsalis pedis pulse.  Patient has an ulcer over the medial border of the second toe from pressure from the great toe.  This is approximately 5 mm in diameter 1 mm deep there is granulation tissue at the base there is no exposed bone or  tendon.  Patient's does have swelling and cellulitis of the second toe consistent with chronic infection.  Patient's last uric acid in the chart is from 1 year ago and his uric acid was 9.3.  Last hemoglobin A1c in October was 6.7.  Imaging: Xr Foot Complete Right  Result Date: 02/13/2019 Three-view radiographs of the right foot shows multiple areas of cystic changes periarticular.  There is also some sclerotic changes of the second toe consistent with possible osteomyelitis.  No images are attached to the encounter.  Labs: Lab Results  Component Value Date   HGBA1C 6.7 (A) 12/23/2018   HGBA1C 6.7 (H) 11/04/2017   HGBA1C  07/31/2008    6.1 (NOTE) The ADA recommends the following therapeutic goal for glycemic control related to Hgb A1c measurement: Goal of therapy: <6.5 Hgb A1c  Reference: American Diabetes Association: Clinical Practice Recommendations 2010, Diabetes Care, 2010, 33: (Suppl  1).   ESRSEDRATE 17 (H) 09/09/2017   CRP 1.1 (H) 09/09/2017   LABURIC 6.6 12/03/2017   LABURIC 9.3 (H) 11/04/2017   LABURIC 9.0 (H) 09/09/2017   REPTSTATUS 09/19/2017 FINAL 09/14/2017   GRAMSTAIN  09/10/2017    MODERATE WBC PRESENT,BOTH PMN AND MONONUCLEAR NO ORGANISMS SEEN    CULT  09/14/2017    NO GROWTH 5 DAYS Performed at [facility], [place] [address], [place], [place] [address]      Lab  Results  Component Value Date   ALBUMIN 3.2 (L) 09/14/2017   ALBUMIN 4.3 09/09/2017   ALBUMIN 4.4 02/13/2016   LABURIC 6.6 12/03/2017   LABURIC 9.3 (H) 11/04/2017   LABURIC 9.0 (H) 09/09/2017    Lab Results  Component Value Date   MG 2.2 09/09/2017   No results found for: VD25OH  No results found for: PREALBUMIN CBC EXTENDED Latest Ref Rng & Units 07/09/2018 12/03/2017 09/14/2017  WBC 4.0 - 10.5 K/uL 7.7 8.4 11.9(H)  RBC 4.22 - 5.81 MIL/uL 5.35 5.32 4.77  HGB 13.0 - 17.0 g/dL 15.4 15.7 13.9  HCT 39.0 - 52.0 % 47.3 46.1 42.3  PLT 150 - 400 K/uL 154 143.0 Result may be falsely  decreased due to platelet clumping.(L) 226  NEUTROABS 1.7 - 7.7 K/uL 5.5 5.2 9.8(H)  LYMPHSABS 0.7 - 4.0 K/uL 1.3 1.6 0.8     There is no height or weight on file to calculate BMI.  Orders:  Orders Placed This Encounter  Procedures   XR Foot Complete Right   No orders of the defined types were placed in this encounter.    Procedures: No procedures performed  Clinical Data: No additional findings.  ROS:  All other systems negative, except as noted in the HPI. Review of Systems  Objective: Vital Signs: There were no vitals taken for this visit.  Specialty Comments:  No specialty comments available.  PMFS History: Patient Active Problem List   Diagnosis Date Noted   Acute non-recurrent maxillary sinusitis 05/25/2018   Achilles tendinitis 12/05/2017   Idiopathic chronic gout, unspecified site, without tophus (tophi) 10/06/2017   Cutaneous abscess of right foot    Atypical angina ([place]) 93/71/6967   Acute diastolic heart failure (HCC)    CAD S/P percutaneous coronary angioplasty    Chronic atrial fibrillation (HCC)    CKD (chronic kidney disease), stage III    Persistent atrial fibrillation (HCC)    Hypertension    Hyperlipidemia    Bradycardia    Back pain 05/24/2015   Triceps tendonitis 05/24/2015   Diabetes mellitus without complication ([place]) 89/38/1017   Dysphagia, pharyngoesophageal phase 09/25/2014   History of esophageal stricture 09/25/2014   Chronic anticoagulation 51/04/5850   Helicobacter pylori duodenitis 09/25/2014   Cough 03/14/2014   Benign paroxysmal positional vertigo 03/14/2014   Encounter for therapeutic drug monitoring 04/12/2013   Esophageal stricture from GERD 06/08/2011   Advance care planning 04/20/2011   OSA on CPAP 09/22/2010   HYPERCHOLESTEROLEMIA 08/15/2008   Gout 08/15/2008   OBESITY 08/15/2008   Essential hypertension 08/15/2008   CAD (coronary artery disease), native coronary artery 08/15/2008     GASTROESOPHAGEAL REFLUX DISEASE 08/15/2008   Past Medical History:  Diagnosis Date   Bradycardia    a. nocturnal with pauses overnight on tele likely due to OSA 10/2015.   CKD (chronic kidney disease), stage III    Coronary artery disease    a. prev nonobst. b. LHC 10/24/15: occlusion of small diffusely diseased OM2 s/p balloon angioplasty, mild nonobstructive disease of mLAD and mRCA, normal LVEDP   Diabetes mellitus without complication ([place])    Diverticulosis    Esophageal reflux    Esophageal stricture from GERD 06/08/2011   With stricture at GE junction on EGD, dilated 06/2011    Gastric ulcer    on EGD 2013   Gout, unspecified    "on daily RX" (09/09/2017)   History of blood transfusion 02/1964   "when I had right little finger cut off"   Hyperlipidemia  Hypertension    Obesity, unspecified    Orthopnea    OSA on CPAP    Paroxysmal nocturnal dyspnea    Persistent atrial fibrillation (HCC)    Vertigo     Family History  Problem Relation Age of Onset   Stomach cancer Mother        died in 35's    Hypertension Father        died in his 45's pna likely dementia   Dementia Father    Pneumonia Father    Heart disease Brother        S/P CABG   Hypertension Other    Prostate cancer Neg Hx    Colon cancer Neg Hx     Past Surgical History:  Procedure Laterality Date   BALLOON DILATION N/A 10/22/2014   Procedure: BALLOON DILATION;  Surgeon: [name], MD;  Location: [facility];  Service: Endoscopy;  Laterality: N/A;   CARDIAC CATHETERIZATION  07/2008   CARDIAC CATHETERIZATION N/A 10/24/2015   Procedure: Left Heart Cath and Coronary Angiography;  Surgeon: [name], MD;  Location: [facility];  Service: Cardiovascular;  Laterality: N/A;   CARDIAC CATHETERIZATION N/A 10/24/2015   Procedure: Coronary Balloon Angioplasty;  Surgeon: [name], MD;  Location: [facility];  Service: Cardiovascular;  Laterality: N/A;    Carotid Dopplers  09/2009   no sig extracranial stenosis and vertebral arteries had antegrade flow   CATARACT EXTRACTION W/ INTRAOCULAR LENS  IMPLANT, BILATERAL Bilateral    ESOPHAGOGASTRODUODENOSCOPY (EGD) WITH ESOPHAGEAL DILATION     "I've had it stretched 3 times" (10/23/2015)   ESOPHAGOGASTRODUODENOSCOPY (EGD) WITH ESOPHAGEAL DILATION  06/2011   /medical hx above  (09/09/2017)   ESOPHAGOGASTRODUODENOSCOPY (EGD) WITH PROPOFOL N/A 10/22/2014   Procedure: ESOPHAGOGASTRODUODENOSCOPY (EGD) WITH PROPOFOL;  Surgeon: [name], MD;  [facility];  Service: Endoscopy;  Laterality: N/A;   FINGER SURGERY Right 02/1964   "cut little finger off; had it reattached"   I&D EXTREMITY Right 09/10/2017   Procedure: RIGHT FOOT DEBRIDEMENT;  Surgeon: [name], MD;  Location: [facility];  Service: Orthopedics;  Laterality: Right;   MRI of brain  09/2009   chronic microvascular ischemia   OSA Sleep Study     per Dr. 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

## 2019-02-13 NOTE — Telephone Encounter (Signed)
Daughter DPR advised.

## 2019-02-14 ENCOUNTER — Ambulatory Visit: Payer: Medicare Other | Admitting: Sports Medicine

## 2019-02-16 ENCOUNTER — Other Ambulatory Visit: Payer: Self-pay

## 2019-02-16 DIAGNOSIS — I739 Peripheral vascular disease, unspecified: Secondary | ICD-10-CM

## 2019-02-17 ENCOUNTER — Ambulatory Visit (HOSPITAL_COMMUNITY)
Admission: RE | Admit: 2019-02-17 | Discharge: 2019-02-17 | Disposition: A | Discharge status: Home / Self Care | Payer: Medicare Other | Source: Ambulatory Visit | Attending: Vascular Surgery | Admitting: Vascular Surgery

## 2019-02-17 ENCOUNTER — Ambulatory Visit (INDEPENDENT_AMBULATORY_CARE_PROVIDER_SITE_OTHER): Payer: Medicare Other | Admitting: Vascular Surgery

## 2019-02-17 ENCOUNTER — Other Ambulatory Visit: Payer: Self-pay

## 2019-02-17 ENCOUNTER — Ambulatory Visit (INDEPENDENT_AMBULATORY_CARE_PROVIDER_SITE_OTHER): Payer: Medicare Other | Admitting: *Deleted

## 2019-02-17 ENCOUNTER — Encounter: Payer: Self-pay | Admitting: Vascular Surgery

## 2019-02-17 VITALS — BP 114/75 | HR 72 | Temp 98.0°F | Resp 20 | Ht 64.5 in | Wt 216.0 lb

## 2019-02-17 DIAGNOSIS — I4819 Other persistent atrial fibrillation: Secondary | ICD-10-CM | POA: Diagnosis not present

## 2019-02-17 DIAGNOSIS — I739 Peripheral vascular disease, unspecified: Secondary | ICD-10-CM | POA: Diagnosis not present

## 2019-02-17 DIAGNOSIS — Z5181 Encounter for therapeutic drug level monitoring: Secondary | ICD-10-CM

## 2019-02-17 LAB — POCT INR: INR: 3.2 — AB (ref 2.0–3.0)

## 2019-02-17 NOTE — Patient Instructions (Addendum)
Description   Do not take any Warfarin tomorrow then continue on same dosage 1 tablet daily except 1.5 tablets each Sundays and Thursdays. Recheck INR in 5 weeks. [facility] [phone]    Call us if they continue your antibiotic or start you on a new medication. [facility] [phone]

## 2019-02-17 NOTE — Progress Notes (Signed)
Patient ID: [name], male   DOB: 1930/04/18, 85 y.o.   MRN: [id number]  Reason for Consult: New Patient (Initial Visit)   Referred by Tonia Ghent, MD  Subjective:     HPI:  [name] is a 85 y.o. male without significant history of vascular disease.  Does have risk factors of diabetes, chronic kidney disease, hypertension, hyperlipidemia.  He is a former smoker but quit many years ago.  Does take Coumadin for paroxysmal atrial fibrillation.  Stated recently that his right great toenail became long cut his right second toe.  Since that time he had ulceration has been evaluated by both podiatry and Dr. Sharol Given.  He is now clean this with soap and water and is sent for vascular evaluation.  He does not have any history of stroke does not have any history of aneurysm in his family.  Only significant family history related to vascular disease as heart attack in his grandfather.  He did undergo surgery on his right foot with Dr. Sharol Given within the last 2 years and this did heal well.  Past Medical History:  Diagnosis Date  . Bradycardia    a. nocturnal with pauses overnight on tele likely due to OSA 10/2015.  [name] CKD (chronic kidney disease), stage III   . Coronary artery disease    a. prev nonobst. b. LHC 10/24/15: occlusion of small diffusely diseased OM2 s/p balloon angioplasty, mild nonobstructive disease of mLAD and mRCA, normal LVEDP  . Diabetes mellitus without complication ([place])   . Diverticulosis   . Esophageal reflux   . Esophageal stricture from GERD 06/08/2011   With stricture at GE junction on EGD, dilated 06/2011   . Gastric ulcer    on EGD 2013  . Gout, unspecified    "on daily RX" (09/09/2017)  . History of blood transfusion 02/1964   "when I had right little finger cut off"  . Hyperlipidemia   . Hypertension   . Obesity, unspecified   . Orthopnea   . OSA on CPAP   . Paroxysmal nocturnal dyspnea   . Persistent atrial fibrillation ([place])   . Vertigo    Family  History  Problem Relation Age of Onset  . Stomach cancer Mother        died in 80's   . Hypertension Father        died in his 67's pna likely dementia  . Dementia Father   . Pneumonia Father   . Heart disease Brother        S/P CABG  . Hypertension Other   . Prostate cancer Neg Hx   . Colon cancer Neg Hx    Past Surgical History:  Procedure Laterality Date  . BALLOON DILATION N/A 10/22/2014   Procedure: BALLOON DILATION;  Surgeon: [name], MD;  [facility];  Service: Endoscopy;  Laterality: N/A;  . CARDIAC CATHETERIZATION  07/2008  . CARDIAC CATHETERIZATION N/A 10/24/2015   Procedure: Left Heart Cath and Coronary Angiography;  Surgeon: [name], MD;  Location: [facility];  Service: Cardiovascular;  Laterality: N/A;  . CARDIAC CATHETERIZATION N/A 10/24/2015   Procedure: Coronary Balloon Angioplasty;  Surgeon: [name], MD;  Location: [facility];  Service: Cardiovascular;  Laterality: N/A;  . Carotid Dopplers  09/2009   no sig extracranial stenosis and vertebral arteries had antegrade flow  . CATARACT EXTRACTION W/ INTRAOCULAR LENS  IMPLANT, BILATERAL Bilateral   . ESOPHAGOGASTRODUODENOSCOPY (EGD) WITH ESOPHAGEAL DILATION     "  I've had it stretched 3 times" (10/23/2015)  . ESOPHAGOGASTRODUODENOSCOPY (EGD) WITH ESOPHAGEAL DILATION  06/2011   /medical hx above  (09/09/2017)  . ESOPHAGOGASTRODUODENOSCOPY (EGD) WITH PROPOFOL N/A 10/22/2014   Procedure: ESOPHAGOGASTRODUODENOSCOPY (EGD) WITH PROPOFOL;  Surgeon: [name], MD;  [facility];  Service: Endoscopy;  Laterality: N/A;  . FINGER SURGERY Right 02/1964   "cut little finger off; had it reattached"  . I&D EXTREMITY Right 09/10/2017   Procedure: RIGHT FOOT DEBRIDEMENT;  Surgeon: [name], MD;  Location: [facility];  Service: Orthopedics;  Laterality: Right;  . MRI of brain  09/2009   chronic microvascular ischemia  . OSA Sleep Study     per Dr. Brett Fairy  . SHOULDER ARTHROSCOPY  W/ ROTATOR CUFF REPAIR Right 2008    Short Social History:  Social History   Tobacco Use  . Smoking status: Former Smoker    Packs/day: 0.12    Years: 4.00    Pack years: 0.48    Types: Cigarettes    Quit date: 03/16/1969    Years since quitting: 49.9  . Smokeless tobacco: Former Systems developer  . Tobacco comment: "chewed when I smoked; smoked 1 pack/week for about 4 years, quit 1971  Substance Use Topics  . Alcohol use: Not Currently    Alcohol/week: 0.0 standard drinks    Comment: 09/09/2017  "Quit all alcohol in 1970's"    Allergies  Allergen Reactions  . Penicillins Shortness Of Breath and Swelling    Patient tolerated Rocephin injection 2019  PATIENT HAS HAD A PCN REACTION WITH IMMEDIATE RASH, FACIAL/TONGUE/THROAT SWELLING, SOB, OR LIGHTHEADEDNESS WITH HYPOTENSION:  #  #  YES  #  Has patient had a PCN reaction causing severe rash involving mucus membranes or skin necrosis: NO Has patient had a PCN reaction that required hospitalization NO Has patient had a PCN reaction occurring within the last 10 years: NO If all of the above answers are "NO", then may proceed with Cephalosporin use.   . Atorvastatin Other (See Comments)    No energy, just felt bad    . Pravastatin Other (See Comments)    myalgias  . Hydrocodone Other (See Comments)    "swimmy headed" after use  . Uloric [Febuxostat] Other (See Comments)    Would avoid, worsening gout sx with use.   . Vancomycin Other (See Comments)    Red man syndrome- tolerated with slow run and benadryl use  . Biaxin [Clarithromycin] Diarrhea  . Doxycycline Other (See Comments)    GI upset    Current Outpatient Medications  Medication Sig Dispense Refill  . acetaminophen (TYLENOL) 500 MG tablet Take 1,000 mg by mouth as needed for mild pain.    [name] allopurinol (ZYLOPRIM) 100 MG tablet TAKE 1 TABLET BY MOUTH EVERY DAY 90 tablet 3  . colchicine 0.6 MG tablet TAKE 1 TABLET BY MOUTH DAILY IF NEEDED-CAN TAKE 2ND TAB PER DAY IF GOUT FLARE. 60  tablet 1  . diltiazem (CARDIZEM CD) 180 MG 24 hr capsule TAKE 1 CAPSULE (180 MG TOTAL) BY MOUTH DAILY. 90 capsule 2  . furosemide (LASIX) 20 MG tablet TAKE 1 TABLET (20 MG TOTAL) BY MOUTH DAILY. 90 tablet 1  . lisinopril (ZESTRIL) 20 MG tablet TAKE 1 TABLET BY MOUTH DAILY. PLEASE KEEP UPCOMING APPT IN MARCH WITH DR. Burt Knack FOR FUTURE REFILLS 90 tablet 3  . metoprolol tartrate (LOPRESSOR) 25 MG tablet Take 0.5 tablets (12.5 mg total) by mouth 2 (two) times daily. 30 tablet 3  . mupirocin ointment (  BACTROBAN) 2 %     . nitroGLYCERIN (NITROSTAT) 0.4 MG SL tablet Place 1 tablet (0.4 mg total) under the tongue every 5 (five) minutes x 3 doses as needed for chest pain. 25 tablet 2  . omeprazole (PRILOSEC) 40 MG capsule TAKE 1 CAPSULE BY MOUTH EVERY DAY 90 capsule 1  . sulfamethoxazole-trimethoprim (BACTRIM DS) 800-160 MG tablet Take 1 tablet by mouth 2 (two) times daily. 20 tablet 0  . warfarin (COUMADIN) 4 MG tablet TAKE AS DIRECTED BY COUMADIN CLINIC 120 tablet 1   No current facility-administered medications for this visit.     Review of Systems  Constitutional:  Constitutional negative. HENT: HENT negative.  Eyes: Eyes negative.  Respiratory: Respiratory negative.  Cardiovascular: Cardiovascular negative.  GI: Gastrointestinal negative.  Skin: Positive for wound.  Hematologic: Hematologic/lymphatic negative.  Psychiatric: Psychiatric negative.        Objective:  Objective   Vitals:   02/17/19 1022  BP: 114/75  Pulse: 72  Resp: 20  Temp: 98 F (36.7 C)  SpO2: 99%  Weight: 216 lb (98 kg)  Height: 5' 4.5" (1.638 m)   Body mass index is 36.5 kg/m.  Physical Exam HENT:     Head: Normocephalic.  Neck:     Vascular: No carotid bruit.  Cardiovascular:     Rate and Rhythm: Normal rate and regular rhythm.     Pulses:          Carotid pulses are 2+ on the right side and 2+ on the left side.      Radial pulses are 2+ on the right side and 2+ on the left side.       Dorsalis  pedis pulses are detected w/ Doppler on the right side and 1+ on the left side.       Posterior tibial pulses are 1+ on the right side and detected w/ Doppler on the left side.  Musculoskeletal:        General: Swelling present.  Skin:    Capillary Refill: Capillary refill takes less than 2 seconds.     Comments: There is a wound on the medial aspect of the right second toe  Neurological:     General: No focal deficit present.     Mental Status: He is alert.  Psychiatric:        Mood and Affect: Mood normal.        Thought Content: Thought content normal.        Judgment: Judgment normal.     Data: I have independently interpreted his ABIs to be 1.1 right and 1.36 left.  Toe pressure on the right was 92 and left 108.  He has triphasic waveforms on the right relative to the posterior tibial in both the posterior tibial and dorsalis pedis are triphasic on the left.     Assessment/Plan:     85 year old male with right second toe wound.  ABIs are preserved.  Appears to have microvascular disease.  Despite the elevated ABIs very difficult to palpate pulses but he does have good waveforms and also has good sound by Doppler.  With this I think he does not need any vascular invention and should be cleared from surgical standpoint for Dr. Sharol Given should surgery be necessary.  Patient and his daughter state they have scheduled appointment with him in 2 weeks.     Waynetta Sandy MD [facility]

## 2019-02-27 ENCOUNTER — Ambulatory Visit (INDEPENDENT_AMBULATORY_CARE_PROVIDER_SITE_OTHER): Payer: Medicare Other | Admitting: Orthopedic Surgery

## 2019-02-27 ENCOUNTER — Other Ambulatory Visit: Payer: Self-pay

## 2019-02-27 ENCOUNTER — Encounter: Payer: Self-pay | Admitting: Orthopedic Surgery

## 2019-02-27 VITALS — Ht 64.0 in | Wt 216.0 lb

## 2019-02-27 DIAGNOSIS — M869 Osteomyelitis, unspecified: Secondary | ICD-10-CM

## 2019-02-27 DIAGNOSIS — E1142 Type 2 diabetes mellitus with diabetic polyneuropathy: Secondary | ICD-10-CM

## 2019-02-27 DIAGNOSIS — I2510 Atherosclerotic heart disease of native coronary artery without angina pectoris: Secondary | ICD-10-CM | POA: Diagnosis not present

## 2019-02-27 NOTE — Progress Notes (Signed)
Office Visit Note   Patient: [name]           Date of Birth: [birth year]           MRN: [id number] Visit Date: 02/27/2019              Requested by: Tonia Ghent, MD [address] [place],  [place] [address] PCP: Tonia Ghent, MD  Chief Complaint  Patient presents with  . Right Foot - Follow-up      HPI: Patient is a 85 year old gentleman who was seen in follow-up for osteomyelitis and ulceration right foot second toe.  Patient has been to vascular vein surgery ankle-brachial indices shows adequate circulation.  Patient has completed his course of antibiotics.  Patient complains of redness swelling denies any drainage.  Patient was seen with his daughter on the phone.  Assessment & Plan: Visit Diagnoses:  1. Diabetic polyneuropathy associated with type 2 diabetes mellitus ([place])   2. Osteomyelitis of second toe of right foot ([place])     Plan: Patient and daughter state they would like to proceed with amputation of the second toe right foot discussed the importance of minimizing weightbearing for the first 2 weeks.  Risks and benefits were discussed including risk of the wound not healing.  Patient and daughter state they understand and wish to proceed with surgery this week we will set it up for Friday as outpatient surgery.  Follow-Up Instructions: Return in about 2 weeks (around 03/13/2019).   Ortho Exam  Patient is alert, oriented, no adenopathy, well-dressed, normal affect, normal respiratory effort. Examination patient has venous swelling he has sausage digit swelling of the second toe.  He has a callus in the first webspace of the second toe PIP joint.  The callus was removed and patient has an ulcer that probes down to the PIP joint with exposed bone.  ABIs show adequate circulation.  Imaging: No results found. No images are attached to the encounter.  Labs: Lab Results  Component Value Date   HGBA1C 6.7 (A) 12/23/2018   HGBA1C 6.7 (H) 11/04/2017    HGBA1C  07/31/2008    6.1 (NOTE) The ADA recommends the following therapeutic goal for glycemic control related to Hgb A1c measurement: Goal of therapy: <6.5 Hgb A1c  Reference: American Diabetes Association: Clinical Practice Recommendations 2010, Diabetes Care, 2010, 33: (Suppl  1).   ESRSEDRATE 17 (H) 09/09/2017   CRP 1.1 (H) 09/09/2017   LABURIC 6.6 12/03/2017   LABURIC 9.3 (H) 11/04/2017   LABURIC 9.0 (H) 09/09/2017   REPTSTATUS 09/19/2017 FINAL 09/14/2017   GRAMSTAIN  09/10/2017    MODERATE WBC PRESENT,BOTH PMN AND MONONUCLEAR NO ORGANISMS SEEN    CULT  09/14/2017    NO GROWTH 5 DAYS Performed at [facility], [place] [address] [place], [place] [address]      Lab Results  Component Value Date   ALBUMIN 3.2 (L) 09/14/2017   ALBUMIN 4.3 09/09/2017   ALBUMIN 4.4 02/13/2016   LABURIC 6.6 12/03/2017   LABURIC 9.3 (H) 11/04/2017   LABURIC 9.0 (H) 09/09/2017    Lab Results  Component Value Date   MG 2.2 09/09/2017   No results found for: VD25OH  No results found for: PREALBUMIN CBC EXTENDED Latest Ref Rng & Units 07/09/2018 12/03/2017 09/14/2017  WBC 4.0 - 10.5 K/uL 7.7 8.4 11.9(H)  RBC 4.22 - 5.81 MIL/uL 5.35 5.32 4.77  HGB 13.0 - 17.0 g/dL 15.4 15.7 13.9  HCT 39.0 - 52.0 %  47.3 46.1 42.3  PLT 150 - 400 K/uL 154 143.0 Result may be falsely decreased due to platelet clumping.(L) 226  NEUTROABS 1.7 - 7.7 K/uL 5.5 5.2 9.8(H)  LYMPHSABS 0.7 - 4.0 K/uL 1.3 1.6 0.8     Body mass index is 37.08 kg/m.  Orders:  No orders of the defined types were placed in this encounter.  No orders of the defined types were placed in this encounter.    Procedures: No procedures performed  Clinical Data: No additional findings.  ROS:  All other systems negative, except as noted in the HPI. Review of Systems  Objective: Vital Signs: Ht 5\' 4"  (1.626 m)   Wt 216 lb (98 kg)   BMI 37.08 kg/m   Specialty Comments:  No specialty comments available.  PMFS History:  Patient Active Problem List   Diagnosis Date Noted  . Acute non-recurrent maxillary sinusitis 05/25/2018  . Achilles tendinitis 12/05/2017  . Idiopathic chronic gout, unspecified site, without tophus (tophi) 10/06/2017  . Cutaneous abscess of right foot   . Atypical angina ([place]) 11/08/2015  . Acute diastolic heart failure ([place])   . CAD S/P percutaneous coronary angioplasty   . Chronic atrial fibrillation ([place])   . CKD (chronic kidney disease), stage III   . Persistent atrial fibrillation ([place])   . Hypertension   . Hyperlipidemia   . Bradycardia   . Back pain 05/24/2015  . Triceps tendonitis 05/24/2015  . Diabetes mellitus without complication ([place]) 50/11/3816  . Dysphagia, pharyngoesophageal phase 09/25/2014  . History of esophageal stricture 09/25/2014  . Chronic anticoagulation 09/25/2014  . Helicobacter pylori duodenitis 09/25/2014  . Cough 03/14/2014  . Benign paroxysmal positional vertigo 03/14/2014  . Encounter for therapeutic drug monitoring 04/12/2013  . Esophageal stricture from GERD 06/08/2011  . Advance care planning 04/20/2011  . OSA on CPAP 09/22/2010  . HYPERCHOLESTEROLEMIA 08/15/2008  . Gout 08/15/2008  . OBESITY 08/15/2008  . Essential hypertension 08/15/2008  . CAD (coronary artery disease), native coronary artery 08/15/2008  . GASTROESOPHAGEAL REFLUX DISEASE 08/15/2008   Past Medical History:  Diagnosis Date  . Bradycardia    a. nocturnal with pauses overnight on tele likely due to OSA 10/2015.  [name] CKD (chronic kidney disease), stage III   . Coronary artery disease    a. prev nonobst. b. LHC 10/24/15: occlusion of small diffusely diseased OM2 s/p balloon angioplasty, mild nonobstructive disease of mLAD and mRCA, normal LVEDP  . Diabetes mellitus without complication ([place])   . Diverticulosis   . Esophageal reflux   . Esophageal stricture from GERD 06/08/2011   With stricture at GE junction on EGD, dilated 06/2011   . Gastric ulcer    on EGD 2013  . Gout,  unspecified    "on daily RX" (09/09/2017)  . History of blood transfusion 02/1964   "when I had right little finger cut off"  . Hyperlipidemia   . Hypertension   . Obesity, unspecified   . Orthopnea   . OSA on CPAP   . Paroxysmal nocturnal dyspnea   . Persistent atrial fibrillation ([place])   . Vertigo     Family History  Problem Relation Age of Onset  . Stomach cancer Mother        died in 58's   . Hypertension Father        died in his 44's pna likely dementia  . Dementia Father   . Pneumonia Father   . Heart disease Brother        S/P CABG  .  Hypertension Other   . Prostate cancer Neg Hx   . Colon cancer Neg Hx     Past Surgical History:  Procedure Laterality Date  . BALLOON DILATION N/A 10/22/2014   Procedure: BALLOON DILATION;  Surgeon: [name], MD;  Location: [facility];  Service: Endoscopy;  Laterality: N/A;  . CARDIAC CATHETERIZATION  07/2008  . CARDIAC CATHETERIZATION N/A 10/24/2015   Procedure: Left Heart Cath and Coronary Angiography;  Surgeon: [name], MD;  Location: [facility];  Service: Cardiovascular;  Laterality: N/A;  . CARDIAC CATHETERIZATION N/A 10/24/2015   Procedure: Coronary Balloon Angioplasty;  Surgeon: [name], MD;  Location: [facility];  Service: Cardiovascular;  Laterality: N/A;  . Carotid Dopplers  09/2009   no sig extracranial stenosis and vertebral arteries had antegrade flow  . CATARACT EXTRACTION W/ INTRAOCULAR LENS  IMPLANT, BILATERAL Bilateral   . ESOPHAGOGASTRODUODENOSCOPY (EGD) WITH ESOPHAGEAL DILATION     "I've had it stretched 3 times" (10/23/2015)  . ESOPHAGOGASTRODUODENOSCOPY (EGD) WITH ESOPHAGEAL DILATION  06/2011   /medical hx above  (09/09/2017)  . ESOPHAGOGASTRODUODENOSCOPY (EGD) WITH PROPOFOL N/A 10/22/2014   Procedure: ESOPHAGOGASTRODUODENOSCOPY (EGD) WITH PROPOFOL;  Surgeon: [name], MD;  [facility]: [facility];  Service: Endoscopy;  Laterality: N/A;  . FINGER SURGERY Right 02/1964   "cut  little finger off; had it reattached"  . I&D EXTREMITY Right 09/10/2017   Procedure: RIGHT FOOT DEBRIDEMENT;  Surgeon: [name], MD;  Location: [facility];  Service: Orthopedics;  Laterality: Right;  . MRI of brain  09/2009   chronic microvascular ischemia  . OSA Sleep Study     per Dr. Brett Fairy  . SHOULDER ARTHROSCOPY W/ ROTATOR CUFF REPAIR Right 2008   Social History   Occupational History  . 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 Smoking status: Former Smoker    Packs/day: 0.12    Years: 4.00    Pack years: 0.48    Types: Cigarettes    Quit date: 03/16/1969    Years since quitting: 49.9  . Smokeless tobacco: Former Systems developer  . Tobacco comment: "chewed when I smoked; smoked 1 pack/week for about 4 years, quit 1971  Substance and Sexual Activity  . Alcohol use: Not Currently    Alcohol/week: 0.0 standard drinks    Comment: 09/09/2017  "Quit all alcohol in 1970's"  . Drug use: Never  . Sexual activity: Not on file

## 2019-02-28 ENCOUNTER — Other Ambulatory Visit: Payer: Self-pay | Admitting: Physician Assistant

## 2019-02-28 ENCOUNTER — Other Ambulatory Visit (HOSPITAL_COMMUNITY)
Admission: RE | Admit: 2019-02-28 | Discharge: 2019-02-28 | Disposition: A | Discharge status: Home / Self Care | Payer: Medicare Other | Source: Ambulatory Visit | Attending: Orthopedic Surgery | Admitting: Orthopedic Surgery

## 2019-02-28 DIAGNOSIS — Z20828 Contact with and (suspected) exposure to other viral communicable diseases: Secondary | ICD-10-CM | POA: Insufficient documentation

## 2019-02-28 DIAGNOSIS — Z01812 Encounter for preprocedural laboratory examination: Secondary | ICD-10-CM | POA: Insufficient documentation

## 2019-03-01 LAB — NOVEL CORONAVIRUS, NAA (HOSP ORDER, SEND-OUT TO REF LAB; TAT 18-24 HRS): SARS-CoV-2, NAA: NOT DETECTED

## 2019-03-02 ENCOUNTER — Encounter (HOSPITAL_COMMUNITY): Payer: Self-pay | Admitting: Orthopedic Surgery

## 2019-03-02 NOTE — Progress Notes (Signed)
Anesthesia Chart Review: [name]   Case: [id number] Date/Time: 03/03/19 0831   Procedure: RIGHT 2ND TOE AMPUTATION (Right )   Anesthesia type: Choice   Pre-op diagnosis: Osteomyelitis Right 2nd Toe   Location: [facility] OR ROOM 05 / [facility]   Surgeons: [name], MD      DISCUSSION: Patient is an 85 year old male scheduled for the above procedure.  History includes former smoker (quit 1971), CAD (s/p PTCA OM2, no stent due to small size and diffuse disease 10/24/15), afib (on warfarin), bradycardia (had bradycardia with pauses 2010, reduced metoprolol and diltiazem with improvement; OSA possibly a contributing factor), HTN, HLD, DM2, CKD (stage III), GERD, vertigo, OSA, obesity.   02/28/19 COVID-19 test negative. He is for labs and anesthesia team evaluation on the day of surgery.    VS:  BP Readings from Last 3 Encounters:  02/17/19 114/75  12/23/18 138/78  08/10/18 135/70    PROVIDERS: Tonia Ghent, MD is PCP  Sherren Mocha, MD is cardiologist. Last evaluation 08/10/18 with Leanor Kail, [place]. He was felt to be doing well from a cardiac standpoint. 1 year follow-up recommended.  Servando Snare, MD is vascular surgeon   LABS: For day of surgery. As of 07/09/18, Cr 1.31, glucose 106, CBC WNL.     IMAGES: 1V PCXR 07/09/18: FINDINGS: 1227 hours. Lordotic positioning. The heart size and mediastinal contours are stable. Aortic atherosclerosis, calcified right hilar lymph nodes and scattered calcified granulomas in both lungs are again noted. There is no confluent airspace opacity, pleural effusion pneumothorax. No acute osseous findings are evident. IMPRESSION: Stable chest with sequela of prior granulomatous disease. No acute cardiopulmonary process.   EKG: Afib at 91 bpm. Low voltage, precordial leads. No significant change since last tracing.    CV: Echo 10/28/15: Study Conclusions - Left ventricle: The cavity size was normal. Wall thickness was   normal.  Systolic function was normal. The estimated ejection   fraction was in the range of 55% to 60%. Wall motion was normal;   there were no regional wall motion abnormalities. - Mitral valve: Moderately calcified annulus. Mildly thickened,   mildly calcified leaflets . Valve area by continuity equation   (using LVOT flow): 1.28 cm^2. - Left atrium: The atrium was moderately dilated. - Right ventricle: The cavity size was mildly dilated. Systolic   function was mildly reduced.  Cardiac cath 10/24/15:  Mid RCA lesion, 15 %stenosed.  Ost 2nd Mrg to 2nd Mrg lesion, 50 %stenosed.  Mid LAD lesion, 30 %stenosed.  2nd Mrg lesion, 100 %stenosed.  Post intervention, there is a 30% residual stenosis.  LV end diastolic pressure is normal. 1.  Occlusion of small, diffusely diseased OM2 branch. 2.  Mild, non-obstructive coronary artery disease involving the mid LAD and mid RCA. 3.  Normal left ventricular filling pressure (LVEDP 13 mmHg). 4.  Successful balloon angioplasty of OM2 using 2.0 x 12 mm balloon with improvement in stenosis from 100% to 30% with restoration of TIMI-3 flow.  Stent was not placed due to the small vessel size with diffuse disease extending to the ostium.   Past Medical History:  Diagnosis Date  . Bradycardia    a. nocturnal with pauses overnight on tele likely due to OSA 10/2015.  [name] Cancer ([place])    skin cancer  . CKD (chronic kidney disease), stage III   . Coronary artery disease    a. prev nonobst. b. LHC 10/24/15: occlusion of small diffusely diseased OM2 s/p balloon angioplasty,  mild nonobstructive disease of mLAD and mRCA, normal LVEDP  . Diabetes mellitus without complication ([place])   . Diverticulosis   . Esophageal reflux   . Esophageal stricture from GERD 06/08/2011   With stricture at GE junction on EGD, dilated 06/2011   . Gastric ulcer    on EGD 2013  . Gout, unspecified    "on daily RX" (09/09/2017)  . History of blood transfusion 02/1964   "when I had right  little finger cut off"  . Hyperlipidemia   . Hypertension   . Obesity, unspecified   . Orthopnea   . OSA on CPAP   . Paroxysmal nocturnal dyspnea   . Persistent atrial fibrillation ([place])   . Vertigo     Past Surgical History:  Procedure Laterality Date  . BALLOON DILATION N/A 10/22/2014   Procedure: BALLOON DILATION;  Surgeon: [name], MD;  [facility];  Service: Endoscopy;  Laterality: N/A;  . CARDIAC CATHETERIZATION  07/2008  . CARDIAC CATHETERIZATION N/A 10/24/2015   Procedure: Left Heart Cath and Coronary Angiography;  Surgeon: [name], MD;  Location: [facility];  Service: Cardiovascular;  Laterality: N/A;  . CARDIAC CATHETERIZATION N/A 10/24/2015   Procedure: Coronary Balloon Angioplasty;  Surgeon: [name], MD;  Location: [facility];  Service: Cardiovascular;  Laterality: N/A;  . Carotid Dopplers  09/2009   no sig extracranial stenosis and vertebral arteries had antegrade flow  . CATARACT EXTRACTION W/ INTRAOCULAR LENS  IMPLANT, BILATERAL Bilateral   . ESOPHAGOGASTRODUODENOSCOPY (EGD) WITH ESOPHAGEAL DILATION     "I've had it stretched 3 times" (10/23/2015)  . ESOPHAGOGASTRODUODENOSCOPY (EGD) WITH ESOPHAGEAL DILATION  06/2011   /medical hx above  (09/09/2017)  . ESOPHAGOGASTRODUODENOSCOPY (EGD) WITH PROPOFOL N/A 10/22/2014   Procedure: ESOPHAGOGASTRODUODENOSCOPY (EGD) WITH PROPOFOL;  Surgeon: [name], MD;  [facility];  Service: Endoscopy;  Laterality: N/A;  . FINGER SURGERY Right 02/1964   "cut little finger off; had it reattached"  . I & D EXTREMITY Right 09/10/2017   Procedure: RIGHT FOOT DEBRIDEMENT;  Surgeon: [name], MD;  Location: [facility];  Service: Orthopedics;  Laterality: Right;  . MRI of brain  09/2009   chronic microvascular ischemia  . OSA Sleep Study     per Dr. Brett Fairy  . SHOULDER ARTHROSCOPY W/ ROTATOR CUFF REPAIR Right 2008    MEDICATIONS: No current facility-administered medications for this  encounter.   [name] acetaminophen (TYLENOL) 500 MG tablet  . allopurinol (ZYLOPRIM) 100 MG tablet  . colchicine 0.6 MG tablet  . diclofenac Sodium (VOLTAREN) 1 % GEL  . diltiazem (CARDIZEM CD) 180 MG 24 hr capsule  . furosemide (LASIX) 20 MG tablet  . lisinopril (ZESTRIL) 20 MG tablet  . loratadine (CLARITIN) 10 MG tablet  . metoprolol tartrate (LOPRESSOR) 25 MG tablet  . mupirocin ointment (BACTROBAN) 2 %  . nitroGLYCERIN (NITROSTAT) 0.4 MG SL tablet  . omeprazole (PRILOSEC) 40 MG capsule  . warfarin (COUMADIN) 4 MG tablet  . sulfamethoxazole-trimethoprim (BACTRIM DS) 800-160 MG tablet    Myra Gianotti, PA-C Surgical Short Stay/Anesthesiology [facility] Phone [phone] [facility] Phone [phone] 03/02/2019 2:53 PM

## 2019-03-02 NOTE — Anesthesia Preprocedure Evaluation (Addendum)
Anesthesia Evaluation  Patient identified by MRN, date of birth, ID band Patient awake    Reviewed: Allergy & Precautions, NPO status , Patient's Chart, lab work & pertinent test results, reviewed documented beta blocker date and time   History of Anesthesia Complications Negative for: history of anesthetic complications  Airway Mallampati: III  TM Distance: >3 FB Neck ROM: Full    Dental  (+) Dental Advisory Given   Pulmonary sleep apnea and Continuous Positive Airway Pressure Ventilation , former smoker,    Pulmonary exam normal        Cardiovascular hypertension, Pt. on home beta blockers and Pt. on medications + CAD  Normal cardiovascular exam+ dysrhythmias Atrial Fibrillation    '17 TTE - EF 55% to 60%. LA was moderately dilated. RV size was mildly dilated. Systolic  function was mildly reduced.    Neuro/Psych negative neurological ROS  negative psych ROS   GI/Hepatic Neg liver ROS, PUD, GERD  Medicated and Controlled,  Endo/Other  diabetes, Type 2 Obesity   Renal/GU CRFRenal disease     Musculoskeletal  (+) Arthritis ,   Abdominal   Peds  Hematology negative hematology ROS (+)   Anesthesia Other Findings Covid neg 12/15  Reproductive/Obstetrics                            Anesthesia Physical Anesthesia Plan  ASA: IV  Anesthesia Plan: General   Post-op Pain Management:    Induction: Intravenous  PONV Risk Score and Plan: 2 and Treatment may vary due to age or medical condition and Ondansetron  Airway Management Planned: LMA  Additional Equipment: None  Intra-op Plan:   Post-operative Plan: Extubation in OR  Informed Consent: I have reviewed the patients History and Physical, chart, labs and discussed the procedure including the risks, benefits and alternatives for the proposed anesthesia with the patient or authorized representative who has indicated his/her  understanding and acceptance.     Dental advisory given  Plan Discussed with: CRNA and Anesthesiologist  Anesthesia Plan Comments:       Anesthesia Quick Evaluation

## 2019-03-03 ENCOUNTER — Encounter (HOSPITAL_COMMUNITY)
Admission: RE | Disposition: A | Discharge status: Home / Self Care | Payer: Self-pay | Source: Home / Self Care | Attending: Orthopedic Surgery

## 2019-03-03 ENCOUNTER — Telehealth: Payer: Self-pay

## 2019-03-03 ENCOUNTER — Encounter (HOSPITAL_COMMUNITY): Payer: Self-pay | Admitting: Orthopedic Surgery

## 2019-03-03 ENCOUNTER — Ambulatory Visit (HOSPITAL_COMMUNITY): Payer: Medicare Other | Admitting: Vascular Surgery

## 2019-03-03 ENCOUNTER — Telehealth: Payer: Self-pay | Admitting: Orthopedic Surgery

## 2019-03-03 ENCOUNTER — Ambulatory Visit (HOSPITAL_COMMUNITY)
Admission: RE | Admit: 2019-03-03 | Discharge: 2019-03-03 | Disposition: A | Discharge status: Home / Self Care | Payer: Medicare Other | Attending: Orthopedic Surgery | Admitting: Orthopedic Surgery

## 2019-03-03 ENCOUNTER — Other Ambulatory Visit: Payer: Self-pay

## 2019-03-03 DIAGNOSIS — N183 Chronic kidney disease, stage 3 unspecified: Secondary | ICD-10-CM | POA: Insufficient documentation

## 2019-03-03 DIAGNOSIS — Z87891 Personal history of nicotine dependence: Secondary | ICD-10-CM | POA: Insufficient documentation

## 2019-03-03 DIAGNOSIS — Z6831 Body mass index (BMI) 31.0-31.9, adult: Secondary | ICD-10-CM | POA: Diagnosis not present

## 2019-03-03 DIAGNOSIS — E669 Obesity, unspecified: Secondary | ICD-10-CM | POA: Insufficient documentation

## 2019-03-03 DIAGNOSIS — K219 Gastro-esophageal reflux disease without esophagitis: Secondary | ICD-10-CM | POA: Diagnosis not present

## 2019-03-03 DIAGNOSIS — Z885 Allergy status to narcotic agent status: Secondary | ICD-10-CM | POA: Diagnosis not present

## 2019-03-03 DIAGNOSIS — I129 Hypertensive chronic kidney disease with stage 1 through stage 4 chronic kidney disease, or unspecified chronic kidney disease: Secondary | ICD-10-CM | POA: Diagnosis not present

## 2019-03-03 DIAGNOSIS — Z79899 Other long term (current) drug therapy: Secondary | ICD-10-CM | POA: Insufficient documentation

## 2019-03-03 DIAGNOSIS — I4891 Unspecified atrial fibrillation: Secondary | ICD-10-CM | POA: Insufficient documentation

## 2019-03-03 DIAGNOSIS — M109 Gout, unspecified: Secondary | ICD-10-CM | POA: Diagnosis not present

## 2019-03-03 DIAGNOSIS — Z7901 Long term (current) use of anticoagulants: Secondary | ICD-10-CM | POA: Diagnosis not present

## 2019-03-03 DIAGNOSIS — Z881 Allergy status to other antibiotic agents status: Secondary | ICD-10-CM | POA: Diagnosis not present

## 2019-03-03 DIAGNOSIS — E1169 Type 2 diabetes mellitus with other specified complication: Secondary | ICD-10-CM | POA: Insufficient documentation

## 2019-03-03 DIAGNOSIS — Z888 Allergy status to other drugs, medicaments and biological substances status: Secondary | ICD-10-CM | POA: Diagnosis not present

## 2019-03-03 DIAGNOSIS — L97519 Non-pressure chronic ulcer of other part of right foot with unspecified severity: Secondary | ICD-10-CM | POA: Insufficient documentation

## 2019-03-03 DIAGNOSIS — Z8249 Family history of ischemic heart disease and other diseases of the circulatory system: Secondary | ICD-10-CM | POA: Diagnosis not present

## 2019-03-03 DIAGNOSIS — Z880 Allergy status to penicillin: Secondary | ICD-10-CM | POA: Insufficient documentation

## 2019-03-03 DIAGNOSIS — E1142 Type 2 diabetes mellitus with diabetic polyneuropathy: Secondary | ICD-10-CM | POA: Diagnosis not present

## 2019-03-03 DIAGNOSIS — Z85828 Personal history of other malignant neoplasm of skin: Secondary | ICD-10-CM | POA: Diagnosis not present

## 2019-03-03 DIAGNOSIS — E11621 Type 2 diabetes mellitus with foot ulcer: Secondary | ICD-10-CM | POA: Insufficient documentation

## 2019-03-03 DIAGNOSIS — M869 Osteomyelitis, unspecified: Secondary | ICD-10-CM

## 2019-03-03 DIAGNOSIS — I2510 Atherosclerotic heart disease of native coronary artery without angina pectoris: Secondary | ICD-10-CM | POA: Insufficient documentation

## 2019-03-03 DIAGNOSIS — E1122 Type 2 diabetes mellitus with diabetic chronic kidney disease: Secondary | ICD-10-CM | POA: Diagnosis not present

## 2019-03-03 HISTORY — DX: Malignant (primary) neoplasm, unspecified: C80.1

## 2019-03-03 HISTORY — PX: AMPUTATION: SHX166

## 2019-03-03 LAB — BASIC METABOLIC PANEL
Anion gap: 14 (ref 5–15)
BUN: 23 mg/dL (ref 8–23)
CO2: 20 mmol/L — ABNORMAL LOW (ref 22–32)
Calcium: 9 mg/dL (ref 8.9–10.3)
Chloride: 107 mmol/L (ref 98–111)
Creatinine, Ser: 1.35 mg/dL — ABNORMAL HIGH (ref 0.61–1.24)
GFR calc Af Amer: 55 mL/min — ABNORMAL LOW (ref >60)
GFR calc non Af Amer: 47 mL/min — ABNORMAL LOW (ref >60)
Glucose, Bld: 109 mg/dL — ABNORMAL HIGH (ref 70–99)
Potassium: 4.2 mmol/L (ref 3.5–5.1)
Sodium: 141 mmol/L (ref 135–145)

## 2019-03-03 LAB — CBC
HCT: 48.2 % (ref 39.0–52.0)
Hemoglobin: 15.7 g/dL (ref 13.0–17.0)
MCH: 30 pg (ref 26.0–34.0)
MCHC: 32.6 g/dL (ref 30.0–36.0)
MCV: 92 fL (ref 80.0–100.0)
Platelets: 153 10*3/uL (ref 150–400)
RBC: 5.24 MIL/uL (ref 4.22–5.81)
RDW: 13.5 % (ref 11.5–15.5)
WBC: 6 10*3/uL (ref 4.0–10.5)
nRBC: 0 % (ref 0.0–0.2)

## 2019-03-03 LAB — PROTIME-INR
INR: 1.9 — ABNORMAL HIGH (ref 0.8–1.2)
Prothrombin Time: 22 seconds — ABNORMAL HIGH (ref 11.4–15.2)

## 2019-03-03 LAB — GLUCOSE, CAPILLARY
Glucose-Capillary: 119 mg/dL — ABNORMAL HIGH (ref 70–99)
Glucose-Capillary: 105 mg/dL — ABNORMAL HIGH (ref 70–99)

## 2019-03-03 SURGERY — AMPUTATION DIGIT
Anesthesia: General | Laterality: Right

## 2019-03-03 MED ORDER — CHLORHEXIDINE GLUCONATE 4 % EX LIQD: 60.0000 mL | Freq: Once | CUTANEOUS | Status: DC

## 2019-03-03 MED ORDER — FENTANYL CITRATE (PF) 250 MCG/5ML IJ SOLN
INTRAMUSCULAR | Status: AC
  Filled 2019-03-03: qty 5

## 2019-03-03 MED ORDER — CLINDAMYCIN PHOSPHATE 900 MG/50ML IV SOLN
INTRAVENOUS | Status: AC
  Filled 2019-03-03: qty 50

## 2019-03-03 MED ORDER — LIDOCAINE 2% (20 MG/ML) 5 ML SYRINGE
INTRAMUSCULAR | Status: DC | PRN
  Administered 2019-03-03: 80 mg via INTRAVENOUS

## 2019-03-03 MED ORDER — EPHEDRINE SULFATE 50 MG/ML IJ SOLN
INTRAMUSCULAR | Status: DC | PRN
  Administered 2019-03-03: 10 mg via INTRAVENOUS

## 2019-03-03 MED ORDER — FENTANYL CITRATE (PF) 100 MCG/2ML IJ SOLN
INTRAMUSCULAR | Status: DC | PRN
  Administered 2019-03-03: 25 ug via INTRAVENOUS

## 2019-03-03 MED ORDER — PHENYLEPHRINE 40 MCG/ML (10ML) SYRINGE FOR IV PUSH (FOR BLOOD PRESSURE SUPPORT)
PREFILLED_SYRINGE | INTRAVENOUS | Status: DC | PRN
  Administered 2019-03-03: 40 ug via INTRAVENOUS
  Administered 2019-03-03 (×3): 80 ug via INTRAVENOUS

## 2019-03-03 MED ORDER — 0.9 % SODIUM CHLORIDE (POUR BTL) OPTIME
TOPICAL | Status: DC | PRN
  Administered 2019-03-03: 1000 mL

## 2019-03-03 MED ORDER — ONDANSETRON HCL 4 MG/2ML IJ SOLN
INTRAMUSCULAR | Status: DC | PRN
  Administered 2019-03-03: 4 mg via INTRAVENOUS

## 2019-03-03 MED ORDER — HYDROCODONE-ACETAMINOPHEN 5-325 MG PO TABS: 1.0000 | ORAL_TABLET | ORAL | 0 refills | Status: DC | PRN

## 2019-03-03 MED ORDER — CLINDAMYCIN PHOSPHATE 900 MG/50ML IV SOLN
900.0000 mg | INTRAVENOUS | Status: AC
  Administered 2019-03-03: 900 mg via INTRAVENOUS

## 2019-03-03 MED ORDER — PROPOFOL 10 MG/ML IV BOLUS
INTRAVENOUS | Status: AC
  Filled 2019-03-03: qty 80

## 2019-03-03 MED ORDER — SODIUM CHLORIDE 0.9 % IV SOLN: INTRAVENOUS | Status: DC | PRN

## 2019-03-03 MED ORDER — PROPOFOL 10 MG/ML IV BOLUS
INTRAVENOUS | Status: DC | PRN
  Administered 2019-03-03: 100 mg via INTRAVENOUS

## 2019-03-03 MED ORDER — ONDANSETRON HCL 4 MG/2ML IJ SOLN
INTRAMUSCULAR | Status: AC
  Filled 2019-03-03: qty 2

## 2019-03-03 SURGICAL SUPPLY — 31 items
BLADE SURG 21 STRL SS (BLADE) ×2 IMPLANT
BNDG COHESIVE 4X5 TAN STRL (GAUZE/BANDAGES/DRESSINGS) ×2 IMPLANT
BNDG ESMARK 4X9 LF (GAUZE/BANDAGES/DRESSINGS) IMPLANT
BNDG GAUZE ELAST 4 BULKY (GAUZE/BANDAGES/DRESSINGS) ×2 IMPLANT
COVER SURGICAL LIGHT HANDLE (MISCELLANEOUS) ×3 IMPLANT
COVER WAND RF STERILE (DRAPES) ×2 IMPLANT
DRAPE U-SHAPE 47X51 STRL (DRAPES) ×2 IMPLANT
DRSG ADAPTIC 3X8 NADH LF (GAUZE/BANDAGES/DRESSINGS) IMPLANT
DRSG EMULSION OIL 3X3 NADH (GAUZE/BANDAGES/DRESSINGS) ×1 IMPLANT
DRSG PAD ABDOMINAL 8X10 ST (GAUZE/BANDAGES/DRESSINGS) ×2 IMPLANT
DURAPREP 26ML APPLICATOR (WOUND CARE) ×2 IMPLANT
ELECT REM PT RETURN 9FT ADLT (ELECTROSURGICAL) ×2
ELECTRODE REM PT RTRN 9FT ADLT (ELECTROSURGICAL) ×1 IMPLANT
GAUZE SPONGE 4X4 12PLY STRL (GAUZE/BANDAGES/DRESSINGS) IMPLANT
GAUZE SPONGE 4X4 16PLY XRAY LF (GAUZE/BANDAGES/DRESSINGS) ×1 IMPLANT
GLOVE BIOGEL PI IND STRL 9 (GLOVE) ×1 IMPLANT
GLOVE BIOGEL PI INDICATOR 9 (GLOVE) ×1
GLOVE SURG ORTHO 9.0 STRL STRW (GLOVE) ×2 IMPLANT
GOWN STRL REUS W/ TWL XL LVL3 (GOWN DISPOSABLE) ×2 IMPLANT
GOWN STRL REUS W/TWL XL LVL3 (GOWN DISPOSABLE) ×2
KIT BASIN OR (CUSTOM PROCEDURE TRAY) ×2 IMPLANT
KIT TURNOVER KIT B (KITS) ×2 IMPLANT
MANIFOLD NEPTUNE II (INSTRUMENTS) ×2 IMPLANT
NEEDLE 22X1 1/2 (OR ONLY) (NEEDLE) IMPLANT
NS IRRIG 1000ML POUR BTL (IV SOLUTION) ×2 IMPLANT
PACK ORTHO EXTREMITY (CUSTOM PROCEDURE TRAY) ×2 IMPLANT
PAD ABD 8X10 STRL (GAUZE/BANDAGES/DRESSINGS) ×1 IMPLANT
PAD ARMBOARD 7.5X6 YLW CONV (MISCELLANEOUS) ×4 IMPLANT
SUT ETHILON 2 0 PSLX (SUTURE) ×2 IMPLANT
SYR CONTROL 10ML LL (SYRINGE) IMPLANT
TOWEL GREEN STERILE (TOWEL DISPOSABLE) ×2 IMPLANT

## 2019-03-03 NOTE — Transfer of Care (Signed)
Immediate Anesthesia Transfer of Care Note  Patient: [name]  Procedure(s) Performed: RIGHT 2ND TOE AMPUTATION (Right )  Patient Location: PACU  Anesthesia Type:General  Level of Consciousness: awake, alert , oriented and drowsy  Airway & Oxygen Therapy: Patient Spontanous Breathing and Patient connected to face mask  Post-op Assessment: Report given to RN, Post -op Vital signs reviewed and stable and Patient moving all extremities  Post vital signs: Reviewed and stable  Last Vitals:  Vitals Value Taken Time  BP 117/68 03/03/19 0808  Temp    Pulse 68 03/03/19 0809  Resp 15 03/03/19 0809  SpO2 99 % 03/03/19 0809  Vitals shown include unvalidated device data.  Last Pain:  Vitals:   03/03/19 0653  PainSc: 0-No pain         Complications: No apparent anesthesia complications

## 2019-03-03 NOTE — Op Note (Signed)
03/03/2019  8:08 AM  PATIENT:  [name]    PRE-OPERATIVE DIAGNOSIS:  Osteomyelitis Right 2nd Toe  POST-OPERATIVE DIAGNOSIS:  Same  PROCEDURE:  RIGHT 2ND TOE AMPUTATION  SURGEON:  [name], MD  PHYSICIAN ASSISTANT:None ANESTHESIA:   General  PREOPERATIVE INDICATIONS:  [name] is a  85 y.o. male with a diagnosis of Osteomyelitis Right 2nd Toe who failed conservative measures and elected for surgical management.    The risks benefits and alternatives were discussed with the patient preoperatively including but not limited to the risks of infection, bleeding, nerve injury, cardiopulmonary complications, the need for revision surgery, among others, and the patient was willing to proceed.  OPERATIVE IMPLANTS: none  @ENCIMAGES @  OPERATIVE FINDINGS: no abscess at level of amputation  OPERATIVE PROCEDURE: Patient brought the operating room and underwent a general anesthetic.  After adequate levels anesthesia were obtained patient's right lower extremity was prepped using DuraPrep draped into a sterile field a timeout was called.  AV incision was made around the second metatarsal.  The toe was amputated through the MTP joint.  There is no signs of infection at the MTP joint.  Electrocautery was used for hemostasis.  The wound was irrigated with normal saline incision was closed using 2-0 nylon sterile dressing was applied patient was extubated taken the PACU in stable condition.   DISCHARGE PLANNING:  Antibiotic duration:preop cleocin  Weightbearing: TDWB right  Pain medication: vicodin  Dressing care/ Wound KGM:WNUUVO dressing at follow up  Ambulatory devices:crutches  Discharge to: home  Follow-up: In the office 1 week post operative.

## 2019-03-03 NOTE — H&P (Signed)
[name] is an 85 y.o. male.   Chief Complaint: Right Toe osteomyelitis HPI: Patient is a 85 year old gentleman who was seen in follow-up for osteomyelitis and ulceration right foot second toe.  Patient has been to vascular vein surgery ankle-brachial indices shows adequate circulation.  Patient has completed his course of antibiotics.  Patient complains of redness swelling denies any drainage.  Patient was seen with his daughter on the phone  Past Medical History:  Diagnosis Date  . Bradycardia    a. nocturnal with pauses overnight on tele likely due to OSA 10/2015.  [name] Cancer ([place])    skin cancer  . CKD (chronic kidney disease), stage III   . Coronary artery disease    a. prev nonobst. b. LHC 10/24/15: occlusion of small diffusely diseased OM2 s/p balloon angioplasty, mild nonobstructive disease of mLAD and mRCA, normal LVEDP  . Diabetes mellitus without complication ([place])   . Diverticulosis   . Esophageal reflux   . Esophageal stricture from GERD 06/08/2011   With stricture at GE junction on EGD, dilated 06/2011   . Gastric ulcer    on EGD 2013  . Gout, unspecified    "on daily RX" (09/09/2017)  . History of blood transfusion 02/1964   "when I had right little finger cut off"  . Hyperlipidemia   . Hypertension   . Obesity, unspecified   . Orthopnea   . OSA on CPAP   . Paroxysmal nocturnal dyspnea   . Persistent atrial fibrillation ([place])   . Vertigo     Past Surgical History:  Procedure Laterality Date  . BALLOON DILATION N/A 10/22/2014   Procedure: BALLOON DILATION;  Surgeon: [name], MD;  [facility];  Service: Endoscopy;  Laterality: N/A;  . CARDIAC CATHETERIZATION  07/2008  . CARDIAC CATHETERIZATION N/A 10/24/2015   Procedure: Left Heart Cath and Coronary Angiography;  Surgeon: [name], MD;  Location: [facility];  Service: Cardiovascular;  Laterality: N/A;  . CARDIAC CATHETERIZATION N/A 10/24/2015   Procedure: Coronary Balloon Angioplasty;   Surgeon: [name], MD;  Location: [facility];  Service: Cardiovascular;  Laterality: N/A;  . Carotid Dopplers  09/2009   no sig extracranial stenosis and vertebral arteries had antegrade flow  . CATARACT EXTRACTION W/ INTRAOCULAR LENS  IMPLANT, BILATERAL Bilateral   . ESOPHAGOGASTRODUODENOSCOPY (EGD) WITH ESOPHAGEAL DILATION     "I've had it stretched 3 times" (10/23/2015)  . ESOPHAGOGASTRODUODENOSCOPY (EGD) WITH ESOPHAGEAL DILATION  06/2011   /medical hx above  (09/09/2017)  . ESOPHAGOGASTRODUODENOSCOPY (EGD) WITH PROPOFOL N/A 10/22/2014   Procedure: ESOPHAGOGASTRODUODENOSCOPY (EGD) WITH PROPOFOL;  Surgeon: [name], MD;  [facility];  Service: Endoscopy;  Laterality: N/A;  . FINGER SURGERY Right 02/1964   "cut little finger off; had it reattached"  . I & D EXTREMITY Right 09/10/2017   Procedure: RIGHT FOOT DEBRIDEMENT;  Surgeon: [name], MD;  Location: [facility];  Service: Orthopedics;  Laterality: Right;  . MRI of brain  09/2009   chronic microvascular ischemia  . OSA Sleep Study     per Dr. Brett Fairy  . SHOULDER ARTHROSCOPY W/ ROTATOR CUFF REPAIR Right 2008    Family History  Problem Relation Age of Onset  . Stomach cancer Mother        died in 29's   . Hypertension Father        died in his 44's pna likely dementia  . Dementia Father   . Pneumonia Father   . Heart  disease Brother        S/P CABG  . Hypertension Other   . Prostate cancer Neg Hx   . Colon cancer Neg Hx    Social History:  reports that he quit smoking about 49 years ago. His smoking use included cigarettes. He has a 0.48 pack-year smoking history. He has quit using smokeless tobacco. He reports previous alcohol use. He reports that he does not use drugs.  Allergies:  Allergies  Allergen Reactions  . Penicillins Shortness Of Breath and Swelling    Patient tolerated Rocephin injection 2019  PATIENT HAS HAD A PCN REACTION WITH IMMEDIATE RASH, FACIAL/TONGUE/THROAT SWELLING, SOB, OR  LIGHTHEADEDNESS WITH HYPOTENSION:  #  #  YES  #  Has patient had a PCN reaction causing severe rash involving mucus membranes or skin necrosis: NO Has patient had a PCN reaction that required hospitalization NO Has patient had a PCN reaction occurring within the last 10 years: NO If all of the above answers are "NO", then may proceed with Cephalosporin use.   . Atorvastatin Other (See Comments)    No energy, just felt bad    . Pravastatin Other (See Comments)    myalgias  . Hydrocodone Other (See Comments)    "swimmy headed" after use  . Uloric [Febuxostat] Other (See Comments)    Would avoid, worsening gout sx with use.   . Vancomycin Other (See Comments)    Red man syndrome- tolerated with slow run and benadryl use  . Biaxin [Clarithromycin] Diarrhea  . Doxycycline Other (See Comments)    GI upset    Medications Prior to Admission  Medication Sig Dispense Refill  . acetaminophen (TYLENOL) 500 MG tablet Take 1,000 mg by mouth as needed for mild pain.    [name] allopurinol (ZYLOPRIM) 100 MG tablet TAKE 1 TABLET BY MOUTH EVERY DAY (Patient taking differently: Take 100 mg by mouth daily. ) 90 tablet 3  . colchicine 0.6 MG tablet TAKE 1 TABLET BY MOUTH DAILY IF NEEDED-CAN TAKE 2ND TAB PER DAY IF GOUT FLARE. (Patient taking differently: Take 0.6 mg by mouth 2 (two) times daily as needed (gout flare). ) 60 tablet 1  . diclofenac Sodium (VOLTAREN) 1 % GEL Apply 1 application topically 4 (four) times daily as needed (pain).    [name] diltiazem (CARDIZEM CD) 180 MG 24 hr capsule TAKE 1 CAPSULE (180 MG TOTAL) BY MOUTH DAILY. 90 capsule 2  . furosemide (LASIX) 20 MG tablet TAKE 1 TABLET (20 MG TOTAL) BY MOUTH DAILY. 90 tablet 1  . lisinopril (ZESTRIL) 20 MG tablet TAKE 1 TABLET BY MOUTH DAILY. PLEASE KEEP UPCOMING APPT IN MARCH WITH DR. Burt Knack FOR FUTURE REFILLS (Patient taking differently: Take 20 mg by mouth daily. ) 90 tablet 3  . metoprolol tartrate (LOPRESSOR) 25 MG tablet Take 0.5 tablets (12.5 mg  total) by mouth 2 (two) times daily. 30 tablet 3  . nitroGLYCERIN (NITROSTAT) 0.4 MG SL tablet Place 1 tablet (0.4 mg total) under the tongue every 5 (five) minutes x 3 doses as needed for chest pain. 25 tablet 2  . omeprazole (PRILOSEC) 40 MG capsule TAKE 1 CAPSULE BY MOUTH EVERY DAY (Patient taking differently: Take 40 mg by mouth daily. ) 90 capsule 1  . warfarin (COUMADIN) 4 MG tablet TAKE AS DIRECTED BY COUMADIN CLINIC (Patient taking differently: Take 4-6 mg by mouth See admin instructions. Take 6 mg on Sun and Thur. Take 4 mg on Mon, Tue, Wed, Fri, Sat) 120 tablet 1  . loratadine (  CLARITIN) 10 MG tablet Take 10 mg by mouth daily as needed for allergies.    . mupirocin ointment (BACTROBAN) 2 % Apply 1 application topically daily as needed (wound care).     [name] sulfamethoxazole-trimethoprim (BACTRIM DS) 800-160 MG tablet Take 1 tablet by mouth 2 (two) times daily. (Patient not taking: Reported on 02/28/2019) 20 tablet 0    No results found for this or any previous visit (from the past 48 hour(s)). No results found.  Review of Systems  All other systems reviewed and are negative.   Blood pressure 139/65, pulse 77, temperature 98.1 F (36.7 C), resp. rate 19, height 5\' 6"  (1.676 m), weight 88.5 kg, SpO2 100 %. Physical Exam  Patient is alert, oriented, no adenopathy, well-dressed, normal affect, normal respiratory effort. Examination patient has venous swelling he has sausage digit swelling of the second toe.  He has a callus in the first webspace of the second toe PIP joint.  The callus was removed and patient has an ulcer that probes down to the PIP joint with exposed bone.  ABIs show adequate circulation. Assessment/Plan Visit Diagnoses:  1. Diabetic polyneuropathy associated with type 2 diabetes mellitus ([place])   2. Osteomyelitis of second toe of right foot ([place])     Plan: Patient and daughter state they would like to proceed with amputation of the second toe right foot discussed the  importance of minimizing weightbearing for the first 2 weeks.  Risks and benefits were discussed including risk of the wound not healing.  Patient and daughter state they understand and wish to proceed with surgery this week we will set it up for Friday as outpatient surgery.    Bevely Palmer Kennadi Albany, PA 03/03/2019, 7:06 AM

## 2019-03-03 NOTE — Telephone Encounter (Signed)
Pt's daughter Lawerance Bach called into clinic, pt had to have his toe amputated today, pt was instructed to hold Warfarin yesterday and today and was told to notify the Coumadin Clinic.  Advised daughter we needed to make a sooner appt to check his INR next week on Wednesday.  Daughter is concerned about getting him out of the house.  Pt has a f/u appt with Dr Sharol Given next week and she is going to call their office to see if they will check an INR at that [facility].  She states she will call back if they are unable to check it and schedule an appt. Will await call back.

## 2019-03-03 NOTE — Anesthesia Procedure Notes (Signed)
Procedure Name: LMA Insertion Date/Time: 03/03/2019 7:40 AM Performed by: Clearnce Sorrel, CRNA Pre-anesthesia Checklist: Patient identified, Emergency Drugs available, Suction available, Patient being monitored and Timeout performed Patient Re-evaluated:Patient Re-evaluated prior to induction Oxygen Delivery Method: Circle system utilized Preoxygenation: Pre-oxygenation with 100% oxygen Induction Type: IV induction LMA Size: 4.0 Number of attempts: 1 Placement Confirmation: positive ETCO2 and breath sounds checked- equal and bilateral Tube secured with: Tape Dental Injury: Teeth and Oropharynx as per pre-operative assessment

## 2019-03-03 NOTE — Telephone Encounter (Signed)
Patient's daughter called.  He had surgery performed on him today, December 18th 2020. Dr.Duda requested he come back for his follow up in one week but there was no availability to schedule him on my end. I gave him the next available but he will surely need to be seen before then.   Call back should be to his daughter, her number is: [phone]

## 2019-03-03 NOTE — Telephone Encounter (Signed)
Can you please call Junie Panning is the NP and Velta Addison is the PA for Dr. Sharol Given. They see his post op appt. He can come in on Monday 11/11/18 if you will please make appt. Thanks!

## 2019-03-03 NOTE — Progress Notes (Signed)
Orthopedic Tech Progress Note Patient Details:  [name] [birth year] [id number]  Ortho Devices Type of Ortho Device: Postop shoe/boot Ortho Device/Splint Interventions: Application   Post Interventions Patient Tolerated: Well   [place] Pink 03/03/2019, 8:47 AM

## 2019-03-03 NOTE — Telephone Encounter (Signed)
Pt's daughter returned call to clinic, pt scheduled for INR check on 12/23 since podiatry f/u not scheduled until 1/4.  Daughter stated she would try to move up podiatry appt to 12/23 and see if they could check pt's INR. She will let us know if Dr Jess Barters office can do this.

## 2019-03-03 NOTE — Anesthesia Postprocedure Evaluation (Signed)
Anesthesia Post Note  Patient: [name]  Procedure(s) Performed: RIGHT 2ND TOE AMPUTATION (Right )     Patient location during evaluation: PACU Anesthesia Type: General Level of consciousness: awake and alert Pain management: pain level controlled Vital Signs Assessment: post-procedure vital signs reviewed and stable Respiratory status: spontaneous breathing, nonlabored ventilation and respiratory function stable Cardiovascular status: blood pressure returned to baseline and stable Postop Assessment: no apparent nausea or vomiting Anesthetic complications: no    Last Vitals:  Vitals:   03/03/19 0833 03/03/19 0834  BP: 109/62   Pulse: 77 71  Resp: 12 15  Temp: 36.4 C   SpO2: 93% 94%    Last Pain:  Vitals:   03/03/19 0830  PainSc: 0-No pain                 Audry Pili

## 2019-03-04 ENCOUNTER — Other Ambulatory Visit: Payer: Self-pay | Admitting: Cardiovascular Disease

## 2019-03-08 ENCOUNTER — Ambulatory Visit (INDEPENDENT_AMBULATORY_CARE_PROVIDER_SITE_OTHER): Payer: Medicare Other | Admitting: *Deleted

## 2019-03-08 ENCOUNTER — Other Ambulatory Visit: Payer: Self-pay

## 2019-03-08 DIAGNOSIS — Z5181 Encounter for therapeutic drug level monitoring: Secondary | ICD-10-CM

## 2019-03-08 DIAGNOSIS — I4819 Other persistent atrial fibrillation: Secondary | ICD-10-CM | POA: Diagnosis not present

## 2019-03-08 LAB — POCT INR: INR: 1.6 — AB (ref 2.0–3.0)

## 2019-03-08 NOTE — Patient Instructions (Signed)
Description   Today take an extra 1/2 tablet today then continue on same dosage 1 tablet daily except 1.5 tablets each Sundays and Thursdays. Recheck INR in 2 weeks. Coumadin Clinic [phone]

## 2019-03-13 ENCOUNTER — Ambulatory Visit (INDEPENDENT_AMBULATORY_CARE_PROVIDER_SITE_OTHER): Payer: Medicare Other | Admitting: Physician Assistant

## 2019-03-13 ENCOUNTER — Encounter: Payer: Self-pay | Admitting: Physician Assistant

## 2019-03-13 ENCOUNTER — Other Ambulatory Visit: Payer: Self-pay

## 2019-03-13 VITALS — Ht 66.0 in | Wt 195.0 lb

## 2019-03-13 DIAGNOSIS — M869 Osteomyelitis, unspecified: Secondary | ICD-10-CM

## 2019-03-13 NOTE — Progress Notes (Signed)
Office Visit Note   Patient: [name]           Date of Birth: Apr 21, 1930           MRN: [id number] Visit Date: 03/13/2019              Requested by: Tonia Ghent, MD [address] [place],  [place] [address] PCP: Tonia Ghent, MD  Chief Complaint  Patient presents with  . Right Foot - Routine Post Op    03/03/2019 right 2nd toe amp      HPI: This is a pleasant gentleman who is 12 days status post right second toe amputation he is doing very well and has been refraining from weightbearing in this area he has no complaints  Assessment & Plan: Visit Diagnoses: No diagnosis found.  Plan: He is doing well and I think when he follows up in 1 week the sutures could be harvested  Follow-Up Instructions: No follow-ups on file.   Ortho Exam  Patient is alert, oriented, no adenopathy, well-dressed, normal affect, normal respiratory effort. Focused examination demonstrates healing surgical incision.  Swelling is well controlled.  There is no drainage there is no cellulitis there is no foul odor  Imaging: No results found. No images are attached to the encounter.  Labs: Lab Results  Component Value Date   HGBA1C 6.7 (A) 12/23/2018   HGBA1C 6.7 (H) 11/04/2017   HGBA1C  07/31/2008    6.1 (NOTE) The ADA recommends the following therapeutic goal for glycemic control related to Hgb A1c measurement: Goal of therapy: <6.5 Hgb A1c  Reference: American Diabetes Association: Clinical Practice Recommendations 2010, Diabetes Care, 2010, 33: (Suppl  1).   ESRSEDRATE 17 (H) 09/09/2017   CRP 1.1 (H) 09/09/2017   LABURIC 6.6 12/03/2017   LABURIC 9.3 (H) 11/04/2017   LABURIC 9.0 (H) 09/09/2017   REPTSTATUS 09/19/2017 FINAL 09/14/2017   GRAMSTAIN  09/10/2017    MODERATE WBC PRESENT,BOTH PMN AND MONONUCLEAR NO ORGANISMS SEEN    CULT  09/14/2017    NO GROWTH 5 DAYS Performed at [facility], [place] [address], [place], [place] [address]      Lab Results    Component Value Date   ALBUMIN 3.2 (L) 09/14/2017   ALBUMIN 4.3 09/09/2017   ALBUMIN 4.4 02/13/2016   LABURIC 6.6 12/03/2017   LABURIC 9.3 (H) 11/04/2017   LABURIC 9.0 (H) 09/09/2017    Lab Results  Component Value Date   MG 2.2 09/09/2017   No results found for: VD25OH  No results found for: PREALBUMIN CBC EXTENDED Latest Ref Rng & Units 03/03/2019 07/09/2018 12/03/2017  WBC 4.0 - 10.5 K/uL 6.0 7.7 8.4  RBC 4.22 - 5.81 MIL/uL 5.24 5.35 5.32  HGB 13.0 - 17.0 g/dL 15.7 15.4 15.7  HCT 39.0 - 52.0 % 48.2 47.3 46.1  PLT 150 - 400 K/uL 153 154 143.0 Result may be falsely decreased due to platelet clumping.(L)  NEUTROABS 1.7 - 7.7 K/uL - 5.5 5.2  LYMPHSABS 0.7 - 4.0 K/uL - 1.3 1.6     Body mass index is 31.47 kg/m.  Orders:  No orders of the defined types were placed in this encounter.  No orders of the defined types were placed in this encounter.    Procedures: No procedures performed  Clinical Data: No additional findings.  ROS:  All other systems negative, except as noted in the HPI. Review of Systems  Objective: Vital Signs: Ht 5\' 6"  (1.676 m)  Wt 195 lb (88.5 kg)   BMI 31.47 kg/m   Specialty Comments:  No specialty comments available.  PMFS History: Patient Active Problem List   Diagnosis Date Noted  . Osteomyelitis of second toe of right foot ([place])   . Acute non-recurrent maxillary sinusitis 05/25/2018  . Achilles tendinitis 12/05/2017  . Idiopathic chronic gout, unspecified site, without tophus (tophi) 10/06/2017  . Cutaneous abscess of right foot   . Atypical angina ([place]) 11/08/2015  . Acute diastolic heart failure ([place])   . CAD S/P percutaneous coronary angioplasty   . Chronic atrial fibrillation ([place])   . CKD (chronic kidney disease), stage III   . Persistent atrial fibrillation ([place])   . Hypertension   . Hyperlipidemia   . Bradycardia   . Back pain 05/24/2015  . Triceps tendonitis 05/24/2015  . Diabetes mellitus without complication  ([place]) 75/64/3329  . Dysphagia, pharyngoesophageal phase 09/25/2014  . History of esophageal stricture 09/25/2014  . Chronic anticoagulation 09/25/2014  . Helicobacter pylori duodenitis 09/25/2014  . Cough 03/14/2014  . Benign paroxysmal positional vertigo 03/14/2014  . Encounter for therapeutic drug monitoring 04/12/2013  . Esophageal stricture from GERD 06/08/2011  . Advance care planning 04/20/2011  . OSA on CPAP 09/22/2010  . HYPERCHOLESTEROLEMIA 08/15/2008  . Gout 08/15/2008  . OBESITY 08/15/2008  . Essential hypertension 08/15/2008  . CAD (coronary artery disease), native coronary artery 08/15/2008  . GASTROESOPHAGEAL REFLUX DISEASE 08/15/2008   Past Medical History:  Diagnosis Date  . Bradycardia    a. nocturnal with pauses overnight on tele likely due to OSA 10/2015.  [name] Cancer ([place])    skin cancer  . CKD (chronic kidney disease), stage III   . Coronary artery disease    a. prev nonobst. b. LHC 10/24/15: occlusion of small diffusely diseased OM2 s/p balloon angioplasty, mild nonobstructive disease of mLAD and mRCA, normal LVEDP  . Diabetes mellitus without complication ([place])   . Diverticulosis   . Esophageal reflux   . Esophageal stricture from GERD 06/08/2011   With stricture at GE junction on EGD, dilated 06/2011   . Gastric ulcer    on EGD 2013  . Gout, unspecified    "on daily RX" (09/09/2017)  . History of blood transfusion 02/1964   "when I had right little finger cut off"  . Hyperlipidemia   . Hypertension   . Obesity, unspecified   . Orthopnea   . OSA on CPAP   . Paroxysmal nocturnal dyspnea   . Persistent atrial fibrillation ([place])   . Vertigo     Family History  Problem Relation Age of Onset  . Stomach cancer Mother        died in 79's   . Hypertension Father        died in his 6's pna likely dementia  . Dementia Father   . Pneumonia Father   . Heart disease Brother        S/P CABG  . Hypertension Other   . Prostate cancer Neg Hx   . Colon cancer  Neg Hx     Past Surgical History:  Procedure Laterality Date  . AMPUTATION Right 03/03/2019   Procedure: RIGHT 2ND TOE AMPUTATION;  Surgeon: [name], MD;  Location: [facility];  Service: Orthopedics;  Laterality: Right;  . BALLOON DILATION N/A 10/22/2014   Procedure: BALLOON DILATION;  Surgeon: [name], MD;  [facility];  Service: Endoscopy;  Laterality: N/A;  . CARDIAC CATHETERIZATION  07/2008  . CARDIAC CATHETERIZATION N/A 10/24/2015  Procedure: Left Heart Cath and Coronary Angiography;  Surgeon: [name], MD;  Location: [facility];  Service: Cardiovascular;  Laterality: N/A;  . CARDIAC CATHETERIZATION N/A 10/24/2015   Procedure: Coronary Balloon Angioplasty;  Surgeon: [name], MD;  Location: [facility];  Service: Cardiovascular;  Laterality: N/A;  . Carotid Dopplers  09/2009   no sig extracranial stenosis and vertebral arteries had antegrade flow  . CATARACT EXTRACTION W/ INTRAOCULAR LENS  IMPLANT, BILATERAL Bilateral   . ESOPHAGOGASTRODUODENOSCOPY (EGD) WITH ESOPHAGEAL DILATION     "I've had it stretched 3 times" (10/23/2015)  . ESOPHAGOGASTRODUODENOSCOPY (EGD) WITH ESOPHAGEAL DILATION  06/2011   /medical hx above  (09/09/2017)  . ESOPHAGOGASTRODUODENOSCOPY (EGD) WITH PROPOFOL N/A 10/22/2014   Procedure: ESOPHAGOGASTRODUODENOSCOPY (EGD) WITH PROPOFOL;  Surgeon: [name], MD;  [facility];  Service: Endoscopy;  Laterality: N/A;  . FINGER SURGERY Right 02/1964   "cut little finger off; had it reattached"  . I & D EXTREMITY Right 09/10/2017   Procedure: RIGHT FOOT DEBRIDEMENT;  Surgeon: [name], MD;  Location: [facility];  Service: Orthopedics;  Laterality: Right;  . MRI of brain  09/2009   chronic microvascular ischemia  . OSA Sleep Study     per Dr. Brett Fairy  . SHOULDER ARTHROSCOPY W/ ROTATOR CUFF REPAIR Right 2008   Social History   Occupational History  . 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 Smoking status: Former Smoker    Packs/day: 0.12    Years: 4.00    Pack years: 0.48    Types: Cigarettes    Quit date: 03/16/1969    Years since quitting: 50.0  . Smokeless tobacco: Former Systems developer  . Tobacco comment: "chewed when I smoked; smoked 1 pack/week for about 4 years, quit 1971  Substance and Sexual Activity  . Alcohol use: Not Currently    Alcohol/week: 0.0 standard drinks    Comment: 09/09/2017  "Quit all alcohol in 1970's"  . Drug use: Never  . Sexual activity: Not on file

## 2019-03-20 ENCOUNTER — Ambulatory Visit (INDEPENDENT_AMBULATORY_CARE_PROVIDER_SITE_OTHER): Payer: Medicare Other | Admitting: Physician Assistant

## 2019-03-20 ENCOUNTER — Other Ambulatory Visit: Payer: Self-pay

## 2019-03-20 ENCOUNTER — Encounter: Payer: Self-pay | Admitting: Physician Assistant

## 2019-03-20 ENCOUNTER — Ambulatory Visit: Payer: Medicare Other | Admitting: Orthopedic Surgery

## 2019-03-20 VITALS — Ht 66.0 in | Wt 195.0 lb

## 2019-03-20 DIAGNOSIS — M869 Osteomyelitis, unspecified: Secondary | ICD-10-CM

## 2019-03-20 NOTE — Progress Notes (Signed)
Office Visit Note   Patient: [name]           Date of Birth: 03-Apr-1930           MRN: [id number] Visit Date: 03/20/2019              Requested by: Tonia Ghent, MD [address] [place],  [place] [address] PCP: Tonia Ghent, MD  Chief Complaint  Patient presents with  . Right Foot - Routine Post Op    03/03/19 right foot 2nd toe amputation       HPI: This is a pleasant 86 year old gentleman who is a little over 2 weeks status post right second toe amputation he is doing well and has no complaints  Assessment & Plan: Visit Diagnoses: No diagnosis found.  Plan: He will protect his weightbearing for 1 more week and I anticipate suture removal at his next visit  Follow-Up Instructions: No follow-ups on file.   Ortho Exam  Patient is alert, oriented, no adenopathy, well-dressed, normal affect, normal respiratory effort. Amputation site is healing well well approximated wound edges with no necrosis swelling is well controlled no surrounding cellulitis  Imaging: No results found. No images are attached to the encounter.  Labs: Lab Results  Component Value Date   HGBA1C 6.7 (A) 12/23/2018   HGBA1C 6.7 (H) 11/04/2017   HGBA1C  07/31/2008    6.1 (NOTE) The ADA recommends the following therapeutic goal for glycemic control related to Hgb A1c measurement: Goal of therapy: <6.5 Hgb A1c  Reference: American Diabetes Association: Clinical Practice Recommendations 2010, Diabetes Care, 2010, 33: (Suppl  1).   ESRSEDRATE 17 (H) 09/09/2017   CRP 1.1 (H) 09/09/2017   LABURIC 6.6 12/03/2017   LABURIC 9.3 (H) 11/04/2017   LABURIC 9.0 (H) 09/09/2017   REPTSTATUS 09/19/2017 FINAL 09/14/2017   GRAMSTAIN  09/10/2017    MODERATE WBC PRESENT,BOTH PMN AND MONONUCLEAR NO ORGANISMS SEEN    CULT  09/14/2017    NO GROWTH 5 DAYS Performed at [facility], [place] [address] [place], [place] [address]      Lab Results  Component Value Date   ALBUMIN 3.2 (L)  09/14/2017   ALBUMIN 4.3 09/09/2017   ALBUMIN 4.4 02/13/2016   LABURIC 6.6 12/03/2017   LABURIC 9.3 (H) 11/04/2017   LABURIC 9.0 (H) 09/09/2017    Lab Results  Component Value Date   MG 2.2 09/09/2017   No results found for: VD25OH  No results found for: PREALBUMIN CBC EXTENDED Latest Ref Rng & Units 03/03/2019 07/09/2018 12/03/2017  WBC 4.0 - 10.5 K/uL 6.0 7.7 8.4  RBC 4.22 - 5.81 MIL/uL 5.24 5.35 5.32  HGB 13.0 - 17.0 g/dL 15.7 15.4 15.7  HCT 39.0 - 52.0 % 48.2 47.3 46.1  PLT 150 - 400 K/uL 153 154 143.0 Result may be falsely decreased due to platelet clumping.(L)  NEUTROABS 1.7 - 7.7 K/uL - 5.5 5.2  LYMPHSABS 0.7 - 4.0 K/uL - 1.3 1.6     Body mass index is 31.47 kg/m.  Orders:  No orders of the defined types were placed in this encounter.  No orders of the defined types were placed in this encounter.    Procedures: No procedures performed  Clinical Data: No additional findings.  ROS:  All other systems negative, except as noted in the HPI. Review of Systems  Objective: Vital Signs: Ht 5\' 6"  (1.676 m)   Wt 195 lb (88.5 kg)   BMI 31.47 kg/m  Specialty Comments:  No specialty comments available.  PMFS History: Patient Active Problem List   Diagnosis Date Noted  . Osteomyelitis of second toe of right foot ([place])   . Acute non-recurrent maxillary sinusitis 05/25/2018  . Achilles tendinitis 12/05/2017  . Idiopathic chronic gout, unspecified site, without tophus (tophi) 10/06/2017  . Cutaneous abscess of right foot   . Atypical angina ([place]) 11/08/2015  . Acute diastolic heart failure ([place])   . CAD S/P percutaneous coronary angioplasty   . Chronic atrial fibrillation ([place])   . CKD (chronic kidney disease), stage III   . Persistent atrial fibrillation ([place])   . Hypertension   . Hyperlipidemia   . Bradycardia   . Back pain 05/24/2015  . Triceps tendonitis 05/24/2015  . Diabetes mellitus without complication ([place]) 30/09/6224  . Dysphagia,  pharyngoesophageal phase 09/25/2014  . History of esophageal stricture 09/25/2014  . Chronic anticoagulation 09/25/2014  . Helicobacter pylori duodenitis 09/25/2014  . Cough 03/14/2014  . Benign paroxysmal positional vertigo 03/14/2014  . Encounter for therapeutic drug monitoring 04/12/2013  . Esophageal stricture from GERD 06/08/2011  . Advance care planning 04/20/2011  . OSA on CPAP 09/22/2010  . HYPERCHOLESTEROLEMIA 08/15/2008  . Gout 08/15/2008  . OBESITY 08/15/2008  . Essential hypertension 08/15/2008  . CAD (coronary artery disease), native coronary artery 08/15/2008  . GASTROESOPHAGEAL REFLUX DISEASE 08/15/2008   Past Medical History:  Diagnosis Date  . Bradycardia    a. nocturnal with pauses overnight on tele likely due to OSA 10/2015.  [name] Cancer ([place])    skin cancer  . CKD (chronic kidney disease), stage III   . Coronary artery disease    a. prev nonobst. b. LHC 10/24/15: occlusion of small diffusely diseased OM2 s/p balloon angioplasty, mild nonobstructive disease of mLAD and mRCA, normal LVEDP  . Diabetes mellitus without complication ([place])   . Diverticulosis   . Esophageal reflux   . Esophageal stricture from GERD 06/08/2011   With stricture at GE junction on EGD, dilated 06/2011   . Gastric ulcer    on EGD 2013  . Gout, unspecified    "on daily RX" (09/09/2017)  . History of blood transfusion 02/1964   "when I had right little finger cut off"  . Hyperlipidemia   . Hypertension   . Obesity, unspecified   . Orthopnea   . OSA on CPAP   . Paroxysmal nocturnal dyspnea   . Persistent atrial fibrillation ([place])   . Vertigo     Family History  Problem Relation Age of Onset  . Stomach cancer Mother        died in 30's   . Hypertension Father        died in his 23's pna likely dementia  . Dementia Father   . Pneumonia Father   . Heart disease Brother        S/P CABG  . Hypertension Other   . Prostate cancer Neg Hx   . Colon cancer Neg Hx     Past Surgical  History:  Procedure Laterality Date  . AMPUTATION Right 03/03/2019   Procedure: RIGHT 2ND TOE AMPUTATION;  Surgeon: [name], MD;  Location: [facility];  Service: Orthopedics;  Laterality: Right;  . BALLOON DILATION N/A 10/22/2014   Procedure: BALLOON DILATION;  Surgeon: [name], MD;  [facility];  Service: Endoscopy;  Laterality: N/A;  . CARDIAC CATHETERIZATION  07/2008  . CARDIAC CATHETERIZATION N/A 10/24/2015   Procedure: Left Heart Cath and Coronary Angiography;  Surgeon: Harrell Gave  End, MD;  Location: [facility];  Service: Cardiovascular;  Laterality: N/A;  . CARDIAC CATHETERIZATION N/A 10/24/2015   Procedure: Coronary Balloon Angioplasty;  Surgeon: [name], MD;  Location: [facility];  Service: Cardiovascular;  Laterality: N/A;  . Carotid Dopplers  09/2009   no sig extracranial stenosis and vertebral arteries had antegrade flow  . CATARACT EXTRACTION W/ INTRAOCULAR LENS  IMPLANT, BILATERAL Bilateral   . ESOPHAGOGASTRODUODENOSCOPY (EGD) WITH ESOPHAGEAL DILATION     "I've had it stretched 3 times" (10/23/2015)  . ESOPHAGOGASTRODUODENOSCOPY (EGD) WITH ESOPHAGEAL DILATION  06/2011   /medical hx above  (09/09/2017)  . ESOPHAGOGASTRODUODENOSCOPY (EGD) WITH PROPOFOL N/A 10/22/2014   Procedure: ESOPHAGOGASTRODUODENOSCOPY (EGD) WITH PROPOFOL;  Surgeon: [name], MD;  [facility];  Service: Endoscopy;  Laterality: N/A;  . FINGER SURGERY Right 02/1964   "cut little finger off; had it reattached"  . I & D EXTREMITY Right 09/10/2017   Procedure: RIGHT FOOT DEBRIDEMENT;  Surgeon: [name], MD;  Location: [facility];  Service: Orthopedics;  Laterality: Right;  . MRI of brain  09/2009   chronic microvascular ischemia  . OSA Sleep Study     per Dr. Brett Fairy  . SHOULDER ARTHROSCOPY W/ ROTATOR CUFF REPAIR Right 2008   Social History   Occupational History  . 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  Smoking status: Former Smoker    Packs/day: 0.12    Years: 4.00    Pack years: 0.48    Types: Cigarettes    Quit date: 03/16/1969    Years since quitting: 50.0  . Smokeless tobacco: Former Systems developer  . Tobacco comment: "chewed when I smoked; smoked 1 pack/week for about 4 years, quit 1971  Substance and Sexual Activity  . Alcohol use: Not Currently    Alcohol/week: 0.0 standard drinks    Comment: 09/09/2017  "Quit all alcohol in 1970's"  . Drug use: Never  . Sexual activity: Not on file

## 2019-03-23 ENCOUNTER — Other Ambulatory Visit: Payer: Self-pay

## 2019-03-23 ENCOUNTER — Ambulatory Visit (INDEPENDENT_AMBULATORY_CARE_PROVIDER_SITE_OTHER): Payer: Medicare Other | Admitting: *Deleted

## 2019-03-23 DIAGNOSIS — I4819 Other persistent atrial fibrillation: Secondary | ICD-10-CM | POA: Diagnosis not present

## 2019-03-23 DIAGNOSIS — Z5181 Encounter for therapeutic drug level monitoring: Secondary | ICD-10-CM | POA: Diagnosis not present

## 2019-03-23 LAB — POCT INR: INR: 2.2 (ref 2.0–3.0)

## 2019-03-23 NOTE — Patient Instructions (Signed)
Description   Continue on same dosage 1 tablet daily except 1.5 tablets each Sundays and Thursdays. Recheck INR in 4 weeks. [facility] [phone]

## 2019-03-27 ENCOUNTER — Ambulatory Visit (INDEPENDENT_AMBULATORY_CARE_PROVIDER_SITE_OTHER): Payer: Medicare Other | Admitting: Orthopedic Surgery

## 2019-03-27 ENCOUNTER — Other Ambulatory Visit: Payer: Self-pay

## 2019-03-27 ENCOUNTER — Encounter: Payer: Self-pay | Admitting: Orthopedic Surgery

## 2019-03-27 VITALS — Ht 66.0 in | Wt 195.0 lb

## 2019-03-27 DIAGNOSIS — E1142 Type 2 diabetes mellitus with diabetic polyneuropathy: Secondary | ICD-10-CM

## 2019-03-27 DIAGNOSIS — M869 Osteomyelitis, unspecified: Secondary | ICD-10-CM

## 2019-03-27 NOTE — Progress Notes (Signed)
Office Visit Note   Patient: [name]           Date of Birth: October 31, 1930           MRN: [id number] Visit Date: 03/27/2019              Requested by: Tonia Ghent, MD [address] [place],  [place] [address] PCP: Tonia Ghent, MD  Chief Complaint  Patient presents with  . Right Foot - Routine Post Op    03/03/19 right foot 2nd toe amputation         HPI: Patient is a 86 year old gentleman who is seen in follow-up for right foot second toe amputation.  Patient states the incision is doing well he states that he did have some gout draining from the third toe.  He states he is currently taking allopurinol once a day and has just taken 3 days of colchicine.  Assessment & Plan: Visit Diagnoses:  1. Osteomyelitis of second toe of right foot ([place])   2. Diabetic polyneuropathy associated with type 2 diabetes mellitus ([place])     Plan: We will draw a uric acid level today discussed that we may need to increase his allopurinol to twice a day.  Sutures harvested today.  Follow-Up Instructions: Return in about 3 weeks (around 04/17/2019).   Ortho Exam  Patient is alert, oriented, no adenopathy, well-dressed, normal affect, normal respiratory effort. Examination the surgical incision is well-healed we will harvest the sutures.  There is no redness no cellulitis no drainage no signs of infection.  He does have some swelling of the third toe but there is no drainage from the toe no tophaceous deposits.  Patient will resume regular shoewear.  Imaging: No results found. No images are attached to the encounter.  Labs: Lab Results  Component Value Date   HGBA1C 6.7 (A) 12/23/2018   HGBA1C 6.7 (H) 11/04/2017   HGBA1C  07/31/2008    6.1 (NOTE) The ADA recommends the following therapeutic goal for glycemic control related to Hgb A1c measurement: Goal of therapy: <6.5 Hgb A1c  Reference: American Diabetes Association: Clinical Practice Recommendations 2010, Diabetes  Care, 2010, 33: (Suppl  1).   ESRSEDRATE 17 (H) 09/09/2017   CRP 1.1 (H) 09/09/2017   LABURIC 6.6 12/03/2017   LABURIC 9.3 (H) 11/04/2017   LABURIC 9.0 (H) 09/09/2017   REPTSTATUS 09/19/2017 FINAL 09/14/2017   GRAMSTAIN  09/10/2017    MODERATE WBC PRESENT,BOTH PMN AND MONONUCLEAR NO ORGANISMS SEEN    CULT  09/14/2017    NO GROWTH 5 DAYS Performed at [facility], [place] [address], [place], [place] [address]      Lab Results  Component Value Date   ALBUMIN 3.2 (L) 09/14/2017   ALBUMIN 4.3 09/09/2017   ALBUMIN 4.4 02/13/2016   LABURIC 6.6 12/03/2017   LABURIC 9.3 (H) 11/04/2017   LABURIC 9.0 (H) 09/09/2017    Lab Results  Component Value Date   MG 2.2 09/09/2017   No results found for: VD25OH  No results found for: PREALBUMIN CBC EXTENDED Latest Ref Rng & Units 03/03/2019 07/09/2018 12/03/2017  WBC 4.0 - 10.5 K/uL 6.0 7.7 8.4  RBC 4.22 - 5.81 MIL/uL 5.24 5.35 5.32  HGB 13.0 - 17.0 g/dL 15.7 15.4 15.7  HCT 39.0 - 52.0 % 48.2 47.3 46.1  PLT 150 - 400 K/uL 153 154 143.0 Result may be falsely decreased due to platelet clumping.(L)  NEUTROABS 1.7 - 7.7 K/uL - 5.5 5.2  LYMPHSABS 0.7 - 4.0 K/uL - 1.3 1.6     Body mass index is 31.47 kg/m.  Orders:  Orders Placed This Encounter  Procedures  . Uric acid   No orders of the defined types were placed in this encounter.    Procedures: No procedures performed  Clinical Data: No additional findings.  ROS:  All other systems negative, except as noted in the HPI. Review of Systems  Objective: Vital Signs: Ht 5\' 6"  (1.676 m)   Wt 195 lb (88.5 kg)   BMI 31.47 kg/m   Specialty Comments:  No specialty comments available.  PMFS History: Patient Active Problem List   Diagnosis Date Noted  . Osteomyelitis of second toe of right foot ([place])   . Acute non-recurrent maxillary sinusitis 05/25/2018  . Achilles tendinitis 12/05/2017  . Idiopathic chronic gout, unspecified site, without tophus (tophi) 10/06/2017    . Cutaneous abscess of right foot   . Atypical angina ([place]) 11/08/2015  . Acute diastolic heart failure ([place])   . CAD S/P percutaneous coronary angioplasty   . Chronic atrial fibrillation ([place])   . CKD (chronic kidney disease), stage III   . Persistent atrial fibrillation ([place])   . Hypertension   . Hyperlipidemia   . Bradycardia   . Back pain 05/24/2015  . Triceps tendonitis 05/24/2015  . Diabetes mellitus without complication ([place]) 62/70/3500  . Dysphagia, pharyngoesophageal phase 09/25/2014  . History of esophageal stricture 09/25/2014  . Chronic anticoagulation 09/25/2014  . Helicobacter pylori duodenitis 09/25/2014  . Cough 03/14/2014  . Benign paroxysmal positional vertigo 03/14/2014  . Encounter for therapeutic drug monitoring 04/12/2013  . Esophageal stricture from GERD 06/08/2011  . Advance care planning 04/20/2011  . OSA on CPAP 09/22/2010  . HYPERCHOLESTEROLEMIA 08/15/2008  . Gout 08/15/2008  . OBESITY 08/15/2008  . Essential hypertension 08/15/2008  . CAD (coronary artery disease), native coronary artery 08/15/2008  . GASTROESOPHAGEAL REFLUX DISEASE 08/15/2008   Past Medical History:  Diagnosis Date  . Bradycardia    a. nocturnal with pauses overnight on tele likely due to OSA 10/2015.  [name] Cancer ([place])    skin cancer  . CKD (chronic kidney disease), stage III   . Coronary artery disease    a. prev nonobst. b. LHC 10/24/15: occlusion of small diffusely diseased OM2 s/p balloon angioplasty, mild nonobstructive disease of mLAD and mRCA, normal LVEDP  . Diabetes mellitus without complication ([place])   . Diverticulosis   . Esophageal reflux   . Esophageal stricture from GERD 06/08/2011   With stricture at GE junction on EGD, dilated 06/2011   . Gastric ulcer    on EGD 2013  . Gout, unspecified    "on daily RX" (09/09/2017)  . History of blood transfusion 02/1964   "when I had right little finger cut off"  . Hyperlipidemia   . Hypertension   . Obesity, unspecified    . Orthopnea   . OSA on CPAP   . Paroxysmal nocturnal dyspnea   . Persistent atrial fibrillation ([place])   . Vertigo     Family History  Problem Relation Age of Onset  . Stomach cancer Mother        died in 9's   . Hypertension Father        died in his 87's pna likely dementia  . Dementia Father   . Pneumonia Father   . Heart disease Brother        S/P CABG  . Hypertension Other   . Prostate cancer Neg Hx   .  Colon cancer Neg Hx     Past Surgical History:  Procedure Laterality Date  . AMPUTATION Right 03/03/2019   Procedure: RIGHT 2ND TOE AMPUTATION;  Surgeon: [name], MD;  Location: [facility];  Service: Orthopedics;  Laterality: Right;  . BALLOON DILATION N/A 10/22/2014   Procedure: BALLOON DILATION;  Surgeon: [name], MD;  [facility];  Service: Endoscopy;  Laterality: N/A;  . CARDIAC CATHETERIZATION  07/2008  . CARDIAC CATHETERIZATION N/A 10/24/2015   Procedure: Left Heart Cath and Coronary Angiography;  Surgeon: [name], MD;  Location: [facility];  Service: Cardiovascular;  Laterality: N/A;  . CARDIAC CATHETERIZATION N/A 10/24/2015   Procedure: Coronary Balloon Angioplasty;  Surgeon: [name], MD;  Location: [facility];  Service: Cardiovascular;  Laterality: N/A;  . Carotid Dopplers  09/2009   no sig extracranial stenosis and vertebral arteries had antegrade flow  . CATARACT EXTRACTION W/ INTRAOCULAR LENS  IMPLANT, BILATERAL Bilateral   . ESOPHAGOGASTRODUODENOSCOPY (EGD) WITH ESOPHAGEAL DILATION     "I've had it stretched 3 times" (10/23/2015)  . ESOPHAGOGASTRODUODENOSCOPY (EGD) WITH ESOPHAGEAL DILATION  06/2011   /medical hx above  (09/09/2017)  . ESOPHAGOGASTRODUODENOSCOPY (EGD) WITH PROPOFOL N/A 10/22/2014   Procedure: ESOPHAGOGASTRODUODENOSCOPY (EGD) WITH PROPOFOL;  Surgeon: [name], MD;  [facility];  Service: Endoscopy;  Laterality: N/A;  . FINGER SURGERY Right 02/1964   "cut little finger off; had it  reattached"  . I & D EXTREMITY Right 09/10/2017   Procedure: RIGHT FOOT DEBRIDEMENT;  Surgeon: [name], MD;  Location: [facility];  Service: Orthopedics;  Laterality: Right;  . MRI of brain  09/2009   chronic microvascular ischemia  . OSA Sleep Study     per Dr. Brett Fairy  . SHOULDER ARTHROSCOPY W/ ROTATOR CUFF REPAIR Right 2008   Social History   Occupational History  . 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 Smoking status: Former Smoker    Packs/day: 0.12    Years: 4.00    Pack years: 0.48    Types: Cigarettes    Quit date: 03/16/1969    Years since quitting: 50.0  . Smokeless tobacco: Former Systems developer  . Tobacco comment: "chewed when I smoked; smoked 1 pack/week for about 4 years, quit 1971  Substance and Sexual Activity  . Alcohol use: Not Currently    Alcohol/week: 0.0 standard drinks    Comment: 09/09/2017  "Quit all alcohol in 1970's"  . Drug use: Never  . Sexual activity: Not on file

## 2019-03-28 LAB — URIC ACID: Uric Acid, Serum: 7.3 mg/dL (ref 4.0–8.0)

## 2019-03-29 ENCOUNTER — Telehealth: Payer: Self-pay

## 2019-03-29 NOTE — Telephone Encounter (Signed)
Called pt and advised of lab results. Advised to increase Allopurinol per Dr. Sharol Given to twice a day and we will recheck his uric acid level at next office visit.

## 2019-03-30 ENCOUNTER — Other Ambulatory Visit: Payer: Self-pay

## 2019-03-30 ENCOUNTER — Ambulatory Visit (INDEPENDENT_AMBULATORY_CARE_PROVIDER_SITE_OTHER): Payer: Medicare Other | Admitting: Family Medicine

## 2019-03-30 ENCOUNTER — Encounter: Payer: Self-pay | Admitting: Family Medicine

## 2019-03-30 DIAGNOSIS — R1319 Other dysphagia: Secondary | ICD-10-CM

## 2019-03-30 DIAGNOSIS — R42 Dizziness and giddiness: Secondary | ICD-10-CM

## 2019-03-30 DIAGNOSIS — I2510 Atherosclerotic heart disease of native coronary artery without angina pectoris: Secondary | ICD-10-CM | POA: Diagnosis not present

## 2019-03-30 DIAGNOSIS — K222 Esophageal obstruction: Secondary | ICD-10-CM

## 2019-03-30 DIAGNOSIS — M109 Gout, unspecified: Secondary | ICD-10-CM

## 2019-03-30 DIAGNOSIS — I4819 Other persistent atrial fibrillation: Secondary | ICD-10-CM | POA: Diagnosis not present

## 2019-03-30 DIAGNOSIS — R1310 Dysphagia, unspecified: Secondary | ICD-10-CM

## 2019-03-30 DIAGNOSIS — R238 Other skin changes: Secondary | ICD-10-CM

## 2019-03-30 MED ORDER — TRIAMCINOLONE ACETONIDE 0.1 % EX CREA: 1.0000 "application " | TOPICAL_CREAM | Freq: Two times a day (BID) | CUTANEOUS | 0 refills | Status: DC

## 2019-03-30 MED ORDER — COLCHICINE 0.6 MG PO TABS: 0.6000 mg | ORAL_TABLET | Freq: Two times a day (BID) | ORAL | Status: DC | PRN

## 2019-03-30 MED ORDER — WARFARIN SODIUM 4 MG PO TABS: ORAL_TABLET | ORAL | Status: DC

## 2019-03-30 MED ORDER — ALLOPURINOL 100 MG PO TABS: 200.0000 mg | ORAL_TABLET | Freq: Every day | ORAL | Status: DC

## 2019-03-30 MED ORDER — OMEPRAZOLE 40 MG PO CPDR: 40.0000 mg | DELAYED_RELEASE_CAPSULE | Freq: Every day | ORAL | Status: DC

## 2019-03-30 MED ORDER — LISINOPRIL 20 MG PO TABS: 20.0000 mg | ORAL_TABLET | Freq: Every day | ORAL | Status: DC

## 2019-03-30 NOTE — Patient Instructions (Addendum)
I usually tell patients not to increase allopurinol in the middle of a flare.  I would try the colchicine twice a day for a few more days prior to increasing the allopurinol to twice a day.   Use triamcinolone on the skin changes on your wrist and let me know if that doesn't help.  It looks more irritated and not infected.   Take care.  Glad to see you.

## 2019-03-30 NOTE — Progress Notes (Signed)
This visit occurred during the SARS-CoV-2 public health emergency.  Safety protocols were in place, including screening questions prior to the visit, additional usage of staff PPE, and extensive cleaning of exam room while observing appropriate contact time as indicated for disinfecting solutions.  No known bite but woke up with redness at the R wrist.  Had been using topical abx and peroxide. No fevers.  Not painful not but itchy.    H/o R 2nd toe amputation with gout flare in the R3rd toe. He has been taking 100mg  allopurinol daily.  Recent uric acid 7.3.    Coumadin monitored through cardiology.    Meds, vitals, and allergies reviewed.   ROS: Per HPI unless specifically indicated in ROS section   nad ncat Right wrist with 2 cm area of superficially inflamed skin that looks to be more irritated than infected.  No fluctuant mass. Right foot with second toe amputation site dressed.  He has some gouty changes on the distal right third toe.

## 2019-04-02 DIAGNOSIS — R238 Other skin changes: Secondary | ICD-10-CM | POA: Insufficient documentation

## 2019-04-02 NOTE — Assessment & Plan Note (Signed)
Continue as is for now and then increase allopurinol in a few days.  See after visit summary.  He agrees.

## 2019-04-02 NOTE — Assessment & Plan Note (Signed)
Looks to be irritated but not infected.  Discussed.  Can use triamcinolone cream and then update me as needed.

## 2019-04-06 ENCOUNTER — Other Ambulatory Visit: Payer: Self-pay | Admitting: Cardiovascular Disease

## 2019-04-09 ENCOUNTER — Other Ambulatory Visit: Payer: Self-pay | Admitting: Cardiovascular Disease

## 2019-04-17 ENCOUNTER — Other Ambulatory Visit: Payer: Self-pay

## 2019-04-17 ENCOUNTER — Ambulatory Visit (INDEPENDENT_AMBULATORY_CARE_PROVIDER_SITE_OTHER): Payer: Medicare Other | Admitting: Orthopedic Surgery

## 2019-04-17 ENCOUNTER — Encounter: Payer: Self-pay | Admitting: Orthopedic Surgery

## 2019-04-17 VITALS — Ht 66.0 in | Wt 216.8 lb

## 2019-04-17 DIAGNOSIS — M869 Osteomyelitis, unspecified: Secondary | ICD-10-CM | POA: Diagnosis not present

## 2019-04-17 DIAGNOSIS — M1A0710 Idiopathic chronic gout, right ankle and foot, without tophus (tophi): Secondary | ICD-10-CM | POA: Diagnosis not present

## 2019-04-17 DIAGNOSIS — I2510 Atherosclerotic heart disease of native coronary artery without angina pectoris: Secondary | ICD-10-CM

## 2019-04-17 NOTE — Progress Notes (Signed)
Office Visit Note   Patient: [name]           Date of Birth: 07/07/1930           MRN: [id number] Visit Date: 04/17/2019              Requested by: Tonia Ghent, MD [address] [place],  [place] [address] PCP: Tonia Ghent, MD  Chief Complaint  Patient presents with  . Right Foot - Routine Post Op    03/03/19 right foot 2nd toe amputation       HPI: This is a pleasant gentleman who is 6 weeks status post right second toe amputation.  He also has a history of gout with the most recent uric acid being 7.3.  He did increase his allopurinol.  He does have some swelling and redness in his third toe on the right foot he said this has gotten gout before but seems to be a little bit better today  Assessment & Plan: Visit Diagnoses: No diagnosis found.  Plan: We will draw a uric acid today.  Follow-up in 1 month to ensure the third toe on the right foot continues to do well  Follow-Up Instructions: No follow-ups on file.   Ortho Exam  Patient is alert, oriented, no adenopathy, well-dressed, normal affect, normal respiratory effort. Focused examination of his right foot demonstrates absence second toe well-healed surgical incision he has some mild erythema and swelling in the third toe there is no fluctuance I could not express any fluid.  No surrounding cellulitis or drainage  Imaging: No results found. No images are attached to the encounter.  Labs: Lab Results  Component Value Date   HGBA1C 6.7 (A) 12/23/2018   HGBA1C 6.7 (H) 11/04/2017   HGBA1C  07/31/2008    6.1 (NOTE) The ADA recommends the following therapeutic goal for glycemic control related to Hgb A1c measurement: Goal of therapy: <6.5 Hgb A1c  Reference: American Diabetes Association: Clinical Practice Recommendations 2010, Diabetes Care, 2010, 33: (Suppl  1).   ESRSEDRATE 17 (H) 09/09/2017   CRP 1.1 (H) 09/09/2017   LABURIC 7.3 03/27/2019   LABURIC 6.6 12/03/2017   LABURIC 9.3 (H)  11/04/2017   REPTSTATUS 09/19/2017 FINAL 09/14/2017   GRAMSTAIN  09/10/2017    MODERATE WBC PRESENT,BOTH PMN AND MONONUCLEAR NO ORGANISMS SEEN    CULT  09/14/2017    NO GROWTH 5 DAYS Performed at [facility], [place] [address] [place], [place] [address]      Lab Results  Component Value Date   ALBUMIN 3.2 (L) 09/14/2017   ALBUMIN 4.3 09/09/2017   ALBUMIN 4.4 02/13/2016   LABURIC 7.3 03/27/2019   LABURIC 6.6 12/03/2017   LABURIC 9.3 (H) 11/04/2017    Lab Results  Component Value Date   MG 2.2 09/09/2017   No results found for: VD25OH  No results found for: PREALBUMIN CBC EXTENDED Latest Ref Rng & Units 03/03/2019 07/09/2018 12/03/2017  WBC 4.0 - 10.5 K/uL 6.0 7.7 8.4  RBC 4.22 - 5.81 MIL/uL 5.24 5.35 5.32  HGB 13.0 - 17.0 g/dL 15.7 15.4 15.7  HCT 39.0 - 52.0 % 48.2 47.3 46.1  PLT 150 - 400 K/uL 153 154 143.0 Result may be falsely decreased due to platelet clumping.(L)  NEUTROABS 1.7 - 7.7 K/uL - 5.5 5.2  LYMPHSABS 0.7 - 4.0 K/uL - 1.3 1.6     Body mass index is 34.99 kg/m.  Orders:  No orders of the defined types  were placed in this encounter.  No orders of the defined types were placed in this encounter.    Procedures: No procedures performed  Clinical Data: No additional findings.  ROS:  All other systems negative, except as noted in the HPI. Review of Systems  Objective: Vital Signs: Ht 5\' 6"  (1.676 m)   Wt 216 lb 12.8 oz (98.3 kg)   BMI 34.99 kg/m   Specialty Comments:  No specialty comments available.  PMFS History: Patient Active Problem List   Diagnosis Date Noted  . Skin irritation 04/02/2019  . Osteomyelitis of second toe of right foot ([place])   . Acute non-recurrent maxillary sinusitis 05/25/2018  . Achilles tendinitis 12/05/2017  . Idiopathic chronic gout, unspecified site, without tophus (tophi) 10/06/2017  . Cutaneous abscess of right foot   . Atypical angina ([place]) 11/08/2015  . Acute diastolic heart failure ([place])   . CAD  S/P percutaneous coronary angioplasty   . Chronic atrial fibrillation ([place])   . CKD (chronic kidney disease), stage III   . Persistent atrial fibrillation ([place])   . Hypertension   . Hyperlipidemia   . Bradycardia   . Back pain 05/24/2015  . Triceps tendonitis 05/24/2015  . Diabetes mellitus without complication ([place]) 78/29/5621  . Dysphagia, pharyngoesophageal phase 09/25/2014  . History of esophageal stricture 09/25/2014  . Chronic anticoagulation 09/25/2014  . Helicobacter pylori duodenitis 09/25/2014  . Cough 03/14/2014  . Benign paroxysmal positional vertigo 03/14/2014  . Encounter for therapeutic drug monitoring 04/12/2013  . Esophageal stricture from GERD 06/08/2011  . Advance care planning 04/20/2011  . OSA on CPAP 09/22/2010  . HYPERCHOLESTEROLEMIA 08/15/2008  . Gout 08/15/2008  . OBESITY 08/15/2008  . Essential hypertension 08/15/2008  . CAD (coronary artery disease), native coronary artery 08/15/2008  . GASTROESOPHAGEAL REFLUX DISEASE 08/15/2008   Past Medical History:  Diagnosis Date  . Bradycardia    a. nocturnal with pauses overnight on tele likely due to OSA 10/2015.  [name] Cancer ([place])    skin cancer  . CKD (chronic kidney disease), stage III   . Coronary artery disease    a. prev nonobst. b. LHC 10/24/15: occlusion of small diffusely diseased OM2 s/p balloon angioplasty, mild nonobstructive disease of mLAD and mRCA, normal LVEDP  . Diabetes mellitus without complication ([place])   . Diverticulosis   . Esophageal reflux   . Esophageal stricture from GERD 06/08/2011   With stricture at GE junction on EGD, dilated 06/2011   . Gastric ulcer    on EGD 2013  . Gout, unspecified    "on daily RX" (09/09/2017)  . History of blood transfusion 02/1964   "when I had right little finger cut off"  . Hyperlipidemia   . Hypertension   . Obesity, unspecified   . Orthopnea   . OSA on CPAP   . Paroxysmal nocturnal dyspnea   . Persistent atrial fibrillation ([place])   . Vertigo      Family History  Problem Relation Age of Onset  . Stomach cancer Mother        died in 75's   . Hypertension Father        died in his 41's pna likely dementia  . Dementia Father   . Pneumonia Father   . Heart disease Brother        S/P CABG  . Hypertension Other   . Prostate cancer Neg Hx   . Colon cancer Neg Hx     Past Surgical History:  Procedure Laterality Date  . AMPUTATION  Right 03/03/2019   Procedure: RIGHT 2ND TOE AMPUTATION;  Surgeon: [name], MD;  Location: [facility];  Service: Orthopedics;  Laterality: Right;  . BALLOON DILATION N/A 10/22/2014   Procedure: BALLOON DILATION;  Surgeon: [name], MD;  [facility];  Service: Endoscopy;  Laterality: N/A;  . CARDIAC CATHETERIZATION  07/2008  . CARDIAC CATHETERIZATION N/A 10/24/2015   Procedure: Left Heart Cath and Coronary Angiography;  Surgeon: [name], MD;  Location: [facility];  Service: Cardiovascular;  Laterality: N/A;  . CARDIAC CATHETERIZATION N/A 10/24/2015   Procedure: Coronary Balloon Angioplasty;  Surgeon: [name], MD;  Location: [facility];  Service: Cardiovascular;  Laterality: N/A;  . Carotid Dopplers  09/2009   no sig extracranial stenosis and vertebral arteries had antegrade flow  . CATARACT EXTRACTION W/ INTRAOCULAR LENS  IMPLANT, BILATERAL Bilateral   . ESOPHAGOGASTRODUODENOSCOPY (EGD) WITH ESOPHAGEAL DILATION     "I've had it stretched 3 times" (10/23/2015)  . ESOPHAGOGASTRODUODENOSCOPY (EGD) WITH ESOPHAGEAL DILATION  06/2011   /medical hx above  (09/09/2017)  . ESOPHAGOGASTRODUODENOSCOPY (EGD) WITH PROPOFOL N/A 10/22/2014   Procedure: ESOPHAGOGASTRODUODENOSCOPY (EGD) WITH PROPOFOL;  Surgeon: [name], MD;  [facility];  Service: Endoscopy;  Laterality: N/A;  . FINGER SURGERY Right 02/1964   "cut little finger off; had it reattached"  . I & D EXTREMITY Right 09/10/2017   Procedure: RIGHT FOOT DEBRIDEMENT;  Surgeon: [name], MD;  Location:  [facility];  Service: Orthopedics;  Laterality: Right;  . MRI of brain  09/2009   chronic microvascular ischemia  . OSA Sleep Study     per Dr. Brett Fairy  . SHOULDER ARTHROSCOPY W/ ROTATOR CUFF REPAIR Right 2008   Social History   Occupational History  . 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 Smoking status: Former Smoker    Packs/day: 0.12    Years: 4.00    Pack years: 0.48    Types: Cigarettes    Quit date: 03/16/1969    Years since quitting: 50.1  . Smokeless tobacco: Former Systems developer  . Tobacco comment: "chewed when I smoked; smoked 1 pack/week for about 4 years, quit 1971  Substance and Sexual Activity  . Alcohol use: Not Currently    Alcohol/week: 0.0 standard drinks    Comment: 09/09/2017  "Quit all alcohol in 1970's"  . Drug use: Never  . Sexual activity: Not on file

## 2019-04-17 NOTE — Addendum Note (Signed)
Addended by: Lorayne Bender on: 04/17/2019 01:40 PM   Modules accepted: Orders

## 2019-04-18 LAB — URIC ACID: Uric Acid, Serum: 7.7 mg/dL (ref 4.0–8.0)

## 2019-04-20 ENCOUNTER — Other Ambulatory Visit: Payer: Self-pay

## 2019-04-20 ENCOUNTER — Ambulatory Visit (INDEPENDENT_AMBULATORY_CARE_PROVIDER_SITE_OTHER): Payer: Medicare Other | Admitting: *Deleted

## 2019-04-20 DIAGNOSIS — I4819 Other persistent atrial fibrillation: Secondary | ICD-10-CM | POA: Diagnosis not present

## 2019-04-20 DIAGNOSIS — Z5181 Encounter for therapeutic drug level monitoring: Secondary | ICD-10-CM

## 2019-04-20 LAB — POCT INR: INR: 2.7 (ref 2.0–3.0)

## 2019-04-20 NOTE — Patient Instructions (Signed)
Description   Continue on same dosage 1 tablet daily except 1.5 tablets each Sundays and Thursdays. Recheck INR in 5 weeks. [facility] [phone]

## 2019-04-25 ENCOUNTER — Ambulatory Visit: Payer: Medicare Other | Admitting: Sports Medicine

## 2019-04-26 ENCOUNTER — Other Ambulatory Visit: Payer: Self-pay | Admitting: Cardiovascular Disease

## 2019-04-26 ENCOUNTER — Other Ambulatory Visit: Payer: Self-pay | Admitting: Family Medicine

## 2019-04-26 DIAGNOSIS — R1310 Dysphagia, unspecified: Secondary | ICD-10-CM

## 2019-04-26 DIAGNOSIS — K222 Esophageal obstruction: Secondary | ICD-10-CM

## 2019-04-26 DIAGNOSIS — R1319 Other dysphagia: Secondary | ICD-10-CM

## 2019-04-26 NOTE — Telephone Encounter (Signed)
Okay to continue.  Sent.  Thanks. 

## 2019-04-26 NOTE — Telephone Encounter (Signed)
Last office visit 01/14/20201 for gout.  Last refilled 03/30/2019 for 30 g with no refills.  No future appointments with PCP.

## 2019-05-15 ENCOUNTER — Encounter: Payer: Self-pay | Admitting: Orthopedic Surgery

## 2019-05-15 ENCOUNTER — Other Ambulatory Visit: Payer: Self-pay

## 2019-05-15 ENCOUNTER — Telehealth: Payer: Self-pay | Admitting: Family Medicine

## 2019-05-15 ENCOUNTER — Ambulatory Visit (INDEPENDENT_AMBULATORY_CARE_PROVIDER_SITE_OTHER): Payer: Medicare Other | Admitting: Orthopedic Surgery

## 2019-05-15 DIAGNOSIS — M79675 Pain in left toe(s): Secondary | ICD-10-CM | POA: Diagnosis not present

## 2019-05-15 DIAGNOSIS — M869 Osteomyelitis, unspecified: Secondary | ICD-10-CM | POA: Diagnosis not present

## 2019-05-15 DIAGNOSIS — I2510 Atherosclerotic heart disease of native coronary artery without angina pectoris: Secondary | ICD-10-CM | POA: Diagnosis not present

## 2019-05-15 DIAGNOSIS — E119 Type 2 diabetes mellitus without complications: Secondary | ICD-10-CM

## 2019-05-15 DIAGNOSIS — R5383 Other fatigue: Secondary | ICD-10-CM

## 2019-05-15 MED ORDER — ALLOPURINOL 100 MG PO TABS: 100.0000 mg | ORAL_TABLET | Freq: Two times a day (BID) | ORAL | 3 refills | Status: DC

## 2019-05-15 NOTE — Telephone Encounter (Signed)
Patient's daughter, Lawerance Bach, called and scheduled patient for his cpx on 05/19/19.   Patient's coming in for lab work on 05/16/19.  Please order labs and Tabitha's requesting patient's B-12 be checked because patient stays tired.

## 2019-05-16 ENCOUNTER — Ambulatory Visit (INDEPENDENT_AMBULATORY_CARE_PROVIDER_SITE_OTHER): Payer: Medicare Other

## 2019-05-16 ENCOUNTER — Other Ambulatory Visit (INDEPENDENT_AMBULATORY_CARE_PROVIDER_SITE_OTHER): Payer: Medicare Other

## 2019-05-16 VITALS — BP 138/62 | Wt 210.0 lb

## 2019-05-16 DIAGNOSIS — Z0000 Encounter for general adult medical examination without abnormal findings: Secondary | ICD-10-CM | POA: Diagnosis not present

## 2019-05-16 DIAGNOSIS — R5383 Other fatigue: Secondary | ICD-10-CM

## 2019-05-16 DIAGNOSIS — E119 Type 2 diabetes mellitus without complications: Secondary | ICD-10-CM

## 2019-05-16 LAB — COMPREHENSIVE METABOLIC PANEL
ALT: 24 U/L (ref 0–53)
AST: 26 U/L (ref 0–37)
Albumin: 4.2 g/dL (ref 3.5–5.2)
Alkaline Phosphatase: 97 U/L (ref 39–117)
BUN: 15 mg/dL (ref 6–23)
CO2: 28 mEq/L (ref 19–32)
Calcium: 9.4 mg/dL (ref 8.4–10.5)
Chloride: 100 mEq/L (ref 96–112)
Creatinine, Ser: 1.32 mg/dL (ref 0.40–1.50)
GFR: 51.38 mL/min — ABNORMAL LOW (ref >60.00)
Glucose, Bld: 126 mg/dL — ABNORMAL HIGH (ref 70–99)
Potassium: 4.2 mEq/L (ref 3.5–5.1)
Sodium: 139 mEq/L (ref 135–145)
Total Bilirubin: 0.7 mg/dL (ref 0.2–1.2)
Total Protein: 7.4 g/dL (ref 6.0–8.3)

## 2019-05-16 LAB — LIPID PANEL
Cholesterol: 166 mg/dL (ref 0–200)
HDL: 26.4 mg/dL — ABNORMAL LOW (ref >39.00)
NonHDL: 139.36
Total CHOL/HDL Ratio: 6
Triglycerides: 279 mg/dL — ABNORMAL HIGH (ref 0.0–149.0)
VLDL: 55.8 mg/dL — ABNORMAL HIGH (ref 0.0–40.0)

## 2019-05-16 LAB — TSH: TSH: 2.33 u[IU]/mL (ref 0.35–4.50)

## 2019-05-16 LAB — LDL CHOLESTEROL, DIRECT: Direct LDL: 95 mg/dL

## 2019-05-16 LAB — CBC WITH DIFFERENTIAL/PLATELET
Basophils Absolute: 0.1 10*3/uL (ref 0.0–0.1)
Basophils Relative: 0.9 % (ref 0.0–3.0)
Eosinophils Absolute: 0.3 10*3/uL (ref 0.0–0.7)
Eosinophils Relative: 4.8 % (ref 0.0–5.0)
HCT: 46.4 % (ref 39.0–52.0)
Hemoglobin: 15.8 g/dL (ref 13.0–17.0)
Lymphocytes Relative: 27.1 % (ref 12.0–46.0)
Lymphs Abs: 1.6 10*3/uL (ref 0.7–4.0)
MCHC: 34.1 g/dL (ref 30.0–36.0)
MCV: 87.8 fl (ref 78.0–100.0)
Monocytes Absolute: 0.6 10*3/uL (ref 0.1–1.0)
Monocytes Relative: 9.9 % (ref 3.0–12.0)
Neutro Abs: 3.5 10*3/uL (ref 1.4–7.7)
Neutrophils Relative %: 57.3 % (ref 43.0–77.0)
Platelets: 106 10*3/uL — ABNORMAL LOW (ref 150.0–400.0)
RBC: 5.28 Mil/uL (ref 4.22–5.81)
RDW: 14.9 % (ref 11.5–15.5)
WBC: 6.1 10*3/uL (ref 4.0–10.5)

## 2019-05-16 LAB — URIC ACID: Uric Acid, Serum: 7.4 mg/dL (ref 4.0–8.0)

## 2019-05-16 LAB — VITAMIN B12: Vitamin B-12: 145 pg/mL — ABNORMAL LOW (ref 211–911)

## 2019-05-16 LAB — HEMOGLOBIN A1C: Hgb A1c MFr Bld: 6.8 % — ABNORMAL HIGH (ref 4.6–6.5)

## 2019-05-16 NOTE — Telephone Encounter (Signed)
Please order the B12 and print the ABN and give it to [place], that way we have it when the patient comes in with the estimated cost and after discussing with the patient they can sign or decline, we will cancel .

## 2019-05-16 NOTE — Progress Notes (Signed)
Subjective:   [name] is a 86 y.o. male who presents for Medicare Annual/Subsequent preventive examination.  Review of Systems: N/A   This visit is being conducted through telemedicine via telephone at the nurse health advisor's home address due to the COVID-19 pandemic. This patient has given me verbal consent via doximity to conduct this visit, patient states they are participating from their home address. Patient and myself are on the telephone call. There is no referral for this visit. Some vital signs may be absent or patient reported.    Patient identification: identified by name, DOB, and current address   Cardiac Risk Factors include: advanced age (>19men, >20 women);diabetes mellitus;hypertension;dyslipidemia;male gender     Objective:    Vitals: BP 138/62   Wt 210 lb (95.3 kg)   BMI 33.89 kg/m   Body mass index is 33.89 kg/m.  Advanced Directives 05/16/2019 02/17/2019 07/09/2018 09/09/2017 02/13/2016 11/08/2015 11/08/2015  Does Patient Have a Medical Advance Directive? No Yes No Yes No No No  Type of Advance Directive - Healthcare Power of [place]  Does patient want to make changes to medical advance directive? - No - Patient declined - No - Patient declined - - -  Copy of [place] in Chart? - - - No - copy requested - - -  Would patient like information on creating a medical advance directive? No - Patient declined - No - Guardian declined - - No - patient declined information No - patient declined information    Tobacco Social History   Tobacco Use  Smoking Status Former Smoker  . Packs/day: 0.12  . Years: 4.00  . Pack years: 0.48  . Types: Cigarettes  . Quit date: 03/16/1969  . Years since quitting: 50.2  Smokeless Tobacco Former Systems developer  Tobacco Comment   "chewed when I smoked; smoked 1 pack/week for about 4 years, quit 1971     Counseling given: Not Answered Comment: "chewed when I smoked; smoked 1  pack/week for about 4 years, quit 1971   Clinical Intake:  Pre-visit preparation completed: Yes  Pain : No/denies pain     Nutritional Risks: None Diabetes: Yes CBG done?: No Did pt. bring in CBG monitor from home?: No  How often do you need to have someone help you when you read instructions, pamphlets, or other written materials from your doctor or pharmacy?: 1 - Never What is the last grade level you completed in school?: 10th  Interpreter Needed?: No  Information entered by :: CJohnson, LPN  Past Medical History:  Diagnosis Date  . Bradycardia    a. nocturnal with pauses overnight on tele likely due to OSA 10/2015.  [name] Cancer ([place])    skin cancer  . CKD (chronic kidney disease), stage III   . Coronary artery disease    a. prev nonobst. b. LHC 10/24/15: occlusion of small diffusely diseased OM2 s/p balloon angioplasty, mild nonobstructive disease of mLAD and mRCA, normal LVEDP  . Diabetes mellitus without complication ([place])   . Diverticulosis   . Esophageal reflux   . Esophageal stricture from GERD 06/08/2011   With stricture at GE junction on EGD, dilated 06/2011   . Gastric ulcer    on EGD 2013  . Gout, unspecified    "on daily RX" (09/09/2017)  . History of blood transfusion 02/1964   "when I had right little finger cut off"  . Hyperlipidemia   . Hypertension   .  Obesity, unspecified   . Orthopnea   . OSA on CPAP   . Paroxysmal nocturnal dyspnea   . Persistent atrial fibrillation ([place])   . Vertigo    Past Surgical History:  Procedure Laterality Date  . AMPUTATION Right 03/03/2019   Procedure: RIGHT 2ND TOE AMPUTATION;  Surgeon: [name], MD;  Location: [facility];  Service: Orthopedics;  Laterality: Right;  . BALLOON DILATION N/A 10/22/2014   Procedure: BALLOON DILATION;  Surgeon: [name], MD;  [facility];  Service: Endoscopy;  Laterality: N/A;  . CARDIAC CATHETERIZATION  07/2008  . CARDIAC CATHETERIZATION N/A 10/24/2015   Procedure: Left  Heart Cath and Coronary Angiography;  Surgeon: [name], MD;  Location: [facility];  Service: Cardiovascular;  Laterality: N/A;  . CARDIAC CATHETERIZATION N/A 10/24/2015   Procedure: Coronary Balloon Angioplasty;  Surgeon: [name], MD;  Location: [facility];  Service: Cardiovascular;  Laterality: N/A;  . Carotid Dopplers  09/2009   no sig extracranial stenosis and vertebral arteries had antegrade flow  . CATARACT EXTRACTION W/ INTRAOCULAR LENS  IMPLANT, BILATERAL Bilateral   . ESOPHAGOGASTRODUODENOSCOPY (EGD) WITH ESOPHAGEAL DILATION     "I've had it stretched 3 times" (10/23/2015)  . ESOPHAGOGASTRODUODENOSCOPY (EGD) WITH ESOPHAGEAL DILATION  06/2011   /medical hx above  (09/09/2017)  . ESOPHAGOGASTRODUODENOSCOPY (EGD) WITH PROPOFOL N/A 10/22/2014   Procedure: ESOPHAGOGASTRODUODENOSCOPY (EGD) WITH PROPOFOL;  Surgeon: [name], MD;  [facility];  Service: Endoscopy;  Laterality: N/A;  . FINGER SURGERY Right 02/1964   "cut little finger off; had it reattached"  . I & D EXTREMITY Right 09/10/2017   Procedure: RIGHT FOOT DEBRIDEMENT;  Surgeon: [name], MD;  Location: [facility];  Service: Orthopedics;  Laterality: Right;  . MRI of brain  09/2009   chronic microvascular ischemia  . OSA Sleep Study     per Dr. Brett Fairy  . SHOULDER ARTHROSCOPY W/ ROTATOR CUFF REPAIR Right 2008   Family History  Problem Relation Age of Onset  . Stomach cancer Mother        died in 11's   . Hypertension Father        died in his 79's pna likely dementia  . Dementia Father   . Pneumonia Father   . Heart disease Brother        S/P CABG  . Hypertension Other   . Prostate cancer Neg Hx   . Colon cancer Neg Hx    Social History   Socioeconomic History  . Marital status: Widowed    Spouse name: Not on file  . Number of children: 3  . Years of education: Not on file  . Highest education level: Not on file  Occupational History  . 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 Smoking status: Former Smoker    Packs/day: 0.12    Years: 4.00    Pack years: 0.48    Types: Cigarettes    Quit date: 03/16/1969    Years since quitting: 50.2  . Smokeless tobacco: Former Systems developer  . Tobacco comment: "chewed when I smoked; smoked 1 pack/week for about 4 years, quit 1971  Substance and Sexual Activity  . Alcohol use: Not Currently    Alcohol/week: 0.0 standard drinks    Comment: 09/09/2017  "Quit all alcohol in 1970's"  . Drug use: Never  . Sexual activity: Not on file  Other Topics Concern  . Not on file  Social History Narrative   Lives in [place], married 1953, widowed after 95 years   Retired Administrator, still does Biomedical scientist.   He is not routinely exercising.   Daily caffeine    Social Determinants of Health   Financial Resource Strain: Low Risk   . Difficulty of Paying Living Expenses: Not hard at all  Food Insecurity: No Food Insecurity  . Worried About Charity fundraiser in the Last Year: Never true  . Ran Out of Food in the Last Year: Never true  Transportation Needs: No Transportation Needs  . Lack of Transportation (Medical): No  . Lack of Transportation (Non-Medical): No  Physical Activity: Inactive  . Days of Exercise per Week: 0 days  . Minutes of Exercise per Session: 0 min  Stress: No Stress Concern Present  . Feeling of Stress : Not at all  Social Connections:   . Frequency of Communication with Friends and Family: Not on file  . Frequency of Social Gatherings with Friends and Family: Not on file  . Attends Religious Services: Not on file  . Active Member of Clubs or Organizations: Not on file  . Attends Archivist Meetings: Not on file  . Marital Status: Not on file    Outpatient Encounter Medications as of 05/16/2019  Medication Sig  . acetaminophen (TYLENOL) 500 MG tablet Take 1,000 mg by mouth as needed for mild pain.  [name] allopurinol (ZYLOPRIM) 100 MG tablet Take 2  tablets (200 mg total) by mouth daily.  [name] allopurinol (ZYLOPRIM) 100 MG tablet Take 1 tablet (100 mg total) by mouth 2 (two) times daily.  . colchicine 0.6 MG tablet Take 1 tablet (0.6 mg total) by mouth 2 (two) times daily as needed (gout flare).  . diclofenac Sodium (VOLTAREN) 1 % GEL Apply 1 application topically 4 (four) times daily as needed (pain).  [name] diltiazem (CARDIZEM CD) 180 MG 24 hr capsule TAKE 1 CAPSULE (180 MG TOTAL) BY MOUTH DAILY.  . furosemide (LASIX) 20 MG tablet Take 1 tablet (20 mg total) by mouth daily. Please make yearly appt with Dr. Burt Knack for May before anymore refills. 1st attempt  . HYDROcodone-acetaminophen (NORCO/VICODIN) 5-325 MG tablet Take 1 tablet by mouth every 4 (four) hours as needed for moderate pain.  [name] lisinopril (ZESTRIL) 20 MG tablet Take 1 tablet (20 mg total) by mouth daily.  [name] loratadine (CLARITIN) 10 MG tablet Take 10 mg by mouth daily as needed for allergies.  . metoprolol tartrate (LOPRESSOR) 25 MG tablet Take 0.5 tablets (12.5 mg total) by mouth 2 (two) times daily.  . nitroGLYCERIN (NITROSTAT) 0.4 MG SL tablet Place 1 tablet (0.4 mg total) under the tongue every 5 (five) minutes x 3 doses as needed for chest pain.  [name] omeprazole (PRILOSEC) 40 MG capsule TAKE 1 CAPSULE BY MOUTH EVERY DAY  . triamcinolone cream (KENALOG) 0.1 % APPLY TO AFFECTED AREA TWICE A DAY  . warfarin (COUMADIN) 4 MG tablet TAKE AS DIRECTED BY COUMADIN CLINIC   No facility-administered encounter medications on file as of 05/16/2019.    Activities of Daily Living In your present state of health, do you have any difficulty performing the following activities: 05/16/2019 03/03/2019  Hearing? Y -  Comment hearing loss noted -  Vision? N -  Difficulty concentrating or making decisions? N -  Walking or climbing stairs? N -  Dressing or bathing? N -  Doing errands, shopping? N N  Preparing Food and eating ? N -  Using the Toilet?  N -  In the past six months, have you accidently  leaked urine? N -  Do you have problems with loss of bowel control? N -  Managing your Medications? N -  Managing your Finances? N -  Housekeeping or managing your Housekeeping? N -  Some recent data might be hidden    Patient Care Team: Tonia Ghent, MD as PCP - Cyndia Diver, MD as PCP - Cardiology (Cardiology) Sherren Mocha, MD (Cardiology)   Assessment:   This is a routine wellness examination for Ama[place].  Exercise Activities and Dietary recommendations Current Exercise Habits: The patient does not participate in regular exercise at present, Exercise limited by: None identified  Goals    . Increase physical activity     Starting 02/13/2016, I will continue to exercise at least 30 min daily.     . Patient Stated     05/16/2019, I will maintain and continue medications as prescribed.        Fall Risk Fall Risk  05/16/2019 03/30/2019 10/14/2017 02/20/2016 02/13/2016  Falls in the past year? 0 0 No Yes No  Comment - - Emmi Telephone Survey: data to providers prior to load Emmi Telephone Survey: data to providers prior to load -  Number falls in past yr: 0 0 - 2 or more -  Comment - - - Emmi Telephone Survey Actual Response = 2 -  Injury with Fall? 0 - - No -  Risk for fall due to : Medication side effect - - - -  Follow up Falls evaluation completed;Falls prevention discussed Falls evaluation completed - - -   Is the patient's home free of loose throw rugs in walkways, pet beds, electrical cords, etc?   yes      Grab bars in the bathroom? yes      Handrails on the stairs?   yes      Adequate lighting?   yes  Timed Get Up and Go Performed: N/A  Depression Screen PHQ 2/9 Scores 05/16/2019 03/30/2019 02/13/2016 12/17/2014  PHQ - 2 Score 0 0 0 0  PHQ- 9 Score 0 - - -    Cognitive Function MMSE - Mini Mental State Exam 05/16/2019 02/13/2016  Orientation to time 5 5  Orientation to Place 5 5  Registration 3 3  Attention/ Calculation 5 0  Recall 2 3  Language- name  2 objects - 0  Language- repeat 1 1  Language- follow 3 step command - 3  Language- read & follow direction - 0  Write a sentence - 0  Copy design - 0  Total score - 20  Mini Cog  Mini-Cog screen was completed. Maximum score is 22. A value of 0 denotes this part of the MMSE was not completed or the patient failed this part of the Mini-Cog screening.       Immunization History  Administered Date(s) Administered  . Influenza, High Dose Seasonal PF 12/15/2016, 12/19/2018  . Influenza, Seasonal, Injecte, Preservative Fre 12/14/2013  . Influenza,inj,Quad PF,6+ Mos 12/17/2014, 01/29/2018  . Influenza-Unspecified 12/15/2016, 12/19/2018  . Pneumococcal Conjugate-13 12/17/2014  . Pneumococcal Polysaccharide-23 09/19/2009  . Td 09/19/2009  . Zoster 09/19/2009    Qualifies for Shingles Vaccine: Yes  Screening Tests Health Maintenance  Topic Date Due  . OPHTHALMOLOGY EXAM  12/30/1940  . HEMOGLOBIN A1C  06/23/2019  . TETANUS/TDAP  09/20/2019  . FOOT EXAM  12/23/2019  . INFLUENZA VACCINE  Completed  . PNA vac Low Risk Adult  Completed  Cancer Screenings: Lung: Low Dose CT Chest recommended if Age 65-80 years, 30 pack-year currently smoking OR have quit w/in 15 years. Patient does not qualify. Colorectal: no longer required  Additional Screenings:  Hepatitis C Screening: N/A      Plan:   Patient will maintain and continue medications as prescribed.   I have personally reviewed and noted the following in the patient's chart:   . Medical and social history . Use of alcohol, tobacco or illicit drugs  . Current medications and supplements . Functional ability and status . Nutritional status . Physical activity . Advanced directives . List of other physicians . Hospitalizations, surgeries, and ER visits in previous 12 months . Vitals . Screenings to include cognitive, depression, and falls . Referrals and appointments  In addition, I have reviewed and discussed with  patient certain preventive protocols, quality metrics, and best practice recommendations. A written personalized care plan for preventive services as well as general preventive health recommendations were provided to patient.     Andrez Grime, LPN  03/23/5629

## 2019-05-16 NOTE — Patient Instructions (Signed)
[name] , Thank you for taking time to come for your Medicare Wellness Visit. I appreciate your ongoing commitment to your health goals. Please review the following plan we discussed and let me know if I can assist you in the future.   Screening recommendations/referrals: Colonoscopy: no longer required Recommended yearly ophthalmology/optometry visit for glaucoma screening and checkup Recommended yearly dental visit for hygiene and checkup  Vaccinations: Influenza vaccine: Up to date, completed 12/20/2018 Pneumococcal vaccine: Completed series Tdap vaccine: Up to date, completed 09/19/2009 Shingles vaccine: discussed    Advanced directives: Advance directive discussed with you today. Even though you declined this today please call our office should you change your mind and we can give you the proper paperwork for you to fill out.  Conditions/risks identified: diabetes, hypertension, hyperlipidemia  Next appointment: 05/19/2019 @ 2:15 pm   Preventive Care 84 Years and Older, Male Preventive care refers to lifestyle choices and visits with your health care provider that can promote health and wellness. What does preventive care include?  A yearly physical exam. This is also called an annual well check.  Dental exams once or twice a year.  Routine eye exams. Ask your health care provider how often you should have your eyes checked.  Personal lifestyle choices, including:  Daily care of your teeth and gums.  Regular physical activity.  Eating a healthy diet.  Avoiding tobacco and drug use.  Limiting alcohol use.  Practicing safe sex.  Taking low doses of aspirin every day.  Taking vitamin and mineral supplements as recommended by your health care provider. What happens during an annual well check? The services and screenings done by your health care provider during your annual well check will depend on your age, overall health, lifestyle risk factors, and family history of  disease. Counseling  Your health care provider may ask you questions about your:  Alcohol use.  Tobacco use.  Drug use.  Emotional well-being.  Home and relationship well-being.  Sexual activity.  Eating habits.  History of falls.  Memory and ability to understand (cognition).  Work and work Statistician. Screening  You may have the following tests or measurements:  Height, weight, and BMI.  Blood pressure.  Lipid and cholesterol levels. These may be checked every 5 years, or more frequently if you are over 64 years old.  Skin check.  Lung cancer screening. You may have this screening every year starting at age 45 if you have a 30-pack-year history of smoking and currently smoke or have quit within the past 15 years.  Fecal occult blood test (FOBT) of the stool. You may have this test every year starting at age 71.  Flexible sigmoidoscopy or colonoscopy. You may have a sigmoidoscopy every 5 years or a colonoscopy every 10 years starting at age 41.  Prostate cancer screening. Recommendations will vary depending on your family history and other risks.  Hepatitis C blood test.  Hepatitis B blood test.  Sexually transmitted disease (STD) testing.  Diabetes screening. This is done by checking your blood sugar (glucose) after you have not eaten for a while (fasting). You may have this done every 1-3 years.  Abdominal aortic aneurysm (AAA) screening. You may need this if you are a current or former smoker.  Osteoporosis. You may be screened starting at age 89 if you are at high risk. Talk with your health care provider about your test results, treatment options, and if necessary, the need for more tests. Vaccines  Your health care provider  may recommend certain vaccines, such as:  Influenza vaccine. This is recommended every year.  Tetanus, diphtheria, and acellular pertussis (Tdap, Td) vaccine. You may need a Td booster every 10 years.  Zoster vaccine. You may  need this after age 39.  Pneumococcal 13-valent conjugate (PCV13) vaccine. One dose is recommended after age 56.  Pneumococcal polysaccharide (PPSV23) vaccine. One dose is recommended after age 45. Talk to your health care provider about which screenings and vaccines you need and how often you need them. This information is not intended to replace advice given to you by your health care provider. Make sure you discuss any questions you have with your health care provider. Document Released: 03/29/2015 Document Revised: 11/20/2015 Document Reviewed: 01/01/2015 Elsevier Interactive Patient Education  2017 [place] Prevention in the Home Falls can cause injuries. They can happen to people of all ages. There are many things you can do to make your home safe and to help prevent falls. What can I do on the outside of my home?  Regularly fix the edges of walkways and driveways and fix any cracks.  Remove anything that might make you trip as you walk through a door, such as a raised step or threshold.  Trim any bushes or trees on the path to your home.  Use bright outdoor lighting.  Clear any walking paths of anything that might make someone trip, such as rocks or tools.  Regularly check to see if handrails are loose or broken. Make sure that both sides of any steps have handrails.  Any raised decks and porches should have guardrails on the edges.  Have any leaves, snow, or ice cleared regularly.  Use sand or salt on walking paths during winter.  Clean up any spills in your garage right away. This includes oil or grease spills. What can I do in the bathroom?  Use night lights.  Install grab bars by the toilet and in the tub and shower. Do not use towel bars as grab bars.  Use non-skid mats or decals in the tub or shower.  If you need to sit down in the shower, use a plastic, non-slip stool.  Keep the floor dry. Clean up any water that spills on the floor as soon as it  happens.  Remove soap buildup in the tub or shower regularly.  Attach bath mats securely with double-sided non-slip rug tape.  Do not have throw rugs and other things on the floor that can make you trip. What can I do in the bedroom?  Use night lights.  Make sure that you have a light by your bed that is easy to reach.  Do not use any sheets or blankets that are too big for your bed. They should not hang down onto the floor.  Have a firm chair that has side arms. You can use this for support while you get dressed.  Do not have throw rugs and other things on the floor that can make you trip. What can I do in the kitchen?  Clean up any spills right away.  Avoid walking on wet floors.  Keep items that you use a lot in easy-to-reach places.  If you need to reach something above you, use a strong step stool that has a grab bar.  Keep electrical cords out of the way.  Do not use floor polish or wax that makes floors slippery. If you must use wax, use non-skid floor wax.  Do not have throw rugs  and other things on the floor that can make you trip. What can I do with my stairs?  Do not leave any items on the stairs.  Make sure that there are handrails on both sides of the stairs and use them. Fix handrails that are broken or loose. Make sure that handrails are as long as the stairways.  Check any carpeting to make sure that it is firmly attached to the stairs. Fix any carpet that is loose or worn.  Avoid having throw rugs at the top or bottom of the stairs. If you do have throw rugs, attach them to the floor with carpet tape.  Make sure that you have a light switch at the top of the stairs and the bottom of the stairs. If you do not have them, ask someone to add them for you. What else can I do to help prevent falls?  Wear shoes that:  Do not have high heels.  Have rubber bottoms.  Are comfortable and fit you well.  Are closed at the toe. Do not wear sandals.  If you  use a stepladder:  Make sure that it is fully opened. Do not climb a closed stepladder.  Make sure that both sides of the stepladder are locked into place.  Ask someone to hold it for you, if possible.  Clearly mark and make sure that you can see:  Any grab bars or handrails.  First and last steps.  Where the edge of each step is.  Use tools that help you move around (mobility aids) if they are needed. These include:  Canes.  Walkers.  Scooters.  Crutches.  Turn on the lights when you go into a dark area. Replace any light bulbs as soon as they burn out.  Set up your furniture so you have a clear path. Avoid moving your furniture around.  If any of your floors are uneven, fix them.  If there are any pets around you, be aware of where they are.  Review your medicines with your doctor. Some medicines can make you feel dizzy. This can increase your chance of falling. Ask your doctor what other things that you can do to help prevent falls. This information is not intended to replace advice given to you by your health care provider. Make sure you discuss any questions you have with your health care provider. Document Released: 12/27/2008 Document Revised: 08/08/2015 Document Reviewed: 04/06/2014 Elsevier Interactive Patient Education  2017 [employer].

## 2019-05-16 NOTE — Progress Notes (Signed)
PCP notes:  Health Maintenance: Eye exam- due, Patient will schedule appointment   Abnormal Screenings: none   Patient concerns: Discuss gout   Nurse concerns: none   Next PCP appt.: 05/19/2019 @ 2:15 pm

## 2019-05-16 NOTE — Telephone Encounter (Signed)
I could not order the B12.  The test is not covered for the diagnosis of fatigue.  If the patient wants to have it done he needs to sign a waiver as his insurance will not cover it.  Please check with patient at the lab visit let me know.

## 2019-05-17 ENCOUNTER — Encounter: Payer: Self-pay | Admitting: Family Medicine

## 2019-05-17 DIAGNOSIS — Z85828 Personal history of other malignant neoplasm of skin: Secondary | ICD-10-CM | POA: Diagnosis not present

## 2019-05-17 DIAGNOSIS — E538 Deficiency of other specified B group vitamins: Secondary | ICD-10-CM | POA: Insufficient documentation

## 2019-05-17 DIAGNOSIS — L738 Other specified follicular disorders: Secondary | ICD-10-CM | POA: Diagnosis not present

## 2019-05-17 DIAGNOSIS — D0462 Carcinoma in situ of skin of left upper limb, including shoulder: Secondary | ICD-10-CM | POA: Diagnosis not present

## 2019-05-17 DIAGNOSIS — L570 Actinic keratosis: Secondary | ICD-10-CM | POA: Diagnosis not present

## 2019-05-17 DIAGNOSIS — L814 Other melanin hyperpigmentation: Secondary | ICD-10-CM | POA: Diagnosis not present

## 2019-05-17 DIAGNOSIS — D225 Melanocytic nevi of trunk: Secondary | ICD-10-CM | POA: Diagnosis not present

## 2019-05-17 DIAGNOSIS — D485 Neoplasm of uncertain behavior of skin: Secondary | ICD-10-CM | POA: Diagnosis not present

## 2019-05-17 DIAGNOSIS — L821 Other seborrheic keratosis: Secondary | ICD-10-CM | POA: Diagnosis not present

## 2019-05-17 DIAGNOSIS — S60561A Insect bite (nonvenomous) of right hand, initial encounter: Secondary | ICD-10-CM | POA: Diagnosis not present

## 2019-05-18 ENCOUNTER — Encounter: Payer: Self-pay | Admitting: Orthopedic Surgery

## 2019-05-18 NOTE — Progress Notes (Signed)
Office Visit Note   Patient: [name]           Date of Birth: 04-23-1930           MRN: [id number] Visit Date: 05/15/2019              Requested by: Tonia Ghent, MD [address] [place],  [place] [address] PCP: Tonia Ghent, MD  Chief Complaint  Patient presents with  . Right Foot - Pain  . Left Foot - Pain      HPI: Patient is an 86 year old gentleman who presents in follow-up for the right foot status post right second toe amputation.  Patient states he also has a new problem with the left foot with the great toe rubbing on the second toe causing a pressure area.  Patient does have a history of gout and states that he has expressed some white drainage from the left foot second toe.  Assessment & Plan: Visit Diagnoses:  1. Osteomyelitis of second toe of right foot ([place])     Plan: Will repeat a uric acid.  He is currently taking 1 allopurinol a day occasionally taking a colchicine.  Recommend that he take the allopurinol twice a day this was called into his pharmacy.  Follow-Up Instructions: Return in about 3 weeks (around 06/05/2019).   Ortho Exam  Patient is alert, oriented, no adenopathy, well-dressed, normal affect, normal respiratory effort. Examination patient has good pulses.  There is some tophaceous deposits of the second toe left foot.  There is also some pressure area between the toes and patient was given a pad to place between the toes to relieve the pressure.  There is no ascending cellulitis no signs of infection.  Patient's repeat uric acid today was 7.4.  Imaging: No results found. No images are attached to the encounter.  Labs: Lab Results  Component Value Date   HGBA1C 6.8 (H) 05/16/2019   HGBA1C 6.7 (A) 12/23/2018   HGBA1C 6.7 (H) 11/04/2017   ESRSEDRATE 17 (H) 09/09/2017   CRP 1.1 (H) 09/09/2017   LABURIC 7.4 05/15/2019   LABURIC 7.7 04/17/2019   LABURIC 7.3 03/27/2019   REPTSTATUS 09/19/2017 FINAL 09/14/2017   GRAMSTAIN  09/10/2017    MODERATE WBC PRESENT,BOTH PMN AND MONONUCLEAR NO ORGANISMS SEEN    CULT  09/14/2017    NO GROWTH 5 DAYS Performed at [facility], [place] [address], [place], [place] [address]      Lab Results  Component Value Date   ALBUMIN 4.2 05/16/2019   ALBUMIN 3.2 (L) 09/14/2017   ALBUMIN 4.3 09/09/2017   LABURIC 7.4 05/15/2019   LABURIC 7.7 04/17/2019   LABURIC 7.3 03/27/2019    Lab Results  Component Value Date   MG 2.2 09/09/2017   No results found for: VD25OH  No results found for: PREALBUMIN CBC EXTENDED Latest Ref Rng & Units 05/16/2019 03/03/2019 07/09/2018  WBC 4.0 - 10.5 K/uL 6.1 6.0 7.7  RBC 4.22 - 5.81 Mil/uL 5.28 5.24 5.35  HGB 13.0 - 17.0 g/dL 15.8 15.7 15.4  HCT 39.0 - 52.0 % 46.4 48.2 47.3  PLT 150.0 - 400.0 K/uL 106.0(L) 153 154  NEUTROABS 1.4 - 7.7 K/uL 3.5 - 5.5  LYMPHSABS 0.7 - 4.0 K/uL 1.6 - 1.3     There is no height or weight on file to calculate BMI.  Orders:  Orders Placed This Encounter  Procedures  . Uric acid   Meds ordered this encounter  Medications  .  allopurinol (ZYLOPRIM) 100 MG tablet    Sig: Take 1 tablet (100 mg total) by mouth 2 (two) times daily.    Dispense:  60 tablet    Refill:  3     Procedures: No procedures performed  Clinical Data: No additional findings.  ROS:  All other systems negative, except as noted in the HPI. Review of Systems  Objective: Vital Signs: There were no vitals taken for this visit.  Specialty Comments:  No specialty comments available.  PMFS History: Patient Active Problem List   Diagnosis Date Noted  . B12 deficiency 05/17/2019  . Skin irritation 04/02/2019  . Osteomyelitis of second toe of right foot ([place])   . Acute non-recurrent maxillary sinusitis 05/25/2018  . Achilles tendinitis 12/05/2017  . Idiopathic chronic gout, unspecified site, without tophus (tophi) 10/06/2017  . Cutaneous abscess of right foot   . Atypical angina ([place]) 11/08/2015  . Acute  diastolic heart failure ([place])   . CAD S/P percutaneous coronary angioplasty   . Chronic atrial fibrillation ([place])   . CKD (chronic kidney disease), stage III   . Persistent atrial fibrillation ([place])   . Hypertension   . Hyperlipidemia   . Bradycardia   . Back pain 05/24/2015  . Triceps tendonitis 05/24/2015  . Diabetes mellitus without complication ([place]) 42/59/5638  . Dysphagia, pharyngoesophageal phase 09/25/2014  . History of esophageal stricture 09/25/2014  . Chronic anticoagulation 09/25/2014  . Helicobacter pylori duodenitis 09/25/2014  . Cough 03/14/2014  . Benign paroxysmal positional vertigo 03/14/2014  . Encounter for therapeutic drug monitoring 04/12/2013  . Esophageal stricture from GERD 06/08/2011  . Advance care planning 04/20/2011  . OSA on CPAP 09/22/2010  . HYPERCHOLESTEROLEMIA 08/15/2008  . Gout 08/15/2008  . OBESITY 08/15/2008  . Essential hypertension 08/15/2008  . CAD (coronary artery disease), native coronary artery 08/15/2008  . GASTROESOPHAGEAL REFLUX DISEASE 08/15/2008   Past Medical History:  Diagnosis Date  . Bradycardia    a. nocturnal with pauses overnight on tele likely due to OSA 10/2015.  [name] Cancer ([place])    skin cancer  . CKD (chronic kidney disease), stage III   . Coronary artery disease    a. prev nonobst. b. LHC 10/24/15: occlusion of small diffusely diseased OM2 s/p balloon angioplasty, mild nonobstructive disease of mLAD and mRCA, normal LVEDP  . Diabetes mellitus without complication ([place])   . Diverticulosis   . Esophageal reflux   . Esophageal stricture from GERD 06/08/2011   With stricture at GE junction on EGD, dilated 06/2011   . Gastric ulcer    on EGD 2013  . Gout, unspecified    "on daily RX" (09/09/2017)  . History of blood transfusion 02/1964   "when I had right little finger cut off"  . Hyperlipidemia   . Hypertension   . Obesity, unspecified   . Orthopnea   . OSA on CPAP   . Paroxysmal nocturnal dyspnea   . Persistent  atrial fibrillation ([place])   . Vertigo     Family History  Problem Relation Age of Onset  . Stomach cancer Mother        died in 50's   . Hypertension Father        died in his 49's pna likely dementia  . Dementia Father   . Pneumonia Father   . Heart disease Brother        S/P CABG  . Hypertension Other   . Prostate cancer Neg Hx   . Colon cancer Neg Hx  Past Surgical History:  Procedure Laterality Date  . AMPUTATION Right 03/03/2019   Procedure: RIGHT 2ND TOE AMPUTATION;  Surgeon: [name], MD;  Location: [facility];  Service: Orthopedics;  Laterality: Right;  . BALLOON DILATION N/A 10/22/2014   Procedure: BALLOON DILATION;  Surgeon: [name], MD;  Location: [facility];  Service: Endoscopy;  Laterality: N/A;  . CARDIAC CATHETERIZATION  07/2008  . CARDIAC CATHETERIZATION N/A 10/24/2015   Procedure: Left Heart Cath and Coronary Angiography;  Surgeon: [name], MD;  Location: [facility];  Service: Cardiovascular;  Laterality: N/A;  . CARDIAC CATHETERIZATION N/A 10/24/2015   Procedure: Coronary Balloon Angioplasty;  Surgeon: [name], MD;  Location: [facility];  Service: Cardiovascular;  Laterality: N/A;  . Carotid Dopplers  09/2009   no sig extracranial stenosis and vertebral arteries had antegrade flow  . CATARACT EXTRACTION W/ INTRAOCULAR LENS  IMPLANT, BILATERAL Bilateral   . ESOPHAGOGASTRODUODENOSCOPY (EGD) WITH ESOPHAGEAL DILATION     "I've had it stretched 3 times" (10/23/2015)  . ESOPHAGOGASTRODUODENOSCOPY (EGD) WITH ESOPHAGEAL DILATION  06/2011   /medical hx above  (09/09/2017)  . ESOPHAGOGASTRODUODENOSCOPY (EGD) WITH PROPOFOL N/A 10/22/2014   Procedure: ESOPHAGOGASTRODUODENOSCOPY (EGD) WITH PROPOFOL;  Surgeon: [name], MD;  Location: [facility];  Service: Endoscopy;  Laterality: N/A;  . FINGER SURGERY Right 02/1964   "cut little finger off; had it reattached"  . I & D EXTREMITY Right 09/10/2017   Procedure: RIGHT FOOT DEBRIDEMENT;   Surgeon: [name], MD;  Location: [facility];  Service: Orthopedics;  Laterality: Right;  . MRI of brain  09/2009   chronic microvascular ischemia  . OSA Sleep Study     per Dr. Brett Fairy  . SHOULDER ARTHROSCOPY W/ ROTATOR CUFF REPAIR Right 2008   Social History   Occupational History  . 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 Smoking status: Former Smoker    Packs/day: 0.12    Years: 4.00    Pack years: 0.48    Types: Cigarettes    Quit date: 03/16/1969    Years since quitting: 50.2  . Smokeless tobacco: Former Systems developer  . Tobacco comment: "chewed when I smoked; smoked 1 pack/week for about 4 years, quit 1971  Substance and Sexual Activity  . Alcohol use: Not Currently    Alcohol/week: 0.0 standard drinks    Comment: 09/09/2017  "Quit all alcohol in 1970's"  . Drug use: Never  . Sexual activity: Not on file

## 2019-05-19 ENCOUNTER — Other Ambulatory Visit: Payer: Self-pay

## 2019-05-19 ENCOUNTER — Ambulatory Visit (INDEPENDENT_AMBULATORY_CARE_PROVIDER_SITE_OTHER): Payer: Medicare Other | Admitting: Family Medicine

## 2019-05-19 ENCOUNTER — Encounter: Payer: Self-pay | Admitting: Family Medicine

## 2019-05-19 VITALS — BP 122/60 | HR 71 | Temp 97.3°F | Ht 66.0 in | Wt 218.5 lb

## 2019-05-19 DIAGNOSIS — Z9989 Dependence on other enabling machines and devices: Secondary | ICD-10-CM

## 2019-05-19 DIAGNOSIS — I2510 Atherosclerotic heart disease of native coronary artery without angina pectoris: Secondary | ICD-10-CM | POA: Diagnosis not present

## 2019-05-19 DIAGNOSIS — G4733 Obstructive sleep apnea (adult) (pediatric): Secondary | ICD-10-CM

## 2019-05-19 DIAGNOSIS — E538 Deficiency of other specified B group vitamins: Secondary | ICD-10-CM | POA: Diagnosis not present

## 2019-05-19 DIAGNOSIS — Z0000 Encounter for general adult medical examination without abnormal findings: Secondary | ICD-10-CM

## 2019-05-19 DIAGNOSIS — M109 Gout, unspecified: Secondary | ICD-10-CM

## 2019-05-19 DIAGNOSIS — I10 Essential (primary) hypertension: Secondary | ICD-10-CM | POA: Diagnosis not present

## 2019-05-19 DIAGNOSIS — I482 Chronic atrial fibrillation, unspecified: Secondary | ICD-10-CM

## 2019-05-19 DIAGNOSIS — Z7189 Other specified counseling: Secondary | ICD-10-CM

## 2019-05-19 DIAGNOSIS — E119 Type 2 diabetes mellitus without complications: Secondary | ICD-10-CM | POA: Diagnosis not present

## 2019-05-19 MED ORDER — COLCHICINE 0.6 MG PO TABS: 0.6000 mg | ORAL_TABLET | Freq: Two times a day (BID) | ORAL | 1 refills | Status: DC | PRN

## 2019-05-19 MED ORDER — "BD INSULIN SYRINGE 25G X 1"" 1 ML MISC": 1 refills | Status: DC

## 2019-05-19 MED ORDER — CYANOCOBALAMIN 1000 MCG/ML IJ SOLN
1000.0000 ug | Freq: Once | INTRAMUSCULAR | Status: AC
  Administered 2019-05-19: 1000 ug via INTRAMUSCULAR

## 2019-05-19 MED ORDER — CYANOCOBALAMIN 1000 MCG/ML IJ SOLN: INTRAMUSCULAR | 2 refills | Status: DC

## 2019-05-19 NOTE — Progress Notes (Signed)
This visit occurred during the SARS-CoV-2 public health emergency.  Safety protocols were in place, including screening questions prior to the visit, additional usage of staff PPE, and extensive cleaning of exam room while observing appropriate contact time as indicated for disinfecting solutions.  covid vaccine done.  Flu 2020 PNA up to date.  Tetanus 2011 Shingles d/w pt.  PSA and colon CA screening deferred.    Advance directive d/w pt.  Daughters Izora Gala and [place] equally designated if patient were incapacitated.    Diabetes:  No meds.   Hypoglycemic episodes: no Hyperglycemic episodes: no  Feet problems: no amputation healed.   Blood Sugars averaging: checked occ, ~115 in the AMs.  All below 150.  eye exam within last year: f/u pending.  A1c d/w pt at OV.  Statin intolerant.    Hypertension:    Using medication without problems or lightheadedness: yes Chest pain with exertion:no Edema:no Short of breath:no No bleeding.  Still anticoagulated.  No heart racing.    Still sleeping with CPAP.  Compliant.  Sleeping better with use.    B12 def.  He has some tingling in the B feet and hands.  1st dose given today, he can get family to give IM injection at home. B12 deficiency/pathophysiology discussed with patient. Rationale for replacement discussed with patient.  Gout per ortho with recent uric acid elevation per patient report at ortho office.  He has been put on higher dose of allopurinol, med list up to day, 200 a day.  Colchicine used if needed.    PMH and SH reviewed  Meds, vitals, and allergies reviewed.   ROS: Per HPI unless specifically indicated in ROS section   GEN: nad, alert and oriented HEENT: ncat NECK: supple w/o LA CV: IRR, not tachy, murmur noted. Old finding. PULM: ctab, no inc wob ABD: soft, +bs EXT: no edema SKIN: no acute rash  Diabetic foot exam: Normal inspection, except for right second toe absent. Well-healed amputation site. No skin  breakdown No calluses  Normal DP pulses Normal sensation to light touch and monofilament Nails normal

## 2019-05-19 NOTE — Patient Instructions (Addendum)
B12 shot today.  Repeat weekly for 3 more doses.  Give the shot in the muscle in the upper arm.    Then starting on the next dose, take a B12 injection monthly.   Recheck labs in about 3 months, prior to that month's B12 shot.  Please schedule a lab visit when possible.   If you have trouble getting a B12 shot at home, then call the clinic for a nurse visit.   We'll recheck A1c and B12 and platelet level with next set of labs.  Take care.  Glad to see you.

## 2019-05-22 ENCOUNTER — Telehealth: Payer: Self-pay | Admitting: Family Medicine

## 2019-05-22 DIAGNOSIS — Z0000 Encounter for general adult medical examination without abnormal findings: Secondary | ICD-10-CM | POA: Insufficient documentation

## 2019-05-22 NOTE — Assessment & Plan Note (Signed)
No bleeding.  Still anticoagulated.  No heart racing.   Would continue as is. He agrees.

## 2019-05-22 NOTE — Assessment & Plan Note (Signed)
covid vaccine done.  Flu 2020 PNA up to date.  Tetanus 2011 Shingles d/w pt.  PSA and colon CA screening deferred.    Advance directive d/w pt.  Daughters Izora Gala and [place] equally designated if patient were incapacitated.

## 2019-05-22 NOTE — Assessment & Plan Note (Signed)
Advance directive d/w pt.  Daughters Izora Gala and [place] equally designated if patient were incapacitated.

## 2019-05-22 NOTE — Assessment & Plan Note (Signed)
Still sleeping with CPAP.  Compliant.  Sleeping better with use.   Continue as is. He agrees.

## 2019-05-22 NOTE — Assessment & Plan Note (Signed)
He has some tingling in the B feet and hands. Discussed replacement. 1st dose given today, he can get family to give IM injection at home.   He will update me as needed. Recheck B12 as a trough level in a few months. See after visit summary.

## 2019-05-22 NOTE — Assessment & Plan Note (Signed)
Blood Sugars averaging: checked occ, ~115 in the AMs.  All below 150.  eye exam within last year: f/u pending.  A1c d/w pt at OV.  Statin intolerant.    No change in meds at this point. He agrees. Recheck periodically.

## 2019-05-22 NOTE — Telephone Encounter (Signed)
Will hold message for Lugene's return tomorrow to advise

## 2019-05-22 NOTE — Telephone Encounter (Signed)
Tabitha called   Pt needs to get a b12 inj  on Friday   Is it ok to schedule and mark see lugene She stated pt needs 4-5 weeks of b12  Weekly. Friday ok and mark see lugene Advise how many weeks he needs Needs to be after  12.

## 2019-05-22 NOTE — Assessment & Plan Note (Signed)
Gout per ortho with recent uric acid elevation per patient report at ortho office.  He has been put on higher dose of allopurinol, med list up to day, 200 a day.  Colchicine used if needed.   I will defer. He agrees.

## 2019-05-23 NOTE — Telephone Encounter (Signed)
Even though it was given last week on Friday, this does not have to be on Friday, Thursday is fine.

## 2019-05-23 NOTE — Telephone Encounter (Signed)
Appointment 3/12 

## 2019-05-23 NOTE — Telephone Encounter (Signed)
Left message asking [name] to call office

## 2019-05-25 ENCOUNTER — Other Ambulatory Visit: Payer: Self-pay

## 2019-05-25 ENCOUNTER — Ambulatory Visit (INDEPENDENT_AMBULATORY_CARE_PROVIDER_SITE_OTHER): Payer: Medicare Other

## 2019-05-25 DIAGNOSIS — I4819 Other persistent atrial fibrillation: Secondary | ICD-10-CM

## 2019-05-25 DIAGNOSIS — Z5181 Encounter for therapeutic drug level monitoring: Secondary | ICD-10-CM

## 2019-05-25 LAB — POCT INR: INR: 2.1 (ref 2.0–3.0)

## 2019-05-25 NOTE — Patient Instructions (Signed)
Description   Continue on same dosage 1 tablet daily except 1.5 tablets each Sundays and Thursdays. Recheck INR in 6 weeks. [facility] [phone]

## 2019-05-26 ENCOUNTER — Ambulatory Visit (INDEPENDENT_AMBULATORY_CARE_PROVIDER_SITE_OTHER): Payer: Medicare Other | Admitting: *Deleted

## 2019-05-26 DIAGNOSIS — H04203 Unspecified epiphora, bilateral lacrimal glands: Secondary | ICD-10-CM | POA: Diagnosis not present

## 2019-05-26 DIAGNOSIS — H52223 Regular astigmatism, bilateral: Secondary | ICD-10-CM | POA: Diagnosis not present

## 2019-05-26 DIAGNOSIS — Z961 Presence of intraocular lens: Secondary | ICD-10-CM | POA: Diagnosis not present

## 2019-05-26 DIAGNOSIS — E538 Deficiency of other specified B group vitamins: Secondary | ICD-10-CM

## 2019-05-26 DIAGNOSIS — H524 Presbyopia: Secondary | ICD-10-CM | POA: Diagnosis not present

## 2019-05-26 DIAGNOSIS — Z9849 Cataract extraction status, unspecified eye: Secondary | ICD-10-CM | POA: Diagnosis not present

## 2019-05-26 DIAGNOSIS — H1045 Other chronic allergic conjunctivitis: Secondary | ICD-10-CM | POA: Diagnosis not present

## 2019-05-26 DIAGNOSIS — H5203 Hypermetropia, bilateral: Secondary | ICD-10-CM | POA: Diagnosis not present

## 2019-05-26 LAB — HM DIABETES EYE EXAM

## 2019-05-26 MED ORDER — CYANOCOBALAMIN 1000 MCG/ML IJ SOLN
1000.0000 ug | Freq: Once | INTRAMUSCULAR | Status: AC
  Administered 2019-05-26: 1000 ug via INTRAMUSCULAR

## 2019-06-01 ENCOUNTER — Encounter: Payer: Self-pay | Admitting: Physician Assistant

## 2019-06-01 ENCOUNTER — Telehealth: Payer: Self-pay

## 2019-06-01 ENCOUNTER — Ambulatory Visit (INDEPENDENT_AMBULATORY_CARE_PROVIDER_SITE_OTHER): Payer: Medicare Other | Admitting: Physician Assistant

## 2019-06-01 ENCOUNTER — Ambulatory Visit (INDEPENDENT_AMBULATORY_CARE_PROVIDER_SITE_OTHER): Payer: Medicare Other | Admitting: *Deleted

## 2019-06-01 ENCOUNTER — Other Ambulatory Visit: Payer: Self-pay

## 2019-06-01 VITALS — Ht 66.0 in | Wt 218.0 lb

## 2019-06-01 DIAGNOSIS — S90822A Blister (nonthermal), left foot, initial encounter: Secondary | ICD-10-CM

## 2019-06-01 DIAGNOSIS — T148XXA Other injury of unspecified body region, initial encounter: Secondary | ICD-10-CM | POA: Diagnosis not present

## 2019-06-01 DIAGNOSIS — E538 Deficiency of other specified B group vitamins: Secondary | ICD-10-CM

## 2019-06-01 MED ORDER — CYANOCOBALAMIN 1000 MCG/ML IJ SOLN
1000.0000 ug | Freq: Once | INTRAMUSCULAR | Status: AC
  Administered 2019-06-01: 14:00:00 1000 ug via INTRAMUSCULAR

## 2019-06-01 NOTE — Progress Notes (Signed)
Office Visit Note   Patient: [name]           Date of Birth: 16-Sep-1930           MRN: [id number] Visit Date: 06/01/2019              Requested by: Tonia Ghent, MD [address] [place],  [place] [address] PCP: Tonia Ghent, MD  Chief Complaint  Patient presents with  . Left Foot - Follow-up      HPI: This is a pleasant 86 year old gentleman who comes in today to have a blister evaluated on his toe.  His daughter thought it was a blood blister but given his history just asked if we could take a look at it.  He is unsure how he got it.  He is on blood thinners.  Assessment & Plan: Visit Diagnoses: No diagnosis found.  Plan: I spoke with his daughter by phone as well as him.  I explained that this is a resolving blood blister.  I did take off the more coagulated area.  I told her if he has developed any raw areas she could apply some antibiotic ointment and a Band-Aid.  He is following up anyway in 2 weeks for a uric acid draw.  We will reevaluate the toe at that time  Follow-Up Instructions: No follow-ups on file.   Ortho Exam  Patient is alert, oriented, no adenopathy, well-dressed, normal affect, normal respiratory effort. Focused examination of the left foot second toe on the plantar surface demonstrates a small blood blister.  There is no surrounding erythema there is no drainage there is no foul odor no cellulitis.  After obtaining verbal consent I did debride the blister to a healthy skin surface.  He had no bleeding  Imaging: No results found. No images are attached to the encounter.  Labs: Lab Results  Component Value Date   HGBA1C 6.8 (H) 05/16/2019   HGBA1C 6.7 (A) 12/23/2018   HGBA1C 6.7 (H) 11/04/2017   ESRSEDRATE 17 (H) 09/09/2017   CRP 1.1 (H) 09/09/2017   LABURIC 7.4 05/15/2019   LABURIC 7.7 04/17/2019   LABURIC 7.3 03/27/2019   REPTSTATUS 09/19/2017 FINAL 09/14/2017   GRAMSTAIN  09/10/2017    MODERATE WBC PRESENT,BOTH PMN AND  MONONUCLEAR NO ORGANISMS SEEN    CULT  09/14/2017    NO GROWTH 5 DAYS Performed at [facility], [employer] [address] [place], [place] [address]      Lab Results  Component Value Date   ALBUMIN 4.2 05/16/2019   ALBUMIN 3.2 (L) 09/14/2017   ALBUMIN 4.3 09/09/2017   LABURIC 7.4 05/15/2019   LABURIC 7.7 04/17/2019   LABURIC 7.3 03/27/2019    Lab Results  Component Value Date   MG 2.2 09/09/2017   No results found for: VD25OH  No results found for: PREALBUMIN CBC EXTENDED Latest Ref Rng & Units 05/16/2019 03/03/2019 07/09/2018  WBC 4.0 - 10.5 K/uL 6.1 6.0 7.7  RBC 4.22 - 5.81 Mil/uL 5.28 5.24 5.35  HGB 13.0 - 17.0 g/dL 15.8 15.7 15.4  HCT 39.0 - 52.0 % 46.4 48.2 47.3  PLT 150.0 - 400.0 K/uL 106.0(L) 153 154  NEUTROABS 1.4 - 7.7 K/uL 3.5 - 5.5  LYMPHSABS 0.7 - 4.0 K/uL 1.6 - 1.3     Body mass index is 35.19 kg/m.  Orders:  No orders of the defined types were placed in this encounter.  No orders of the defined types were placed in this  encounter.    Procedures: No procedures performed  Clinical Data: No additional findings.  ROS:  All other systems negative, except as noted in the HPI. Review of Systems  Objective: Vital Signs: Ht 5\' 6"  (1.676 m)   Wt 218 lb (98.9 kg)   BMI 35.19 kg/m   Specialty Comments:  No specialty comments available.  PMFS History: Patient Active Problem List   Diagnosis Date Noted  . Healthcare maintenance 05/22/2019  . B12 deficiency 05/17/2019  . Skin irritation 04/02/2019  . Osteomyelitis of second toe of right foot ([place])   . Achilles tendinitis 12/05/2017  . Idiopathic chronic gout, unspecified site, without tophus (tophi) 10/06/2017  . Cutaneous abscess of right foot   . Atypical angina ([place]) 11/08/2015  . Acute diastolic heart failure ([place])   . CAD S/P percutaneous coronary angioplasty   . Chronic atrial fibrillation ([place])   . CKD (chronic kidney disease), stage III   . Persistent atrial fibrillation ([place])   .  Hypertension   . Hyperlipidemia   . Bradycardia   . Back pain 05/24/2015  . Triceps tendonitis 05/24/2015  . Diabetes mellitus without complication ([place]) 22/97/9892  . Dysphagia, pharyngoesophageal phase 09/25/2014  . History of esophageal stricture 09/25/2014  . Chronic anticoagulation 09/25/2014  . Helicobacter pylori duodenitis 09/25/2014  . Cough 03/14/2014  . Benign paroxysmal positional vertigo 03/14/2014  . Encounter for therapeutic drug monitoring 04/12/2013  . Esophageal stricture from GERD 06/08/2011  . Advance care planning 04/20/2011  . OSA on CPAP 09/22/2010  . HYPERCHOLESTEROLEMIA 08/15/2008  . Gout 08/15/2008  . OBESITY 08/15/2008  . Essential hypertension 08/15/2008  . CAD (coronary artery disease), native coronary artery 08/15/2008  . GASTROESOPHAGEAL REFLUX DISEASE 08/15/2008   Past Medical History:  Diagnosis Date  . Bradycardia    a. nocturnal with pauses overnight on tele likely due to OSA 10/2015.  [name] Cancer ([place])    skin cancer  . CKD (chronic kidney disease), stage III   . Coronary artery disease    a. prev nonobst. b. LHC 10/24/15: occlusion of small diffusely diseased OM2 s/p balloon angioplasty, mild nonobstructive disease of mLAD and mRCA, normal LVEDP  . Diabetes mellitus without complication ([place])   . Diverticulosis   . Esophageal reflux   . Esophageal stricture from GERD 06/08/2011   With stricture at GE junction on EGD, dilated 06/2011   . Gastric ulcer    on EGD 2013  . Gout, unspecified    "on daily RX" (09/09/2017)  . History of blood transfusion 02/1964   "when I had right little finger cut off"  . Hyperlipidemia   . Hypertension   . Obesity, unspecified   . Orthopnea   . OSA on CPAP   . Paroxysmal nocturnal dyspnea   . Persistent atrial fibrillation ([place])   . Vertigo     Family History  Problem Relation Age of Onset  . Stomach cancer Mother        died in 37's   . Hypertension Father        died in his 34's pna likely dementia    . Dementia Father   . Pneumonia Father   . Heart disease Brother        S/P CABG  . Hypertension Other   . Prostate cancer Neg Hx   . Colon cancer Neg Hx     Past Surgical History:  Procedure Laterality Date  . AMPUTATION Right 03/03/2019   Procedure: RIGHT 2ND TOE AMPUTATION;  Surgeon: [name],  MD;  Location: [facility];  Service: Orthopedics;  Laterality: Right;  . BALLOON DILATION N/A 10/22/2014   Procedure: BALLOON DILATION;  Surgeon: [name], MD;  [facility];  Service: Endoscopy;  Laterality: N/A;  . CARDIAC CATHETERIZATION  07/2008  . CARDIAC CATHETERIZATION N/A 10/24/2015   Procedure: Left Heart Cath and Coronary Angiography;  Surgeon: [name], MD;  Location: [facility];  Service: Cardiovascular;  Laterality: N/A;  . CARDIAC CATHETERIZATION N/A 10/24/2015   Procedure: Coronary Balloon Angioplasty;  Surgeon: [name], MD;  Location: [facility];  Service: Cardiovascular;  Laterality: N/A;  . Carotid Dopplers  09/2009   no sig extracranial stenosis and vertebral arteries had antegrade flow  . CATARACT EXTRACTION W/ INTRAOCULAR LENS  IMPLANT, BILATERAL Bilateral   . ESOPHAGOGASTRODUODENOSCOPY (EGD) WITH ESOPHAGEAL DILATION     "I've had it stretched 3 times" (10/23/2015)  . ESOPHAGOGASTRODUODENOSCOPY (EGD) WITH ESOPHAGEAL DILATION  06/2011   /medical hx above  (09/09/2017)  . ESOPHAGOGASTRODUODENOSCOPY (EGD) WITH PROPOFOL N/A 10/22/2014   Procedure: ESOPHAGOGASTRODUODENOSCOPY (EGD) WITH PROPOFOL;  Surgeon: [name], MD;  [facility];  Service: Endoscopy;  Laterality: N/A;  . FINGER SURGERY Right 02/1964   "cut little finger off; had it reattached"  . I & D EXTREMITY Right 09/10/2017   Procedure: RIGHT FOOT DEBRIDEMENT;  Surgeon: [name], MD;  Location: [facility];  Service: Orthopedics;  Laterality: Right;  . MRI of brain  09/2009   chronic microvascular ischemia  . OSA Sleep Study     per Dr. Brett Fairy  . SHOULDER  ARTHROSCOPY W/ ROTATOR CUFF REPAIR Right 2008   Social History   Occupational History  . 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 Smoking status: Former Smoker    Packs/day: 0.12    Years: 4.00    Pack years: 0.48    Types: Cigarettes    Quit date: 03/16/1969    Years since quitting: 50.2  . Smokeless tobacco: Former Systems developer  . Tobacco comment: "chewed when I smoked; smoked 1 pack/week for about 4 years, quit 1971  Substance and Sexual Activity  . Alcohol use: Not Currently    Alcohol/week: 0.0 standard drinks    Comment: 09/09/2017  "Quit all alcohol in 1970's"  . Drug use: Never  . Sexual activity: Not on file

## 2019-06-01 NOTE — Telephone Encounter (Signed)
Patient was worked in today

## 2019-06-01 NOTE — Progress Notes (Signed)
Per orders of Dr. Silvio Pate (for Dr Damita Dunnings), injection of b12 1028mcg/mL  given by [facility]. Patient tolerated injection well.

## 2019-06-08 ENCOUNTER — Ambulatory Visit (INDEPENDENT_AMBULATORY_CARE_PROVIDER_SITE_OTHER): Payer: Medicare Other

## 2019-06-08 ENCOUNTER — Other Ambulatory Visit: Payer: Self-pay

## 2019-06-08 DIAGNOSIS — E538 Deficiency of other specified B group vitamins: Secondary | ICD-10-CM | POA: Diagnosis not present

## 2019-06-08 MED ORDER — CYANOCOBALAMIN 1000 MCG/ML IJ SOLN
1000.0000 ug | Freq: Once | INTRAMUSCULAR | Status: AC
  Administered 2019-06-08: 1000 ug via INTRAMUSCULAR

## 2019-06-08 NOTE — Progress Notes (Signed)
4 weekly doses.          Then monthly.   Recheck labs in about 3 months, prior to that month's B12 shot.    #4 today. Per orders of Dr. Damita Dunnings, injection of B12 105mcg/ml given by Pilar Grammes, CMA in right deltoid. Patient tolerated injection well.  Advised pt to make his next 2 monthly appointments. Told him he would lab and nurse visit in June.  Forwarding to Dr Danise Mina in Dr Josefine Class absence

## 2019-06-12 DIAGNOSIS — Z85828 Personal history of other malignant neoplasm of skin: Secondary | ICD-10-CM | POA: Diagnosis not present

## 2019-06-12 DIAGNOSIS — L011 Impetiginization of other dermatoses: Secondary | ICD-10-CM | POA: Diagnosis not present

## 2019-06-12 DIAGNOSIS — L300 Nummular dermatitis: Secondary | ICD-10-CM | POA: Diagnosis not present

## 2019-06-13 ENCOUNTER — Ambulatory Visit (INDEPENDENT_AMBULATORY_CARE_PROVIDER_SITE_OTHER): Payer: Medicare Other | Admitting: Orthopedic Surgery

## 2019-06-13 ENCOUNTER — Other Ambulatory Visit: Payer: Self-pay

## 2019-06-13 ENCOUNTER — Encounter: Payer: Self-pay | Admitting: Orthopedic Surgery

## 2019-06-13 VITALS — Ht 66.0 in | Wt 218.0 lb

## 2019-06-13 DIAGNOSIS — M1A0710 Idiopathic chronic gout, right ankle and foot, without tophus (tophi): Secondary | ICD-10-CM | POA: Diagnosis not present

## 2019-06-13 DIAGNOSIS — M1A9XX1 Chronic gout, unspecified, with tophus (tophi): Secondary | ICD-10-CM

## 2019-06-13 DIAGNOSIS — I2510 Atherosclerotic heart disease of native coronary artery without angina pectoris: Secondary | ICD-10-CM

## 2019-06-13 NOTE — Telephone Encounter (Signed)
disregard

## 2019-06-14 ENCOUNTER — Telehealth: Payer: Self-pay | Admitting: Orthopedic Surgery

## 2019-06-14 LAB — URIC ACID: Uric Acid, Serum: 7.2 mg/dL (ref 4.0–8.0)

## 2019-06-14 NOTE — Progress Notes (Signed)
Office Visit Note   Patient: [name]           Date of Birth: 07-22-1930           MRN: [id number] Visit Date: 06/13/2019              Requested by: Tonia Ghent, MD [address] [place],  [place] [address] PCP: Tonia Ghent, MD  Chief Complaint  Patient presents with  . Right Foot - Follow-up  . Left Foot - Follow-up      HPI: The patient is an 86 year old gentleman seen today in routine follow-up for tophaceous gout.  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  He also had a blood blister recently to his left toe this has resolved.  He has not had any flares since his last visit here.  He has been taking his allopurinol daily.    Assessment & Plan: Visit Diagnoses:  1. Idiopathic chronic gout of right foot without tophus     Plan: Uric acid rechecked today.  We will call him and adjust his medications accordingly.  He will follow-up in the office in 6 months.   Follow-Up Instructions: No follow-ups on file.   Ortho Exam  Patient is alert, oriented, no adenopathy, well-dressed, normal affect, normal respiratory effort. On examination of his left foot the blood blister to his second toe has completely resolved.  To his right foot third toe he does have 2 tophi.  He states the lateral one opens and drains occasionally he has attempted to pop it and drain it several times in the past.  There is no purulence no surrounding erythema this does not probe to bone the wound is 4 mm in diameter.  And 2 mm deep. Imaging: No results found. No images are attached to the encounter.  Labs: Lab Results  Component Value Date   HGBA1C 6.8 (H) 05/16/2019   HGBA1C 6.7 (A) 12/23/2018   HGBA1C 6.7 (H) 11/04/2017   ESRSEDRATE 17 (H) 09/09/2017   CRP 1.1 (H) 09/09/2017   LABURIC 7.2 06/13/2019   LABURIC 7.4 05/15/2019   LABURIC 7.7 04/17/2019   REPTSTATUS 09/19/2017 FINAL 09/14/2017   GRAMSTAIN  09/10/2017    MODERATE WBC PRESENT,BOTH PMN AND MONONUCLEAR NO  ORGANISMS SEEN    CULT  09/14/2017    NO GROWTH 5 DAYS Performed at [facility], [place] [address], [place], [place] [address]      Lab Results  Component Value Date   ALBUMIN 4.2 05/16/2019   ALBUMIN 3.2 (L) 09/14/2017   ALBUMIN 4.3 09/09/2017   LABURIC 7.2 06/13/2019   LABURIC 7.4 05/15/2019   LABURIC 7.7 04/17/2019    Lab Results  Component Value Date   MG 2.2 09/09/2017   No results found for: VD25OH  No results found for: PREALBUMIN CBC EXTENDED Latest Ref Rng & Units 05/16/2019 03/03/2019 07/09/2018  WBC 4.0 - 10.5 K/uL 6.1 6.0 7.7  RBC 4.22 - 5.81 Mil/uL 5.28 5.24 5.35  HGB 13.0 - 17.0 g/dL 15.8 15.7 15.4  HCT 39.0 - 52.0 % 46.4 48.2 47.3  PLT 150.0 - 400.0 K/uL 106.0(L) 153 154  NEUTROABS 1.4 - 7.7 K/uL 3.5 - 5.5  LYMPHSABS 0.7 - 4.0 K/uL 1.6 - 1.3     Body mass index is 35.19 kg/m.  Orders:  Orders Placed This Encounter  Procedures  . Uric acid   No orders of the defined types were placed in this encounter.  Procedures: No procedures performed  Clinical Data: No additional findings.  ROS:  All other systems negative, except as noted in the HPI. Review of Systems  Constitutional: Negative for chills and fever.  Musculoskeletal: Negative for arthralgias and myalgias.  Skin: Negative for wound.    Objective: Vital Signs: Ht 5\' 6"  (1.676 m)   Wt 218 lb (98.9 kg)   BMI 35.19 kg/m   Specialty Comments:  No specialty comments available.  PMFS History: Patient Active Problem List   Diagnosis Date Noted  . Healthcare maintenance 05/22/2019  . B12 deficiency 05/17/2019  . Skin irritation 04/02/2019  . Osteomyelitis of second toe of right foot ([place])   . Achilles tendinitis 12/05/2017  . Idiopathic chronic gout, unspecified site, without tophus (tophi) 10/06/2017  . Cutaneous abscess of right foot   . Atypical angina ([place]) 11/08/2015  . Acute diastolic heart failure ([place])   . CAD S/P percutaneous coronary angioplasty   . Chronic  atrial fibrillation ([place])   . CKD (chronic kidney disease), stage III   . Persistent atrial fibrillation ([place])   . Hypertension   . Hyperlipidemia   . Bradycardia   . Back pain 05/24/2015  . Triceps tendonitis 05/24/2015  . Diabetes mellitus without complication ([place]) 09/32/6712  . Dysphagia, pharyngoesophageal phase 09/25/2014  . History of esophageal stricture 09/25/2014  . Chronic anticoagulation 09/25/2014  . Helicobacter pylori duodenitis 09/25/2014  . Cough 03/14/2014  . Benign paroxysmal positional vertigo 03/14/2014  . Encounter for therapeutic drug monitoring 04/12/2013  . Esophageal stricture from GERD 06/08/2011  . Advance care planning 04/20/2011  . OSA on CPAP 09/22/2010  . HYPERCHOLESTEROLEMIA 08/15/2008  . Gout 08/15/2008  . OBESITY 08/15/2008  . Essential hypertension 08/15/2008  . CAD (coronary artery disease), native coronary artery 08/15/2008  . GASTROESOPHAGEAL REFLUX DISEASE 08/15/2008   Past Medical History:  Diagnosis Date  . Bradycardia    a. nocturnal with pauses overnight on tele likely due to OSA 10/2015.  [name] Cancer ([place])    skin cancer  . CKD (chronic kidney disease), stage III   . Coronary artery disease    a. prev nonobst. b. LHC 10/24/15: occlusion of small diffusely diseased OM2 s/p balloon angioplasty, mild nonobstructive disease of mLAD and mRCA, normal LVEDP  . Diabetes mellitus without complication ([place])   . Diverticulosis   . Esophageal reflux   . Esophageal stricture from GERD 06/08/2011   With stricture at GE junction on EGD, dilated 06/2011   . Gastric ulcer    on EGD 2013  . Gout, unspecified    "on daily RX" (09/09/2017)  . History of blood transfusion 02/1964   "when I had right little finger cut off"  . Hyperlipidemia   . Hypertension   . Obesity, unspecified   . Orthopnea   . OSA on CPAP   . Paroxysmal nocturnal dyspnea   . Persistent atrial fibrillation ([place])   . Vertigo     Family History  Problem Relation Age of Onset    . Stomach cancer Mother        died in 65's   . Hypertension Father        died in his 67's pna likely dementia  . Dementia Father   . Pneumonia Father   . Heart disease Brother        S/P CABG  . Hypertension Other   . Prostate cancer Neg Hx   . Colon cancer Neg Hx     Past Surgical History:  Procedure Laterality Date  .  AMPUTATION Right 03/03/2019   Procedure: RIGHT 2ND TOE AMPUTATION;  Surgeon: [name], MD;  Location: [facility];  Service: Orthopedics;  Laterality: Right;  . BALLOON DILATION N/A 10/22/2014   Procedure: BALLOON DILATION;  Surgeon: [name], MD;  [facility];  Service: Endoscopy;  Laterality: N/A;  . CARDIAC CATHETERIZATION  07/2008  . CARDIAC CATHETERIZATION N/A 10/24/2015   Procedure: Left Heart Cath and Coronary Angiography;  Surgeon: [name], MD;  Location: [facility];  Service: Cardiovascular;  Laterality: N/A;  . CARDIAC CATHETERIZATION N/A 10/24/2015   Procedure: Coronary Balloon Angioplasty;  Surgeon: [name], MD;  Location: [facility];  Service: Cardiovascular;  Laterality: N/A;  . Carotid Dopplers  09/2009   no sig extracranial stenosis and vertebral arteries had antegrade flow  . CATARACT EXTRACTION W/ INTRAOCULAR LENS  IMPLANT, BILATERAL Bilateral   . ESOPHAGOGASTRODUODENOSCOPY (EGD) WITH ESOPHAGEAL DILATION     "I've had it stretched 3 times" (10/23/2015)  . ESOPHAGOGASTRODUODENOSCOPY (EGD) WITH ESOPHAGEAL DILATION  06/2011   /medical hx above  (09/09/2017)  . ESOPHAGOGASTRODUODENOSCOPY (EGD) WITH PROPOFOL N/A 10/22/2014   Procedure: ESOPHAGOGASTRODUODENOSCOPY (EGD) WITH PROPOFOL;  Surgeon: [name], MD;  [facility];  Service: Endoscopy;  Laterality: N/A;  . FINGER SURGERY Right 02/1964   "cut little finger off; had it reattached"  . I & D EXTREMITY Right 09/10/2017   Procedure: RIGHT FOOT DEBRIDEMENT;  Surgeon: [name], MD;  Location: [facility];  Service: Orthopedics;  Laterality: Right;   . MRI of brain  09/2009   chronic microvascular ischemia  . OSA Sleep Study     per Dr. Brett Fairy  . SHOULDER ARTHROSCOPY W/ ROTATOR CUFF REPAIR Right 2008   Social History   Occupational History  . 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 Smoking status: Former Smoker    Packs/day: 0.12    Years: 4.00    Pack years: 0.48    Types: Cigarettes    Quit date: 03/16/1969    Years since quitting: 50.2  . Smokeless tobacco: Former Systems developer  . Tobacco comment: "chewed when I smoked; smoked 1 pack/week for about 4 years, quit 1971  Substance and Sexual Activity  . Alcohol use: Not Currently    Alcohol/week: 0.0 standard drinks    Comment: 09/09/2017  "Quit all alcohol in 1970's"  . Drug use: Never  . Sexual activity: Not on file

## 2019-06-14 NOTE — Telephone Encounter (Signed)
I asked Bevely Palmer to call him about this

## 2019-06-14 NOTE — Telephone Encounter (Signed)
Patient called and stated that she wants to get the patient's lab results  Daughter Cassandria Santee called [phone]

## 2019-06-14 NOTE — Telephone Encounter (Signed)
Pt's uric acid level is a 7.2 taking allopurinol 100 mg BID calling for results and next steps please advise.

## 2019-06-15 DIAGNOSIS — M19012 Primary osteoarthritis, left shoulder: Secondary | ICD-10-CM | POA: Diagnosis not present

## 2019-06-15 DIAGNOSIS — M170 Bilateral primary osteoarthritis of knee: Secondary | ICD-10-CM | POA: Diagnosis not present

## 2019-06-15 NOTE — Telephone Encounter (Signed)
I was unable to reach patient. I spoke with patient's daughter and she states that Bevely Palmer did reach patient and advised to increase from two to three times daily.

## 2019-06-15 NOTE — Telephone Encounter (Signed)
I left message requesting return call to see if patient was contacted prior to Bevely Palmer being out of the office. Per Dr. Sharol Given, since lab is still elevated, he would like for patient to increase medication to three times daily. Will wait for return call to discuss.

## 2019-06-20 DIAGNOSIS — J189 Pneumonia, unspecified organism: Secondary | ICD-10-CM | POA: Diagnosis not present

## 2019-06-20 DIAGNOSIS — R350 Frequency of micturition: Secondary | ICD-10-CM | POA: Diagnosis not present

## 2019-06-30 DIAGNOSIS — M25562 Pain in left knee: Secondary | ICD-10-CM | POA: Diagnosis not present

## 2019-06-30 DIAGNOSIS — M25462 Effusion, left knee: Secondary | ICD-10-CM | POA: Diagnosis not present

## 2019-06-30 DIAGNOSIS — M1712 Unilateral primary osteoarthritis, left knee: Secondary | ICD-10-CM | POA: Diagnosis not present

## 2019-07-06 ENCOUNTER — Other Ambulatory Visit: Payer: Self-pay

## 2019-07-06 ENCOUNTER — Ambulatory Visit (INDEPENDENT_AMBULATORY_CARE_PROVIDER_SITE_OTHER): Payer: Medicare Other

## 2019-07-06 DIAGNOSIS — I4819 Other persistent atrial fibrillation: Secondary | ICD-10-CM | POA: Diagnosis not present

## 2019-07-06 DIAGNOSIS — Z5181 Encounter for therapeutic drug level monitoring: Secondary | ICD-10-CM

## 2019-07-06 DIAGNOSIS — M170 Bilateral primary osteoarthritis of knee: Secondary | ICD-10-CM | POA: Diagnosis not present

## 2019-07-06 LAB — POCT INR: INR: 3.6 — AB (ref 2.0–3.0)

## 2019-07-06 NOTE — Patient Instructions (Addendum)
Description   Skip tomorrow's dosage of Coumadin, then resume same dosage 1 tablet daily except 1.5 tablets each Sundays and Thursdays. Recheck INR in 3 weeks. [facility] [phone]

## 2019-07-11 ENCOUNTER — Ambulatory Visit (INDEPENDENT_AMBULATORY_CARE_PROVIDER_SITE_OTHER): Payer: Medicare Other

## 2019-07-11 ENCOUNTER — Other Ambulatory Visit: Payer: Self-pay

## 2019-07-11 DIAGNOSIS — E538 Deficiency of other specified B group vitamins: Secondary | ICD-10-CM | POA: Diagnosis not present

## 2019-07-11 MED ORDER — CYANOCOBALAMIN 1000 MCG/ML IJ SOLN
1000.0000 ug | Freq: Once | INTRAMUSCULAR | Status: AC
  Administered 2019-07-11: 1000 ug via INTRAMUSCULAR

## 2019-07-11 NOTE — Progress Notes (Signed)
Sent to Dr Danise Mina in Dr Josefine Class absence.  Pt given Monthly B12 in left deltoid. Pt tolerated well. Has next appt 08-16-19 for B12 level and next injection.

## 2019-07-13 DIAGNOSIS — M170 Bilateral primary osteoarthritis of knee: Secondary | ICD-10-CM | POA: Diagnosis not present

## 2019-07-14 ENCOUNTER — Other Ambulatory Visit: Payer: Self-pay | Admitting: *Deleted

## 2019-07-14 MED ORDER — COLCHICINE 0.6 MG PO CAPS: 1.0000 | ORAL_CAPSULE | Freq: Two times a day (BID) | ORAL | 1 refills | Status: DC | PRN

## 2019-07-17 ENCOUNTER — Telehealth: Payer: Self-pay

## 2019-07-17 DIAGNOSIS — J189 Pneumonia, unspecified organism: Secondary | ICD-10-CM

## 2019-07-17 NOTE — Telephone Encounter (Signed)
Pt's daughter, Lawerance Bach,  called reporting pt was seen about 3 wks ago and was diagnosed with pneumonia and prescribed a z-pack. She reports pt is doing better but UC said he should f/u to get x-ray and make sure all is clear. She denies pt has any covid symptoms and was tested 3 wks ago and was neg. Placed pt on schedule for Friday. Advised if pt develops any symptoms to contact office before coming to apt. Tabitha verbalized understanding.

## 2019-07-18 NOTE — Telephone Encounter (Signed)
Thanks.  I put in the order for the f/u CXR.  Please see if that can be done on the way back for the OV.

## 2019-07-20 ENCOUNTER — Encounter: Payer: Self-pay | Admitting: Family Medicine

## 2019-07-20 ENCOUNTER — Ambulatory Visit (INDEPENDENT_AMBULATORY_CARE_PROVIDER_SITE_OTHER): Payer: Medicare Other | Admitting: Family Medicine

## 2019-07-20 ENCOUNTER — Other Ambulatory Visit: Payer: Self-pay

## 2019-07-20 VITALS — BP 120/64 | HR 113 | Temp 98.6°F | Ht 66.0 in | Wt 217.2 lb

## 2019-07-20 DIAGNOSIS — M170 Bilateral primary osteoarthritis of knee: Secondary | ICD-10-CM | POA: Diagnosis not present

## 2019-07-20 DIAGNOSIS — T7840XA Allergy, unspecified, initial encounter: Secondary | ICD-10-CM | POA: Diagnosis not present

## 2019-07-20 DIAGNOSIS — I2510 Atherosclerotic heart disease of native coronary artery without angina pectoris: Secondary | ICD-10-CM | POA: Diagnosis not present

## 2019-07-20 MED ORDER — DEXAMETHASONE SODIUM PHOSPHATE 100 MG/10ML IJ SOLN
10.0000 mg | Freq: Once | INTRAMUSCULAR | Status: AC
  Administered 2019-07-20: 10 mg via INTRAMUSCULAR

## 2019-07-20 MED ORDER — TRIAMCINOLONE ACETONIDE 0.1 % EX CREA: 1.0000 "application " | TOPICAL_CREAM | Freq: Two times a day (BID) | CUTANEOUS | 1 refills | Status: DC

## 2019-07-20 NOTE — Progress Notes (Signed)
[name] T. Marko Skalski, MD, [name]  Primary Care and [place] at [facility] [place], [address]  Phone: [phone]  FAX: [phone]  [name] - 86 y.o. male  MRN [id number]  Date of Birth: 1930/05/21  Date: 07/20/2019  PCP: Tonia Ghent, MD  Referral: Tonia Ghent, MD  Chief Complaint  Patient presents with  . Rash    x 3 days-?reaction to strawberries    This visit occurred during the SARS-CoV-2 public health emergency.  Safety protocols were in place, including screening questions prior to the visit, additional usage of staff PPE, and extensive cleaning of exam room while observing appropriate contact time as indicated for disinfecting solutions.   Subjective:   [name] is a 86 y.o. very pleasant male patient with Body mass index is 35.07 kg/m. who presents with the following:  Rash: He is a very pleasant 86 year old gentleman. gentleman.  He had a whole lot of strawberries about 3 days ago and subsequently developed a rash on his shoulders, back, arms, and in his hypogastric region and around his upper legs.  This is highly pruritic.  He has been taking some Benadryl which is helped some.  He also has placed some triamcinolone as well as some Bactroban.  He has not had any other known exposures.  No new soap, lotion, other foods that he can recall.  Photo  Strawberry reaction  Review of Systems is noted in the HPI, as appropriate  Objective:   BP 120/64   Pulse (!) 113   Temp 98.6 F (37 C) (Temporal)   Ht 5\' 6"  (1.676 m)   Wt 217 lb 4 oz (98.5 kg)   SpO2 97%   BMI 35.07 kg/m   GEN: No acute distress; alert,appropriate. PULM: Breathing comfortably in no respiratory distress PSYCH: Normally interactive.       Laboratory and Imaging Data:  Assessment and Plan:     ICD-10-CM   1. Allergic rash present on examination  T78.40XA dexamethasone (DECADRON) injection 10 mg    Allergic dermatitis.  Treat as such.  Plus or minus strawberry reaction.  Follow-up: No follow-ups on file.  Meds ordered this encounter  Medications  . triamcinolone cream (KENALOG) 0.1 %    Sig: Apply 1 application topically 2 (two) times daily.    Dispense:  454 g    Refill:  1  . dexamethasone (DECADRON) injection 10 mg   Medications Discontinued During This Encounter  Medication Reason  . triamcinolone cream (KENALOG) 0.1 %    No orders of the defined types were placed in this encounter.   Signed,  Maud Deed. Kingslee Dowse, MD   Outpatient Encounter Medications as of 07/20/2019  Medication Sig  . acetaminophen (TYLENOL) 500 MG tablet Take 1,000 mg by mouth as needed for mild pain.  [name] allopurinol (ZYLOPRIM) 100 MG tablet Take 1 tablet (100 mg total) by mouth 2 (two) times daily.  . Colchicine (MITIGARE) 0.6 MG CAPS Take 1 tablet by mouth 2 (two) times daily as needed (Mitigare).  . cyanocobalamin (,VITAMIN B-12,) 1000 MCG/ML injection 1022mcg IM each week for 4 doses then once a month thereafter  . diclofenac Sodium (VOLTAREN) 1 % GEL Apply 1 application topically 4 (four) times daily as needed (pain).  [name] diltiazem (CARDIZEM CD) 180 MG 24 hr capsule TAKE 1 CAPSULE (180 MG TOTAL) BY MOUTH DAILY.  . diphenhydrAMINE (ALLERGY RELIEF) 25 MG tablet Take 25 mg by mouth  2 (two) times daily as needed.  . furosemide (LASIX) 20 MG tablet Take 1 tablet (20 mg total) by mouth daily. Please make yearly appt with Dr. Burt Knack for May before anymore refills. 1st attempt  . HYDROcodone-acetaminophen (NORCO/VICODIN) 5-325 MG tablet Take 1 tablet by mouth every 4 (four) hours as needed for moderate pain.  . Insulin Syringe-Needle U-100 (B-D INSULIN SYRINGE 1CC/25GX1") 25G X 1" 1 ML MISC Use with B12 shots  . lisinopril (ZESTRIL) 20 MG tablet Take 1 tablet (20 mg total) by mouth daily.  [name] loratadine (CLARITIN) 10 MG tablet Take 10 mg by mouth daily as needed for allergies.  . metoprolol tartrate  (LOPRESSOR) 25 MG tablet Take 0.5 tablets (12.5 mg total) by mouth 2 (two) times daily.  . mupirocin ointment (BACTROBAN) 2 % Place 1 application into the nose 2 (two) times daily.  . nitroGLYCERIN (NITROSTAT) 0.4 MG SL tablet Place 1 tablet (0.4 mg total) under the tongue every 5 (five) minutes x 3 doses as needed for chest pain.  [name] omeprazole (PRILOSEC) 40 MG capsule TAKE 1 CAPSULE BY MOUTH EVERY DAY  . warfarin (COUMADIN) 4 MG tablet TAKE AS DIRECTED BY COUMADIN CLINIC  . [DISCONTINUED] triamcinolone cream (KENALOG) 0.1 % APPLY TO AFFECTED AREA TWICE A DAY  . triamcinolone cream (KENALOG) 0.1 % Apply 1 application topically 2 (two) times daily.  . [EXPIRED] dexamethasone (DECADRON) injection 10 mg    No facility-administered encounter medications on file as of 07/20/2019.

## 2019-07-20 NOTE — Patient Instructions (Signed)
Allergy pills Take 1 in the morning 1 at lunch 2 tablets before bed.  You can use the tub of cream anywhere except your face or private parts

## 2019-07-21 ENCOUNTER — Ambulatory Visit: Payer: Medicare Other | Admitting: Family Medicine

## 2019-07-21 ENCOUNTER — Other Ambulatory Visit: Payer: Self-pay | Admitting: Cardiovascular Disease

## 2019-07-21 DIAGNOSIS — I2510 Atherosclerotic heart disease of native coronary artery without angina pectoris: Secondary | ICD-10-CM

## 2019-07-21 DIAGNOSIS — I4819 Other persistent atrial fibrillation: Secondary | ICD-10-CM

## 2019-07-21 DIAGNOSIS — R42 Dizziness and giddiness: Secondary | ICD-10-CM

## 2019-07-25 ENCOUNTER — Encounter: Payer: Self-pay | Admitting: Family Medicine

## 2019-07-25 ENCOUNTER — Ambulatory Visit (INDEPENDENT_AMBULATORY_CARE_PROVIDER_SITE_OTHER)
Admission: RE | Admit: 2019-07-25 | Discharge: 2019-07-25 | Disposition: A | Discharge status: Home / Self Care | Payer: Medicare Other | Source: Ambulatory Visit | Attending: Family Medicine | Admitting: Family Medicine

## 2019-07-25 ENCOUNTER — Other Ambulatory Visit: Payer: Self-pay

## 2019-07-25 ENCOUNTER — Ambulatory Visit (INDEPENDENT_AMBULATORY_CARE_PROVIDER_SITE_OTHER): Payer: Medicare Other | Admitting: Family Medicine

## 2019-07-25 DIAGNOSIS — I2510 Atherosclerotic heart disease of native coronary artery without angina pectoris: Secondary | ICD-10-CM

## 2019-07-25 DIAGNOSIS — R05 Cough: Secondary | ICD-10-CM | POA: Diagnosis not present

## 2019-07-25 DIAGNOSIS — J189 Pneumonia, unspecified organism: Secondary | ICD-10-CM

## 2019-07-25 DIAGNOSIS — Z91018 Allergy to other foods: Secondary | ICD-10-CM | POA: Diagnosis not present

## 2019-07-25 DIAGNOSIS — R059 Cough, unspecified: Secondary | ICD-10-CM

## 2019-07-25 DIAGNOSIS — J8410 Pulmonary fibrosis, unspecified: Secondary | ICD-10-CM | POA: Diagnosis not present

## 2019-07-25 NOTE — Progress Notes (Signed)
This visit occurred during the SARS-CoV-2 public health emergency.  Safety protocols were in place, including screening questions prior to the visit, additional usage of staff PPE, and extensive cleaning of exam room while observing appropriate contact time as indicated for disinfecting solutions.  Prev seen at [facility] with a cough and started on abx 06/2019.  Done with abx now.  No sputum except for AM throat clearing.  No wheeze.  Due for follow-up chest x-ray.  Presumed allergies to tomatoes.  He had rash and itching after exposure.  He cut out strawberries but had rash with eating ketchup.  B rash with prev flares.  Prev steroid injection helped but sx returned with eating ketchup.  He has allergy clinic pending.  No sx now.  No airway sx.  No lip swelling.  Had rhinorrhea previously.  Meds, vitals, and allergies reviewed.   ROS: Per HPI unless specifically indicated in ROS section   GEN: nad, alert and oriented HEENT: NCAT  NECK: supple w/o LA, no stridor. CV: rrr.  PULM: ctab, no inc wob, no wheeze. ABD: soft, +bs EXT: no edema SKIN: Well-perfused.

## 2019-07-25 NOTE — Patient Instructions (Signed)
Stay off the benadryl for now.  I'll await the allergy clinic notes.  Avoid all tomatoes and ketchup.  Take care.  Glad to see you.

## 2019-07-27 ENCOUNTER — Other Ambulatory Visit: Payer: Self-pay

## 2019-07-27 ENCOUNTER — Ambulatory Visit (INDEPENDENT_AMBULATORY_CARE_PROVIDER_SITE_OTHER): Payer: Medicare Other | Admitting: *Deleted

## 2019-07-27 DIAGNOSIS — Z5181 Encounter for therapeutic drug level monitoring: Secondary | ICD-10-CM | POA: Diagnosis not present

## 2019-07-27 DIAGNOSIS — I4819 Other persistent atrial fibrillation: Secondary | ICD-10-CM

## 2019-07-27 DIAGNOSIS — Z91018 Allergy to other foods: Secondary | ICD-10-CM | POA: Insufficient documentation

## 2019-07-27 LAB — POCT INR: INR: 1.8 — AB (ref 2.0–3.0)

## 2019-07-27 NOTE — Assessment & Plan Note (Signed)
History of, resolved.  See notes on follow-up chest x-ray.  Lungs are clear.

## 2019-07-27 NOTE — Assessment & Plan Note (Signed)
No systemic symptoms.  Presumed tomato allergy.  Routine cautions given.  Discussed avoidance.  He will follow-up with allergy clinic.  Appreciate the help of all involved.

## 2019-07-27 NOTE — Patient Instructions (Signed)
Description   Today take another 1/2 tablet when you get home, then continue taking 1 tablet daily except 1.5 tablets each Sundays and Thursdays. Recheck INR in 2-3 weeks when you see Dr. Burt Knack. [facility] [phone]

## 2019-08-07 DIAGNOSIS — T560X1A Toxic effect of lead and its compounds, accidental (unintentional), initial encounter: Secondary | ICD-10-CM | POA: Diagnosis not present

## 2019-08-11 ENCOUNTER — Encounter: Payer: Self-pay | Admitting: Cardiovascular Disease

## 2019-08-11 ENCOUNTER — Ambulatory Visit (INDEPENDENT_AMBULATORY_CARE_PROVIDER_SITE_OTHER): Payer: Medicare Other

## 2019-08-11 ENCOUNTER — Other Ambulatory Visit: Payer: Self-pay

## 2019-08-11 ENCOUNTER — Ambulatory Visit (INDEPENDENT_AMBULATORY_CARE_PROVIDER_SITE_OTHER): Payer: Medicare Other | Admitting: Cardiovascular Disease

## 2019-08-11 VITALS — BP 142/72 | HR 85 | Ht 66.0 in | Wt 214.0 lb

## 2019-08-11 DIAGNOSIS — Z5181 Encounter for therapeutic drug level monitoring: Secondary | ICD-10-CM

## 2019-08-11 DIAGNOSIS — R011 Cardiac murmur, unspecified: Secondary | ICD-10-CM

## 2019-08-11 DIAGNOSIS — I4821 Permanent atrial fibrillation: Secondary | ICD-10-CM | POA: Diagnosis not present

## 2019-08-11 DIAGNOSIS — I2510 Atherosclerotic heart disease of native coronary artery without angina pectoris: Secondary | ICD-10-CM

## 2019-08-11 DIAGNOSIS — I4819 Other persistent atrial fibrillation: Secondary | ICD-10-CM | POA: Diagnosis not present

## 2019-08-11 DIAGNOSIS — R0602 Shortness of breath: Secondary | ICD-10-CM | POA: Diagnosis not present

## 2019-08-11 LAB — POCT INR: INR: 2.2 (ref 2.0–3.0)

## 2019-08-11 MED ORDER — METOPROLOL TARTRATE 25 MG PO TABS: 25.0000 mg | ORAL_TABLET | Freq: Two times a day (BID) | ORAL | 3 refills | Status: DC

## 2019-08-11 NOTE — Patient Instructions (Signed)
Medication Instructions:  1) INCREASE METOPROLOL to 25 mg twice daily *If you need a refill on your cardiac medications before your next appointment, please call your pharmacy*  Testing/Procedures: Your provider has requested that you have an echocardiogram. Echocardiography is a painless test that uses sound waves to create images of your heart. It provides your doctor with information about the size and shape of your heart and how well your heart's chambers and valves are working. This procedure takes approximately one hour. There are no restrictions for this procedure.  Follow-Up: At [facility], you and your health needs are our priority.  As part of our continuing mission to provide you with exceptional heart care, we have created designated Provider Care Teams.  These Care Teams include your primary Cardiologist (physician) and Advanced Practice Providers (APPs -  Physician Assistants and Nurse Practitioners) who all work together to provide you with the care you need, when you need it. Your next appointment:   12 month(s) The format for your next appointment:   In Person Provider:   You may see Sherren Mocha, MD or one of the following Advanced Practice Providers on your designated Care Team:    Richardson Dopp, PA-C  [place], [place]

## 2019-08-11 NOTE — Patient Instructions (Signed)
Description   Continue on same dosage 1 tablet daily except 1.5 tablets each Sundays and Thursdays. Recheck INR in 3 weeks. Coumadin Clinic [phone]

## 2019-08-11 NOTE — Progress Notes (Signed)
Cardiology Office Note:    Date:  08/11/2019   ID:  HILLIARD BORGES, DOB [birth year], MRN [id number]  PCP:  Tonia Ghent, MD  Cardiologist:  Sherren Mocha, MD  Electrophysiologist:  None   Referring MD: Tonia Ghent, MD   Chief Complaint  Patient presents with  . Shortness of Breath    History of Present Illness:    [name] is a 86 y.o. male with a hx of coronary artery disease and permanent atrial fibrillation.  He presents for follow-up evaluation today.  He was last seen 1 year ago in a telehealth visit in light of the COVID-19 pandemic.  The patient is here alone today.  Comorbid medical conditions include type 2 diabetes, stage III chronic kidney disease, hypertension, hyperlipidemia, and gout.  He had to have a toe amputated for osteomyelitis.  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  From a cardiac perspective, he complains of shortness of breath with activity and with bending over.  Symptoms have progressed over the last year.  He brings in home blood pressure readings that show good control of his blood pressure.  Heart rates are typically in the 80s and 90s.  He denies any chest pain or pressure.  He denies orthopnea or PND.  He has had mild leg swelling.  Past Medical History:  Diagnosis Date  . Bradycardia    a. nocturnal with pauses overnight on tele likely due to OSA 10/2015.  [name] Cancer ([place])    skin cancer  . CKD (chronic kidney disease), stage III   . Coronary artery disease    a. prev nonobst. b. LHC 10/24/15: occlusion of small diffusely diseased OM2 s/p balloon angioplasty, mild nonobstructive disease of mLAD and mRCA, normal LVEDP  . Diabetes mellitus without complication ([place])   . Diverticulosis   . Esophageal reflux   . Esophageal stricture from GERD 06/08/2011   With stricture at GE junction on EGD, dilated 06/2011   . Gastric ulcer    on EGD 2013  . Gout, unspecified    "on daily RX" (09/09/2017)  . History of blood  transfusion 02/1964   "when I had right little finger cut off"  . Hyperlipidemia   . Hypertension   . Obesity, unspecified   . Orthopnea   . OSA on CPAP   . Paroxysmal nocturnal dyspnea   . Persistent atrial fibrillation ([place])   . Vertigo     Past Surgical History:  Procedure Laterality Date  . AMPUTATION Right 03/03/2019   Procedure: RIGHT 2ND TOE AMPUTATION;  Surgeon: [name], MD;  Location: [facility];  Service: Orthopedics;  Laterality: Right;  . BALLOON DILATION N/A 10/22/2014   Procedure: BALLOON DILATION;  Surgeon: [name], MD;  Location: [facility];  Service: Endoscopy;  Laterality: N/A;  . CARDIAC CATHETERIZATION  07/2008  . CARDIAC CATHETERIZATION N/A 10/24/2015   Procedure: Left Heart Cath and Coronary Angiography;  Surgeon: [name], MD;  Location: [facility];  Service: Cardiovascular;  Laterality: N/A;  . CARDIAC CATHETERIZATION N/A 10/24/2015   Procedure: Coronary Balloon Angioplasty;  Surgeon: [name], MD;  Location: [facility];  Service: Cardiovascular;  Laterality: N/A;  . Carotid Dopplers  09/2009   no sig extracranial stenosis and vertebral arteries had antegrade flow  . CATARACT EXTRACTION W/ INTRAOCULAR LENS  IMPLANT, BILATERAL Bilateral   . ESOPHAGOGASTRODUODENOSCOPY (EGD) WITH ESOPHAGEAL DILATION     "I've had it stretched 3 times" (  10/23/2015)  . ESOPHAGOGASTRODUODENOSCOPY (EGD) WITH ESOPHAGEAL DILATION  06/2011   /medical hx above  (09/09/2017)  . ESOPHAGOGASTRODUODENOSCOPY (EGD) WITH PROPOFOL N/A 10/22/2014   Procedure: ESOPHAGOGASTRODUODENOSCOPY (EGD) WITH PROPOFOL;  Surgeon: [name], MD;  Location: [facility];  Service: Endoscopy;  Laterality: N/A;  . FINGER SURGERY Right 02/1964   "cut little finger off; had it reattached"  . I & D EXTREMITY Right 09/10/2017   Procedure: RIGHT FOOT DEBRIDEMENT;  Surgeon: [name], MD;  Location: [facility];  Service: Orthopedics;  Laterality: Right;  . MRI of brain  09/2009    chronic microvascular ischemia  . OSA Sleep Study     per Dr. Brett Fairy  . SHOULDER ARTHROSCOPY W/ ROTATOR CUFF REPAIR Right 2008    Current Medications: Current Meds  Medication Sig  . acetaminophen (TYLENOL) 500 MG tablet Take 1,000 mg by mouth as needed for mild pain.  [name] allopurinol (ZYLOPRIM) 100 MG tablet Take 1 tablet (100 mg total) by mouth 2 (two) times daily.  . Colchicine (MITIGARE) 0.6 MG CAPS Take 1 tablet by mouth 2 (two) times daily as needed (Mitigare).  . cyanocobalamin (,VITAMIN B-12,) 1000 MCG/ML injection 1075mcg IM each week for 4 doses then once a month thereafter  . diclofenac Sodium (VOLTAREN) 1 % GEL Apply 1 application topically 4 (four) times daily as needed (pain).  [name] diltiazem (CARDIZEM CD) 180 MG 24 hr capsule Take 1 capsule (180 mg total) by mouth daily. Please keep upcoming appt in May with Dr. Burt Knack before anymore refills. Thank you  . furosemide (LASIX) 20 MG tablet Take 1 tablet (20 mg total) by mouth daily. Please make yearly appt with Dr. Burt Knack for May before anymore refills. 1st attempt  . HYDROcodone-acetaminophen (NORCO/VICODIN) 5-325 MG tablet Take 1 tablet by mouth every 4 (four) hours as needed for moderate pain.  . Insulin Syringe-Needle U-100 (B-D INSULIN SYRINGE 1CC/25GX1") 25G X 1" 1 ML MISC Use with B12 shots  . lisinopril (ZESTRIL) 20 MG tablet Take 1 tablet (20 mg total) by mouth daily. Please keep upcoming appt in May with Dr. Burt Knack before anymore refills. Thank you  . loratadine (CLARITIN) 10 MG tablet Take 10 mg by mouth daily as needed for allergies.  . metoprolol tartrate (LOPRESSOR) 25 MG tablet Take 1 tablet (25 mg total) by mouth 2 (two) times daily.  . mupirocin ointment (BACTROBAN) 2 % Place 1 application into the nose 2 (two) times daily.  . nitroGLYCERIN (NITROSTAT) 0.4 MG SL tablet Place 1 tablet (0.4 mg total) under the tongue every 5 (five) minutes x 3 doses as needed for chest pain.  [name] omeprazole (PRILOSEC) 40 MG capsule TAKE 1  CAPSULE BY MOUTH EVERY DAY  . triamcinolone cream (KENALOG) 0.1 % Apply 1 application topically 2 (two) times daily.  [name] warfarin (COUMADIN) 4 MG tablet TAKE AS DIRECTED BY COUMADIN CLINIC  . [DISCONTINUED] metoprolol tartrate (LOPRESSOR) 25 MG tablet Take 0.5 tablets (12.5 mg total) by mouth 2 (two) times daily.     Allergies:   Penicillins, Atorvastatin, Pravastatin, Hydrocodone, Tomato, Uloric [febuxostat], Vancomycin, Biaxin [clarithromycin], and Doxycycline   Social History   Socioeconomic History  . Marital status: Widowed    Spouse name: Not on file  . Number of children: 3  . Years of education: Not on file  . Highest education level: Not on file  Occupational History  . 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 Smoking status: Former Smoker  Packs/day: 0.12    Years: 4.00    Pack years: 0.48    Types: Cigarettes    Quit date: 03/16/1969    Years since quitting: 50.4  . Smokeless tobacco: Former Systems developer  . Tobacco comment: "chewed when I smoked; smoked 1 pack/week for about 4 years, quit 1971  Substance and Sexual Activity  . Alcohol use: Not Currently    Alcohol/week: 0.0 standard drinks    Comment: 09/09/2017  "Quit all alcohol in 1970's"  . Drug use: Never  . Sexual activity: Not on file  Other Topics Concern  . Not on file  Social History Narrative   Lives in [place], married 1953, widowed after 67 years   Retired Administrator, still does Biomedical scientist.   He is not routinely exercising.   Daily caffeine    Social Determinants of Health   Financial Resource Strain: Low Risk   . Difficulty of Paying Living Expenses: Not hard at all  Food Insecurity: No Food Insecurity  . Worried About Charity fundraiser in the Last Year: Never true  . Ran Out of Food in the Last Year: Never true  Transportation Needs: No Transportation Needs  . Lack of Transportation (Medical): No  . Lack of Transportation (Non-Medical): No  Physical  Activity: Inactive  . Days of Exercise per Week: 0 days  . Minutes of Exercise per Session: 0 min  Stress: No Stress Concern Present  . Feeling of Stress : Not at all  Social Connections:   . Frequency of Communication with Friends and Family:   . Frequency of Social Gatherings with Friends and Family:   . Attends Religious Services:   . Active Member of Clubs or Organizations:   . Attends Archivist Meetings:   [name] Marital Status:      Family History: The patient's family history includes Dementia in his father; Heart disease in his brother; Hypertension in his father and another family member; Pneumonia in his father; Stomach cancer in his mother. There is no history of Prostate cancer or Colon cancer.  ROS:   Please see the history of present illness.    All other systems reviewed and are negative.  EKGs/Labs/Other Studies Reviewed:    EKG:  EKG is ordered today.  The ekg ordered today demonstrates atrial fibrillation 85 bpm, otherwise within normal limits.  Recent Labs: 05/16/2019: ALT 24; BUN 15; Creatinine, Ser 1.32; Hemoglobin 15.8; Platelets 106.0; Potassium 4.2; Sodium 139; TSH 2.33  Recent Lipid Panel    Component Value Date/Time   CHOL 166 05/16/2019 0923   TRIG 279.0 (H) 05/16/2019 0923   HDL 26.40 (L) 05/16/2019 0923   CHOLHDL 6 05/16/2019 0923   VLDL 55.8 (H) 05/16/2019 0923   LDLCALC 82 10/24/2015 0430   LDLDIRECT 95.0 05/16/2019 0923    Physical Exam:    VS:  BP (!) 142/72   Pulse 85   Ht 5\' 6"  (1.676 m)   Wt 214 lb (97.1 kg)   SpO2 95%   BMI 34.54 kg/m     Wt Readings from Last 3 Encounters:  08/11/19 214 lb (97.1 kg)  07/25/19 216 lb 3 oz (98.1 kg)  07/20/19 217 lb 4 oz (98.5 kg)     GEN:  Well nourished, well developed in no acute distress HEENT: Normal NECK: No JVD; No carotid bruits LYMPHATICS: No lymphadenopathy CARDIAC: irregularly irregular, no murmurs, rubs, gallops RESPIRATORY:  Clear to auscultation without rales, wheezing  or rhonchi  ABDOMEN: Soft, non-tender, non-distended MUSCULOSKELETAL:  No edema; No deformity  SKIN: Warm and dry NEUROLOGIC:  Alert and oriented x 3 PSYCHIATRIC:  Normal affect   ASSESSMENT:    1. Murmur, cardiac   2. Shortness of breath   3. Permanent atrial fibrillation ([place])   4. Coronary artery disease involving native coronary artery of native heart without angina pectoris    PLAN:    In order of problems listed above:  1. Newly identified murmur, sounds fairly consistent with aortic stenosis even with diminished aortic valve closure sound.  We will check an echocardiogram in light of his shortness of breath. 2. Suspect multifactorial.  The patient has a tight abdomen and I am sure this is playing a role in his shortness of breath when he bends over.  We will check an echo as outlined above. 3. Tolerating oral anticoagulation without problems.  Heart rate is a little elevated and I have recommended increasing his metoprolol to 25 mg twice daily.  He will remain on diltiazem 180 mg daily.  We will check an echocardiogram today. 4. Doing well on current medical therapy.  No changes suggested.   Medication Adjustments/Labs and Tests Ordered: Current medicines are reviewed at length with the patient today.  Concerns regarding medicines are outlined above.  Orders Placed This Encounter  Procedures  . EKG 12-Lead  . ECHOCARDIOGRAM COMPLETE   Meds ordered this encounter  Medications  . metoprolol tartrate (LOPRESSOR) 25 MG tablet    Sig: Take 1 tablet (25 mg total) by mouth 2 (two) times daily.    Dispense:  180 tablet    Refill:  3    Patient Instructions  Medication Instructions:  1) INCREASE METOPROLOL to 25 mg twice daily *If you need a refill on your cardiac medications before your next appointment, please call your pharmacy*  Testing/Procedures: Your provider has requested that you have an echocardiogram. Echocardiography is a painless test that uses sound waves to  create images of your heart. It provides your doctor with information about the size and shape of your heart and how well your heart's chambers and valves are working. This procedure takes approximately one hour. There are no restrictions for this procedure.  Follow-Up: At [facility], you and your health needs are our priority.  As part of our continuing mission to provide you with exceptional heart care, we have created designated Provider Care Teams.  These Care Teams include your primary Cardiologist (physician) and Advanced Practice Providers (APPs -  Physician Assistants and Nurse Practitioners) who all work together to provide you with the care you need, when you need it. Your next appointment:   12 month(s) The format for your next appointment:   In Person Provider:   You may see Sherren Mocha, MD or one of the following Advanced Practice Providers on your designated Care Team:    Richardson Dopp, PA-C  Robbie Lis, [place]      Signed, Sherren Mocha, MD  08/11/2019 2:46 PM    [place]

## 2019-08-15 ENCOUNTER — Ambulatory Visit (INDEPENDENT_AMBULATORY_CARE_PROVIDER_SITE_OTHER): Payer: Medicare Other | Admitting: *Deleted

## 2019-08-15 DIAGNOSIS — E538 Deficiency of other specified B group vitamins: Secondary | ICD-10-CM

## 2019-08-15 MED ORDER — CYANOCOBALAMIN 1000 MCG/ML IJ SOLN
1000.0000 ug | Freq: Once | INTRAMUSCULAR | Status: AC
  Administered 2019-08-15: 1000 ug via INTRAMUSCULAR

## 2019-08-15 NOTE — Progress Notes (Signed)
Per orders of Dr. Damita Dunnings, injection of B12 given by Virl Cagey. Patient tolerated injection well.

## 2019-08-23 ENCOUNTER — Telehealth: Payer: Self-pay | Admitting: Family Medicine

## 2019-08-23 NOTE — Telephone Encounter (Signed)
Dr.Duncan asked patient's daughter, Lawerance Bach, to call back about patient's medication.  Patient takes 2 Colchicine (Colcrys) a day.

## 2019-08-24 NOTE — Telephone Encounter (Signed)
Form done.  Family can pick this up when they come in for a visit in the next few days.  Thanks.

## 2019-08-24 NOTE — Telephone Encounter (Signed)
Daughter advised and says she will pick it up on Monday when she brings her husband in for an OV.

## 2019-08-29 ENCOUNTER — Ambulatory Visit: Payer: Medicare Other | Admitting: Physician Assistant

## 2019-08-31 ENCOUNTER — Other Ambulatory Visit: Payer: Self-pay

## 2019-08-31 ENCOUNTER — Ambulatory Visit: Payer: Self-pay

## 2019-08-31 ENCOUNTER — Ambulatory Visit (INDEPENDENT_AMBULATORY_CARE_PROVIDER_SITE_OTHER): Payer: Medicare Other | Admitting: Family Medicine

## 2019-08-31 ENCOUNTER — Encounter: Payer: Self-pay | Admitting: Family Medicine

## 2019-08-31 DIAGNOSIS — M25562 Pain in left knee: Secondary | ICD-10-CM | POA: Diagnosis not present

## 2019-08-31 NOTE — Addendum Note (Signed)
Addended by: Jacklyn Shell on: 08/31/2019 01:55 PM   Modules accepted: Orders

## 2019-08-31 NOTE — Progress Notes (Signed)
Office Visit Note   Patient: [name]           Date of Birth: 1930-07-27           MRN: [id number] Visit Date: 08/31/2019 Requested by: Tonia Ghent, MD [address] [place],  [place] [address] PCP: Tonia Ghent, MD  Subjective: Chief Complaint  Patient presents with   Left Knee - Pain    HPI: He is here with left knee pain.  Last week he was getting up onto his tractor and thinks he bumped his knee against something.  He did not feel any pain at that time, but later that day his knee started to swell.  It has become progressively more painful.  He went to [facility] urgent care over the weekend but they told him they were not going to aspirate or inject his knee again.  He had recently had gel injections in both knees and prior to that, cortisone.  He has a history of gout affecting other areas.  He is on allopurinol and colchicine.  He has never had an attack in his knee.               ROS: No fevers or chills.  All other systems were reviewed and are negative.  Objective: Vital Signs: There were no vitals taken for this visit.  Physical Exam:  General:  Alert and oriented, in no acute distress. Pulm:  Breathing unlabored. Psy:  Normal mood, congruent affect. Skin: No erythema or bruising. Left knee: 2+ effusion with slight warmth.  Full active extension, flexion of about 120 degrees.  He is tender along the medial joint line.  No palpable click with McMurray's.  Imaging: XR Knee 1-2 Views Left  Result Date: 08/31/2019 X-rays left knee reveal no sign of fracture.  There is soft tissue calcification near the medial side of the joint.  He has moderate medial compartment DJD.  The right knee has bone-on-bone medial compartment DJD.   Assessment & Plan: 1.  Right knee pain with effusion, suspect gout.  Cannot rule out medial meniscus injury. -Discussed options with him, elected to aspirate and inject his knee.  We will send the fluid for crystal  analysis.  If gout is confirmed and cortisone helps, he will follow-up as needed.  If next week he is not feeling any better, he will call me and I will order an MRI scan.     Procedures: Left knee aspiration and injection: After sterile prep with Betadine, injected 5 cc 1% lidocaine without epinephrine, then aspirated 50 cc of hazy yellow synovial fluid, then injected 40 mg methylprednisolone from superolateral approach.    PMFS History: Patient Active Problem List   Diagnosis Date Noted   Food allergy 07/27/2019   Healthcare maintenance 05/22/2019   B12 deficiency 05/17/2019   Skin irritation 04/02/2019   Osteomyelitis of second toe of right foot (HCC)    Achilles tendinitis 12/05/2017   Idiopathic chronic gout, unspecified site, without tophus (tophi) 10/06/2017   Cutaneous abscess of right foot    Atypical angina ([place]) 89/38/1017   Acute diastolic heart failure (HCC)    CAD S/P percutaneous coronary angioplasty    Chronic atrial fibrillation (HCC)    CKD (chronic kidney disease), stage III    Persistent atrial fibrillation (HCC)    Hypertension    Hyperlipidemia    Bradycardia    Back pain 05/24/2015   Triceps tendonitis 05/24/2015   Diabetes mellitus without complication (  [place]) 12/18/2014   Dysphagia, pharyngoesophageal phase 09/25/2014   History of esophageal stricture 09/25/2014   Chronic anticoagulation 36/62/9476   Helicobacter pylori duodenitis 09/25/2014   Cough 03/14/2014   Benign paroxysmal positional vertigo 03/14/2014   Encounter for therapeutic drug monitoring 04/12/2013   Esophageal stricture from GERD 06/08/2011   Advance care planning 04/20/2011   OSA on CPAP 09/22/2010   HYPERCHOLESTEROLEMIA 08/15/2008   Gout 08/15/2008   OBESITY 08/15/2008   Essential hypertension 08/15/2008   CAD (coronary artery disease), native coronary artery 08/15/2008   GASTROESOPHAGEAL REFLUX DISEASE 08/15/2008   Past Medical History:    Diagnosis Date   Bradycardia    a. nocturnal with pauses overnight on tele likely due to OSA 10/2015.   Cancer ([place])    skin cancer   CKD (chronic kidney disease), stage III    Coronary artery disease    a. prev nonobst. b. LHC 10/24/15: occlusion of small diffusely diseased OM2 s/p balloon angioplasty, mild nonobstructive disease of mLAD and mRCA, normal LVEDP   Diabetes mellitus without complication (HCC)    Diverticulosis    Esophageal reflux    Esophageal stricture from GERD 06/08/2011   With stricture at GE junction on EGD, dilated 06/2011    Gastric ulcer    on EGD 2013   Gout, unspecified    "on daily RX" (09/09/2017)   History of blood transfusion 02/1964   "when I had right little finger cut off"   Hyperlipidemia    Hypertension    Obesity, unspecified    Orthopnea    OSA on CPAP    Paroxysmal nocturnal dyspnea    Persistent atrial fibrillation (HCC)    Vertigo     Family History  Problem Relation Age of Onset   Stomach cancer Mother        died in 32's    Hypertension Father        died in his 18's pna likely dementia   Dementia Father    Pneumonia Father    Heart disease Brother        S/P CABG   Hypertension Other    Prostate cancer Neg Hx    Colon cancer Neg Hx     Past Surgical History:  Procedure Laterality Date   AMPUTATION Right 03/03/2019   Procedure: RIGHT 2ND TOE AMPUTATION;  Surgeon: [name], MD;  Location: [facility];  Service: Orthopedics;  Laterality: Right;   BALLOON DILATION N/A 10/22/2014   Procedure: BALLOON DILATION;  Surgeon: [name], MD;  Location: [facility];  Service: Endoscopy;  Laterality: N/A;   CARDIAC CATHETERIZATION  07/2008   CARDIAC CATHETERIZATION N/A 10/24/2015   Procedure: Left Heart Cath and Coronary Angiography;  Surgeon: [name], MD;  Location: [facility];  Service: Cardiovascular;  Laterality: N/A;   CARDIAC CATHETERIZATION N/A 10/24/2015   Procedure: Coronary  Balloon Angioplasty;  Surgeon: [name], MD;  Location: [facility];  Service: Cardiovascular;  Laterality: N/A;   Carotid Dopplers  09/2009   no sig extracranial stenosis and vertebral arteries had antegrade flow   CATARACT EXTRACTION W/ INTRAOCULAR LENS  IMPLANT, BILATERAL Bilateral    ESOPHAGOGASTRODUODENOSCOPY (EGD) WITH ESOPHAGEAL DILATION     "I've had it stretched 3 times" (10/23/2015)   ESOPHAGOGASTRODUODENOSCOPY (EGD) WITH ESOPHAGEAL DILATION  06/2011   /medical hx above  (09/09/2017)   ESOPHAGOGASTRODUODENOSCOPY (EGD) WITH PROPOFOL N/A 10/22/2014   Procedure: ESOPHAGOGASTRODUODENOSCOPY (EGD) WITH PROPOFOL;  Surgeon: [name], MD;  [facility];  Service: Endoscopy;  Laterality: N/A;   FINGER SURGERY Right 02/1964   "cut little finger off; had it reattached"   I & D EXTREMITY Right 09/10/2017   Procedure: RIGHT FOOT DEBRIDEMENT;  Surgeon: [name], MD;  Location: [facility];  Service: Orthopedics;  Laterality: Right;   MRI of brain  09/2009   chronic microvascular ischemia   OSA Sleep Study     per Dr. 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

## 2019-09-01 ENCOUNTER — Telehealth: Payer: Self-pay | Admitting: Family Medicine

## 2019-09-01 ENCOUNTER — Ambulatory Visit (INDEPENDENT_AMBULATORY_CARE_PROVIDER_SITE_OTHER): Payer: Medicare Other | Admitting: *Deleted

## 2019-09-01 ENCOUNTER — Ambulatory Visit (HOSPITAL_COMMUNITY): Payer: Medicare Other | Attending: Cardiology

## 2019-09-01 DIAGNOSIS — R0602 Shortness of breath: Secondary | ICD-10-CM | POA: Diagnosis not present

## 2019-09-01 DIAGNOSIS — R011 Cardiac murmur, unspecified: Secondary | ICD-10-CM | POA: Diagnosis not present

## 2019-09-01 DIAGNOSIS — Z5181 Encounter for therapeutic drug level monitoring: Secondary | ICD-10-CM | POA: Diagnosis not present

## 2019-09-01 DIAGNOSIS — I4819 Other persistent atrial fibrillation: Secondary | ICD-10-CM

## 2019-09-01 LAB — POCT INR: INR: 2.5 (ref 2.0–3.0)

## 2019-09-01 NOTE — Telephone Encounter (Signed)
Please advise 

## 2019-09-01 NOTE — Telephone Encounter (Signed)
Results are still pending.

## 2019-09-01 NOTE — Patient Instructions (Signed)
Description   Continue on same dosage 1 tablet daily except 1.5 tablets each Sundays and Thursdays. Recheck INR in 4 weeks. [facility] [phone]

## 2019-09-01 NOTE — Telephone Encounter (Signed)
Patient daughter Cassandria Santee called asking for Dr. Junius Roads to call patient with test results. Patient phone number is [phone].

## 2019-09-04 ENCOUNTER — Other Ambulatory Visit: Payer: Self-pay | Admitting: Family Medicine

## 2019-09-04 ENCOUNTER — Ambulatory Visit (INDEPENDENT_AMBULATORY_CARE_PROVIDER_SITE_OTHER): Payer: Medicare Other | Admitting: Family Medicine

## 2019-09-04 ENCOUNTER — Other Ambulatory Visit: Payer: Self-pay

## 2019-09-04 ENCOUNTER — Telehealth: Payer: Self-pay | Admitting: Family Medicine

## 2019-09-04 ENCOUNTER — Encounter: Payer: Self-pay | Admitting: Family Medicine

## 2019-09-04 DIAGNOSIS — M25562 Pain in left knee: Secondary | ICD-10-CM

## 2019-09-04 DIAGNOSIS — K222 Esophageal obstruction: Secondary | ICD-10-CM

## 2019-09-04 DIAGNOSIS — R1319 Other dysphagia: Secondary | ICD-10-CM

## 2019-09-04 NOTE — Telephone Encounter (Signed)
Please advise 

## 2019-09-04 NOTE — Telephone Encounter (Signed)
Joint fluid test result confirms gout.  Hopefully his pain is better by now.  If not, we will order MRI scan.

## 2019-09-04 NOTE — Addendum Note (Signed)
Addended by: Hortencia Pilar on: 09/04/2019 08:46 AM   Modules accepted: Orders

## 2019-09-04 NOTE — Progress Notes (Signed)
Office Visit Note   Patient: [name]           Date of Birth: 06/24/1930           MRN: [id number] Visit Date: 09/04/2019 Requested by: Tonia Ghent, MD [place],  [place] [address] PCP: Tonia Ghent, MD  Subjective: No chief complaint on file.   HPI: Here with persistent left knee pain.  Last week aspiration and injection helped, but his pain and swelling are starting to return.  Still analysis confirmed gout.              ROS: No fevers or chills.  All other systems were reviewed and are negative.  Objective: Vital Signs: There were no vitals taken for this visit.  Physical Exam:  General:  Alert and oriented, in no acute distress. Pulm:  Breathing unlabored. Psy:  Normal mood, congruent affect. Skin: No rash or erythema Left knee: 1-2+ effusion with slight warmth.  Slightly tender on the medial and lateral joint lines.  Imaging: No results found.  Assessment & Plan: 1.  Left knee gouty arthropathy -Aspirate and inject 1 more time today.  MRI scan if he fails to improve.  If this helps, then after couple weeks we will resume allopurinol along with colchicine until uric acid level is in therapeutic range.     Procedures: Left knee aspiration and injection: After sterile prep with Betadine, injected 5 cc 1% lidocaine without epinephrine, then aspirated 25 cc of hazy yellow synovial fluid, then injected 40 mg methylprednisolone from superolateral approach.   PMFS History: Patient Active Problem List   Diagnosis Date Noted  . Food allergy 07/27/2019  . Healthcare maintenance 05/22/2019  . B12 deficiency 05/17/2019  . Skin irritation 04/02/2019  . Osteomyelitis of second toe of right foot ([place])   . Achilles tendinitis 12/05/2017  . Idiopathic chronic gout, unspecified site, without tophus (tophi) 10/06/2017  . Cutaneous abscess of right foot   . Atypical angina ([place]) 11/08/2015  . Acute diastolic heart failure ([place])   . CAD S/P  percutaneous coronary angioplasty   . Chronic atrial fibrillation ([place])   . CKD (chronic kidney disease), stage III   . Persistent atrial fibrillation ([place])   . Hypertension   . Hyperlipidemia   . Bradycardia   . Back pain 05/24/2015  . Triceps tendonitis 05/24/2015  . Diabetes mellitus without complication ([place]) 44/96/7591  . Dysphagia, pharyngoesophageal phase 09/25/2014  . History of esophageal stricture 09/25/2014  . Chronic anticoagulation 09/25/2014  . Helicobacter pylori duodenitis 09/25/2014  . Cough 03/14/2014  . Benign paroxysmal positional vertigo 03/14/2014  . Encounter for therapeutic drug monitoring 04/12/2013  . Esophageal stricture from GERD 06/08/2011  . Advance care planning 04/20/2011  . OSA on CPAP 09/22/2010  . HYPERCHOLESTEROLEMIA 08/15/2008  . Gout 08/15/2008  . OBESITY 08/15/2008  . Essential hypertension 08/15/2008  . CAD (coronary artery disease), native coronary artery 08/15/2008  . GASTROESOPHAGEAL REFLUX DISEASE 08/15/2008   Past Medical History:  Diagnosis Date  . Bradycardia    a. nocturnal with pauses overnight on tele likely due to OSA 10/2015.  [name] Cancer ([place])    skin cancer  . CKD (chronic kidney disease), stage III   . Coronary artery disease    a. prev nonobst. b. LHC 10/24/15: occlusion of small diffusely diseased OM2 s/p balloon angioplasty, mild nonobstructive disease of mLAD and mRCA, normal LVEDP  . Diabetes mellitus without complication ([place])   . Diverticulosis   .  Esophageal reflux   . Esophageal stricture from GERD 06/08/2011   With stricture at GE junction on EGD, dilated 06/2011   . Gastric ulcer    on EGD 2013  . Gout, unspecified    "on daily RX" (09/09/2017)  . History of blood transfusion 02/1964   "when I had right little finger cut off"  . Hyperlipidemia   . Hypertension   . Obesity, unspecified   . Orthopnea   . OSA on CPAP   . Paroxysmal nocturnal dyspnea   . Persistent atrial fibrillation ([place])   . Vertigo       Family History  Problem Relation Age of Onset  . Stomach cancer Mother        died in 74's   . Hypertension Father        died in his 75's pna likely dementia  . Dementia Father   . Pneumonia Father   . Heart disease Brother        S/P CABG  . Hypertension Other   . Prostate cancer Neg Hx   . Colon cancer Neg Hx     Past Surgical History:  Procedure Laterality Date  . AMPUTATION Right 03/03/2019   Procedure: RIGHT 2ND TOE AMPUTATION;  Surgeon: [name], MD;  Location: [facility];  Service: Orthopedics;  Laterality: Right;  . BALLOON DILATION N/A 10/22/2014   Procedure: BALLOON DILATION;  Surgeon: [name], MD;  [facility];  Service: Endoscopy;  Laterality: N/A;  . CARDIAC CATHETERIZATION  07/2008  . CARDIAC CATHETERIZATION N/A 10/24/2015   Procedure: Left Heart Cath and Coronary Angiography;  Surgeon: [name], MD;  Location: [facility];  Service: Cardiovascular;  Laterality: N/A;  . CARDIAC CATHETERIZATION N/A 10/24/2015   Procedure: Coronary Balloon Angioplasty;  Surgeon: [name], MD;  Location: [facility];  Service: Cardiovascular;  Laterality: N/A;  . Carotid Dopplers  09/2009   no sig extracranial stenosis and vertebral arteries had antegrade flow  . CATARACT EXTRACTION W/ INTRAOCULAR LENS  IMPLANT, BILATERAL Bilateral   . ESOPHAGOGASTRODUODENOSCOPY (EGD) WITH ESOPHAGEAL DILATION     "I've had it stretched 3 times" (10/23/2015)  . ESOPHAGOGASTRODUODENOSCOPY (EGD) WITH ESOPHAGEAL DILATION  06/2011   /medical hx above  (09/09/2017)  . ESOPHAGOGASTRODUODENOSCOPY (EGD) WITH PROPOFOL N/A 10/22/2014   Procedure: ESOPHAGOGASTRODUODENOSCOPY (EGD) WITH PROPOFOL;  Surgeon: [name], MD;  [facility];  Service: Endoscopy;  Laterality: N/A;  . FINGER SURGERY Right 02/1964   "cut little finger off; had it reattached"  . I & D EXTREMITY Right 09/10/2017   Procedure: RIGHT FOOT DEBRIDEMENT;  Surgeon: [name], MD;  Location:  [facility];  Service: Orthopedics;  Laterality: Right;  . MRI of brain  09/2009   chronic microvascular ischemia  . OSA Sleep Study     per Dr. Brett Fairy  . SHOULDER ARTHROSCOPY W/ ROTATOR CUFF REPAIR Right 2008   Social History   Occupational History  . 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 Smoking status: Former Smoker    Packs/day: 0.12    Years: 4.00    Pack years: 0.48    Types: Cigarettes    Quit date: 03/16/1969    Years since quitting: 50.5  . Smokeless tobacco: Former Systems developer  . Tobacco comment: "chewed when I smoked; smoked 1 pack/week for about 4 years, quit 1971  Vaping Use  . Vaping Use: Never used  Substance and Sexual Activity  .  Alcohol use: Not Currently    Alcohol/week: 0.0 standard drinks    Comment: 09/09/2017  "Quit all alcohol in 1970's"  . Drug use: Never  . Sexual activity: Not on file

## 2019-09-04 NOTE — Telephone Encounter (Signed)
Sending to you.  Request phone call with lab results. Thanks.

## 2019-09-06 DIAGNOSIS — L0889 Other specified local infections of the skin and subcutaneous tissue: Secondary | ICD-10-CM | POA: Diagnosis not present

## 2019-09-06 LAB — SYNOVIAL FLUID, CELL COUNT
Eos, Fluid: 0 %
Lining Cells, Synovial: 0 %
Lymphs, Fluid: 3 %
Macrophages Fld: 17 %
Nuc cell # Fld: 12753 cells/uL — ABNORMAL HIGH (ref 0–200)
Polys, Fluid: 80 %
RBC, Fluid: 3000 /uL

## 2019-09-07 ENCOUNTER — Ambulatory Visit (INDEPENDENT_AMBULATORY_CARE_PROVIDER_SITE_OTHER): Payer: Medicare Other | Admitting: Neurology

## 2019-09-07 ENCOUNTER — Encounter: Payer: Self-pay | Admitting: Neurology

## 2019-09-07 VITALS — BP 131/79 | HR 72 | Ht 66.0 in | Wt 216.0 lb

## 2019-09-07 DIAGNOSIS — I2510 Atherosclerotic heart disease of native coronary artery without angina pectoris: Secondary | ICD-10-CM

## 2019-09-07 DIAGNOSIS — R202 Paresthesia of skin: Secondary | ICD-10-CM

## 2019-09-07 NOTE — Progress Notes (Signed)
Reason for visit: Numbness in Nathan hands  Referring physician: Dr. Judie Petit is a 86 y.o. [name]  History of present illness:  [name] is an 86 year old right-handed white [name] with a history of diabetes that is diet-controlled, obesity, and a known vitamin B12 deficiency on B12 shots.  [name] has noted a 1 year history of numbness in Nathan hands that is present at all times, but Nathan symptoms may worsen at times.  [name] finds it is difficult to use his hands for fine motor control activities such as buttoning buttons.  He denies any neck pain or pain down Nathan arms.  He denies any weakness in Nathan arms.  He denies any numbness in Nathan feet but he does note some gait instability.  He may feel unstable when he is standing, if he reaches over and touches something that problem goes away immediately.  He denies issues controlling Nathan bowels or Nathan bladder.  He has no back pain or pain down Nathan legs.  He denies any weakness in Nathan legs.  He does have significant issues with gout affecting Nathan feet and Nathan left knee.  He is sent to this office for further evaluation.  Past Medical History:  Diagnosis Date  . Bradycardia    a. nocturnal with pauses overnight on tele likely due to OSA 10/2015.  [name] Cancer ([employer])    skin cancer  . CKD (chronic kidney disease), stage III   . Coronary artery disease    a. prev nonobst. b. LHC 10/24/15: occlusion of small diffusely diseased OM2 s/p balloon angioplasty, mild nonobstructive disease of mLAD and mRCA, normal LVEDP  . Diabetes mellitus without complication ([place])   . Diverticulosis   . Esophageal reflux   . Esophageal stricture from GERD 06/08/2011   With stricture at GE junction on EGD, dilated 06/2011   . Gastric ulcer    on EGD 2013  . Gout, unspecified    "on daily RX" (09/09/2017)  . History of blood transfusion 02/1964   "when I had right little finger cut off"  . Hyperlipidemia   . Hypertension   . Obesity, unspecified   .  Orthopnea   . OSA on CPAP   . Paroxysmal nocturnal dyspnea   . Persistent atrial fibrillation ([place])   . Vertigo     Past Surgical History:  Procedure Laterality Date  . AMPUTATION Right 03/03/2019   Procedure: RIGHT 2ND TOE AMPUTATION;  Surgeon: [name], MD;  Location: [facility];  Service: Orthopedics;  Laterality: Right;  . BALLOON DILATION N/A 10/22/2014   Procedure: BALLOON DILATION;  Surgeon: [name], MD;  Location: [facility];  Service: Endoscopy;  Laterality: N/A;  . CARDIAC CATHETERIZATION  07/2008  . CARDIAC CATHETERIZATION N/A 10/24/2015   Procedure: Left Heart Cath and Coronary Angiography;  Surgeon: [name], MD;  Location: [facility];  Service: Cardiovascular;  Laterality: N/A;  . CARDIAC CATHETERIZATION N/A 10/24/2015   Procedure: Coronary Balloon Angioplasty;  Surgeon: [name], MD;  Location: [facility];  Service: Cardiovascular;  Laterality: N/A;  . Carotid Dopplers  09/2009   no sig extracranial stenosis and vertebral arteries had antegrade flow  . CATARACT EXTRACTION W/ INTRAOCULAR LENS  IMPLANT, BILATERAL Bilateral   . ESOPHAGOGASTRODUODENOSCOPY (EGD) WITH ESOPHAGEAL DILATION     "I've had it stretched 3 times" (10/23/2015)  . ESOPHAGOGASTRODUODENOSCOPY (EGD) WITH ESOPHAGEAL DILATION  06/2011   /medical hx above  (09/09/2017)  . ESOPHAGOGASTRODUODENOSCOPY (  EGD) WITH PROPOFOL N/A 10/22/2014   Procedure: ESOPHAGOGASTRODUODENOSCOPY (EGD) WITH PROPOFOL;  Surgeon: [name], MD;  Location: [facility];  Service: Endoscopy;  Laterality: N/A;  . FINGER SURGERY Right 02/1964   "cut little finger off; had it reattached"  . I & D EXTREMITY Right 09/10/2017   Procedure: RIGHT FOOT DEBRIDEMENT;  Surgeon: [name], MD;  Location: [facility];  Service: Orthopedics;  Laterality: Right;  . MRI of brain  09/2009   chronic microvascular ischemia  . OSA Sleep Study     per Dr. Brett Fairy  . SHOULDER ARTHROSCOPY W/ ROTATOR CUFF REPAIR Right 2008     Family History  Problem Relation Age of Onset  . Stomach cancer Mother        died in 46's   . Hypertension Father        died in his 68's pna likely dementia  . Dementia Father   . Pneumonia Father   . Heart disease Brother        S/P CABG  . Hypertension Other   . Prostate cancer Neg Hx   . Colon cancer Neg Hx     Social history:  reports that he quit smoking about 50 years ago. His smoking use included cigarettes. He has a 0.48 pack-year smoking history. He has quit using smokeless tobacco. He reports previous alcohol use. He reports that he does not use drugs.  Medications:  Prior to Admission medications   Medication Sig Start Date End Date Taking? Authorizing Provider  acetaminophen (TYLENOL) 500 MG tablet Take 1,000 mg by mouth as needed for mild pain.   Yes [provider]  allopurinol (ZYLOPRIM) 100 MG tablet Take 1 tablet (100 mg total) by mouth 2 (two) times daily. 05/15/19  Yes [name], MD  Colchicine (MITIGARE) 0.6 MG CAPS Take 1 tablet by mouth 2 (two) times daily as needed (Mitigare). 07/14/19  Yes Tonia Ghent, MD  cyanocobalamin (,VITAMIN B-12,) 1000 MCG/ML injection 1058mcg IM each week for 4 doses then once a month thereafter 05/19/19  Yes Tonia Ghent, MD  diclofenac Sodium (VOLTAREN) 1 % GEL Apply 1 application topically 4 (four) times daily as needed (pain).   Yes [provider]  diltiazem (CARDIZEM CD) 180 MG 24 hr capsule Take 1 capsule (180 mg total) by mouth daily. Please keep upcoming appt in May with Dr. Burt Knack before anymore refills. Thank you 07/21/19  Yes Burt Knack, Legrand Como, MD  furosemide (LASIX) 20 MG tablet Take 1 tablet (20 mg total) by mouth daily. Please make yearly appt with Dr. Burt Knack for May before anymore refills. 1st attempt 04/26/19  Yes Sherren Mocha, MD  HYDROcodone-acetaminophen (NORCO/VICODIN) 5-325 MG tablet Take 1 tablet by mouth every 4 (four) hours as needed for moderate pain. 03/03/19  Yes Persons, Bevely Palmer, [place]  Insulin Syringe-Needle U-100 (B-D INSULIN SYRINGE 1CC/25GX1") 25G X 1" 1 ML MISC Use with B12 shots 05/19/19  Yes Tonia Ghent, MD  lisinopril (ZESTRIL) 20 MG tablet Take 1 tablet (20 mg total) by mouth daily. Please keep upcoming appt in May with Dr. Burt Knack before anymore refills. Thank you 07/21/19  Yes Burt Knack, Legrand Como, MD  loratadine (CLARITIN) 10 MG tablet Take 10 mg by mouth daily as needed for allergies.   Yes [provider]  metoprolol tartrate (LOPRESSOR) 25 MG tablet Take 1 tablet (25 mg total) by mouth 2 (two) times daily. 08/11/19 08/05/20 Yes Sherren Mocha, MD  mupirocin ointment (BACTROBAN) 2 % Place 1 application into  Nathan nose 2 (two) times daily.   Yes [provider]  nitroGLYCERIN (NITROSTAT) 0.4 MG SL tablet Place 1 tablet (0.4 mg total) under Nathan tongue every 5 (five) minutes x 3 doses as needed for chest pain. 11/09/15  Yes Simmons, Brittainy M, PA-C  omeprazole (PRILOSEC) 40 MG capsule TAKE 1 CAPSULE BY MOUTH EVERY DAY 09/04/19  Yes Tonia Ghent, MD  triamcinolone cream (KENALOG) 0.1 % Apply 1 application topically 2 (two) times daily. 07/20/19 07/19/20 Yes Copland, Frederico Hamman, MD  warfarin (COUMADIN) 4 MG tablet TAKE AS DIRECTED BY COUMADIN CLINIC 04/10/19  Yes Sherren Mocha, MD      Allergies  Allergen Reactions  . Penicillins Shortness Of Breath and Swelling    [name] tolerated Rocephin injection 2019  [name] HAS HAD A PCN REACTION WITH IMMEDIATE RASH, FACIAL/TONGUE/THROAT SWELLING, SOB, OR LIGHTHEADEDNESS WITH HYPOTENSION:  #  #  YES  #  Has [name] had a PCN reaction causing severe rash involving mucus membranes or skin necrosis: NO Has [name] had a PCN reaction that required hospitalization NO Has [name] had a PCN reaction occurring within Nathan last 10 years: NO If all of Nathan above answers are "NO", then may proceed with Cephalosporin use.   . Atorvastatin Other (See Comments)    No energy, just felt bad    . Pravastatin Other (See  Comments)    myalgias  . Hydrocodone Other (See Comments)    "swimmy headed" after use  . Tomato     Presumed allergy  . Uloric [Febuxostat] Other (See Comments)    Would avoid, worsening gout sx with use.   . Vancomycin Other (See Comments)    Red man syndrome- tolerated with slow run and benadryl use  . Biaxin [Clarithromycin] Diarrhea  . Doxycycline Other (See Comments)    GI upset    ROS:  Out of a complete 14 system review of symptoms, [name] complains only of Nathan following symptoms, and all other reviewed systems are negative.  Hand numbness Walking difficulty Joint pains  Blood pressure 131/79, pulse 72, height 5\' 6"  (1.676 m), weight 216 lb (98 kg).  Physical Exam  General: [name] is alert and cooperative at Nathan time of Nathan examination.  [name] is markedly obese.  Eyes: Pupils are equal, round, and reactive to light. Discs are flat bilaterally.  Neck: Nathan neck is supple, no carotid bruits are noted.  Respiratory: Nathan respiratory examination is clear.  Cardiovascular: Nathan cardiovascular examination reveals a regular rate and rhythm, no obvious murmurs or rubs are noted.  Skin: Extremities are with 1-2+ edema below Nathan knees.  Neurologic Exam  Mental status: [name] is alert and oriented x 3 at Nathan time of Nathan examination. [name] has apparent normal recent and remote memory, with an apparently normal attention span and concentration ability.  Cranial nerves: Facial symmetry is present. There is good sensation of Nathan face to pinprick and soft touch bilaterally. Nathan strength of Nathan facial muscles and Nathan muscles to head turning and shoulder shrug are normal bilaterally. Speech is well enunciated, no aphasia or dysarthria is noted. Extraocular movements are full. Visual fields are full. Nathan tongue is midline, and [name] has symmetric elevation of Nathan soft palate. No obvious hearing deficits are noted.  Motor: Nathan motor testing reveals 5  over 5 strength of all 4 extremities. Good symmetric motor tone is noted throughout.  Sensory: Sensory testing is intact to pinprick, soft touch, vibration sensation, and position sense on Nathan  upper extremities.  With Nathan lower extremities there is a stocking pattern pinprick sensory deficit in Nathan distal two thirds of Nathan legs bilaterally with severe impairment of vibration and position sense in both feet.  No evidence of extinction is noted.  Coordination: Cerebellar testing reveals good finger-nose-finger and heel-to-shin bilaterally.  Gait and station: Gait is somewhat wide-based, [name] will walk independently.  Tandem gait is unsteady.  Romberg is negative but is slightly unsteady.  Reflexes: Deep tendon reflexes are symmetric, but are depressed bilaterally.  Nathan ankle jerk reflexes are absent bilaterally.  Toes are downgoing bilaterally.   Assessment/Plan:  1.  Diabetes  2.  History of vitamin B12 deficiency  3.  Hand numbness, possible carpal tunnel syndrome  4.  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  He will be set up for nerve conduction studies on both arms and 1 leg.  If he does have carpal tunnel syndrome, this can be treated.  [name] has some mild gait instability associated with his neuropathy.  Jill Alexanders MD 09/07/2019 10:05 AM  [facility] [address] [place], [place] [address]  Phone [phone] Fax [phone]

## 2019-09-11 ENCOUNTER — Encounter: Payer: Self-pay | Admitting: Family Medicine

## 2019-09-11 ENCOUNTER — Ambulatory Visit (INDEPENDENT_AMBULATORY_CARE_PROVIDER_SITE_OTHER): Payer: Medicare Other | Admitting: Family Medicine

## 2019-09-11 ENCOUNTER — Ambulatory Visit
Admission: RE | Admit: 2019-09-11 | Discharge: 2019-09-11 | Disposition: A | Discharge status: Home / Self Care | Payer: Medicare Other | Source: Ambulatory Visit | Attending: Family Medicine | Admitting: Family Medicine

## 2019-09-11 ENCOUNTER — Telehealth: Payer: Self-pay | Admitting: Family Medicine

## 2019-09-11 ENCOUNTER — Other Ambulatory Visit: Payer: Self-pay

## 2019-09-11 DIAGNOSIS — E119 Type 2 diabetes mellitus without complications: Secondary | ICD-10-CM | POA: Diagnosis not present

## 2019-09-11 DIAGNOSIS — R21 Rash and other nonspecific skin eruption: Secondary | ICD-10-CM | POA: Diagnosis not present

## 2019-09-11 DIAGNOSIS — E538 Deficiency of other specified B group vitamins: Secondary | ICD-10-CM | POA: Diagnosis not present

## 2019-09-11 DIAGNOSIS — M109 Gout, unspecified: Secondary | ICD-10-CM | POA: Diagnosis not present

## 2019-09-11 DIAGNOSIS — M25562 Pain in left knee: Secondary | ICD-10-CM

## 2019-09-11 DIAGNOSIS — I2510 Atherosclerotic heart disease of native coronary artery without angina pectoris: Secondary | ICD-10-CM | POA: Diagnosis not present

## 2019-09-11 LAB — MULTIPLE MYELOMA PANEL, SERUM
Albumin SerPl Elph-Mcnc: 3.6 g/dL (ref 2.9–4.4)
Albumin/Glob SerPl: 1.1 (ref 0.7–1.7)
Alpha 1: 0.2 g/dL (ref 0.0–0.4)
Alpha2 Glob SerPl Elph-Mcnc: 0.8 g/dL (ref 0.4–1.0)
B-Globulin SerPl Elph-Mcnc: 1.2 g/dL (ref 0.7–1.3)
Gamma Glob SerPl Elph-Mcnc: 1.2 g/dL (ref 0.4–1.8)
Globulin, Total: 3.5 g/dL (ref 2.2–3.9)
IgA/Immunoglobulin A, Serum: 252 mg/dL (ref 61–437)
IgG (Immunoglobin G), Serum: 1147 mg/dL (ref 603–1613)
IgM (Immunoglobulin M), Srm: 95 mg/dL (ref 15–143)
Total Protein: 7.1 g/dL (ref 6.0–8.5)

## 2019-09-11 LAB — B. BURGDORFI ANTIBODIES: Lyme IgG/IgM Ab: <0.91 {ISR} (ref 0.00–0.90)

## 2019-09-11 LAB — ANGIOTENSIN CONVERTING ENZYME: Angio Convert Enzyme: 5 U/L — ABNORMAL LOW (ref 14–82)

## 2019-09-11 LAB — ANA W/REFLEX: Anti Nuclear Antibody (ANA): NEGATIVE

## 2019-09-11 MED ORDER — CYANOCOBALAMIN 1000 MCG/ML IJ SOLN
INTRAMUSCULAR | Status: DC
Start: 2019-09-11 — End: 2019-09-11

## 2019-09-11 MED ORDER — CYANOCOBALAMIN 1000 MCG/ML IJ SOLN
INTRAMUSCULAR | Status: DC
Start: 2019-09-11 — End: 2020-06-20

## 2019-09-11 NOTE — Progress Notes (Signed)
This visit occurred during the SARS-CoV-2 public health emergency.  Safety protocols were in place, including screening questions prior to the visit, additional usage of staff PPE, and extensive cleaning of exam room while observing appropriate contact time as indicated for disinfecting solutions.  He had Ortho f/u in the meantime.    He had MRI L knee:  1. Severe complex tear of the body of the medial meniscus extending into the posterior horn with peripheral meniscal extrusion. 2. Horizontal tear of the body and anterior horn of the lateral meniscus extending to the superior articular surface and into the inferior surface of the posterior horn. 3. Tricompartmental cartilage abnormalities as described above. 4. Large amount of complex fluid deep to the MCL which appears fairly well-defined which may reflect a subacute hematoma or hemorrhagic bursitis.  He had concurrent gout flare but his pain is some better in the meantime.  He is considering f/u with ortho, d/w pt. I will defer to patient.  He agrees.  He had allergy f/u in the meantime.  He is awaiting f/u labs from allergy clinic.  He is currently avoiding tomatoes.  No sx in the meantime.    He had cardiology f/u re: murmur with AS on echo.    B12 deficiency.  Due for labs.  See notes on labs.  Diabetes:  No meds.   Hypoglycemic episodes: no Hyperglycemic episodes: no Feet problems: occ gout flare, no tingling but dec sensation.   Blood Sugars averaging: 110-120 in the AMs, occ lower later in the day.   eye exam within last year: yes, Dr. Sabra Heck at [facility].   Labs pending.   Meds, vitals, and allergies reviewed.   ROS: Per HPI unless specifically indicated in ROS section   GEN: nad, alert and oriented HEENT: ncat NECK: supple w/o LA CV: IRR, not tachy, SEM noted.   PULM: ctab, no inc wob ABD: soft, +bs EXT: no edema SKIN: well perfused.

## 2019-09-11 NOTE — Telephone Encounter (Signed)
MRI shows a lot of arthritis in the knee.  There are also tears of the meniscus cartilages.  If pain doesn't improve, could contemplate referral for arthroscopic surgery, but with the amount of arthritis, arthroscopic surgery will sometimes make his pain worse.

## 2019-09-11 NOTE — Patient Instructions (Addendum)
Labs today.  If you start antibiotics, then update the coumadin clinic.   I'll await the ortho and neurology notes.   Don't change your meds for now.  Take care.  Glad to see you.

## 2019-09-12 ENCOUNTER — Telehealth: Payer: Self-pay | Admitting: *Deleted

## 2019-09-12 ENCOUNTER — Telehealth: Payer: Self-pay | Admitting: Family Medicine

## 2019-09-12 LAB — CBC WITH DIFFERENTIAL/PLATELET
Basophils Absolute: 0.1 10*3/uL (ref 0.0–0.1)
Basophils Relative: 1 % (ref 0.0–3.0)
Eosinophils Absolute: 0.1 10*3/uL (ref 0.0–0.7)
Eosinophils Relative: 1.3 % (ref 0.0–5.0)
HCT: 46.7 % (ref 39.0–52.0)
Hemoglobin: 15.7 g/dL (ref 13.0–17.0)
Lymphocytes Relative: 24.4 % (ref 12.0–46.0)
Lymphs Abs: 2.3 10*3/uL (ref 0.7–4.0)
MCHC: 33.7 g/dL (ref 30.0–36.0)
MCV: 89.3 fl (ref 78.0–100.0)
Monocytes Absolute: 0.8 10*3/uL (ref 0.1–1.0)
Monocytes Relative: 8.3 % (ref 3.0–12.0)
Neutro Abs: 6 10*3/uL (ref 1.4–7.7)
Neutrophils Relative %: 65 % (ref 43.0–77.0)
Platelets: 126 10*3/uL — ABNORMAL LOW (ref 150.0–400.0)
RBC: 5.23 Mil/uL (ref 4.22–5.81)
RDW: 14.9 % (ref 11.5–15.5)
WBC: 9.2 10*3/uL (ref 4.0–10.5)

## 2019-09-12 LAB — HEMOGLOBIN A1C: Hgb A1c MFr Bld: 7.7 % — ABNORMAL HIGH (ref 4.6–6.5)

## 2019-09-12 LAB — VITAMIN B12: Vitamin B-12: 288 pg/mL (ref 211–911)

## 2019-09-12 NOTE — Telephone Encounter (Signed)
Please see phone message - would like response through [facility].

## 2019-09-12 NOTE — Telephone Encounter (Signed)
Daughter called and stated that the pt has started sulfamethaxozole tmp DS 1 po bid for 10 days and will start today. Made aware that the medication will cause the INR to increase and the pt will eat leafy veggies today and Thursday, also pt to take Warfarin 1 tablet until visit on Friday. She verbalized understanding and daughter will bring pt to the visit.

## 2019-09-12 NOTE — Telephone Encounter (Signed)
Patient's [name] called requesting a call back or message to be sent on MyChart with instructions. Cassandria Santee is asking for Dr. Junius Roads to send a message on MyChart to give instructions on gout medication. Patient's daughter wants to know when should patient take and how much dosage to take. Also [name] said patient's knee is doing much better. [name] phone number is [phone].

## 2019-09-13 ENCOUNTER — Encounter: Payer: Self-pay | Admitting: Family Medicine

## 2019-09-13 ENCOUNTER — Other Ambulatory Visit: Payer: Self-pay | Admitting: Family Medicine

## 2019-09-13 DIAGNOSIS — E119 Type 2 diabetes mellitus without complications: Secondary | ICD-10-CM

## 2019-09-13 DIAGNOSIS — E538 Deficiency of other specified B group vitamins: Secondary | ICD-10-CM

## 2019-09-13 NOTE — Assessment & Plan Note (Signed)
I would defer to orthopedics about his knee.  He agrees.

## 2019-09-13 NOTE — Assessment & Plan Note (Signed)
Recheck labs pending.  No meds currently.  See notes on labs.

## 2019-09-13 NOTE — Assessment & Plan Note (Signed)
See notes on labs. 

## 2019-09-15 ENCOUNTER — Other Ambulatory Visit: Payer: Self-pay

## 2019-09-15 ENCOUNTER — Ambulatory Visit (INDEPENDENT_AMBULATORY_CARE_PROVIDER_SITE_OTHER): Payer: Medicare Other | Admitting: *Deleted

## 2019-09-15 DIAGNOSIS — Z5181 Encounter for therapeutic drug level monitoring: Secondary | ICD-10-CM

## 2019-09-15 DIAGNOSIS — I4819 Other persistent atrial fibrillation: Secondary | ICD-10-CM | POA: Diagnosis not present

## 2019-09-15 LAB — POCT INR: INR: 2.5 (ref 2.0–3.0)

## 2019-09-15 NOTE — Patient Instructions (Addendum)
Description   While on bactrim take 1 tablet daily. Eat any extra serving of greens. Call Coumadin clinic for any changes in medications or up coming procedures. [phone].   Normal dose: 1 tablet daily except 1.5 tablets each Sundays and Thursdays.

## 2019-09-20 ENCOUNTER — Ambulatory Visit (INDEPENDENT_AMBULATORY_CARE_PROVIDER_SITE_OTHER): Payer: Medicare Other

## 2019-09-20 ENCOUNTER — Other Ambulatory Visit: Payer: Self-pay

## 2019-09-20 DIAGNOSIS — E538 Deficiency of other specified B group vitamins: Secondary | ICD-10-CM | POA: Diagnosis not present

## 2019-09-20 MED ORDER — CYANOCOBALAMIN 1000 MCG/ML IJ SOLN
1000.0000 ug | Freq: Once | INTRAMUSCULAR | Status: AC
  Administered 2019-09-20: 1000 ug via INTRAMUSCULAR

## 2019-09-20 NOTE — Progress Notes (Signed)
Per orders of Dr. Danise Mina in Dr. Carole Civil absense, injection of B12 given by Randall An. Patient tolerated injection well.

## 2019-09-22 ENCOUNTER — Ambulatory Visit (INDEPENDENT_AMBULATORY_CARE_PROVIDER_SITE_OTHER): Payer: Medicare Other

## 2019-09-22 ENCOUNTER — Other Ambulatory Visit: Payer: Self-pay

## 2019-09-22 DIAGNOSIS — I4819 Other persistent atrial fibrillation: Secondary | ICD-10-CM | POA: Diagnosis not present

## 2019-09-22 DIAGNOSIS — Z5181 Encounter for therapeutic drug level monitoring: Secondary | ICD-10-CM | POA: Diagnosis not present

## 2019-09-22 LAB — POCT INR: INR: 2 (ref 2.0–3.0)

## 2019-09-22 NOTE — Patient Instructions (Signed)
Continue taking 1 tablet daily except 1.5 tablets each Sundays and Thursdays. Eat any extra serving of greens over the weekend. Call Coumadin clinic for any changes in medications or up coming procedures. [phone].

## 2019-09-23 ENCOUNTER — Other Ambulatory Visit: Payer: Medicare Other

## 2019-09-29 ENCOUNTER — Other Ambulatory Visit: Payer: Self-pay

## 2019-09-29 ENCOUNTER — Ambulatory Visit (INDEPENDENT_AMBULATORY_CARE_PROVIDER_SITE_OTHER): Payer: Medicare Other

## 2019-09-29 DIAGNOSIS — Z5181 Encounter for therapeutic drug level monitoring: Secondary | ICD-10-CM | POA: Diagnosis not present

## 2019-09-29 DIAGNOSIS — I4819 Other persistent atrial fibrillation: Secondary | ICD-10-CM | POA: Diagnosis not present

## 2019-09-29 LAB — POCT INR: INR: 2.5 (ref 2.0–3.0)

## 2019-09-29 NOTE — Patient Instructions (Signed)
Continue taking 1 tablet daily except 1.5 tablets each Sundays and Thursdays. Recheck INR 4 weeks .  Call Coumadin clinic for any changes in medications or up coming procedures. [phone].

## 2019-10-04 ENCOUNTER — Ambulatory Visit (INDEPENDENT_AMBULATORY_CARE_PROVIDER_SITE_OTHER): Payer: Medicare Other | Admitting: Neurology

## 2019-10-04 ENCOUNTER — Other Ambulatory Visit: Payer: Self-pay

## 2019-10-04 ENCOUNTER — Encounter: Payer: Self-pay | Admitting: Neurology

## 2019-10-04 DIAGNOSIS — R202 Paresthesia of skin: Secondary | ICD-10-CM

## 2019-10-04 DIAGNOSIS — E1142 Type 2 diabetes mellitus with diabetic polyneuropathy: Secondary | ICD-10-CM | POA: Insufficient documentation

## 2019-10-04 HISTORY — DX: Type 2 diabetes mellitus with diabetic polyneuropathy: E11.42

## 2019-10-04 NOTE — Procedures (Signed)
     HISTORY:  [name] is an 86 year old gentleman with history of diabetes who reports numbness in the hands bilaterally.  He has some numbness in the feet but not to a significant degree.  Is having trouble using his hands because of the lack of sensation.  He is being evaluated for a possible neuropathy.  NERVE CONDUCTION STUDIES:  Nerve conduction studies were performed on both upper extremities.  The distal motor latency for the right median nerve was prolonged with a low motor amplitude.  Distal motor latency and motor amplitude for the left median nerve was normal.  The distal motor latency for the left ulnar nerve was prolonged with a low motor amplitude and the right ulnar nerve reveals a normal distal motor latency with a low motor amplitude.  The nerve conduction velocities for the median and ulnar nerves were normal bilaterally.  Sensory latencies for the radial nerves were normal bilaterally but were absent for the median and ulnar nerves bilaterally.  The F-wave latencies for the ulnar nerves were prolonged bilaterally.  Nerve conduction studies were performed on the right lower extremity.  The distal motor latencies for the right peroneal and posterior tibial nerves were normal with low motor amplitudes seen for these nerves.  Slowing was seen for these nerves as well.  The sensory latencies for the sural and peroneal nerves were unobtainable.  The F-wave latency of the right posterior tibial nerve was markedly prolonged.  EMG STUDIES:  EMG study was performed on the left upper extremity:  The first dorsal interosseous muscle reveals 2 to 4 K units with full recruitment. No fibrillations or positive waves were noted. The abductor pollicis brevis muscle reveals 2 to 4 K units with decreased recruitment. No fibrillations or positive waves were noted. The extensor indicis proprius muscle reveals 1 to 3 K units with full recruitment. No fibrillations or positive waves were  noted. The pronator teres muscle reveals 2 to 3 K units with full recruitment. No fibrillations or positive waves were noted. The biceps muscle reveals 1 to 2 K units with full recruitment. No fibrillations or positive waves were noted. The triceps muscle reveals 2 to 4 K units with full recruitment. No fibrillations or positive waves were noted. The anterior deltoid muscle reveals 2 to 3 K units with full recruitment. No fibrillations or positive waves were noted. The cervical paraspinal muscles were tested at 2 levels. No abnormalities of insertional activity were seen at either level tested. There was good relaxation.   IMPRESSION:  Nerve conduction studies done on both upper extremities and on the right lower extremity shows evidence of a generalized sensorimotor peripheral neuropathy of moderate severity, likely related to diabetes.  EMG evaluation of the left upper extremity shows no evidence of an overlying cervical radiculopathy.  Nathan Alexanders MD 10/04/2019 3:27 PM  [facility] [address] [place], [place] [address]  Phone [phone] Fax [phone]

## 2019-10-04 NOTE — Progress Notes (Addendum)
This patient comes in for EMG and nerve conduction study.  He has a history of diabetes, he has had trouble with numbness in the hands, he has difficulty buttoning buttons.  Nerve conduction studies showed a generalized neuropathy, the median and ulnar nerves bilaterally are affected in the arms, he has carpal tunnel on the right, cannot diagnose on the left, but there is generalized sensory abnormalities on nerve conductions, not clear that there is any surgical option for this patient, the diabetes has resulted in a neuropathy that has affected his hands in a generalized fashion.     [place]    Nerve / Sites Muscle Latency Ref. Amplitude Ref. Rel Amp Segments Distance Velocity Ref. Area    ms ms mV mV %  cm m/s m/s mVms  R Median - APB     Wrist APB 4.1 ?4.4 1.0 ?4.0 100 Wrist - APB 7   2.1     Upper arm APB 8.0  1.8  175 Upper arm - Wrist 20 51 ?49 8.4  L Median - APB     Wrist APB 3.6 ?4.4 5.7 ?4.0 100 Wrist - APB 9   17.9     Upper arm APB 7.6  4.8  83.1 Upper arm - Wrist 20 51 ?49 17.1  R Ulnar - ADM     Wrist ADM 3.3 ?3.3 2.0 ?6.0 100 Wrist - ADM 7   2.6     B.Elbow ADM 6.5  4.3  212 B.Elbow - Wrist 19 60 ?49 15.1     A.Elbow ADM 8.4  4.2  98 A.Elbow - B.Elbow 10 53 ?49 15.1  L Ulnar - ADM     Wrist ADM 3.8 ?3.3 1.1 ?6.0 100 Wrist - ADM 7   2.9     B.Elbow ADM 7.1  3.3  297 B.Elbow - Wrist 18 54 ?49 15.9     A.Elbow ADM 9.2  3.1  95.7 A.Elbow - B.Elbow 10 49 ?49 13.6  R Peroneal - EDB     Ankle EDB 6.2 ?6.5 0.1 ?2.0 100 Ankle - EDB 9   0.4     Fib head EDB 15.0  0.2  202 Fib head - Ankle 28 32 ?44 0.6     Pop fossa EDB 18.1  0.2  96 Pop fossa - Fib head 10 32 ?44 0.5         Pop fossa - Ankle      R Tibial - AH     Ankle AH 5.7 ?5.8 0.3 ?4.0 100 Ankle - AH 9   0.5     Pop fossa AH 16.5  0.2  61.1 Pop fossa - Ankle 33 31 ?41 0.4                  SNC    Nerve / Sites Rec. Site Peak Lat Ref.  Amp Ref. Segments Distance    ms ms V V  cm  R Radial - Anatomical snuff box  (Forearm)     Forearm Wrist 2.7 ?2.9 15 ?15 Forearm - Wrist 10  L Radial - Anatomical snuff box (Forearm)     Forearm Wrist 2.7 ?2.9 15 ?15 Forearm - Wrist 10  R Sural - Ankle (Calf)     Calf Ankle NR ?4.4 NR ?6 Calf - Ankle 14  R Superficial peroneal - Ankle     Lat leg Ankle NR ?4.4 NR ?6 Lat leg - Ankle 14  R Median - Orthodromic (Dig II, Mid palm)  Dig II Wrist NR ?3.4 NR ?10 Dig II - Wrist 13  L Median - Orthodromic (Dig II, Mid palm)     Dig II Wrist NR ?3.4 NR ?10 Dig II - Wrist 13  R Ulnar - Orthodromic, (Dig V, Mid palm)     Dig V Wrist NR ?3.1 NR ?5 Dig V - Wrist 11  L Ulnar - Orthodromic, (Dig V, Mid palm)     Dig V Wrist NR ?3.1 NR ?5 Dig V - Wrist 41                     F  Wave    Nerve F Lat Ref.   ms ms  R Tibial - AH 125.6 ?56.0  R Ulnar - ADM 33.9 ?32.0  L Ulnar - ADM 32.6 ?32.0

## 2019-10-04 NOTE — Progress Notes (Signed)
Please refer to EMG and nerve conduction procedure note.  

## 2019-10-07 ENCOUNTER — Other Ambulatory Visit: Payer: Self-pay | Admitting: Cardiovascular Disease

## 2019-10-16 ENCOUNTER — Encounter: Payer: Self-pay | Admitting: Family Medicine

## 2019-10-20 ENCOUNTER — Other Ambulatory Visit: Payer: Self-pay | Admitting: Cardiovascular Disease

## 2019-10-20 DIAGNOSIS — I4819 Other persistent atrial fibrillation: Secondary | ICD-10-CM

## 2019-10-20 DIAGNOSIS — I2510 Atherosclerotic heart disease of native coronary artery without angina pectoris: Secondary | ICD-10-CM

## 2019-10-20 DIAGNOSIS — R42 Dizziness and giddiness: Secondary | ICD-10-CM

## 2019-10-24 ENCOUNTER — Ambulatory Visit: Payer: Medicare Other

## 2019-10-26 ENCOUNTER — Other Ambulatory Visit: Payer: Self-pay

## 2019-10-26 ENCOUNTER — Ambulatory Visit (INDEPENDENT_AMBULATORY_CARE_PROVIDER_SITE_OTHER): Payer: Medicare Other

## 2019-10-26 DIAGNOSIS — E538 Deficiency of other specified B group vitamins: Secondary | ICD-10-CM | POA: Diagnosis not present

## 2019-10-26 MED ORDER — CYANOCOBALAMIN 1000 MCG/ML IJ SOLN
1000.0000 ug | Freq: Once | INTRAMUSCULAR | Status: AC
  Administered 2019-10-26: 1000 ug via INTRAMUSCULAR

## 2019-10-26 NOTE — Progress Notes (Signed)
Per orders of Dr. Duncan, injection of vit B12 given by Gigi Onstad. Patient tolerated injection well.  

## 2019-10-27 ENCOUNTER — Ambulatory Visit (INDEPENDENT_AMBULATORY_CARE_PROVIDER_SITE_OTHER): Payer: Medicare Other | Admitting: Pharmacist

## 2019-10-27 DIAGNOSIS — Z5181 Encounter for therapeutic drug level monitoring: Secondary | ICD-10-CM | POA: Diagnosis not present

## 2019-10-27 DIAGNOSIS — I4819 Other persistent atrial fibrillation: Secondary | ICD-10-CM | POA: Diagnosis not present

## 2019-10-27 LAB — POCT INR: INR: 2.3 (ref 2.0–3.0)

## 2019-10-27 NOTE — Patient Instructions (Signed)
Continue taking 1 tablet daily except 1.5 tablets each Sundays and Thursdays. Recheck INR 5 weeks .  Call Coumadin clinic for any changes in medications or up coming procedures. [phone].

## 2019-11-01 DIAGNOSIS — D485 Neoplasm of uncertain behavior of skin: Secondary | ICD-10-CM | POA: Diagnosis not present

## 2019-11-01 DIAGNOSIS — L011 Impetiginization of other dermatoses: Secondary | ICD-10-CM | POA: Diagnosis not present

## 2019-11-01 DIAGNOSIS — L309 Dermatitis, unspecified: Secondary | ICD-10-CM | POA: Diagnosis not present

## 2019-11-01 DIAGNOSIS — L0889 Other specified local infections of the skin and subcutaneous tissue: Secondary | ICD-10-CM | POA: Diagnosis not present

## 2019-11-01 DIAGNOSIS — Z85828 Personal history of other malignant neoplasm of skin: Secondary | ICD-10-CM | POA: Diagnosis not present

## 2019-11-05 ENCOUNTER — Other Ambulatory Visit: Payer: Self-pay | Admitting: Cardiovascular Disease

## 2019-11-05 ENCOUNTER — Other Ambulatory Visit: Payer: Self-pay | Admitting: Orthopedic Surgery

## 2019-11-06 ENCOUNTER — Other Ambulatory Visit: Payer: Self-pay

## 2019-11-06 ENCOUNTER — Ambulatory Visit (INDEPENDENT_AMBULATORY_CARE_PROVIDER_SITE_OTHER): Payer: Medicare Other | Admitting: Family Medicine

## 2019-11-06 ENCOUNTER — Encounter: Payer: Self-pay | Admitting: Family Medicine

## 2019-11-06 DIAGNOSIS — M1A0710 Idiopathic chronic gout, right ankle and foot, without tophus (tophi): Secondary | ICD-10-CM | POA: Diagnosis not present

## 2019-11-06 NOTE — Progress Notes (Signed)
Office Visit Note   Patient: [name]           Date of Birth: 28-Apr-1930           MRN: [id number] Visit Date: 11/06/2019 Requested by: Tonia Ghent, MD [place],  [place] [id number] PCP: Tonia Ghent, MD  Subjective: Chief Complaint  Patient presents with  . Gout    HPI: He is here for follow-up of gout.  He has not had any flareups since last visit.  He is taking allopurinol with colchicine.  No side effects with the medication.              ROS:   All other systems were reviewed and are negative.  Objective: Vital Signs: There were no vitals taken for this visit.  Physical Exam:  General:  Alert and oriented, in no acute distress. Pulm:  Breathing unlabored. Psy:  Normal mood, congruent affect.    Imaging: No results found.  Assessment & Plan: 1.  Gout, doing well on allopurinol and colchicine. -Recheck uric acid level today.  If below 6, we will discontinue colchicine and continue with allopurinol for maintenance.     Procedures: No procedures performed  No notes on file     PMFS History: Patient Active Problem List   Diagnosis Date Noted  . Diabetic peripheral neuropathy ([place]) 10/04/2019  . Food allergy 07/27/2019  . Healthcare maintenance 05/22/2019  . B12 deficiency 05/17/2019  . Skin irritation 04/02/2019  . Osteomyelitis of second toe of right foot ([place])   . Achilles tendinitis 12/05/2017  . Idiopathic chronic gout, unspecified site, without tophus (tophi) 10/06/2017  . Cutaneous abscess of right foot   . Atypical angina ([place]) 11/08/2015  . Acute diastolic heart failure ([place])   . CAD S/P percutaneous coronary angioplasty   . Chronic atrial fibrillation ([place])   . CKD (chronic kidney disease), stage III   . Persistent atrial fibrillation ([place])   . Hypertension   . Hyperlipidemia   . Bradycardia   . Back pain 05/24/2015  . Triceps tendonitis 05/24/2015  . Diabetes mellitus without complication ([place])  09/73/5329  . Dysphagia, pharyngoesophageal phase 09/25/2014  . History of esophageal stricture 09/25/2014  . Chronic anticoagulation 09/25/2014  . Helicobacter pylori duodenitis 09/25/2014  . Cough 03/14/2014  . Benign paroxysmal positional vertigo 03/14/2014  . Encounter for therapeutic drug monitoring 04/12/2013  . Esophageal stricture from GERD 06/08/2011  . Advance care planning 04/20/2011  . OSA on CPAP 09/22/2010  . HYPERCHOLESTEROLEMIA 08/15/2008  . Gout 08/15/2008  . OBESITY 08/15/2008  . Essential hypertension 08/15/2008  . CAD (coronary artery disease), native coronary artery 08/15/2008  . GASTROESOPHAGEAL REFLUX DISEASE 08/15/2008   Past Medical History:  Diagnosis Date  . Bradycardia    a. nocturnal with pauses overnight on tele likely due to OSA 10/2015.  [name] Cancer ([place])    skin cancer  . CKD (chronic kidney disease), stage III   . Coronary artery disease    a. prev nonobst. b. LHC 10/24/15: occlusion of small diffusely diseased OM2 s/p balloon angioplasty, mild nonobstructive disease of mLAD and mRCA, normal LVEDP  . Diabetes mellitus without complication ([place])   . Diabetic peripheral neuropathy ([place]) 10/04/2019  . Diverticulosis   . Esophageal reflux   . Esophageal stricture from GERD 06/08/2011   With stricture at GE junction on EGD, dilated 06/2011   . Gastric ulcer    on EGD 2013  . Gout, unspecified    "  on daily RX" (09/09/2017)  . History of blood transfusion 02/1964   "when I had right little finger cut off"  . Hyperlipidemia   . Hypertension   . Obesity, unspecified   . Orthopnea   . OSA on CPAP   . Paroxysmal nocturnal dyspnea   . Persistent atrial fibrillation ([place])   . Vertigo     Family History  Problem Relation Age of Onset  . Stomach cancer Mother        died in 86's   . Hypertension Father        died in his 52's pna likely dementia  . Dementia Father   . Pneumonia Father   . Heart disease Brother        S/P CABG  . Hypertension Other     . Prostate cancer Neg Hx   . Colon cancer Neg Hx     Past Surgical History:  Procedure Laterality Date  . AMPUTATION Right 03/03/2019   Procedure: RIGHT 2ND TOE AMPUTATION;  Surgeon: [name], MD;  Location: [facility];  Service: Orthopedics;  Laterality: Right;  . BALLOON DILATION N/A 10/22/2014   Procedure: BALLOON DILATION;  Surgeon: [name], MD;  [facility];  Service: Endoscopy;  Laterality: N/A;  . CARDIAC CATHETERIZATION  07/2008  . CARDIAC CATHETERIZATION N/A 10/24/2015   Procedure: Left Heart Cath and Coronary Angiography;  Surgeon: [name], MD;  Location: [facility];  Service: Cardiovascular;  Laterality: N/A;  . CARDIAC CATHETERIZATION N/A 10/24/2015   Procedure: Coronary Balloon Angioplasty;  Surgeon: [name], MD;  Location: [facility];  Service: Cardiovascular;  Laterality: N/A;  . Carotid Dopplers  09/2009   no sig extracranial stenosis and vertebral arteries had antegrade flow  . CATARACT EXTRACTION W/ INTRAOCULAR LENS  IMPLANT, BILATERAL Bilateral   . ESOPHAGOGASTRODUODENOSCOPY (EGD) WITH ESOPHAGEAL DILATION     "I've had it stretched 3 times" (10/23/2015)  . ESOPHAGOGASTRODUODENOSCOPY (EGD) WITH ESOPHAGEAL DILATION  06/2011   /medical hx above  (09/09/2017)  . ESOPHAGOGASTRODUODENOSCOPY (EGD) WITH PROPOFOL N/A 10/22/2014   Procedure: ESOPHAGOGASTRODUODENOSCOPY (EGD) WITH PROPOFOL;  Surgeon: [name], MD;  [facility];  Service: Endoscopy;  Laterality: N/A;  . FINGER SURGERY Right 02/1964   "cut little finger off; had it reattached"  . I & D EXTREMITY Right 09/10/2017   Procedure: RIGHT FOOT DEBRIDEMENT;  Surgeon: [name], MD;  Location: [facility];  Service: Orthopedics;  Laterality: Right;  . MRI of brain  09/2009   chronic microvascular ischemia  . OSA Sleep Study     per Dr. Brett Fairy  . SHOULDER ARTHROSCOPY W/ ROTATOR CUFF REPAIR Right 2008   Social History   Occupational History  . 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 Smoking status: Former Smoker    Packs/day: 0.12    Years: 4.00    Pack years: 0.48    Types: Cigarettes    Quit date: 03/16/1969    Years since quitting: 50.6  . Smokeless tobacco: Former Systems developer  . Tobacco comment: "chewed when I smoked; smoked 1 pack/week for about 4 years, quit 1971  Vaping Use  . Vaping Use: Never used  Substance and Sexual Activity  . Alcohol use: Not Currently    Alcohol/week: 0.0 standard drinks    Comment: 09/09/2017  "Quit all alcohol in 1970's"  . Drug use: Never  . Sexual activity: Not on file

## 2019-11-07 ENCOUNTER — Telehealth: Payer: Self-pay | Admitting: Family Medicine

## 2019-11-07 ENCOUNTER — Encounter: Payer: Self-pay | Admitting: Family Medicine

## 2019-11-07 LAB — BASIC METABOLIC PANEL
BUN/Creatinine Ratio: 12 (calc) (ref 6–22)
BUN: 18 mg/dL (ref 7–25)
CO2: 28 mmol/L (ref 20–32)
Calcium: 9.5 mg/dL (ref 8.6–10.3)
Chloride: 101 mmol/L (ref 98–110)
Creat: 1.46 mg/dL — ABNORMAL HIGH (ref 0.70–1.11)
Glucose, Bld: 128 mg/dL — ABNORMAL HIGH (ref 65–99)
Potassium: 4.5 mmol/L (ref 3.5–5.3)
Sodium: 138 mmol/L (ref 135–146)

## 2019-11-07 LAB — URIC ACID: Uric Acid, Serum: 7.9 mg/dL (ref 4.0–8.0)

## 2019-11-07 MED ORDER — FEBUXOSTAT 40 MG PO TABS: 40.0000 mg | ORAL_TABLET | Freq: Every day | ORAL | 6 refills | Status: DC

## 2019-11-07 NOTE — Telephone Encounter (Signed)
Uric acid is still too high at 7.9, and kidney function/creatinine is elevated again.  I'm concerned that allopurinol might be affecting the kidneys, and the dosage is not enough.  I recommend switching from allopurinol to uloric, and continuing with colchicine for another 6-8 weeks, and then rechecking these labs.

## 2019-11-21 DIAGNOSIS — L570 Actinic keratosis: Secondary | ICD-10-CM | POA: Diagnosis not present

## 2019-11-21 DIAGNOSIS — L821 Other seborrheic keratosis: Secondary | ICD-10-CM | POA: Diagnosis not present

## 2019-11-21 DIAGNOSIS — D225 Melanocytic nevi of trunk: Secondary | ICD-10-CM | POA: Diagnosis not present

## 2019-11-21 DIAGNOSIS — Z85828 Personal history of other malignant neoplasm of skin: Secondary | ICD-10-CM | POA: Diagnosis not present

## 2019-11-21 DIAGNOSIS — L300 Nummular dermatitis: Secondary | ICD-10-CM | POA: Diagnosis not present

## 2019-11-21 DIAGNOSIS — L814 Other melanin hyperpigmentation: Secondary | ICD-10-CM | POA: Diagnosis not present

## 2019-11-23 ENCOUNTER — Encounter: Payer: Self-pay | Admitting: Family Medicine

## 2019-11-23 ENCOUNTER — Other Ambulatory Visit: Payer: Self-pay

## 2019-11-23 ENCOUNTER — Ambulatory Visit (INDEPENDENT_AMBULATORY_CARE_PROVIDER_SITE_OTHER): Payer: Medicare Other | Admitting: Family Medicine

## 2019-11-23 VITALS — BP 116/72 | HR 95 | Temp 97.2°F | Ht 66.0 in | Wt 217.6 lb

## 2019-11-23 DIAGNOSIS — I2510 Atherosclerotic heart disease of native coronary artery without angina pectoris: Secondary | ICD-10-CM

## 2019-11-23 DIAGNOSIS — M109 Gout, unspecified: Secondary | ICD-10-CM | POA: Diagnosis not present

## 2019-11-23 DIAGNOSIS — R300 Dysuria: Secondary | ICD-10-CM | POA: Diagnosis not present

## 2019-11-23 LAB — POC URINALSYSI DIPSTICK (AUTOMATED)
Bilirubin, UA: NEGATIVE
Blood, UA: NEGATIVE
Glucose, UA: NEGATIVE
Ketones, UA: NEGATIVE
Leukocytes, UA: NEGATIVE
Nitrite, UA: NEGATIVE
Protein, UA: NEGATIVE
Spec Grav, UA: 1.02 (ref 1.010–1.025)
Urobilinogen, UA: 0.2 E.U./dL
pH, UA: 6 (ref 5.0–8.0)

## 2019-11-23 LAB — BASIC METABOLIC PANEL
BUN: 22 mg/dL (ref 6–23)
CO2: 33 mEq/L — ABNORMAL HIGH (ref 19–32)
Calcium: 9.4 mg/dL (ref 8.4–10.5)
Chloride: 99 mEq/L (ref 96–112)
Creatinine, Ser: 1.52 mg/dL — ABNORMAL HIGH (ref 0.40–1.50)
GFR: 43.61 mL/min — ABNORMAL LOW (ref >60.00)
Glucose, Bld: 72 mg/dL (ref 70–99)
Potassium: 4.3 mEq/L (ref 3.5–5.1)
Sodium: 139 mEq/L (ref 135–145)

## 2019-11-23 LAB — URIC ACID: Uric Acid, Serum: 8.1 mg/dL — ABNORMAL HIGH (ref 4.0–7.8)

## 2019-11-23 MED ORDER — ALLOPURINOL 100 MG PO TABS: 100.0000 mg | ORAL_TABLET | Freq: Every day | ORAL | Status: DC

## 2019-11-23 NOTE — Progress Notes (Signed)
This visit occurred during the SARS-CoV-2 public health emergency.  Safety protocols were in place, including screening questions prior to the visit, additional usage of staff PPE, and extensive cleaning of exam room while observing appropriate contact time as indicated for disinfecting solutions.  He had seen Dr. Junius Roads about gout and renal function. Recheck labs pending.  D/w pt about options.  Had trial of uloric a few years ago.  He is taking colchicine once a day.  He is taking allopurinol at night.    Sugar has been ~110s in the AMs.    Not pain with urination but he doesn't have strong stream early AM.  Stream is better later in they day.  No fevers.  R lower back is sore, with getting in/out of chair.  Locally sore, not radiating into the legs.  Tylenol helps.    Meds, vitals, and allergies reviewed.   ROS: Per HPI unless specifically indicated in ROS section   nad ncat Neck supple, no LA IRR not tachy.  ctab R lower back sore locally.  No rash.  No bruising.  No CVA pain. Midline back not ttp.  Back not ttp o/w.  abd soft, not ttp.   See notes on labs.

## 2019-11-23 NOTE — Patient Instructions (Addendum)
Schedule your B12 visit on the way out.   Go to the lab on the way out.   If you have mychart we'll likely use that to update you.    Don't change the allopurinol or colchicine yet.    I don't suspect a UTI.  Try tylenol along with either heat or ice on your back.  See if that helps and try to keep moving.  Most likely your back pain is from sore muscles in your lower back.   Take care.  Glad to see you.

## 2019-11-26 ENCOUNTER — Other Ambulatory Visit: Payer: Self-pay | Admitting: Family Medicine

## 2019-11-26 DIAGNOSIS — M109 Gout, unspecified: Secondary | ICD-10-CM

## 2019-11-26 MED ORDER — ALLOPURINOL 100 MG PO TABS: 150.0000 mg | ORAL_TABLET | Freq: Every day | ORAL | Status: DC

## 2019-11-26 NOTE — Assessment & Plan Note (Addendum)
He is not yet started Uloric.  Still on allopurinol at this point.  Taking 100 mg a day.  Taking colchicine once a day.  He did not do well on Uloric previously.  I do not suspect a UTI.  Reasonable to recheck his labs today.  See notes on labs.  We will go from there.  He agrees.    I do not suspect ominous pathology with his back pain and he can use heat or ice and try to keep moving and update me as needed.  He agrees.

## 2019-11-28 ENCOUNTER — Telehealth: Payer: Self-pay

## 2019-11-28 ENCOUNTER — Encounter: Payer: Self-pay | Admitting: Family Medicine

## 2019-11-28 NOTE — Telephone Encounter (Signed)
Left detailed message on voicemail of patient's daughter for details of the questions or possibly sending questions through [facility] to be addressed.

## 2019-11-28 NOTE — Telephone Encounter (Signed)
Please see what details you can get or have her send the questions to me as a MyChart message so I can try to address them.  Thanks.

## 2019-11-28 NOTE — Telephone Encounter (Signed)
Patient's daughter contacted the office and states that she would like to speak with Dr. Damita Dunnings directly regarding patient's kidney function, and his medications. SHe states she is very concerned with his kidney function and has some questions for Dr. Damita Dunnings. She can be reached at [phone]

## 2019-11-29 ENCOUNTER — Ambulatory Visit (INDEPENDENT_AMBULATORY_CARE_PROVIDER_SITE_OTHER): Payer: Medicare Other

## 2019-11-29 DIAGNOSIS — E538 Deficiency of other specified B group vitamins: Secondary | ICD-10-CM | POA: Diagnosis not present

## 2019-11-29 MED ORDER — CYANOCOBALAMIN 1000 MCG/ML IJ SOLN
1000.0000 ug | Freq: Once | INTRAMUSCULAR | Status: AC
  Administered 2019-11-29: 1000 ug via INTRAMUSCULAR

## 2019-11-29 NOTE — Progress Notes (Signed)
Per orders of Dr. Danise Mina, in Dr. Josefine Class absence, injection of B12, in right deltoid, given by Loreen Freud. Patient tolerated injection well.

## 2019-11-30 ENCOUNTER — Ambulatory Visit: Payer: Medicare Other

## 2019-12-01 ENCOUNTER — Ambulatory Visit (INDEPENDENT_AMBULATORY_CARE_PROVIDER_SITE_OTHER): Payer: Medicare Other | Admitting: *Deleted

## 2019-12-01 ENCOUNTER — Other Ambulatory Visit: Payer: Self-pay

## 2019-12-01 DIAGNOSIS — I4819 Other persistent atrial fibrillation: Secondary | ICD-10-CM | POA: Diagnosis not present

## 2019-12-01 DIAGNOSIS — Z5181 Encounter for therapeutic drug level monitoring: Secondary | ICD-10-CM

## 2019-12-01 LAB — POCT INR: INR: 2.6 (ref 2.0–3.0)

## 2019-12-01 NOTE — Patient Instructions (Signed)
Description   Continue taking 1 tablet daily except 1.5 tablets each Sundays and Thursdays. Recheck INR 6 weeks .  Call Coumadin clinic for any changes in medications or up coming procedures. [phone].

## 2019-12-06 ENCOUNTER — Ambulatory Visit: Payer: Medicare Other | Admitting: Family Medicine

## 2019-12-08 ENCOUNTER — Other Ambulatory Visit: Payer: Self-pay | Admitting: Cardiovascular Disease

## 2019-12-08 ENCOUNTER — Other Ambulatory Visit: Payer: Self-pay | Admitting: Family Medicine

## 2019-12-08 DIAGNOSIS — M109 Gout, unspecified: Secondary | ICD-10-CM

## 2019-12-13 ENCOUNTER — Ambulatory Visit (INDEPENDENT_AMBULATORY_CARE_PROVIDER_SITE_OTHER): Payer: Medicare Other | Admitting: Family Medicine

## 2019-12-13 ENCOUNTER — Other Ambulatory Visit: Payer: Self-pay

## 2019-12-13 ENCOUNTER — Encounter: Payer: Self-pay | Admitting: Family Medicine

## 2019-12-13 DIAGNOSIS — M1A9XX1 Chronic gout, unspecified, with tophus (tophi): Secondary | ICD-10-CM | POA: Diagnosis not present

## 2019-12-13 DIAGNOSIS — M545 Low back pain, unspecified: Secondary | ICD-10-CM

## 2019-12-13 NOTE — Progress Notes (Signed)
Office Visit Note   Patient: [name]           Date of Birth: 1930-08-18           MRN: [id number] Visit Date: 12/13/2019 Requested by: Tonia Ghent, MD [place],  [place] [id number] PCP: Tonia Ghent, MD  Subjective: Chief Complaint  Patient presents with  . Lower Back - Pain    Last week, the pain in the lower right side of his back was worse. The pain with taking a step would "take his breath" at times. Is a bit better for the last 2-3 days.  [name] discuss gout meds    HPI: He is here with right-sided lower back pain and to discuss gout medications.  His back started bothering him 2 or 3 weeks ago, no injury. He felt pain in the paraspinous muscles with twisting movements. He went to his PCP and urinalysis was negative. His pain is better but he wonders whether a cortisone shot might help.  From a gout standpoint he has not had any attacks. He is on allopurinol 100 mg and colchicine 0.6 mg once daily. He is concerned about increasing his allopurinol dosage due to his renal insufficiency.              ROS:   All other systems were reviewed and are negative.  Objective: Vital Signs: There were no vitals taken for this visit.  Physical Exam:  General:  Alert and oriented, in no acute distress. Pulm:  Breathing unlabored. Psy:  Normal mood, congruent affect. Skin: No rash Low back: He has tenderness in the right-sided paraspinous muscles, he has a symptomatic trigger point there.  Imaging: No results found.  Assessment & Plan: 1. Muscular right-sided low back pain -Steroid injection today. Follow-up as needed.  2 cc 1% lido without epi and 40 mg methylprednisolone injected into trigger point today.  2. Gout -We will continue with his current regimen and recheck uric acid in about 6 weeks. If he starts having more attacks, we may need to switch to Uloric.     Procedures: No procedures performed  No notes on file     PMFS  History: Patient Active Problem List   Diagnosis Date Noted  . Diabetic peripheral neuropathy ([place]) 10/04/2019  . Food allergy 07/27/2019  . Healthcare maintenance 05/22/2019  . B12 deficiency 05/17/2019  . Skin irritation 04/02/2019  . Osteomyelitis of second toe of right foot ([place])   . Achilles tendinitis 12/05/2017  . Idiopathic chronic gout, unspecified site, without tophus (tophi) 10/06/2017  . Cutaneous abscess of right foot   . Atypical angina ([place]) 11/08/2015  . Acute diastolic heart failure ([place])   . CAD S/P percutaneous coronary angioplasty   . Chronic atrial fibrillation ([place])   . CKD (chronic kidney disease), stage III   . Persistent atrial fibrillation ([place])   . Hypertension   . Hyperlipidemia   . Bradycardia   . Back pain 05/24/2015  . Triceps tendonitis 05/24/2015  . Diabetes mellitus without complication ([place]) 25/36/6440  . Dysphagia, pharyngoesophageal phase 09/25/2014  . History of esophageal stricture 09/25/2014  . Chronic anticoagulation 09/25/2014  . Helicobacter pylori duodenitis 09/25/2014  . Cough 03/14/2014  . Benign paroxysmal positional vertigo 03/14/2014  . Encounter for therapeutic drug monitoring 04/12/2013  . Esophageal stricture from GERD 06/08/2011  . Advance care planning 04/20/2011  . OSA on CPAP 09/22/2010  . HYPERCHOLESTEROLEMIA 08/15/2008  . Gout 08/15/2008  . OBESITY  08/15/2008  . Essential hypertension 08/15/2008  . CAD (coronary artery disease), native coronary artery 08/15/2008  . GASTROESOPHAGEAL REFLUX DISEASE 08/15/2008   Past Medical History:  Diagnosis Date  . Bradycardia    a. nocturnal with pauses overnight on tele likely due to OSA 10/2015.  [name] Cancer ([place])    skin cancer  . CKD (chronic kidney disease), stage III   . Coronary artery disease    a. prev nonobst. b. LHC 10/24/15: occlusion of small diffusely diseased OM2 s/p balloon angioplasty, mild nonobstructive disease of mLAD and mRCA, normal LVEDP  . Diabetes  mellitus without complication ([place])   . Diabetic peripheral neuropathy ([place]) 10/04/2019  . Diverticulosis   . Esophageal reflux   . Esophageal stricture from GERD 06/08/2011   With stricture at GE junction on EGD, dilated 06/2011   . Gastric ulcer    on EGD 2013  . Gout, unspecified    "on daily RX" (09/09/2017)  . History of blood transfusion 02/1964   "when I had right little finger cut off"  . Hyperlipidemia   . Hypertension   . Obesity, unspecified   . Orthopnea   . OSA on CPAP   . Paroxysmal nocturnal dyspnea   . Persistent atrial fibrillation ([place])   . Vertigo     Family History  Problem Relation Age of Onset  . Stomach cancer Mother        died in 68's   . Hypertension Father        died in his 96's pna likely dementia  . Dementia Father   . Pneumonia Father   . Heart disease Brother        S/P CABG  . Hypertension Other   . Prostate cancer Neg Hx   . Colon cancer Neg Hx     Past Surgical History:  Procedure Laterality Date  . AMPUTATION Right 03/03/2019   Procedure: RIGHT 2ND TOE AMPUTATION;  Surgeon: [name], MD;  Location: [facility];  Service: Orthopedics;  Laterality: Right;  . BALLOON DILATION N/A 10/22/2014   Procedure: BALLOON DILATION;  Surgeon: [name], MD;  [facility];  Service: Endoscopy;  Laterality: N/A;  . CARDIAC CATHETERIZATION  07/2008  . CARDIAC CATHETERIZATION N/A 10/24/2015   Procedure: Left Heart Cath and Coronary Angiography;  Surgeon: [name], MD;  Location: [facility];  Service: Cardiovascular;  Laterality: N/A;  . CARDIAC CATHETERIZATION N/A 10/24/2015   Procedure: Coronary Balloon Angioplasty;  Surgeon: [name], MD;  Location: [facility];  Service: Cardiovascular;  Laterality: N/A;  . Carotid Dopplers  09/2009   no sig extracranial stenosis and vertebral arteries had antegrade flow  . CATARACT EXTRACTION W/ INTRAOCULAR LENS  IMPLANT, BILATERAL Bilateral   . ESOPHAGOGASTRODUODENOSCOPY (EGD)  WITH ESOPHAGEAL DILATION     "I've had it stretched 3 times" (10/23/2015)  . ESOPHAGOGASTRODUODENOSCOPY (EGD) WITH ESOPHAGEAL DILATION  06/2011   /medical hx above  (09/09/2017)  . ESOPHAGOGASTRODUODENOSCOPY (EGD) WITH PROPOFOL N/A 10/22/2014   Procedure: ESOPHAGOGASTRODUODENOSCOPY (EGD) WITH PROPOFOL;  Surgeon: [name], MD;  [facility];  Service: Endoscopy;  Laterality: N/A;  . FINGER SURGERY Right 02/1964   "cut little finger off; had it reattached"  . I & D EXTREMITY Right 09/10/2017   Procedure: RIGHT FOOT DEBRIDEMENT;  Surgeon: [name], MD;  Location: [facility];  Service: Orthopedics;  Laterality: Right;  . MRI of brain  09/2009   chronic microvascular ischemia  . OSA Sleep Study  per Dr. Brett Fairy  . SHOULDER ARTHROSCOPY W/ ROTATOR CUFF REPAIR Right 2008   Social History   Occupational History  . 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 Smoking status: Former Smoker    Packs/day: 0.12    Years: 4.00    Pack years: 0.48    Types: Cigarettes    Quit date: 03/16/1969    Years since quitting: 50.7  . Smokeless tobacco: Former Systems developer  . Tobacco comment: "chewed when I smoked; smoked 1 pack/week for about 4 years, quit 1971  Vaping Use  . Vaping Use: Never used  Substance and Sexual Activity  . Alcohol use: Not Currently    Alcohol/week: 0.0 standard drinks    Comment: 09/09/2017  "Quit all alcohol in 1970's"  . Drug use: Never  . Sexual activity: Not on file

## 2019-12-22 DIAGNOSIS — Z23 Encounter for immunization: Secondary | ICD-10-CM | POA: Diagnosis not present

## 2020-01-01 ENCOUNTER — Other Ambulatory Visit: Payer: Self-pay | Admitting: Orthopedic Surgery

## 2020-01-01 DIAGNOSIS — M109 Gout, unspecified: Secondary | ICD-10-CM

## 2020-01-02 ENCOUNTER — Ambulatory Visit: Payer: Medicare Other

## 2020-01-02 ENCOUNTER — Other Ambulatory Visit: Payer: Self-pay

## 2020-01-09 ENCOUNTER — Other Ambulatory Visit: Payer: Self-pay

## 2020-01-09 ENCOUNTER — Ambulatory Visit (INDEPENDENT_AMBULATORY_CARE_PROVIDER_SITE_OTHER): Payer: Medicare Other

## 2020-01-09 DIAGNOSIS — E538 Deficiency of other specified B group vitamins: Secondary | ICD-10-CM

## 2020-01-09 MED ORDER — CYANOCOBALAMIN 1000 MCG/ML IJ SOLN
1000.0000 ug | Freq: Once | INTRAMUSCULAR | Status: AC
  Administered 2020-01-09: 1000 ug via INTRAMUSCULAR

## 2020-01-09 NOTE — Progress Notes (Signed)
Per orders of Dr. Duncan, injection of vitamin b12 given by Silver Achey. Patient tolerated injection well. 

## 2020-01-12 ENCOUNTER — Ambulatory Visit (INDEPENDENT_AMBULATORY_CARE_PROVIDER_SITE_OTHER): Payer: Medicare Other | Admitting: *Deleted

## 2020-01-12 ENCOUNTER — Other Ambulatory Visit: Payer: Self-pay

## 2020-01-12 DIAGNOSIS — I4819 Other persistent atrial fibrillation: Secondary | ICD-10-CM | POA: Diagnosis not present

## 2020-01-12 DIAGNOSIS — Z5181 Encounter for therapeutic drug level monitoring: Secondary | ICD-10-CM

## 2020-01-12 LAB — POCT INR: INR: 2.1 (ref 2.0–3.0)

## 2020-01-12 NOTE — Patient Instructions (Signed)
Description   Continue taking 1 tablet daily except 1.5 tablets each Sundays and Thursdays. Recheck INR 6 weeks. Call Coumadin clinic for any changes in medications or up coming procedures. [phone].

## 2020-01-15 DIAGNOSIS — Z23 Encounter for immunization: Secondary | ICD-10-CM | POA: Diagnosis not present

## 2020-02-02 ENCOUNTER — Telehealth: Payer: Self-pay

## 2020-02-02 NOTE — Telephone Encounter (Signed)
Called and spoke with patient to inform him that Dr. Damita Dunnings has filled out the paperwork for the Patient Assistant Program and it is at the front desk ready for pick up from the office. Patient verbalized understanding.

## 2020-02-14 NOTE — Telephone Encounter (Signed)
Received fax from [employer] that stated that they were missing documentation. Per previous note, paperwork was ready for patient to pick up. Called and spoke with patient who stated that they did pick up the paperwork and that his family member faxed the paperwork back to the company. Informed patient to reach back out to the office if he has any other issues. Patient verbalized understanding.

## 2020-02-16 IMAGING — CT CT ABD-PELV W/ CM
2 of 5 series · 17 of 46 positions shown, 19 images · IV contrast (APPLIED)
Comparison: 12/05/2015 CT

CLINICAL DATA: 86-year-old male with acute abdominal pain for 4
days.

EXAM:
CT ABDOMEN AND PELVIS WITH CONTRAST
TECHNIQUE: Multidetector CT imaging of the abdomen and pelvis was performed
using the standard protocol following bolus administration of
intravenous contrast.
CONTRAST:  80mL OMNIPAQUE IOHEXOL 300 MG/ML  SOLN

[Series 3: abd/ pelvis 5.0 i30f 2 · axial · 0.85mm/px · z∈[-512,-97]mm · 14 of 95 slices shown, 16 images]
[im 6/95  soft-tissue]
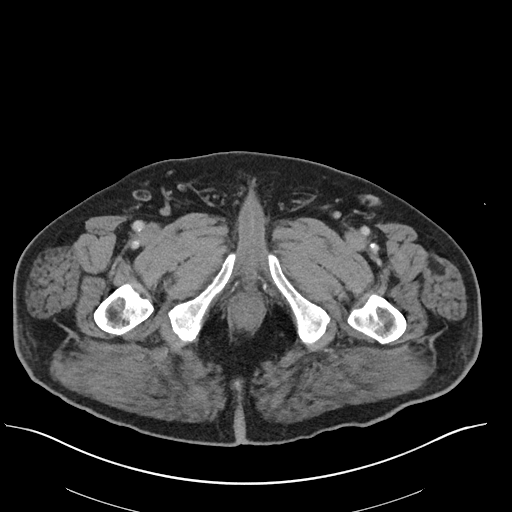
[im 6/95  bone]
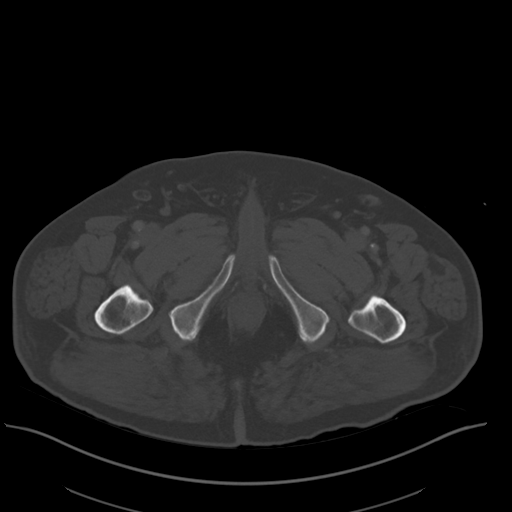
[im 12/95  soft-tissue]
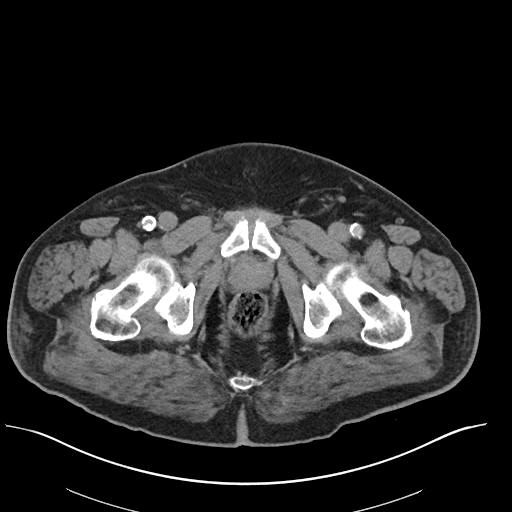
[im 17/95  soft-tissue]
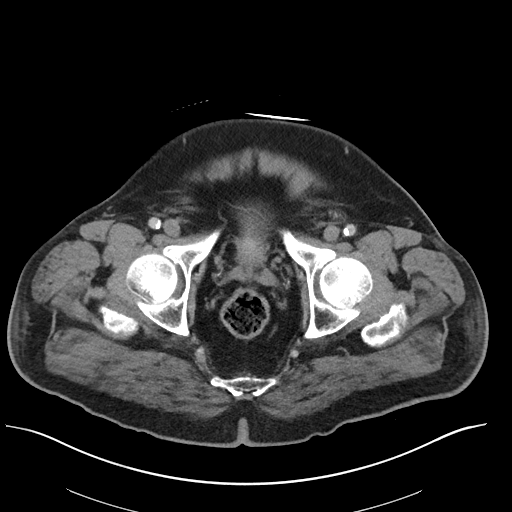
[im 28/95  soft-tissue]
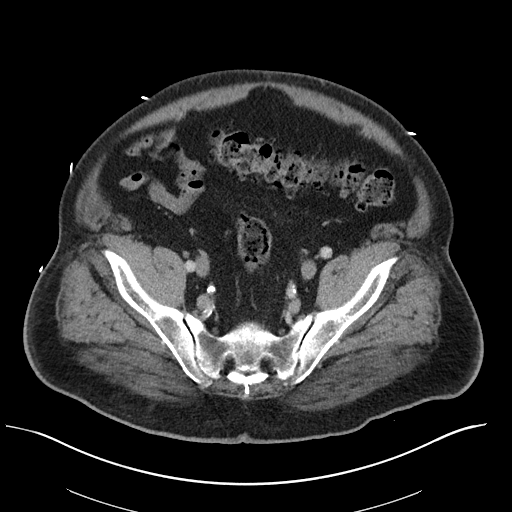
[im 34/95  soft-tissue]
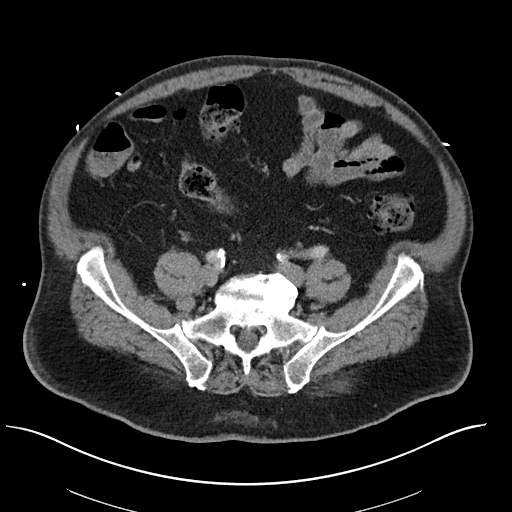
[im 39/95  soft-tissue]
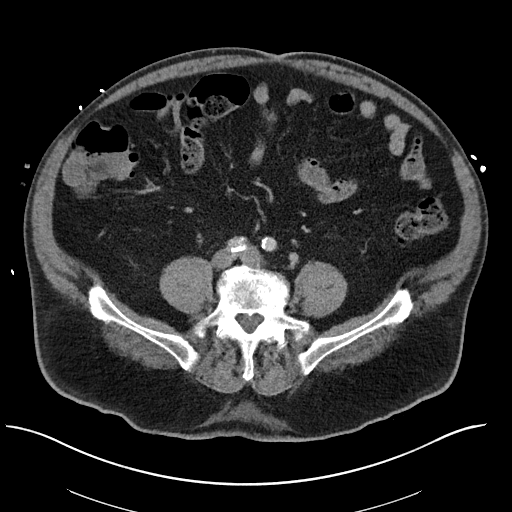
[im 45/95  soft-tissue]
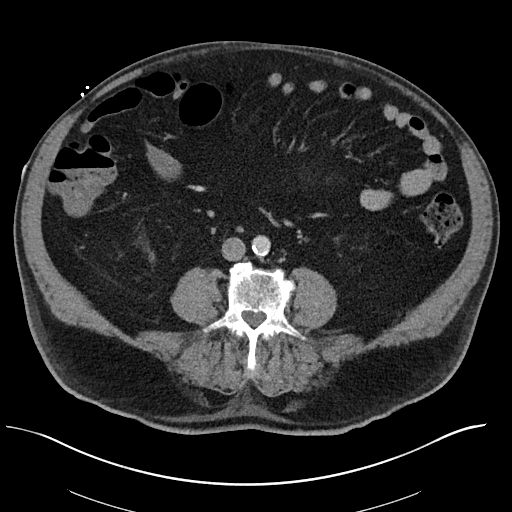
[im 50/95  soft-tissue]
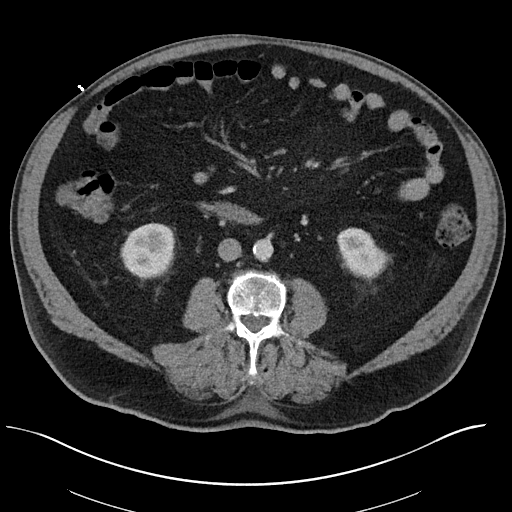
[im 56/95  soft-tissue]
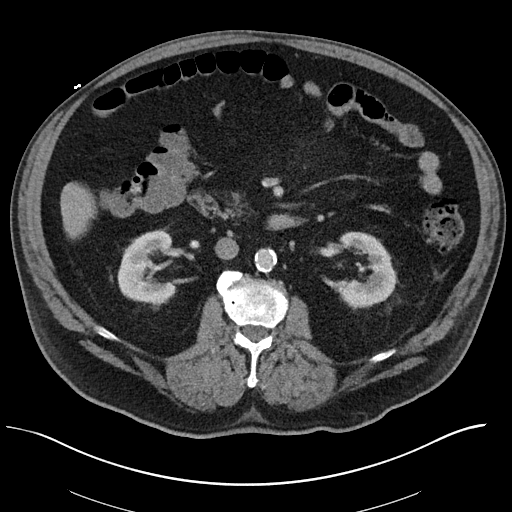
[im 56/95  bone]
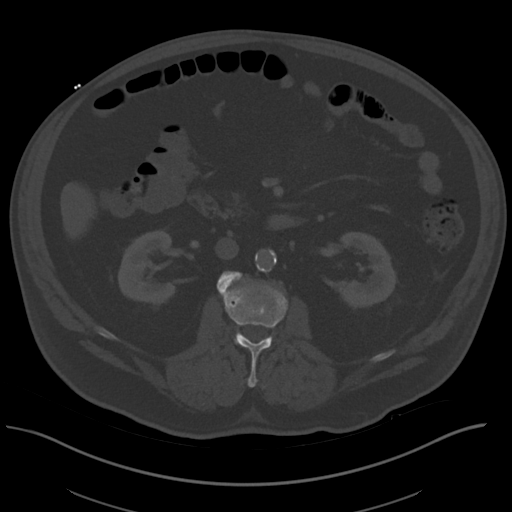
[im 61/95  soft-tissue]
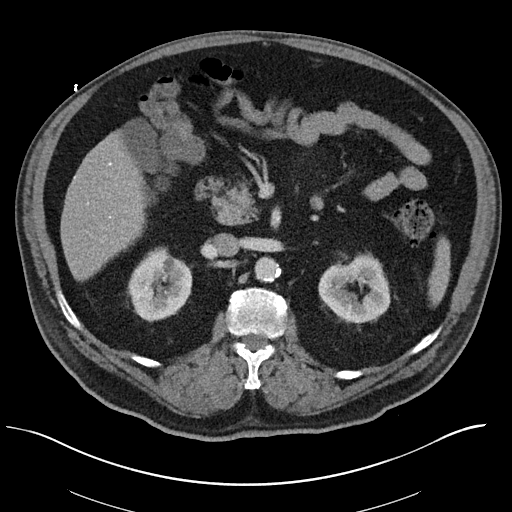
[im 72/95  soft-tissue]
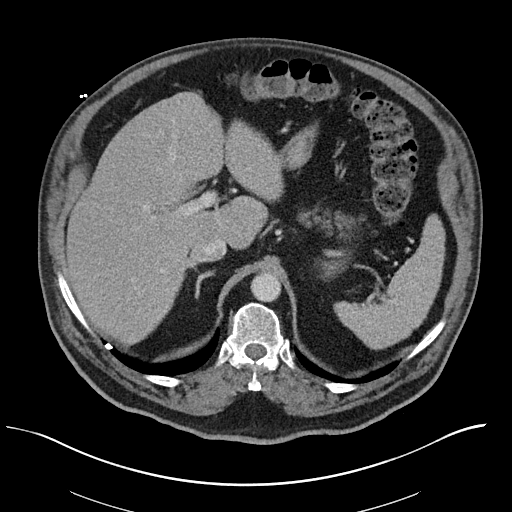
[im 78/95  soft-tissue]
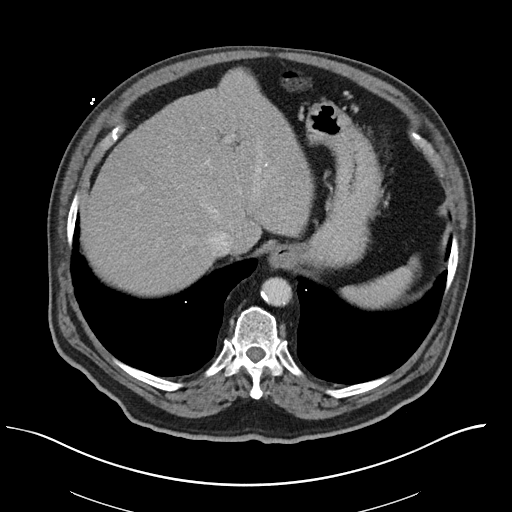
[im 83/95  soft-tissue]
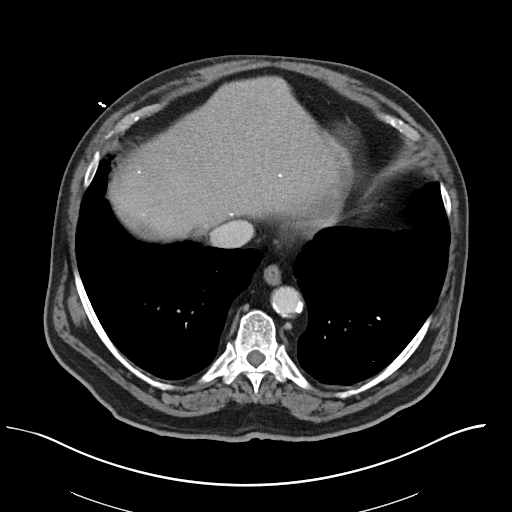
[im 89/95  soft-tissue]
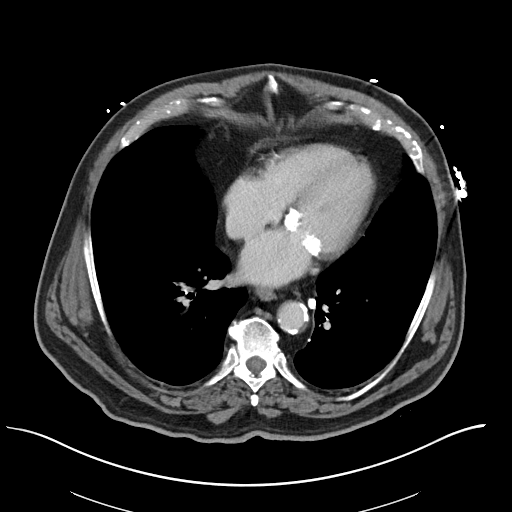

[Series 6: coronal soft tissue · coronal · 0.78mm/px · 3 of 114 slices shown]
[im 38/114  soft-tissue]
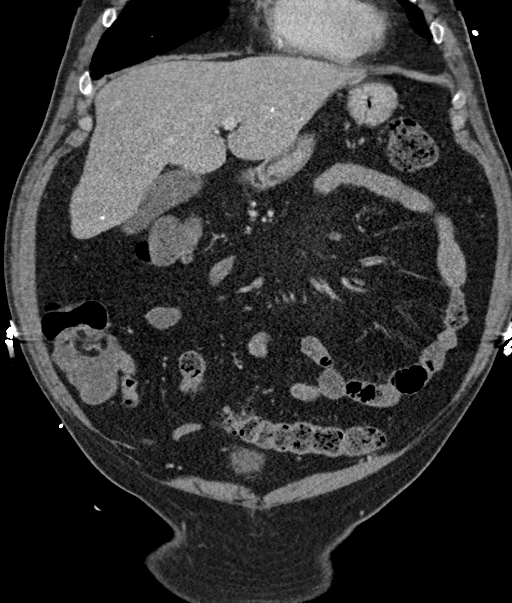
[im 51/114  soft-tissue]
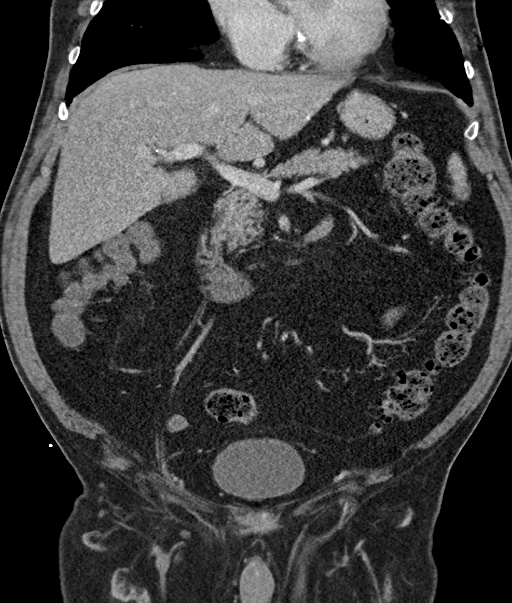
[im 63/114  soft-tissue]
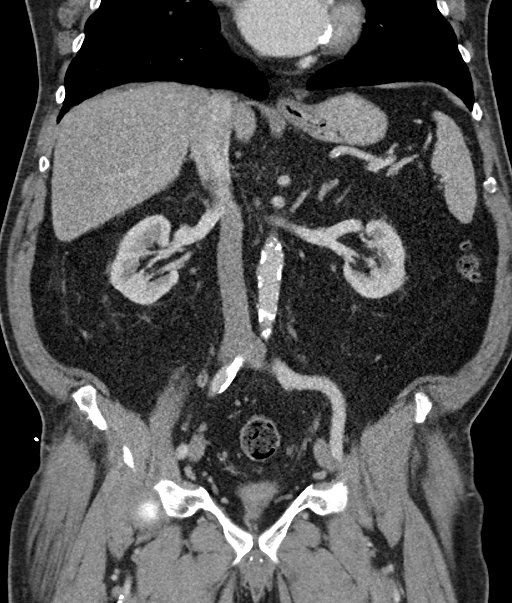

[17 of 46 positions shown; findings below may reference images not displayed]

FINDINGS: Lower chest: No acute abnormality. Calcified pulmonary granulomas
again noted.

Hepatobiliary: The liver and gallbladder are unremarkable except for
calcified hepatic granulomas. No biliary dilatation.

Pancreas: Unremarkable

Spleen: Unremarkable

Adrenals/Urinary Tract: The kidneys, adrenal glands and bladder are
unremarkable.

Stomach/Bowel: Stomach is within normal limits. Appendix appears
normal. No evidence of bowel wall thickening, distention, or
inflammatory changes. Colonic diverticulosis noted without evidence
of diverticulitis.

Vascular/Lymphatic: Aortic atherosclerosis. No enlarged abdominal or
pelvic lymph nodes.

Reproductive: Prostate is unremarkable.

Other: No ascites, focal collection or pneumoperitoneum.

Musculoskeletal: No acute or suspicious bony abnormalities. Mild
degenerative disc disease in the LOWER lumbar spine again noted.
IMPRESSION: 1. No evidence of acute abnormality
2.  Aortic Atherosclerosis ([id number]).

## 2020-02-16 IMAGING — DX DG ABDOMEN ACUTE W/ 1V CHEST
5 series · 5 of 5 positions shown · non-contrast
Comparison: Abdominal and pelvic CT scan December 05, 2015

CLINICAL DATA: Three day history of constipation without abdominal
pain.

EXAM:
DG ABDOMEN ACUTE W/ 1V CHEST

[chest pa]
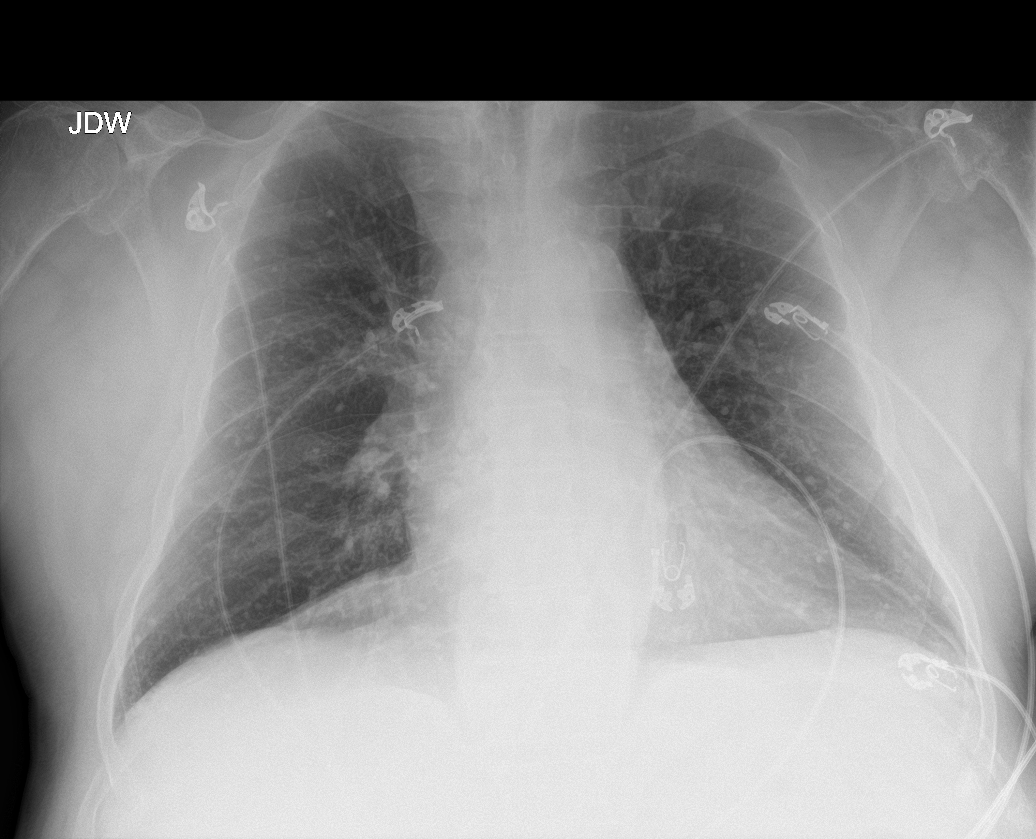

[abdomen erect (1 of 2)]
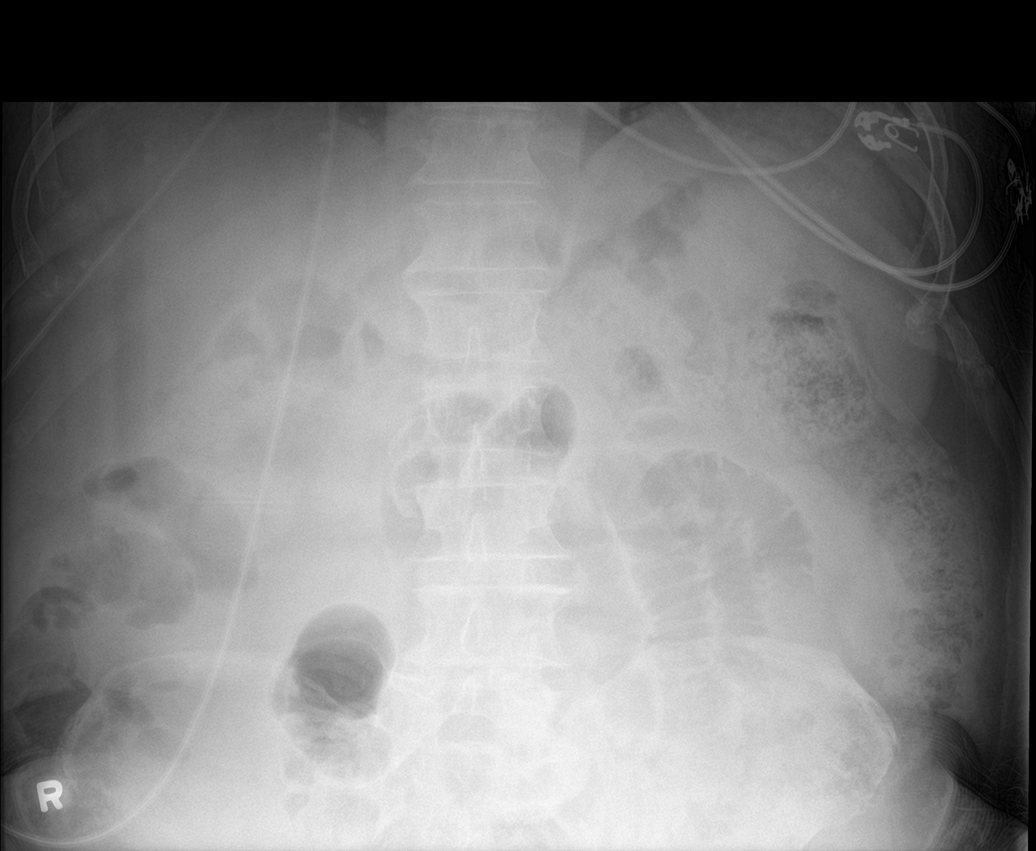

[abdomen supine (1 of 2)]
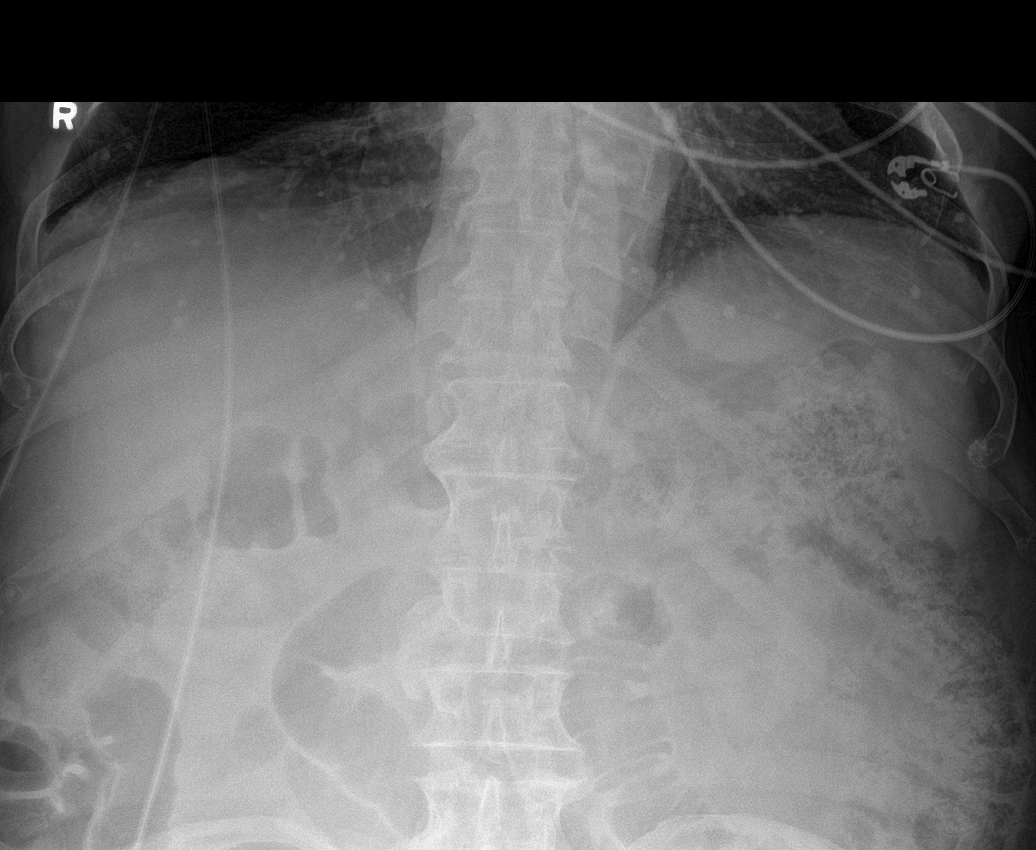

[abdomen supine (2 of 2)]
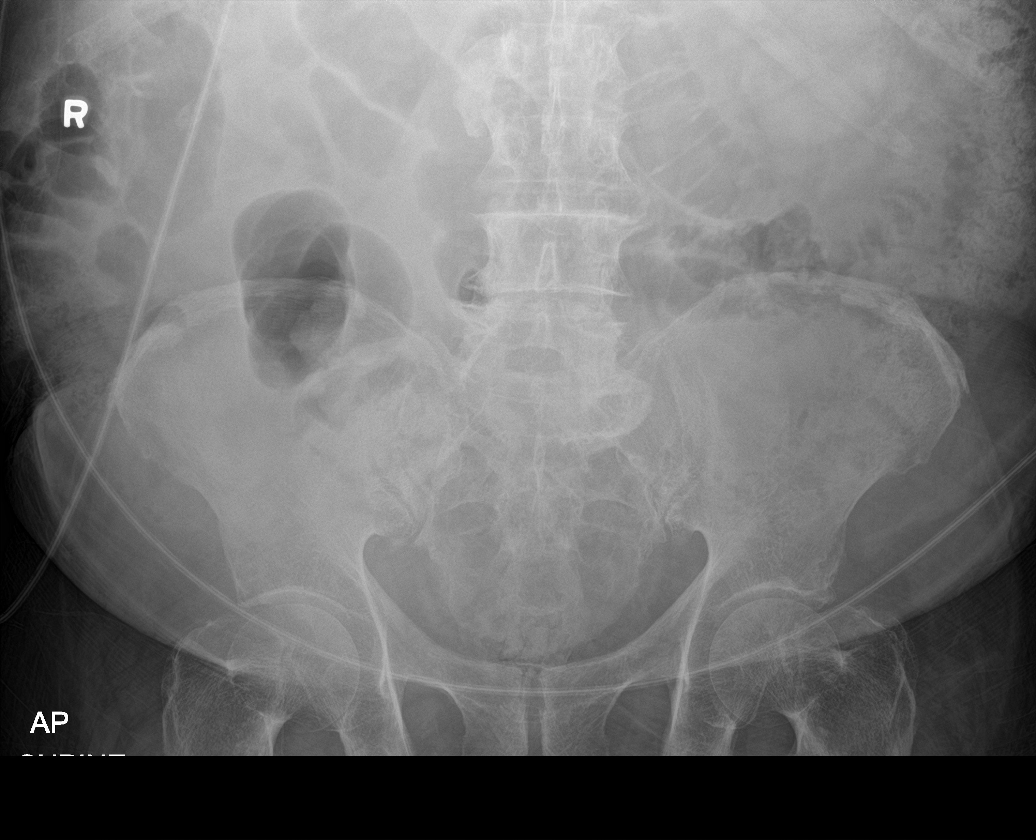

[abdomen erect (2 of 2)]
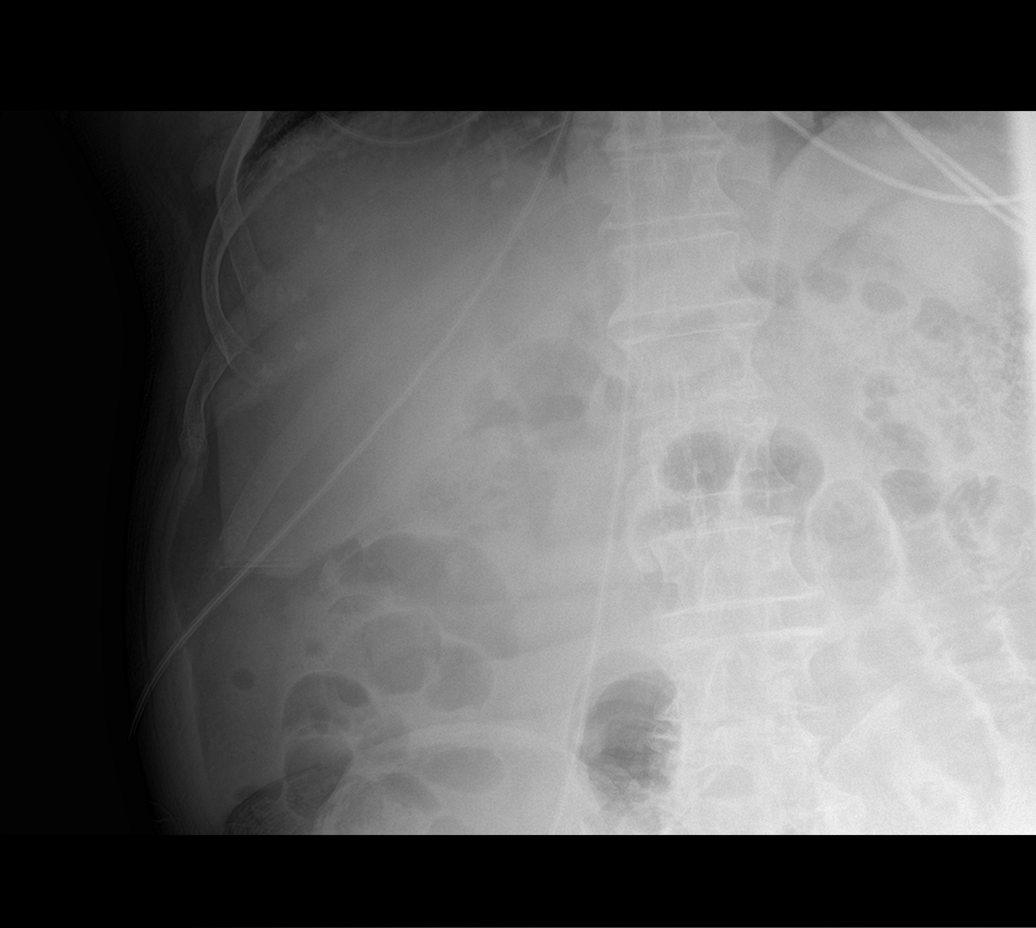

[5 of 5 positions shown; findings below may reference images not displayed]

FINDINGS: The lungs are mildly hyperinflated. There are scattered faintly
calcified nodules within both lungs. These measure up to 3 mm in
diameter. There is no alveolar infiltrate or pleural effusion. The
heart and pulmonary vascularity are normal. The mediastinum is
normal in width. There is calcification in the wall of the aortic
arch.

Within the abdomen there are loops of mildly distended gas-filled
small bowel. There is a moderate amount of stool in the left colon
and rectum. More proximally the colonic stool burden is less. There
is no free extraluminal gas collection. No abnormal intra-abdominal
soft tissue calcifications are observed. There degenerative changes
of the lumbar spine.
IMPRESSION: Moderately increased colonic stool burden greatest on the left.
Proximal to this the colon does not appear distended. There is mild
gaseous distention of several small bowel loops however. There is no
evidence of perforation.

Chronic bronchitic changes.  No acute pneumonia nor CHF.

Thoracic aortic atherosclerosis.

## 2020-02-27 ENCOUNTER — Ambulatory Visit (INDEPENDENT_AMBULATORY_CARE_PROVIDER_SITE_OTHER): Payer: Medicare Other | Admitting: *Deleted

## 2020-02-27 ENCOUNTER — Other Ambulatory Visit: Payer: Self-pay

## 2020-02-27 DIAGNOSIS — Z5181 Encounter for therapeutic drug level monitoring: Secondary | ICD-10-CM | POA: Diagnosis not present

## 2020-02-27 DIAGNOSIS — I4819 Other persistent atrial fibrillation: Secondary | ICD-10-CM | POA: Diagnosis not present

## 2020-02-27 LAB — POCT INR: INR: 2 (ref 2.0–3.0)

## 2020-02-27 NOTE — Patient Instructions (Signed)
Description   Today take 1.5 tablets then continue taking 1 tablet daily except 1.5 tablets each Sundays and Thursdays. Recheck INR 6 weeks. Call Coumadin clinic for any changes in medications or up coming procedures. [phone].

## 2020-02-29 ENCOUNTER — Other Ambulatory Visit: Payer: Self-pay

## 2020-02-29 ENCOUNTER — Ambulatory Visit (INDEPENDENT_AMBULATORY_CARE_PROVIDER_SITE_OTHER): Payer: Medicare Other | Admitting: *Deleted

## 2020-02-29 DIAGNOSIS — E538 Deficiency of other specified B group vitamins: Secondary | ICD-10-CM

## 2020-02-29 MED ORDER — CYANOCOBALAMIN 1000 MCG/ML IJ SOLN
1000.0000 ug | Freq: Once | INTRAMUSCULAR | Status: AC
  Administered 2020-02-29: 1000 ug via INTRAMUSCULAR

## 2020-02-29 MED ORDER — CYANOCOBALAMIN 1000 MCG/ML IJ SOLN: 1000.0000 ug | Freq: Once | INTRAMUSCULAR | Status: DC

## 2020-02-29 NOTE — Progress Notes (Signed)
  Per orders of Dr. Duncan, injection of B12 given by [place], [place] M. Patient tolerated injection well. 

## 2020-03-03 ENCOUNTER — Other Ambulatory Visit: Payer: Self-pay | Admitting: Family Medicine

## 2020-03-03 DIAGNOSIS — R1319 Other dysphagia: Secondary | ICD-10-CM

## 2020-03-03 DIAGNOSIS — K222 Esophageal obstruction: Secondary | ICD-10-CM

## 2020-03-22 ENCOUNTER — Other Ambulatory Visit: Payer: Self-pay | Admitting: Cardiovascular Disease

## 2020-04-09 ENCOUNTER — Ambulatory Visit (INDEPENDENT_AMBULATORY_CARE_PROVIDER_SITE_OTHER): Payer: Medicare Other | Admitting: *Deleted

## 2020-04-09 ENCOUNTER — Other Ambulatory Visit: Payer: Self-pay

## 2020-04-09 DIAGNOSIS — Z5181 Encounter for therapeutic drug level monitoring: Secondary | ICD-10-CM | POA: Diagnosis not present

## 2020-04-09 DIAGNOSIS — I4819 Other persistent atrial fibrillation: Secondary | ICD-10-CM

## 2020-04-09 LAB — POCT INR: INR: 2.6 (ref 2.0–3.0)

## 2020-04-09 NOTE — Patient Instructions (Signed)
Description   Continue taking 1 tablet daily except 1.5 tablets each Sundays and Thursdays. Recheck INR 6 weeks. Call Coumadin clinic for any changes in medications or up coming procedures. [phone].

## 2020-05-13 ENCOUNTER — Telehealth: Payer: Self-pay | Admitting: Family Medicine

## 2020-05-13 DIAGNOSIS — Z9989 Dependence on other enabling machines and devices: Secondary | ICD-10-CM

## 2020-05-13 DIAGNOSIS — G4733 Obstructive sleep apnea (adult) (pediatric): Secondary | ICD-10-CM

## 2020-05-13 NOTE — Telephone Encounter (Signed)
daugther called in wanted to know about getting a new cpap machine and wanted to change it to [facility]. His machine is messing up and needs new one

## 2020-05-14 NOTE — Telephone Encounter (Signed)
Patients daughter is calling back about this? Any updates? Please advise patient. EM

## 2020-05-14 NOTE — Telephone Encounter (Signed)
I printed his sleep study.  Please notify [employer] and see what they are going to require.  Thanks.

## 2020-05-14 NOTE — Telephone Encounter (Signed)
Patient is going to need an updated sleep study done since his last one was done in 2010; needs to be done at least every 10 years. I have updated patients daughter about this as well.

## 2020-05-15 NOTE — Telephone Encounter (Signed)
I put in a referral to pulmonary.  Thanks.

## 2020-05-17 ENCOUNTER — Telehealth: Payer: Self-pay | Admitting: Family Medicine

## 2020-05-17 DIAGNOSIS — Z9989 Dependence on other enabling machines and devices: Secondary | ICD-10-CM

## 2020-05-17 DIAGNOSIS — G4733 Obstructive sleep apnea (adult) (pediatric): Secondary | ICD-10-CM

## 2020-05-17 NOTE — Telephone Encounter (Signed)
He was given a referral for pulmonologist but he has a sleep doctor already and its at [facility] and the referral needs to go to that location

## 2020-05-17 NOTE — Telephone Encounter (Signed)
I put in a new referral to neuro.  Please cancel the pulmonary referral.  Thanks.

## 2020-05-21 ENCOUNTER — Other Ambulatory Visit: Payer: Self-pay

## 2020-05-21 ENCOUNTER — Ambulatory Visit (INDEPENDENT_AMBULATORY_CARE_PROVIDER_SITE_OTHER): Payer: Medicare Other

## 2020-05-21 DIAGNOSIS — I4819 Other persistent atrial fibrillation: Secondary | ICD-10-CM | POA: Diagnosis not present

## 2020-05-21 DIAGNOSIS — Z5181 Encounter for therapeutic drug level monitoring: Secondary | ICD-10-CM | POA: Diagnosis not present

## 2020-05-21 LAB — POCT INR: INR: 1.8 — AB (ref 2.0–3.0)

## 2020-05-21 NOTE — Patient Instructions (Signed)
-   when you get home, take a 1/2 a tablet warfarin, then  - Continue taking 1 tablet daily except 1.5 tablets each Sundays and Thursdays.  - Recheck INR 6 weeks.  Call Coumadin clinic for any changes in medications or up coming procedures. [phone].

## 2020-05-22 NOTE — Telephone Encounter (Signed)
Noted. Referral closed.

## 2020-05-23 ENCOUNTER — Telehealth: Payer: Self-pay

## 2020-05-23 DIAGNOSIS — I350 Nonrheumatic aortic (valve) stenosis: Secondary | ICD-10-CM

## 2020-05-23 NOTE — Telephone Encounter (Signed)
Called to schedule 1 year echo and office visit due June 2022. Scheduled the patient 08/15/20 for echo and subsequent visit with Dr. Burt Knack. He was grateful for call and agrees with plan.

## 2020-05-24 DIAGNOSIS — C44629 Squamous cell carcinoma of skin of left upper limb, including shoulder: Secondary | ICD-10-CM | POA: Diagnosis not present

## 2020-05-24 DIAGNOSIS — L821 Other seborrheic keratosis: Secondary | ICD-10-CM | POA: Diagnosis not present

## 2020-05-24 DIAGNOSIS — Z85828 Personal history of other malignant neoplasm of skin: Secondary | ICD-10-CM | POA: Diagnosis not present

## 2020-05-24 DIAGNOSIS — L300 Nummular dermatitis: Secondary | ICD-10-CM | POA: Diagnosis not present

## 2020-05-24 DIAGNOSIS — L570 Actinic keratosis: Secondary | ICD-10-CM | POA: Diagnosis not present

## 2020-05-24 DIAGNOSIS — C44619 Basal cell carcinoma of skin of left upper limb, including shoulder: Secondary | ICD-10-CM | POA: Diagnosis not present

## 2020-05-24 DIAGNOSIS — L814 Other melanin hyperpigmentation: Secondary | ICD-10-CM | POA: Diagnosis not present

## 2020-05-28 ENCOUNTER — Telehealth: Payer: Self-pay | Admitting: Family Medicine

## 2020-05-28 NOTE — Progress Notes (Signed)
  Chronic Care Management   Outreach Note  05/28/2020 Name: KEVEON AMSLER MRN: [id number] DOB: [birth year]  Referred by: Tonia Ghent, MD Reason for referral : No chief complaint on file.   An unsuccessful telephone outreach was attempted today. The patient was referred to the pharmacist for assistance with care management and care coordination.   Follow Up Plan:   Lauretta Grill Upstream Scheduler

## 2020-06-01 DIAGNOSIS — J0110 Acute frontal sinusitis, unspecified: Secondary | ICD-10-CM | POA: Diagnosis not present

## 2020-06-12 ENCOUNTER — Telehealth: Payer: Self-pay | Admitting: Family Medicine

## 2020-06-12 ENCOUNTER — Ambulatory Visit (INDEPENDENT_AMBULATORY_CARE_PROVIDER_SITE_OTHER): Payer: Medicare Other

## 2020-06-12 ENCOUNTER — Other Ambulatory Visit: Payer: Self-pay

## 2020-06-12 DIAGNOSIS — E538 Deficiency of other specified B group vitamins: Secondary | ICD-10-CM

## 2020-06-12 MED ORDER — CYANOCOBALAMIN 1000 MCG/ML IJ SOLN
1000.0000 ug | Freq: Once | INTRAMUSCULAR | Status: AC
  Administered 2020-06-12: 1000 ug via INTRAMUSCULAR

## 2020-06-12 NOTE — Chronic Care Management (AMB) (Signed)
  Chronic Care Management   Note  06/12/2020 Name: [name] MRN: [id number] DOB: September 23, 1930  [name] is a 87 y.o. year old male who is a primary care patient of Tonia Ghent, MD. I reached out to [name] by phone today in response to a referral sent by [name] PCP, Tonia Ghent, MD.   [name] was given information about Chronic Care Management services today including:  1. CCM service includes personalized support from designated clinical staff supervised by his physician, including individualized plan of care and coordination with other care providers 2. 24/7 contact phone numbers for assistance for urgent and routine care needs. 3. Service will only be billed when office clinical staff spend 20 minutes or more in a month to coordinate care. 4. Only one practitioner may furnish and bill the service in a calendar month. 5. The patient may stop CCM services at any time (effective at the end of the month) by phone call to the office staff.   Patient agreed to services and verbal consent obtained.   Follow up plan:   Lauretta Grill Upstream Scheduler

## 2020-06-12 NOTE — Progress Notes (Signed)
Per orders of Dr. Danise Mina in the absence of Dr. Damita Dunnings, injection of B12, given by Aneta Mins, RN. Patient tolerated injection well in L Deltoid.

## 2020-06-16 ENCOUNTER — Other Ambulatory Visit: Payer: Self-pay | Admitting: Cardiovascular Disease

## 2020-06-20 ENCOUNTER — Ambulatory Visit (INDEPENDENT_AMBULATORY_CARE_PROVIDER_SITE_OTHER): Payer: Medicare Other | Admitting: Neurology

## 2020-06-20 ENCOUNTER — Encounter: Payer: Self-pay | Admitting: Neurology

## 2020-06-20 VITALS — BP 127/78 | HR 87 | Ht 66.0 in | Wt 215.0 lb

## 2020-06-20 DIAGNOSIS — M109 Gout, unspecified: Secondary | ICD-10-CM

## 2020-06-20 DIAGNOSIS — I4819 Other persistent atrial fibrillation: Secondary | ICD-10-CM

## 2020-06-20 DIAGNOSIS — G4731 Primary central sleep apnea: Secondary | ICD-10-CM | POA: Diagnosis not present

## 2020-06-20 DIAGNOSIS — E1142 Type 2 diabetes mellitus with diabetic polyneuropathy: Secondary | ICD-10-CM | POA: Diagnosis not present

## 2020-06-20 DIAGNOSIS — Z9989 Dependence on other enabling machines and devices: Secondary | ICD-10-CM

## 2020-06-20 NOTE — Progress Notes (Addendum)
SLEEP MEDICINE CLINIC    Provider:  Larey Seat, MD  Primary Care Physician:  Tonia Ghent, MD [address] [place] [address]     Referring Provider: Tonia Ghent, Md [place],  [place] [address]     Cc Dr Jannifer Franklin, MD        Chief Complaint according to patient   Patient presents with:    . New Patient (Initial Visit)     Patient has seen me ? For vertigo 10 years-ago , now sees Dr. Jannifer Franklin. Has a wish to reestablished  sleep care here.  Presents today to get established for his CPAP machine and get a new one. States been about 10+ yrs since last SS. He is currently on his 2nd machine. [facility] ([facility] set up 2017. Machine stating, motor life near end       HISTORY OF PRESENT ILLNESS:  [name] is a 87 y.o. White or Caucasian male patient is seen here for s sleep consultation , pre PCP, on 06/20/2020 .   Chief concern according to patient :  The patient has seen me for Vertigo- over a decade ago.  The patient has not undergone a sleep testing in over a decade, his current machine is actually a replacement machine as I understand he was not retested before being given a BiPAP machine his original sleep test may have been over 15 years ago, on [place] [facility].  He was diagnosed with obstructive sleep apnea but my epic records do not reach back. He was a client of [facility].   Apparently he must have been on BiPAP before because when this current machine which is now over 62 years old was given to him he was not asked to retest and he was not given a different setting from what he used before. Now the machine displays a " end of lifetime " message.     [name]  has a past medical history of Bradycardia, Cancer ([place]), CKD (chronic kidney disease), stage III ([place]), Coronary artery disease, Diabetes mellitus without complication ([place]), Diabetic peripheral neuropathy ([place]) (10/04/2019),  Diverticulosis, Esophageal reflux, Esophageal stricture from GERD (06/08/2011), Gastric ulcer, Gout, unspecified, History of blood transfusion (02/1964), Hyperlipidemia, Hypertension, Obesity, unspecified, Orthopnea, OSA on CPAP, Paroxysmal nocturnal dyspnea, Persistent atrial fibrillation ([place]), and Vertigo.   The patient had the first sleep study in the year 2006 - I have no access to his original data, and there has been no  Baseline since (?) Sleep relevant medical history: Nocturia once , no dream enactment. DDD, cervical spine .    Family medical /sleep history:No  other family member on CPAP with OSA, insomnia, sleep walkers.    Social history:  Patient is retired from trucking for 42 years and lives in a household alone. Widowed since 2015, after 42 years of marriage. Has had 4 children, a 77 year old son dies of a brain tumor.  Tobacco use quit over 50 years ago.   ETOH use - quit 1970, Caffeine intake in form of Coffee( 2 cups in AM ). Regular exercise- walking .    Sleep habits are as follows: The patient's dinner time is between 5.30 PM. The patient goes to bed at 10 PM and continues to sleep for sleeps many hours, wakes for one bathroom break.   The preferred sleep position is variable , with the support of 2 pillows.  Dreams  are reportedly rare.  8  AM is the usual rise time. The patient wakes up spontaneously. He reports usually feeling refreshed or restored in AM,  Naps are not taken .   10-04-2019 Dr Jannifer Franklin. [name] is an 87 year old right-handed white male with a history of diabetes that is diet-controlled, obesity, and a known vitamin B12 deficiency on B12 shots.  The patient has noted a 1 year history of numbness in the hands that is present at all times, but the symptoms may worsen at times.  The patient finds it is difficult to use his hands for fine motor control activities such as buttoning buttons.  He denies any neck pain or pain down the arms.  He denies any weakness in  the arms.  He denies any numbness in the feet but he does note some gait instability.  He may feel unstable when he is standing, if he reaches over and touches something that problem goes away immediately.  He denies issues controlling the bowels or the bladder.  He has no back pain or pain down the legs.  He denies any weakness in the legs.  He does have significant issues with gout affecting the feet and the left knee.  He is sent to this office for further evaluation.    Review of Systems: Out of a complete 14 system review, the patient complains of only the following symptoms, and all other reviewed systems are negative.:   neuropathy gout and DM 2., diet controlled.    On BIPAP for over 12 years. Likely 15 years .  How likely are you to doze in the following situations: 0 = not likely, 1 = slight chance, 2 = moderate chance, 3 = high chance   Sitting and Reading? Watching Television? Sitting inactive in a public place (theater or meeting)? As a passenger in a car for an hour without a break? Lying down in the afternoon when circumstances permit? Sitting and talking to someone? Sitting quietly after lunch without alcohol? In a car, while stopped for a few minutes in traffic?   Total = 2/ 24 points   FSS endorsed at 11/ 63 points.   GDS 2 points.   Social History   Socioeconomic History  . Marital status: Widowed    Spouse name: Not on file  . Number of children: 3  . Years of education: Not on file  . Highest education level: Not on file  Occupational History  . 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 Smoking status: Former Smoker    Packs/day: 0.12    Years: 4.00    Pack years: 0.48    Types: Cigarettes    Quit date: 03/16/1969    Years since quitting: 51.2  . Smokeless tobacco: Former Systems developer  . Tobacco comment: "chewed when I smoked; smoked 1 pack/week for about 4 years, quit 1971  Vaping Use  . Vaping Use: Never used   Substance and Sexual Activity  . Alcohol use: Not Currently    Alcohol/week: 0.0 standard drinks    Comment: 09/09/2017  "Quit all alcohol in 1970's"  . Drug use: Never  . Sexual activity: Not on file  Other Topics Concern  . Not on file  Social History Narrative   Lives in [place], married 1953, widowed after 63 years   Retired Administrator, still does Biomedical scientist.   He is not routinely exercising.   Daily caffeine    Social Determinants of  Health   Financial Resource Strain: Not on file  Food Insecurity: Not on file  Transportation Needs: Not on file  Physical Activity: Not on file  Stress: Not on file  Social Connections: Not on file    Family History  Problem Relation Age of Onset  . Stomach cancer Mother        died in 28's   . Hypertension Father        died in his 59's pna likely dementia  . Dementia Father   . Pneumonia Father   . Heart disease Brother        S/P CABG  . Hypertension Other   . Prostate cancer Neg Hx   . Colon cancer Neg Hx     Past Medical History:  Diagnosis Date  . Bradycardia    a. nocturnal with pauses overnight on tele likely due to OSA 10/2015.  [name] Cancer ([facility])    skin cancer  . CKD (chronic kidney disease), stage III ([facility])   . Coronary artery disease    a. prev nonobst. b. LHC 10/24/15: occlusion of small diffusely diseased OM2 s/p balloon angioplasty, mild nonobstructive disease of mLAD and mRCA, normal LVEDP  . Diabetes mellitus without complication ([facility])   . Diabetic peripheral neuropathy ([facility]) 10/04/2019  . Diverticulosis   . Esophageal reflux   . Esophageal stricture from GERD 06/08/2011   With stricture at GE junction on EGD, dilated 06/2011   . Gastric ulcer    on EGD 2013  . Gout, unspecified    "on daily RX" (09/09/2017)  . History of blood transfusion 02/1964   "when I had right little finger cut off"  . Hyperlipidemia   . Hypertension   . Obesity, unspecified   . Orthopnea   . OSA on CPAP   . Paroxysmal nocturnal  dyspnea   . Persistent atrial fibrillation ([facility])   . Vertigo     Past Surgical History:  Procedure Laterality Date  . AMPUTATION Right 03/03/2019   Procedure: RIGHT 2ND TOE AMPUTATION;  Surgeon: [name], MD;  Location: [facility];  Service: Orthopedics;  Laterality: Right;  . BALLOON DILATION N/A 10/22/2014   Procedure: BALLOON DILATION;  Surgeon: [name], MD;  Location: [facility];  Service: Endoscopy;  Laterality: N/A;  . CARDIAC CATHETERIZATION  07/2008  . CARDIAC CATHETERIZATION N/A 10/24/2015   Procedure: Left Heart Cath and Coronary Angiography;  Surgeon: [name], MD;  Location: [facility];  Service: Cardiovascular;  Laterality: N/A;  . CARDIAC CATHETERIZATION N/A 10/24/2015   Procedure: Coronary Balloon Angioplasty;  Surgeon: [name], MD;  Location: [facility];  Service: Cardiovascular;  Laterality: N/A;  . Carotid Dopplers  09/2009   no sig extracranial stenosis and vertebral arteries had antegrade flow  . CATARACT EXTRACTION W/ INTRAOCULAR LENS  IMPLANT, BILATERAL Bilateral   . ESOPHAGOGASTRODUODENOSCOPY (EGD) WITH ESOPHAGEAL DILATION     "I've had it stretched 3 times" (10/23/2015)  . ESOPHAGOGASTRODUODENOSCOPY (EGD) WITH ESOPHAGEAL DILATION  06/2011   /medical hx above  (09/09/2017)  . ESOPHAGOGASTRODUODENOSCOPY (EGD) WITH PROPOFOL N/A 10/22/2014   Procedure: ESOPHAGOGASTRODUODENOSCOPY (EGD) WITH PROPOFOL;  Surgeon: [name], MD;  [facility];  Service: Endoscopy;  Laterality: N/A;  . FINGER SURGERY Right 02/1964   "cut little finger off; had it reattached"  . I & D EXTREMITY Right 09/10/2017   Procedure: RIGHT FOOT DEBRIDEMENT;  Surgeon: [name], MD;  Location: [facility];  Service: Orthopedics;  Laterality: Right;  .  MRI of brain  09/2009   chronic microvascular ischemia  . OSA Sleep Study     per Dr. Brett Fairy  . SHOULDER ARTHROSCOPY W/ ROTATOR CUFF REPAIR Right 2008     Current Outpatient Medications on File Prior to  Visit  Medication Sig Dispense Refill  . acetaminophen (TYLENOL) 500 MG tablet Take 1,000 mg by mouth as needed for mild pain.    [name] allopurinol (ZYLOPRIM) 100 MG tablet TAKE 1 TABLET BY MOUTH EVERY DAY 90 tablet 3  . Colchicine (MITIGARE) 0.6 MG CAPS Take 1 tablet by mouth 2 (two) times daily as needed (Mitigare). 30 capsule 1  . diltiazem (CARDIZEM CD) 180 MG 24 hr capsule Take 1 capsule (180 mg total) by mouth daily. 90 capsule 2  . furosemide (LASIX) 20 MG tablet TAKE 1 TABLET BY MOUTH DAILY. PLEASE MAKE YEARLY APPT WITH DR. Burt Knack FOR MAY BEFORE ANYMORE REFILLS 90 tablet 3  . Insulin Syringe-Needle U-100 (B-D INSULIN SYRINGE 1CC/25GX1") 25G X 1" 1 ML MISC Use with B12 shots 10 each 1  . lisinopril (ZESTRIL) 20 MG tablet Take 1 tablet (20 mg total) by mouth daily. 90 tablet 2  . metoprolol tartrate (LOPRESSOR) 25 MG tablet Take 1 tablet (25 mg total) by mouth 2 (two) times daily. 180 tablet 3  . warfarin (COUMADIN) 4 MG tablet TAKE AS DIRECTED BY COUMADIN CLINIC 120 tablet 0  . EPINEPHrine 0.3 mg/0.3 mL IJ SOAJ injection      No current facility-administered medications on file prior to visit.    Allergies  Allergen Reactions  . Penicillins Shortness Of Breath and Swelling    Patient tolerated Rocephin injection 2019  PATIENT HAS HAD A PCN REACTION WITH IMMEDIATE RASH, FACIAL/TONGUE/THROAT SWELLING, SOB, OR LIGHTHEADEDNESS WITH HYPOTENSION:  #  #  YES  #  Has patient had a PCN reaction causing severe rash involving mucus membranes or skin necrosis: NO Has patient had a PCN reaction that required hospitalization NO Has patient had a PCN reaction occurring within the last 10 years: NO If all of the above answers are "NO", then may proceed with Cephalosporin use.   . Atorvastatin Other (See Comments)    No energy, just felt bad    . Pravastatin Other (See Comments)    myalgias  . Hydrocodone Other (See Comments)    "swimmy headed" after use  . Tomato     Presumed allergy  . Uloric  [Febuxostat] Other (See Comments)    Would avoid, worsening gout sx with use.   . Vancomycin Other (See Comments)    Red man syndrome- tolerated with slow run and benadryl use  . Biaxin [Clarithromycin] Diarrhea  . Doxycycline Other (See Comments)    GI upset    Physical exam:  Today's Vitals   06/20/20 1247  BP: 127/78  Pulse: 87  Weight: 215 lb (97.5 kg)  Height: 5\' 6"  (1.676 m)   Body mass index is 34.7 kg/m.   Wt Readings from Last 3 Encounters:  06/20/20 215 lb (97.5 kg)  11/23/19 217 lb 9 oz (98.7 kg)  09/11/19 218 lb 3 oz (99 kg)     Ht Readings from Last 3 Encounters:  06/20/20 5\' 6"  (1.676 m)  11/23/19 5\' 6"  (1.676 m)  09/11/19 5\' 6"  (1.676 m)      General: The patient is awake, alert and appears not in acute distress. T he patient is well groomed. Head: Normocephalic, atraumatic. Neck is supple. Mallampati  1 but the opening is a very  small triangle-  neck circumference:19.25  inches .  Small oral opening.  Nasal airflow not fully patent.  Retrognathia is not seen.  Dental status: biological  Cardiovascular:  Regular rate and cardiac rhythm by pulse,  without distended neck veins. Respiratory: Lungs are clear to auscultation.  Skin:  With  evidence of mild ankle edema. Chronically anticoagulated on coumadin.  Petechiae. Gout tophi in both feet.  Second toe amputated.  Trunk: The patient's posture is erect.   Neurologic exam : The patient is awake and alert, oriented to place and time.   Memory subjective described as intact.  Attention span & concentration ability appears normal.  Speech is fluent,  without  dysarthria, dysphonia or aphasia.  Mood and affect are appropriate.   Cranial nerves: no loss of smell or taste reported-   Pupils are equal and briskly reactive to light. Funduscopic exam deferred.  Extraocular movements in vertical and horizontal planes were intact and without nystagmus. No Diplopia. Visual fields by finger perimetry are  intact. Hearing was intact to soft voice and finger rubbing.    Facial sensation intact to fine touch.  Facial motor strength is symmetric and tongue and uvula move midline.  Neck ROM : rotation, tilt and flexion extension were normal for age and shoulder shrug was symmetrical.    Motor exam:  Symmetric bulk, tone and ROM.   Normal tone without cog -wheeling, symmetric grip strength .   Sensory:  Fine touch, pinprick and vibration were tested  and  normal.  Proprioception tested in the upper extremities was normal.   Coordination: Rapid alternating movements in the fingers/hands were of normal speed.  The Finger-to-nose maneuver was intact without evidence of ataxia or tremor.   Gait and station: Patient could rise unassisted from a seated position, walked without assistive device.  Stance is of normal width/ base and the patient turned with 4 steps.  Toe and heel walk were deferred.  Deep tendon reflexes: in the upper and lower extremities are symmetric and intact. 1/3  Babinski response was deferred .     [name] is a very compliant CPAP user or BiPAP user in his case out of the last 30 days he has taken the machine every night and always over 4 hours with an average of 10 hours.  He has been inspiratory pressure setting of 14 and a expiratory pressure setting of 9 cmH2O this is a 5 cm Wilfred Lacy he has air leaks at the 95th percentile of 0.4 which speaks for a very good mask fit, and the residual events per hour are 8.2 unfortunately the machine does not state if these are central or obstructive apneas and it does not breakdown the time of when these may occur most frequently.  So I need to establish a baseline, I will order a split-night polysomnography and advised the technologist to give the patient CPAP in case he cannot sleep without, I consider him BiPAP dependent.  If CPAP promotes central apneas switch to BiPAP and explore higher pressures than currently to see if this is a  central apnea or obstructive sleep apnea or a mix of both, also known as complex sleep apnea.  Thank you     After spending a total time of 45 minutes face to face and additional time for physical and neurologic examination, review of laboratory studies,  Review of Dr Jannifer Franklin notes.  personal review of imaging studies, reports and results of other testing and review of referral information / records  as far as provided in visit, I have established the following assessments:  1)  On BIPAP for ? OSA/ CSA? He has persistent atrial fibrillation and is chronically anticoagulated. Dr Burt Knack.  2)  DM/ Gout Neuropathy per dr Jannifer Franklin.  3)  Arthritis and DJD , dr Para March.    My Plan is to proceed with:  1) baseline testing , this patient has been on the same setting of BipAP for a decade or longer without having  re-established the  need or status of apnea type and degree.  He retired 20 years ago, and he started PAP therapy ( he thinks 18 years ago ) this is his second machine.   2)  I will ask him to come in for SPLIT and titration .    I would like to thank Tonia Ghent, MD and Tonia Ghent, [place],  [place] [address] for allowing me to meet with and to take care of this pleasant patient.   In short, [name] is presenting with PAP/ BiPAP dependency and machine failure.  I plan to follow up either personally or through our NP within 1-4 month.   CC: I will share my notes with PCP and Dr Jannifer Franklin.  Electronically signed by: Larey Seat, MD 06/20/2020 1:09 PM  [facility] Board certified by The [employer] of Sleep Medicine and Diplomate of the [employer] of Sleep Medicine. Board certified In Neurology through the [place], Fellow of the [employer] of Neurology. Medical Director of [facility].

## 2020-06-20 NOTE — Patient Instructions (Signed)

## 2020-06-27 DIAGNOSIS — M170 Bilateral primary osteoarthritis of knee: Secondary | ICD-10-CM | POA: Diagnosis not present

## 2020-07-03 ENCOUNTER — Ambulatory Visit (INDEPENDENT_AMBULATORY_CARE_PROVIDER_SITE_OTHER): Payer: Medicare Other | Admitting: *Deleted

## 2020-07-03 ENCOUNTER — Other Ambulatory Visit: Payer: Self-pay

## 2020-07-03 DIAGNOSIS — I4819 Other persistent atrial fibrillation: Secondary | ICD-10-CM | POA: Diagnosis not present

## 2020-07-03 DIAGNOSIS — Z5181 Encounter for therapeutic drug level monitoring: Secondary | ICD-10-CM

## 2020-07-03 LAB — POCT INR: INR: 2.1 (ref 2.0–3.0)

## 2020-07-03 NOTE — Patient Instructions (Addendum)
Description   Continue taking 1 tablet daily except 1.5 tablets on Sundays and Thursdays. Recheck INR 6 weeks with MD Appt. Call Coumadin clinic for any changes in medications or up coming procedures. [phone].

## 2020-07-08 ENCOUNTER — Ambulatory Visit: Payer: Medicare Other | Admitting: Family Medicine

## 2020-07-16 ENCOUNTER — Other Ambulatory Visit: Payer: Self-pay

## 2020-07-16 ENCOUNTER — Ambulatory Visit (INDEPENDENT_AMBULATORY_CARE_PROVIDER_SITE_OTHER): Payer: Medicare Other

## 2020-07-16 DIAGNOSIS — E538 Deficiency of other specified B group vitamins: Secondary | ICD-10-CM

## 2020-07-16 MED ORDER — CYANOCOBALAMIN 1000 MCG/ML IJ SOLN
1000.0000 ug | Freq: Once | INTRAMUSCULAR | Status: AC
  Administered 2020-07-16: 1000 ug via INTRAMUSCULAR

## 2020-07-16 NOTE — Progress Notes (Signed)
Per orders of Dr. Damita Dunnings, injection of B12  Given in R deltoid by Randall An. Patient tolerated injection well.

## 2020-07-17 ENCOUNTER — Telehealth: Payer: Self-pay

## 2020-07-17 NOTE — Chronic Care Management (AMB) (Addendum)
Chronic Care Management Pharmacy Assistant   Name: [name]  MRN: [id number] DOB: 1931-01-26  [name] is an 87 y.o. year old male who presents for his initial CCM visit with the clinical pharmacist.  Reason for Encounter: Initial Questions and Chart Review for CCM Appointment on 07/25/20   Conditions to be addressed/monitored: Atrial Fibrillation, CHF, CAD, HTN, DMII, CKD Stage III and Hyperlipidemia  Recent office visits:  05/13/20 & 05/17/20- Telephone Encounter- Dr. Damita Dunnings. Patient requested pulmonology referral be cancelled and requested Neurology referral instead.   Recent consult visits:  06/20/20- Dr. Asencion Partridge Dohmeier- Neurology- ordered Split night study. No changes. [facility] Sinus[facility]. No Additional data available.  Hospital visits:  None in previous 6 months  Medications: Outpatient Encounter Medications as of 07/17/2020  Medication Sig Note   acetaminophen (TYLENOL) 500 MG tablet Take 1,000 mg by mouth as needed for mild pain.    allopurinol (ZYLOPRIM) 100 MG tablet TAKE 1 TABLET BY MOUTH EVERY DAY 12/13/2019: Takes 1 every night   Colchicine (MITIGARE) 0.6 MG CAPS Take 1 tablet by mouth 2 (two) times daily as needed (Mitigare). 12/13/2019: Takes 1 every morning   diltiazem (CARDIZEM CD) 180 MG 24 hr capsule Take 1 capsule (180 mg total) by mouth daily.    EPINEPHrine 0.3 mg/0.3 mL IJ SOAJ injection     furosemide (LASIX) 20 MG tablet TAKE 1 TABLET BY MOUTH DAILY. PLEASE MAKE YEARLY APPT WITH DR. Burt Knack FOR MAY BEFORE ANYMORE REFILLS    Insulin Syringe-Needle U-100 (B-D INSULIN SYRINGE 1CC/25GX1") 25G X 1" 1 ML MISC Use with B12 shots    lisinopril (ZESTRIL) 20 MG tablet Take 1 tablet (20 mg total) by mouth daily.    metoprolol tartrate (LOPRESSOR) 25 MG tablet Take 1 tablet (25 mg total) by mouth 2 (two) times daily.    warfarin (COUMADIN) 4 MG tablet TAKE AS DIRECTED BY COUMADIN CLINIC    No facility-administered  encounter medications on file as of 07/17/2020.    Lab Results  Component Value Date/Time   HGBA1C 7.7 (H) 09/11/2019 02:42 PM   HGBA1C 6.8 (H) 05/16/2019 09:23 AM     BP Readings from Last 3 Encounters:  06/20/20 127/78  11/23/19 116/72  09/11/19 122/62      Have you seen any other providers since your last visit with PCP? Yes   06/20/20- Dr. Asencion Partridge Dohmeier- Neurology- ordered Split night study. No changes.  [facility] Sinusitis. No Additional data  available.   Any changes in your medications or health? No  Any side effects from any medications? No  Do you have an symptoms or problems not managed by your medications? No  Any concerns about your health right now? Yes- right buttock pain x 2 weeks. Notes he mowed his 1 acre yard on the riding lawn mower and pain started after that.   Has your provider asked that you check blood pressure, blood sugar, or follow special diet at home? states he has never been told to check either at home but does check his blood pressure at home.  Do you get any type of exercise on a regular basis? No- states since he lost his toe he has not been as active. Had to have it amputated due to gout and infection.   Can you think of a goal you would like to reach for your health? Yes wants to be more active.   Do you  have any problems getting your medications? No  Is there anything that you would like to discuss during the appointment? Yes   [name] was reminded to have all medications, supplements and any blood glucose and blood pressure readings available for review with Debbora Dus, Pharm. D, at his telephone visit on 07/25/20 at 11:00 .    Star Rating Drugs:  Medication:  Last Fill: Day Supply Lisinopril 20 mg 04/24/20  90   Follow-Up:  Pharmacist Review  Debbora Dus, CPP notified  Margaretmary Dys, [place] Assistant [phone]   I have reviewed the care management  and care coordination activities outlined in this encounter and I am certifying that I agree with the content of this note. No further action required.  Debbora Dus, PharmD Clinical Pharmacist [facility] [phone]

## 2020-07-18 ENCOUNTER — Other Ambulatory Visit: Payer: Self-pay | Admitting: Cardiovascular Disease

## 2020-07-18 DIAGNOSIS — I4819 Other persistent atrial fibrillation: Secondary | ICD-10-CM

## 2020-07-18 DIAGNOSIS — R42 Dizziness and giddiness: Secondary | ICD-10-CM

## 2020-07-18 DIAGNOSIS — I2510 Atherosclerotic heart disease of native coronary artery without angina pectoris: Secondary | ICD-10-CM

## 2020-07-23 ENCOUNTER — Other Ambulatory Visit (INDEPENDENT_AMBULATORY_CARE_PROVIDER_SITE_OTHER): Payer: Medicare Other

## 2020-07-23 ENCOUNTER — Encounter: Payer: Self-pay | Admitting: Family Medicine

## 2020-07-23 ENCOUNTER — Ambulatory Visit (INDEPENDENT_AMBULATORY_CARE_PROVIDER_SITE_OTHER): Payer: Medicare Other | Admitting: Family Medicine

## 2020-07-23 ENCOUNTER — Other Ambulatory Visit: Payer: Self-pay

## 2020-07-23 DIAGNOSIS — M545 Low back pain, unspecified: Secondary | ICD-10-CM | POA: Diagnosis not present

## 2020-07-23 DIAGNOSIS — E119 Type 2 diabetes mellitus without complications: Secondary | ICD-10-CM | POA: Diagnosis not present

## 2020-07-23 DIAGNOSIS — M109 Gout, unspecified: Secondary | ICD-10-CM

## 2020-07-23 DIAGNOSIS — E538 Deficiency of other specified B group vitamins: Secondary | ICD-10-CM | POA: Diagnosis not present

## 2020-07-23 LAB — CBC WITH DIFFERENTIAL/PLATELET
Basophils Absolute: 0 10*3/uL (ref 0.0–0.1)
Basophils Relative: 0.4 % (ref 0.0–3.0)
Eosinophils Absolute: 0.1 10*3/uL (ref 0.0–0.7)
Eosinophils Relative: 1.7 % (ref 0.0–5.0)
HCT: 49.3 % (ref 39.0–52.0)
Hemoglobin: 16.2 g/dL (ref 13.0–17.0)
Lymphocytes Relative: 21 % (ref 12.0–46.0)
Lymphs Abs: 1.7 10*3/uL (ref 0.7–4.0)
MCHC: 32.9 g/dL (ref 30.0–36.0)
MCV: 88.5 fl (ref 78.0–100.0)
Monocytes Absolute: 0.8 10*3/uL (ref 0.1–1.0)
Monocytes Relative: 9.8 % (ref 3.0–12.0)
Neutro Abs: 5.4 10*3/uL (ref 1.4–7.7)
Neutrophils Relative %: 67.1 % (ref 43.0–77.0)
Platelets: 101 10*3/uL — ABNORMAL LOW (ref 150.0–400.0)
RBC: 5.57 Mil/uL (ref 4.22–5.81)
RDW: 15.3 % (ref 11.5–15.5)
WBC: 8.1 10*3/uL (ref 4.0–10.5)

## 2020-07-23 LAB — COMPREHENSIVE METABOLIC PANEL
ALT: 24 U/L (ref 0–53)
AST: 21 U/L (ref 0–37)
Albumin: 4.2 g/dL (ref 3.5–5.2)
Alkaline Phosphatase: 95 U/L (ref 39–117)
BUN: 53 mg/dL — ABNORMAL HIGH (ref 6–23)
CO2: 28 mEq/L (ref 19–32)
Calcium: 9.2 mg/dL (ref 8.4–10.5)
Chloride: 101 mEq/L (ref 96–112)
Creatinine, Ser: 2.2 mg/dL — ABNORMAL HIGH (ref 0.40–1.50)
GFR: 26.26 mL/min — ABNORMAL LOW (ref >60.00)
Glucose, Bld: 121 mg/dL — ABNORMAL HIGH (ref 70–99)
Potassium: 4.9 mEq/L (ref 3.5–5.1)
Sodium: 137 mEq/L (ref 135–145)
Total Bilirubin: 0.6 mg/dL (ref 0.2–1.2)
Total Protein: 7.4 g/dL (ref 6.0–8.3)

## 2020-07-23 LAB — VITAMIN B12: Vitamin B-12: 604 pg/mL (ref 211–911)

## 2020-07-23 LAB — URIC ACID: Uric Acid, Serum: 7.5 mg/dL (ref 4.0–7.8)

## 2020-07-23 LAB — HEMOGLOBIN A1C: Hgb A1c MFr Bld: 7.2 % — ABNORMAL HIGH (ref 4.6–6.5)

## 2020-07-23 MED ORDER — BACLOFEN 10 MG PO TABS: 5.0000 mg | ORAL_TABLET | Freq: Every evening | ORAL | 3 refills | Status: DC | PRN

## 2020-07-23 MED ORDER — MELOXICAM 15 MG PO TABS: 7.5000 mg | ORAL_TABLET | Freq: Every day | ORAL | 6 refills | Status: DC | PRN

## 2020-07-23 NOTE — Progress Notes (Signed)
Office Visit Note   Patient: [name]           Date of Birth: 1930/06/21           MRN: [id number] Visit Date: 07/23/2020 Requested by: Tonia Ghent, MD [place],  [place] [id number] PCP: Tonia Ghent, MD  Subjective: Chief Complaint  Patient presents with  . Lower Back - Pain    Pain started in the right side of the back 1 month ago and now across the lower back. NKI. Pain into the right flank also. Dark urine in the mornings, with an odor. Patient wants to know if the pain is due to his hip, back or kidneys.    HPI: He is here with right posterior hip/low back pain.  Symptoms started about a month and a half ago, no injury.  He has had intermittent pain in this area for a while now.  Lately he has been having flank pain and lower abdominal pain, with dark urine that has a foul odor.  No fevers or chills.  No history of kidney stones.  Denies any blood in his urine.               ROS:   All other systems were reviewed and are negative.  Objective: Vital Signs: There were no vitals taken for this visit.  Physical Exam:  General:  Alert and oriented, in no acute distress. Pulm:  Breathing unlabored. Psy:  Normal mood, congruent affect. Skin: No rash Low back: He is tende near the right SI joint and in the gluteus region.  No significant pain with passive internal hip rotation.  Straight leg raise is negative.  Lower extremity strength and reflexes are normal.  No CVA tenderness.    Imaging: No results found.  Assessment & Plan: 1.  Recurrent right posterior hip pain, possibilities include sacroiliac dysfunction, lumbar foraminal stenosis.  Cannot rule out nephrolithiasis. -Urinalysis ordered.  MRI lumbar spine ordered.  Baclofen and meloxicam as needed.  Could contemplate physical therapy referral or possibly injection depending on MRI findings.     Procedures: No procedures performed        PMFS History: Patient Active Problem List    Diagnosis Date Noted  . Diabetic polyneuropathy associated with type 2 diabetes mellitus ([place]) 06/20/2020  . Gouty arthritis of left foot 06/20/2020  . 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 Diabetic peripheral neuropathy ([place]) 10/04/2019  . Food allergy 07/27/2019  . Healthcare maintenance 05/22/2019  . B12 deficiency 05/17/2019  . Skin irritation 04/02/2019  . Osteomyelitis of second toe of right foot ([place])   . Achilles tendinitis 12/05/2017  . Idiopathic chronic gout, unspecified site, without tophus (tophi) 10/06/2017  . Cutaneous abscess of right foot   . Atypical angina ([place]) 11/08/2015  . Acute diastolic heart failure ([place])   . CAD S/P percutaneous coronary angioplasty   . Chronic atrial fibrillation ([place])   . CKD (chronic kidney disease), stage III ([place])   . Persistent atrial fibrillation ([place])   . Hypertension   . Hyperlipidemia   . Bradycardia   . Back pain 05/24/2015  . Triceps tendonitis 05/24/2015  . Diabetes mellitus without complication ([place]) 69/62/9528  . Dysphagia, pharyngoesophageal phase 09/25/2014  . History of esophageal stricture 09/25/2014  . Chronic anticoagulation 09/25/2014  . Helicobacter pylori duodenitis 09/25/2014  . Cough 03/14/2014  . Benign paroxysmal positional vertigo 03/14/2014  . Encounter  for therapeutic drug monitoring 04/12/2013  . Esophageal stricture from GERD 06/08/2011  . Advance care planning 04/20/2011  . OSA on CPAP 09/22/2010  . HYPERCHOLESTEROLEMIA 08/15/2008  . Gout 08/15/2008  . OBESITY 08/15/2008  . Essential hypertension 08/15/2008  . CAD (coronary artery disease), native coronary artery 08/15/2008  . GASTROESOPHAGEAL REFLUX DISEASE 08/15/2008   Past Medical History:  Diagnosis Date  . Bradycardia    a. nocturnal with pauses overnight on tele likely due to OSA 10/2015.  [name] Cancer ([place])    skin cancer  . CKD (chronic kidney disease), stage III ([place])    . Coronary artery disease    a. prev nonobst. b. LHC 10/24/15: occlusion of small diffusely diseased OM2 s/p balloon angioplasty, mild nonobstructive disease of mLAD and mRCA, normal LVEDP  . Diabetes mellitus without complication ([place])   . Diabetic peripheral neuropathy ([place]) 10/04/2019  . Diverticulosis   . Esophageal reflux   . Esophageal stricture from GERD 06/08/2011   With stricture at GE junction on EGD, dilated 06/2011   . Gastric ulcer    on EGD 2013  . Gout, unspecified    "on daily RX" (09/09/2017)  . History of blood transfusion 02/1964   "when I had right little finger cut off"  . Hyperlipidemia   . Hypertension   . Obesity, unspecified   . Orthopnea   . OSA on CPAP   . Paroxysmal nocturnal dyspnea   . Persistent atrial fibrillation ([place])   . Vertigo     Family History  Problem Relation Age of Onset  . Stomach cancer Mother        died in 14's   . Hypertension Father        died in his 28's pna likely dementia  . Dementia Father   . Pneumonia Father   . Heart disease Brother        S/P CABG  . Hypertension Other   . Prostate cancer Neg Hx   . Colon cancer Neg Hx     Past Surgical History:  Procedure Laterality Date  . AMPUTATION Right 03/03/2019   Procedure: RIGHT 2ND TOE AMPUTATION;  Surgeon: [name], MD;  Location: [facility];  Service: Orthopedics;  Laterality: Right;  . BALLOON DILATION N/A 10/22/2014   Procedure: BALLOON DILATION;  Surgeon: [name], MD;  Location: [facility];  Service: Endoscopy;  Laterality: N/A;  . CARDIAC CATHETERIZATION  07/2008  . CARDIAC CATHETERIZATION N/A 10/24/2015   Procedure: Left Heart Cath and Coronary Angiography;  Surgeon: [name], MD;  Location: [facility];  Service: Cardiovascular;  Laterality: N/A;  . CARDIAC CATHETERIZATION N/A 10/24/2015   Procedure: Coronary Balloon Angioplasty;  Surgeon: [name], MD;  Location: [facility];  Service: Cardiovascular;  Laterality: N/A;  . Carotid  Dopplers  09/2009   no sig extracranial stenosis and vertebral arteries had antegrade flow  . CATARACT EXTRACTION W/ INTRAOCULAR LENS  IMPLANT, BILATERAL Bilateral   . ESOPHAGOGASTRODUODENOSCOPY (EGD) WITH ESOPHAGEAL DILATION     "I've had it stretched 3 times" (10/23/2015)  . ESOPHAGOGASTRODUODENOSCOPY (EGD) WITH ESOPHAGEAL DILATION  06/2011   /medical hx above  (09/09/2017)  . ESOPHAGOGASTRODUODENOSCOPY (EGD) WITH PROPOFOL N/A 10/22/2014   Procedure: ESOPHAGOGASTRODUODENOSCOPY (EGD) WITH PROPOFOL;  Surgeon: [name], MD;  Location: [facility];  Service: Endoscopy;  Laterality: N/A;  . FINGER SURGERY Right 02/1964   "cut little finger off; had it reattached"  . I & D EXTREMITY Right 09/10/2017   Procedure: RIGHT FOOT  DEBRIDEMENT;  Surgeon: [name], MD;  Location: [facility];  Service: Orthopedics;  Laterality: Right;  . MRI of brain  09/2009   chronic microvascular ischemia  . OSA Sleep Study     per Dr. Brett Fairy  . SHOULDER ARTHROSCOPY W/ ROTATOR CUFF REPAIR Right 2008   Social History   Occupational History  . 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 Smoking status: Former Smoker    Packs/day: 0.12    Years: 4.00    Pack years: 0.48    Types: Cigarettes    Quit date: 03/16/1969    Years since quitting: 51.3  . Smokeless tobacco: Former Systems developer  . Tobacco comment: "chewed when I smoked; smoked 1 pack/week for about 4 years, quit 1971  Vaping Use  . Vaping Use: Never used  Substance and Sexual Activity  . Alcohol use: Not Currently    Alcohol/week: 0.0 standard drinks    Comment: 09/09/2017  "Quit all alcohol in 1970's"  . Drug use: Never  . Sexual activity: Not on file

## 2020-07-24 ENCOUNTER — Telehealth: Payer: Self-pay | Admitting: Family Medicine

## 2020-07-24 ENCOUNTER — Other Ambulatory Visit: Payer: Self-pay | Admitting: Family Medicine

## 2020-07-24 LAB — URINALYSIS W MICROSCOPIC + REFLEX CULTURE
Bacteria, UA: NONE SEEN /HPF
Bilirubin Urine: NEGATIVE
Glucose, UA: NEGATIVE
Hgb urine dipstick: NEGATIVE
Ketones, ur: NEGATIVE
Leukocyte Esterase: NEGATIVE
Nitrites, Initial: NEGATIVE
RBC / HPF: NONE SEEN /HPF (ref 0–2)
Specific Gravity, Urine: 1.016 (ref 1.001–1.035)
Squamous Epithelial / HPF: NONE SEEN /HPF (ref <=5)
WBC, UA: NONE SEEN /HPF (ref 0–5)
pH: 5.5 (ref 5.0–8.0)

## 2020-07-24 LAB — NO CULTURE INDICATED

## 2020-07-24 NOTE — Telephone Encounter (Signed)
Urine is negative for infection or blood.

## 2020-07-25 ENCOUNTER — Other Ambulatory Visit: Payer: Self-pay | Admitting: Cardiovascular Disease

## 2020-07-25 ENCOUNTER — Telehealth: Payer: Self-pay

## 2020-07-25 ENCOUNTER — Ambulatory Visit (INDEPENDENT_AMBULATORY_CARE_PROVIDER_SITE_OTHER): Payer: Medicare Other

## 2020-07-25 ENCOUNTER — Other Ambulatory Visit: Payer: Self-pay

## 2020-07-25 DIAGNOSIS — I10 Essential (primary) hypertension: Secondary | ICD-10-CM | POA: Diagnosis not present

## 2020-07-25 DIAGNOSIS — E7800 Pure hypercholesterolemia, unspecified: Secondary | ICD-10-CM | POA: Diagnosis not present

## 2020-07-25 DIAGNOSIS — N1832 Chronic kidney disease, stage 3b: Secondary | ICD-10-CM

## 2020-07-25 DIAGNOSIS — E119 Type 2 diabetes mellitus without complications: Secondary | ICD-10-CM | POA: Diagnosis not present

## 2020-07-25 NOTE — Progress Notes (Addendum)
Chronic Care Management Pharmacy Note  07/25/2020 Name:  [name] MRN:  [id number] DOB:  March 07, 1931  Subjective: [name] is an 87 y.o. year old male who is a primary patient of Damita Dunnings, Elveria Rising, MD.  The CCM team was consulted for assistance with disease management and care coordination needs.    Engaged with patient by telephone for initial visit in response to provider referral for pharmacy case management and/or care coordination services.   Patient concerns: Kidney function declined. He took 1 baclofen and 1 meloxicam due to back pain but is now holding due to kidney function. He is taking Tylenol as needed. Back is sore today but has not needed any medication for it.   Consent to Services:  The patient was given the following information about Chronic Care Management services today, agreed to services, and gave verbal consent: 1. CCM service includes personalized support from designated clinical staff supervised by the primary care provider, including individualized plan of care and coordination with other care providers 2. 24/7 contact phone numbers for assistance for urgent and routine care needs. 3. Service will only be billed when office clinical staff spend 20 minutes or more in a month to coordinate care. 4. Only one practitioner may furnish and bill the service in a calendar month. 5.The patient may stop CCM services at any time (effective at the end of the month) by phone call to the office staff. 6. The patient will be responsible for cost sharing (co-pay) of up to 20% of the service fee (after annual deductible is met). Patient agreed to services and consent obtained.  Patient Care Team: Tonia Ghent, MD as PCP - Cyndia Diver, MD as PCP - Cardiology (Cardiology) Sherren Mocha, MD (Cardiology) Elsie Saas, MD as Consulting Physician (Orthopedic Surgery) Debbora Dus, [facility] as Pharmacist (Pharmacist)  Recent office visits:  07/16/20 - B12  injection  11/23/19 - PCP - Gout f/u - Continue allopurinol 100 mg daily, colchicine. Schedule B12 appointment.  09/11/19 - PCP/AWV - Labs  Recent consult visits:  07/23/20 - Ortho - Lower back pain, UA ordered. MRI ordered. Baclofen and meloxicam as needed.  07/03/20- INR monitoring - Continue taking 1 tablet daily except 1.5 tablets on Sundays and Thursdays. Recheck INR 6 weeks  06/20/20- Dr. Asencion Partridge Dohmeier- Neurology- BiPAP, ordered Split night study. No changes. [facility] Sinus[facility]. No Additional data available. 07/28/19 - Dr. Burt Knack, Cardiology - Chief complaint SOB, recheck Echo, AFIB continue warfarin, HR elevated, increased metoprolol to 25 mg twice daily, remain on diltiazem 180 mg daily. RTC 1 year  Hospital visits: None in previous 6 months  Objective:  Lab Results  Component Value Date   CREATININE 2.20 (H) 07/23/2020   BUN 53 (H) 07/23/2020   GFR 26.26 (L) 07/23/2020   GFRNONAA 47 (L) 03/03/2019   GFRAA 55 (L) 03/03/2019   NA 137 07/23/2020   K 4.9 07/23/2020   CALCIUM 9.2 07/23/2020   CO2 28 07/23/2020   GLUCOSE 121 (H) 07/23/2020    Lab Results  Component Value Date/Time   HGBA1C 7.2 (H) 07/23/2020 08:19 AM   HGBA1C 7.7 (H) 09/11/2019 02:42 PM   GFR 26.26 (L) 07/23/2020 08:19 AM   GFR 43.61 (L) 11/23/2019 01:18 PM    Last diabetic Eye exam:  Lab Results  Component Value Date/Time   HMDIABEYEEXA No Retinopathy 05/26/2019 12:00 AM    Last diabetic Foot exam: 05/19/2019 - PCP    Lab Results  Component Value Date   CHOL 166 05/16/2019   HDL 26.40 (L) 05/16/2019   LDLCALC 82 10/24/2015   LDLDIRECT 95.0 05/16/2019   TRIG 279.0 (H) 05/16/2019   CHOLHDL 6 05/16/2019    Hepatic Function Latest Ref Rng & Units 07/23/2020 09/07/2019 05/16/2019  Total Protein 6.0 - 8.3 g/dL 7.4 7.1 7.4  Albumin 3.5 - 5.2 g/dL 4.2 - 4.2  AST 0 - 37 U/L 21 - 26  ALT 0 - 53 U/L 24 - 24  Alk Phosphatase 39 - 117 U/L 95 - 97  Total Bilirubin  0.2 - 1.2 mg/dL 0.6 - 0.7  Bilirubin, Direct 0.0 - 0.3 mg/dL - - -    Echo 08/2019 - EF Normal   Lab Results  Component Value Date/Time   TSH 2.33 05/16/2019 09:23 AM   TSH 1.56 02/13/2016 02:19 PM    CBC Latest Ref Rng & Units 07/23/2020 09/11/2019 05/16/2019  WBC 4.0 - 10.5 K/uL 8.1 9.2 6.1  Hemoglobin 13.0 - 17.0 g/dL 16.2 15.7 15.8  Hematocrit 39.0 - 52.0 % 49.3 46.7 46.4  Platelets 150.0 - 400.0 K/uL 101.0 Repeated and verified X2.(L) 126.0 Repeated and verified X2.(L) 106.0(L)    No results found for: VD25OH  Clinical ASCVD: Yes  The ASCVD Risk score Mikey Bussing DC Jr., et al., 2013) failed to calculate for the following reasons:   The 2013 ASCVD risk score is only valid for ages 63 to 58    Depression screen PHQ 2/9 05/16/2019 03/30/2019 02/13/2016  Decreased Interest 0 0 0  Down, Depressed, Hopeless 0 0 0  PHQ - 2 Score 0 0 0  Altered sleeping 0 - -  Tired, decreased energy 0 - -  Change in appetite 0 - -  Feeling bad or failure about yourself  0 - -  Trouble concentrating 0 - -  Moving slowly or fidgety/restless 0 - -  Suicidal thoughts 0 - -  PHQ-9 Score 0 - -  Difficult doing work/chores Not difficult at all - -  Some recent data might be hidden    Social History   Tobacco Use  Smoking Status Former Smoker  . Packs/day: 0.12  . Years: 4.00  . Pack years: 0.48  . Types: Cigarettes  . Quit date: 03/16/1969  . Years since quitting: 51.3  Smokeless Tobacco Former Systems developer  Tobacco Comment   "chewed when I smoked; smoked 1 pack/week for about 4 years, quit 1971   BP Readings from Last 3 Encounters:  06/20/20 127/78  11/23/19 116/72  09/11/19 122/62   Pulse Readings from Last 3 Encounters:  06/20/20 87  11/23/19 95  09/11/19 93   Wt Readings from Last 3 Encounters:  06/20/20 215 lb (97.5 kg)  11/23/19 217 lb 9 oz (98.7 kg)  09/11/19 218 lb 3 oz (99 kg)   BMI Readings from Last 3 Encounters:  06/20/20 34.70 kg/m  11/23/19 35.12 kg/m  09/11/19 35.22 kg/m     Assessment/Interventions: Review of patient past medical history, allergies, medications, health status, including review of consultants reports, laboratory and other test data, was performed as part of comprehensive evaluation and provision of chronic care management services.   SDOH:  (Social Determinants of Health) assessments and interventions performed: Yes SDOH Interventions   Flowsheet Row Most Recent Value  SDOH Interventions   Financial Strain Interventions Intervention Not Indicated  [Medications affordable]     SDOH Screenings   Alcohol Screen: Not on file  Depression (NIO2-7): Not on file  Financial Resource Strain: Low  Risk   . Difficulty of Paying Living Expenses: Not very hard  Food Insecurity: Not on file  Housing: Not on file  Physical Activity: Not on file  Social Connections: Not on file  Stress: Not on file  Tobacco Use: Medium Risk  . Smoking Tobacco Use: Former Smoker  . Smokeless Tobacco Use: Former Soil scientist Needs: Not on file    [place]  Allergies  Allergen Reactions  . Penicillins Shortness Of Breath and Swelling    Patient tolerated Rocephin injection 2019  PATIENT HAS HAD A PCN REACTION WITH IMMEDIATE RASH, FACIAL/TONGUE/THROAT SWELLING, SOB, OR LIGHTHEADEDNESS WITH HYPOTENSION:  #  #  YES  #  Has patient had a PCN reaction causing severe rash involving mucus membranes or skin necrosis: NO Has patient had a PCN reaction that required hospitalization NO Has patient had a PCN reaction occurring within the last 10 years: NO If all of the above answers are "NO", then may proceed with Cephalosporin use.   . Atorvastatin Other (See Comments)    No energy, just felt bad    . Pravastatin Other (See Comments)    myalgias  . Hydrocodone Other (See Comments)    "swimmy headed" after use  . Tomato     Presumed allergy  . Uloric [Febuxostat] Other (See Comments)    Would avoid, worsening gout sx with use.   . Vancomycin Other  (See Comments)    Red man syndrome- tolerated with slow run and benadryl use  . Biaxin [Clarithromycin] Diarrhea  . Doxycycline Other (See Comments)    GI upset    Medications Reviewed Today    Reviewed by Debbora Dus, [facility] (Pharmacist) on 07/25/20 at 1206  Med List Status: <None>  Medication Order Taking? Sig Documenting Provider Last Dose Status Informant  acetaminophen (TYLENOL) 500 MG tablet [id number] Yes Take 1,000 mg by mouth as needed for mild pain. [provider] Taking Active Self  allopurinol (ZYLOPRIM) 100 MG tablet [id number] Yes TAKE 1 TABLET BY MOUTH EVERY DAY Tonia Ghent, MD Taking Active            Med Note Edison Pace, [place]   Wed Dec 13, 2019 10:52 AM) Dewaine Conger 1 every night  baclofen (LIORESAL) 10 MG tablet [id number] No Take 0.5-1 tablets (5-10 mg total) by mouth at bedtime as needed for muscle spasms.  Patient not taking: Reported on 07/25/2020   Hilts, Legrand Como, MD Not Taking Active   Colchicine (MITIGARE) 0.6 MG CAPS [id number] Yes Take 1 tablet by mouth 2 (two) times daily as needed (Mitigare). Tonia Ghent, MD Taking Active            Med Note Andree Elk, [facility]   Thu Jul 25, 2020 11:22 AM) Only taking PRN flares   diltiazem (CARDIZEM CD) 180 MG 24 hr capsule [id number] Yes Take 1 capsule (180 mg total) by mouth daily. Sherren Mocha, MD Taking Active   furosemide (LASIX) 20 MG tablet [id number] Yes TAKE 1 TABLET BY MOUTH DAILY. PLEASE MAKE YEARLY APPT WITH DR. Burt Knack FOR MAY BEFORE ANYMORE REFILLS Sherren Mocha, MD Taking Active   Insulin Syringe-Needle U-100 (B-D INSULIN SYRINGE 1CC/25GX1") 25G X 1" 1 ML MISC [id number] No Use with B12 shots  Patient not taking: Reported on 07/25/2020   Tonia Ghent, MD Not Taking Consider Medication Status and Discontinue (Change in therapy)   lisinopril (ZESTRIL) 20 MG tablet [id number] Yes Take 1 tablet (20 mg total) by mouth daily. Please keep upcoming appt in June  2022 with Dr. Burt Knack before anymore refills. Thank  you Sherren Mocha, MD Taking Active   metoprolol tartrate (LOPRESSOR) 25 MG tablet [id number] Yes Take 1 tablet (25 mg total) by mouth 2 (two) times daily. Sherren Mocha, MD Taking Active   warfarin (COUMADIN) 4 MG tablet [id number] Yes TAKE AS DIRECTED BY COUMADIN CLINIC Sherren Mocha, MD Taking Active           Patient Active Problem List   Diagnosis Date Noted  . Diabetic polyneuropathy associated with type 2 diabetes mellitus ([place]) 06/20/2020  . Gouty arthritis of left foot 06/20/2020  . 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 Diabetic peripheral neuropathy ([place]) 10/04/2019  . Food allergy 07/27/2019  . Healthcare maintenance 05/22/2019  . B12 deficiency 05/17/2019  . Skin irritation 04/02/2019  . Osteomyelitis of second toe of right foot ([place])   . Achilles tendinitis 12/05/2017  . Idiopathic chronic gout, unspecified site, without tophus (tophi) 10/06/2017  . Cutaneous abscess of right foot   . Atypical angina ([place]) 11/08/2015  . Acute diastolic heart failure ([place])   . CAD S/P percutaneous coronary angioplasty   . Chronic atrial fibrillation ([place])   . CKD (chronic kidney disease), stage III ([place])   . Persistent atrial fibrillation ([place])   . Hypertension   . Hyperlipidemia   . Bradycardia   . Back pain 05/24/2015  . Triceps tendonitis 05/24/2015  . Diabetes mellitus without complication ([place]) 56/21/3086  . Dysphagia, pharyngoesophageal phase 09/25/2014  . History of esophageal stricture 09/25/2014  . Chronic anticoagulation 09/25/2014  . Helicobacter pylori duodenitis 09/25/2014  . Cough 03/14/2014  . Benign paroxysmal positional vertigo 03/14/2014  . Encounter for therapeutic drug monitoring 04/12/2013  . Esophageal stricture from GERD 06/08/2011  . Advance care planning 04/20/2011  . OSA on CPAP 09/22/2010  . HYPERCHOLESTEROLEMIA 08/15/2008  . Gout 08/15/2008  . OBESITY 08/15/2008  . Essential  hypertension 08/15/2008  . CAD (coronary artery disease), native coronary artery 08/15/2008  . GASTROESOPHAGEAL REFLUX DISEASE 08/15/2008    Immunization History  Administered Date(s) Administered  . Influenza, High Dose Seasonal PF 12/15/2016, 12/19/2018  . Influenza, Seasonal, Injecte, Preservative Fre 12/14/2013  . Influenza,inj,Quad PF,6+ Mos 12/17/2014, 01/29/2018  . Influenza-Unspecified 12/15/2016, 12/19/2018  . Moderna Sars-Covid-2 Vaccination 04/11/2019, 05/09/2019  . Pneumococcal Conjugate-13 12/17/2014  . Pneumococcal Polysaccharide-23 09/19/2009  . Td 09/19/2009  . Zoster 09/19/2009    Conditions to be addressed/monitored:  Hypertension, Hyperlipidemia and Diabetes  Care Plan : [place]  Updates made by Debbora Dus, [facility] since 07/25/2020 12:00 AM    Problem: CHL AMB "PATIENT-SPECIFIC PROBLEM"     Long-Range Goal: Patient Stated   Priority: High  Note:   Current Barriers:  . Acute decline in kidney function  Pharmacist Clinical Goal(s):  [name] Patient will contact provider office for questions/concerns as evidenced notation of same in electronic health record through collaboration with PharmD and provider.   Interventions: . 1:1 collaboration with Tonia Ghent, MD regarding development and update of comprehensive plan of care as evidenced by provider attestation and co-signature . Inter-disciplinary care team collaboration (see longitudinal plan of care) . Comprehensive medication review performed; medication list updated in electronic medical record  Hypertension (BP goal <140/90) -Controlled - per home and clinic readings  -Current treatment: . Lisinopril 20 mg - 1 tablet daily . Metoprolol tartrate 25 mg - 1 tablet twice daily . Diltiazem 180 mg - 1 capsule daily -Medications previously tried: none  -  Current home readings: 115/67, 88 2 PM yesterday, 118/67, 89 (4/25 5 PM) -Denies hypotensive/hypertensive symptoms -Educated on Symptoms of  hypotension and importance of maintaining adequate hydration; -Counseled to monitor BP at home twice monthly, document, and provide log at future appointments -Recommended to continue current medication; Increase water intake.   Hyperlipidemia: (LDL goal < 70- CAD) -Not ideally controlled - LDL 95 -Current treatment: . None  -Medications previously tried: pravastatin, atorvastatin - myalgias -Statin intolerant per allergy list  -Recommended  lifestyle management - watch foods high in cholesterol  Diabetes (A1c goal < 7.5%) -Controlled - A1c 7.2% -Current medications: . None  -Medications previously tried: none   -Current home glucose readings - none, does not monitor -Denies hypoglycemic/hyperglycemic symptoms -Educated on A1c and blood sugar goals; Provide handout on carbohydrate counting -Counseled to check feet daily and get yearly eye exams - these are past due  -Recommended schedule annual eye exam and foot exam  Atrial Fibrillation (Goal: prevent stroke and major bleeding) -Followed by Dr. Burt Knack, last seen June 2021 -Current treatment: . Rate control: Metoprolol tartrate 25 mg - 1 tablet twice daily  . Anticoagulation: Warfarin 4 mg - as directed -Counseled on importance of adherence to anticoagulant exactly as prescribed; avoidance of NSAIDs due to increased bleeding risk with anticoagulants; -Recommended to continue current medication; Follow up with Dr. Burt Knack in June 2022.  Heart Failure (Goal: manage symptoms and prevent exacerbations) -Controlled - denies symptoms  -Diagnosed Dr. Ellyn Hack 10/2015, started on Lasix at that time  -Last ejection fraction: Normal (2021) -HF type: Diastolic -Current treatment: . Furosemide 20 mg - 1 tablet daily  -Medications previously tried: none -Educated on Importance of weighing daily; if you gain more than 3 pounds in one day or 5 pounds in one week, call the office -Recommended to continue current medication  Gout (Goal:  Prevent flare up, uric acid levels within normal limit) Controlled - last flare > 1 month ago Uric acid levels WNL  -Current treatment: . Urate Lowering therapy: Allopurinol 100 mg - 1 tablet daily . Acute therapy: Cholchicine 0.6 mg - 1 tablet twice daily at first onset of a flare until resolved  -Medications previously tried: Uloric - ineffective -Recommended to continue current medication  GERD (Goal: Control symptoms) Controlled - per patient report -Current treatment:  Omeprazole 40 mg - 1 daily  -Medications previously tried: None reported -Recommended to continue current medication  Other Tylenol PRN Equate Zyrtec daily  Pataday once a day  Denies other vitamins or supplements  Patient Goals/Self-Care Activities . Patient will:  - Increase water intake  Follow Up Plan: Telephone follow up appointment with care management team member scheduled for: 30 days to review labs and medication dose adjustments if needed for kidney function; PCP visit in 4 days      Medication Assistance: None required.  Patient affirms current coverage meets needs.  Star Rating Drugs: Medication:                Last Fill:         Day Supply Lisinopril 20 mg           04/24/20              90  Patient's preferred pharmacy is:  [id number] [place], [place] [id number]: [phone]: [phone] Current pharmacy is preferred with insurance.  Care Plan and Follow Up Patient Decision:  Patient agrees to Care Plan and Follow-up.  Debbora Dus, PharmD Clinical Pharmacist [facility] [phone]  Encounter details: CCM Time Spent      Value Time User   Time spent with patient (minutes)  60 07/25/2020 12:13 PM Debbora Dus, [facility]   Time spent performing Chart review  30 07/25/2020 12:12 PM Debbora Dus, [facility]   Total time (minutes)  90 07/25/2020 12:13 PM Debbora Dus, RPH     Moderate to High Complex Decision Making      Value Time User   Moderate to High complex decision making  Yes 07/25/2020 12:12 PM Debbora Dus, [facility]     CCM Services: This encounter meets complex CCM services and moderate to high decision making.  Prior to outreach and patient consent for Chronic Care Management, I referred this patient for services after reviewing the nominated patient list or from a personal encounter with the patient.  I have personally reviewed this encounter including the documentation in this note and have collaborated with the care management provider regarding care management and care coordination activities to include development and update of the comprehensive care plan. I am certifying that I agree with the content of this note and encounter as supervising physician.

## 2020-07-25 NOTE — Telephone Encounter (Signed)
Discussed recent decline in kidney function with patient's daughter. Would like to know if we can recheck kidney function at office visit Monday, 07/29/20?   Debbora Dus, PharmD Clinical Pharmacist [facility] [phone]

## 2020-07-25 NOTE — Telephone Encounter (Signed)
Yes, I made a note about that. Thanks.

## 2020-07-25 NOTE — Telephone Encounter (Signed)
Spoke with the patient. He is aware that this can be checked at his next appointment. Nothing further needed.

## 2020-07-25 NOTE — Patient Instructions (Addendum)
Jul 25, 2020  Dear [name],  It was a pleasure meeting you during our initial appointment on Jul 25, 2020. Below is a summary of the goals we discussed and components of chronic care management. Please contact me anytime with questions or concerns.   Visit Information  Patient Care Plan: CCM Pharmacy Care Plan    Problem Identified: CHL AMB "PATIENT-SPECIFIC PROBLEM"     Long-Range Goal: Patient Stated   Priority: High  Note:   Current Barriers:  . Acute decline in kidney function  Pharmacist Clinical Goal(s):  [name] Patient will contact provider office for questions/concerns as evidenced notation of same in electronic health record through collaboration with PharmD and provider.   Interventions: . 1:1 collaboration with Tonia Ghent, MD regarding development and update of comprehensive plan of care as evidenced by provider attestation and co-signature . Inter-disciplinary care team collaboration (see longitudinal plan of care) . Comprehensive medication review performed; medication list updated in electronic medical record  Hypertension (BP goal <140/90) -Controlled - per home and clinic readings  -Current treatment: . Lisinopril 20 mg - 1 tablet daily . Metoprolol tartrate 25 mg - 1 tablet twice daily . Diltiazem 180 mg - 1 capsule daily -Medications previously tried: none  -Current home readings: 115/67, 88 2 PM yesterday, 118/67, 89 (4/25 5 PM) -Denies hypotensive/hypertensive symptoms -Educated on Symptoms of hypotension and importance of maintaining adequate hydration; -Counseled to monitor BP at home twice monthly, document, and provide log at future appointments -Recommended to continue current medication; Increase water intake.   Hyperlipidemia: (LDL goal < 70- CAD) -Not ideally controlled - LDL 95 -Current treatment: . None  -Medications previously tried: pravastatin, atorvastatin - myalgias -Statin intolerant per allergy list  -Recommended  lifestyle  management - watch foods high in cholesterol  Diabetes (A1c goal < 7.5%) -Controlled - A1c 7.2% -Current medications: . None  -Medications previously tried: none   -Current home glucose readings - none, does not monitor -Denies hypoglycemic/hyperglycemic symptoms -Educated on A1c and blood sugar goals; Provide handout on carbohydrate counting -Counseled to check feet daily and get yearly eye exams - these are past due  -Recommended schedule annual eye exam and foot exam  Atrial Fibrillation (Goal: prevent stroke and major bleeding) -Followed by Dr. Burt Knack, last seen June 2021 -Current treatment: . Rate control: Metoprolol tartrate 25 mg - 1 tablet twice daily  . Anticoagulation: Warfarin 4 mg - as directed -Counseled on importance of adherence to anticoagulant exactly as prescribed; avoidance of NSAIDs due to increased bleeding risk with anticoagulants; -Recommended to continue current medication; Follow up with Dr. Burt Knack in June 2022.  Heart Failure (Goal: manage symptoms and prevent exacerbations) -Controlled - denies symptoms  -Diagnosed Dr. Ellyn Hack 10/2015, started on Lasix at that time  -Last ejection fraction: Normal (2021) -HF type: Diastolic -Current treatment: . Furosemide 20 mg - 1 tablet daily  -Medications previously tried: none -Educated on Importance of weighing daily; if you gain more than 3 pounds in one day or 5 pounds in one week, call the office -Recommended to continue current medication  Gout (Goal: Prevent flare up, uric acid levels within normal limit) Controlled - last flare > 1 month ago Uric acid levels WNL  -Current treatment: . Urate Lowering therapy: Allopurinol 100 mg - 1 tablet daily . Acute therapy: Cholchicine 0.6 mg - 1 tablet twice daily at first onset of a flare until resolved  -Medications previously tried: Uloric - ineffective -Recommended to continue current medication  GERD (Goal:  Control symptoms) Controlled - per patient  report -Current treatment:  Omeprazole 40 mg - 1 daily  -Medications previously tried: None reported -Recommended to continue current medication  Other Tylenol PRN Equate Zyrtec daily  Pataday once a day  Denies other vitamins or supplements  Patient Goals/Self-Care Activities . Patient will:  - Increase water intake  Follow Up Plan: Telephone follow up appointment with care management team member scheduled for: 30 days to review labs and medication dose adjustments if needed for kidney function; PCP visit in 4 days      [name] was given information about Chronic Care Management services today including:  1. CCM service includes personalized support from designated clinical staff supervised by his physician, including individualized plan of care and coordination with other care providers 2. 24/7 contact phone numbers for assistance for urgent and routine care needs. 3. Standard insurance, coinsurance, copays and deductibles apply for chronic care management only during months in which we provide at least 20 minutes of these services. Most insurances cover these services at 100%, however patients may be responsible for any copay, coinsurance and/or deductible if applicable. This service may help you avoid the need for more expensive face-to-face services. 4. Only one practitioner may furnish and bill the service in a calendar month. 5. The patient may stop CCM services at any time (effective at the end of the month) by phone call to the office staff.  Patient agreed to services and verbal consent obtained.   The patient verbalized understanding of instructions, educational materials, and care plan provided today and agreed to receive a mailed copy of patient instructions, educational materials, and care plan.   Debbora Dus, PharmD Clinical Pharmacist [facility] [phone]   Chronic Kidney Disease, Adult Chronic kidney disease is when lasting  damage happens to the kidneys slowly over a long time. The kidneys help to:  Make pee (urine).  Make hormones.  Keep the right amount of fluids and chemicals in the body. Most often, this disease does not go away. You must take steps to help keep the kidney damage from getting worse. If steps are not taken, the kidneys might stop working forever. What are the causes?  Diabetes.  High blood pressure.  Diseases that affect the heart and blood vessels.  Other kidney diseases.  Diseases of the body's disease-fighting system.  A problem with the flow of pee.  Infections of the organs that make pee, store it, and take it out of the body.  Swelling or irritation of your blood vessels. What increases the risk?  Getting older.  Having someone in your family who has kidney disease or kidney failure.  Having a disease caused by genes.  Taking medicines often that harm the kidneys.  Being near or having contact with harmful substances.  Being very overweight.  Using tobacco now or in the past. What are the signs or symptoms?  Feeling very tired.  Having a swollen face, legs, ankles, or feet.  Feeling like you may vomit or vomiting.  Not feeling hungry.  Being confused or not able to focus.  Twitches and cramps in the leg muscles or other muscles.  Dry, itchy skin.  A taste of metal in your mouth.  Making less pee, or making more pee.  Shortness of breath.  Trouble sleeping. You may also become anemic or get weak bones. Anemic means there is not enough red blood cells or hemoglobin in your blood. You may get symptoms slowly.  You may not notice them until the kidney damage gets very bad. How is this treated? Often, there is no cure for this disease. Treatment can help with symptoms and help keep the disease from getting worse. You may need to:  Avoid alcohol.  Avoid foods that are high in salt, potassium, phosphorous, and protein.  Take medicines for symptoms  and to help control other conditions.  Have dialysis. This treatment gets harmful waste out of your body.  Treat other problems that cause your kidney disease or make it worse. Follow these instructions at home: Medicines  Take over-the-counter and prescription medicines only as told by your doctor.  Do not take any new medicines, vitamins, or supplements unless your doctor says it is okay. Lifestyle  Do not smoke or use any products that contain nicotine or tobacco. If you need help quitting, ask your doctor.  If you drink alcohol: ? Limit how much you use to:  0-1 drink a day for women who are not pregnant.  0-2 drinks a day for men. ? Know how much alcohol is in your drink. In the [place] one drink equals one 12 oz bottle of beer (355 mL), one 5 oz glass of wine (148 mL), or one 1 oz glass of hard liquor (44 mL).  Stay at a healthy weight. If you need help losing weight, ask your doctor.   General instructions  Follow instructions from your doctor about what you cannot eat or drink.  Track your blood pressure at home. Tell your doctor about any changes.  If you have diabetes, track your blood sugar.  Exercise at least 30 minutes a day, 5 days a week.  Keep your shots (vaccinations) up to date.  Keep all follow-up visits.   Where to find more information  American Association of Kidney Patients: BombTimer.gl  National Kidney Foundation: www.kidney.[place]: https://mathis.com/  Life Options: www.lifeoptions.org  Kidney School: www.kidneyschool.org Contact a doctor if:  Your symptoms get worse.  You get new symptoms. Get help right away if:  You get symptoms of end-stage kidney disease. These include: ? Headaches. ? Losing feeling in your hands or feet. ? Easy bruising. ? Having hiccups often. ? Chest pain. ? Shortness of breath. ? Lack of menstrual periods, in women.  You have a fever.  You make less pee than normal.  You have pain or  you bleed when you pee or poop. These symptoms may be an emergency. Get help right away. Call your local emergency services (911 in the [place]  Do not wait to see if the symptoms will go away.  Do not drive yourself to the hospital. Summary  Chronic kidney disease is when lasting damage happens to the kidneys slowly over a long time.  Causes of this disease include diabetes and high blood pressure.  Often, there is no cure for this disease. Treatment can help symptoms and help keep the disease from getting worse.  Treatment may involve lifestyle changes, medicines, and dialysis. This information is not intended to replace advice given to you by your health care provider. Make sure you discuss any questions you have with your health care provider. Document Revised: 06/07/2019 Document Reviewed: 06/07/2019 Elsevier Patient Education  [place].

## 2020-07-26 ENCOUNTER — Other Ambulatory Visit: Payer: Self-pay

## 2020-07-26 ENCOUNTER — Ambulatory Visit (INDEPENDENT_AMBULATORY_CARE_PROVIDER_SITE_OTHER): Payer: Medicare Other | Admitting: Neurology

## 2020-07-26 DIAGNOSIS — E1142 Type 2 diabetes mellitus with diabetic polyneuropathy: Secondary | ICD-10-CM

## 2020-07-26 DIAGNOSIS — Z9989 Dependence on other enabling machines and devices: Secondary | ICD-10-CM

## 2020-07-26 DIAGNOSIS — G4731 Primary central sleep apnea: Secondary | ICD-10-CM

## 2020-07-26 DIAGNOSIS — I4819 Other persistent atrial fibrillation: Secondary | ICD-10-CM

## 2020-07-26 DIAGNOSIS — M109 Gout, unspecified: Secondary | ICD-10-CM

## 2020-07-29 ENCOUNTER — Encounter: Payer: Self-pay | Admitting: Family Medicine

## 2020-07-29 ENCOUNTER — Other Ambulatory Visit: Payer: Self-pay

## 2020-07-29 ENCOUNTER — Ambulatory Visit (INDEPENDENT_AMBULATORY_CARE_PROVIDER_SITE_OTHER): Payer: Medicare Other | Admitting: Family Medicine

## 2020-07-29 VITALS — BP 112/62 | HR 85 | Temp 98.1°F | Ht 66.0 in | Wt 210.0 lb

## 2020-07-29 DIAGNOSIS — R7989 Other specified abnormal findings of blood chemistry: Secondary | ICD-10-CM | POA: Diagnosis not present

## 2020-07-29 DIAGNOSIS — I482 Chronic atrial fibrillation, unspecified: Secondary | ICD-10-CM | POA: Diagnosis not present

## 2020-07-29 DIAGNOSIS — I2510 Atherosclerotic heart disease of native coronary artery without angina pectoris: Secondary | ICD-10-CM | POA: Diagnosis not present

## 2020-07-29 DIAGNOSIS — E1142 Type 2 diabetes mellitus with diabetic polyneuropathy: Secondary | ICD-10-CM

## 2020-07-29 DIAGNOSIS — M549 Dorsalgia, unspecified: Secondary | ICD-10-CM

## 2020-07-29 DIAGNOSIS — E538 Deficiency of other specified B group vitamins: Secondary | ICD-10-CM | POA: Diagnosis not present

## 2020-07-29 DIAGNOSIS — G720 Drug-induced myopathy: Secondary | ICD-10-CM

## 2020-07-29 NOTE — Progress Notes (Signed)
This visit occurred during the SARS-CoV-2 public health emergency.  Safety protocols were in place, including screening questions prior to the visit, additional usage of staff PPE, and extensive cleaning of exam room while observing appropriate contact time as indicated for disinfecting solutions.  AMW visit deferred given other issues below.    Cr elevation.  He has been taking allopurinol.  Not recently taking colchicine.  No recent NSAIDS except for a few doses of meloxicam- stopped in the meantime.  Had used tylenol for pain if needed.  Drinking more water recently.  Still taking lasix 1 a day, at baseline.  Taking lisinopril 20mg  a day.  Taking diltiazem daily.  He is off omeprazole in the meantime.    He has tolerated tomatoes in the meantime.  It appears that he does not have a tomato allergy.  Allergy list reviewed.  H/o AF.  No exertional CP.  Statin intolerant.  Still on coumadin.  H/o thrombocytopenia noted but no bleeding.  DM2.  Sugar this AM was 99.  No meds for Dm2. A1c improved, down to 7.2.  Due for eye exam, d/w pt.  statin intolerant.    B12 def. Recheck level wnl.  Labs discussed with patient.  He has MRI pending re: back pain, had prev radiated to the R lower abdomen.  Pain is better now but not resolved.    He noted post nasal gtt, clear/white.  Not yellow.  No fevers.  No vomiting, no diarrhea.    Meds, vitals, and allergies reviewed.   ROS: Per HPI unless specifically indicated in ROS section   GEN: nad, alert and oriented HEENT: ncat, bilateral ear canals with cerumen but otherwise tympanic membranes normal. OP without erythema. 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  No skin breakdown No calluses  Normal DP pulses Normal sensation to light touch but dec to monofilament Nails normal

## 2020-07-29 NOTE — Patient Instructions (Addendum)
Don't take meloxicam for now.  Go to the lab on the way out.   If you have mychart we'll likely use that to update you.    Take care.  Glad to see you.  You can try zyrtec 10mg  at night.  Keep using flonase.

## 2020-07-30 LAB — BASIC METABOLIC PANEL
BUN: 31 mg/dL — ABNORMAL HIGH (ref 6–23)
CO2: 28 mEq/L (ref 19–32)
Calcium: 9.2 mg/dL (ref 8.4–10.5)
Chloride: 102 mEq/L (ref 96–112)
Creatinine, Ser: 2.06 mg/dL — ABNORMAL HIGH (ref 0.40–1.50)
GFR: 28.41 mL/min — ABNORMAL LOW (ref >60.00)
Glucose, Bld: 114 mg/dL — ABNORMAL HIGH (ref 70–99)
Potassium: 4.8 mEq/L (ref 3.5–5.1)
Sodium: 136 mEq/L (ref 135–145)

## 2020-07-31 ENCOUNTER — Other Ambulatory Visit: Payer: Self-pay | Admitting: Family Medicine

## 2020-07-31 DIAGNOSIS — I4819 Other persistent atrial fibrillation: Secondary | ICD-10-CM

## 2020-07-31 DIAGNOSIS — G720 Drug-induced myopathy: Secondary | ICD-10-CM | POA: Insufficient documentation

## 2020-07-31 DIAGNOSIS — R42 Dizziness and giddiness: Secondary | ICD-10-CM

## 2020-07-31 DIAGNOSIS — R7989 Other specified abnormal findings of blood chemistry: Secondary | ICD-10-CM | POA: Insufficient documentation

## 2020-07-31 DIAGNOSIS — I2510 Atherosclerotic heart disease of native coronary artery without angina pectoris: Secondary | ICD-10-CM

## 2020-07-31 MED ORDER — LISINOPRIL 20 MG PO TABS: 0.5000 | ORAL_TABLET | Freq: Every day | ORAL | 0 refills | Status: DC

## 2020-07-31 NOTE — Assessment & Plan Note (Addendum)
He has MRI pending.  I will defer for now and await the MRI results.

## 2020-07-31 NOTE — Assessment & Plan Note (Signed)
B12 normal.  Discussed with patient.

## 2020-07-31 NOTE — Assessment & Plan Note (Signed)
Routine cautions discussed with patient, especially regarding foot care.  A1c improved.  No change in medications at this point.

## 2020-07-31 NOTE — Assessment & Plan Note (Signed)
Statin intolerant 

## 2020-07-31 NOTE — Assessment & Plan Note (Signed)
Continue anticoagulation.  Not tachycardic.  No exertional chest pain.

## 2020-07-31 NOTE — Assessment & Plan Note (Signed)
Advised not to use NSAIDs.  Recheck pending.  See notes on labs.

## 2020-08-04 ENCOUNTER — Other Ambulatory Visit: Payer: Self-pay

## 2020-08-04 ENCOUNTER — Ambulatory Visit
Admission: RE | Admit: 2020-08-04 | Discharge: 2020-08-04 | Disposition: A | Discharge status: Home / Self Care | Payer: Medicare Other | Source: Ambulatory Visit | Attending: Family Medicine | Admitting: Family Medicine

## 2020-08-04 DIAGNOSIS — M48061 Spinal stenosis, lumbar region without neurogenic claudication: Secondary | ICD-10-CM | POA: Diagnosis not present

## 2020-08-04 DIAGNOSIS — M545 Low back pain, unspecified: Secondary | ICD-10-CM | POA: Diagnosis not present

## 2020-08-04 DIAGNOSIS — U071 COVID-19: Secondary | ICD-10-CM | POA: Diagnosis not present

## 2020-08-05 ENCOUNTER — Telehealth: Payer: Self-pay | Admitting: Family Medicine

## 2020-08-05 DIAGNOSIS — M545 Low back pain, unspecified: Secondary | ICD-10-CM

## 2020-08-05 NOTE — Telephone Encounter (Signed)
MRI shows several disc protrusions causing narrowing of the nerve openings.  No nerve compression.  Treatment options include trying physical therapy, or possibly an epidural injection.

## 2020-08-06 ENCOUNTER — Telehealth: Payer: Self-pay

## 2020-08-06 NOTE — Progress Notes (Signed)
CCM care plan from [facility] appointment on 07/25/20 with Debbora Dus, Pharm. D, was mailed to him per his request.   Margaretmary Dys, [place] [facility] Assistant [phone]

## 2020-08-06 NOTE — Addendum Note (Signed)
Addended by: Hortencia Pilar on: 08/06/2020 10:06 AM   Modules accepted: Orders

## 2020-08-07 ENCOUNTER — Telehealth: Payer: Self-pay | Admitting: Neurology

## 2020-08-07 NOTE — Telephone Encounter (Signed)
Noted thank you

## 2020-08-07 NOTE — Telephone Encounter (Signed)
Pt's daughter is asking that pt's DME be [facility] locally, daughter is aware that once results to Sleep study are available Dr will have her RN to call with results.  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

## 2020-08-08 ENCOUNTER — Telehealth: Payer: Self-pay | Admitting: Physical Medicine and Rehabilitation

## 2020-08-08 NOTE — Telephone Encounter (Signed)
Patient's daughter Cassandria Santee called requesting a call back to reschedule appt. She states he dad is available after 6/10. Please call pt's daughter at [phone].

## 2020-08-08 NOTE — Telephone Encounter (Signed)
Called pt and r/s 

## 2020-08-09 DIAGNOSIS — Z23 Encounter for immunization: Secondary | ICD-10-CM | POA: Diagnosis not present

## 2020-08-13 NOTE — Progress Notes (Signed)
POLYSOMNOGRAPHY IMPRESSION:   1. Obstructive Sleep Apnea(OSA) controlled under BiPAP PRESSURE of 17/12 cm water. FFM medium size Quattro.   2. Periodic Limb Movement in REM sleep.  3. Very irregular EKG pattern -single channel.  4. Dependent on PAP therapy, the patient was unable to initiate sleep without PAP therapy.    RECOMMENDATIONS: I will URGENTLY order a new BiPAP device at the above named setting.   A follow up appointment will be scheduled in the Sleep Clinic at [facility].

## 2020-08-13 NOTE — Procedures (Signed)
PATIENT'S NAME:  [name], [name] DOB:      06/30/1930      MR#:    [id number]     DATE OF RECORDING: 07/26/2020  Richard Miu REFERRING M.D.:  Elsie Stain, MD Study Performed:  Split-Night Titration Study HISTORY:  [name] is a 87 y.o Caucasian male patient who was seen for a sleep medicine consultation, pre PCP, on 06/20/2020.   Chief concern according to patient: The patient has seen me for Vertigo- over a decade ago.  The patient has not undergone any sleep testing in over a decade, his current machine is actually a replacement BiPAP machine, and as I understand he was not retested recently. His original sleep test may have been over 15 years ago, on [place] but my epic records do not reach that far back. He was a client of [facility].  Apparently, he must have been on BiPAP before because when this current machine which is now over 42 years old was given -he was not asked to retest and he was not given a different setting from what he used before. Now this machine displays an " end of lifetime " message.    [name]  has a past medical history of Bradycardia, Cancer ([place]), CKD (chronic kidney disease), stage III ([place]), Coronary artery disease, Diabetes mellitus without complication ([place]), Diabetic peripheral neuropathy ([place]) (10/04/2019), Diverticulosis, Esophageal reflux, Esophageal stricture from GERD (06/08/2011), Gastric ulcer, Gout, unspecified, History of blood transfusion (02/1964), Hyperlipidemia, Hypertension, Obesity, unspecified, Orthopnea, OSA on BiPAP, Paroxysmal nocturnal dyspnea, Persistent atrial fibrillation ([place]), and Vertigo. The patient had the first sleep study in the year 2006 - I have no access to his original data, and there has been no Baseline since (?) Sleep relevant medical history: CPAP / BiPAP dependent,  Nocturia once, no dream enactment. DDD, cervical spine  The patient endorsed the Epworth Sleepiness Scale at 2/24 points   The patient's  weight 215 pounds with a height of 66 (inches), resulting in a BMI of 34.7 kg/m2. The patient's neck circumference measured 19 inches.  CURRENT MEDICATIONS: Tylenol, Zyloprim, Cardizem CD, Epinephrine, Lasix, Zestril, Lopressor, Coumadin   PROCEDURE:  This is a multichannel digital polysomnogram utilizing the Somnostar 11.2 system.  Electrodes and sensors were applied and monitored per AASM Specifications.   EEG, EOG, Chin and Limb EMG, were sampled at 200 Hz.  ECG, Snore and Nasal Pressure, Thermal Airflow, Respiratory Effort, CPAP Flow and Pressure, Oximetry was sampled at 50 Hz. Digital video and audio were recorded.      BASELINE STUDY WITHOUT CPAP RESULTS: Lights Out was at 21:04 and Lights On at 04:51.  Total recording time (TRT) was 126, with a total sleep time (TST) of 28.5 minutes.   The patient's sleep latency was 87 minutes.  REM latency was 0 minutes.  The sleep efficiency was poor at 22.6 %. The patient is dependent on PAP therapy.    SLEEP ARCHITECTURE: WASO (Wake after sleep onset) was 0 minutes, Stage N1 was 8 minutes, Stage N2 was 20.5 minutes, Stage N3 was 0 minutes and Stage R (REM sleep) was 0 minutes.  The percentages were Stage N1 28.1%, Stage N2 71.9%, Stage N3 0% and Stage R (REM sleep) 0%.   RESPIRATORY ANALYSIS:  There were a total of 7 respiratory events:  0 obstructive apneas, 0 central apneas and 0 mixed apneas with a total of 0 apneas and an apnea index (AI) of 0. There were 7 hypopneas with a hypopnea  index of 14.7. The patient also had 0 respiratory event related arousals (RERAs).     The total APNEA/HYPOPNEA INDEX (AHI) was 14.7 /hour and the total RESPIRATORY DISTURBANCE INDEX was 14.7 /hour.  0 events occurred in REM sleep and 14 events in NREM. The REM AHI was 0 /hour versus a non-REM AHI of 14.7 /hour.  The patient spent 8 minutes sleep time in the supine position 292 minutes in non-supine. The supine AHI was 22.5 /hour versus a non-supine AHI of 11.7  /hour.  OXYGEN SATURATION & C02:  The wake baseline 02 saturation was 96%, with the lowest being 88%. Time spent below 89% saturation equaled 0 minutes. PERIODIC LIMB MOVEMENTS: The patient had a total of 43 Periodic Limb Movements.  The Periodic Limb Movement (PLM) Arousal index was 0 /hour.  Snoring was noted. EKG was highly irregular.   TITRATION STUDY WITH PAP RESULTS:   CPAP was initiated under a ResMed F20FFM in medium size- he likes his quattro mask better and insisted on using that instead- beginning at 5 cmH20 and the patient could not initiate sleep - he needed BiPAP, beginning at 13/9 cm water and explored pressures up to 17/12 cm water. This last explored BiPAP setting allowed for an AHI of 0.0 over a 108 minutes sleep time. There was REM sleep seen on the left.   Total recording time (TRT) was 341.5 minutes, with a total sleep time (TST) of 271 minutes. The patient's sleep latency was 17 minutes. REM latency was 49.5 minutes.  The sleep efficiency was 79.4 %.    SLEEP ARCHITECTURE: Wake after sleep was 52 minutes, Stage N1 65 minutes, Stage N2 158 minutes, Stage N3 0 minutes and Stage R (REM sleep) 48 minutes. The percentages were: Stage N1 24.%, Stage N2 58.3%, Stage N3 0% and Stage R (REM sleep) 17.7%.   RESPIRATORY ANALYSIS:  There were a total of 26 respiratory events: 17 obstructive apneas, 0 central apneas and 0 mixed apneas with a total of 17 apneas and an apnea index (AI) of 3.8. There were 9 hypopneas with a hypopnea index of 2. /hour. The patient also had 0 respiratory event related arousals (RERAs).      The total APNEA/HYPOPNEA INDEX (AHI) was 5.8 /hour.  2 events occurred in REM sleep and 24 events in NREM. The REM AHI was 2.5 /hour versus a non-REM AHI of 6.5 /hour. The patient spent 0% of total sleep time in the supine position. The supine AHI was 0.0 /hour, versus a non-supine AHI of 5.8/hour.  OXYGEN SATURATION & C02:  The wake baseline 02 saturation was 95%, with the  lowest being 85%. Time spent below 89% saturation equaled 2 minutes.  The arousals were noted as: 15 were spontaneous, 0 were associated with PLMs, 2 were associated with respiratory events. The patient had a total of 69 Periodic Limb Movements but none let to arousals. PLMs extended into REM sleep.    POLYSOMNOGRAPHY IMPRESSION :   1. Obstructive Sleep Apnea(OSA) controlled under BiPAP PRESSURE of 17/12 cm water. FFM medium size Quattro.   2. Periodic Limb Movement in REM sleep.  3. Very irregular EKG pattern -single channel.  4. Dependent on PAP therapy, the patient was unable to initiate sleep without PAP therapy.    RECOMMENDATIONS: I will order a new BiPAP device at the above named setting.   A follow up appointment will be scheduled in the Sleep Clinic at [facility].     I certify that I  have reviewed the entire raw data recording prior to the issuance of this report in accordance with the Standards of Accreditation of the American Academy of Sleep Medicine (AASM)    Larey Seat, M.D. Diplomat, Tax adviser of Psychiatry and Neurology  Diplomat, Tax adviser of Sleep Medicine Market researcher, [employer] [facility] at [employer]

## 2020-08-13 NOTE — Addendum Note (Signed)
Addended by: Larey Seat on: 08/13/2020 05:03 PM   Modules accepted: Orders

## 2020-08-14 ENCOUNTER — Telehealth: Payer: Self-pay | Admitting: Neurology

## 2020-08-14 NOTE — Telephone Encounter (Signed)
-----   Message from Larey Seat, MD sent at 08/13/2020  5:03 PM EDT ----- POLYSOMNOGRAPHY IMPRESSION:   1. Obstructive Sleep Apnea(OSA) controlled under BiPAP PRESSURE of 17/12 cm water. FFM medium size Quattro.   2. Periodic Limb Movement in REM sleep.  3. Very irregular EKG pattern -single channel.  4. Dependent on PAP therapy, the patient was unable to initiate sleep without PAP therapy.    RECOMMENDATIONS: I will URGENTLY order a new BiPAP device at the above named setting.   A follow up appointment will be scheduled in the Sleep Clinic at [facility].

## 2020-08-14 NOTE — Telephone Encounter (Signed)
I called pt. I advised pt that Dr. Brett Fairy reviewed their sleep study results and found that pt was best treated with BiPAP. Dr. Brett Fairy recommends that pt starts BiPAP 17/12 cm water pressure. I reviewed PAP compliance expectations with the pt. Pt is agreeable to starting a CPAP. I advised pt that an order will be sent to a DME, [facility] and [facility] ([place]) will call the pt within about one week after they file with the pt's insurance. [facility]) will show the pt how to use the machine, fit for masks, and troubleshoot the CPAP if needed. A follow up appt was made for insurance purposes with Ward Givens, NP on 12/05/2020 at 11 am. Pt verbalized understanding to arrive 15 minutes early and bring their CPAP. A letter with all of this information in it will be mailed to the pt as a reminder. I verified with the pt that the address we have on file is correct. Pt verbalized understanding of results. Pt had no questions at this time but was encouraged to call back if questions arise. I have sent the order to [facility])  and have received confirmation that they have received the order.  D

## 2020-08-15 ENCOUNTER — Ambulatory Visit (INDEPENDENT_AMBULATORY_CARE_PROVIDER_SITE_OTHER): Payer: Medicare Other | Admitting: *Deleted

## 2020-08-15 ENCOUNTER — Ambulatory Visit (HOSPITAL_COMMUNITY): Payer: Medicare Other | Attending: Cardiology

## 2020-08-15 ENCOUNTER — Ambulatory Visit (INDEPENDENT_AMBULATORY_CARE_PROVIDER_SITE_OTHER): Payer: Medicare Other | Admitting: Cardiovascular Disease

## 2020-08-15 ENCOUNTER — Encounter: Payer: Self-pay | Admitting: Cardiovascular Disease

## 2020-08-15 ENCOUNTER — Other Ambulatory Visit: Payer: Self-pay

## 2020-08-15 VITALS — BP 126/70 | HR 78 | Ht 66.0 in | Wt 215.0 lb

## 2020-08-15 DIAGNOSIS — Z5181 Encounter for therapeutic drug level monitoring: Secondary | ICD-10-CM | POA: Diagnosis not present

## 2020-08-15 DIAGNOSIS — I4819 Other persistent atrial fibrillation: Secondary | ICD-10-CM

## 2020-08-15 DIAGNOSIS — I350 Nonrheumatic aortic (valve) stenosis: Secondary | ICD-10-CM

## 2020-08-15 DIAGNOSIS — I2510 Atherosclerotic heart disease of native coronary artery without angina pectoris: Secondary | ICD-10-CM | POA: Diagnosis not present

## 2020-08-15 DIAGNOSIS — N184 Chronic kidney disease, stage 4 (severe): Secondary | ICD-10-CM | POA: Diagnosis not present

## 2020-08-15 LAB — ECHOCARDIOGRAM COMPLETE
AR max vel: 0.99 cm2
AV Area VTI: 1.03 cm2
AV Area mean vel: 0.98 cm2
AV Mean grad: 21.5 mmHg
AV Peak grad: 35.9 mmHg
Ao pk vel: 3 m/s
Area-P 1/2: 4.68 cm2
MV VTI: 1.33 cm2
S' Lateral: 2.9 cm

## 2020-08-15 LAB — POCT INR: INR: 1.9 — AB (ref 2.0–3.0)

## 2020-08-15 NOTE — Patient Instructions (Addendum)
Medication Instructions:  1) STOP LISINOPRIL *If you need a refill on your cardiac medications before your next appointment, please call your pharmacy*  Testing/Procedures: Your provider has requested that you have an echocardiogram in 1 year. Echocardiography is a painless test that uses sound waves to create images of your heart. It provides your doctor with information about the size and shape of your heart and how well your heart's chambers and valves are working. This procedure takes approximately one hour. There are no restrictions for this procedure.  Follow-Up: At [facility], you and your health needs are our priority.  As part of our continuing mission to provide you with exceptional heart care, we have created designated Provider Care Teams.  These Care Teams include your primary Cardiologist (physician) and Advanced Practice Providers (APPs -  Physician Assistants and Nurse Practitioners) who all work together to provide you with the care you need, when you need it. Your next appointment:   12 month(s) The format for your next appointment:   In Person Provider:   You may see Sherren Mocha, MD or one of the following Advanced Practice Providers on your designated Care Team:    Richardson Dopp, PA-C  [place], [place]

## 2020-08-15 NOTE — Progress Notes (Signed)
Cardiology Office Note:    Date:  08/15/2020   ID:  [name], DOB 12-23-1930, MRN [id number]  PCP:  Tonia Ghent, MD   [facility] Cardiologist:  Sherren Mocha, MD     Referring MD: Tonia Ghent, MD   Chief Complaint  Patient presents with  . Coronary Artery Disease    History of Present Illness:    [name] is a 87 y.o. male with a hx of coronary artery disease and permanent atrial fibrillation.  He presents for follow-up evaluation today.  He is here alone today. He feels 'wobbly' when he stands up. States that he has become unsteady on his feet. Otherwise doing ok and denies chest pain or pressure, edema, orthopnea, or PND. He has mild chronic dyspnea, especially when bending over. He complains of numbness in his fingers.   Past Medical History:  Diagnosis Date  . Bradycardia    a. nocturnal with pauses overnight on tele likely due to OSA 10/2015.  [name] Cancer ([place])    skin cancer  . CKD (chronic kidney disease), stage III ([place])   . Coronary artery disease    a. prev nonobst. b. LHC 10/24/15: occlusion of small diffusely diseased OM2 s/p balloon angioplasty, mild nonobstructive disease of mLAD and mRCA, normal LVEDP  . Diabetes mellitus without complication ([place])   . Diabetic peripheral neuropathy ([place]) 10/04/2019  . Diverticulosis   . Esophageal reflux   . Esophageal stricture from GERD 06/08/2011   With stricture at GE junction on EGD, dilated 06/2011   . Gastric ulcer    on EGD 2013  . Gout, unspecified    "on daily RX" (09/09/2017)  . History of blood transfusion 02/1964   "when I had right little finger cut off"  . Hyperlipidemia   . Hypertension   . Obesity, unspecified   . Orthopnea   . OSA on CPAP   . Paroxysmal nocturnal dyspnea   . Persistent atrial fibrillation ([place])   . Vertigo     Past Surgical History:  Procedure Laterality Date  . AMPUTATION Right 03/03/2019   Procedure: RIGHT 2ND TOE AMPUTATION;  Surgeon: [name], MD;  Location: [place];  Service: Orthopedics;  Laterality: Right;  . BALLOON DILATION N/A 10/22/2014   Procedure: BALLOON DILATION;  Surgeon: [name], MD;  Location: [facility];  Service: Endoscopy;  Laterality: N/A;  . CARDIAC CATHETERIZATION  07/2008  . CARDIAC CATHETERIZATION N/A 10/24/2015   Procedure: Left Heart Cath and Coronary Angiography;  Surgeon: [name], MD;  Location: [facility];  Service: Cardiovascular;  Laterality: N/A;  . CARDIAC CATHETERIZATION N/A 10/24/2015   Procedure: Coronary Balloon Angioplasty;  Surgeon: [name], MD;  Location: [facility];  Service: Cardiovascular;  Laterality: N/A;  . Carotid Dopplers  09/2009   no sig extracranial stenosis and vertebral arteries had antegrade flow  . CATARACT EXTRACTION W/ INTRAOCULAR LENS  IMPLANT, BILATERAL Bilateral   . ESOPHAGOGASTRODUODENOSCOPY (EGD) WITH ESOPHAGEAL DILATION     "I've had it stretched 3 times" (10/23/2015)  . ESOPHAGOGASTRODUODENOSCOPY (EGD) WITH ESOPHAGEAL DILATION  06/2011   /medical hx above  (09/09/2017)  . ESOPHAGOGASTRODUODENOSCOPY (EGD) WITH PROPOFOL N/A 10/22/2014   Procedure: ESOPHAGOGASTRODUODENOSCOPY (EGD) WITH PROPOFOL;  Surgeon: [name], MD;  Location: [facility];  Service: Endoscopy;  Laterality: N/A;  . FINGER SURGERY Right 02/1964   "cut little finger off; had it reattached"  . I & D EXTREMITY Right 09/10/2017   Procedure: RIGHT  FOOT DEBRIDEMENT;  Surgeon: [name], MD;  Location: [address];  Service: Orthopedics;  Laterality: Right;  . MRI of brain  09/2009   chronic microvascular ischemia  . OSA Sleep Study     per Dr. Brett Fairy  . SHOULDER ARTHROSCOPY W/ ROTATOR CUFF REPAIR Right 2008    Current Medications: Current Meds  Medication Sig  . acetaminophen (TYLENOL) 500 MG tablet Take 1,000 mg by mouth as needed for mild pain.  [name] allopurinol (ZYLOPRIM) 100 MG tablet TAKE 1 TABLET BY MOUTH EVERY DAY  . Colchicine (MITIGARE) 0.6 MG CAPS Take  1 tablet by mouth 2 (two) times daily as needed (Mitigare).  [name] diltiazem (CARDIZEM CD) 180 MG 24 hr capsule TAKE 1 CAPSULE BY MOUTH EVERY DAY  . furosemide (LASIX) 20 MG tablet TAKE 1 TABLET BY MOUTH DAILY. PLEASE MAKE YEARLY APPT WITH DR. Burt Knack FOR MAY BEFORE ANYMORE REFILLS  . Insulin Syringe-Needle U-100 (B-D INSULIN SYRINGE 1CC/25GX1") 25G X 1" 1 ML MISC Use with B12 shots  . metoprolol tartrate (LOPRESSOR) 25 MG tablet Take 25 mg by mouth 2 (two) times daily. Per patient taking 1/2 tablet 12.5 mg twice a day  . warfarin (COUMADIN) 4 MG tablet TAKE AS DIRECTED BY COUMADIN CLINIC  . [DISCONTINUED] lisinopril (ZESTRIL) 20 MG tablet Take 0.5 tablets (10 mg total) by mouth daily.     Allergies:   Penicillins, Atorvastatin, Pravastatin, Hydrocodone, Uloric [febuxostat], Vancomycin, Biaxin [clarithromycin], and Doxycycline   Social History   Socioeconomic History  . Marital status: Widowed    Spouse name: Not on file  . Number of children: 3  . Years of education: Not on file  . Highest education level: Not on file  Occupational History  . 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 Smoking status: Former Smoker    Packs/day: 0.12    Years: 4.00    Pack years: 0.48    Types: Cigarettes    Quit date: 03/16/1969    Years since quitting: 51.4  . Smokeless tobacco: Former Systems developer  . Tobacco comment: "chewed when I smoked; smoked 1 pack/week for about 4 years, quit 1971  Vaping Use  . Vaping Use: Never used  Substance and Sexual Activity  . Alcohol use: Not Currently    Alcohol/week: 0.0 standard drinks    Comment: 09/09/2017  "Quit all alcohol in 1970's"  . Drug use: Never  . Sexual activity: Not on file  Other Topics Concern  . Not on file  Social History Narrative   Lives in [place], married 1953, widowed after 26 years   Retired Administrator, still does Biomedical scientist.   He is not routinely exercising.   Daily caffeine    Social  Determinants of Health   Financial Resource Strain: Low Risk   . Difficulty of Paying Living Expenses: Not very hard  Food Insecurity: Not on file  Transportation Needs: Not on file  Physical Activity: Not on file  Stress: Not on file  Social Connections: Not on file     Family History: The patient's family history includes Dementia in his father; Heart disease in his brother; Hypertension in his father and another family member; Pneumonia in his father; Stomach cancer in his mother. There is no history of Prostate cancer or Colon cancer.  ROS:   Please see the history of present illness.    All other systems reviewed and are negative.  EKGs/Labs/Other Studies Reviewed:    The following studies  were reviewed today: Echo images from today's study reviewed:  LV function is normal. The Ao valve is calcified and moderately restricted. Peak and mean gradients are 37 and 22 mmHg, respectively. DI is 0.32.   EKG:  EKG is ordered today.  The ekg ordered today demonstrates atrial fibrillation 78 bpm, otherwise normal  Recent Labs: 07/23/2020: ALT 24; Hemoglobin 16.2; Platelets 101.0 Repeated and verified X2. 07/29/2020: BUN 31; Creatinine, Ser 2.06; Potassium 4.8; Sodium 136  Recent Lipid Panel    Component Value Date/Time   CHOL 166 05/16/2019 0923   TRIG 279.0 (H) 05/16/2019 0923   HDL 26.40 (L) 05/16/2019 0923   CHOLHDL 6 05/16/2019 0923   VLDL 55.8 (H) 05/16/2019 0923   LDLCALC 82 10/24/2015 0430   LDLDIRECT 95.0 05/16/2019 0923     Risk Assessment/Calculations:    CHA2DS2-VASc Score = 5  This indicates a 7.2% annual risk of stroke. The patient's score is based upon: CHF History: Yes HTN History: Yes Diabetes History: No Stroke History: No Vascular Disease History: Yes Age Score: 2 Gender Score: 0      Physical Exam:    VS:  BP 126/70   Pulse 78   Ht 5\' 6"  (1.676 m)   Wt 215 lb (97.5 kg)   SpO2 96%   BMI 34.70 kg/m     Wt Readings from Last 3 Encounters:   08/15/20 215 lb (97.5 kg)  07/29/20 210 lb (95.3 kg)  06/20/20 215 lb (97.5 kg)     GEN:  Well nourished, well developed obese elderly male in no acute distress HEENT: Normal NECK: No JVD; No carotid bruits LYMPHATICS: No lymphadenopathy CARDIAC: irregularly irregular, 2/6 harsh crescendo decrescendo murmur at the RUSB RESPIRATORY:  Clear to auscultation without rales, wheezing or rhonchi  ABDOMEN: Soft, non-tender, non-distended MUSCULOSKELETAL:  No edema; No deformity  SKIN: Warm and dry NEUROLOGIC:  Alert and oriented x 3 PSYCHIATRIC:  Normal affect   ASSESSMENT:    1. Coronary artery disease involving native coronary artery of native heart without angina pectoris   2. Moderate aortic stenosis   3. Persistent atrial fibrillation ([place])   4. CKD (chronic kidney disease) stage 4, GFR 15-29 ml/min (HCC)    PLAN:    In order of problems listed above:  1. The patient is doing well with no anginal symptoms on diltiazem.  He is not on antiplatelet therapy because of chronic oral anticoagulation. 2. I personally reviewed the patient's echo images today and compared them to his previous studies.  He has moderate aortic stenosis and can continue with annual follow-up.  We will repeat an echocardiogram next year at the time of his return office visit.  Echo findings outlined above with a mean transvalvular gradient of 22 mmHg. 3. Anticoagulated with warfarin.  No bleeding problems. 4. Reviewed labs, recent creatinine up to 2.2 and then decreased to 2.06 with reduction in lisinopril from 20 to 10 mg daily.  He is drinking adequate fluids.  He is not on any nephrotoxic drugs.  He has an upcoming nephrology evaluation in about 3 weeks.  Considering his dizziness, relatively low blood pressure and his advanced age, and increased creatinine, he might do better off of lisinopril at least in the short-term.  I asked him to hold this medication to see if his symptoms improve and he can have a  follow-up metabolic panel when he is seen in nephrology.  Decision can be made whether to rechallenge him or not at that time.  Medication Adjustments/Labs  and Tests Ordered: Current medicines are reviewed at length with the patient today.  Concerns regarding medicines are outlined above.  Orders Placed This Encounter  Procedures  . EKG 12-Lead   No orders of the defined types were placed in this encounter.   Patient Instructions  Medication Instructions:  1) STOP LISINOPRIL *If you need a refill on your cardiac medications before your next appointment, please call your pharmacy*  Follow-Up: At [facility], you and your health needs are our priority.  As part of our continuing mission to provide you with exceptional heart care, we have created designated Provider Care Teams.  These Care Teams include your primary Cardiologist (physician) and Advanced Practice Providers (APPs -  Physician Assistants and Nurse Practitioners) who all work together to provide you with the care you need, when you need it. Your next appointment:   12 month(s) The format for your next appointment:   In Person Provider:   You may see Sherren Mocha, MD or one of the following Advanced Practice Providers on your designated Care Team:    Richardson Dopp, PA-C  [place], [place]      Signed, Sherren Mocha, MD  08/15/2020 2:56 PM    [place]

## 2020-08-15 NOTE — Patient Instructions (Signed)
Description   Take 2 tablets of warfarin today and then Continue taking 1 tablet daily except 1.5 tablets on Sundays and Thursdays. Recheck INR 3 weeks.  Call Coumadin clinic for any changes in medications or up coming procedures. [phone].

## 2020-08-20 ENCOUNTER — Ambulatory Visit (INDEPENDENT_AMBULATORY_CARE_PROVIDER_SITE_OTHER): Payer: Medicare Other

## 2020-08-20 ENCOUNTER — Other Ambulatory Visit: Payer: Self-pay

## 2020-08-20 DIAGNOSIS — E538 Deficiency of other specified B group vitamins: Secondary | ICD-10-CM

## 2020-08-20 MED ORDER — CYANOCOBALAMIN 1000 MCG/ML IJ SOLN
1000.0000 ug | Freq: Once | INTRAMUSCULAR | Status: AC
  Administered 2020-08-20: 1000 ug via INTRAMUSCULAR

## 2020-08-20 NOTE — Progress Notes (Signed)
Patient presented for B 12 injection given by Cicily Bonano, CMA to left deltoid, patient voiced no concerns nor showed any signs of distress during injection.  

## 2020-08-21 ENCOUNTER — Ambulatory Visit: Payer: Medicare Other | Admitting: Physical Medicine and Rehabilitation

## 2020-08-22 DIAGNOSIS — U071 COVID-19: Secondary | ICD-10-CM | POA: Diagnosis not present

## 2020-08-23 ENCOUNTER — Other Ambulatory Visit: Payer: Self-pay | Admitting: Cardiovascular Disease

## 2020-08-26 ENCOUNTER — Encounter: Payer: Self-pay | Admitting: Physical Medicine and Rehabilitation

## 2020-08-26 ENCOUNTER — Ambulatory Visit: Payer: Self-pay

## 2020-08-26 ENCOUNTER — Other Ambulatory Visit: Payer: Self-pay

## 2020-08-26 ENCOUNTER — Ambulatory Visit (INDEPENDENT_AMBULATORY_CARE_PROVIDER_SITE_OTHER): Payer: Medicare Other | Admitting: Physical Medicine and Rehabilitation

## 2020-08-26 ENCOUNTER — Other Ambulatory Visit: Payer: Self-pay | Admitting: Family Medicine

## 2020-08-26 VITALS — BP 118/69 | HR 91

## 2020-08-26 DIAGNOSIS — M5416 Radiculopathy, lumbar region: Secondary | ICD-10-CM | POA: Diagnosis not present

## 2020-08-26 DIAGNOSIS — R1319 Other dysphagia: Secondary | ICD-10-CM

## 2020-08-26 DIAGNOSIS — K222 Esophageal obstruction: Secondary | ICD-10-CM

## 2020-08-26 MED ORDER — METHYLPREDNISOLONE ACETATE 80 MG/ML IJ SUSP
80.0000 mg | Freq: Once | INTRAMUSCULAR | Status: AC
Start: 2020-08-26 — End: 2020-08-26
  Administered 2020-08-26: 80 mg

## 2020-08-26 NOTE — Patient Instructions (Signed)

## 2020-08-26 NOTE — Progress Notes (Signed)
Pt state lower back pain that travels to his right hip. Pt state movement makes the pain worse. Pt state he takes over the counter pain meds.   Numeric Pain Rating Scale and Functional Assessment Average Pain 5   In the last MONTH (on 0-10 scale) has pain interfered with the following?  1. General activity like being  able to carry out your everyday physical activities such as walking, climbing stairs, carrying groceries, or moving a chair?  Rating(10)   +Driver, +BT, -Dye Allergies.

## 2020-08-26 NOTE — Progress Notes (Signed)
[name] - 87 y.o. male MRN [id number]  Date of birth: Jul 03, 1930  Office Visit Note: Visit Date: 08/26/2020 PCP: Tonia Ghent, MD Referred by: Tonia Ghent, MD  Subjective: Chief Complaint  Patient presents with   Lower Back - Pain   Right Hip - Pain   HPI:  [name] is a 87 y.o. male who comes in today at the request of Dr. Eunice Blase for planned Right L4-L5 Lumbar Transforaminal epidural steroid injection with fluoroscopic guidance.  The patient has failed conservative care including home exercise, medications, time and activity modification.  This injection will be diagnostic and hopefully therapeutic.  Please see requesting physician notes for further details and justification. MRI reviewed with images and spine model.  MRI reviewed in the note below.     ROS Otherwise per HPI.  Assessment & Plan: Visit Diagnoses:    ICD-10-CM   1. Lumbar radiculopathy  M54.16 XR C-ARM NO REPORT    Epidural Steroid injection    methylPREDNISolone acetate (DEPO-MEDROL) injection 80 mg      Plan: No additional findings.   Meds & Orders:  Meds ordered this encounter  Medications   methylPREDNISolone acetate (DEPO-MEDROL) injection 80 mg    Orders Placed This Encounter  Procedures   XR C-ARM NO REPORT   Epidural Steroid injection    Follow-up: Return if symptoms worsen or fail to improve.   Procedures: No procedures performed      Clinical History: MRI LUMBAR SPINE WITHOUT CONTRAST   TECHNIQUE: Multiplanar, multisequence MR imaging of the lumbar spine was performed. No intravenous contrast was administered.   COMPARISON:  None.   FINDINGS: Segmentation: 5 non rib-bearing lumbar type vertebral bodies are present. The lowest fully formed vertebral body is L5.   Alignment: Slight degenerative retrolisthesis is present at L1-2 and L3-4. No other significant listhesis is present. There is some straightening of the normal lumbar lordosis. Mild leftward  curvature is centered at L2-3.   Vertebrae: Edematous changes are associated with superior endplate Schmorl's nodes at L4. Mild chronic fatty endplate marrow changes are present at L5-S1, worse on the left. Marrow signal and vertebral body heights are otherwise normal.   Conus medullaris and cauda equina: Conus extends to the L1-2 level. Conus and cauda equina appear normal.   Paraspinal and other soft tissues: Limited imaging the abdomen is unremarkable. There is no significant adenopathy. No solid organ lesions are present.   Disc levels:   T12-L1: Mild disc bulging is present. No significant stenosis is present.   L1-2: Negative.   L2-3: Mild facet hypertrophy is present without significant stenosis.   L3-4: Broad-based disc protrusion is present. Mild facet hypertrophy and ligamentum flavum thickening noted. This results in mild scratched at the central canal is patent. Moderate foraminal narrowing is worse left than right.   L4-5: A broad-based disc protrusion is asymmetric to the left. Mild facet hypertrophy is noted bilaterally. Mild left subarticular narrowing is present. Moderate foraminal stenosis is worse on the left.   L5-S1: A leftward disc protrusion is present. Mild left subarticular narrowing is present. Moderate foraminal stenosis is worse on the left.   IMPRESSION: 1. Mild left subarticular narrowing at L4-5 and L5-S1. 2. Moderate foraminal stenosis bilaterally at L3-4, L4-5, and L5-S1 is worse on the left at both levels. 3. Edematous changes associated with superior endplate Schmorl's nodes at L4.     Electronically Signed   By: San Morelle M.D.   On: 08/05/2020 15:25  Objective:  VS:  HT:    WT:   BMI:     BP:118/69  HR:91bpm  TEMP: ( )  RESP:  Physical Exam Vitals and nursing note reviewed.  Constitutional:      General: He is not in acute distress.    Appearance: Normal appearance. He is obese. He is not ill-appearing.   HENT:     Head: Normocephalic and atraumatic.     Right Ear: External ear normal.     Left Ear: External ear normal.     Nose: No congestion.  Eyes:     Extraocular Movements: Extraocular movements intact.  Cardiovascular:     Rate and Rhythm: Normal rate.     Pulses: Normal pulses.  Pulmonary:     Effort: Pulmonary effort is normal. No respiratory distress.  Abdominal:     General: There is no distension.     Palpations: Abdomen is soft.  Musculoskeletal:        General: No tenderness or signs of injury.     Cervical back: Neck supple.     Right lower leg: No edema.     Left lower leg: No edema.     Comments: Patient has good distal strength without clonus.  Skin:    Findings: No erythema or rash.  Neurological:     General: No focal deficit present.     Mental Status: He is alert and oriented to person, place, and time.     Sensory: No sensory deficit.     Motor: No weakness or abnormal muscle tone.     Coordination: Coordination normal.  Psychiatric:        Mood and Affect: Mood normal.        Behavior: Behavior normal.     Imaging: No results found.

## 2020-09-03 ENCOUNTER — Telehealth: Payer: Self-pay

## 2020-09-03 NOTE — Chronic Care Management (AMB) (Addendum)
    Chronic Care Management Pharmacy Assistant   Name: [name]  MRN: [id number] DOB: [birth year]  Reason for Encounter: CCM Reminder Call  Recent office visits:  07/29/20 - Dr.Duncan labs ordered hold meloxicam for now, try zyrtec 10 mg 1 tablet at nighttime    Recent consult visits:  08/26/20 - Physical Medicine  Steroid injection lumbar spine 08/15/20 - Cardiology  labs ordered hold lisinopril due to dizziness 07/26/20 - Neurology sleep study   Hospital visits:  None in previous 6 months  Medications: Outpatient Encounter Medications as of 09/03/2020  Medication Sig   acetaminophen (TYLENOL) 500 MG tablet Take 1,000 mg by mouth as needed for mild pain.   allopurinol (ZYLOPRIM) 100 MG tablet TAKE 1 TABLET BY MOUTH EVERY DAY   Colchicine (MITIGARE) 0.6 MG CAPS Take 1 tablet by mouth 2 (two) times daily as needed (Mitigare).   diltiazem (CARDIZEM CD) 180 MG 24 hr capsule TAKE 1 CAPSULE BY MOUTH EVERY DAY   furosemide (LASIX) 20 MG tablet TAKE 1 TABLET BY MOUTH DAILY. PLEASE MAKE YEARLY APPT WITH DR. Burt Knack FOR MAY BEFORE ANYMORE REFILLS   Insulin Syringe-Needle U-100 (B-D INSULIN SYRINGE 1CC/25GX1") 25G X 1" 1 ML MISC Use with B12 shots   metoprolol tartrate (LOPRESSOR) 25 MG tablet TAKE 1 TABLET BY MOUTH TWICE A DAY   omeprazole (PRILOSEC) 40 MG capsule TAKE 1 CAPSULE BY MOUTH EVERY DAY   warfarin (COUMADIN) 4 MG tablet TAKE AS DIRECTED BY COUMADIN CLINIC   No facility-administered encounter medications on file as of 09/03/2020.    [name] was contacted to remind him of his upcoming telephone visit with Debbora Dus on 09/09/2020 at 2:00 PM. He was reminded to have all medications, supplements and any blood glucose and blood pressure readings available for review at appointment.   Are you having any problems with your medications? no   What concerns would you like to discuss with the pharmacist? no   Star Rating Drugs: Medication:  Last Fill: Day Supply None  identified  PCP appointment on 09/24/20, CCM appointment on 09/09/20, and Cardiology appointment on 09/05/20   Debbora Dus, CPP notified  Avel Sensor, [place] Assistant [phone]  I have reviewed the care management and care coordination activities outlined in this encounter and I am certifying that I agree with the content of this note. No further action required.  Debbora Dus, PharmD Clinical Pharmacist [facility] [phone]

## 2020-09-05 ENCOUNTER — Other Ambulatory Visit: Payer: Self-pay

## 2020-09-05 ENCOUNTER — Ambulatory Visit (INDEPENDENT_AMBULATORY_CARE_PROVIDER_SITE_OTHER): Payer: Medicare Other | Admitting: Pharmacist

## 2020-09-05 DIAGNOSIS — Z5181 Encounter for therapeutic drug level monitoring: Secondary | ICD-10-CM

## 2020-09-05 DIAGNOSIS — I4819 Other persistent atrial fibrillation: Secondary | ICD-10-CM

## 2020-09-05 LAB — POCT INR: INR: 2.5 (ref 2.0–3.0)

## 2020-09-05 NOTE — Patient Instructions (Signed)
Continue taking 1 tablet daily except 1.5 tablets on Sundays and Thursdays. Recheck INR 4 weeks.  Call Coumadin clinic for any changes in medications or up coming procedures. [phone].

## 2020-09-06 DIAGNOSIS — I129 Hypertensive chronic kidney disease with stage 1 through stage 4 chronic kidney disease, or unspecified chronic kidney disease: Secondary | ICD-10-CM | POA: Diagnosis not present

## 2020-09-06 DIAGNOSIS — R809 Proteinuria, unspecified: Secondary | ICD-10-CM | POA: Diagnosis not present

## 2020-09-06 DIAGNOSIS — D631 Anemia in chronic kidney disease: Secondary | ICD-10-CM | POA: Diagnosis not present

## 2020-09-06 DIAGNOSIS — N2581 Secondary hyperparathyroidism of renal origin: Secondary | ICD-10-CM | POA: Diagnosis not present

## 2020-09-06 DIAGNOSIS — N1832 Chronic kidney disease, stage 3b: Secondary | ICD-10-CM | POA: Diagnosis not present

## 2020-09-06 DIAGNOSIS — M1A00X0 Idiopathic chronic gout, unspecified site, without tophus (tophi): Secondary | ICD-10-CM | POA: Diagnosis not present

## 2020-09-06 DIAGNOSIS — N184 Chronic kidney disease, stage 4 (severe): Secondary | ICD-10-CM | POA: Diagnosis not present

## 2020-09-06 LAB — BASIC METABOLIC PANEL: Creatinine: 1.1 (ref <=1.3)

## 2020-09-09 ENCOUNTER — Other Ambulatory Visit: Payer: Self-pay

## 2020-09-09 ENCOUNTER — Ambulatory Visit (INDEPENDENT_AMBULATORY_CARE_PROVIDER_SITE_OTHER): Payer: Medicare Other

## 2020-09-09 DIAGNOSIS — E7849 Other hyperlipidemia: Secondary | ICD-10-CM | POA: Diagnosis not present

## 2020-09-09 DIAGNOSIS — Z7901 Long term (current) use of anticoagulants: Secondary | ICD-10-CM

## 2020-09-09 DIAGNOSIS — N1832 Chronic kidney disease, stage 3b: Secondary | ICD-10-CM

## 2020-09-09 DIAGNOSIS — I10 Essential (primary) hypertension: Secondary | ICD-10-CM

## 2020-09-09 NOTE — Patient Instructions (Signed)
Dear [name],  Below is a summary of the goals we discussed during our follow up appointment on September 09, 2020. Please contact me anytime with questions or concerns.   Visit Information  Patient Care Plan: CCM Pharmacy Care Plan     Problem Identified: CHL AMB "PATIENT-SPECIFIC PROBLEM"      Long-Range Goal: Patient Stated   This Visit's Progress: On track  Priority: High  Note:   Current Barriers:  Acute decline in kidney function - patient now seeing nephrology   Pharmacist Clinical Goal(s):  Patient will contact provider office for questions/concerns as evidenced notation of same in electronic health record through collaboration with PharmD and provider.   Interventions: 1:1 collaboration with Tonia Ghent, MD regarding development and update of comprehensive plan of care as evidenced by provider attestation and co-signature Inter-disciplinary care team collaboration (see longitudinal plan of care) Comprehensive medication review performed; medication list updated in electronic medical record  Hypertension (BP goal <140/90) Query controlled - patient reports some elevations, he is awaiting call back from nephrology  -Current treatment: Metoprolol tartrate 25 mg - 1 tablet twice daily Diltiazem 180 mg - 1 capsule daily -Medications previously tried: lisinopril on hold -Current home readings: none specific today but reports they are running a bit higher in AM -Denies hypotensive/hypertensive symptoms -Recommended to continue current medication  Hyperlipidemia: (LDL goal < 70- CAD) -Not ideally controlled - LDL 95 -Current treatment: None  -Medications previously tried: pravastatin, atorvastatin - myalgias -Statin intolerant per allergy list  -Recommended  lifestyle management - watch foods high in cholesterol  Diabetes (A1c goal < 7.5%) -Controlled - A1c 7.2% -Current medications: None  -Medications previously tried: none   -Current home glucose readings -  none, does not monitor -Counseled to check feet daily and get yearly eye exams - foot exam updated last month -Recommended stay up to date on diabetic eye exams as recommended   Atrial Fibrillation (Goal: prevent stroke and major bleeding) -Followed by Dr. Burt Knack, cardiology  -Current treatment: Rate control: Metoprolol tartrate 25 mg - 1 tablet twice daily  Anticoagulation: Warfarin 4 mg - as directed -He avoids all NSAIDs. -Recommended to continue current medication  Heart Failure (Goal: manage symptoms and prevent exacerbations) -Controlled - denies symptoms  -Diagnosed Dr. Ellyn Hack 10/2015, started on Lasix at that time  -Last ejection fraction: Normal (2021) -HF type: Diastolic -Current treatment: Furosemide 20 mg - 1 tablet daily  -Medications previously tried: none -Educated on Importance of weighing daily; if you gain more than 3 pounds in one day or 5 pounds in one week, call the office -Recommended to continue current medication  Gout (Goal: Prevent flare up, uric acid levels within normal limit) Controlled - denies recent flare ups No updates/changes 09/09/20 Uric acid levels WNL  -Current treatment: Urate Lowering therapy: Allopurinol 100 mg - 1 tablet daily Acute therapy: Cholchicine 0.6 mg - 1 tablet twice daily at first onset of a flare until resolved  -Medications previously tried: Uloric - ineffective -Recommended to continue current medication  GERD (Goal: Control symptoms) Controlled - per patient report -Current treatment: Omeprazole 40 mg - 1 daily  -Medications previously tried: None reported Pt affirms still takes Prilosec daily without issues  -Recommended to continue current medication  Other Tylenol PRN Equate Zyrtec daily  Pataday once a day  Denies other vitamins or supplements  Patient Goals/Self-Care Activities Patient will:  - continue to follow up with nephrology and health care team as recommended  Follow Up Plan: Telephone  follow up  appointment with care management team member scheduled for: 6 months      The patient verbalized understanding of instructions, educational materials, and care plan provided today and declined offer to receive copy of patient instructions, educational materials, and care plan.   [name], PharmD Clinical Pharmacist [facility] at [facility] [phone]

## 2020-09-09 NOTE — Progress Notes (Signed)
Chronic Care Management Pharmacy Note  09/09/2020 Name:  DAXTER PAULE MRN:  [id number] DOB:  May 20, 1930   Summary: No medication concerns identified today. He has established with nephrology and is awaiting call back from their office with lab results. They are following his BP as well.  Recommendations: None  Follow up:  CCM follow up 6 months or as needed  Subjective: [name] is an 87 y.o. year old male who is a primary patient of Damita Dunnings, Elveria Rising, MD.  The CCM team was consulted for assistance with disease management and care coordination needs.    Engaged with patient by telephone for follow up visit in response to provider referral for pharmacy case management and/or care coordination services.   Patient update 09/09/20: He saw kidney specialist this past Friday, 09/06/20 Candiss Norse). Labs done and awaiting call back with results. Reports Dr. Candiss Norse will update cardio and PCP. F/u scheduled with nephology for 3 months. Patient remains off lisinopril. Reports BP is a little high in AM but comes down later in the day. Nephrology aware and working on this. Denies other health concerns.   Consent to Services:  The patient was given information about Chronic Care Management services, agreed to services, and gave verbal consent prior to initiation of services.  Please see initial visit note for detailed documentation.   Patient Care Team: Tonia Ghent, MD as PCP - Cyndia Diver, MD as PCP - Cardiology (Cardiology) Sherren Mocha, MD (Cardiology) Elsie Saas, MD as Consulting Physician (Orthopedic Surgery) Debbora Dus, [facility] as Pharmacist (Pharmacist) Gean Quint, MD as Consulting Physician (Nephrology)  Recent office visits:  07/29/20 - PCP -  Labs ordered hold meloxicam for now, try zyrtec 10 mg 1 tablet at nighttime. Reduce lisinopril to 10 mg daily. Referral to nephrology. Recheck kidney function in about 10 days.  11/23/19 - PCP - Gout f/u - Continue  allopurinol 100 mg daily, colchicine. Schedule B12 appointment.  09/11/19 - PCP/AWV - Labs   Recent consult visits:  08/26/20 - Physical Medicine - Steroid injection lumbar spine 08/15/20 - INR 1.9, Take 2 tablets of warfarin today and then Continue taking 1 tablet daily except1.5 tablets on Sundays and Thursdays. Recheck INR 3 weeks.  08/15/20 - Cardiology - Stop lisinopril for now due to CKD, dizziness  07/26/20 - Neurology sleep study   07/23/20 - Ortho - Lower back pain, UA ordered. MRI ordered. Baclofen and meloxicam as needed.  07/03/20- INR monitoring - Continue taking 1 tablet daily except 1.5 tablets on Sundays and Thursdays. Recheck INR 6 weeks  06/20/20- Dr. Asencion Partridge Dohmeier- Neurology- BiPAP, ordered Split night study. No changes. [facility] Sinus[facility]. No Additional data available. 07/28/19 - Dr. Burt Knack, Cardiology - Chief complaint SOB, recheck Echo, AFIB continue warfarin, HR elevated, increased metoprolol to 25 mg twice daily, remain on diltiazem 180 mg daily. RTC 1 year   Hospital visits: None in previous 6 months  Objective:  Lab Results  Component Value Date   CREATININE 2.06 (H) 07/29/2020   BUN 31 (H) 07/29/2020   GFR 28.41 (L) 07/29/2020   GFRNONAA 47 (L) 03/03/2019   GFRAA 55 (L) 03/03/2019   NA 136 07/29/2020   K 4.8 07/29/2020   CALCIUM 9.2 07/29/2020   CO2 28 07/29/2020   GLUCOSE 114 (H) 07/29/2020    Lab Results  Component Value Date/Time   HGBA1C 7.2 (H) 07/23/2020 08:19 AM   HGBA1C 7.7 (H) 09/11/2019 02:42 PM   GFR  28.41 (L) 07/29/2020 03:25 PM   GFR 26.26 (L) 07/23/2020 08:19 AM    Last diabetic Eye exam:  Lab Results  Component Value Date/Time   HMDIABEYEEXA No Retinopathy 05/26/2019 12:00 AM    Last diabetic Foot exam: 07/29/20 normal   Lab Results  Component Value Date   CHOL 166 05/16/2019   HDL 26.40 (L) 05/16/2019   LDLCALC 82 10/24/2015   LDLDIRECT 95.0 05/16/2019   TRIG 279.0 (H) 05/16/2019    CHOLHDL 6 05/16/2019    Hepatic Function Latest Ref Rng & Units 07/23/2020 09/07/2019 05/16/2019  Total Protein 6.0 - 8.3 g/dL 7.4 7.1 7.4  Albumin 3.5 - 5.2 g/dL 4.2 - 4.2  AST 0 - 37 U/L 21 - 26  ALT 0 - 53 U/L 24 - 24  Alk Phosphatase 39 - 117 U/L 95 - 97  Total Bilirubin 0.2 - 1.2 mg/dL 0.6 - 0.7  Bilirubin, Direct 0.0 - 0.3 mg/dL - - -     CHA2DS2-VASc Score = 5  Echo 08/2019 - EF Normal   Lab Results  Component Value Date/Time   TSH 2.33 05/16/2019 09:23 AM   TSH 1.56 02/13/2016 02:19 PM    CBC Latest Ref Rng & Units 07/23/2020 09/11/2019 05/16/2019  WBC 4.0 - 10.5 K/uL 8.1 9.2 6.1  Hemoglobin 13.0 - 17.0 g/dL 16.2 15.7 15.8  Hematocrit 39.0 - 52.0 % 49.3 46.7 46.4  Platelets 150.0 - 400.0 K/uL 101.0 Repeated and verified X2.(L) 126.0 Repeated and verified X2.(L) 106.0(L)    No results found for: VD25OH  Clinical ASCVD: Yes  The ASCVD Risk score Mikey Bussing DC Jr., et al., 2013) failed to calculate for the following reasons:   The 2013 ASCVD risk score is only valid for ages 70 to 23    Depression screen PHQ 2/9 05/16/2019 03/30/2019 02/13/2016  Decreased Interest 0 0 0  Down, Depressed, Hopeless 0 0 0  PHQ - 2 Score 0 0 0  Altered sleeping 0 - -  Tired, decreased energy 0 - -  Change in appetite 0 - -  Feeling bad or failure about yourself  0 - -  Trouble concentrating 0 - -  Moving slowly or fidgety/restless 0 - -  Suicidal thoughts 0 - -  PHQ-9 Score 0 - -  Difficult doing work/chores Not difficult at all - -  Some recent data might be hidden    Social History   Tobacco Use  Smoking Status Former   Packs/day: 0.12   Years: 4.00   Pack years: 0.48   Types: Cigarettes   Quit date: 03/16/1969   Years since quitting: 51.5  Smokeless Tobacco Former  Tobacco Comments   "chewed when I smoked; smoked 1 pack/week for about 4 years, quit 1971   BP Readings from Last 3 Encounters:  08/26/20 118/69  08/15/20 126/70  07/29/20 112/62   Pulse Readings from Last 3  Encounters:  08/26/20 91  08/15/20 78  07/29/20 85   Wt Readings from Last 3 Encounters:  08/15/20 215 lb (97.5 kg)  07/29/20 210 lb (95.3 kg)  06/20/20 215 lb (97.5 kg)   BMI Readings from Last 3 Encounters:  08/15/20 34.70 kg/m  07/29/20 33.89 kg/m  06/20/20 34.70 kg/m    Assessment/Interventions: Review of patient past medical history, allergies, medications, health status, including review of consultants reports, laboratory and other test data, was performed as part of comprehensive evaluation and provision of chronic care management services.   SDOH:  (Social Determinants of Health) assessments and interventions  performed: Yes- reviewed last month, denied cost concerns  SDOH Screenings   Alcohol Screen: Not on file  Depression (PHQ2-9): Not on file  Financial Resource Strain: Low Risk    Difficulty of Paying Living Expenses: Not very hard  Food Insecurity: Not on file  Housing: Not on file  Physical Activity: Not on file  Social Connections: Not on file  Stress: Not on file  Tobacco Use: Medium Risk   Smoking Tobacco Use: Former   Smokeless Tobacco Use: Former  Transport planner Needs: Not on file    [place]  Allergies  Allergen Reactions   Penicillins Shortness Of Breath and Swelling    Patient tolerated Rocephin injection 2019  PATIENT HAS HAD A PCN REACTION WITH IMMEDIATE RASH, FACIAL/TONGUE/THROAT SWELLING, SOB, OR LIGHTHEADEDNESS WITH HYPOTENSION:  #  #  YES  #  Has patient had a PCN reaction causing severe rash involving mucus membranes or skin necrosis: NO Has patient had a PCN reaction that required hospitalization NO Has patient had a PCN reaction occurring within the last 10 years: NO If all of the above answers are "NO", then may proceed with Cephalosporin use.    Atorvastatin Other (See Comments)    No energy, just felt bad     Pravastatin Other (See Comments)    myalgias   Hydrocodone Other (See Comments)    "swimmy headed" after use    Uloric [Febuxostat] Other (See Comments)    Would avoid, worsening gout sx with use.    Vancomycin Other (See Comments)    Red man syndrome- tolerated with slow run and benadryl use   Biaxin [Clarithromycin] Diarrhea   Doxycycline Other (See Comments)    GI upset    Medications Reviewed Today     Reviewed by Beckie Salts, University (Registered Medical Assistant) on 08/26/20 at 1303  Med List Status: <None>   Medication Order Taking? Sig Documenting Provider Last Dose Status Informant  acetaminophen (TYLENOL) 500 MG tablet [id number] Yes Take 1,000 mg by mouth as needed for mild pain. [provider] Taking Active Self  allopurinol (ZYLOPRIM) 100 MG tablet [id number] Yes TAKE 1 TABLET BY MOUTH EVERY DAY Tonia Ghent, MD Taking Active            Med Note Earna Coder Jul 29, 2020  2:59 PM)    Colchicine (MITIGARE) 0.6 MG CAPS [id number] Yes Take 1 tablet by mouth 2 (two) times daily as needed (Mitigare). Tonia Ghent, MD Taking Active            Med Note Earna Coder Jul 29, 2020  2:59 PM)    diltiazem [facility] CD) 180 MG 24 hr capsule [id number] Yes TAKE 1 CAPSULE BY MOUTH EVERY Lacie Scotts, MD Taking Active   furosemide (LASIX) 20 MG tablet [id number] Yes TAKE 1 TABLET BY MOUTH DAILY. PLEASE MAKE YEARLY APPT WITH DR. Burt Knack FOR MAY BEFORE ANYMORE REFILLS Sherren Mocha, MD Taking Active   Insulin Syringe-Needle U-100 (B-D INSULIN SYRINGE 1CC/25GX1") 25G X 1" 1 ML MISC [id number] Yes Use with B12 shots Tonia Ghent, MD Taking Active   metoprolol tartrate (LOPRESSOR) 25 MG tablet [id number] Yes TAKE 1 TABLET BY MOUTH TWICE A Lacie Scotts, MD Taking Active   warfarin (COUMADIN) 4 MG tablet [id number] Yes TAKE AS DIRECTED BY COUMADIN CLINIC Sherren Mocha, MD Taking Active             Patient Active Problem List   Diagnosis  Date Noted   Creatinine elevation 07/31/2020   Drug-induced myopathy 07/31/2020   Diabetic polyneuropathy  associated with type 2 diabetes mellitus ([place]) 06/20/2020   Gouty arthritis of left foot 06/20/2020   Diabetic peripheral neuropathy ([place]) 10/04/2019   Healthcare maintenance 05/22/2019   B12 deficiency 05/17/2019   Idiopathic chronic gout, unspecified site, without tophus (tophi) 78/29/5621   Acute diastolic heart failure (HCC)    CAD S/P percutaneous coronary angioplasty    Chronic atrial fibrillation (HCC)    CKD (chronic kidney disease), stage III ([place])    Hypertension    Hyperlipidemia    Back pain 05/24/2015   Dysphagia, pharyngoesophageal phase 09/25/2014   History of esophageal stricture 09/25/2014   Chronic anticoagulation 09/25/2014   Esophageal stricture from GERD 06/08/2011   Advance care planning 04/20/2011   OSA on CPAP 09/22/2010   Gout 08/15/2008   OBESITY 08/15/2008   Essential hypertension 08/15/2008   CAD (coronary artery disease), native coronary artery 08/15/2008   GASTROESOPHAGEAL REFLUX DISEASE 08/15/2008    Immunization History  Administered Date(s) Administered   Influenza, High Dose Seasonal PF 12/15/2016, 12/19/2018   Influenza, Seasonal, Injecte, Preservative Fre 12/14/2013   Influenza,inj,Quad PF,6+ Mos 12/17/2014, 01/29/2018   Influenza-Unspecified 12/15/2016, 12/19/2018   Moderna Sars-Covid-2 Vaccination 04/11/2019, 05/09/2019   Pneumococcal Conjugate-13 12/17/2014   Pneumococcal Polysaccharide-23 09/19/2009   Td 09/19/2009   Zoster, Live 09/19/2009    Conditions to be addressed/monitored:  Hypertension, Hyperlipidemia, Diabetes, and Chronic Kidney Disease  Care Plan : [place]  Updates made by Debbora Dus, [place] since 09/09/2020 12:00 AM     Problem: CHL AMB "PATIENT-SPECIFIC PROBLEM"      Long-Range Goal: Patient Stated   This Visit's Progress: On track  Priority: High  Note:   Current Barriers:  Acute decline in kidney function - patient now seeing nephrology   Pharmacist Clinical Goal(s):  Patient will contact  provider office for questions/concerns as evidenced notation of same in electronic health record through collaboration with PharmD and provider.   Interventions: 1:1 collaboration with Tonia Ghent, MD regarding development and update of comprehensive plan of care as evidenced by provider attestation and co-signature Inter-disciplinary care team collaboration (see longitudinal plan of care) Comprehensive medication review performed; medication list updated in electronic medical record  Hypertension (BP goal <140/90) Query controlled - patient reports some elevations, he is awaiting call back from nephrology  -Current treatment: Metoprolol tartrate 25 mg - 1 tablet twice daily Diltiazem 180 mg - 1 capsule daily -Medications previously tried: lisinopril on hold -Current home readings: none specific today but reports they are running a bit higher in AM -Denies hypotensive/hypertensive symptoms -Recommended to continue current medication  Hyperlipidemia: (LDL goal < 70- CAD) -Not ideally controlled - LDL 95 -Current treatment: None  -Medications previously tried: pravastatin, atorvastatin - myalgias -Statin intolerant per allergy list  -Recommended  lifestyle management - watch foods high in cholesterol  Diabetes (A1c goal < 7.5%) -Controlled - A1c 7.2% -Current medications: None  -Medications previously tried: none   -Current home glucose readings - none, does not monitor -Counseled to check feet daily and get yearly eye exams - foot exam updated last month -Recommended stay up to date on diabetic eye exams as recommended   Atrial Fibrillation (Goal: prevent stroke and major bleeding) -Followed by Dr. Burt Knack, cardiology  -Current treatment: Rate control: Metoprolol tartrate 25 mg - 1 tablet twice daily  Anticoagulation: Warfarin 4 mg - as directed -He avoids all NSAIDs. -Recommended to continue current  medication  Heart Failure (Goal: manage symptoms and prevent  exacerbations) -Controlled - denies symptoms  -Diagnosed Dr. Ellyn Hack 10/2015, started on Lasix at that time  -Last ejection fraction: Normal (2021) -HF type: Diastolic -Current treatment: Furosemide 20 mg - 1 tablet daily  -Medications previously tried: none -Educated on Importance of weighing daily; if you gain more than 3 pounds in one day or 5 pounds in one week, call the office -Recommended to continue current medication  Gout (Goal: Prevent flare up, uric acid levels within normal limit) Controlled - denies recent flare ups No updates/changes 09/09/20 Uric acid levels WNL  -Current treatment: Urate Lowering therapy: Allopurinol 100 mg - 1 tablet daily Acute therapy: Cholchicine 0.6 mg - 1 tablet twice daily at first onset of a flare until resolved  -Medications previously tried: Uloric - ineffective -Recommended to continue current medication  GERD (Goal: Control symptoms) Controlled - per patient report -Current treatment: Omeprazole 40 mg - 1 daily  -Medications previously tried: None reported Pt affirms still takes Prilosec daily without issues  -Recommended to continue current medication  Other Tylenol PRN Equate Zyrtec daily  Pataday once a day  Denies other vitamins or supplements  Patient Goals/Self-Care Activities Patient will:  - continue to follow up with nephrology and health care team as recommended  Follow Up Plan: Telephone follow up appointment with care management team member scheduled for: 6 months      Medication Assistance: None required.  Patient affirms current coverage meets needs.  Star Rating Drugs:  Medication:                Last Fill:         Day Supply None  Patient's preferred pharmacy is:  [id number] [name] [address]Phone: [phone]: [phone] Current pharmacy is preferred with insurance.  Care Plan and Follow Up  Patient Decision:  Patient agrees to Care Plan and Follow-up.  MDebbora Dus PharmD Clinical Pharmacist [phone]

## 2020-09-13 ENCOUNTER — Other Ambulatory Visit: Payer: Self-pay | Admitting: Nephrology

## 2020-09-13 ENCOUNTER — Encounter: Payer: Self-pay | Admitting: Family Medicine

## 2020-09-13 ENCOUNTER — Ambulatory Visit (INDEPENDENT_AMBULATORY_CARE_PROVIDER_SITE_OTHER): Payer: Medicare Other | Admitting: Family Medicine

## 2020-09-13 ENCOUNTER — Other Ambulatory Visit: Payer: Self-pay

## 2020-09-13 ENCOUNTER — Telehealth: Payer: Self-pay | Admitting: Physical Medicine and Rehabilitation

## 2020-09-13 DIAGNOSIS — R399 Unspecified symptoms and signs involving the genitourinary system: Secondary | ICD-10-CM | POA: Diagnosis not present

## 2020-09-13 DIAGNOSIS — M549 Dorsalgia, unspecified: Secondary | ICD-10-CM

## 2020-09-13 DIAGNOSIS — I2510 Atherosclerotic heart disease of native coronary artery without angina pectoris: Secondary | ICD-10-CM | POA: Diagnosis not present

## 2020-09-13 DIAGNOSIS — IMO0002 Reserved for concepts with insufficient information to code with codable children: Secondary | ICD-10-CM

## 2020-09-13 DIAGNOSIS — R7989 Other specified abnormal findings of blood chemistry: Secondary | ICD-10-CM | POA: Diagnosis not present

## 2020-09-13 DIAGNOSIS — H6982 Other specified disorders of Eustachian tube, left ear: Secondary | ICD-10-CM | POA: Diagnosis not present

## 2020-09-13 DIAGNOSIS — H6992 Unspecified Eustachian tube disorder, left ear: Secondary | ICD-10-CM

## 2020-09-13 DIAGNOSIS — N184 Chronic kidney disease, stage 4 (severe): Secondary | ICD-10-CM

## 2020-09-13 MED ORDER — LISINOPRIL 10 MG PO TABS: 10.0000 mg | ORAL_TABLET | Freq: Every day | ORAL | Status: DC

## 2020-09-13 MED ORDER — FLUTICASONE PROPIONATE 50 MCG/ACT NA SUSP: 2.0000 | Freq: Every day | NASAL | Status: DC

## 2020-09-13 MED ORDER — TAMSULOSIN HCL 0.4 MG PO CAPS: 0.4000 mg | ORAL_CAPSULE | Freq: Every day | ORAL | 3 refills | Status: DC

## 2020-09-13 MED ORDER — CYANOCOBALAMIN 1000 MCG/ML IJ SOLN: INTRAMUSCULAR | Status: AC

## 2020-09-13 NOTE — Progress Notes (Signed)
This visit occurred during the SARS-CoV-2 public health emergency.  Safety protocols were in place, including screening questions prior to the visit, additional usage of staff PPE, and extensive cleaning of exam room while observing appropriate contact time as indicated for disinfecting solutions.  He saw kidney clinic last week.  Awaiting consult note.  Cr improved off lisinopril, now on lower dose lisinopril.  He has renal f/u pending.  Not yet time to recheck labs, d/w pt.  D/w pt about avoiding nsaids.   He had prev back injection. LUTS, noted trouble with urine stream starting and stopping, not a new issue but worse recently. No fevers, no chills.    Hearing loss d/w pt.  He notes variable hearing blowing his nose, ie valsalva.  Runny nose, watery eyes.    No recent gout flares, d/w pt.  Still on allopurinol 100mg  a day.  Compliant.  R lower back pain.  Clearly better after prev injection, then returned a few days later.    Meds, vitals, and allergies reviewed.   ROS: Per HPI unless specifically indicated in ROS section   GEN: nad, alert and oriented HEENT: Small amount of cerumen in bilateral canals., NCAT NECK: supple w/o LA CV: rrr. PULM: ctab, no inc wob ABD: soft, +bs EXT: no edema SKIN: no acute rash Prostate gland firm and smooth, no enlargement, nodularity, tenderness, mass, asymmetry or induration.

## 2020-09-13 NOTE — Telephone Encounter (Signed)
error 

## 2020-09-13 NOTE — Patient Instructions (Signed)
Try taking flomax and see if your urine stream is better.   I'll await the follow up notes from the kidney clinic.  Take care.  Glad to see you. Check with the back clinic since the last injection helped.  Try using flonase daily.  See if that helps your ears and nose.

## 2020-09-14 ENCOUNTER — Other Ambulatory Visit: Payer: Self-pay | Admitting: Cardiovascular Disease

## 2020-09-14 DIAGNOSIS — I482 Chronic atrial fibrillation, unspecified: Secondary | ICD-10-CM

## 2020-09-16 DIAGNOSIS — R399 Unspecified symptoms and signs involving the genitourinary system: Secondary | ICD-10-CM | POA: Insufficient documentation

## 2020-09-16 NOTE — Assessment & Plan Note (Signed)
Per report, creatinine improved off lisinopril and back on lower dose lisinopril now.  Not due for follow-up labs yet.  Will await consult note.  Avoiding NSAIDs.

## 2020-09-16 NOTE — Assessment & Plan Note (Signed)
Chronic issue, suggestive of BPH but without prostate enlargement on DRE.  No lumps or masses noted.  Reasonable to try Flomax with routine cautions regarding getting lightheaded and update me as needed.

## 2020-09-16 NOTE — Assessment & Plan Note (Signed)
He has urinary symptoms but his rectal exam is normal.  I will defer checking a PSA at this point given his age.  Rationale discussed with patient.  He agrees.  I think his back pain is not related to his prostate and I want him to follow-up with a back clinic.

## 2020-09-16 NOTE — Assessment & Plan Note (Signed)
Reasonable to try Flonase and gently perform Valsalva and update me as needed.

## 2020-09-17 ENCOUNTER — Emergency Department (HOSPITAL_COMMUNITY)
Admission: EM | Admit: 2020-09-17 | Discharge: 2020-09-17 | Disposition: A | Discharge status: Home / Self Care | Payer: Medicare Other | Attending: Emergency Medicine | Admitting: Emergency Medicine

## 2020-09-17 ENCOUNTER — Emergency Department (HOSPITAL_COMMUNITY): Payer: Medicare Other

## 2020-09-17 ENCOUNTER — Telehealth: Payer: Self-pay

## 2020-09-17 ENCOUNTER — Encounter (HOSPITAL_COMMUNITY): Payer: Self-pay | Admitting: *Deleted

## 2020-09-17 DIAGNOSIS — R109 Unspecified abdominal pain: Secondary | ICD-10-CM

## 2020-09-17 DIAGNOSIS — I5031 Acute diastolic (congestive) heart failure: Secondary | ICD-10-CM | POA: Diagnosis not present

## 2020-09-17 DIAGNOSIS — E1122 Type 2 diabetes mellitus with diabetic chronic kidney disease: Secondary | ICD-10-CM | POA: Insufficient documentation

## 2020-09-17 DIAGNOSIS — R079 Chest pain, unspecified: Secondary | ICD-10-CM | POA: Diagnosis not present

## 2020-09-17 DIAGNOSIS — Z85828 Personal history of other malignant neoplasm of skin: Secondary | ICD-10-CM | POA: Diagnosis not present

## 2020-09-17 DIAGNOSIS — R000 Tachycardia, unspecified: Secondary | ICD-10-CM | POA: Insufficient documentation

## 2020-09-17 DIAGNOSIS — M549 Dorsalgia, unspecified: Secondary | ICD-10-CM | POA: Diagnosis not present

## 2020-09-17 DIAGNOSIS — I130 Hypertensive heart and chronic kidney disease with heart failure and stage 1 through stage 4 chronic kidney disease, or unspecified chronic kidney disease: Secondary | ICD-10-CM | POA: Insufficient documentation

## 2020-09-17 DIAGNOSIS — Z87891 Personal history of nicotine dependence: Secondary | ICD-10-CM | POA: Insufficient documentation

## 2020-09-17 DIAGNOSIS — K219 Gastro-esophageal reflux disease without esophagitis: Secondary | ICD-10-CM | POA: Diagnosis not present

## 2020-09-17 DIAGNOSIS — R1011 Right upper quadrant pain: Secondary | ICD-10-CM | POA: Diagnosis not present

## 2020-09-17 DIAGNOSIS — I2510 Atherosclerotic heart disease of native coronary artery without angina pectoris: Secondary | ICD-10-CM | POA: Insufficient documentation

## 2020-09-17 DIAGNOSIS — R1031 Right lower quadrant pain: Secondary | ICD-10-CM | POA: Insufficient documentation

## 2020-09-17 DIAGNOSIS — J8410 Pulmonary fibrosis, unspecified: Secondary | ICD-10-CM | POA: Diagnosis not present

## 2020-09-17 DIAGNOSIS — Z7901 Long term (current) use of anticoagulants: Secondary | ICD-10-CM | POA: Diagnosis not present

## 2020-09-17 DIAGNOSIS — N183 Chronic kidney disease, stage 3 unspecified: Secondary | ICD-10-CM | POA: Diagnosis not present

## 2020-09-17 DIAGNOSIS — Z79899 Other long term (current) drug therapy: Secondary | ICD-10-CM | POA: Diagnosis not present

## 2020-09-17 LAB — CBC WITH DIFFERENTIAL/PLATELET
Abs Immature Granulocytes: 0.03 10*3/uL (ref 0.00–0.07)
Basophils Absolute: 0 10*3/uL (ref 0.0–0.1)
Basophils Relative: 1 %
Eosinophils Absolute: 0.1 10*3/uL (ref 0.0–0.5)
Eosinophils Relative: 1 %
HCT: 51.3 % (ref 39.0–52.0)
Hemoglobin: 17.5 g/dL — ABNORMAL HIGH (ref 13.0–17.0)
Immature Granulocytes: 1 %
Lymphocytes Relative: 22 %
Lymphs Abs: 1.3 10*3/uL (ref 0.7–4.0)
MCH: 30.2 pg (ref 26.0–34.0)
MCHC: 34.1 g/dL (ref 30.0–36.0)
MCV: 88.6 fL (ref 80.0–100.0)
Monocytes Absolute: 0.5 10*3/uL (ref 0.1–1.0)
Monocytes Relative: 9 %
Neutro Abs: 4.1 10*3/uL (ref 1.7–7.7)
Neutrophils Relative %: 66 %
Platelets: 136 10*3/uL — ABNORMAL LOW (ref 150–400)
RBC: 5.79 MIL/uL (ref 4.22–5.81)
RDW: 14.8 % (ref 11.5–15.5)
WBC: 6 10*3/uL (ref 4.0–10.5)
nRBC: 0 % (ref 0.0–0.2)

## 2020-09-17 LAB — URINALYSIS, ROUTINE W REFLEX MICROSCOPIC
Bacteria, UA: NONE SEEN
Bilirubin Urine: NEGATIVE
Glucose, UA: NEGATIVE mg/dL
Ketones, ur: NEGATIVE mg/dL
Leukocytes,Ua: NEGATIVE
Nitrite: NEGATIVE
Protein, ur: NEGATIVE mg/dL
Specific Gravity, Urine: 1.008 (ref 1.005–1.030)
pH: 6 (ref 5.0–8.0)

## 2020-09-17 LAB — COMPREHENSIVE METABOLIC PANEL
ALT: 24 U/L (ref 0–44)
AST: 26 U/L (ref 15–41)
Albumin: 4 g/dL (ref 3.5–5.0)
Alkaline Phosphatase: 86 U/L (ref 38–126)
Anion gap: 9 (ref 5–15)
BUN: 15 mg/dL (ref 8–23)
CO2: 26 mmol/L (ref 22–32)
Calcium: 9.4 mg/dL (ref 8.9–10.3)
Chloride: 100 mmol/L (ref 98–111)
Creatinine, Ser: 1.22 mg/dL (ref 0.61–1.24)
GFR, Estimated: 57 mL/min — ABNORMAL LOW (ref >60)
Glucose, Bld: 116 mg/dL — ABNORMAL HIGH (ref 70–99)
Potassium: 4.3 mmol/L (ref 3.5–5.1)
Sodium: 135 mmol/L (ref 135–145)
Total Bilirubin: 1.1 mg/dL (ref 0.3–1.2)
Total Protein: 7.5 g/dL (ref 6.5–8.1)

## 2020-09-17 LAB — LIPASE, BLOOD: Lipase: 36 U/L (ref 11–51)

## 2020-09-17 LAB — TROPONIN I (HIGH SENSITIVITY)
Troponin I (High Sensitivity): 10 ng/L (ref <18)
Troponin I (High Sensitivity): 11 ng/L (ref <18)

## 2020-09-17 MED ORDER — LIDOCAINE 5 % EX PTCH: 1.0000 | MEDICATED_PATCH | CUTANEOUS | 0 refills | Status: DC

## 2020-09-17 MED ORDER — IOHEXOL 300 MG/ML  SOLN
100.0000 mL | Freq: Once | INTRAMUSCULAR | Status: AC | PRN
  Administered 2020-09-17: 100 mL via INTRAVENOUS

## 2020-09-17 MED ORDER — METHYLPREDNISOLONE 4 MG PO TBPK: ORAL_TABLET | ORAL | 0 refills | Status: DC

## 2020-09-17 MED ORDER — METHOCARBAMOL 500 MG PO TABS: 500.0000 mg | ORAL_TABLET | Freq: Two times a day (BID) | ORAL | 0 refills | Status: DC

## 2020-09-17 MED ORDER — BACLOFEN 10 MG PO TABS: 5.0000 mg | ORAL_TABLET | Freq: Three times a day (TID) | ORAL | 3 refills | Status: DC | PRN

## 2020-09-17 MED ORDER — TRAMADOL HCL 50 MG PO TABS: 50.0000 mg | ORAL_TABLET | Freq: Four times a day (QID) | ORAL | 0 refills | Status: DC | PRN

## 2020-09-17 NOTE — ED Triage Notes (Signed)
Pt reports right side lower back pain 2-3 days that radiates up into his side and chest. Had sob last night. Denies n/v. Airway intact, no acute distress noted at triage.

## 2020-09-17 NOTE — Telephone Encounter (Signed)
Please advise 

## 2020-09-17 NOTE — ED Provider Notes (Signed)
Emergency Medicine Provider Triage Evaluation Note  [name] 87 y.o. male was evaluated in triage.  Pt complains of right-sided back pain that radiates into his right abdomen that has been ongoing for the last 3 to 4 days.  No preceding trauma, injury.  He states that he feels like it wraps around to his back and occasionally will come into his abdomen.  No associated nausea, vomiting, diaphoresis.  No dysuria, hematuria.  He states occasionally he will have some chest tightness but denies any chest pain.  No trouble breathing.  He went to EMS he did prompt emergency department.   Review of Systems  Positive: Back pain, abdominal pain, chest tightness Negative: Nausea, vomiting, dysuria, hematuria  Physical Exam  BP 134/82   Pulse 70   Temp 98.2 F (36.8 C) (Oral)   Resp 18   Ht 5\' 4"  (1.626 m)   Wt 65.8 kg   SpO2 100%   BMI 24.89 kg/m  Gen:   Awake, no distress   HEENT:  Atraumatic  Resp:  Normal effort.  Lungs CTA B. Cardiac:  Normal rate.  Abd:   Nondistended, tenderness palpation in the right lower quadrant MSK:   Moves extremities without difficulty  Neuro:  Speech clear   Other:   Tenderness palpation in the paraspinal muscles of the right mid thoracic, lower thoracic, upper lumbar region.  No overlying warmth, erythema, edema.  Medical Decision Making  Medically screening exam initiated at 11:11 AM  Appropriate orders placed.  [name] was informed that the remainder of the evaluation will be completed by another provider, this initial triage assessment does not replace that evaluation. They are counseled that they will need to remain in the ED until the completion of their workup, including full H&P and results of any tests.  Risks of leaving the emergency department prior to completion of treatment were discussed. Patient was advised to inform ED staff if they are leaving before their treatment is complete. The patient acknowledged these risks and time was allowed  for questions.     The patient appears stable so that the remainder of the MSE may be completed by another provider.    Clinical Impression  Back pain, abdominal pain, chest tightness.   Portions of this note were generated with Lobbyist. Dictation errors may occur despite best attempts at proofreading.     Volanda Napoleon, PA-C 09/17/20 1113    Lucrezia Starch, MD 09/18/20 [phone]

## 2020-09-17 NOTE — Telephone Encounter (Signed)
Patients daughter called she stated patient is having back pain and the pain has moved to his side and shoulders  patient is requesting to be worked into Dr.Hilts schedule, patient has a appointment scheduled for Friday patient stated he cant wait any longer to be seen call back:[phone]

## 2020-09-17 NOTE — Addendum Note (Signed)
Addended by: Hortencia Pilar on: 09/17/2020 11:23 AM   Modules accepted: Orders

## 2020-09-17 NOTE — ED Provider Notes (Signed)
[facility] EMERGENCY DEPARTMENT Provider Note   CSN: [id number] Arrival date & time: 09/17/20  1051     History Chief Complaint  Patient presents with   Abdominal Pain   Back Pain   Chest Pain    [name] is a 87 y.o. male.  87 year old male presents with right-sided back pain x3 to 4 days.  Pain radiates up into his right upper as well as right lower quadrant.  No associated fever or chills.  No cough congestion.  Has had trouble sleeping.  Denies any urinary symptoms.  Pain is also somewhat positional.  No treatment use prior to arrival      Past Medical History:  Diagnosis Date   Bradycardia    a. nocturnal with pauses overnight on tele likely due to OSA 10/2015.   Cancer [facility])    skin cancer   CKD (chronic kidney disease), stage III (HCC)    Coronary artery disease    a. prev nonobst. b. LHC 10/24/15: occlusion of small diffusely diseased OM2 s/p balloon angioplasty, mild nonobstructive disease of mLAD and mRCA, normal LVEDP   Diabetes mellitus without complication ([place])    Diabetic peripheral neuropathy ([place]) 10/04/2019   Diverticulosis    Esophageal reflux    Esophageal stricture from GERD 06/08/2011   With stricture at GE junction on EGD, dilated 06/2011    Gastric ulcer    on EGD 2013   Gout, unspecified    "on daily RX" (09/09/2017)   History of blood transfusion 02/1964   "when I had right little finger cut off"   Hyperlipidemia    Hypertension    Obesity, unspecified    Orthopnea    OSA on CPAP    Paroxysmal nocturnal dyspnea    Persistent atrial fibrillation ([place])    Vertigo     Patient Active Problem List   Diagnosis Date Noted   Lower urinary tract symptoms (LUTS) 09/16/2020   Creatinine elevation 07/31/2020   Drug-induced myopathy 07/31/2020   Diabetic polyneuropathy associated with type 2 diabetes mellitus ([place]) 06/20/2020   Gouty arthritis of left foot 06/20/2020   Diabetic peripheral neuropathy ([place]) 10/04/2019    Healthcare maintenance 05/22/2019   B12 deficiency 05/17/2019   Idiopathic chronic gout, unspecified site, without tophus (tophi) 61/95/0932   Acute diastolic heart failure (HCC)    CAD S/P percutaneous coronary angioplasty    Chronic atrial fibrillation (HCC)    CKD (chronic kidney disease), stage III ([place])    Hypertension    Hyperlipidemia    Back pain 05/24/2015   Eustachian tube dysfunction 05/24/2015   Dysphagia, pharyngoesophageal phase 09/25/2014   History of esophageal stricture 09/25/2014   Chronic anticoagulation 09/25/2014   Esophageal stricture from GERD 06/08/2011   Advance care planning 04/20/2011   OSA on CPAP 09/22/2010   Gout 08/15/2008   OBESITY 08/15/2008   Essential hypertension 08/15/2008   CAD (coronary artery disease), native coronary artery 08/15/2008   GASTROESOPHAGEAL REFLUX DISEASE 08/15/2008    Past Surgical History:  Procedure Laterality Date   AMPUTATION Right 03/03/2019   Procedure: RIGHT 2ND TOE AMPUTATION;  Surgeon: [name], MD;  Location: [place];  Service: Orthopedics;  Laterality: Right;   BALLOON DILATION N/A 10/22/2014   Procedure: BALLOON DILATION;  Surgeon: [name], MD;  Location[facility];  Service: Endoscopy;  Laterality: N/A;   CARDIAC CATHETERIZATION  07/2008   CARDIAC CATHETERIZATION N/A 10/24/2015   Procedure: Left Heart Cath and Coronary Angiography;  Surgeon: [name],  MD;  Location: [place] CV [facility];  Service: Cardiovascular;  Laterality: N/A;   CARDIAC CATHETERIZATION N/A 10/24/2015   Procedure: Coronary Balloon Angioplasty;  Surgeon: [name], MD;  Location: [place] [facility];  Service: Cardiovascular;  Laterality: N/A;   Carotid Dopplers  09/2009   no sig extracranial stenosis and vertebral arteries had antegrade flow   CATARACT EXTRACTION W/ INTRAOCULAR LENS  IMPLANT, BILATERAL Bilateral    ESOPHAGOGASTRODUODENOSCOPY (EGD) WITH ESOPHAGEAL DILATION     "I've had it stretched 3 times" (10/23/2015)    ESOPHAGOGASTRODUODENOSCOPY (EGD) WITH ESOPHAGEAL DILATION  06/2011   /medical hx above  (09/09/2017)   ESOPHAGOGASTRODUODENOSCOPY (EGD) WITH PROPOFOL N/A 10/22/2014   Procedure: ESOPHAGOGASTRODUODENOSCOPY (EGD) WITH PROPOFOL;  Surgeon: [name], MD;  Location: [facility];  Service: Endoscopy;  Laterality: N/A;   FINGER SURGERY Right 02/1964   "cut little finger off; had it reattached"   I & D EXTREMITY Right 09/10/2017   Procedure: RIGHT FOOT DEBRIDEMENT;  Surgeon: [name], MD;  Location: [place];  Service: Orthopedics;  Laterality: Right;   MRI of brain  09/2009   chronic microvascular ischemia   OSA Sleep Study     per Dr. Brett Fairy   SHOULDER ARTHROSCOPY W/ ROTATOR CUFF REPAIR Right 2008       Family History  Problem Relation Age of Onset   Stomach cancer Mother        died in 18's    Hypertension Father        died in his 36's pna likely dementia   Dementia Father    Pneumonia Father    Heart disease Brother        S/P CABG   Hypertension Other    Prostate cancer Neg Hx    Colon cancer Neg Hx     Social History   Tobacco Use   Smoking status: Former    Packs/day: 0.12    Years: 4.00    Pack years: 0.48    Types: Cigarettes    Quit date: 03/16/1969    Years since quitting: 51.5   Smokeless tobacco: Former   Tobacco comments:    "chewed when I smoked; smoked 1 pack/week for about 4 years, quit 1971  Vaping Use   Vaping Use: Never used  Substance Use Topics   Alcohol use: Not Currently    Alcohol/week: 0.0 standard drinks    Comment: 09/09/2017  "Quit all alcohol in 1970's"   Drug use: Never    Home Medications Prior to Admission medications   Medication Sig Start Date End Date Taking? Authorizing Provider  acetaminophen (TYLENOL) 500 MG tablet Take 1,000 mg by mouth as needed for mild pain.    [provider]  allopurinol (ZYLOPRIM) 100 MG tablet TAKE 1 TABLET BY MOUTH EVERY DAY 12/08/19   Tonia Ghent, MD  baclofen (LIORESAL) 10 MG tablet  Take 0.5-1 tablets (5-10 mg total) by mouth 3 (three) times daily as needed for muscle spasms. 09/17/20   Hilts, Legrand Como, MD  cyanocobalamin (,VITAMIN B-12,) 1000 MCG/ML injection 1036mcg IM every 28 days 09/13/20   Tonia Ghent, MD  diltiazem [facility]) 180 MG 24 hr capsule TAKE 1 CAPSULE BY MOUTH EVERY DAY 07/26/20   Sherren Mocha, MD  fluticasone [facility]) 50 MCG/ACT nasal spray Place 2 sprays into both nostrils daily. 09/13/20   Tonia Ghent, MD  furosemide (LASIX) 20 MG tablet TAKE 1 TABLET BY MOUTH DAILY. PLEASE MAKE YEARLY APPT WITH DR. Burt Knack FOR MAY BEFORE ANYMORE  REFILLS 11/06/19   Sherren Mocha, MD  lisinopril (ZESTRIL) 10 MG tablet Take 1 tablet (10 mg total) by mouth daily. 09/13/20   Tonia Ghent, MD  methylPREDNISolone (MEDROL DOSEPAK) 4 MG TBPK tablet As directed for 6 days. 09/17/20   Hilts, Legrand Como, MD  metoprolol tartrate (LOPRESSOR) 25 MG tablet TAKE 1 TABLET BY MOUTH TWICE A DAY 08/23/20   Sherren Mocha, MD  omeprazole (PRILOSEC) 40 MG capsule TAKE 1 CAPSULE BY MOUTH EVERY DAY 08/27/20   Tonia Ghent, MD  tamsulosin (FLOMAX) 0.4 MG CAPS capsule Take 1 capsule (0.4 mg total) by mouth daily. 09/13/20   Tonia Ghent, MD  traMADol (ULTRAM) 50 MG tablet Take 1 tablet (50 mg total) by mouth every 6 (six) hours as needed. 09/17/20   Hilts, Legrand Como, MD  warfarin (COUMADIN) 4 MG tablet Take 1 to 1 and 1/2 tablets by mouth daily or as directed by Coumadin Clinic 09/17/20   Sherren Mocha, MD    Allergies    Penicillins, Atorvastatin, Pravastatin, Colchicine, Hydrocodone, Nsaids, Uloric [febuxostat], Vancomycin, Biaxin [clarithromycin], and Doxycycline  Review of Systems   Review of Systems  All other systems reviewed and are negative.  Physical Exam Updated Vital Signs BP (!) 162/92   Pulse (!) 102   Temp 98.2 F (36.8 C)   Resp (!) 22   SpO2 97%   Physical Exam Vitals and nursing note reviewed.  Constitutional:      General: He is not in acute distress.     Appearance: Normal appearance. He is well-developed. He is not toxic-appearing.  HENT:     Head: Normocephalic and atraumatic.  Eyes:     General: Lids are normal.     Conjunctiva/sclera: Conjunctivae normal.     Pupils: Pupils are equal, round, and reactive to light.  Neck:     Thyroid: No thyroid mass.     Trachea: No tracheal deviation.  Cardiovascular:     Rate and Rhythm: Normal rate and regular rhythm.     Heart sounds: Normal heart sounds. No murmur heard.   No gallop.  Pulmonary:     Effort: Pulmonary effort is normal. No respiratory distress.     Breath sounds: Normal breath sounds. No stridor. No decreased breath sounds, wheezing, rhonchi or rales.  Abdominal:     General: There is distension.     Palpations: Abdomen is soft.     Tenderness: There is abdominal tenderness in the right upper quadrant and right lower quadrant. There is guarding. There is no rebound.  Musculoskeletal:        General: No tenderness. Normal range of motion.     Cervical back: Normal range of motion and neck supple.  Skin:    General: Skin is warm and dry.     Findings: No abrasion or rash.  Neurological:     Mental Status: He is alert and oriented to person, place, and time. Mental status is at baseline.     GCS: GCS eye subscore is 4. GCS verbal subscore is 5. GCS motor subscore is 6.     Cranial Nerves: Cranial nerves are intact. No cranial nerve deficit.     Sensory: No sensory deficit.     Motor: Motor function is intact.  Psychiatric:        Attention and Perception: Attention normal.        Speech: Speech normal.        Behavior: Behavior normal.    ED Results / Procedures / Treatments  Labs (all labs ordered are listed, but only abnormal results are displayed) Labs Reviewed  COMPREHENSIVE METABOLIC PANEL - Abnormal; Notable for the following components:      Result Value   Glucose, Bld 116 (*)    GFR, Estimated 57 (*)    All other components within normal limits  CBC WITH  DIFFERENTIAL/PLATELET - Abnormal; Notable for the following components:   Hemoglobin 17.5 (*)    Platelets 136 (*)    All other components within normal limits  LIPASE, BLOOD  URINALYSIS, ROUTINE W REFLEX MICROSCOPIC  TROPONIN I (HIGH SENSITIVITY)  TROPONIN I (HIGH SENSITIVITY)    EKG EKG Interpretation  Date/Time:  Tuesday September 17 2020 11:07:53 EDT Ventricular Rate:  122 PR Interval:  172 QRS Duration: 68 QT Interval:  298 QTC Calculation: 424 R Axis:   20 Text Interpretation: Sinus tachycardia with Premature supraventricular complexes Otherwise normal ECG Confirmed by Lacretia Leigh ([id number]) on 09/17/2020 4:19:00 PM  Radiology DG Chest 2 View  Result Date: 09/17/2020 CLINICAL DATA:  Back pain. EXAM: CHEST - 2 VIEW COMPARISON:  Jul 25, 2019. FINDINGS: The heart size and mediastinal contours are within normal limits. No pneumothorax or pleural effusion is noted. Stable multiple calcified granulomata are noted. No acute pulmonary disease is noted. The visualized skeletal structures are unremarkable. IMPRESSION: No active cardiopulmonary disease. Electronically Signed   By: Marijo Conception M.D.   On: 09/17/2020 11:40    Procedures Procedures   Medications Ordered in ED Medications - No data to display  ED Course  I have reviewed the triage vital signs and the nursing notes.  Pertinent labs & imaging results that were available during my care of the patient were reviewed by me and considered in my medical decision making (see chart for details).    MDM Rules/Calculators/A&P                          Abdominal CT without acute findings.  Patient states that pain is somewhat positional.  Will prescribe anti-inflammatories and muscle relaxants Final Clinical Impression(s) / ED Diagnoses Final diagnoses:  None    Rx / DC Orders ED Discharge Orders     None        Lacretia Leigh, MD 09/17/20 [phone]

## 2020-09-18 DIAGNOSIS — N184 Chronic kidney disease, stage 4 (severe): Secondary | ICD-10-CM | POA: Diagnosis not present

## 2020-09-20 ENCOUNTER — Other Ambulatory Visit: Payer: Self-pay

## 2020-09-20 ENCOUNTER — Ambulatory Visit (INDEPENDENT_AMBULATORY_CARE_PROVIDER_SITE_OTHER): Payer: Medicare Other | Admitting: Family Medicine

## 2020-09-20 ENCOUNTER — Encounter: Payer: Self-pay | Admitting: Family Medicine

## 2020-09-20 DIAGNOSIS — M545 Low back pain, unspecified: Secondary | ICD-10-CM | POA: Diagnosis not present

## 2020-09-20 NOTE — Progress Notes (Signed)
Office Visit Note   Patient: [name]           Date of Birth: 03-18-1930           MRN: [id number] Visit Date: 09/20/2020 Requested by: Tonia Ghent, MD [place],   [id number] PCP: Tonia Ghent, MD  Subjective: Chief Complaint  Patient presents with   Lower Back - Follow-up, Pain    Patient is feeling some better after starting medrol dosepak and baclofen. Has only taken 2 of the Tramadol - taking Tylenol for pain mostly. "Knot" on the right lower back.     HPI: He is here with right lower back pain.  Symptoms started about a week ago.  Pain in the right upper lumbar area with radiation into the abdomen.  He went to the hospital where imaging was negative for intra-abdominal pathology.  He started taking prednisone and is feeling somewhat better.              ROS:   All other systems were reviewed and are negative.  Objective: Vital Signs: There were no vitals taken for this visit.  Physical Exam:  General:  Alert and oriented, in no acute distress. Pulm:  Breathing unlabored. Psy:  Normal mood, congruent affect. Skin: No rash Low back: He has a tender trigger point to the right of midline at around L2 in the paraspinous muscles which seems to reproduce his pain.   Imaging: No results found.  Assessment & Plan: Probable myofascial low back pain -Discussed with him and elected to inject the trigger point today.  Follow-up as needed.     Procedures: Right lumbar trigger point injection: After sterile prep with Betadine, injected 3 cc 1% lidocaine without epinephrine and 6 mg betamethasone.       PMFS History: Patient Active Problem List   Diagnosis Date Noted   Lower urinary tract symptoms (LUTS) 09/16/2020   Creatinine elevation 07/31/2020   Drug-induced myopathy 07/31/2020   Diabetic polyneuropathy associated with type 2 diabetes mellitus ([place]) 06/20/2020   Gouty arthritis of left foot 06/20/2020   Diabetic peripheral  neuropathy ([place]) 10/04/2019   Healthcare maintenance 05/22/2019   B12 deficiency 05/17/2019   Idiopathic chronic gout, unspecified site, without tophus (tophi) 92/33/0076   Acute diastolic heart failure (HCC)    CAD S/P percutaneous coronary angioplasty    Chronic atrial fibrillation (HCC)    CKD (chronic kidney disease), stage III ([place])    Hypertension    Hyperlipidemia    Back pain 05/24/2015   Eustachian tube dysfunction 05/24/2015   Dysphagia, pharyngoesophageal phase 09/25/2014   History of esophageal stricture 09/25/2014   Chronic anticoagulation 09/25/2014   Esophageal stricture from GERD 06/08/2011   Advance care planning 04/20/2011   OSA on CPAP 09/22/2010   Gout 08/15/2008   OBESITY 08/15/2008   Essential hypertension 08/15/2008   CAD (coronary artery disease), native coronary artery 08/15/2008   GASTROESOPHAGEAL REFLUX DISEASE 08/15/2008   Past Medical History:  Diagnosis Date   Bradycardia    a. nocturnal with pauses overnight on tele likely due to OSA 10/2015.   Cancer ([place])    skin cancer   CKD (chronic kidney disease), stage III (HCC)    Coronary artery disease    a. prev nonobst. b. LHC 10/24/15: occlusion of small diffusely diseased OM2 s/p balloon angioplasty, mild nonobstructive disease of mLAD and mRCA, normal LVEDP   Diabetes mellitus without complication ([place])    Diabetic peripheral neuropathy ([place])  10/04/2019   Diverticulosis    Esophageal reflux    Esophageal stricture from GERD 06/08/2011   With stricture at GE junction on EGD, dilated 06/2011    Gastric ulcer    on EGD 2013   Gout, unspecified    "on daily RX" (09/09/2017)   History of blood transfusion 02/1964   "when I had right little finger cut off"   Hyperlipidemia    Hypertension    Obesity, unspecified    Orthopnea    OSA on CPAP    Paroxysmal nocturnal dyspnea    Persistent atrial fibrillation (HCC)    Vertigo     Family History  Problem Relation Age of Onset   Stomach cancer Mother         died in 68's    Hypertension Father        died in his 3's pna likely dementia   Dementia Father    Pneumonia Father    Heart disease Brother        S/P CABG   Hypertension Other    Prostate cancer Neg Hx    Colon cancer Neg Hx     Past Surgical History:  Procedure Laterality Date   AMPUTATION Right 03/03/2019   Procedure: RIGHT 2ND TOE AMPUTATION;  Surgeon: [name], MD;  Location: [place];  Service: Orthopedics;  Laterality: Right;   BALLOON DILATION N/A 10/22/2014   Procedure: BALLOON DILATION;  Surgeon: [name], MD;  [facility];  Service: Endoscopy;  Laterality: N/A;   CARDIAC CATHETERIZATION  07/2008   CARDIAC CATHETERIZATION N/A 10/24/2015   Procedure: Left Heart Cath and Coronary Angiography;  Surgeon: [name], MD;  Location: [place] [facility];  Service: Cardiovascular;  Laterality: N/A;   CARDIAC CATHETERIZATION N/A 10/24/2015   Procedure: Coronary Balloon Angioplasty;  Surgeon: [name], MD;  Location: [place] [facility];  Service: Cardiovascular;  Laterality: N/A;   Carotid Dopplers  09/2009   no sig extracranial stenosis and vertebral arteries had antegrade flow   CATARACT EXTRACTION W/ INTRAOCULAR LENS  IMPLANT, BILATERAL Bilateral    ESOPHAGOGASTRODUODENOSCOPY (EGD) WITH ESOPHAGEAL DILATION     "I've had it stretched 3 times" (10/23/2015)   ESOPHAGOGASTRODUODENOSCOPY (EGD) WITH ESOPHAGEAL DILATION  06/2011   /medical hx above  (09/09/2017)   ESOPHAGOGASTRODUODENOSCOPY (EGD) WITH PROPOFOL N/A 10/22/2014   Procedure: ESOPHAGOGASTRODUODENOSCOPY (EGD) WITH PROPOFOL;  Surgeon: [name], MD;  [facility];  Service: Endoscopy;  Laterality: N/A;   FINGER SURGERY Right 02/1964   "cut little finger off; had it reattached"   I & D EXTREMITY Right 09/10/2017   Procedure: RIGHT FOOT DEBRIDEMENT;  Surgeon: [name], MD;  Location: [place];  Service: Orthopedics;  Laterality: Right;   MRI of brain  09/2009   chronic  microvascular ischemia   OSA Sleep Study     per Dr. 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

## 2020-09-21 DIAGNOSIS — U071 COVID-19: Secondary | ICD-10-CM | POA: Diagnosis not present

## 2020-09-24 ENCOUNTER — Other Ambulatory Visit: Payer: Self-pay

## 2020-09-24 ENCOUNTER — Ambulatory Visit
Admission: RE | Admit: 2020-09-24 | Discharge: 2020-09-24 | Disposition: A | Discharge status: Home / Self Care | Payer: Medicare Other | Source: Ambulatory Visit | Attending: Nephrology | Admitting: Nephrology

## 2020-09-24 ENCOUNTER — Ambulatory Visit (INDEPENDENT_AMBULATORY_CARE_PROVIDER_SITE_OTHER): Payer: Medicare Other

## 2020-09-24 DIAGNOSIS — N184 Chronic kidney disease, stage 4 (severe): Secondary | ICD-10-CM

## 2020-09-24 DIAGNOSIS — Z8679 Personal history of other diseases of the circulatory system: Secondary | ICD-10-CM | POA: Diagnosis not present

## 2020-09-24 DIAGNOSIS — E1122 Type 2 diabetes mellitus with diabetic chronic kidney disease: Secondary | ICD-10-CM | POA: Diagnosis not present

## 2020-09-24 DIAGNOSIS — E538 Deficiency of other specified B group vitamins: Secondary | ICD-10-CM | POA: Diagnosis not present

## 2020-09-24 DIAGNOSIS — N281 Cyst of kidney, acquired: Secondary | ICD-10-CM | POA: Diagnosis not present

## 2020-09-24 MED ORDER — CYANOCOBALAMIN 1000 MCG/ML IJ SOLN
1000.0000 ug | Freq: Once | INTRAMUSCULAR | Status: AC
  Administered 2020-09-24: 1000 ug via INTRAMUSCULAR

## 2020-09-24 NOTE — Progress Notes (Signed)
Per orders of Dr. Damita Dunnings, every 28 days injection of B12 1000 mcg/ml given by Pilar Grammes, CMA in Right Deltoid. Patient tolerated injection well.

## 2020-10-01 DIAGNOSIS — J0100 Acute maxillary sinusitis, unspecified: Secondary | ICD-10-CM | POA: Diagnosis not present

## 2020-10-01 DIAGNOSIS — J3489 Other specified disorders of nose and nasal sinuses: Secondary | ICD-10-CM | POA: Diagnosis not present

## 2020-10-03 ENCOUNTER — Ambulatory Visit (INDEPENDENT_AMBULATORY_CARE_PROVIDER_SITE_OTHER): Payer: Medicare Other | Admitting: *Deleted

## 2020-10-03 ENCOUNTER — Other Ambulatory Visit: Payer: Self-pay

## 2020-10-03 DIAGNOSIS — I4819 Other persistent atrial fibrillation: Secondary | ICD-10-CM | POA: Diagnosis not present

## 2020-10-03 DIAGNOSIS — Z5181 Encounter for therapeutic drug level monitoring: Secondary | ICD-10-CM

## 2020-10-03 LAB — POCT INR: INR: 2 (ref 2.0–3.0)

## 2020-10-03 NOTE — Patient Instructions (Signed)
Description   Since you started the new medication sulfamethoxazole ds (this can increase INR), take 1 tablet until INR recheck on Monday.  Normal dose: 1 tablet daily except 1.5 tablets on Sundays and Thursdays. Recheck INR 4 weeks.  Call Coumadin clinic for any changes in medications or up coming procedures. [phone].

## 2020-10-07 ENCOUNTER — Other Ambulatory Visit: Payer: Self-pay

## 2020-10-07 ENCOUNTER — Ambulatory Visit (INDEPENDENT_AMBULATORY_CARE_PROVIDER_SITE_OTHER): Payer: Medicare Other

## 2020-10-07 DIAGNOSIS — Z5181 Encounter for therapeutic drug level monitoring: Secondary | ICD-10-CM | POA: Diagnosis not present

## 2020-10-07 DIAGNOSIS — I4819 Other persistent atrial fibrillation: Secondary | ICD-10-CM | POA: Diagnosis not present

## 2020-10-07 LAB — POCT INR: INR: 2.3 (ref 2.0–3.0)

## 2020-10-07 NOTE — Patient Instructions (Signed)
Description   Continue on same dosage 1 tablet daily except 1.5 tablets on Sundays and Thursdays. Will complete Bactrim in 2-3 days.  Recheck INR 4 weeks.  Call Coumadin clinic for any changes in medications or up coming procedures. [phone].

## 2020-10-16 ENCOUNTER — Ambulatory Visit (INDEPENDENT_AMBULATORY_CARE_PROVIDER_SITE_OTHER): Payer: Medicare Other | Admitting: Family Medicine

## 2020-10-16 ENCOUNTER — Encounter: Payer: Self-pay | Admitting: Family Medicine

## 2020-10-16 ENCOUNTER — Other Ambulatory Visit: Payer: Self-pay

## 2020-10-16 DIAGNOSIS — M545 Low back pain, unspecified: Secondary | ICD-10-CM

## 2020-10-16 MED ORDER — BACLOFEN 10 MG PO TABS: 5.0000 mg | ORAL_TABLET | Freq: Three times a day (TID) | ORAL | 3 refills | Status: DC | PRN

## 2020-10-16 NOTE — Progress Notes (Signed)
Office Visit Note   Patient: [name]           Date of Birth: 1930/10/24           MRN: [id number] Visit Date: 10/16/2020 Requested by: Tonia Ghent, MD [address] [place],  [place] [address] PCP: Tonia Ghent, MD  Subjective: Chief Complaint  Patient presents with   Lower Back - Pain    Trigger point injection at last ov did help until a week ago. Pain returned same as before. Cannot sleep on the left side of his body -- the right lower back will just throb. The muscle relaxer and a couple Tylenol help him sleep. He just finished a medrol dosepak and antibiotic for a sinus infection.    HPI: Is here with recurrent right-sided lower back pain.  Trigger point injection helped, but the few days ago his pain came back.  It hurts when he sleeps on his left side.  The pain radiates into the right upper abdomen.  The pain always starts in his back.  No rash, he has not had any abdominal surgeries.                ROS:   All other systems were reviewed and are negative.  Objective: Vital Signs: There were no vitals taken for this visit.  Physical Exam:  General:  Alert and oriented, in no acute distress. Pulm:  Breathing unlabored. Psy:  Normal mood, congruent affect. Skin: No visible rash Low back: He has a tender trigger point in the deep right sided paraspinous muscles near the L2-3 level.  Palpation of this reproduces his pain.  Imaging: No results found.  Assessment & Plan: Right-sided low back pain, suspect myofascial but could be foraminal stenosis. -Trigger point injection using bupivacaine today.  Physical therapy for dry needling.  If nothing helps, then referral to Dr. Ernestina Patches for epidural injection.     Procedures: After sterile prep with alcohol, injected 5 cc 0.25% bupivacaine into the symptomatic trigger point.  No steroid was used.       PMFS History: Patient Active Problem List   Diagnosis Date Noted   Lower urinary tract symptoms  (LUTS) 09/16/2020   Creatinine elevation 07/31/2020   Drug-induced myopathy 07/31/2020   Diabetic polyneuropathy associated with type 2 diabetes mellitus ([place]) 06/20/2020   Gouty arthritis of left foot 06/20/2020   Diabetic peripheral neuropathy ([place]) 10/04/2019   Healthcare maintenance 05/22/2019   B12 deficiency 05/17/2019   Idiopathic chronic gout, unspecified site, without tophus (tophi) 65/99/3570   Acute diastolic heart failure (HCC)    CAD S/P percutaneous coronary angioplasty    Chronic atrial fibrillation (HCC)    CKD (chronic kidney disease), stage III ([place])    Hypertension    Hyperlipidemia    Back pain 05/24/2015   Eustachian tube dysfunction 05/24/2015   Dysphagia, pharyngoesophageal phase 09/25/2014   History of esophageal stricture 09/25/2014   Chronic anticoagulation 09/25/2014   Esophageal stricture from GERD 06/08/2011   Advance care planning 04/20/2011   OSA on CPAP 09/22/2010   Gout 08/15/2008   OBESITY 08/15/2008   Essential hypertension 08/15/2008   CAD (coronary artery disease), native coronary artery 08/15/2008   GASTROESOPHAGEAL REFLUX DISEASE 08/15/2008   Past Medical History:  Diagnosis Date   Bradycardia    a. nocturnal with pauses overnight on tele likely due to OSA 10/2015.   Cancer ([place])    skin cancer   CKD (chronic kidney disease), stage III (  [place])    Coronary artery disease    a. prev nonobst. b. LHC 10/24/15: occlusion of small diffusely diseased OM2 s/p balloon angioplasty, mild nonobstructive disease of mLAD and mRCA, normal LVEDP   Diabetes mellitus without complication (HCC)    Diabetic peripheral neuropathy ([place]) 10/04/2019   Diverticulosis    Esophageal reflux    Esophageal stricture from GERD 06/08/2011   With stricture at GE junction on EGD, dilated 06/2011    Gastric ulcer    on EGD 2013   Gout, unspecified    "on daily RX" (09/09/2017)   History of blood transfusion 02/1964   "when I had right little finger cut off"    Hyperlipidemia    Hypertension    Obesity, unspecified    Orthopnea    OSA on CPAP    Paroxysmal nocturnal dyspnea    Persistent atrial fibrillation (HCC)    Vertigo     Family History  Problem Relation Age of Onset   Stomach cancer Mother        died in 64's    Hypertension Father        died in his 31's pna likely dementia   Dementia Father    Pneumonia Father    Heart disease Brother        S/P CABG   Hypertension Other    Prostate cancer Neg Hx    Colon cancer Neg Hx     Past Surgical History:  Procedure Laterality Date   AMPUTATION Right 03/03/2019   Procedure: RIGHT 2ND TOE AMPUTATION;  Surgeon: [name], MD;  Location: [facility];  Service: Orthopedics;  Laterality: Right;   BALLOON DILATION N/A 10/22/2014   Procedure: BALLOON DILATION;  Surgeon: [name], MD;  [facility];  Service: Endoscopy;  Laterality: N/A;   CARDIAC CATHETERIZATION  07/2008   CARDIAC CATHETERIZATION N/A 10/24/2015   Procedure: Left Heart Cath and Coronary Angiography;  Surgeon: [name], MD;  Location: [facility];  Service: Cardiovascular;  Laterality: N/A;   CARDIAC CATHETERIZATION N/A 10/24/2015   Procedure: Coronary Balloon Angioplasty;  Surgeon: [name], MD;  Location: [facility];  Service: Cardiovascular;  Laterality: N/A;   Carotid Dopplers  09/2009   no sig extracranial stenosis and vertebral arteries had antegrade flow   CATARACT EXTRACTION W/ INTRAOCULAR LENS  IMPLANT, BILATERAL Bilateral    ESOPHAGOGASTRODUODENOSCOPY (EGD) WITH ESOPHAGEAL DILATION     "I've had it stretched 3 times" (10/23/2015)   ESOPHAGOGASTRODUODENOSCOPY (EGD) WITH ESOPHAGEAL DILATION  06/2011   /medical hx above  (09/09/2017)   ESOPHAGOGASTRODUODENOSCOPY (EGD) WITH PROPOFOL N/A 10/22/2014   Procedure: ESOPHAGOGASTRODUODENOSCOPY (EGD) WITH PROPOFOL;  Surgeon: [name], MD;  [facility];  Service: Endoscopy;  Laterality: N/A;   FINGER SURGERY Right 02/1964    "cut little finger off; had it reattached"   I & D EXTREMITY Right 09/10/2017   Procedure: RIGHT FOOT DEBRIDEMENT;  Surgeon: [name], MD;  Location: [facility];  Service: Orthopedics;  Laterality: Right;   MRI of brain  09/2009   chronic microvascular ischemia   OSA Sleep Study     per Dr. 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

## 2020-10-17 ENCOUNTER — Other Ambulatory Visit: Payer: Self-pay | Admitting: Cardiovascular Disease

## 2020-10-17 DIAGNOSIS — I2510 Atherosclerotic heart disease of native coronary artery without angina pectoris: Secondary | ICD-10-CM

## 2020-10-17 DIAGNOSIS — I4819 Other persistent atrial fibrillation: Secondary | ICD-10-CM

## 2020-10-17 DIAGNOSIS — R42 Dizziness and giddiness: Secondary | ICD-10-CM

## 2020-10-18 ENCOUNTER — Other Ambulatory Visit: Payer: Self-pay | Admitting: Family Medicine

## 2020-10-21 ENCOUNTER — Other Ambulatory Visit: Payer: Self-pay

## 2020-10-21 ENCOUNTER — Encounter: Payer: Self-pay | Admitting: Physical Therapy

## 2020-10-21 ENCOUNTER — Ambulatory Visit (INDEPENDENT_AMBULATORY_CARE_PROVIDER_SITE_OTHER): Payer: Medicare Other | Admitting: Physical Therapy

## 2020-10-21 DIAGNOSIS — R262 Difficulty in walking, not elsewhere classified: Secondary | ICD-10-CM

## 2020-10-21 DIAGNOSIS — R293 Abnormal posture: Secondary | ICD-10-CM | POA: Diagnosis not present

## 2020-10-21 DIAGNOSIS — M545 Low back pain, unspecified: Secondary | ICD-10-CM

## 2020-10-21 NOTE — Therapy (Signed)
[facility] Physical Therapy [address] [place], [place], [address] Phone: [phone]   Fax:  [phone]  Physical Therapy Evaluation  Patient Details  Name: [name] MRN: [id number] Date of Birth: 10-22-1930 Referring Provider (PT): Pierre Part, Md   Encounter Date: 10/21/2020   PT End of Session - 10/21/20 1627     Visit Number 1    Number of Visits 15    Date for PT Re-Evaluation 12/16/20    Authorization Type MCR    PT Start Time 1430    PT Stop Time 1518    PT Time Calculation (min) 48 min    Activity Tolerance Patient tolerated treatment well    Behavior During Therapy [facility] for tasks assessed/performed             Past Medical History:  Diagnosis Date   Bradycardia    a. nocturnal with pauses overnight on tele likely due to OSA 10/2015.   Cancer ([place])    skin cancer   CKD (chronic kidney disease), stage III (HCC)    Coronary artery disease    a. prev nonobst. b. LHC 10/24/15: occlusion of small diffusely diseased OM2 s/p balloon angioplasty, mild nonobstructive disease of mLAD and mRCA, normal LVEDP   Diabetes mellitus without complication ([place])    Diabetic peripheral neuropathy ([place]) 10/04/2019   Diverticulosis    Esophageal reflux    Esophageal stricture from GERD 06/08/2011   With stricture at GE junction on EGD, dilated 06/2011    Gastric ulcer    on EGD 2013   Gout, unspecified    "on daily RX" (09/09/2017)   History of blood transfusion 02/1964   "when I had right little finger cut off"   Hyperlipidemia    Hypertension    Obesity, unspecified    Orthopnea    OSA on CPAP    Paroxysmal nocturnal dyspnea    Persistent atrial fibrillation ([place])    Vertigo     Past Surgical History:  Procedure Laterality Date   AMPUTATION Right 03/03/2019   Procedure: RIGHT 2ND TOE AMPUTATION;  Surgeon: [name], MD;  Location: [facility];  Service: Orthopedics;  Laterality: Right;   BALLOON DILATION N/A 10/22/2014   Procedure: BALLOON  DILATION;  Surgeon: [name], MD;  [facility];  Service: Endoscopy;  Laterality: N/A;   CARDIAC CATHETERIZATION  07/2008   CARDIAC CATHETERIZATION N/A 10/24/2015   Procedure: Left Heart Cath and Coronary Angiography;  Surgeon: [name], MD;  Location: [facility];  Service: Cardiovascular;  Laterality: N/A;   CARDIAC CATHETERIZATION N/A 10/24/2015   Procedure: Coronary Balloon Angioplasty;  Surgeon: [name], MD;  Location: [facility];  Service: Cardiovascular;  Laterality: N/A;   Carotid Dopplers  09/2009   no sig extracranial stenosis and vertebral arteries had antegrade flow   CATARACT EXTRACTION W/ INTRAOCULAR LENS  IMPLANT, BILATERAL Bilateral    ESOPHAGOGASTRODUODENOSCOPY (EGD) WITH ESOPHAGEAL DILATION     "I've had it stretched 3 times" (10/23/2015)   ESOPHAGOGASTRODUODENOSCOPY (EGD) WITH ESOPHAGEAL DILATION  06/2011   /medical hx above  (09/09/2017)   ESOPHAGOGASTRODUODENOSCOPY (EGD) WITH PROPOFOL N/A 10/22/2014   Procedure: ESOPHAGOGASTRODUODENOSCOPY (EGD) WITH PROPOFOL;  Surgeon: [name], MD;  [facility];  Service: Endoscopy;  Laterality: N/A;   FINGER SURGERY Right 02/1964   "cut little finger off; had it reattached"   I & D EXTREMITY Right 09/10/2017   Procedure: RIGHT FOOT DEBRIDEMENT;  Surgeon: [name], MD;  Location: [facility];  Service: Orthopedics;  Laterality: Right;   MRI of brain  09/2009   chronic microvascular ischemia   OSA Sleep Study     per Dr. Brett Fairy   SHOULDER ARTHROSCOPY W/ ROTATOR CUFF REPAIR Right 2008    There were no vitals filed for this visit.    Subjective Assessment - 10/21/20 1434     Subjective relays Rt sided back pain that come on all the sudden one month ago without known MOI. He has had 2 injections and muscle relaxors which has helped and he has not been very active and has been off balance.    Pertinent History PMH:CKD,DM,vertigo, OSA on CPAP    Diagnostic tests Lumbar MRI  "Moderate foraminal stenosis bilaterally at L3-4, L4-5, and L5-S1  is worse on the left at both levels.Edematous changes associated with superior endplate Schmorl'snodes at L4."    Patient Stated Goals reduce pain and improve balance    Currently in Pain? Yes    Pain Score 3    pain ranges 3-8   Pain Location Back    Pain Orientation Right    Pain Descriptors / Indicators Aching    Pain Type Acute pain    Pain Radiating Towards around to his ribcage, he denies N/T down his legs    Pain Onset More than a month ago    Pain Frequency Intermittent    Aggravating Factors  pain worse at night with sleeping, and twisting    Pain Relieving Factors rest and sitting in chair, meds and injecions                [facility] PT Assessment - 10/21/20 0001       Assessment   Medical Diagnosis M54.50 (ICD-10-CM) - Acute right-sided low back pain without sciatica    Referring Provider (PT) Hilts, Md    Onset Date/Surgical Date --   one month onset of pain   Next MD Visit not scheduled    Prior Therapy none      Precautions   Precautions Fall      Balance Screen   Has the patient fallen in the past 6 months No    Has the patient had a decrease in activity level because of a fear of falling?  Yes    Is the patient reluctant to leave their home because of a fear of falling?  Yes      [place] residence      Prior Function   Level of Independence Independent with basic ADLs    Vocation Retired      Associate Professor   Overall Cognitive Status Within Functional Limits for tasks assessed      Observation/Other Assessments   Observations hard of hearing    Focus on Therapeutic Outcomes (FOTO)  55%, goal 63%      Posture/Postural Control   Posture Comments Rt lateral trunk shift, anterior pelvic tilt      ROM / Strength   AROM / PROM / Strength AROM;Strength      AROM   AROM Assessment Site Lumbar    Lumbar Flexion 50%    Lumbar Extension 50%    Lumbar -  Right Side Bend 50%    Lumbar - Left Side Bend 50%    Lumbar - Right Rotation 25%    Lumbar - Left Rotation 25%      Strength   Overall Strength Comments leg strength WNL bilat in sitting, decreased core strength  Palpation   Palpation comment TTP in with tissue restrictions in Rt lumbar-thoracic parspinals      Special Tests   Other special tests mildly + Slump test on Rt that was neg on Lt, negative SLR test, + quadrant test      Transfers   Comments needs min A with supine to sit and sit to supine      Ambulation/Gait   Ambulation/Gait Yes    Ambulation/Gait Assistance 5: Supervision    Ambulation Distance (Feet) 100 Feet      Standardized Balance Assessment   Standardized Balance Assessment Berg Balance Test      Berg Balance Test   Sit to Stand Able to stand without using hands and stabilize independently    Standing Unsupported Able to stand 2 minutes with supervision    Sitting with Back Unsupported but Feet Supported on Floor or Stool Able to sit safely and securely 2 minutes    Stand to Sit Sits safely with minimal use of hands    Transfers Able to transfer safely, minor use of hands    Standing Unsupported with Eyes Closed Able to stand 3 seconds    Standing Unsupported with Feet Together Able to place feet together independently but unable to hold for 30 seconds    From Standing, Reach Forward with Outstretched Arm Reaches forward but needs supervision    From Standing Position, Pick up Object from Floor Able to pick up shoe, needs supervision    From Standing Position, Turn to Look Behind Over each Shoulder Turn sideways only but maintains balance    Turn 360 Degrees Able to turn 360 degrees safely but slowly    Standing Unsupported, Alternately Place Feet on Step/Stool Needs assistance to keep from falling or unable to try    Standing Unsupported, One Foot in [employer] balance while stepping or standing    Standing on One Leg Unable to try or needs assist to  prevent fall    Total Score 31                        Objective measurements completed on examination: See above findings.       [facility] Adult PT Treatment/Exercise - 10/21/20 0001       Ambulation/Gait   Gait Comments slower velocity and gait unsteadiness, recommending SPC and practiced gait training with this      Modalities   Modalities Electrical Stimulation      Electrical Stimulation   Electrical Stimulation Location Rt lumbar-thoracic    Electrical Stimulation Action IFC    Electrical Stimulation Parameters to tolerance    Electrical Stimulation Goals Pain      Manual Therapy   Manual therapy comments STM to Rt lumbar-thoracic                    PT Education - 10/21/20 1626     Education Details HEP,POC,TENS    Person(s) Educated Patient    Methods Explanation;Demonstration;Verbal cues;Handout    Comprehension Verbalized understanding              PT Short Term Goals - 10/21/20 1639       PT SHORT TERM GOAL #1   Title Pt will be I and compliant with HEP    Time 4    Period Weeks    Status New    Target Date 11/18/20  PT Long Term Goals - 10/21/20 1640       PT LONG TERM GOAL #1   Title Pt will improve FOTO to 63%    Time 8    Period Weeks    Status New      PT LONG TERM GOAL #2   Title Pt will improve lumbar ROM to [facility] to improve function    Time 8    Period Weeks    Status New      PT LONG TERM GOAL #3   Title Pt will improve BERG balance to >41 to show improved balance    Baseline 31    Time 8    Period Weeks    Status New      PT LONG TERM GOAL #4   Title Pt will report at least 50% improvement in overall pain with his usual activity    Time 8    Period Weeks    Status New                    Plan - 10/21/20 1630     Clinical Impression Statement Pt presents with Rt sided lumbar-thoracic pain, postural dysfunction and stenosis. Pain appears more myofascial or  facet/foraminal stenosis in nature. He also has unsteady gait and is at a high risk of falling based on his balance testing today. I am recommending SPC to use for ambulation to not only help with balance but this may also help relieve some of his back pain. He will benefit from skilled PT to address her functional deficits listed below.    Personal Factors and Comorbidities Comorbidity 3+    Comorbidities PMH:CKD,DM,vertigo, OSA on CPAP    Examination-Activity Limitations Bend;Carry;Lift;Stand;Stairs;Squat;Sleep;Locomotion Level;Transfers    Examination-Participation Restrictions Community Activity;Driving;Laundry;Yard Work;Shop    Stability/Clinical Decision Making Evolving/Moderate complexity    Clinical Decision Making Moderate    Rehab Potential Good    PT Frequency 2x / week    PT Duration 8 weeks    PT Treatment/Interventions ADLs/Self Care Home Management;Cryotherapy;Electrical Stimulation;Iontophoresis 4mg /ml Dexamethasone;Moist Heat;Traction;Ultrasound;Gait training;Therapeutic activities;Therapeutic exercise;Neuromuscular re-education;Balance training;Patient/family education;Manual techniques;Passive range of motion;Dry needling;Joint Manipulations;Spinal Manipulations;Taping    PT Next Visit Plan review and update HEP PRN, consider manual including DN, needs ROM and core strength and balance, work on gait with [facility]    PT Home Exercise Plan Access Code: [email or web address]             Patient will benefit from skilled therapeutic intervention in order to improve the following deficits and impairments:  Abnormal gait, Decreased activity tolerance, Decreased balance, Decreased endurance, Decreased coordination, Decreased mobility, Decreased range of motion, Decreased strength, Difficulty walking, Increased muscle spasms, Impaired flexibility, Improper body mechanics, Postural dysfunction, Pain  Visit Diagnosis: Acute right-sided low back pain without sciatica  Difficulty in walking, not  elsewhere classified  Abnormal posture     Problem List Patient Active Problem List   Diagnosis Date Noted   Lower urinary tract symptoms (LUTS) 09/16/2020   Creatinine elevation 07/31/2020   Drug-induced myopathy 07/31/2020   Diabetic polyneuropathy associated with type 2 diabetes mellitus ([place]) 06/20/2020   Gouty arthritis of left foot 06/20/2020   Diabetic peripheral neuropathy ([place]) 10/04/2019   Healthcare maintenance 05/22/2019   B12 deficiency 05/17/2019   Idiopathic chronic gout, unspecified site, without tophus (tophi) 24/26/8341   Acute diastolic heart failure (HCC)    CAD S/P percutaneous coronary angioplasty    Chronic atrial fibrillation (HCC)    CKD (chronic kidney disease), stage III (  [place])    Hypertension    Hyperlipidemia    Back pain 05/24/2015   Eustachian tube dysfunction 05/24/2015   Dysphagia, pharyngoesophageal phase 09/25/2014   History of esophageal stricture 09/25/2014   Chronic anticoagulation 09/25/2014   Esophageal stricture from GERD 06/08/2011   Advance care planning 04/20/2011   OSA on CPAP 09/22/2010   Gout 08/15/2008   OBESITY 08/15/2008   Essential hypertension 08/15/2008   CAD (coronary artery disease), native coronary artery 08/15/2008   GASTROESOPHAGEAL REFLUX DISEASE 08/15/2008    [name] 10/21/2020, 4:47 PM  [facility] Physical Therapy [address] [place], [place], [address] Phone: [phone]   Fax:  [phone]  Name: [name] MRN: [id number] Date of Birth: [birth year]

## 2020-10-21 NOTE — Patient Instructions (Signed)
Access Code: [email or web address] URL: [email or web address] Date: 10/21/2020 Prepared by: Elsie Ra  Exercises Seated Lumbar Flexion Stretch - 2 x daily - 6 x weekly - 1 sets - 10 reps - 10 hold Supine Posterior Pelvic Tilt - 2 x daily - 6 x weekly - 2 sets - 10 reps - 5 hold Supine Lower Trunk Rotation - 2 x daily - 6 x weekly - 1 sets - 10 reps - 5 sec hold Hooklying Single Knee to Chest Stretch - 2 x daily - 6 x weekly - 1 sets - 2 reps - 20 hold Standing Row with Anchored Resistance - 2 x daily - 6 x weekly - 2-3 sets - 10-20 reps Standing Shoulder Extension with Resistance - 2 x daily - 6 x weekly - 2-3 sets - 10-20 reps Half Tandem Stance Balance with Eyes Closed - 2 x daily - 6 x weekly - 1 sets - 3 reps - 30 sec hold

## 2020-10-23 ENCOUNTER — Ambulatory Visit (INDEPENDENT_AMBULATORY_CARE_PROVIDER_SITE_OTHER): Payer: Medicare Other | Admitting: Physical Therapy

## 2020-10-23 ENCOUNTER — Other Ambulatory Visit: Payer: Self-pay

## 2020-10-23 DIAGNOSIS — R293 Abnormal posture: Secondary | ICD-10-CM

## 2020-10-23 DIAGNOSIS — R262 Difficulty in walking, not elsewhere classified: Secondary | ICD-10-CM | POA: Diagnosis not present

## 2020-10-23 DIAGNOSIS — M545 Low back pain, unspecified: Secondary | ICD-10-CM | POA: Diagnosis not present

## 2020-10-23 NOTE — Therapy (Signed)
[facility] Physical Therapy [address] [place], [place], [address] Phone: [phone]   Fax:  [phone]  Physical Therapy Treatment  Patient Details  Name: [name] MRN: [id number] Date of Birth: [birth year] Referring Provider (PT): [place], Md   Encounter Date: 10/23/2020   PT End of Session - 10/23/20 1106     Visit Number 2    Number of Visits 15    Date for PT Re-Evaluation 12/16/20    Authorization Type MCR    PT Start Time 1015    PT Stop Time 1106    PT Time Calculation (min) 51 min    Activity Tolerance Patient tolerated treatment well    Behavior During Therapy [facility] for tasks assessed/performed             Past Medical History:  Diagnosis Date   Bradycardia    a. nocturnal with pauses overnight on tele likely due to OSA 10/2015.   Cancer ([place])    skin cancer   CKD (chronic kidney disease), stage III (HCC)    Coronary artery disease    a. prev nonobst. b. LHC 10/24/15: occlusion of small diffusely diseased OM2 s/p balloon angioplasty, mild nonobstructive disease of mLAD and mRCA, normal LVEDP   Diabetes mellitus without complication ([place])    Diabetic peripheral neuropathy ([place]) 10/04/2019   Diverticulosis    Esophageal reflux    Esophageal stricture from GERD 06/08/2011   With stricture at GE junction on EGD, dilated 06/2011    Gastric ulcer    on EGD 2013   Gout, unspecified    "on daily RX" (09/09/2017)   History of blood transfusion 02/1964   "when I had right little finger cut off"   Hyperlipidemia    Hypertension    Obesity, unspecified    Orthopnea    OSA on CPAP    Paroxysmal nocturnal dyspnea    Persistent atrial fibrillation ([place])    Vertigo     Past Surgical History:  Procedure Laterality Date   AMPUTATION Right 03/03/2019   Procedure: RIGHT 2ND TOE AMPUTATION;  Surgeon: [name], MD;  Location: [place];  Service: Orthopedics;  Laterality: Right;   BALLOON DILATION N/A 10/22/2014   Procedure: BALLOON  DILATION;  Surgeon: [name], MD;  [facility];  Service: Endoscopy;  Laterality: N/A;   CARDIAC CATHETERIZATION  07/2008   CARDIAC CATHETERIZATION N/A 10/24/2015   Procedure: Left Heart Cath and Coronary Angiography;  Surgeon: [name], MD;  Location: [place] [facility];  Service: Cardiovascular;  Laterality: N/A;   CARDIAC CATHETERIZATION N/A 10/24/2015   Procedure: Coronary Balloon Angioplasty;  Surgeon: [name], MD;  Location: [place] [facility];  Service: Cardiovascular;  Laterality: N/A;   Carotid Dopplers  09/2009   no sig extracranial stenosis and vertebral arteries had antegrade flow   CATARACT EXTRACTION W/ INTRAOCULAR LENS  IMPLANT, BILATERAL Bilateral    ESOPHAGOGASTRODUODENOSCOPY (EGD) WITH ESOPHAGEAL DILATION     "I've had it stretched 3 times" (10/23/2015)   ESOPHAGOGASTRODUODENOSCOPY (EGD) WITH ESOPHAGEAL DILATION  06/2011   /medical hx above  (09/09/2017)   ESOPHAGOGASTRODUODENOSCOPY (EGD) WITH PROPOFOL N/A 10/22/2014   Procedure: ESOPHAGOGASTRODUODENOSCOPY (EGD) WITH PROPOFOL;  Surgeon: [name], MD;  [facility];  Service: Endoscopy;  Laterality: N/A;   FINGER SURGERY Right 02/1964   "cut little finger off; had it reattached"   I & D EXTREMITY Right 09/10/2017   Procedure: RIGHT FOOT DEBRIDEMENT;  Surgeon: [name], MD;  Location: [place];  Service: Orthopedics;  Laterality: Right;   MRI of brain  09/2009   chronic microvascular ischemia   OSA Sleep Study     per Dr. Brett Fairy   SHOULDER ARTHROSCOPY W/ ROTATOR CUFF REPAIR Right 2008    There were no vitals filed for this visit.   Subjective Assessment - 10/23/20 1036     Subjective relays the pain has already improved from his last session. The sharp pain is no longer there and it is more of a dull ache, He was also able to ride in the car for a long ride without increased pain.    Pertinent History PMH:CKD,DM,vertigo, OSA on CPAP    Diagnostic tests Lumbar MRI "Moderate  foraminal stenosis bilaterally at L3-4, L4-5, and L5-S1  is worse on the left at both levels.Edematous changes associated with superior endplate Schmorl'snodes at L4."    Patient Stated Goals reduce pain and improve balance    Pain Onset More than a month ago                               [facility] Adult PT Treatment/Exercise - 10/23/20 0001       Ambulation/Gait   Ambulation/Gait Yes    Ambulation/Gait Assistance 5: Supervision    Ambulation Distance (Feet) 150 Feet   X2   Assistive device Straight cane   quad tip     Exercises   Exercises Lumbar      Lumbar Exercises: Stretches   Other Lumbar Stretch Exercise seated pball lumbar flexion and thoracic extension roll outs 10 sec X 10 in front and X 10 diagonal to his left (to stretch Rt)    Other Lumbar Stretch Exercise seated thoracic rotation stretch holding Pball 5 sec X 10 bilat      Lumbar Exercises: Aerobic   Nustep L6 X 8 min UE/LE      Lumbar Exercises: Standing   Row 20 reps    Theraband Level (Row) Level 3 (Green)    Shoulder Extension 20 reps    Theraband Level (Shoulder Extension) Level 3 (Green)    Other Standing Lumbar Exercises mod tandem balance 30 sec X 3, balance normal width apart with eyes closed 10 sec  X5      Lumbar Exercises: Seated   Other Seated Lumbar Exercises bilat Horizontal abd green X15      Manual Therapy   Manual therapy comments STM to Rt lumbar-thoracic, compression and palpation during DN              Trigger Point Dry Needling - 10/23/20 0001     Consent Given? Yes    Education Handout Provided Yes    Muscles Treated Back/Hip Erector spinae;Lumbar multifidi    Dry Needling Comments good tolerance without pain or immediate adverse reaction                    PT Short Term Goals - 10/21/20 1639       PT SHORT TERM GOAL #1   Title Pt will be I and compliant with HEP    Time 4    Period Weeks    Status New    Target Date 11/18/20                PT Long Term Goals - 10/21/20 1640       PT LONG TERM GOAL #1   Title Pt will improve FOTO to 63%  Time 8    Period Weeks    Status New      PT LONG TERM GOAL #2   Title Pt will improve lumbar ROM to [facility] to improve function    Time 8    Period Weeks    Status New      PT LONG TERM GOAL #3   Title Pt will improve BERG balance to >41 to show improved balance    Baseline 31    Time 8    Period Weeks    Status New      PT LONG TERM GOAL #4   Title Pt will report at least 50% improvement in overall pain with his usual activity    Time 8    Period Weeks    Status New                   Plan - 10/23/20 1106     Clinical Impression Statement he arrives with [facility] and has improved posture and gait stability with this even though he does not like using the cane just yet. We worked to improve his overall posture and tightness in his lumbar-thoracic paraspinals with manual therapy and stretching with good overall tolerance. He is showing good overall progress with pain reduction and we will also work to improve balance and gait as well.    Personal Factors and Comorbidities Comorbidity 3+    Comorbidities PMH:CKD,DM,vertigo, OSA on CPAP    Examination-Activity Limitations Bend;Carry;Lift;Stand;Stairs;Squat;Sleep;Locomotion Level;Transfers    Examination-Participation Restrictions Community Activity;Driving;Laundry;Yard Work;Shop    Stability/Clinical Decision Making Evolving/Moderate complexity    Rehab Potential Good    PT Frequency 2x / week    PT Duration 8 weeks    PT Treatment/Interventions ADLs/Self Care Home Management;Cryotherapy;Electrical Stimulation;Iontophoresis 4mg /ml Dexamethasone;Moist Heat;Traction;Ultrasound;Gait training;Therapeutic activities;Therapeutic exercise;Neuromuscular re-education;Balance training;Patient/family education;Manual techniques;Passive range of motion;Dry needling;Joint Manipulations;Spinal Manipulations;Taping    PT Next Visit  Plan how was DN?  consider manual including DN, needs ROM and core strength and balance, work on gait with [facility]    PT Home Exercise Plan Access Code: [email or web address]             Patient will benefit from skilled therapeutic intervention in order to improve the following deficits and impairments:  Abnormal gait, Decreased activity tolerance, Decreased balance, Decreased endurance, Decreased coordination, Decreased mobility, Decreased range of motion, Decreased strength, Difficulty walking, Increased muscle spasms, Impaired flexibility, Improper body mechanics, Postural dysfunction, Pain  Visit Diagnosis: Acute right-sided low back pain without sciatica  Abnormal posture  Difficulty in walking, not elsewhere classified     Problem List Patient Active Problem List   Diagnosis Date Noted   Lower urinary tract symptoms (LUTS) 09/16/2020   Creatinine elevation 07/31/2020   Drug-induced myopathy 07/31/2020   Diabetic polyneuropathy associated with type 2 diabetes mellitus ([place]) 06/20/2020   Gouty arthritis of left foot 06/20/2020   Diabetic peripheral neuropathy ([place]) 10/04/2019   Healthcare maintenance 05/22/2019   B12 deficiency 05/17/2019   Idiopathic chronic gout, unspecified site, without tophus (tophi) 09/40/7680   Acute diastolic heart failure (HCC)    CAD S/P percutaneous coronary angioplasty    Chronic atrial fibrillation (HCC)    CKD (chronic kidney disease), stage III ([place])    Hypertension    Hyperlipidemia    Back pain 05/24/2015   Eustachian tube dysfunction 05/24/2015   Dysphagia, pharyngoesophageal phase 09/25/2014   History of esophageal stricture 09/25/2014   Chronic anticoagulation 09/25/2014   Esophageal stricture from GERD 06/08/2011   Advance care  planning 04/20/2011   OSA on CPAP 09/22/2010   Gout 08/15/2008   OBESITY 08/15/2008   Essential hypertension 08/15/2008   CAD (coronary artery disease), native coronary artery 08/15/2008   GASTROESOPHAGEAL REFLUX  DISEASE 08/15/2008    Silvestre Mesi 10/23/2020, 11:09 AM  [facility] Physical Therapy [address] [place], [place], [address] Phone: [phone]   Fax:  [phone]  Name: [name] MRN: [id number] Date of Birth: 11-21-1930

## 2020-10-25 ENCOUNTER — Other Ambulatory Visit: Payer: Self-pay | Admitting: Cardiovascular Disease

## 2020-10-25 ENCOUNTER — Other Ambulatory Visit: Payer: Self-pay | Admitting: Family Medicine

## 2020-10-25 DIAGNOSIS — M109 Gout, unspecified: Secondary | ICD-10-CM

## 2020-10-25 DIAGNOSIS — U071 COVID-19: Secondary | ICD-10-CM | POA: Diagnosis not present

## 2020-10-28 ENCOUNTER — Telehealth: Payer: Self-pay | Admitting: *Deleted

## 2020-10-28 NOTE — Telephone Encounter (Signed)
Patient with diagnosis of afib on warfarin for anticoagulation.    Procedure: 7 or more teeth to be extracted Date of procedure: 10/29/20  CHA2DS2-VASc Score = 6  This indicates a 9.7% annual risk of stroke. The patient's score is based upon: CHF History: Yes HTN History: Yes Diabetes History: Yes Stroke History: No Vascular Disease History: Yes Age Score: 2 Gender Score: 0  CrCl 70mL/min using adjusted body weight Platelet count 136K  Patient does not require pre-op antibiotics for dental procedure.  Per office protocol, patient can hold warfarin for 5 days prior to procedure.   Patient will not need bridging with Lovenox (enoxaparin) around procedure.  Since not bridging, patient should restart warfarin on the evening of procedure or day after, at discretion of procedure MD

## 2020-10-28 NOTE — Telephone Encounter (Addendum)
Pt's daughter called and stated that pt started holding his warfarin on Saturday because he needs a tooth extracted on 8/16. She stated that they held the warfarin on there own but that the dentist office stated they did the right thing.   Pt daughter is requesting we send over a medical clearance form to Dr. Janyth Contes office,  the doctor who is doing the procedure because he needs that form to do the procedure. Dr. Levonne Spiller office fax machine is broken. Daughter stated that she is coming to office to drop of the form.   Instructed daughter that when pt resumes his warfarin he will need to take an extra 1/2 tablet with his normal dose for two days when the Dr. says it is okay for pt to resume warfarin.   Best contact number for the daughter is [phone]  Form was dropped off to the office and I gave the form to Julaine Hua who is helping with pre op.

## 2020-10-28 NOTE — Telephone Encounter (Signed)
   [place] Pre-operative Risk Assessment    Patient Name: [name]  DOB: 1931/03/03 MRN: [id number]  HEARTCARE STAFF:  - IMPORTANT!!!!!! Under Visit Info/Reason for Call, type in Other and utilize the format Clearance MM/DD/YY or Clearance TBD. Do not use dashes or single digits. - Please review there is not already an duplicate clearance open for this procedure. - If request is for dental extraction, please clarify the # of teeth to be extracted. - If the patient is currently at the dentist's office, call Pre-Op Callback Staff (MA/nurse) to input urgent request.  - If the patient is not currently in the dentist office, please route to the Pre-Op pool.  Request for surgical clearance:  What type of surgery is being performed? 7 TEETH  OR MORE TO BE EXTRACTED  When is this surgery scheduled? 10/29/20  What type of clearance is required (medical clearance vs. Pharmacy clearance to hold med vs. Both)? BOTH  Are there any medications that need to be held prior to surgery and how long?  WARFARIN; SEE NOTES FROM [facility] RN, PT HAS ALREADY STARTED HOLDING BLOOD THINNER AS OF Saturday 10/26/20  Practice name and name of physician performing surgery? [facility]; DR. Elberta Spaniel  What is the office phone number? [phone]   7.   What is the office fax number? [phone]; THOUGH NEED TO EMAIL CLEARANCE TO: [email or web address]  8.   Anesthesia type (None, local, MAC, general) ? LOCAL   Julaine Hua 10/28/2020, 11:00 AM  _________________________________________________________________   (provider comments below)

## 2020-10-28 NOTE — Telephone Encounter (Signed)
   Name: [name]  DOB: 19-Apr-1930  MRN: [id number]   Primary Cardiologist: Sherren Mocha, MD  Chart reviewed as part of pre-operative protocol coverage. Patient was contacted 10/28/2020 in reference to pre-operative risk assessment for pending surgery as outlined below.  [name] was last seen on 08/15/20 by Dr. Burt Knack.  [name] informs me that he has just returned home after having a tooth pulled by his dentist today. He is not anticipating any more teeth to be pulled at this time. He is planning to restart his coumadin tonight as instructed by his dentist and the coumadin clinic.   I will remove from pre-op pool at this time.   Pharmacy, please disregard request below - asked and answered. Thanks!  Abigail Butts, PA-C 10/28/2020, 4:52 PM

## 2020-10-29 ENCOUNTER — Encounter: Payer: Medicare Other | Admitting: Physical Therapy

## 2020-10-30 ENCOUNTER — Ambulatory Visit (INDEPENDENT_AMBULATORY_CARE_PROVIDER_SITE_OTHER): Payer: Medicare Other

## 2020-10-30 ENCOUNTER — Other Ambulatory Visit: Payer: Self-pay

## 2020-10-30 DIAGNOSIS — E538 Deficiency of other specified B group vitamins: Secondary | ICD-10-CM

## 2020-10-30 MED ORDER — CYANOCOBALAMIN 1000 MCG/ML IJ SOLN
1000.0000 ug | Freq: Once | INTRAMUSCULAR | Status: AC
  Administered 2020-10-30: 1000 ug via INTRAMUSCULAR

## 2020-10-30 NOTE — Progress Notes (Signed)
Patient presented for B 12 injection given by Rahmir Beever, CMA to right deltoid, patient voiced no concerns nor showed any signs of distress during injection.  

## 2020-11-01 ENCOUNTER — Other Ambulatory Visit: Payer: Self-pay

## 2020-11-01 ENCOUNTER — Encounter: Payer: Self-pay | Admitting: Family Medicine

## 2020-11-01 ENCOUNTER — Ambulatory Visit (INDEPENDENT_AMBULATORY_CARE_PROVIDER_SITE_OTHER): Payer: Medicare Other | Admitting: Family Medicine

## 2020-11-01 DIAGNOSIS — M25522 Pain in left elbow: Secondary | ICD-10-CM | POA: Diagnosis not present

## 2020-11-01 MED ORDER — PREDNISONE 10 MG PO TABS: ORAL_TABLET | ORAL | 0 refills | Status: DC

## 2020-11-01 MED ORDER — CLINDAMYCIN HCL 300 MG PO CAPS: 300.0000 mg | ORAL_CAPSULE | Freq: Three times a day (TID) | ORAL | 0 refills | Status: DC

## 2020-11-01 NOTE — Progress Notes (Signed)
Gout in left elbow Started a couple days ago On gout medication-allopurinol

## 2020-11-01 NOTE — Progress Notes (Signed)
Office Visit Note   Patient: [name]           Date of Birth: December 02, 1930           MRN: [id number] Visit Date: 11/01/2020 Requested by: Tonia Ghent, MD [address] [place],  [place] [address] PCP: Tonia Ghent, MD  Subjective: Chief Complaint  Patient presents with   Left Elbow - Pain    HPI: He is here with left elbow pain and swelling.  Symptoms started a few days ago.  Swelling first, then redness and pain.  Yesterday he had a fever of 100.7.  COVID test was negative.              ROS:   All other systems were reviewed and are negative.  Objective: Vital Signs: There were no vitals taken for this visit.  Physical Exam:  General:  Alert and oriented, in no acute distress. Pulm:  Breathing unlabored. Psy:  Normal mood, congruent affect. Skin: There is erythema around the elbow in an area of approximately 12 cm diameter.  A photo was taken for the chart. Left elbow: He has 1-2+ swelling of the olecranon bursa, no drainage, no obvious skin breakdown.  Imaging: No results found.  Assessment & Plan: Left elbow pain and swelling, possible gout but cannot rule out septic olecranon bursitis -We will treat with clindamycin.  Prednisone Dosepak.  Follow-up next week for recheck.  Urgent care or ER if he gets worse over the weekend.     Procedures: No procedures performed        PMFS History: Patient Active Problem List   Diagnosis Date Noted   Lower urinary tract symptoms (LUTS) 09/16/2020   Creatinine elevation 07/31/2020   Drug-induced myopathy 07/31/2020   Diabetic polyneuropathy associated with type 2 diabetes mellitus ([place]) 06/20/2020   Gouty arthritis of left foot 06/20/2020   Diabetic peripheral neuropathy ([place]) 10/04/2019   Healthcare maintenance 05/22/2019   B12 deficiency 05/17/2019   Idiopathic chronic gout, unspecified site, without tophus (tophi) 72/53/6644   Acute diastolic heart failure (HCC)    CAD S/P percutaneous coronary  angioplasty    Chronic atrial fibrillation (HCC)    CKD (chronic kidney disease), stage III ([place])    Hypertension    Hyperlipidemia    Back pain 05/24/2015   Eustachian tube dysfunction 05/24/2015   Dysphagia, pharyngoesophageal phase 09/25/2014   History of esophageal stricture 09/25/2014   Chronic anticoagulation 09/25/2014   Esophageal stricture from GERD 06/08/2011   Advance care planning 04/20/2011   OSA on CPAP 09/22/2010   Gout 08/15/2008   OBESITY 08/15/2008   Essential hypertension 08/15/2008   CAD (coronary artery disease), native coronary artery 08/15/2008   GASTROESOPHAGEAL REFLUX DISEASE 08/15/2008   Past Medical History:  Diagnosis Date   Bradycardia    a. nocturnal with pauses overnight on tele likely due to OSA 10/2015.   Cancer ([place])    skin cancer   CKD (chronic kidney disease), stage III (HCC)    Coronary artery disease    a. prev nonobst. b. LHC 10/24/15: occlusion of small diffusely diseased OM2 s/p balloon angioplasty, mild nonobstructive disease of mLAD and mRCA, normal LVEDP   Diabetes mellitus without complication (HCC)    Diabetic peripheral neuropathy ([place]) 10/04/2019   Diverticulosis    Esophageal reflux    Esophageal stricture from GERD 06/08/2011   With stricture at GE junction on EGD, dilated 06/2011    Gastric ulcer    on EGD  2013   Gout, unspecified    "on daily RX" (09/09/2017)   History of blood transfusion 02/1964   "when I had right little finger cut off"   Hyperlipidemia    Hypertension    Obesity, unspecified    Orthopnea    OSA on CPAP    Paroxysmal nocturnal dyspnea    Persistent atrial fibrillation (HCC)    Vertigo     Family History  Problem Relation Age of Onset   Stomach cancer Mother        died in 36's    Hypertension Father        died in his 39's pna likely dementia   Dementia Father    Pneumonia Father    Heart disease Brother        S/P CABG   Hypertension Other    Prostate cancer Neg Hx    Colon cancer Neg Hx      Past Surgical History:  Procedure Laterality Date   AMPUTATION Right 03/03/2019   Procedure: RIGHT 2ND TOE AMPUTATION;  Surgeon: [name], MD;  Location: [place];  Service: Orthopedics;  Laterality: Right;   BALLOON DILATION N/A 10/22/2014   Procedure: BALLOON DILATION;  Surgeon: [name], MD;  [facility];  Service: Endoscopy;  Laterality: N/A;   CARDIAC CATHETERIZATION  07/2008   CARDIAC CATHETERIZATION N/A 10/24/2015   Procedure: Left Heart Cath and Coronary Angiography;  Surgeon: [name], MD;  Location: [place] [facility];  Service: Cardiovascular;  Laterality: N/A;   CARDIAC CATHETERIZATION N/A 10/24/2015   Procedure: Coronary Balloon Angioplasty;  Surgeon: [name], MD;  Location: [place] [facility];  Service: Cardiovascular;  Laterality: N/A;   Carotid Dopplers  09/2009   no sig extracranial stenosis and vertebral arteries had antegrade flow   CATARACT EXTRACTION W/ INTRAOCULAR LENS  IMPLANT, BILATERAL Bilateral    ESOPHAGOGASTRODUODENOSCOPY (EGD) WITH ESOPHAGEAL DILATION     "I've had it stretched 3 times" (10/23/2015)   ESOPHAGOGASTRODUODENOSCOPY (EGD) WITH ESOPHAGEAL DILATION  06/2011   /medical hx above  (09/09/2017)   ESOPHAGOGASTRODUODENOSCOPY (EGD) WITH PROPOFOL N/A 10/22/2014   Procedure: ESOPHAGOGASTRODUODENOSCOPY (EGD) WITH PROPOFOL;  Surgeon: [name], MD;  [facility];  Service: Endoscopy;  Laterality: N/A;   FINGER SURGERY Right 02/1964   "cut little finger off; had it reattached"   I & D EXTREMITY Right 09/10/2017   Procedure: RIGHT FOOT DEBRIDEMENT;  Surgeon: [name], MD;  Location: [place];  Service: Orthopedics;  Laterality: Right;   MRI of brain  09/2009   chronic microvascular ischemia   OSA Sleep Study     per Dr. 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

## 2020-11-04 ENCOUNTER — Telehealth: Payer: Self-pay

## 2020-11-04 DIAGNOSIS — H524 Presbyopia: Secondary | ICD-10-CM | POA: Diagnosis not present

## 2020-11-04 DIAGNOSIS — H5203 Hypermetropia, bilateral: Secondary | ICD-10-CM | POA: Diagnosis not present

## 2020-11-04 DIAGNOSIS — H52223 Regular astigmatism, bilateral: Secondary | ICD-10-CM | POA: Diagnosis not present

## 2020-11-04 DIAGNOSIS — H04203 Unspecified epiphora, bilateral lacrimal glands: Secondary | ICD-10-CM | POA: Diagnosis not present

## 2020-11-04 NOTE — Telephone Encounter (Signed)
   ROHITH FAUTH DOB: 03/06/1931 MRN: [id number]   RIDER WAIVER AND RELEASE OF LIABILITY  For purposes of improving physical access to our facilities, [facility] is pleased to partner with third parties to provide [place] patients or other authorized individuals the option of convenient, on-demand ground transportation services (the Technical brewer") through use of the technology service that enables users to request on-demand ground transportation from independent third-party providers.  By opting to use and accept these [employer], I, the undersigned, hereby agree on behalf of myself, and on behalf of any minor child using the Government social research officer for whom I am the parent or legal guardian, as follows:  Government social research officer provided to me are provided by independent third-party transportation providers who are not [employer] or employees and who are unaffiliated with [employer]. [place] is neither a transportation carrier nor a common or public carrier. [place] has no control over the quality or safety of the transportation that occurs as a result of the [employer]. [facility] cannot guarantee that any third-party transportation provider will complete any arranged transportation service. [facility] makes no representation, warranty, or guarantee regarding the reliability, timeliness, quality, safety, suitability, or availability of any of the Transport Services or that they will be error free. I fully understand that traveling by vehicle involves risks and dangers of serious bodily injury, including permanent disability, paralysis, and death. I agree, on behalf of myself and on behalf of any minor child using the Transport Services for whom I am the parent or legal guardian, that the entire risk arising out of my use of the [employer] remains solely with me, to the maximum extent permitted under applicable law. The [employer] are provided "as  is" and "as available." [facility] disclaims all representations and warranties, express, implied or statutory, not expressly set out in these terms, including the implied warranties of merchantability and fitness for a particular purpose. I hereby waive and release [place], its agents, employees, officers, directors, representatives, insurers, attorneys, assigns, successors, subsidiaries, and affiliates from any and all past, present, or future claims, demands, liabilities, actions, causes of action, or suits of any kind directly or indirectly arising from acceptance and use of the [employer]. I further waive and release [place] and its affiliates from all present and future liability and responsibility for any injury or death to persons or damages to property caused by or related to the use of the [employer]. I have read this Waiver and Release of Liability, and I understand the terms used in it and their legal significance. This Waiver is freely and voluntarily given with the understanding that my right (as well as the right of any minor child for whom I am the parent or legal guardian using the [employer]) to legal recourse against [place] in connection with the [employer] is knowingly surrendered in return for use of these services.   I attest that I read the consent document to [name], gave Mr. Proch the opportunity to ask questions and answered the questions asked (if any). I affirm that [name] then provided consent for he's participation in this program.     Drucie Ip

## 2020-11-06 ENCOUNTER — Ambulatory Visit (INDEPENDENT_AMBULATORY_CARE_PROVIDER_SITE_OTHER): Payer: Medicare Other | Admitting: Physical Therapy

## 2020-11-06 ENCOUNTER — Other Ambulatory Visit: Payer: Self-pay

## 2020-11-06 ENCOUNTER — Ambulatory Visit (INDEPENDENT_AMBULATORY_CARE_PROVIDER_SITE_OTHER): Payer: Medicare Other | Admitting: Otolaryngology

## 2020-11-06 DIAGNOSIS — R293 Abnormal posture: Secondary | ICD-10-CM

## 2020-11-06 DIAGNOSIS — M545 Low back pain, unspecified: Secondary | ICD-10-CM

## 2020-11-06 DIAGNOSIS — R262 Difficulty in walking, not elsewhere classified: Secondary | ICD-10-CM | POA: Diagnosis not present

## 2020-11-06 NOTE — Therapy (Addendum)
[facility] [address] [place], [place], [address] Phone: [phone]   Fax:  [phone]  Physical Therapy Treatment/ Discharge  Patient Details  Name: [name] MRN: [id number] Date of Birth: 09-11-1930 Referring Provider (PT): [place], [place]   Encounter Date: 11/06/2020   PT End of Session - 11/06/20 1508     Visit Number 3    Number of Visits 15    Date for PT Re-Evaluation 12/16/20    Authorization Type MCR    PT Start Time 3825    PT Stop Time 1428    PT Time Calculation (min) 43 min    Activity Tolerance Patient tolerated treatment well    Behavior During Therapy [facility] for tasks assessed/performed             Past Medical History:  Diagnosis Date   Bradycardia    a. nocturnal with pauses overnight on tele likely due to OSA 10/2015.   Cancer ([place])    skin cancer   CKD (chronic kidney disease), stage III (HCC)    Coronary artery disease    a. prev nonobst. b. LHC 10/24/15: occlusion of small diffusely diseased OM2 s/p balloon angioplasty, mild nonobstructive disease of mLAD and mRCA, normal LVEDP   Diabetes mellitus without complication ([place])    Diabetic peripheral neuropathy ([place]) 10/04/2019   Diverticulosis    Esophageal reflux    Esophageal stricture from GERD 06/08/2011   With stricture at GE junction on EGD, dilated 06/2011    Gastric ulcer    on EGD 2013   Gout, unspecified    "on daily RX" (09/09/2017)   History of blood transfusion 02/1964   "when I had right little finger cut off"   Hyperlipidemia    Hypertension    Obesity, unspecified    Orthopnea    OSA on CPAP    Paroxysmal nocturnal dyspnea    Persistent atrial fibrillation ([place])    Vertigo     Past Surgical History:  Procedure Laterality Date   AMPUTATION Right 03/03/2019   Procedure: RIGHT 2ND TOE AMPUTATION;  Surgeon: [name], MD;  Location: [place];  Service: Orthopedics;  Laterality: Right;   BALLOON DILATION N/A 10/22/2014   Procedure:  BALLOON DILATION;  Surgeon: [name], MD;  [facility];  Service: Endoscopy;  Laterality: N/A;   CARDIAC CATHETERIZATION  07/2008   CARDIAC CATHETERIZATION N/A 10/24/2015   Procedure: Left Heart Cath and Coronary Angiography;  Surgeon: [name], MD;  Location: [facility];  Service: Cardiovascular;  Laterality: N/A;   CARDIAC CATHETERIZATION N/A 10/24/2015   Procedure: Coronary Balloon Angioplasty;  Surgeon: [name], MD;  Location: [place] [facility];  Service: Cardiovascular;  Laterality: N/A;   Carotid Dopplers  09/2009   no sig extracranial stenosis and vertebral arteries had antegrade flow   CATARACT EXTRACTION W/ INTRAOCULAR LENS  IMPLANT, BILATERAL Bilateral    ESOPHAGOGASTRODUODENOSCOPY (EGD) WITH ESOPHAGEAL DILATION     "I've had it stretched 3 times" (10/23/2015)   ESOPHAGOGASTRODUODENOSCOPY (EGD) WITH ESOPHAGEAL DILATION  06/2011   /medical hx above  (09/09/2017)   ESOPHAGOGASTRODUODENOSCOPY (EGD) WITH PROPOFOL N/A 10/22/2014   Procedure: ESOPHAGOGASTRODUODENOSCOPY (EGD) WITH PROPOFOL;  Surgeon: [name], MD;  [facility];  Service: Endoscopy;  Laterality: N/A;   FINGER SURGERY Right 02/1964   "cut little finger off; had it reattached"   I & D EXTREMITY Right 09/10/2017   Procedure: RIGHT FOOT DEBRIDEMENT;  Surgeon: [name], MD;  Location: [facility];  Service: Orthopedics;  Laterality: Right;   MRI of brain  09/2009   chronic microvascular ischemia   OSA Sleep Study     per Dr. Brett Fairy   SHOULDER ARTHROSCOPY W/ ROTATOR CUFF REPAIR Right 2008    There were no vitals filed for this visit.   Subjective Assessment - 11/06/20 1508     Subjective relays his back is not hurthing nearly as much, he felt the DN helped and he is no longer sore and tender like it was.    Pertinent History PMH:CKD,DM,vertigo, OSA on CPAP    Diagnostic tests Lumbar MRI "Moderate foraminal stenosis bilaterally at L3-4, L4-5, and L5-S1  is worse on the left  at both levels.Edematous changes associated with superior endplate Schmorl'snodes at L4."    Patient Stated Goals reduce pain and improve balance    Pain Onset More than a month ago                [facility] PT Assessment - 11/06/20 0001       Assessment   Medical Diagnosis M54.50 (ICD-10-CM) - Acute right-sided low back pain without sciatica    Referring Provider (PT) Hilts, Md      AROM   Overall AROM Comments only has "pull" with left side bend, denies pain with any plane    Lumbar Flexion 75%    Lumbar Extension 50%    Lumbar - Right Side Bend 50%    Lumbar - Left Side Bend 50%    Lumbar - Right Rotation 505    Lumbar - Left Rotation 50%                           [facility] Adult PT Treatment/Exercise - 11/06/20 0001       Ambulation/Gait   Ambulation/Gait Yes    Ambulation/Gait Assistance 5: Supervision    Ambulation Distance (Feet) 150 Feet   X2   Assistive device --   quad tip cane     Lumbar Exercises: Stretches   Other Lumbar Stretch Exercise seated pball lumbar flexion and thoracic extension roll outs 10 sec X 10 in front and X 10 diagonal to his left (to stretch Rt)    Other Lumbar Stretch Exercise seated thoracic rotation stretch holding Pball 5 sec X 10 bilat      Lumbar Exercises: Aerobic   Nustep L6 X 10 min UE/LE      Lumbar Exercises: Machines for Strengthening   Other Lumbar Machine Exercise row machine 55# 2X10, lat pull machine 35# 2X10      Lumbar Exercises: Standing   Other Standing Lumbar Exercises tandem balance 30 sec X 3, retro walking at counter top 3 round trips      Lumbar Exercises: Seated   Other Seated Lumbar Exercises bilat Horizontal abd, bilat ER both green X20      Manual Therapy   Manual therapy comments STM to Rt lumbar-thoracic, compression and palpation during DN              Trigger Point Dry Needling - 11/06/20 0001     Consent Given? Yes    Muscles Treated Back/Hip Erector spinae;Thoracic  multifidi;Quadratus lumborum    Dry Needling Comments good tolerance without pain or immediate adverse reaction                    PT Short Term Goals - 11/06/20 1512       PT SHORT TERM GOAL #  1   Title Pt will be I and compliant with HEP    Time 4    Period Weeks    Status Achieved    Target Date 11/18/20               PT Long Term Goals - 11/06/20 1512       PT LONG TERM GOAL #1   Title Pt will improve FOTO to 63%    Time 8    Period Weeks    Status On-going      PT LONG TERM GOAL #2   Title Pt will improve lumbar ROM to [facility] to improve function    Time 8    Period Weeks    Status On-going      PT LONG TERM GOAL #3   Title Pt will improve BERG balance to >41 to show improved balance    Baseline 31    Time 8    Period Weeks    Status On-going      PT LONG TERM GOAL #4   Title Pt will report at least 50% improvement in overall pain with his usual activity    Time 8    Period Weeks    Status On-going                   Plan - 11/06/20 1509     Clinical Impression Statement He has made good early progress with PT and has less overall pain and improved lumbar-thoracic rotation. He has had good results from DN. He does still lack some mobility and overall balance but has been compliant using SPC now. PT recommending to continue with current PT plan to maximize his function.    Personal Factors and Comorbidities Comorbidity 3+    Comorbidities PMH:CKD,DM,vertigo, OSA on CPAP    Examination-Activity Limitations Bend;Carry;Lift;Stand;Stairs;Squat;Sleep;Locomotion Level;Transfers    Examination-Participation Restrictions Community Activity;Driving;Laundry;Yard Work;Shop    Stability/Clinical Decision Making Evolving/Moderate complexity    Rehab Potential Good    PT Frequency 2x / week    PT Duration 8 weeks    PT Treatment/Interventions ADLs/Self Care Home Management;Cryotherapy;Electrical Stimulation;Iontophoresis 95m/ml Dexamethasone;Moist  Heat;Traction;Ultrasound;Gait training;Therapeutic activities;Therapeutic exercise;Neuromuscular re-education;Balance training;Patient/family education;Manual techniques;Passive range of motion;Dry needling;Joint Manipulations;Spinal Manipulations;Taping    PT Next Visit Plan what did MD say? , needs ROM and core strength and balance[facility]   PT Home Exercise Plan Access Code: [email or web address]            Patient will benefit from skilled therapeutic intervention in order to improve the following deficits and impairments:  Abnormal gait, Decreased activity tolerance, Decreased balance, Decreased endurance, Decreased coordination, Decreased mobility, Decreased range of motion, Decreased strength, Difficulty walking, Increased muscle spasms, Impaired flexibility, Improper body mechanics, Postural dysfunction, Pain  Visit Diagnosis: Acute right-sided low back pain without sciatica  Abnormal posture  Difficulty in walking, not elsewhere classified     Problem List Patient Active Problem List   Diagnosis Date Noted   Lower urinary tract symptoms (LUTS) 09/16/2020   Creatinine elevation 07/31/2020   Drug-induced myopathy 07/31/2020   Diabetic polyneuropathy associated with type 2 diabetes mellitus ([place] 06/20/2020   Gouty arthritis of left foot 06/20/2020   Diabetic peripheral neuropathy ([place] 10/04/2019   Healthcare maintenance 05/22/2019   B12 deficiency 05/17/2019   Idiopathic chronic gout, unspecified site, without tophus (tophi) 096/28/3662  Acute diastolic heart failure (HCC)    CAD S/P percutaneous coronary angioplasty    Chronic atrial fibrillation ([place]  CKD (chronic kidney disease), stage III ([place])    Hypertension    Hyperlipidemia    Back pain 05/24/2015   Eustachian tube dysfunction 05/24/2015   Dysphagia, pharyngoesophageal phase 09/25/2014   History of esophageal stricture 09/25/2014   Chronic anticoagulation 09/25/2014   Esophageal stricture from GERD  06/08/2011   Advance care planning 04/20/2011   OSA on CPAP 09/22/2010   Gout 08/15/2008   OBESITY 08/15/2008   Essential hypertension 08/15/2008   CAD (coronary artery disease), native coronary artery 08/15/2008   GASTROESOPHAGEAL REFLUX DISEASE 08/15/2008    Marrianne Mood Salinda Snedeker,PT,DPT 11/06/2020, 3:13 PM  PHYSICAL THERAPY DISCHARGE SUMMARY  Visits from Start of Care: 3  Current functional level related to goals / functional outcomes: See note   Remaining deficits: See note   Education / Equipment: HEP   Patient agrees to discharge. Patient goals were partially met. Patient is being discharged due to not returning since the last visit.  Scot Jun, PT, DPT, OCS, ATC 12/25/20  9:21 AM     [facility] [address] [place], [place], [address] Phone: [phone]   Fax:  [phone]  Name: [name] MRN: [id number] Date of Birth: 1930-09-03

## 2020-11-07 ENCOUNTER — Encounter: Payer: Self-pay | Admitting: Family Medicine

## 2020-11-07 ENCOUNTER — Telehealth: Payer: Self-pay | Admitting: Neurology

## 2020-11-07 ENCOUNTER — Ambulatory Visit (INDEPENDENT_AMBULATORY_CARE_PROVIDER_SITE_OTHER): Payer: Medicare Other | Admitting: Family Medicine

## 2020-11-07 DIAGNOSIS — M545 Low back pain, unspecified: Secondary | ICD-10-CM | POA: Diagnosis not present

## 2020-11-07 DIAGNOSIS — M25522 Pain in left elbow: Secondary | ICD-10-CM

## 2020-11-07 NOTE — Telephone Encounter (Signed)
Called wife back. He still has old bipap machine. [employer] changed settings to 17/12cm (new order that was placed 08/14/20 by Dr. Brett Fairy for new machine). This is because he has not received new machine yet. Does not want Luna, only resmed. I placed on hold and spoke with MD. Dot Been per MD, she would like him to keep appt 12/05/20 w/ MM,NP to see how download on current machine looks with new settings while he waits to get set up with new machine. I made wife aware that they will need to bring machine/power cord w/ them to appt because we cannot pull data off resmed/airview website. She verbalized understanding and appreciation.

## 2020-11-07 NOTE — Telephone Encounter (Signed)
Sent email to [employer] to get update on this pt. Waiting on response.

## 2020-11-07 NOTE — Telephone Encounter (Signed)
Daughter is asking if pt needs the upcoming appointment since he doesn't have a new bi-pap, its just the DME has changed the settings on the old one, please call.

## 2020-11-07 NOTE — Telephone Encounter (Signed)
Received message back from Campbell Owens/[employer]: "Looks like the office offered a Luna machine and they said that they would like to wait on the ResMed. That was on 07/07. I will reach out to the local office and see if they have got any ResMed's in." Waiting on response from [place]

## 2020-11-07 NOTE — Progress Notes (Signed)
Office Visit Note   Patient: [name]           Date of Birth: 1930/05/06           MRN: [id number] Visit Date: 11/07/2020 Requested by: Tonia Ghent, MD [place],  [place] [id number] PCP: Tonia Ghent, MD  Subjective: Chief Complaint  Patient presents with   Left Elbow - Follow-up, Pain    Still taking clindamycin and prednisone. The elbow is no longer sore.   Lower Back - Pain, Follow-up    Had PT yesterday. Back is feeling better. Continue?    HPI: He is here for follow-up left elbow olecranon bursitis as well as low back pain.  Since last visit he has been taking prednisone and clindamycin.  His elbow feels dramatically better.  He has been to 3 sessions of physical therapy for dry needling and it has helped significantly with his low back pain.  He is very pleased with how he feels.                ROS:   All other systems were reviewed and are negative.  Objective: Vital Signs: There were no vitals taken for this visit.  Physical Exam:  General:  Alert and oriented, in no acute distress. Pulm:  Breathing unlabored. Psy:  Normal mood, congruent affect. Skin: There is no further erythema around the left elbow, no warmth to touch.  He has nodularity of the olecranon bursa, I suspect a tophus. Low back: Much less tightness in the right lumbar paraspinous muscles.   Imaging: No results found.  Assessment & Plan: Clinically healing left elbow olecranon bursitis -Finish antibiotics and prednisone.  Follow-up as needed.  2.  Myofascial low back pain, significantly improved. -Home exercises.  Resume physical therapy in the future if needed.     Procedures: No procedures performed        PMFS History: Patient Active Problem List   Diagnosis Date Noted   Lower urinary tract symptoms (LUTS) 09/16/2020   Creatinine elevation 07/31/2020   Drug-induced myopathy 07/31/2020   Diabetic polyneuropathy associated with type 2 diabetes  mellitus ([place]) 06/20/2020   Gouty arthritis of left foot 06/20/2020   Diabetic peripheral neuropathy ([place]) 10/04/2019   Healthcare maintenance 05/22/2019   B12 deficiency 05/17/2019   Idiopathic chronic gout, unspecified site, without tophus (tophi) 79/89/2119   Acute diastolic heart failure (HCC)    CAD S/P percutaneous coronary angioplasty    Chronic atrial fibrillation (HCC)    CKD (chronic kidney disease), stage III ([place])    Hypertension    Hyperlipidemia    Back pain 05/24/2015   Eustachian tube dysfunction 05/24/2015   Dysphagia, pharyngoesophageal phase 09/25/2014   History of esophageal stricture 09/25/2014   Chronic anticoagulation 09/25/2014   Esophageal stricture from GERD 06/08/2011   Advance care planning 04/20/2011   OSA on CPAP 09/22/2010   Gout 08/15/2008   OBESITY 08/15/2008   Essential hypertension 08/15/2008   CAD (coronary artery disease), native coronary artery 08/15/2008   GASTROESOPHAGEAL REFLUX DISEASE 08/15/2008   Past Medical History:  Diagnosis Date   Bradycardia    a. nocturnal with pauses overnight on tele likely due to OSA 10/2015.   Cancer ([place])    skin cancer   CKD (chronic kidney disease), stage III (HCC)    Coronary artery disease    a. prev nonobst. b. LHC 10/24/15: occlusion of small diffusely diseased OM2 s/p balloon angioplasty, mild nonobstructive disease of mLAD  and mRCA, normal LVEDP   Diabetes mellitus without complication (HCC)    Diabetic peripheral neuropathy ([place]) 10/04/2019   Diverticulosis    Esophageal reflux    Esophageal stricture from GERD 06/08/2011   With stricture at GE junction on EGD, dilated 06/2011    Gastric ulcer    on EGD 2013   Gout, unspecified    "on daily RX" (09/09/2017)   History of blood transfusion 02/1964   "when I had right little finger cut off"   Hyperlipidemia    Hypertension    Obesity, unspecified    Orthopnea    OSA on CPAP    Paroxysmal nocturnal dyspnea    Persistent atrial fibrillation  (HCC)    Vertigo     Family History  Problem Relation Age of Onset   Stomach cancer Mother        died in 23's    Hypertension Father        died in his 52's pna likely dementia   Dementia Father    Pneumonia Father    Heart disease Brother        S/P CABG   Hypertension Other    Prostate cancer Neg Hx    Colon cancer Neg Hx     Past Surgical History:  Procedure Laterality Date   AMPUTATION Right 03/03/2019   Procedure: RIGHT 2ND TOE AMPUTATION;  Surgeon: [name], MD;  Location: [facility];  Service: Orthopedics;  Laterality: Right;   BALLOON DILATION N/A 10/22/2014   Procedure: BALLOON DILATION;  Surgeon: [name], MD;  [facility];  Service: Endoscopy;  Laterality: N/A;   CARDIAC CATHETERIZATION  07/2008   CARDIAC CATHETERIZATION N/A 10/24/2015   Procedure: Left Heart Cath and Coronary Angiography;  Surgeon: [name], MD;  Location: [facility];  Service: Cardiovascular;  Laterality: N/A;   CARDIAC CATHETERIZATION N/A 10/24/2015   Procedure: Coronary Balloon Angioplasty;  Surgeon: [name], MD;  Location: [place] [facility];  Service: Cardiovascular;  Laterality: N/A;   Carotid Dopplers  09/2009   no sig extracranial stenosis and vertebral arteries had antegrade flow   CATARACT EXTRACTION W/ INTRAOCULAR LENS  IMPLANT, BILATERAL Bilateral    ESOPHAGOGASTRODUODENOSCOPY (EGD) WITH ESOPHAGEAL DILATION     "I've had it stretched 3 times" (10/23/2015)   ESOPHAGOGASTRODUODENOSCOPY (EGD) WITH ESOPHAGEAL DILATION  06/2011   /medical hx above  (09/09/2017)   ESOPHAGOGASTRODUODENOSCOPY (EGD) WITH PROPOFOL N/A 10/22/2014   Procedure: ESOPHAGOGASTRODUODENOSCOPY (EGD) WITH PROPOFOL;  Surgeon: [name], MD;  [facility];  Service: Endoscopy;  Laterality: N/A;   FINGER SURGERY Right 02/1964   "cut little finger off; had it reattached"   I & D EXTREMITY Right 09/10/2017   Procedure: RIGHT FOOT DEBRIDEMENT;  Surgeon: [name], MD;  Location:  [facility];  Service: Orthopedics;  Laterality: Right;   MRI of brain  09/2009   chronic microvascular ischemia   OSA Sleep Study     per Dr. 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

## 2020-11-08 ENCOUNTER — Other Ambulatory Visit: Payer: Self-pay

## 2020-11-08 ENCOUNTER — Encounter: Payer: Medicare Other | Admitting: Rehabilitative and Restorative Service Providers"

## 2020-11-08 ENCOUNTER — Ambulatory Visit (INDEPENDENT_AMBULATORY_CARE_PROVIDER_SITE_OTHER): Payer: Medicare Other | Admitting: *Deleted

## 2020-11-08 DIAGNOSIS — Z5181 Encounter for therapeutic drug level monitoring: Secondary | ICD-10-CM

## 2020-11-08 DIAGNOSIS — I4819 Other persistent atrial fibrillation: Secondary | ICD-10-CM

## 2020-11-08 LAB — POCT INR: INR: 2 (ref 2.0–3.0)

## 2020-11-08 NOTE — Patient Instructions (Signed)
Description   Continue on same dosage of warfarin 1 tablet daily except 1.5 tablets on Sundays and Thursdays. Recheck INR in 1 week. Call Coumadin clinic for any changes in medications or up coming procedures. [phone].

## 2020-11-11 ENCOUNTER — Encounter: Payer: Medicare Other | Admitting: Physical Therapy

## 2020-11-13 ENCOUNTER — Encounter: Payer: Medicare Other | Admitting: Physical Therapy

## 2020-11-15 ENCOUNTER — Ambulatory Visit (INDEPENDENT_AMBULATORY_CARE_PROVIDER_SITE_OTHER): Payer: Medicare Other | Admitting: *Deleted

## 2020-11-15 ENCOUNTER — Other Ambulatory Visit: Payer: Self-pay

## 2020-11-15 DIAGNOSIS — I4819 Other persistent atrial fibrillation: Secondary | ICD-10-CM

## 2020-11-15 DIAGNOSIS — Z5181 Encounter for therapeutic drug level monitoring: Secondary | ICD-10-CM

## 2020-11-15 LAB — POCT INR: INR: 2.5 (ref 2.0–3.0)

## 2020-11-15 NOTE — Patient Instructions (Signed)
Description   Continue taking Warfarin 1 tablet daily except 1.5 tablets on Sundays and Thursdays. Recheck INR in 4 weeks. Call Coumadin clinic for any changes in medications or up coming procedures. [phone].

## 2020-11-21 ENCOUNTER — Telehealth: Payer: Self-pay

## 2020-11-21 NOTE — Chronic Care Management (AMB) (Addendum)
Chronic Care Management Pharmacy Assistant   Name: [name]  MRN: [id number] DOB: 1930/04/03  Reason for Encounter: General Adherence   Recent office visits:  09/13/20-PCP-Patient presented for follow up back pain.Reasonable to try Flonase and gently perform Valsalva and update me as needed.Try taking flomax and see if your urine stream is better.   Recent consult visits:  11/07/20-Orthopedics-Patient presented for follow up of left elbow pain.Doing physical therapy, no medication changes 11/01/20-Orthopedics-Patient presented with left elbow pain and swelling.-We will treat with clindamycin.  Prednisone Dosepak.  Follow-up next week for recheck.  Urgent care or ER if he gets worse over the weekend. 10/16/20-Orthopedics-Patient presented for low back pain. Trigger point injection 5cc 0.25% bupivacaine only.Physical Therapy and referral to pain specialist. 09/20/20-Orthopedics-Patient presented with low back pain.Trigger point injection 1% Lidocaine and betamethasone 6mg .  Hospital visits:  09/17/20-[facility] ED-Patient presented for low back pain radiating into his chest and abomen.Labs ordered(UA abnormal)(CBC abnormal)Glucose elevated-No admission   Medications: Outpatient Encounter Medications as of 11/21/2020  Medication Sig   acetaminophen (TYLENOL) 500 MG tablet Take 1,000 mg by mouth as needed for mild pain.   allopurinol (ZYLOPRIM) 100 MG tablet TAKE 1 TABLET BY MOUTH EVERY DAY   baclofen (LIORESAL) 10 MG tablet TAKE 0.5-1 TABLETS (5-10 MG TOTAL) BY MOUTH AT BEDTIME AS NEEDED FOR MUSCLE SPASMS.   clindamycin (CLEOCIN) 300 MG capsule Take 1 capsule (300 mg total) by mouth 3 (three) times daily.   cyanocobalamin (,VITAMIN B-12,) 1000 MCG/ML injection 101mcg IM every 28 days   diltiazem (CARDIZEM CD) 180 MG 24 hr capsule TAKE 1 CAPSULE BY MOUTH EVERY DAY   fluticasone (FLONASE) 50 MCG/ACT nasal spray Place 2 sprays into both nostrils daily.   furosemide (LASIX) 20 MG  tablet TAKE 1 TABLET BY MOUTH DAILY. PLEASE MAKE YEARLY APPT WITH DR. Burt Knack FOR MAY BEFORE ANYMORE REFILLS   lidocaine (LIDODERM) 5 % Place 1 patch onto the skin daily. Remove & Discard patch within 12 hours or as directed by MD   lisinopril (ZESTRIL) 10 MG tablet Take 1 tablet (10 mg total) by mouth daily.   metoprolol tartrate (LOPRESSOR) 25 MG tablet TAKE 1 TABLET BY MOUTH TWICE A DAY   omeprazole (PRILOSEC) 40 MG capsule TAKE 1 CAPSULE BY MOUTH EVERY DAY   predniSONE (DELTASONE) 10 MG tablet Take as directed for 12 days.  Daily dose 6,6,5,5,4,4,3,3,2,2,1,1.   tamsulosin (FLOMAX) 0.4 MG CAPS capsule Take 1 capsule (0.4 mg total) by mouth daily.   traMADol (ULTRAM) 50 MG tablet Take 1 tablet (50 mg total) by mouth every 6 (six) hours as needed.   warfarin (COUMADIN) 4 MG tablet Take 1 to 1 and 1/2 tablets by mouth daily or as directed by [facility]mad[facility]   No facility-administered encounter medications on file as of 11/21/2020.    Contacted [name] on 11/26/20 for general disease state and medication adherence call.   Patient is not > 5 days past due for refill on the following medications per chart history:  [facility] Medications: Medication Name/mg Last Fill Days Supply Lisinopril 10mg   09/11/20 90  What concerns do you have about your medications? The patient states he has some worries about the allopurinol and his kidneys.  The patient denies side effects with his medications.   How often do you forget or accidentally miss a dose? Never  Do you use a pillbox? Yes  The patient puts out medications every Saturday for the week  Are you having any  problems getting your medications from your pharmacy? [facility] seems to be doing well  Has the cost of your medications been a concern? No  Since last visit with CPP, no interventions have been made.  The patient has not had an ED visit since last contact.   The patient reports the following and denies problems with their  health.   he denies  concerns or questions for Debbora Dus, Pharm. D at this time.   Counseled patient on:  [place] job taking medications, Importance of taking medication daily without missed doses, Benefits of adherence packaging or a pillbox, and Access to CCM team for any cost, medication or pharmacy concerns.   Care Gaps: Annual wellness visit in last year? Yes Most Recent BP reading: 162/92 102-P (09/17/20)  If Diabetic: Most recent A1C reading: 7.2  (07/23/20) Last eye exam / retinopathy screening:05/26/19 Last diabetic foot exam: 06/20/20  PCP appointment on 11/23/20- Phone AWV   Debbora Dus, CPP notified  Avel Sensor, [place] Assistant [phone]  I have reviewed the care management and care coordination activities outlined in this encounter and I am certifying that I agree with the content of this note. No further action required.  Debbora Dus, PharmD Clinical Pharmacist [facility] at [facility] [phone]

## 2020-11-23 ENCOUNTER — Ambulatory Visit (INDEPENDENT_AMBULATORY_CARE_PROVIDER_SITE_OTHER): Payer: Medicare Other

## 2020-11-23 DIAGNOSIS — Z0000 Encounter for general adult medical examination without abnormal findings: Secondary | ICD-10-CM | POA: Diagnosis not present

## 2020-11-23 NOTE — Progress Notes (Signed)
Subjective:   [name] is a 87 y.o. male who presents for Medicare Annual/Subsequent preventive examination. I connected with  [name] on 11/23/20 by a audio enabled telemedicine application and verified that I am speaking with the correct person using two identifiers.   I discussed the limitations of evaluation and management by telemedicine. The patient expressed understanding and agreed to proceed. Location of patient: Home Location of provider: Office Persons participating in visit [name] (patient) Beckie Salts, [place].   Review of Systems    Defer to PCP.       Objective:    There were no vitals filed for this visit. There is no height or weight on file to calculate BMI.  Advanced Directives 05/16/2019 02/17/2019 07/09/2018 09/09/2017 02/13/2016 11/08/2015 11/08/2015  Does Patient Have a Medical Advance Directive? No Yes No Yes No No No  Type of Advance Directive - Healthcare Power of [place]  Does patient want to make changes to medical advance directive? - No - Patient declined - No - Patient declined - - -  Copy of [place] in Chart? - - - No - copy requested - - -  Would patient like information on creating a medical advance directive? No - Patient declined - No - Guardian declined - - No - patient declined information No - patient declined information    Current Medications (verified) Outpatient Encounter Medications as of 11/23/2020  Medication Sig   acetaminophen (TYLENOL) 500 MG tablet Take 1,000 mg by mouth as needed for mild pain.   allopurinol (ZYLOPRIM) 100 MG tablet TAKE 1 TABLET BY MOUTH EVERY DAY   baclofen (LIORESAL) 10 MG tablet TAKE 0.5-1 TABLETS (5-10 MG TOTAL) BY MOUTH AT BEDTIME AS NEEDED FOR MUSCLE SPASMS.   clindamycin (CLEOCIN) 300 MG capsule Take 1 capsule (300 mg total) by mouth 3 (three) times daily.   cyanocobalamin (,VITAMIN B-12,) 1000 MCG/ML injection 10101mcg IM every 28 days    diltiazem (CARDIZEM CD) 180 MG 24 hr capsule TAKE 1 CAPSULE BY MOUTH EVERY DAY   fluticasone (FLONASE) 50 MCG/ACT nasal spray Place 2 sprays into both nostrils daily.   furosemide (LASIX) 20 MG tablet TAKE 1 TABLET BY MOUTH DAILY. PLEASE MAKE YEARLY APPT WITH DR. Burt Knack FOR MAY BEFORE ANYMORE REFILLS   lidocaine (LIDODERM) 5 % Place 1 patch onto the skin daily. Remove & Discard patch within 12 hours or as directed by MD   lisinopril (ZESTRIL) 10 MG tablet Take 1 tablet (10 mg total) by mouth daily.   metoprolol tartrate (LOPRESSOR) 25 MG tablet TAKE 1 TABLET BY MOUTH TWICE A DAY   omeprazole (PRILOSEC) 40 MG capsule TAKE 1 CAPSULE BY MOUTH EVERY DAY   predniSONE (DELTASONE) 10 MG tablet Take as directed for 12 days.  Daily dose 6,6,5,5,4,4,3,3,2,2,1,1.   tamsulosin (FLOMAX) 0.4 MG CAPS capsule Take 1 capsule (0.4 mg total) by mouth daily.   traMADol (ULTRAM) 50 MG tablet Take 1 tablet (50 mg total) by mouth every 6 (six) hours as needed.   warfarin (COUMADIN) 4 MG tablet Take 1 to 1 and 1/2 tablets by mouth daily or as directed by Coumadin Clinic   No facility-administered encounter medications on file as of 11/23/2020.    Allergies (verified) Penicillins, Atorvastatin, Pravastatin, Colchicine, Hydrocodone, Nsaids, Uloric [febuxostat], Vancomycin, Biaxin [clarithromycin], and Doxycycline   History: Past Medical History:  Diagnosis Date   Bradycardia    a. nocturnal with pauses overnight on tele  likely due to OSA 10/2015.   Cancer ([place])    skin cancer   CKD (chronic kidney disease), stage III (HCC)    Coronary artery disease    a. prev nonobst. b. LHC 10/24/15: occlusion of small diffusely diseased OM2 s/p balloon angioplasty, mild nonobstructive disease of mLAD and mRCA, normal LVEDP   Diabetes mellitus without complication ([place])    Diabetic peripheral neuropathy ([place]) 10/04/2019   Diverticulosis    Esophageal reflux    Esophageal stricture from GERD 06/08/2011   With stricture at GE  junction on EGD, dilated 06/2011    Gastric ulcer    on EGD 2013   Gout, unspecified    "on daily RX" (09/09/2017)   History of blood transfusion 02/1964   "when I had right little finger cut off"   Hyperlipidemia    Hypertension    Obesity, unspecified    Orthopnea    OSA on CPAP    Paroxysmal nocturnal dyspnea    Persistent atrial fibrillation ([place])    Vertigo    Past Surgical History:  Procedure Laterality Date   AMPUTATION Right 03/03/2019   Procedure: RIGHT 2ND TOE AMPUTATION;  Surgeon: [name], MD;  Location: [facility];  Service: Orthopedics;  Laterality: Right;   BALLOON DILATION N/A 10/22/2014   Procedure: BALLOON DILATION;  Surgeon: [name], MD;  Location: [facility];  Service: Endoscopy;  Laterality: N/A;   CARDIAC CATHETERIZATION  07/2008   CARDIAC CATHETERIZATION N/A 10/24/2015   Procedure: Left Heart Cath and Coronary Angiography;  Surgeon: [name], MD;  Location: [facility];  Service: Cardiovascular;  Laterality: N/A;   CARDIAC CATHETERIZATION N/A 10/24/2015   Procedure: Coronary Balloon Angioplasty;  Surgeon: [name], MD;  Location: [facility];  Service: Cardiovascular;  Laterality: N/A;   Carotid Dopplers  09/2009   no sig extracranial stenosis and vertebral arteries had antegrade flow   CATARACT EXTRACTION W/ INTRAOCULAR LENS  IMPLANT, BILATERAL Bilateral    ESOPHAGOGASTRODUODENOSCOPY (EGD) WITH ESOPHAGEAL DILATION     "I've had it stretched 3 times" (10/23/2015)   ESOPHAGOGASTRODUODENOSCOPY (EGD) WITH ESOPHAGEAL DILATION  06/2011   /medical hx above  (09/09/2017)   ESOPHAGOGASTRODUODENOSCOPY (EGD) WITH PROPOFOL N/A 10/22/2014   Procedure: ESOPHAGOGASTRODUODENOSCOPY (EGD) WITH PROPOFOL;  Surgeon: [name], MD;  [facility];  Service: Endoscopy;  Laterality: N/A;   FINGER SURGERY Right 02/1964   "cut little finger off; had it reattached"   I & D EXTREMITY Right 09/10/2017   Procedure: RIGHT FOOT DEBRIDEMENT;   Surgeon: [name], MD;  Location: [facility];  Service: Orthopedics;  Laterality: Right;   MRI of brain  09/2009   chronic microvascular ischemia   OSA Sleep Study     per Dr. 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  Tobacco Use   Smoking status: Former    Packs/day: 0.12    Years: 4.00    Pack years: 0.48    Types: Cigarettes    Quit date: 03/16/1969    Years since quitting: 51.7   Smokeless tobacco: Former   Tobacco comments:    "chewed when I smoked; smoked 1 pack/week for about 4 years, quit 1971  Vaping Use   Vaping Use: Never used  Substance and Sexual Activity   Alcohol use: Not Currently    Alcohol/week: 0.0 standard drinks    Comment: 09/09/2017  "Quit all alcohol in 1970's"   Drug use: Never   Sexual activity: Not on file  Other Topics Concern   Not on file  Social History Narrative   Lives in [place], married 1953, widowed after 48 years   Retired Administrator, still does Biomedical scientist.   He is not routinely exercising.   Daily caffeine    Social Determinants of Health   Financial Resource Strain: Low Risk    Difficulty of Paying Living Expenses: Not very hard  Food Insecurity: Not on file  Transportation Needs: Not on file  Physical Activity: Not on file  Stress: Not on file  Social Connections: Not on file    Tobacco  Counseling Counseling given: Not Answered Tobacco comments: "chewed when I smoked; smoked 1 pack/week for about 4 years, quit 1971   Clinical Intake:                 Diabetic?No         Activities of Daily Living No flowsheet data found.  Patient Care Team: Tonia Ghent, MD as PCP - Cyndia Diver, MD as PCP - Cardiology (Cardiology) Sherren Mocha, MD (Cardiology) Elsie Saas, MD as Consulting Physician (Orthopedic Surgery) Debbora Dus, [facility] as Pharmacist (Pharmacist) Gean Quint, MD as Consulting Physician (Nephrology)  Indicate any recent Medical Services you may have received from other than [facility] providers in the past year (date may be approximate).     Assessment:   This is a routine wellness examination for [place].  Hearing/Vision screen No results found.  Dietary issues and exercise activities discussed:     Goals Addressed   None    Depression Screen PHQ 2/9 Scores 05/16/2019 03/30/2019 02/13/2016 12/17/2014 08/22/2013 06/23/2012  PHQ - 2 Score 0 0 0 0 0 0  PHQ- 9 Score 0 - - - - -    Fall Risk Fall Risk  06/20/2020 05/16/2019 03/30/2019 10/14/2017 02/20/2016  Falls in the past year? 0 0 0 No Yes  Comment - - - [facility] Telephone Survey: data to providers prior to load [employer] Telephone Survey: data to providers prior to load  Number falls in past yr: - 0 0 - 2 or more  Comment - - - - [facility] Telephone Survey Actual Response = 2  Injury with Fall? - 0 - - No  Risk for fall due to : - Medication side effect - - -  Follow up - Falls evaluation completed;Falls prevention discussed Falls evaluation completed - -    FALL RISK PREVENTION PERTAINING TO THE HOME:  Any stairs in or around the home? No  If so, are there any without handrails? No  Home free of loose throw rugs in walkways, pet beds, electrical cords, etc? Yes  Adequate lighting in your home to reduce risk of falls? Yes   ASSISTIVE DEVICES UTILIZED TO PREVENT FALLS:  Life alert? No   Use of a cane, walker or w/c? Yes  Grab bars in the bathroom? Yes  Shower chair or bench in shower? Yes  Elevated toilet seat or a handicapped toilet? Yes   TIMED UP AND GO:  Was the test performed? No .      Cognitive Function: MMSE - Mini Mental State Exam 05/16/2019 02/13/2016  Orientation to time 5 5  Orientation to Place 5 5  Registration 3 3  Attention/ Calculation 5 0  Recall 2 3  Language- name 2 objects - 0  Language- repeat 1 1  Language- follow 3 step command - 3  Language- read & follow direction - 0  Write a sentence - 0  Copy design - 0  Total score - 20        Immunizations Immunization History  Administered Date(s) Administered   Influenza, High Dose Seasonal PF 12/15/2016, 12/19/2018   Influenza, Seasonal, Injecte, Preservative Fre 12/14/2013   Influenza,inj,Quad PF,6+ Mos 12/17/2014, 01/29/2018   Influenza-Unspecified 12/15/2016, 12/19/2018   Moderna Sars-Covid-2 Vaccination 04/11/2019, 05/09/2019   Pneumococcal Conjugate-13 12/17/2014   Pneumococcal Polysaccharide-23 09/19/2009   Td 09/19/2009   Zoster, Live 09/19/2009    TDAP status: Due, Education has been provided regarding the importance of this vaccine. Advised may receive this vaccine at local pharmacy or Health Dept. Aware to provide a copy of the vaccination record if obtained from local pharmacy or Health Dept. Verbalized acceptance and understanding.  Flu Vaccine status: Due, Education has been provided regarding the importance of this vaccine. Advised may receive this vaccine at local pharmacy or Health Dept. Aware to provide a copy of the vaccination record if obtained from local pharmacy or Health Dept. Verbalized acceptance and understanding.  Pneumococcal vaccine status: Up to date  Covid-19 vaccine status: Completed vaccines  Qualifies for Shingles Vaccine? Yes   Zostavax completed No   Shingrix Completed?: No.    Education has been provided regarding the importance of this  vaccine. Patient has been advised to call insurance company to determine out of pocket expense if they have not yet received this vaccine. Advised may also receive vaccine at local pharmacy or Health Dept. Verbalized acceptance and understanding.  Screening Tests Health Maintenance  Topic Date Due   Zoster Vaccines- Shingrix (1 of 2) Never done   TETANUS/TDAP  09/20/2019   COVID-19 Vaccine (3 - Booster for Moderna series) 10/06/2019   FOOT EXAM  05/18/2020   OPHTHALMOLOGY EXAM  05/25/2020   INFLUENZA VACCINE  10/14/2020   HEMOGLOBIN A1C  01/23/2021   PNA vac Low Risk Adult  Completed   HPV VACCINES  Aged Out    Health Maintenance  Health Maintenance Due  Topic Date Due   Zoster Vaccines- Shingrix (1 of 2) Never done   TETANUS/TDAP  09/20/2019   COVID-19 Vaccine (3 - Booster for Moderna series) 10/06/2019   FOOT EXAM  05/18/2020   OPHTHALMOLOGY EXAM  05/25/2020   INFLUENZA VACCINE  10/14/2020    Colorectal cancer screening: Referral to GI placed NO. Pt aware the office will call re: appt.  Lung Cancer Screening: (Low Dose CT Chest recommended if Age 28-80 years, 30 pack-year currently smoking OR have quit w/in 15years.) does qualify.   Lung Cancer Screening Referral: NO  Additional Screening:  Hepatitis C Screening: does not qualify; Completed No  Vision Screening: Recommended annual ophthalmology exams for  early detection of glaucoma and other disorders of the eye. Is the patient up to date with their annual eye exam?  Yes  Who is the provider or what is the name of the office in which the patient attends annual eye exams? 1 year If pt is not established with a provider, would they like to be referred to a provider to establish care? No .   Dental Screening: Recommended annual dental exams for proper oral hygiene  Community Resource Referral / Chronic Care Management: CRR required this visit?  No   CCM required this visit?  No      Plan:     I have personally  reviewed and noted the following in the patient's chart:   Medical and social history Use of alcohol, tobacco or illicit drugs  Current medications and supplements including opioid prescriptions. Patient is not currently taking opioid prescriptions. Functional ability and status Nutritional status Physical activity Advanced directives List of other physicians Hospitalizations, surgeries, and ER visits in previous 12 months Vitals Screenings to include cognitive, depression, and falls Referrals and appointments  In addition, I have reviewed and discussed with patient certain preventive protocols, quality metrics, and best practice recommendations. A written personalized care plan for preventive services as well as general preventive health recommendations were provided to patient.     Beckie Salts, [place]   11/23/2020   Nurse Notes: Non face to face 60 minute visit.   Mr. Korinek , Thank you for taking time to come for your Medicare Wellness Visit. I appreciate your ongoing commitment to your health goals. Please review the following plan we discussed and let me know if I can assist you in the future.   These are the goals we discussed:  Goals      Increase physical activity     Starting 02/13/2016, I will continue to exercise at least 30 min daily.      Patient Stated     05/16/2019, I will maintain and continue medications as prescribed.         This is a list of the screening recommended for you and due dates:  Health Maintenance  Topic Date Due   Zoster (Shingles) Vaccine (1 of 2) Never done   Tetanus Vaccine  09/20/2019   COVID-19 Vaccine (3 - Booster for Moderna series) 10/06/2019   Complete foot exam   05/18/2020   Eye exam for diabetics  05/25/2020   Flu Shot  10/14/2020   Hemoglobin A1C  01/23/2021   Pneumonia vaccines  Completed   HPV Vaccine  Aged Out

## 2020-11-23 NOTE — Patient Instructions (Signed)
Health Maintenance, Male Adopting a healthy lifestyle and getting preventive care are important in promoting health and wellness. Ask your health care provider about: The right schedule for you to have regular tests and exams. Things you can do on your own to prevent diseases and keep yourself healthy. What should I know about diet, weight, and exercise? Eat a healthy diet  Eat a diet that includes plenty of vegetables, fruits, low-fat dairy products, and lean protein. Do not eat a lot of foods that are high in solid fats, added sugars, or sodium. Maintain a healthy weight Body mass index (BMI) is a measurement that can be used to identify possible weight problems. It estimates body fat based on height and weight. Your health care provider can help determine your BMI and help you achieve or maintain a healthy weight. Get regular exercise Get regular exercise. This is one of the most important things you can do for your health. Most adults should: Exercise for at least 150 minutes each week. The exercise should increase your heart rate and make you sweat (moderate-intensity exercise). Do strengthening exercises at least twice a week. This is in addition to the moderate-intensity exercise. Spend less time sitting. Even light physical activity can be beneficial. Watch cholesterol and blood lipids Have your blood tested for lipids and cholesterol at 85 years of age, then have this test every 5 years. You may need to have your cholesterol levels checked more often if: Your lipid or cholesterol levels are high. You are older than 85 years of age. You are at high risk for heart disease. What should I know about cancer screening? Many types of cancers can be detected early and may often be prevented. Depending on your health history and family history, you may need to have cancer screening at various ages. This may include screening for: Colorectal cancer. Prostate cancer. Skin cancer. Lung  cancer. What should I know about heart disease, diabetes, and high blood pressure? Blood pressure and heart disease High blood pressure causes heart disease and increases the risk of stroke. This is more likely to develop in people who have high blood pressure readings, are of African descent, or are overweight. Talk with your health care provider about your target blood pressure readings. Have your blood pressure checked: Every 3-5 years if you are 18-39 years of age. Every year if you are 40 years old or older. If you are between the ages of 65 and 75 and are a current or former smoker, ask your health care provider if you should have a one-time screening for abdominal aortic aneurysm (AAA). Diabetes Have regular diabetes screenings. This checks your fasting blood sugar level. Have the screening done: Once every three years after age 45 if you are at a normal weight and have a low risk for diabetes. More often and at a younger age if you are overweight or have a high risk for diabetes. What should I know about preventing infection? Hepatitis B If you have a higher risk for hepatitis B, you should be screened for this virus. Talk with your health care provider to find out if you are at risk for hepatitis B infection. Hepatitis C Blood testing is recommended for: Everyone born from 1945 through 1965. Anyone with known risk factors for hepatitis C. Sexually transmitted infections (STIs) You should be screened each year for STIs, including gonorrhea and chlamydia, if: You are sexually active and are younger than 85 years of age. You are older than 85 years   of age and your health care provider tells you that you are at risk for this type of infection. Your sexual activity has changed since you were last screened, and you are at increased risk for chlamydia or gonorrhea. Ask your health care provider if you are at risk. Ask your health care provider about whether you are at high risk for HIV.  Your health care provider may recommend a prescription medicine to help prevent HIV infection. If you choose to take medicine to prevent HIV, you should first get tested for HIV. You should then be tested every 3 months for as long as you are taking the medicine. Follow these instructions at home: Lifestyle Do not use any products that contain nicotine or tobacco, such as cigarettes, e-cigarettes, and chewing tobacco. If you need help quitting, ask your health care provider. Do not use street drugs. Do not share needles. Ask your health care provider for help if you need support or information about quitting drugs. Alcohol use Do not drink alcohol if your health care provider tells you not to drink. If you drink alcohol: Limit how much you have to 0-2 drinks a day. Be aware of how much alcohol is in your drink. In the [place] one drink equals one 12 oz bottle of beer (355 mL), one 5 oz glass of wine (148 mL), or one 1 oz glass of hard liquor (44 mL). General instructions Schedule regular health, dental, and eye exams. Stay current with your vaccines. Tell your health care provider if: You often feel depressed. You have ever been abused or do not feel safe at home. Summary Adopting a healthy lifestyle and getting preventive care are important in promoting health and wellness. Follow your health care provider's instructions about healthy diet, exercising, and getting tested or screened for diseases. Follow your health care provider's instructions on monitoring your cholesterol and blood pressure. This information is not intended to replace advice given to you by your health care provider. Make sure you discuss any questions you have with your health care provider. Document Revised: 05/10/2020 Document Reviewed: 02/23/2018 Elsevier Patient Education  2022 Elsevier Inc.  

## 2020-11-26 NOTE — [facility] Notes (Signed)
I reviewed health advisor's note, was available for consultation on the day of service listed in this note, and agree with documentation and plan. Jesusmanuel Erbes, MD.   

## 2020-11-28 ENCOUNTER — Other Ambulatory Visit: Payer: Self-pay

## 2020-11-28 ENCOUNTER — Ambulatory Visit (INDEPENDENT_AMBULATORY_CARE_PROVIDER_SITE_OTHER): Payer: Medicare Other | Admitting: Orthopedic Surgery

## 2020-11-28 ENCOUNTER — Encounter: Payer: Self-pay | Admitting: Orthopedic Surgery

## 2020-11-28 DIAGNOSIS — I2510 Atherosclerotic heart disease of native coronary artery without angina pectoris: Secondary | ICD-10-CM | POA: Diagnosis not present

## 2020-11-28 DIAGNOSIS — L03116 Cellulitis of left lower limb: Secondary | ICD-10-CM | POA: Diagnosis not present

## 2020-11-28 DIAGNOSIS — M170 Bilateral primary osteoarthritis of knee: Secondary | ICD-10-CM | POA: Diagnosis not present

## 2020-11-28 DIAGNOSIS — I872 Venous insufficiency (chronic) (peripheral): Secondary | ICD-10-CM

## 2020-11-28 NOTE — [facility] Notes (Signed)
Office Visit Note   Patient: [name]           Date of Birth: 1930/12/11           MRN: [id number] Visit Date: 11/28/2020              Requested by: Tonia Ghent, MD [place],  [place] [address] PCP: Tonia Ghent, MD  No chief complaint on file.     HPI: Patient is an 87 year old gentleman who is seen for initial evaluation and referral from Dr. Noemi Chapel.  Patient presented with arthritic pain in his left knee as well as redness and swelling of his left leg with concern for cellulitis.  Assessment & Plan: Visit Diagnoses:  1. Venous insufficiency (chronic) (peripheral)     Plan: Patient has venous insufficiency with renal disease.  Recommended elevation we will apply a Dynaflex wrap he will keep this dry and intact until follow-up on Monday.  Patient states he is unable to put on compression socks himself.  Recommended that he stop the colchicine this is not a gout flareup.  Recommended increasing his water intake he states his urine is currently brown.  Follow-Up Instructions: Return in about 1 week (around 12/05/2020) for Follow-up Monday.   Ortho Exam  Patient is alert, oriented, no adenopathy, well-dressed, normal affect, normal respiratory effort. Examination patient has a palpable dorsalis pedis pulse he does have A. fib he is on blood thinners.  Patient has pitting edema of the foot ankle and leg with brawny dermatitis consistent with his venous insufficiency.  No abscess no ulcer no drainage.  Patient's most recent hemoglobin A1c is 7.2.  Patient's most recent uric acid is 7.5.  Imaging: No results found. No images are attached to the encounter.  Labs: Lab Results  Component Value Date   HGBA1C 7.2 (H) 07/23/2020   HGBA1C 7.7 (H) 09/11/2019   HGBA1C 6.8 (H) 05/16/2019   ESRSEDRATE 17 (H) 09/09/2017   CRP 1.1 (H) 09/09/2017   LABURIC 7.5 07/23/2020   LABURIC 8.1 (H) 11/23/2019   LABURIC 7.9 11/06/2019   REPTSTATUS 09/19/2017  FINAL 09/14/2017   GRAMSTAIN  09/10/2017    MODERATE WBC PRESENT,BOTH PMN AND MONONUCLEAR NO ORGANISMS SEEN    CULT  09/14/2017    NO GROWTH 5 DAYS Performed at [facility], [place] [address], [place], [place] [address]      Lab Results  Component Value Date   ALBUMIN 4.0 09/17/2020   ALBUMIN 4.2 07/23/2020   ALBUMIN 4.2 05/16/2019    Lab Results  Component Value Date   MG 2.2 09/09/2017   No results found for: VD25OH  No results found for: PREALBUMIN CBC EXTENDED Latest Ref Rng & Units 09/17/2020 07/23/2020 09/11/2019  WBC 4.0 - 10.5 K/uL 6.0 8.1 9.2  RBC 4.22 - 5.81 MIL/uL 5.79 5.57 5.23  HGB 13.0 - 17.0 g/dL 17.5(H) 16.2 15.7  HCT 39.0 - 52.0 % 51.3 49.3 46.7  PLT 150 - 400 K/uL 136(L) 101.0 Repeated and verified X2.(L) 126.0 Repeated and verified X2.(L)  NEUTROABS 1.7 - 7.7 K/uL 4.1 5.4 6.0  LYMPHSABS 0.7 - 4.0 K/uL 1.3 1.7 2.3     There is no height or weight on file to calculate BMI.  Orders:  No orders of the defined types were placed in this encounter.  No orders of the defined types were placed in this encounter.    Procedures: No procedures performed  Clinical Data: No additional findings.  ROS:  All other systems negative, except as noted in the HPI. Review of Systems  Objective: Vital Signs: There were no vitals taken for this visit.  Specialty Comments:  No specialty comments available.  PMFS History: Patient Active Problem List   Diagnosis Date Noted   Lower urinary tract symptoms (LUTS) 09/16/2020   Creatinine elevation 07/31/2020   Drug-induced myopathy 07/31/2020   Diabetic polyneuropathy associated with type 2 diabetes mellitus ([place]) 06/20/2020   Gouty arthritis of left foot 06/20/2020   Diabetic peripheral neuropathy ([place]) 10/04/2019   Healthcare maintenance 05/22/2019   B12 deficiency 05/17/2019   Idiopathic chronic gout, unspecified site, without tophus (tophi) 09/38/1829   Acute diastolic heart failure (HCC)    CAD S/P  percutaneous coronary angioplasty    Chronic atrial fibrillation (HCC)    CKD (chronic kidney disease), stage III ([place])    Hypertension    Hyperlipidemia    Back pain 05/24/2015   Eustachian tube dysfunction 05/24/2015   Dysphagia, pharyngoesophageal phase 09/25/2014   History of esophageal stricture 09/25/2014   Chronic anticoagulation 09/25/2014   Esophageal stricture from GERD 06/08/2011   Advance care planning 04/20/2011   OSA on CPAP 09/22/2010   Gout 08/15/2008   OBESITY 08/15/2008   Essential hypertension 08/15/2008   CAD (coronary artery disease), native coronary artery 08/15/2008   GASTROESOPHAGEAL REFLUX DISEASE 08/15/2008   Past Medical History:  Diagnosis Date   Bradycardia    a. nocturnal with pauses overnight on tele likely due to OSA 10/2015.   Cancer [facility])    skin cancer   CKD (chronic kidney disease), stage III (HCC)    Coronary artery disease    a. prev nonobst. b. LHC 10/24/15: occlusion of small diffusely diseased OM2 s/p balloon angioplasty, mild nonobstructive disease of mLAD and mRCA, normal LVEDP   Diabetes mellitus without complication (HCC)    Diabetic peripheral neuropathy ([place]) 10/04/2019   Diverticulosis    Esophageal reflux    Esophageal stricture from GERD 06/08/2011   With stricture at GE junction on EGD, dilated 06/2011    Gastric ulcer    on EGD 2013   Gout, unspecified    "on daily RX" (09/09/2017)   History of blood transfusion 02/1964   "when I had right little finger cut off"   Hyperlipidemia    Hypertension    Obesity, unspecified    Orthopnea    OSA on CPAP    Paroxysmal nocturnal dyspnea    Persistent atrial fibrillation (HCC)    Vertigo     Family History  Problem Relation Age of Onset   Stomach cancer Mother        died in 33's    Hypertension Father        died in his 87's pna likely dementia   Dementia Father    Pneumonia Father    Heart disease Brother        S/P CABG   Hypertension Other    Prostate cancer Neg Hx     Colon cancer Neg Hx     Past Surgical History:  Procedure Laterality Date   AMPUTATION Right 03/03/2019   Procedure: RIGHT 2ND TOE AMPUTATION;  Surgeon: [name], MD;  Location: [facility];  Service: Orthopedics;  Laterality: Right;   BALLOON DILATION N/A 10/22/2014   Procedure: BALLOON DILATION;  Surgeon: [name], MD;  [facility];  Service: Endoscopy;  Laterality: N/A;   CARDIAC CATHETERIZATION  07/2008   CARDIAC CATHETERIZATION N/A 10/24/2015   Procedure: Left  Heart Cath and Coronary Angiography;  Surgeon: [name], MD;  Location: [facility];  Service: Cardiovascular;  Laterality: N/A;   CARDIAC CATHETERIZATION N/A 10/24/2015   Procedure: Coronary Balloon Angioplasty;  Surgeon: [name], MD;  Location: [facility];  Service: Cardiovascular;  Laterality: N/A;   Carotid Dopplers  09/2009   no sig extracranial stenosis and vertebral arteries had antegrade flow   CATARACT EXTRACTION W/ INTRAOCULAR LENS  IMPLANT, BILATERAL Bilateral    ESOPHAGOGASTRODUODENOSCOPY (EGD) WITH ESOPHAGEAL DILATION     "I've had it stretched 3 times" (10/23/2015)   ESOPHAGOGASTRODUODENOSCOPY (EGD) WITH ESOPHAGEAL DILATION  06/2011   /medical hx above  (09/09/2017)   ESOPHAGOGASTRODUODENOSCOPY (EGD) WITH PROPOFOL N/A 10/22/2014   Procedure: ESOPHAGOGASTRODUODENOSCOPY (EGD) WITH PROPOFOL;  Surgeon: [name], MD;  [facility];  Service: Endoscopy;  Laterality: N/A;   FINGER SURGERY Right 02/1964   "cut little finger off; had it reattached"   I & D EXTREMITY Right 09/10/2017   Procedure: RIGHT FOOT DEBRIDEMENT;  Surgeon: [name], MD;  Location: [facility];  Service: Orthopedics;  Laterality: Right;   MRI of brain  09/2009   chronic microvascular ischemia   OSA Sleep Study     per Dr. 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

## 2020-11-29 DIAGNOSIS — U071 COVID-19: Secondary | ICD-10-CM | POA: Diagnosis not present

## 2020-12-02 ENCOUNTER — Encounter: Payer: Self-pay | Admitting: Orthopedic Surgery

## 2020-12-02 ENCOUNTER — Ambulatory Visit (INDEPENDENT_AMBULATORY_CARE_PROVIDER_SITE_OTHER): Payer: Medicare Other | Admitting: Physician Assistant

## 2020-12-02 DIAGNOSIS — I872 Venous insufficiency (chronic) (peripheral): Secondary | ICD-10-CM

## 2020-12-02 DIAGNOSIS — I2510 Atherosclerotic heart disease of native coronary artery without angina pectoris: Secondary | ICD-10-CM

## 2020-12-02 NOTE — [facility] Notes (Signed)
Office Visit Note   Patient: [name]           Date of Birth: [birth year]           MRN: [id number] Visit Date: 12/02/2020              Requested by: Tonia Ghent, MD [address] [place],  [place] [address] PCP: Tonia Ghent, MD  Chief Complaint  Patient presents with   Left Leg - Follow-up      HPI: Patient is a pleasant 87 year old gentleman who comes in for follow-up for his left leg.  He presented last week with swelling and redness of his leg.  He was seen and evaluated findings consistent with chronic venous insufficiency.  He was placed in a Dynaflex wrap.  Assessment & Plan: Visit Diagnoses: No diagnosis found.  Plan: Patient's erythema is completely resolved as is most of his swelling.  I do recommend that he get a pair of large vive compression socks gave him information about this.  He is concerned about being able to put them on.  He does have a daughter and a friend of the family that can show him how.  Certainly I told him once he obtains these if he wants [place] to place the socks on and show him how would be happy to do that follow-up in 2 weeks.  Encouraged to get the socks of soon as possible  Follow-Up Instructions: No follow-ups on file.   Ortho Exam  Patient is alert, oriented, no adenopathy, well-dressed, normal affect, normal respiratory effort. Left lower extremity wrap removed he has excellent wrinkling of the skin.  No redness no erythema no cellulitis.  No open ulcers or skin breakdown  Imaging: No results found. No images are attached to the encounter.  Labs: Lab Results  Component Value Date   HGBA1C 7.2 (H) 07/23/2020   HGBA1C 7.7 (H) 09/11/2019   HGBA1C 6.8 (H) 05/16/2019   ESRSEDRATE 17 (H) 09/09/2017   CRP 1.1 (H) 09/09/2017   LABURIC 7.5 07/23/2020   LABURIC 8.1 (H) 11/23/2019   LABURIC 7.9 11/06/2019   REPTSTATUS 09/19/2017 FINAL 09/14/2017   GRAMSTAIN  09/10/2017    MODERATE WBC PRESENT,BOTH PMN AND  MONONUCLEAR NO ORGANISMS SEEN    CULT  09/14/2017    NO GROWTH 5 DAYS Performed at [facility], [place] [address] [place], [place] [address]      Lab Results  Component Value Date   ALBUMIN 4.0 09/17/2020   ALBUMIN 4.2 07/23/2020   ALBUMIN 4.2 05/16/2019    Lab Results  Component Value Date   MG 2.2 09/09/2017   No results found for: VD25OH  No results found for: PREALBUMIN CBC EXTENDED Latest Ref Rng & Units 09/17/2020 07/23/2020 09/11/2019  WBC 4.0 - 10.5 K/uL 6.0 8.1 9.2  RBC 4.22 - 5.81 MIL/uL 5.79 5.57 5.23  HGB 13.0 - 17.0 g/dL 17.5(H) 16.2 15.7  HCT 39.0 - 52.0 % 51.3 49.3 46.7  PLT 150 - 400 K/uL 136(L) 101.0 Repeated and verified X2.(L) 126.0 Repeated and verified X2.(L)  NEUTROABS 1.7 - 7.7 K/uL 4.1 5.4 6.0  LYMPHSABS 0.7 - 4.0 K/uL 1.3 1.7 2.3     There is no height or weight on file to calculate BMI.  Orders:  No orders of the defined types were placed in this encounter.  No orders of the defined types were placed in this encounter.    Procedures: No procedures performed  Clinical Data:  No additional findings.  ROS:  All other systems negative, except as noted in the HPI. Review of Systems  Objective: Vital Signs: There were no vitals taken for this visit.  Specialty Comments:  No specialty comments available.  PMFS History: Patient Active Problem List   Diagnosis Date Noted   Lower urinary tract symptoms (LUTS) 09/16/2020   Creatinine elevation 07/31/2020   Drug-induced myopathy 07/31/2020   Diabetic polyneuropathy associated with type 2 diabetes mellitus ([place]) 06/20/2020   Gouty arthritis of left foot 06/20/2020   Diabetic peripheral neuropathy ([place]) 10/04/2019   Healthcare maintenance 05/22/2019   B12 deficiency 05/17/2019   Idiopathic chronic gout, unspecified site, without tophus (tophi) 99/83/3825   Acute diastolic heart failure (HCC)    CAD S/P percutaneous coronary angioplasty    Chronic atrial fibrillation (HCC)    CKD  (chronic kidney disease), stage III ([place])    Hypertension    Hyperlipidemia    Back pain 05/24/2015   Eustachian tube dysfunction 05/24/2015   Dysphagia, pharyngoesophageal phase 09/25/2014   History of esophageal stricture 09/25/2014   Chronic anticoagulation 09/25/2014   Esophageal stricture from GERD 06/08/2011   Advance care planning 04/20/2011   OSA on CPAP 09/22/2010   Gout 08/15/2008   OBESITY 08/15/2008   Essential hypertension 08/15/2008   CAD (coronary artery disease), native coronary artery 08/15/2008   GASTROESOPHAGEAL REFLUX DISEASE 08/15/2008   Past Medical History:  Diagnosis Date   Bradycardia    a. nocturnal with pauses overnight on tele likely due to OSA 10/2015.   Cancer [facility])    skin cancer   CKD (chronic kidney disease), stage III (HCC)    Coronary artery disease    a. prev nonobst. b. LHC 10/24/15: occlusion of small diffusely diseased OM2 s/p balloon angioplasty, mild nonobstructive disease of mLAD and mRCA, normal LVEDP   Diabetes mellitus without complication (HCC)    Diabetic peripheral neuropathy ([place]) 10/04/2019   Diverticulosis    Esophageal reflux    Esophageal stricture from GERD 06/08/2011   With stricture at GE junction on EGD, dilated 06/2011    Gastric ulcer    on EGD 2013   Gout, unspecified    "on daily RX" (09/09/2017)   History of blood transfusion 02/1964   "when I had right little finger cut off"   Hyperlipidemia    Hypertension    Obesity, unspecified    Orthopnea    OSA on CPAP    Paroxysmal nocturnal dyspnea    Persistent atrial fibrillation (HCC)    Vertigo     Family History  Problem Relation Age of Onset   Stomach cancer Mother        died in 28's    Hypertension Father        died in his 24's pna likely dementia   Dementia Father    Pneumonia Father    Heart disease Brother        S/P CABG   Hypertension Other    Prostate cancer Neg Hx    Colon cancer Neg Hx     Past Surgical History:  Procedure Laterality Date    AMPUTATION Right 03/03/2019   Procedure: RIGHT 2ND TOE AMPUTATION;  Surgeon: [name], MD;  Location: [facility];  Service: Orthopedics;  Laterality: Right;   BALLOON DILATION N/A 10/22/2014   Procedure: BALLOON DILATION;  Surgeon: [name], MD;  [facility];  Service: Endoscopy;  Laterality: N/A;   CARDIAC CATHETERIZATION  07/2008   CARDIAC CATHETERIZATION N/A 10/24/2015  Procedure: Left Heart Cath and Coronary Angiography;  Surgeon: [name], MD;  Location: [facility];  Service: Cardiovascular;  Laterality: N/A;   CARDIAC CATHETERIZATION N/A 10/24/2015   Procedure: Coronary Balloon Angioplasty;  Surgeon: [name], MD;  Location: [facility];  Service: Cardiovascular;  Laterality: N/A;   Carotid Dopplers  09/2009   no sig extracranial stenosis and vertebral arteries had antegrade flow   CATARACT EXTRACTION W/ INTRAOCULAR LENS  IMPLANT, BILATERAL Bilateral    ESOPHAGOGASTRODUODENOSCOPY (EGD) WITH ESOPHAGEAL DILATION     "I've had it stretched 3 times" (10/23/2015)   ESOPHAGOGASTRODUODENOSCOPY (EGD) WITH ESOPHAGEAL DILATION  06/2011   /medical hx above  (09/09/2017)   ESOPHAGOGASTRODUODENOSCOPY (EGD) WITH PROPOFOL N/A 10/22/2014   Procedure: ESOPHAGOGASTRODUODENOSCOPY (EGD) WITH PROPOFOL;  Surgeon: [name], MD;  [facility];  Service: Endoscopy;  Laterality: N/A;   FINGER SURGERY Right 02/1964   "cut little finger off; had it reattached"   I & D EXTREMITY Right 09/10/2017   Procedure: RIGHT FOOT DEBRIDEMENT;  Surgeon: [name], MD;  Location: [facility];  Service: Orthopedics;  Laterality: Right;   MRI of brain  09/2009   chronic microvascular ischemia   OSA Sleep Study     per Dr. 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

## 2020-12-04 ENCOUNTER — Other Ambulatory Visit: Payer: Self-pay

## 2020-12-04 ENCOUNTER — Ambulatory Visit (INDEPENDENT_AMBULATORY_CARE_PROVIDER_SITE_OTHER): Payer: Medicare Other

## 2020-12-04 DIAGNOSIS — E538 Deficiency of other specified B group vitamins: Secondary | ICD-10-CM | POA: Diagnosis not present

## 2020-12-04 MED ORDER — CYANOCOBALAMIN 1000 MCG/ML IJ SOLN
1000.0000 ug | Freq: Once | INTRAMUSCULAR | Status: AC
  Administered 2020-12-04: 1000 ug via INTRAMUSCULAR

## 2020-12-04 NOTE — [facility] Notes (Signed)
Per orders of Dr. Duncan, injection of B12 was given by Jacynda Brunke R Cahterine Heinzel. Patient tolerated injection well.  

## 2020-12-05 ENCOUNTER — Telehealth: Payer: Self-pay | Admitting: *Deleted

## 2020-12-05 ENCOUNTER — Encounter: Payer: Self-pay | Admitting: Adult Health

## 2020-12-05 ENCOUNTER — Ambulatory Visit (INDEPENDENT_AMBULATORY_CARE_PROVIDER_SITE_OTHER): Payer: Medicare Other | Admitting: Adult Health

## 2020-12-05 VITALS — BP 128/79 | HR 103 | Ht 66.0 in | Wt 213.2 lb

## 2020-12-05 DIAGNOSIS — I2510 Atherosclerotic heart disease of native coronary artery without angina pectoris: Secondary | ICD-10-CM

## 2020-12-05 DIAGNOSIS — G4731 Primary central sleep apnea: Secondary | ICD-10-CM | POA: Diagnosis not present

## 2020-12-05 DIAGNOSIS — Z9989 Dependence on other enabling machines and devices: Secondary | ICD-10-CM | POA: Diagnosis not present

## 2020-12-05 NOTE — [facility] Notes (Signed)
I have read the note, and I agree with the clinical assessment and plan.  Samiksha Pellicano K Analisa Sledd   

## 2020-12-05 NOTE — Progress Notes (Signed)
PATIENT: [name] DOB: Jun 04, 1930  REASON FOR VISIT: follow up HISTORY FROM: patient PRIMARY NEUROLOGIST: Dr. Brett Fairy  HISTORY OF PRESENT ILLNESS: Today 12/05/20:  [name] is an 87 year old male with a history of obstructive sleep apnea on BiPAP.  He returns today for follow-up.  He reports that he continues to have his old machine.  Unable to get a new [facility] machine.  They did offer him a [place]onia Musty he prefers [facility].  He states that his old machine seems to be working okay.  He returns today for an evaluation.     REVIEW OF SYSTEMS: Out of a complete 14 system review of symptoms, the patient complains only of the following symptoms, and all other reviewed systems are negative.  See HPI  ALLERGIES: Allergies  Allergen Reactions   Penicillins Shortness Of Breath and Swelling    Patient tolerated Rocephin injection 2019  PATIENT HAS HAD A PCN REACTION WITH IMMEDIATE RASH, FACIAL/TONGUE/THROAT SWELLING, SOB, OR LIGHTHEADEDNESS WITH HYPOTENSION:  #  #  YES  #  Has patient had a PCN reaction causing severe rash involving mucus membranes or skin necrosis: NO Has patient had a PCN reaction that required hospitalization NO Has patient had a PCN reaction occurring within the last 10 years: NO If all of the above answers are "NO", then may proceed with Cephalosporin use.    Atorvastatin Other (See Comments)    No energy, just felt bad     Pravastatin Other (See Comments)    myalgias   Colchicine     Due to renal function- can use prednisone if needed    Hydrocodone Other (See Comments)    "swimmy headed" after use   Nsaids     Due to renal function   Uloric [Febuxostat] Other (See Comments)    Would avoid, worsening gout sx with use.    Vancomycin Other (See Comments)    Red man syndrome- tolerated with slow run and benadryl use   Biaxin [Clarithromycin] Diarrhea   Doxycycline Other (See Comments)    GI upset    HOME MEDICATIONS: Outpatient Medications Prior to  Visit  Medication Sig Dispense Refill   acetaminophen (TYLENOL) 500 MG tablet Take 1,000 mg by mouth as needed for mild pain.     allopurinol (ZYLOPRIM) 100 MG tablet TAKE 1 TABLET BY MOUTH EVERY DAY 90 tablet 3   baclofen (LIORESAL) 10 MG tablet TAKE 0.5-1 TABLETS (5-10 MG TOTAL) BY MOUTH AT BEDTIME AS NEEDED FOR MUSCLE SPASMS. 90 tablet 1   clindamycin (CLEOCIN) 300 MG capsule Take 1 capsule (300 mg total) by mouth 3 (three) times daily. 30 capsule 0   cyanocobalamin (,VITAMIN B-12,) 1000 MCG/ML injection 1047mcg IM every 28 days 1 mL    diltiazem (CARDIZEM CD) 180 MG 24 hr capsule TAKE 1 CAPSULE BY MOUTH EVERY DAY 90 capsule 2   fluticasone (FLONASE) 50 MCG/ACT nasal spray Place 2 sprays into both nostrils daily.     furosemide (LASIX) 20 MG tablet TAKE 1 TABLET BY MOUTH DAILY. PLEASE MAKE YEARLY APPT WITH DR. Burt Knack FOR MAY BEFORE ANYMORE REFILLS 90 tablet 3   lidocaine (LIDODERM) 5 % Place 1 patch onto the skin daily. Remove & Discard patch within 12 hours or as directed by MD 30 patch 0   lisinopril (ZESTRIL) 10 MG tablet Take 1 tablet (10 mg total) by mouth daily.     metoprolol tartrate (LOPRESSOR) 25 MG tablet TAKE 1 TABLET BY MOUTH TWICE A DAY 180 tablet 3  omeprazole (PRILOSEC) 40 MG capsule TAKE 1 CAPSULE BY MOUTH EVERY DAY 90 capsule 1   predniSONE (DELTASONE) 10 MG tablet Take as directed for 12 days.  Daily dose 6,6,5,5,4,4,3,3,2,2,1,1. 42 tablet 0   tamsulosin (FLOMAX) 0.4 MG CAPS capsule Take 1 capsule (0.4 mg total) by mouth daily. 90 capsule 3   traMADol (ULTRAM) 50 MG tablet Take 1 tablet (50 mg total) by mouth every 6 (six) hours as needed. 30 tablet 0   warfarin (COUMADIN) 4 MG tablet Take 1 to 1 and 1/2 tablets by mouth daily or as directed by [facility]mad[facility] Clinic 120 tablet 0   No facility-administered medications prior to visit.    PAST MEDICAL HISTORY: Past Medical History:  Diagnosis Date   Bradycardia    a. nocturnal with pauses overnight on tele likely due to OSA  10/2015.   Cancer ([place])    skin cancer   CKD (chronic kidney disease), stage III (HCC)    Coronary artery disease    a. prev nonobst. b. LHC 10/24/15: occlusion of small diffusely diseased OM2 s/p balloon angioplasty, mild nonobstructive disease of mLAD and mRCA, normal LVEDP   Diabetes mellitus without complication ([place])    Diabetic peripheral neuropathy ([place]) 10/04/2019   Diverticulosis    Esophageal reflux    Esophageal stricture from GERD 06/08/2011   With stricture at [place] junction on EGD, dilated 06/2011    Gastric ulcer    on EGD 2013   Gout, unspecified    "on daily RX" (09/09/2017)   History of blood transfusion 02/1964   "when I had right little finger cut off"   Hyperlipidemia    Hypertension    Obesity, unspecified    Orthopnea    OSA on CPAP    Paroxysmal nocturnal dyspnea    Persistent atrial fibrillation ([place])    Vertigo     PAST SURGICAL HISTORY: Past Surgical History:  Procedure Laterality Date   AMPUTATION Right 03/03/2019   Procedure: RIGHT 2ND TOE AMPUTATION;  Surgeon: [name], MD;  Location: [facility];  Service: Orthopedics;  Laterality: Right;   BALLOON DILATION N/A 10/22/2014   Procedure: BALLOON DILATION;  Surgeon: [name], MD;  [facility];  Service: Endoscopy;  Laterality: N/A;   CARDIAC CATHETERIZATION  07/2008   CARDIAC CATHETERIZATION N/A 10/24/2015   Procedure: Left Heart Cath and Coronary Angiography;  Surgeon: [name], MD;  Location: [facility];  Service: Cardiovascular;  Laterality: N/A;   CARDIAC CATHETERIZATION N/A 10/24/2015   Procedure: Coronary Balloon Angioplasty;  Surgeon: [name], MD;  Location: [facility];  Service: Cardiovascular;  Laterality: N/A;   Carotid Dopplers  09/2009   no sig extracranial stenosis and vertebral arteries had antegrade flow   CATARACT EXTRACTION W/ INTRAOCULAR LENS  IMPLANT, BILATERAL Bilateral    ESOPHAGOGASTRODUODENOSCOPY (EGD) WITH ESOPHAGEAL DILATION     "I've had  it stretched 3 times" (10/23/2015)   ESOPHAGOGASTRODUODENOSCOPY (EGD) WITH ESOPHAGEAL DILATION  06/2011   /medical hx above  (09/09/2017)   ESOPHAGOGASTRODUODENOSCOPY (EGD) WITH PROPOFOL N/A 10/22/2014   Procedure: ESOPHAGOGASTRODUODENOSCOPY (EGD) WITH PROPOFOL;  Surgeon: [name], MD;  [facility];  Service: Endoscopy;  Laterality: N/A;   FINGER SURGERY Right 02/1964   "cut little finger off; had it reattached"   I & D EXTREMITY Right 09/10/2017   Procedure: RIGHT FOOT DEBRIDEMENT;  Surgeon: [name], MD;  Location: [facility];  Service: Orthopedics;  Laterality: Right;   MRI of brain  09/2009  chronic microvascular ischemia   OSA Sleep Study     per Dr. Brett Fairy   SHOULDER ARTHROSCOPY W/ ROTATOR CUFF REPAIR Right 2008    FAMILY HISTORY: Family History  Problem Relation Age of Onset   Stomach cancer Mother        died in 40's    Hypertension Father        died in his 34's pna likely dementia   Dementia Father    Pneumonia Father    Heart disease Brother        S/P CABG   Hypertension Other    Prostate cancer Neg Hx    Colon cancer Neg Hx     SOCIAL HISTORY: Social History   Socioeconomic History   Marital status: Widowed    Spouse name: Not on file   Number of children: 3   Years of education: Not on file   Highest education level: Not on file  Occupational History   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  Tobacco Use   Smoking status: Former    Packs/day: 0.12    Years: 4.00    Pack years: 0.48    Types: Cigarettes    Quit date: 03/16/1969    Years since quitting: 51.7   Smokeless tobacco: Former   Tobacco comments:    "chewed when I smoked; smoked 1 pack/week for about 4 years, quit 1971  Vaping Use   Vaping Use: Never used  Substance and Sexual Activity   Alcohol use: Not Currently    Alcohol/week: 0.0 standard drinks    Comment: 09/09/2017  "Quit all alcohol in 1970's"   Drug use: Never   Sexual activity: Not on  file  Other Topics Concern   Not on file  Social History Narrative   Lives in [place], married 1953, widowed after 23 years   Retired Administrator, still does Biomedical scientist.   He is not routinely exercising.   Daily caffeine    Social Determinants of Health   Financial Resource Strain: Low Risk    Difficulty of Paying Living Expenses: Not very hard  Food Insecurity: No Food Insecurity   Worried About Charity fundraiser in the Last Year: Never true   Ran Out of Food in the Last Year: Never true  Transportation Needs: No Transportation Needs   Lack of Transportation (Medical): No   Lack of Transportation (Non-Medical): No  Physical Activity: Insufficiently Active   Days of Exercise per Week: 7 days   Minutes of Exercise per Session: 20 min  Stress: No Stress Concern Present   Feeling of Stress : Not at all  Social Connections: Moderately Integrated   Frequency of Communication with Friends and Family: Twice a week   Frequency of Social Gatherings with Friends and Family: Once a week   Attends Religious Services: 1 to 4 times per year   Active Member of [employer] or Organizations: No   Attends Archivist Meetings: 1 to 4 times per year   Marital Status: Widowed  Intimate Partner Violence: Not At Risk   Fear of Current or Ex-Partner: No   Emotionally Abused: No   Physically Abused: No   Sexually Abused: No      PHYSICAL EXAM  Vitals:   12/05/20 1114  BP: 128/79  Pulse: (!) 103  Weight: 213 lb 3.2 oz (96.7 kg)  Height: 5\' 6"  (1.676 m)   Body mass index is 34.41 kg/m.  Generalized: Well developed, in no acute  distress  Chest: Lungs clear to auscultation bilaterally  Neurological examination  Mentation: Alert oriented to time, place, history taking. Follows all commands speech and language fluent Cranial nerve II-XII: Extraocular movements were full, visual field were full on confrontational test Head turning and shoulder shrug  were normal and  symmetric. Motor: The motor testing reveals 5 over 5 strength of all 4 extremities. Good symmetric motor tone is noted throughout.  Sensory: Sensory testing is intact to soft touch on all 4 extremities. No evidence of extinction is noted.  Gait and station: Gait is normal.    DIAGNOSTIC DATA (LABS, IMAGING, TESTING) - I reviewed patient records, labs, notes, testing and imaging myself where available.  Lab Results  Component Value Date   WBC 6.0 09/17/2020   HGB 17.5 (H) 09/17/2020   HCT 51.3 09/17/2020   MCV 88.6 09/17/2020   PLT 136 (L) 09/17/2020      Component Value Date/Time   NA 135 09/17/2020 1118   K 4.3 09/17/2020 1118   CL 100 09/17/2020 1118   CO2 26 09/17/2020 1118   GLUCOSE 116 (H) 09/17/2020 1118   BUN 15 09/17/2020 1118   CREATININE 1.22 09/17/2020 1118   CREATININE 1.46 (H) 11/06/2019 1118   CALCIUM 9.4 09/17/2020 1118   PROT 7.5 09/17/2020 1118   PROT 7.1 09/07/2019 1020   ALBUMIN 4.0 09/17/2020 1118   AST 26 09/17/2020 1118   ALT 24 09/17/2020 1118   ALKPHOS 86 09/17/2020 1118   BILITOT 1.1 09/17/2020 1118   GFRNONAA 57 (L) 09/17/2020 1118   GFRAA 55 (L) 03/03/2019 0643   Lab Results  Component Value Date   CHOL 166 05/16/2019   HDL 26.40 (L) 05/16/2019   LDLCALC 82 10/24/2015   LDLDIRECT 95.0 05/16/2019   TRIG 279.0 (H) 05/16/2019   CHOLHDL 6 05/16/2019   Lab Results  Component Value Date   HGBA1C 7.2 (H) 07/23/2020   Lab Results  Component Value Date   VITAMINB12 604 07/23/2020   Lab Results  Component Value Date   TSH 2.33 05/16/2019      ASSESSMENT AND PLAN 87 y.o. year old male  has a past medical history of Bradycardia, Cancer ([place]), CKD (chronic kidney disease), stage III ([place]), Coronary artery disease, Diabetes mellitus without complication ([place]), Diabetic peripheral neuropathy ([place]) (10/04/2019), Diverticulosis, Esophageal reflux, Esophageal stricture from GERD (06/08/2011), Gastric ulcer, Gout, unspecified, History of blood  transfusion (02/1964), Hyperlipidemia, Hypertension, Obesity, unspecified, Orthopnea, OSA on CPAP, Paroxysmal nocturnal dyspnea, Persistent atrial fibrillation ([place]), and Vertigo. here with:  OSA on biPAP  - BiPAP compliance excellent - Good treatment of AHI  - Encourage patient to use BiPAP nightly and > 4 hours each night -Advised that I would reach out to his DME company requesting an update on his new machine - F/U in 1 year or sooner if needed   Ward Givens, MSN, NP-C 12/05/2020, 11:07 AM [facility] [address] [place], [place] [address] [phone]

## 2020-12-05 NOTE — Telephone Encounter (Signed)
Secure message sent to [employer] asking for update on pt's BiPAP machine.

## 2020-12-05 NOTE — Patient Instructions (Signed)
Continue using BiPAP nightly and greater than 4 hours each night Will contact DME company about new machine If your symptoms worsen or you develop new symptoms please let us know.

## 2020-12-09 DIAGNOSIS — R809 Proteinuria, unspecified: Secondary | ICD-10-CM | POA: Diagnosis not present

## 2020-12-09 DIAGNOSIS — I129 Hypertensive chronic kidney disease with stage 1 through stage 4 chronic kidney disease, or unspecified chronic kidney disease: Secondary | ICD-10-CM | POA: Diagnosis not present

## 2020-12-09 DIAGNOSIS — N2581 Secondary hyperparathyroidism of renal origin: Secondary | ICD-10-CM | POA: Diagnosis not present

## 2020-12-09 DIAGNOSIS — N183 Chronic kidney disease, stage 3 unspecified: Secondary | ICD-10-CM | POA: Diagnosis not present

## 2020-12-09 DIAGNOSIS — Z23 Encounter for immunization: Secondary | ICD-10-CM | POA: Diagnosis not present

## 2020-12-09 DIAGNOSIS — D631 Anemia in chronic kidney disease: Secondary | ICD-10-CM | POA: Diagnosis not present

## 2020-12-09 DIAGNOSIS — N189 Chronic kidney disease, unspecified: Secondary | ICD-10-CM | POA: Diagnosis not present

## 2020-12-11 ENCOUNTER — Other Ambulatory Visit: Payer: Self-pay

## 2020-12-11 ENCOUNTER — Other Ambulatory Visit: Payer: Self-pay | Admitting: Cardiovascular Disease

## 2020-12-11 ENCOUNTER — Ambulatory Visit (INDEPENDENT_AMBULATORY_CARE_PROVIDER_SITE_OTHER): Payer: Medicare Other | Admitting: Otolaryngology

## 2020-12-11 DIAGNOSIS — I2510 Atherosclerotic heart disease of native coronary artery without angina pectoris: Secondary | ICD-10-CM | POA: Diagnosis not present

## 2020-12-11 DIAGNOSIS — H6123 Impacted cerumen, bilateral: Secondary | ICD-10-CM | POA: Diagnosis not present

## 2020-12-11 DIAGNOSIS — I482 Chronic atrial fibrillation, unspecified: Secondary | ICD-10-CM

## 2020-12-11 DIAGNOSIS — J310 Chronic rhinitis: Secondary | ICD-10-CM

## 2020-12-11 MED ORDER — TRIAMCINOLONE ACETONIDE 55 MCG/ACT NA AERO: 2.0000 | INHALATION_SPRAY | Freq: Every day | NASAL | 12 refills | Status: DC

## 2020-12-11 NOTE — Progress Notes (Signed)
HPI: [name] is a 87 y.o. male who presents is referred by his PCP for evaluation of complaints of "sinus pressure".  And also chronic postnasal drainage which is generally quite are clear.  He has used Flonase intermittently in the past without much benefit.  More recently has been using decongestant nasal spray.  He has not used any saline rinses. Denies any yellow-green discharge from his nose.[name] His ears occasionally pop and he hears much better.  Past Medical History:  Diagnosis Date   Bradycardia    a. nocturnal with pauses overnight on tele likely due to OSA 10/2015.   Cancer ([place])    skin cancer   CKD (chronic kidney disease), stage III (HCC)    Coronary artery disease    a. prev nonobst. b. LHC 10/24/15: occlusion of small diffusely diseased OM2 s/p balloon angioplasty, mild nonobstructive disease of mLAD and mRCA, normal LVEDP   Diabetes mellitus without complication ([place])    Diabetic peripheral neuropathy ([place]) 10/04/2019   Diverticulosis    Esophageal reflux    Esophageal stricture from GERD 06/08/2011   With stricture at [place] junction on EGD, dilated 06/2011    Gastric ulcer    on EGD 2013   Gout, unspecified    "on daily RX" (09/09/2017)   History of blood transfusion 02/1964   "when I had right little finger cut off"   Hyperlipidemia    Hypertension    Obesity, unspecified    Orthopnea    OSA on CPAP    Paroxysmal nocturnal dyspnea    Persistent atrial fibrillation ([place])    Vertigo    Past Surgical History:  Procedure Laterality Date   AMPUTATION Right 03/03/2019   Procedure: RIGHT 2ND TOE AMPUTATION;  Surgeon: [name], MD;  Location: [facility];  Service: Orthopedics;  Laterality: Right;   BALLOON DILATION N/A 10/22/2014   Procedure: BALLOON DILATION;  Surgeon: [name], MD;  [facility];  Service: Endoscopy;  Laterality: N/A;   CARDIAC CATHETERIZATION  07/2008   CARDIAC CATHETERIZATION N/A 10/24/2015   Procedure: Left Heart Cath and Coronary  Angiography;  Surgeon: [name], MD;  Location: [facility];  Service: Cardiovascular;  Laterality: N/A;   CARDIAC CATHETERIZATION N/A 10/24/2015   Procedure: Coronary Balloon Angioplasty;  Surgeon: [name], MD;  Location: [facility];  Service: Cardiovascular;  Laterality: N/A;   Carotid Dopplers  09/2009   no sig extracranial stenosis and vertebral arteries had antegrade flow   CATARACT EXTRACTION W/ INTRAOCULAR LENS  IMPLANT, BILATERAL Bilateral    ESOPHAGOGASTRODUODENOSCOPY (EGD) WITH ESOPHAGEAL DILATION     "I've had it stretched 3 times" (10/23/2015)   ESOPHAGOGASTRODUODENOSCOPY (EGD) WITH ESOPHAGEAL DILATION  06/2011   /medical hx above  (09/09/2017)   ESOPHAGOGASTRODUODENOSCOPY (EGD) WITH PROPOFOL N/A 10/22/2014   Procedure: ESOPHAGOGASTRODUODENOSCOPY (EGD) WITH PROPOFOL;  Surgeon: [name], MD;  [facility];  Service: Endoscopy;  Laterality: N/A;   FINGER SURGERY Right 02/1964   "cut little finger off; had it reattached"   I & D EXTREMITY Right 09/10/2017   Procedure: RIGHT FOOT DEBRIDEMENT;  Surgeon: [name], MD;  Location: [facility];  Service: Orthopedics;  Laterality: Right;   MRI of brain  09/2009   chronic microvascular ischemia   OSA Sleep Study     per Dr. 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  Tobacco Use   Smoking status: Former    Packs/day: 0.12    Years: 4.00    Pack years: 0.48    Types: Cigarettes    Quit date: 03/16/1969    Years since quitting: 51.7   Smokeless tobacco: Former   Tobacco comments:    "chewed when I smoked; smoked 1 pack/week for about 4 years, quit 1971  Vaping Use   Vaping Use: Never used   Substance and Sexual Activity   Alcohol use: Not Currently    Alcohol/week: 0.0 standard drinks    Comment: 09/09/2017  "Quit all alcohol in 1970's"   Drug use: Never   Sexual activity: Not on file  Other Topics Concern   Not on file  Social History Narrative   Lives in [place], married 1953, widowed after 90 years   Retired Administrator, still does Biomedical scientist.   He is not routinely exercising.   Daily caffeine    Social Determinants of Health   Financial Resource Strain: Low Risk    Difficulty of Paying Living Expenses: Not very hard  Food Insecurity: No Food Insecurity   Worried About Charity fundraiser in the Last Year: Never true   Ran Out of Food in the Last Year: Never true  Transportation Needs: No Transportation Needs   Lack of Transportation (Medical): No   Lack of Transportation (Non-Medical): No  Physical Activity: Insufficiently Active   Days of Exercise per Week: 7 days   Minutes of Exercise per Session: 20 min  Stress: No Stress Concern Present   Feeling of Stress : Not at all  Social Connections: Moderately Integrated   Frequency of Communication with Friends and Family: Twice a week   Frequency of Social Gatherings with Friends and Family: Once a week   Attends Religious Services: 1 to 4 times per year   Active Member of [employer] or Organizations: No   Attends Archivist Meetings: 1 to 4 times per year   Marital Status: Widowed   Family History  Problem Relation Age of Onset   Stomach cancer Mother        died in 43's    Hypertension Father        died in his 79's pna likely dementia   Dementia Father    Pneumonia Father    Heart disease Brother        S/P CABG   Hypertension Other    Sleep apnea Daughter    Prostate cancer Neg Hx    Colon cancer Neg Hx    Allergies  Allergen Reactions   Penicillins Shortness Of Breath and Swelling    Patient tolerated Rocephin injection 2019  PATIENT HAS HAD A PCN REACTION WITH IMMEDIATE RASH,  FACIAL/TONGUE/THROAT SWELLING, SOB, OR LIGHTHEADEDNESS WITH HYPOTENSION:  #  #  YES  #  Has patient had a PCN reaction causing severe rash involving mucus membranes or skin necrosis: NO Has patient had a PCN reaction that required hospitalization NO Has patient had a PCN reaction occurring within the last 10 years: NO If all of the above answers are "NO", then may proceed with Cephalosporin use.    Atorvastatin Other (See Comments)    No energy, just felt bad  Pravastatin Other (See Comments)    myalgias   Colchicine     Due to renal function- can use prednisone if needed    Hydrocodone Other (See Comments)    "swimmy headed" after use   Nsaids     Due to renal function   Uloric [Febuxostat] Other (See Comments)    Would avoid, worsening gout sx with use.    Vancomycin Other (See Comments)    Red man syndrome- tolerated with slow run and benadryl use   Biaxin [Clarithromycin] Diarrhea   Doxycycline Other (See Comments)    GI upset   Prior to Admission medications   Medication Sig Start Date End Date Taking? Authorizing Provider  acetaminophen (TYLENOL) 500 MG tablet Take 1,000 mg by mouth as needed for mild pain.    [provider]  allopurinol (ZYLOPRIM) 100 MG tablet TAKE 1 TABLET BY MOUTH EVERY DAY 10/25/20   Tonia Ghent, MD  baclofen (LIORESAL) 10 MG tablet TAKE 0.5-1 TABLETS (5-10 MG TOTAL) BY MOUTH AT BEDTIME AS NEEDED FOR MUSCLE SPASMS. 10/21/20   Hilts, Legrand Como, MD  clindamycin (CLEOCIN) 300 MG capsule Take 1 capsule (300 mg total) by mouth 3 (three) times daily. 11/01/20   Hilts, Legrand Como, MD  cyanocobalamin (,VITAMIN B-12,) 1000 MCG/ML injection 1058mcg IM every 28 days 09/13/20   Tonia Ghent, MD  diltiazem [facility] CD) 180 MG 24 hr capsule TAKE 1 CAPSULE BY MOUTH EVERY DAY 07/26/20   Sherren Mocha, MD  fluticasone [facility]) 50 MCG/ACT nasal spray Place 2 sprays into both nostrils daily. 09/13/20   Tonia Ghent, MD  furosemide (LASIX) 20 MG tablet TAKE 1  TABLET BY MOUTH DAILY. PLEASE MAKE YEARLY APPT WITH DR. Burt Knack FOR MAY BEFORE ANYMORE REFILLS 10/25/20   Sherren Mocha, MD  lidocaine (LIDODERM) 5 % Place 1 patch onto the skin daily. Remove & Discard patch within 12 hours or as directed by MD 09/17/20   Lacretia Leigh, MD  lisinopril (ZESTRIL) 10 MG tablet Take 1 tablet (10 mg total) by mouth daily. 09/13/20   Tonia Ghent, MD  metoprolol tartrate (LOPRESSOR) 25 MG tablet TAKE 1 TABLET BY MOUTH TWICE A DAY 08/23/20   Sherren Mocha, MD  omeprazole (PRILOSEC) 40 MG capsule TAKE 1 CAPSULE BY MOUTH EVERY DAY 08/27/20   Tonia Ghent, MD  predniSONE (DELTASONE) 10 MG tablet Take as directed for 12 days.  Daily dose 6,6,5,5,4,4,3,3,2,2,1,1. 11/01/20   Hilts, Legrand Como, MD  tamsulosin (FLOMAX) 0.4 MG CAPS capsule Take 1 capsule (0.4 mg total) by mouth daily. 09/13/20   Tonia Ghent, MD  traMADol (ULTRAM) 50 MG tablet Take 1 tablet (50 mg total) by mouth every 6 (six) hours as needed. 09/17/20   Hilts, Legrand Como, MD  warfarin (COUMADIN) 4 MG tablet Take 1 to 1 and 1/2 tablets by mouth daily or as directed by Coumadin Clinic 09/17/20   Sherren Mocha, MD     Positive ROS: Otherwise negative  All other systems have been reviewed and were otherwise negative with the exception of those mentioned in the HPI and as above.  Physical Exam: Constitutional: Alert, well-appearing, no acute distress Ears: External ears without lesions or tenderness. Ear canals with moderate wax buildup on both sides that was cleaned with suction.  TMs were otherwise clear with no middle ear effusion noted and good TM mobility on pneumatic otoscopy.  Hearing screening with the 1024 tuning fork revealed mild to moderate hearing loss in both ears with a 1024 tuning fork subjectively. Nasal:  External nose without lesions. Septum with mild deformity and moderate rhinitis.  After decongesting the nose nasal endoscopy was performed nasal endoscopy both middle meatus regions were clear no  mucopurulent discharge noted minimal swelling.  No polyps.  The nasopharynx was clear.  Eustachian tubes are unobstructed..  No clinical evidence of infection. Oral: Lips and gums without lesions. Tongue and palate mucosa without lesions. Posterior oropharynx clear.  Symmetric appearing tonsils.  Elongated uvula. Neck: No palpable adenopathy or masses Respiratory: Breathing comfortably  Skin: No facial/neck lesions or rash noted.  Cerumen impaction removal  Date/Time: 12/11/2020 11:31 AM Performed by: Rozetta Nunnery, MD Authorized by: Rozetta Nunnery, MD   Consent:    Consent obtained:  Verbal   Consent given by:  Patient   Risks discussed:  Pain and bleeding Procedure details:    Location:  L ear and R ear   Procedure type: suction   Post-procedure details:    Inspection:  TM intact and canal normal   Hearing quality:  Improved   Procedure completion:  Tolerated well, no immediate complications Comments:     Moderate wax buildup in both ear canals that was cleaned with suction.  TMs were clear bilaterally.  Assessment: Chronic rhinitis with no clinical evidence of active infection. Cerumen buildup in both ears.  Plan: Ear canals were cleaned in the office with clear TMs bilaterally. Concerning nasal sinus issues with postnasal drainage recommended regular use of nasal steroid spray and prescribed Nasacort 2 sprays each nostril at night and to use this on a regular basis.  During the daytime recommended use of saline irrigation if he is having a lot of postnasal drainage and gave him a sample of Xlear saline rinse that he can use on a as needed basis. Briefly discussed with him concerning mild hearing loss and if he is having trouble with his hearing would recommend obtaining audiologic testing and possible hearing aids.   Radene Journey, MD   CC:

## 2020-12-11 NOTE — Telephone Encounter (Signed)
Prescription refill request received for warfarin Lov: 08/15/20 Burt Knack)  Next INR check: 9/30 Warfarin tablet strength: 4mg   Appropriate dose and refill sent to requested pharmacy.

## 2020-12-13 ENCOUNTER — Ambulatory Visit (INDEPENDENT_AMBULATORY_CARE_PROVIDER_SITE_OTHER): Payer: Medicare Other | Admitting: *Deleted

## 2020-12-13 ENCOUNTER — Other Ambulatory Visit: Payer: Self-pay

## 2020-12-13 DIAGNOSIS — I4819 Other persistent atrial fibrillation: Secondary | ICD-10-CM | POA: Diagnosis not present

## 2020-12-13 DIAGNOSIS — Z5181 Encounter for therapeutic drug level monitoring: Secondary | ICD-10-CM | POA: Diagnosis not present

## 2020-12-13 LAB — POCT INR: INR: 2.6 (ref 2.0–3.0)

## 2020-12-13 NOTE — Patient Instructions (Addendum)
Description   Continue taking Warfarin 1 tablet daily except 1.5 tablets on Sundays and Thursdays. Recheck INR in 6 weeks. Call Coumadin clinic for any changes in medications or up coming procedures. [phone].       

## 2020-12-16 ENCOUNTER — Ambulatory Visit (INDEPENDENT_AMBULATORY_CARE_PROVIDER_SITE_OTHER): Payer: Medicare Other | Admitting: Orthopedic Surgery

## 2020-12-16 ENCOUNTER — Ambulatory Visit: Payer: Self-pay

## 2020-12-16 DIAGNOSIS — I2510 Atherosclerotic heart disease of native coronary artery without angina pectoris: Secondary | ICD-10-CM | POA: Diagnosis not present

## 2020-12-16 DIAGNOSIS — M25572 Pain in left ankle and joints of left foot: Secondary | ICD-10-CM | POA: Diagnosis not present

## 2020-12-17 LAB — URIC ACID: Uric Acid, Serum: 7.6 mg/dL (ref 4.0–8.0)

## 2020-12-18 ENCOUNTER — Telehealth: Payer: Self-pay

## 2020-12-18 ENCOUNTER — Encounter: Payer: Self-pay | Admitting: Neurology

## 2020-12-18 NOTE — Telephone Encounter (Signed)
-----   Message from [name], MD sent at 12/17/2020  5:40 PM EDT ----- Uric acid still slightly elevated at 7.  Recommend diet modification increase water intake and will discuss medical treatment options at follow-up ----- Message ----- From: Interface, [facility] Lab Results In Sent: 12/17/2020   1:18 AM EDT To: [name], MD

## 2020-12-18 NOTE — Telephone Encounter (Signed)
Called pt to advise of results. Voiced understanding and will call with any questions.

## 2020-12-23 ENCOUNTER — Other Ambulatory Visit: Payer: Self-pay

## 2020-12-23 ENCOUNTER — Ambulatory Visit (INDEPENDENT_AMBULATORY_CARE_PROVIDER_SITE_OTHER): Payer: Medicare Other | Admitting: Orthopedic Surgery

## 2020-12-23 ENCOUNTER — Ambulatory Visit (INDEPENDENT_AMBULATORY_CARE_PROVIDER_SITE_OTHER): Payer: Medicare Other

## 2020-12-23 DIAGNOSIS — M79671 Pain in right foot: Secondary | ICD-10-CM

## 2020-12-23 DIAGNOSIS — M1A0710 Idiopathic chronic gout, right ankle and foot, without tophus (tophi): Secondary | ICD-10-CM | POA: Diagnosis not present

## 2020-12-23 DIAGNOSIS — I2510 Atherosclerotic heart disease of native coronary artery without angina pectoris: Secondary | ICD-10-CM | POA: Diagnosis not present

## 2020-12-23 MED ORDER — SULFAMETHOXAZOLE-TRIMETHOPRIM 800-160 MG PO TABS: 1.0000 | ORAL_TABLET | Freq: Two times a day (BID) | ORAL | 0 refills | Status: DC

## 2020-12-23 NOTE — Progress Notes (Signed)
Office Visit Note   Patient: Nathan Nathan           Date of Birth: May 13, 1930           MRN: [id number] Visit Date: 12/23/2020              Requested by: Tonia Ghent, MD [address] [place],  [place] [address] PCP: Tonia Ghent, MD  Chief Complaint  Patient presents with   Right Foot - Edema, Pain      HPI: Patient is a 87 year old gentleman with chronic history of gout he presents with ulceration redness and swelling of the right foot third toe.  Patient has had allergies to Uloric and colchicine currently taking 1 allopurinol tablet a day.  Patient states he has had a history of kidney disease.  A little most recent uric acid 7.6.  Assessment & Plan: Visit Diagnoses:  1. Pain in right foot     Plan: Chart for recommend patient increase the allopurinol to twice a day he is given a prescription for Bactrim DS.  You needed    Follow-Up Instructions: No follow-ups on file.   Ortho Exam  Patient is alert, oriented, no adenopathy, well-dressed, normal affect, normal respiratory effort. Examination patient has a palpable dorsalis pedis pulse.  Ankle-brachial indices showed adequate circulation however he does have calcified arteries.  The periarticular cystic changes in the right forefoot seem most consistent with gout.  Patient states that he is taken off colchicine in the past due to his kidney disease.  His BUN is 35 creatinine 1.2.  Hemoglobin A1c 7.2 dhe will most recent uric acid 7.6  Imaging: No results found. No images are attached to the encounter.  Labs: Lab Results  Component Value Date   HGBA1C 7.2 (H) 07/23/2020   HGBA1C 7.7 (H) 09/11/2019   HGBA1C 6.8 (H) 05/16/2019   ESRSEDRATE 17 (H) 09/09/2017   CRP 1.1 (H) 09/09/2017   LABURIC 7.6 12/16/2020   LABURIC 7.5 07/23/2020   LABURIC 8.1 (H) 11/23/2019   REPTSTATUS 09/19/2017 FINAL 09/14/2017   GRAMSTAIN  09/10/2017    MODERATE WBC PRESENT,BOTH PMN AND MONONUCLEAR NO ORGANISMS  SEEN    CULT  09/14/2017    NO GROWTH 5 DAYS Performed at [facility], [place] [address] [place], [place] [address]      Lab Results  Component Value Date   ALBUMIN 4.0 09/17/2020   ALBUMIN 4.2 07/23/2020   ALBUMIN 4.2 05/16/2019    Lab Results  Component Value Date   MG 2.2 09/09/2017   No results found for: VD25OH  No results found for: PREALBUMIN CBC EXTENDED Latest Ref Rng & Units 09/17/2020 07/23/2020 09/11/2019  WBC 4.0 - 10.5 K/uL 6.0 8.1 9.2  RBC 4.22 - 5.81 MIL/uL 5.79 5.57 5.23  HGB 13.0 - 17.0 g/dL 17.5(H) 16.2 15.7  HCT 39.0 - 52.0 % 51.3 49.3 46.7  PLT 150 - 400 K/uL 136(L) 101.0 Repeated and verified X2.(L) 126.0 Repeated and verified X2.(L)  NEUTROABS 1.7 - 7.7 K/uL 4.1 5.4 6.0  LYMPHSABS 0.7 - 4.0 K/uL 1.3 1.7 2.3     There is no height or weight on file to calculate BMI.  Orders:  Orders Placed This Encounter  Procedures   XR Foot 2 Views Right   Meds ordered this encounter  Medications   sulfamethoxazole-trimethoprim (BACTRIM DS) 800-160 MG tablet    Sig: Take 1 tablet by mouth 2 (two) times daily.    Dispense:  20 tablet    Refill:  0     Procedures: No procedures performed  Clinical Data: No additional findings.  ROS:  All other systems negative, except as noted in the HPI. Review of Systems  Objective: Vital Signs: There were no vitals taken for this visit.  Specialty Comments:  No specialty comments available.  PMFS History: Patient Active Problem List   Diagnosis Date Noted   Lower urinary tract symptoms (LUTS) 09/16/2020   Creatinine elevation 07/31/2020   Drug-induced myopathy 07/31/2020   Diabetic polyneuropathy associated with type 2 diabetes mellitus ([place]) 06/20/2020   Gouty arthritis of left foot 06/20/2020   Diabetic peripheral neuropathy ([place]) 10/04/2019   Healthcare maintenance 05/22/2019   B12 deficiency 05/17/2019   Idiopathic chronic gout, unspecified site, without tophus (tophi) 75/12/2583   Acute  diastolic heart failure (HCC)    CAD S/P percutaneous coronary angioplasty    Chronic atrial fibrillation (HCC)    CKD (chronic kidney disease), stage III ([place])    Hypertension    Hyperlipidemia    Back pain 05/24/2015   Eustachian tube dysfunction 05/24/2015   Dysphagia, pharyngoesophageal phase 09/25/2014   History of esophageal stricture 09/25/2014   Chronic anticoagulation 09/25/2014   Esophageal stricture from GERD 06/08/2011   Advance care planning 04/20/2011   OSA on CPAP 09/22/2010   Gout 08/15/2008   OBESITY 08/15/2008   Essential hypertension 08/15/2008   CAD (coronary artery disease), native coronary artery 08/15/2008   GASTROESOPHAGEAL REFLUX DISEASE 08/15/2008   Past Medical History:  Diagnosis Date   Bradycardia    a. nocturnal with pauses overnight on tele likely due to OSA 10/2015.   Cancer [facility])    skin cancer   CKD (chronic kidney disease), stage III (HCC)    Coronary artery disease    a. prev nonobst. b. LHC 10/24/15: occlusion of small diffusely diseased OM2 s/p balloon angioplasty, mild nonobstructive disease of mLAD and mRCA, normal LVEDP   Diabetes mellitus without complication (HCC)    Diabetic peripheral neuropathy ([place]) 10/04/2019   Diverticulosis    Esophageal reflux    Esophageal stricture from GERD 06/08/2011   With stricture at GE junction on EGD, dilated 06/2011    Gastric ulcer    on EGD 2013   Gout, unspecified    "on daily RX" (09/09/2017)   History of blood transfusion 02/1964   "when I had right little finger cut off"   Hyperlipidemia    Hypertension    Obesity, unspecified    Orthopnea    OSA on CPAP    Paroxysmal nocturnal dyspnea    Persistent atrial fibrillation (HCC)    Vertigo     Family History  Problem Relation Age of Onset   Stomach cancer Mother        died in 71's    Hypertension Father        died in his 71's pna likely dementia   Dementia Father    Pneumonia Father    Heart disease Brother        S/P CABG    Hypertension Other    Sleep apnea Daughter    Prostate cancer Neg Hx    Colon cancer Neg Hx     Past Surgical History:  Procedure Laterality Date   AMPUTATION Right 03/03/2019   Procedure: RIGHT 2ND TOE AMPUTATION;  Surgeon: [name], MD;  Location: [place];  Service: Orthopedics;  Laterality: Right;   BALLOON DILATION N/A 10/22/2014   Procedure: BALLOON DILATION;  Surgeon: Glendell Docker  Simonne Maffucci, MD;  Location: [facility];  Service: Endoscopy;  Laterality: N/A;   CARDIAC CATHETERIZATION  07/2008   CARDIAC CATHETERIZATION N/A 10/24/2015   Procedure: Left Heart Cath and Coronary Angiography;  Surgeon: [name], MD;  Location: [place] [facility];  Service: Cardiovascular;  Laterality: N/A;   CARDIAC CATHETERIZATION N/A 10/24/2015   Procedure: Coronary Balloon Angioplasty;  Surgeon: [name], MD;  Location: [place] [facility];  Service: Cardiovascular;  Laterality: N/A;   Carotid Dopplers  09/2009   no sig extracranial stenosis and vertebral arteries had antegrade flow   CATARACT EXTRACTION W/ INTRAOCULAR LENS  IMPLANT, BILATERAL Bilateral    ESOPHAGOGASTRODUODENOSCOPY (EGD) WITH ESOPHAGEAL DILATION     "I've had it stretched 3 times" (10/23/2015)   ESOPHAGOGASTRODUODENOSCOPY (EGD) WITH ESOPHAGEAL DILATION  06/2011   /medical hx above  (09/09/2017)   ESOPHAGOGASTRODUODENOSCOPY (EGD) WITH PROPOFOL N/A 10/22/2014   Procedure: ESOPHAGOGASTRODUODENOSCOPY (EGD) WITH PROPOFOL;  Surgeon: [name], MD;  Location[facility];  Service: Endoscopy;  Laterality: N/A;   FINGER SURGERY Right 02/1964   "cut little finger off; had it reattached"   I & D EXTREMITY Right 09/10/2017   Procedure: RIGHT FOOT DEBRIDEMENT;  Surgeon: [name], MD;  Location: [place] [place];  Service: Orthopedics;  Laterality: Right;   MRI of brain  09/2009   chronic microvascular ischemia   OSA Sleep Study     per Dr. 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

## 2020-12-30 ENCOUNTER — Other Ambulatory Visit: Payer: Self-pay

## 2020-12-30 ENCOUNTER — Ambulatory Visit (INDEPENDENT_AMBULATORY_CARE_PROVIDER_SITE_OTHER): Payer: Medicare Other | Admitting: Orthopedic Surgery

## 2020-12-30 ENCOUNTER — Encounter: Payer: Self-pay | Admitting: Orthopedic Surgery

## 2020-12-30 DIAGNOSIS — M1A0711 Idiopathic chronic gout, right ankle and foot, with tophus (tophi): Secondary | ICD-10-CM

## 2020-12-30 DIAGNOSIS — I2510 Atherosclerotic heart disease of native coronary artery without angina pectoris: Secondary | ICD-10-CM

## 2020-12-30 NOTE — Progress Notes (Signed)
Office Visit Note   Patient: [name]           Date of Birth: [birth year]           MRN: [id number] Visit Date: 12/30/2020              Requested by: Tonia Ghent, MD [address] [place],  [place] [address] PCP: Tonia Ghent, MD  Chief Complaint  Patient presents with   Right Foot - Pain, Follow-up      pain he would like to proceed HPI: Patient is an 87 year old gentleman who presents in follow-up for toph we will plan for surgery on Friday with right foot third toe amputation at the MTP joint.  Plan for outpatient surgery.  Aceous gout and swelling right foot third toe he is on allopurinol twice a day he is started Bactrim DS for the redness and swelling.  Assessment & Plan: Visit Diagnoses:  1. 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   Ortho Exam  Patient is alert, oriented, no adenopathy, well-dressed, normal affect, normal respiratory effort. Examination patient has palpable pulses.  He has swelling and dermatitis of the third toe right foot status post amputation of the second toe.  There is toe faces drainage from the PIP joint without purulence.  Imaging: No results found. No images are attached to the encounter.  Labs: Lab Results  Component Value Date   HGBA1C 7.2 (H) 07/23/2020   HGBA1C 7.7 (H) 09/11/2019   HGBA1C 6.8 (H) 05/16/2019   ESRSEDRATE 17 (H) 09/09/2017   CRP 1.1 (H) 09/09/2017   LABURIC 7.6 12/16/2020   LABURIC 7.5 07/23/2020   LABURIC 8.1 (H) 11/23/2019   REPTSTATUS 09/19/2017 FINAL 09/14/2017   GRAMSTAIN  09/10/2017    MODERATE WBC PRESENT,BOTH PMN AND MONONUCLEAR NO ORGANISMS SEEN    CULT  09/14/2017    NO GROWTH 5 DAYS Performed at [facility], [place] [address], [place], [place] [address]      Lab Results  Component Value Date   ALBUMIN 4.0 09/17/2020   ALBUMIN 4.2 07/23/2020    ALBUMIN 4.2 05/16/2019    Lab Results  Component Value Date   MG 2.2 09/09/2017   No results found for: VD25OH  No results found for: PREALBUMIN CBC EXTENDED Latest Ref Rng & Units 09/17/2020 07/23/2020 09/11/2019  WBC 4.0 - 10.5 K/uL 6.0 8.1 9.2  RBC 4.22 - 5.81 MIL/uL 5.79 5.57 5.23  HGB 13.0 - 17.0 g/dL 17.5(H) 16.2 15.7  HCT 39.0 - 52.0 % 51.3 49.3 46.7  PLT 150 - 400 K/uL 136(L) 101.0 Repeated and verified X2.(L) 126.0 Repeated and verified X2.(L)  NEUTROABS 1.7 - 7.7 K/uL 4.1 5.4 6.0  LYMPHSABS 0.7 - 4.0 K/uL 1.3 1.7 2.3     There is no height or weight on file to calculate BMI.  Orders:  No orders of the defined types were placed in this encounter.  No orders of the defined types were placed in this encounter.    Procedures: No procedures performed  Clinical Data: No additional findings.  ROS:  All other systems negative, except as noted in the HPI. Review of Systems  Objective: Vital Signs: There were no vitals taken for this visit.  Specialty Comments:  No specialty comments available.  PMFS History: Patient Active Problem List   Diagnosis Date Noted   Lower  urinary tract symptoms (LUTS) 09/16/2020   Creatinine elevation 07/31/2020   Drug-induced myopathy 07/31/2020   Diabetic polyneuropathy associated with type 2 diabetes mellitus ([place]) 06/20/2020   Gouty arthritis of left foot 06/20/2020   Diabetic peripheral neuropathy ([place]) 10/04/2019   Healthcare maintenance 05/22/2019   B12 deficiency 05/17/2019   Idiopathic chronic gout, unspecified site, without tophus (tophi) 33/29/5188   Acute diastolic heart failure (HCC)    CAD S/P percutaneous coronary angioplasty    Chronic atrial fibrillation ([place])    CKD (chronic kidney disease), stage III ([place])    Hypertension    Hyperlipidemia    Back pain 05/24/2015   Eustachian tube dysfunction 05/24/2015   Dysphagia, pharyngoesophageal phase 09/25/2014   History of esophageal stricture 09/25/2014   Chronic  anticoagulation 09/25/2014   Esophageal stricture from GERD 06/08/2011   Advance care planning 04/20/2011   OSA on CPAP 09/22/2010   Gout 08/15/2008   OBESITY 08/15/2008   Essential hypertension 08/15/2008   CAD (coronary artery disease), native coronary artery 08/15/2008   GASTROESOPHAGEAL REFLUX DISEASE 08/15/2008   Past Medical History:  Diagnosis Date   Bradycardia    a. nocturnal with pauses overnight on tele likely due to OSA 10/2015.   Cancer [facility])    skin cancer   CKD (chronic kidney disease), stage III (HCC)    Coronary artery disease    a. prev nonobst. b. LHC 10/24/15: occlusion of small diffusely diseased OM2 s/p balloon angioplasty, mild nonobstructive disease of mLAD and mRCA, normal LVEDP   Diabetes mellitus without complication (HCC)    Diabetic peripheral neuropathy ([place]) 10/04/2019   Diverticulosis    Esophageal reflux    Esophageal stricture from GERD 06/08/2011   With stricture at GE junction on EGD, dilated 06/2011    Gastric ulcer    on EGD 2013   Gout, unspecified    "on daily RX" (09/09/2017)   History of blood transfusion 02/1964   "when I had right little finger cut off"   Hyperlipidemia    Hypertension    Obesity, unspecified    Orthopnea    OSA on CPAP    Paroxysmal nocturnal dyspnea    Persistent atrial fibrillation (HCC)    Vertigo     Family History  Problem Relation Age of Onset   Stomach cancer Mother        died in 74's    Hypertension Father        died in his 70's pna likely dementia   Dementia Father    Pneumonia Father    Heart disease Brother        S/P CABG   Hypertension Other    Sleep apnea Daughter    Prostate cancer Neg Hx    Colon cancer Neg Hx     Past Surgical History:  Procedure Laterality Date   AMPUTATION Right 03/03/2019   Procedure: RIGHT 2ND TOE AMPUTATION;  Surgeon: [name], MD;  Location: [place];  Service: Orthopedics;  Laterality: Right;   BALLOON DILATION N/A 10/22/2014   Procedure: BALLOON DILATION;   Surgeon: [name], MD;  [facility];  Service: Endoscopy;  Laterality: N/A;   CARDIAC CATHETERIZATION  07/2008   CARDIAC CATHETERIZATION N/A 10/24/2015   Procedure: Left Heart Cath and Coronary Angiography;  Surgeon: [name], MD;  Location: [place] [facility];  Service: Cardiovascular;  Laterality: N/A;   CARDIAC CATHETERIZATION N/A 10/24/2015   Procedure: Coronary Balloon Angioplasty;  Surgeon: [name], MD;  Location: [facility];  Service: Cardiovascular;  Laterality: N/A;   Carotid Dopplers  09/2009   no sig extracranial stenosis and vertebral arteries had antegrade flow   CATARACT EXTRACTION W/ INTRAOCULAR LENS  IMPLANT, BILATERAL Bilateral    ESOPHAGOGASTRODUODENOSCOPY (EGD) WITH ESOPHAGEAL DILATION     "I've had it stretched 3 times" (10/23/2015)   ESOPHAGOGASTRODUODENOSCOPY (EGD) WITH ESOPHAGEAL DILATION  06/2011   /medical hx above  (09/09/2017)   ESOPHAGOGASTRODUODENOSCOPY (EGD) WITH PROPOFOL N/A 10/22/2014   Procedure: ESOPHAGOGASTRODUODENOSCOPY (EGD) WITH PROPOFOL;  Surgeon: [name], MD;  [facility];  Service: Endoscopy;  Laterality: N/A;   FINGER SURGERY Right 02/1964   "cut little finger off; had it reattached"   I & D EXTREMITY Right 09/10/2017   Procedure: RIGHT FOOT DEBRIDEMENT;  Surgeon: [name], MD;  Location: [place];  Service: Orthopedics;  Laterality: Right;   MRI of brain  09/2009   chronic microvascular ischemia   OSA Sleep Study     per Dr. 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

## 2021-01-02 ENCOUNTER — Other Ambulatory Visit: Payer: Self-pay

## 2021-01-02 ENCOUNTER — Encounter (HOSPITAL_COMMUNITY): Payer: Self-pay | Admitting: Orthopedic Surgery

## 2021-01-02 NOTE — Anesthesia Preprocedure Evaluation (Addendum)
Anesthesia Evaluation  Patient identified by MRN, date of birth, ID band Patient awake    Reviewed: Allergy & Precautions, NPO status , Patient's Chart, lab work & pertinent test results  Airway Mallampati: II       Dental   Pulmonary sleep apnea , pneumonia, former smoker,    breath sounds clear to auscultation       Cardiovascular hypertension, + CAD and + Orthopnea   Rhythm:Regular Rate:Normal     Neuro/Psych  Neuromuscular disease    GI/Hepatic PUD, GERD  ,  Endo/Other  diabetes  Renal/GU Renal disease     Musculoskeletal   Abdominal   Peds  Hematology   Anesthesia Other Findings   Reproductive/Obstetrics                           Anesthesia Physical Anesthesia Plan  ASA: 3  Anesthesia Plan: MAC   Post-op Pain Management:    Induction: Intravenous  PONV Risk Score and Plan: 2 and Ondansetron  Airway Management Planned: Nasal Cannula and Simple Face Mask  Additional Equipment:   Intra-op Plan:   Post-operative Plan:   Informed Consent: I have reviewed the patients History and Physical, chart, labs and discussed the procedure including the risks, benefits and alternatives for the proposed anesthesia with the patient or authorized representative who has indicated his/her understanding and acceptance.     Dental advisory given  Plan Discussed with: Anesthesiologist and CRNA  Anesthesia Plan Comments: (PAT note written 01/02/2021 by Myra Gianotti, PA-C. )      Anesthesia Quick Evaluation

## 2021-01-02 NOTE — Progress Notes (Signed)
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 Patient seen by Dr. Sharol Given on 12/30/20.    History includes former smoker (quit 1971), CAD (s/p PTCA OM2, no stent due to small size and diffuse disease 10/24/15), chronic afib (on warfarin), bradycardia (had bradycardia with pauses 2010, reduced metoprolol and diltiazem with improvement; OSA possibly a contributing factor), aortic stenosis, HTN, HLD, DM2, osteomyelitis (s/p right 2nd toe amputation 03/03/19), CKD (stage III), GERD, vertigo, OSA (uses BiPAP), obesity.   Was last evaluated by cardiologist Dr. Burt Knack on 08/15/2020.  No chest pain. Afib rate controlled. Echo showed vigorous LV function, moderate aortic stenosis. One year follow-up planned.  Known CKD stage III followed by Dr. Candiss Norse. Creatinine had been ~ 1.3-1.50, but peak Creatinine 2.20 on 07/23/20. Down to 1.22 on 09/17/20.    He is on warfarin for chronic afib. INR 2.6 on 12/13/20. PAT phone RN to inquire about any preoperative instructions when she speaks with patient. He is for labs and anesthesia team evaluation on the day of surgery.    VS:  BP Readings from Last 3 Encounters:  12/05/20 128/79  09/17/20 (!) 184/96  09/13/20 116/68   Pulse Readings from Last 3 Encounters:  12/05/20 (!) 103  09/17/20 (!) 106  09/13/20 97     PROVIDERS: Tonia Ghent, MD is PCP - Sherren Mocha, MD is cardiologist - Servando Snare, MD is vascular surgeon - Dohmeier, Asencion Partridge, MD is neurologist - Gean Quint, MD is nephrologist. 12/09/20 note scanned under Media tab. CKD felt likely due to hypertensive return nephro sclerosis, age-related changes, possible  renovascular disease.  Previous AKI event was likely related to relative hypotension, but was now off Mobic and lisinopril temporarily held. Peak Cr 2.20 07/23/20, down to 1.22 on 09/17/20.    LABS: For day of surgery as indicated. Currently, most recent results include: Lab Results  Component Value Date   WBC 6.0 09/17/2020   HGB 17.5 (H) 09/17/2020   HCT 51.3 09/17/2020   PLT 136 (L) 09/17/2020   GLUCOSE 116 (H) 09/17/2020   ALT 24 09/17/2020   AST 26 09/17/2020   NA 135 09/17/2020   K 4.3 09/17/2020   CL 100 09/17/2020   CREATININE 1.22 09/17/2020   BUN 15 09/17/2020   CO2 26 09/17/2020   INR 2.6 12/13/2020   HGBA1C 7.2 (H) 07/23/2020      IMAGES: US Renal 09/24/20: IMPRESSION: Small RIGHT renal cyst 13 mm diameter. Otherwise negative exam.  CXR 09/17/20: FINDINGS: The heart size and mediastinal contours are within normal limits. No pneumothorax or pleural effusion is noted. Stable multiple calcified granulomata are noted. No acute pulmonary disease is noted. The visualized skeletal structures are unremarkable. IMPRESSION: No active cardiopulmonary disease.    EKG: EKG 09/17/20: Read as ST with PACs. I think it looks like afib with RVR at 122 bpm. (EKG was done in ED for evaluation of right sided back and abdominal pain.  Abdominal CT without acute findings.  Pain somewhat positional, and he was discharged home on anti-inflammatories and muscle relaxants.)  He has known chronic afib.   EKG 08/15/20 ([facility]): Afib at 78 bpm  CV: Echo 08/15/20: IMPRESSIONS   1. Left ventricular ejection fraction, by estimation, is 65 to 70%. The  left ventricle has normal function. The left ventricle has no regional  wall motion abnormalities. Left ventricular diastolic function could not  be evaluated. Elevated left  ventricular end-diastolic pressure. The E/e' is 21.   2. Right ventricular systolic function is mildly reduced. The right  ventricular size is normal. There is  moderately elevated pulmonary artery  systolic pressure.   3. Left atrial size was moderately dilated.   4. Right atrial size was mildly dilated.   5. The mitral valve is abnormal. Trivial mitral valve regurgitation. Mild  to moderate mitral stenosis. The mean mitral valve gradient is 3.7 mmHg  with average heart rate of 60 bpm. Severe mitral annular calcification.   6. The aortic valve is calcified. There is severe calcifcation of the  aortic valve. There is severe thickening of the aortic valve. Aortic valve  regurgitation is not visualized. Moderate aortic valve stenosis. Aortic  valve mean gradient measures 21.5  mmHg. Aortic valve Vmax measures 3.00 m/s.   7. The inferior vena cava is dilated in size with <50% respiratory  variability, suggesting right atrial pressure of 15 mmHg.  - Comparison(s): Changes from prior study are noted. Similar AV gradient and  velocity to prior study; difficult VTI tracings but average AVA >1.    Cardiac cath 10/24/15: Mid RCA lesion, 15 %stenosed. Ost 2nd Mrg to 2nd Mrg lesion, 50 %stenosed. Mid LAD lesion, 30 %stenosed. 2nd Mrg lesion, 100 %stenosed. Post intervention, there is a 30% residual stenosis. LV end diastolic pressure is normal. 1.  Occlusion of small, diffusely diseased OM2 branch. 2.  Mild, non-obstructive coronary artery disease involving the mid LAD and mid RCA. 3.  Normal left ventricular filling pressure (LVEDP 13 mmHg). 4.  Successful balloon angioplasty of OM2 using 2.0 x 12 mm balloon with improvement in stenosis from 100% to 30% with restoration of TIMI-3 flow.  Stent was not placed due to the small vessel size with diffuse disease extending to the ostium.    Past Medical History:  Diagnosis Date   Aortic stenosis    Bradycardia    a. nocturnal with pauses overnight on tele likely due to OSA 10/2015.   Cancer ([place])    skin cancer   CKD (chronic kidney disease), stage III (HCC)    Coronary artery disease    a. prev  nonobst. b. LHC 10/24/15: occlusion of small diffusely diseased OM2 s/p balloon angioplasty, mild nonobstructive disease of mLAD and mRCA, normal LVEDP   Diabetes mellitus without complication ([place])    Diabetic peripheral neuropathy ([place]) 10/04/2019   Diverticulosis    Esophageal reflux    Esophageal stricture from GERD 06/08/2011   With stricture at GE junction on EGD, dilated 06/2011    Gastric ulcer    on EGD 2013   Gout, unspecified    "on daily RX" (09/09/2017)   History of blood transfusion 02/1964   "when I had right little finger cut off"   Hyperlipidemia    Hypertension    Obesity, unspecified    Orthopnea    OSA on CPAP    Paroxysmal nocturnal dyspnea    Persistent atrial fibrillation ([place])    Vertigo     Past Surgical History:  Procedure Laterality Date   AMPUTATION Right 03/03/2019   Procedure: RIGHT 2ND TOE AMPUTATION;  Surgeon: [name], MD;  Location: [place];  Service: Orthopedics;  Laterality: Right;  BALLOON DILATION N/A 10/22/2014   Procedure: BALLOON DILATION;  Surgeon: [name], MD;  [facility];  Service: Endoscopy;  Laterality: N/A;   CARDIAC CATHETERIZATION  07/2008   CARDIAC CATHETERIZATION N/A 10/24/2015   Procedure: Left Heart Cath and Coronary Angiography;  Surgeon: [name], MD;  Location: [place] [facility];  Service: Cardiovascular;  Laterality: N/A;   CARDIAC CATHETERIZATION N/A 10/24/2015   Procedure: Coronary Balloon Angioplasty;  Surgeon: [name], MD;  Location: [place] [facility];  Service: Cardiovascular;  Laterality: N/A;   Carotid Dopplers  09/2009   no sig extracranial stenosis and vertebral arteries had antegrade flow   CATARACT EXTRACTION W/ INTRAOCULAR LENS  IMPLANT, BILATERAL Bilateral    ESOPHAGOGASTRODUODENOSCOPY (EGD) WITH ESOPHAGEAL DILATION     "I've had it stretched 3 times" (10/23/2015)   ESOPHAGOGASTRODUODENOSCOPY (EGD) WITH ESOPHAGEAL DILATION  06/2011   /medical hx above  (09/09/2017)    ESOPHAGOGASTRODUODENOSCOPY (EGD) WITH PROPOFOL N/A 10/22/2014   Procedure: ESOPHAGOGASTRODUODENOSCOPY (EGD) WITH PROPOFOL;  Surgeon: [name], MD;  [facility];  Service: Endoscopy;  Laterality: N/A;   FINGER SURGERY Right 02/1964   "cut little finger off; had it reattached"   I & D EXTREMITY Right 09/10/2017   Procedure: RIGHT FOOT DEBRIDEMENT;  Surgeon: [name], MD;  Location: [facility];  Service: Orthopedics;  Laterality: Right;   MRI of brain  09/2009   chronic microvascular ischemia   OSA Sleep Study     per Dr. Brett Fairy   SHOULDER ARTHROSCOPY W/ ROTATOR CUFF REPAIR Right 2008    MEDICATIONS: No current facility-administered medications for this encounter.    acetaminophen (TYLENOL) 500 MG tablet   allopurinol (ZYLOPRIM) 100 MG tablet   baclofen (LIORESAL) 10 MG tablet   cyanocobalamin (,VITAMIN B-12,) 1000 MCG/ML injection   diltiazem (CARDIZEM CD) 180 MG 24 hr capsule   furosemide (LASIX) 20 MG tablet   lisinopril (ZESTRIL) 10 MG tablet   metoprolol tartrate (LOPRESSOR) 25 MG tablet   omeprazole (PRILOSEC) 40 MG capsule   sodium chloride (OCEAN) 0.65 % SOLN nasal spray   tamsulosin (FLOMAX) 0.4 MG CAPS capsule   triamcinolone (NASACORT) 55 MCG/ACT AERO nasal inhaler   warfarin (COUMADIN) 4 MG tablet   clindamycin (CLEOCIN) 300 MG capsule   fluticasone (FLONASE) 50 MCG/ACT nasal spray   predniSONE (DELTASONE) 10 MG tablet   sulfamethoxazole-trimethoprim (BACTRIM DS) 800-160 MG tablet   traMADol (ULTRAM) 50 MG tablet  - He is not currently taking clindamycin, Flonase, prednisone, tramadol, Bactrin DS   Myra Gianotti, PA-C Surgical Short Stay/Anesthesiology [facility] Phone [phone] [facility] Phone [phone] 01/02/2021 10:46 AM

## 2021-01-02 NOTE — Progress Notes (Addendum)
I spoke to Mathews Robinsons, Mr. Strollo daughter. Tabatha reports that paient does not complain of chest pain or shortness of breath. Cassandria Santee stated that Mr. Mederos does  not  have  any s/s of Covid in his household.  Patient denies any known exposure to Covid.   Mr. Neilan PCP is Dr. Elsie Stain. See [facility] for decreased renal function. Cardiologist is Dr. Burt Knack. Mr. Hippler has a history of Atrial Fib, Tabatha reports that Dr. Sharol Given told patient to not take Coumadin 01/02/21 or 01/03/21.  Mr. Eckardt has a  history of diabetes, Tabnatha said that patient has never had diabetes. After speaking with Tabatha, I review PCP notes, it is stated that patient has diabetes, last A1C was 7.2 on 07/23/20.  I instructed patient to shower with antibiotic soap, if it is available.  Dry off with a clean towel. Do not put lotion, powder, cologne or deodorant or makeup.No jewelry or piercings. Men may shave their face and neck. Woman should not shave. No nail polish, artificial or acrylic nails. Wear clean clothes, brush your teeth. Glasses, contact lens,dentures or partials may not be worn in the OR. If you need to wear them, please bring a case for glasses, do not wear contacts or bring a case, the hospital does not have contact cases, dentures or partials will have to be removed , make sure they are clean, we will provide a denture cup to put them in. You will need some one to drive you home and a responsible person over the age of 44 to stay with you for the first 24 hours after surgery.

## 2021-01-03 ENCOUNTER — Ambulatory Visit (HOSPITAL_COMMUNITY): Payer: Medicare Other | Admitting: Vascular Surgery

## 2021-01-03 ENCOUNTER — Encounter (HOSPITAL_COMMUNITY): Payer: Self-pay | Admitting: Orthopedic Surgery

## 2021-01-03 ENCOUNTER — Other Ambulatory Visit: Payer: Self-pay

## 2021-01-03 ENCOUNTER — Encounter (HOSPITAL_COMMUNITY)
Admission: RE | Disposition: A | Discharge status: Home / Self Care | Payer: Self-pay | Source: Home / Self Care | Attending: Orthopedic Surgery

## 2021-01-03 ENCOUNTER — Ambulatory Visit (HOSPITAL_COMMUNITY)
Admission: RE | Admit: 2021-01-03 | Discharge: 2021-01-03 | Disposition: A | Discharge status: Home / Self Care | Payer: Medicare Other | Attending: Orthopedic Surgery | Admitting: Orthopedic Surgery

## 2021-01-03 DIAGNOSIS — M868X7 Other osteomyelitis, ankle and foot: Secondary | ICD-10-CM | POA: Insufficient documentation

## 2021-01-03 DIAGNOSIS — Z885 Allergy status to narcotic agent status: Secondary | ICD-10-CM | POA: Insufficient documentation

## 2021-01-03 DIAGNOSIS — M1A0711 Idiopathic chronic gout, right ankle and foot, with tophus (tophi): Secondary | ICD-10-CM | POA: Insufficient documentation

## 2021-01-03 DIAGNOSIS — Z79899 Other long term (current) drug therapy: Secondary | ICD-10-CM | POA: Insufficient documentation

## 2021-01-03 DIAGNOSIS — Z7952 Long term (current) use of systemic steroids: Secondary | ICD-10-CM | POA: Insufficient documentation

## 2021-01-03 DIAGNOSIS — Z880 Allergy status to penicillin: Secondary | ICD-10-CM | POA: Diagnosis not present

## 2021-01-03 DIAGNOSIS — G4733 Obstructive sleep apnea (adult) (pediatric): Secondary | ICD-10-CM | POA: Diagnosis not present

## 2021-01-03 DIAGNOSIS — N183 Chronic kidney disease, stage 3 unspecified: Secondary | ICD-10-CM | POA: Diagnosis not present

## 2021-01-03 DIAGNOSIS — M869 Osteomyelitis, unspecified: Secondary | ICD-10-CM | POA: Diagnosis not present

## 2021-01-03 DIAGNOSIS — E1169 Type 2 diabetes mellitus with other specified complication: Secondary | ICD-10-CM | POA: Diagnosis not present

## 2021-01-03 DIAGNOSIS — K219 Gastro-esophageal reflux disease without esophagitis: Secondary | ICD-10-CM | POA: Insufficient documentation

## 2021-01-03 DIAGNOSIS — Z888 Allergy status to other drugs, medicaments and biological substances status: Secondary | ICD-10-CM | POA: Diagnosis not present

## 2021-01-03 DIAGNOSIS — I482 Chronic atrial fibrillation, unspecified: Secondary | ICD-10-CM | POA: Insufficient documentation

## 2021-01-03 DIAGNOSIS — Z881 Allergy status to other antibiotic agents status: Secondary | ICD-10-CM | POA: Diagnosis not present

## 2021-01-03 DIAGNOSIS — I2510 Atherosclerotic heart disease of native coronary artery without angina pectoris: Secondary | ICD-10-CM | POA: Diagnosis not present

## 2021-01-03 DIAGNOSIS — Z85828 Personal history of other malignant neoplasm of skin: Secondary | ICD-10-CM | POA: Insufficient documentation

## 2021-01-03 DIAGNOSIS — Z89421 Acquired absence of other right toe(s): Secondary | ICD-10-CM | POA: Diagnosis not present

## 2021-01-03 DIAGNOSIS — E1142 Type 2 diabetes mellitus with diabetic polyneuropathy: Secondary | ICD-10-CM | POA: Diagnosis not present

## 2021-01-03 DIAGNOSIS — E785 Hyperlipidemia, unspecified: Secondary | ICD-10-CM | POA: Insufficient documentation

## 2021-01-03 DIAGNOSIS — I080 Rheumatic disorders of both mitral and aortic valves: Secondary | ICD-10-CM | POA: Insufficient documentation

## 2021-01-03 DIAGNOSIS — E1122 Type 2 diabetes mellitus with diabetic chronic kidney disease: Secondary | ICD-10-CM | POA: Insufficient documentation

## 2021-01-03 DIAGNOSIS — Z886 Allergy status to analgesic agent status: Secondary | ICD-10-CM | POA: Diagnosis not present

## 2021-01-03 DIAGNOSIS — Z9861 Coronary angioplasty status: Secondary | ICD-10-CM | POA: Diagnosis not present

## 2021-01-03 DIAGNOSIS — M1A0710 Idiopathic chronic gout, right ankle and foot, without tophus (tophi): Secondary | ICD-10-CM

## 2021-01-03 DIAGNOSIS — I129 Hypertensive chronic kidney disease with stage 1 through stage 4 chronic kidney disease, or unspecified chronic kidney disease: Secondary | ICD-10-CM | POA: Diagnosis not present

## 2021-01-03 DIAGNOSIS — Z87891 Personal history of nicotine dependence: Secondary | ICD-10-CM | POA: Insufficient documentation

## 2021-01-03 HISTORY — DX: Pneumonia, unspecified organism: J18.9

## 2021-01-03 HISTORY — PX: AMPUTATION: SHX166

## 2021-01-03 HISTORY — DX: Unspecified osteoarthritis, unspecified site: M19.90

## 2021-01-03 HISTORY — DX: Nonrheumatic aortic (valve) stenosis: I35.0

## 2021-01-03 LAB — BASIC METABOLIC PANEL
Anion gap: 8 (ref 5–15)
BUN: 32 mg/dL — ABNORMAL HIGH (ref 8–23)
CO2: 21 mmol/L — ABNORMAL LOW (ref 22–32)
Calcium: 9 mg/dL (ref 8.9–10.3)
Chloride: 104 mmol/L (ref 98–111)
Creatinine, Ser: 1.84 mg/dL — ABNORMAL HIGH (ref 0.61–1.24)
GFR, Estimated: 35 mL/min — ABNORMAL LOW (ref >60)
Glucose, Bld: 114 mg/dL — ABNORMAL HIGH (ref 70–99)
Potassium: 4.9 mmol/L (ref 3.5–5.1)
Sodium: 133 mmol/L — ABNORMAL LOW (ref 135–145)

## 2021-01-03 LAB — APTT: aPTT: 38 seconds — ABNORMAL HIGH (ref 24–36)

## 2021-01-03 LAB — CBC
HCT: 44.7 % (ref 39.0–52.0)
Hemoglobin: 14.6 g/dL (ref 13.0–17.0)
MCH: 29.5 pg (ref 26.0–34.0)
MCHC: 32.7 g/dL (ref 30.0–36.0)
MCV: 90.3 fL (ref 80.0–100.0)
Platelets: 211 10*3/uL (ref 150–400)
RBC: 4.95 MIL/uL (ref 4.22–5.81)
RDW: 14.8 % (ref 11.5–15.5)
WBC: 6.7 10*3/uL (ref 4.0–10.5)
nRBC: 0 % (ref 0.0–0.2)

## 2021-01-03 LAB — TYPE AND SCREEN
ABO/RH(D): O POS
Antibody Screen: NEGATIVE

## 2021-01-03 LAB — GLUCOSE, CAPILLARY
Glucose-Capillary: 112 mg/dL — ABNORMAL HIGH (ref 70–99)
Glucose-Capillary: 114 mg/dL — ABNORMAL HIGH (ref 70–99)

## 2021-01-03 LAB — ABO/RH: ABO/RH(D): O POS

## 2021-01-03 LAB — PROTIME-INR
INR: 2.4 — ABNORMAL HIGH (ref 0.8–1.2)
Prothrombin Time: 26 seconds — ABNORMAL HIGH (ref 11.4–15.2)

## 2021-01-03 SURGERY — AMPUTATION DIGIT
Anesthesia: Monitor Anesthesia Care | Site: Leg Lower | Laterality: Right

## 2021-01-03 MED ORDER — CLINDAMYCIN PHOSPHATE 900 MG/50ML IV SOLN
900.0000 mg | INTRAVENOUS | Status: AC
  Administered 2021-01-03: 900 mg via INTRAVENOUS
  Filled 2021-01-03: qty 50

## 2021-01-03 MED ORDER — SODIUM CHLORIDE 0.45 % IV SOLN: INTRAVENOUS | Status: DC

## 2021-01-03 MED ORDER — PROPOFOL 10 MG/ML IV BOLUS
INTRAVENOUS | Status: DC | PRN
  Administered 2021-01-03: 20 mg via INTRAVENOUS
  Administered 2021-01-03: 30 mg via INTRAVENOUS
  Administered 2021-01-03: 20 mg via INTRAVENOUS

## 2021-01-03 MED ORDER — ORAL CARE MOUTH RINSE: 15.0000 mL | Freq: Once | OROMUCOSAL | Status: AC

## 2021-01-03 MED ORDER — FENTANYL CITRATE (PF) 250 MCG/5ML IJ SOLN
INTRAMUSCULAR | Status: AC
  Filled 2021-01-03: qty 5

## 2021-01-03 MED ORDER — PHENYLEPHRINE 40 MCG/ML (10ML) SYRINGE FOR IV PUSH (FOR BLOOD PRESSURE SUPPORT)
PREFILLED_SYRINGE | INTRAVENOUS | Status: AC
  Filled 2021-01-03: qty 10

## 2021-01-03 MED ORDER — TRAMADOL HCL 50 MG PO TABS
50.0000 mg | ORAL_TABLET | Freq: Four times a day (QID) | ORAL | 0 refills | Status: DC | PRN
Start: 2021-01-03 — End: 2021-03-19

## 2021-01-03 MED ORDER — CHLORHEXIDINE GLUCONATE 0.12 % MT SOLN
15.0000 mL | Freq: Once | OROMUCOSAL | Status: AC
  Administered 2021-01-03: 15 mL via OROMUCOSAL
  Filled 2021-01-03: qty 15

## 2021-01-03 MED ORDER — PROPOFOL 10 MG/ML IV BOLUS
INTRAVENOUS | Status: AC
  Filled 2021-01-03: qty 20

## 2021-01-03 MED ORDER — 0.9 % SODIUM CHLORIDE (POUR BTL) OPTIME
TOPICAL | Status: DC | PRN
  Administered 2021-01-03: 1000 mL

## 2021-01-03 MED ORDER — PHENYLEPHRINE 40 MCG/ML (10ML) SYRINGE FOR IV PUSH (FOR BLOOD PRESSURE SUPPORT)
PREFILLED_SYRINGE | INTRAVENOUS | Status: DC | PRN
  Administered 2021-01-03: 80 ug via INTRAVENOUS
  Administered 2021-01-03: 120 ug via INTRAVENOUS

## 2021-01-03 MED ORDER — ONDANSETRON HCL 4 MG/2ML IJ SOLN
INTRAMUSCULAR | Status: AC
  Filled 2021-01-03: qty 2

## 2021-01-03 MED ORDER — LACTATED RINGERS IV SOLN: INTRAVENOUS | Status: DC

## 2021-01-03 SURGICAL SUPPLY — 29 items
BAG COUNTER SPONGE SURGICOUNT (BAG) ×2 IMPLANT
BLADE SURG 21 STRL SS (BLADE) ×2 IMPLANT
BNDG COHESIVE 4X5 TAN ST LF (GAUZE/BANDAGES/DRESSINGS) ×2 IMPLANT
BNDG COHESIVE 4X5 TAN STRL (GAUZE/BANDAGES/DRESSINGS) ×2 IMPLANT
BNDG ESMARK 4X9 LF (GAUZE/BANDAGES/DRESSINGS) IMPLANT
BNDG GAUZE ELAST 4 BULKY (GAUZE/BANDAGES/DRESSINGS) ×2 IMPLANT
COVER SURGICAL LIGHT HANDLE (MISCELLANEOUS) ×4 IMPLANT
DRAPE U-SHAPE 47X51 STRL (DRAPES) ×2 IMPLANT
DRSG ADAPTIC 3X8 NADH LF (GAUZE/BANDAGES/DRESSINGS) ×2 IMPLANT
DRSG PAD ABDOMINAL 8X10 ST (GAUZE/BANDAGES/DRESSINGS) ×2 IMPLANT
DURAPREP 26ML APPLICATOR (WOUND CARE) ×2 IMPLANT
ELECT REM PT RETURN 9FT ADLT (ELECTROSURGICAL) ×2
ELECTRODE REM PT RTRN 9FT ADLT (ELECTROSURGICAL) ×1 IMPLANT
GAUZE SPONGE 4X4 12PLY STRL (GAUZE/BANDAGES/DRESSINGS) IMPLANT
GAUZE SPONGE 4X4 16PLY XRAY LF (GAUZE/BANDAGES/DRESSINGS) ×2 IMPLANT
GLOVE SURG ORTHO LTX SZ9 (GLOVE) ×2 IMPLANT
GLOVE SURG UNDER POLY LF SZ9 (GLOVE) ×2 IMPLANT
GOWN STRL REUS W/ TWL XL LVL3 (GOWN DISPOSABLE) ×2 IMPLANT
GOWN STRL REUS W/TWL XL LVL3 (GOWN DISPOSABLE) ×4
KIT BASIN OR (CUSTOM PROCEDURE TRAY) ×2 IMPLANT
KIT TURNOVER KIT B (KITS) ×2 IMPLANT
MANIFOLD NEPTUNE II (INSTRUMENTS) ×2 IMPLANT
NEEDLE 22X1 1/2 (OR ONLY) (NEEDLE) IMPLANT
NS IRRIG 1000ML POUR BTL (IV SOLUTION) ×2 IMPLANT
PACK ORTHO EXTREMITY (CUSTOM PROCEDURE TRAY) ×2 IMPLANT
PAD ARMBOARD 7.5X6 YLW CONV (MISCELLANEOUS) ×4 IMPLANT
SUT ETHILON 2 0 PSLX (SUTURE) ×2 IMPLANT
SYR CONTROL 10ML LL (SYRINGE) IMPLANT
TOWEL GREEN STERILE (TOWEL DISPOSABLE) ×2 IMPLANT

## 2021-01-03 NOTE — Transfer of Care (Signed)
Immediate Anesthesia Transfer of Care Note  Patient: [name]  Procedure(s) Performed: RIGHT 3RD TOE AMPUTATION (Right: Leg Lower)  Patient Location: PACU  Anesthesia Type:MAC  Level of Consciousness: awake and alert   Airway & Oxygen Therapy: Patient Spontanous Breathing  Post-op Assessment: Report given to RN and Post -op Vital signs reviewed and stable  Post vital signs: Reviewed and stable  Last Vitals:  Vitals Value Taken Time  BP 103/75 01/03/21 1154  Temp    Pulse 72 01/03/21 1157  Resp 18 01/03/21 1157  SpO2 98 % 01/03/21 1157  Vitals shown include unvalidated device data.  Last Pain:  Vitals:   01/03/21 1017  TempSrc:   PainSc: 0-No pain      Patients Stated Pain Goal: 1 (01/29/51 0802)  Complications: No notable events documented.

## 2021-01-03 NOTE — Anesthesia Postprocedure Evaluation (Signed)
Anesthesia Post Note  Patient: [name]  Procedure(s) Performed: RIGHT 3RD TOE AMPUTATION (Right: Leg Lower)     Patient location during evaluation: PACU Anesthesia Type: MAC Pain management: pain level controlled Respiratory status: spontaneous breathing Cardiovascular status: stable Postop Assessment: no apparent nausea or vomiting Anesthetic complications: no   No notable events documented.  Last Vitals:  Vitals:   01/03/21 1155 01/03/21 1210  BP: 103/75 115/74  Pulse: 61 76  Resp: 17 17  Temp: 36.6 C   SpO2: 99% 98%    Last Pain:  Vitals:   01/03/21 1155  TempSrc:   PainSc: 0-No pain                 [place]

## 2021-01-03 NOTE — Op Note (Signed)
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    The risks benefits and alternatives were discussed with the patient preoperatively including but not limited to the risks of infection, bleeding, nerve injury, cardiopulmonary complications, the need for revision surgery, among others, and the patient was willing to proceed.  OPERATIVE IMPLANTS: None  @ENCIMAGES @  OPERATIVE FINDINGS: Good petechial bleeding margins clear  OPERATIVE PROCEDURE: Patient brought the operating room and underwent a regional anesthetic.  After adequate levels anesthesia were obtained patient's right lower extremity was prepped using DuraPrep draped into a sterile field a timeout was called.  The previous incision for the second toe amputation was used a V incision was made to amputate the third toe.  The margins were clear electrocautery was used hemostasis the incision was closed using 2-0 nylon a sterile dressing was applied patient was taken the PACU in stable condition.   DISCHARGE PLANNING:  Antibiotic duration: Preoperative antibiotics  Weightbearing: Weightbearing as tolerated  Pain medication: Prescription for tramadol  Dressing care/ Wound VAC: Dry dressing  Ambulatory devices: Postoperative shoe  Discharge to: Home.  Follow-up: In the office 1 week post operative.

## 2021-01-03 NOTE — Anesthesia Procedure Notes (Signed)
Procedure Name: MAC Date/Time: 01/03/2021 11:40 AM Performed by: Erick Colace, CRNA Pre-anesthesia Checklist: Patient identified, Emergency Drugs available, Suction available, Patient being monitored and Timeout performed Patient Re-evaluated:Patient Re-evaluated prior to induction Oxygen Delivery Method: Nasal cannula Preoxygenation: Pre-oxygenation with 100% oxygen Induction Type: IV induction

## 2021-01-03 NOTE — H&P (Signed)
[name] is an 87 y.o. male.   Chief Complaint: Osteomyelitis ulceration and gout right third toe. HPI: Patient is a 87 year old gentleman with history of gout.  Patient has developed a secondary infection of the right foot third toe secondary to the chronic tophaceous gout.  Patient has failed conservative therapy with antibiotics and wound care and presents at this time for amputation of the third toe.  Past Medical History:  Diagnosis Date   Aortic stenosis    Arthritis    Bradycardia    a. nocturnal with pauses overnight on tele likely due to OSA 10/2015.   Cancer ([place])    skin cancer- burned off   CKD (chronic kidney disease), stage III (HCC)    Coronary artery disease    a. prev nonobst. b. LHC 10/24/15: occlusion of small diffusely diseased OM2 s/p balloon angioplasty, mild nonobstructive disease of mLAD and mRCA, normal LVEDP   Diabetes mellitus without complication ([place])    53/64/68 Pt's daughter states patient does not  have diabetes.  A1C 07/23/20 was 7.2, patientr had been on medication.   Diabetic peripheral neuropathy ([place]) 10/04/2019   Diverticulosis    Esophageal reflux    Esophageal stricture from GERD 06/08/2011   With stricture at GE junction on EGD, dilated 06/2011    Gastric ulcer    on EGD 2013   Gout, unspecified    "on daily RX" (09/09/2017)   History of blood transfusion 02/1964   "when I had right little finger cut off"   Hyperlipidemia    Hypertension    Obesity, unspecified    Orthopnea    OSA on CPAP    Paroxysmal nocturnal dyspnea    Persistent atrial fibrillation ([place])    Pneumonia    Vertigo     Past Surgical History:  Procedure Laterality Date   AMPUTATION Right 03/03/2019   Procedure: RIGHT 2ND TOE AMPUTATION;  Surgeon: [name], MD;  Location: [facility];  Service: Orthopedics;  Laterality: Right;   BALLOON DILATION N/A 10/22/2014   Procedure: BALLOON DILATION;  Surgeon: [name], MD;  [facility];  Service: Endoscopy;   Laterality: N/A;   CARDIAC CATHETERIZATION  07/2008   CARDIAC CATHETERIZATION N/A 10/24/2015   Procedure: Left Heart Cath and Coronary Angiography;  Surgeon: [name], MD;  Location: [facility];  Service: Cardiovascular;  Laterality: N/A;   CARDIAC CATHETERIZATION N/A 10/24/2015   Procedure: Coronary Balloon Angioplasty;  Surgeon: [name], MD;  Location: [facility];  Service: Cardiovascular;  Laterality: N/A;   Carotid Dopplers  09/2009   no sig extracranial stenosis and vertebral arteries had antegrade flow   CATARACT EXTRACTION W/ INTRAOCULAR LENS  IMPLANT, BILATERAL Bilateral    ESOPHAGOGASTRODUODENOSCOPY (EGD) WITH ESOPHAGEAL DILATION     "I've had it stretched 3 times" (10/23/2015)   ESOPHAGOGASTRODUODENOSCOPY (EGD) WITH ESOPHAGEAL DILATION  06/2011   /medical hx above  (09/09/2017)   ESOPHAGOGASTRODUODENOSCOPY (EGD) WITH PROPOFOL N/A 10/22/2014   Procedure: ESOPHAGOGASTRODUODENOSCOPY (EGD) WITH PROPOFOL;  Surgeon: [name], MD;  [facility];  Service: Endoscopy;  Laterality: N/A;   FINGER SURGERY Right 02/1964   "cut little finger off; had it reattached"   I & D EXTREMITY Right 09/10/2017   Procedure: RIGHT FOOT DEBRIDEMENT;  Surgeon: [name], MD;  Location: [facility];  Service: Orthopedics;  Laterality: Right;   MRI of brain  09/2009   chronic microvascular ischemia   OSA Sleep Study     per Dr.  Dohmeier   SHOULDER ARTHROSCOPY W/ ROTATOR CUFF REPAIR Right 2008    Family History  Problem Relation Age of Onset   Stomach cancer Mother        died in 28's    Hypertension Father        died in his 42's pna likely dementia   Dementia Father    Pneumonia Father    Heart disease Brother        S/P CABG   Hypertension Other    Sleep apnea Daughter    Prostate cancer Neg Hx    Colon cancer Neg Hx    Social History:  reports that he quit smoking about 51 years ago. His smoking use included cigarettes. He has a 0.48 pack-year smoking  history. He has quit using smokeless tobacco. He reports that he does not currently use alcohol. He reports that he does not use drugs.  Allergies:  Allergies  Allergen Reactions   Penicillins Shortness Of Breath and Swelling    Patient tolerated Rocephin injection 2019  PATIENT HAS HAD A PCN REACTION WITH IMMEDIATE RASH, FACIAL/TONGUE/THROAT SWELLING, SOB, OR LIGHTHEADEDNESS WITH HYPOTENSION:  #  #  YES  #  Has patient had a PCN reaction causing severe rash involving mucus membranes or skin necrosis: NO Has patient had a PCN reaction that required hospitalization NO Has patient had a PCN reaction occurring within the last 10 years: NO If all of the above answers are "NO", then may proceed with Cephalosporin use.    Atorvastatin Other (See Comments)    No energy, just felt bad     Pravastatin Other (See Comments)    myalgias   Colchicine     Due to renal function- can use prednisone if needed    Hydrocodone Other (See Comments)    "swimmy headed" after use   Nsaids     Due to renal function   Uloric [Febuxostat] Other (See Comments)    Would avoid, worsening gout sx with use.    Vancomycin Other (See Comments)    Red man syndrome- tolerated with slow run and benadryl use   Biaxin [Clarithromycin] Diarrhea   Doxycycline Other (See Comments)    GI upset    No medications prior to admission.    No results found for this or any previous visit (from the past 48 hour(s)). No results found.  Review of Systems  All other systems reviewed and are negative.  There were no vitals taken for this visit. Physical Exam  Patient is alert, oriented, no adenopathy, well-dressed, normal affect, normal respiratory effort. Examination patient has palpable pulses.  He has swelling and dermatitis of the third toe right foot status post amputation of the second toe.  There is tophaceous drainage from the PIP joint without purulence. 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  Plan: We will plan for amputation right foot third toe risks and benefits were discussed including risk of the wound not healing.  Patient states he understands wished to proceed at this time.  Plan for outpatient surgery.  [name], MD 01/03/2021, 6:48 AM

## 2021-01-04 ENCOUNTER — Encounter (HOSPITAL_COMMUNITY): Payer: Self-pay | Admitting: Orthopedic Surgery

## 2021-01-04 NOTE — Progress Notes (Signed)
Office Visit Note   Patient: [name]           Date of Birth: 15-Apr-1930           MRN: [id number] Visit Date: 12/16/2020              Requested by: Tonia Ghent, MD [address] [place],  [place] [address] PCP: Tonia Ghent, MD  Chief Complaint  Patient presents with   Left Leg - Follow-up      HPI: Patient is a 87 year old gentleman who presents in follow-up for left ankle pain and venous insufficiency.  Patient has a history of gout he has been wearing a knee-high compression stocking but not wearing it today patient complains of a indentation at the proximal aspect of the stocking.  Patient is status post a second toe amputation on the right.  Assessment & Plan: Visit Diagnoses:  1. Pain in left ankle and joints of left foot     Plan: We will check his uric acid today patient was given a silicone pad for his toe.  Follow-Up Instructions: Return in about 4 weeks (around 01/13/2021).   Ortho Exam  Patient is alert, oriented, no adenopathy, well-dressed, normal affect, normal respiratory effort. Examination recent ankle-brachial indices showed noncompressible flow.  Patient has swelling over the lateral malleolus.  The sock is fitting well.  There are no ulcers no cellulitis no signs of infection.  Patient has no tenderness to palpation of the anterior joint line primarily painful over the peroneal tendons.  Imaging: No results found. No images are attached to the encounter.  Labs: Lab Results  Component Value Date   HGBA1C 7.2 (H) 07/23/2020   HGBA1C 7.7 (H) 09/11/2019   HGBA1C 6.8 (H) 05/16/2019   ESRSEDRATE 17 (H) 09/09/2017   CRP 1.1 (H) 09/09/2017   LABURIC 7.6 12/16/2020   LABURIC 7.5 07/23/2020   LABURIC 8.1 (H) 11/23/2019   REPTSTATUS 09/19/2017 FINAL 09/14/2017   GRAMSTAIN  09/10/2017    MODERATE WBC PRESENT,BOTH PMN AND MONONUCLEAR NO ORGANISMS SEEN    CULT  09/14/2017    NO GROWTH 5 DAYS Performed at [facility],  [place] [address], [place], [place] [address]      Lab Results  Component Value Date   ALBUMIN 4.0 09/17/2020   ALBUMIN 4.2 07/23/2020   ALBUMIN 4.2 05/16/2019    Lab Results  Component Value Date   MG 2.2 09/09/2017   No results found for: VD25OH  No results found for: PREALBUMIN CBC EXTENDED Latest Ref Rng & Units 01/03/2021 09/17/2020 07/23/2020  WBC 4.0 - 10.5 K/uL 6.7 6.0 8.1  RBC 4.22 - 5.81 MIL/uL 4.95 5.79 5.57  HGB 13.0 - 17.0 g/dL 14.6 17.5(H) 16.2  HCT 39.0 - 52.0 % 44.7 51.3 49.3  PLT 150 - 400 K/uL 211 136(L) 101.0 Repeated and verified X2.(L)  NEUTROABS 1.7 - 7.7 K/uL - 4.1 5.4  LYMPHSABS 0.7 - 4.0 K/uL - 1.3 1.7     There is no height or weight on file to calculate BMI.  Orders:  Orders Placed This Encounter  Procedures   XR Ankle Complete Left   Uric acid   No orders of the defined types were placed in this encounter.    Procedures: No procedures performed  Clinical Data: No additional findings.  ROS:  All other systems negative, except as noted in the HPI. Review of Systems  Objective: Vital Signs: There were no vitals taken for this visit.  Specialty Comments:  No specialty comments available.  PMFS History: Patient Active Problem List   Diagnosis Date Noted   Lower urinary tract symptoms (LUTS) 09/16/2020   Creatinine elevation 07/31/2020   Drug-induced myopathy 07/31/2020   Diabetic polyneuropathy associated with type 2 diabetes mellitus ([place]) 06/20/2020   Gouty arthritis of left foot 06/20/2020   Diabetic peripheral neuropathy ([place]) 10/04/2019   Healthcare maintenance 05/22/2019   B12 deficiency 05/17/2019   Idiopathic chronic gout, unspecified site, without tophus (tophi) 14/43/1540   Acute diastolic heart failure (HCC)    CAD S/P percutaneous coronary angioplasty    Chronic atrial fibrillation (HCC)    CKD (chronic kidney disease), stage III ([place])    Hypertension    Hyperlipidemia    Back pain 05/24/2015   Eustachian tube  dysfunction 05/24/2015   Dysphagia, pharyngoesophageal phase 09/25/2014   History of esophageal stricture 09/25/2014   Chronic anticoagulation 09/25/2014   Esophageal stricture from GERD 06/08/2011   Advance care planning 04/20/2011   OSA on CPAP 09/22/2010   Gout 08/15/2008   OBESITY 08/15/2008   Essential hypertension 08/15/2008   CAD (coronary artery disease), native coronary artery 08/15/2008   GASTROESOPHAGEAL REFLUX DISEASE 08/15/2008   Past Medical History:  Diagnosis Date   Aortic stenosis    Arthritis    Bradycardia    a. nocturnal with pauses overnight on tele likely due to OSA 10/2015.   Cancer ([place])    skin cancer- burned off   CKD (chronic kidney disease), stage III (HCC)    Coronary artery disease    a. prev nonobst. b. LHC 10/24/15: occlusion of small diffusely diseased OM2 s/p balloon angioplasty, mild nonobstructive disease of mLAD and mRCA, normal LVEDP   Diabetes mellitus without complication ([place])    08/67/61 Pt's daughter states patient does not  have diabetes.  A1C 07/23/20 was 7.2, patientr had been on medication.   Diabetic peripheral neuropathy ([place]) 10/04/2019   Diverticulosis    Esophageal reflux    Esophageal stricture from GERD 06/08/2011   With stricture at GE junction on EGD, dilated 06/2011    Gastric ulcer    on EGD 2013   Gout, unspecified    "on daily RX" (09/09/2017)   History of blood transfusion 02/1964   "when I had right little finger cut off"   Hyperlipidemia    Hypertension    Obesity, unspecified    Orthopnea    OSA on CPAP    Paroxysmal nocturnal dyspnea    Persistent atrial fibrillation (HCC)    Pneumonia    Vertigo     Family History  Problem Relation Age of Onset   Stomach cancer Mother        died in 107's    Hypertension Father        died in his 72's pna likely dementia   Dementia Father    Pneumonia Father    Heart disease Brother        S/P CABG   Hypertension Other    Sleep apnea Daughter    Prostate cancer Neg  Hx    Colon cancer Neg Hx     Past Surgical History:  Procedure Laterality Date   AMPUTATION Right 03/03/2019   Procedure: RIGHT 2ND TOE AMPUTATION;  Surgeon: [name], MD;  Location: [facility];  Service: Orthopedics;  Laterality: Right;   AMPUTATION Right 01/03/2021   Procedure: RIGHT 3RD TOE AMPUTATION;  Surgeon: [name], MD;  Location: [facility];  Service: Orthopedics;  Laterality: Right;  BALLOON DILATION N/A 10/22/2014   Procedure: BALLOON DILATION;  Surgeon: [name], MD;  [facility];  Service: Endoscopy;  Laterality: N/A;   CARDIAC CATHETERIZATION  07/2008   CARDIAC CATHETERIZATION N/A 10/24/2015   Procedure: Left Heart Cath and Coronary Angiography;  Surgeon: [name], MD;  Location: [facility];  Service: Cardiovascular;  Laterality: N/A;   CARDIAC CATHETERIZATION N/A 10/24/2015   Procedure: Coronary Balloon Angioplasty;  Surgeon: [name], MD;  Location: [facility];  Service: Cardiovascular;  Laterality: N/A;   Carotid Dopplers  09/2009   no sig extracranial stenosis and vertebral arteries had antegrade flow   CATARACT EXTRACTION W/ INTRAOCULAR LENS  IMPLANT, BILATERAL Bilateral    ESOPHAGOGASTRODUODENOSCOPY (EGD) WITH ESOPHAGEAL DILATION     "I've had it stretched 3 times" (10/23/2015)   ESOPHAGOGASTRODUODENOSCOPY (EGD) WITH ESOPHAGEAL DILATION  06/2011   /medical hx above  (09/09/2017)   ESOPHAGOGASTRODUODENOSCOPY (EGD) WITH PROPOFOL N/A 10/22/2014   Procedure: ESOPHAGOGASTRODUODENOSCOPY (EGD) WITH PROPOFOL;  Surgeon: [name], MD;  [facility];  Service: Endoscopy;  Laterality: N/A;   FINGER SURGERY Right 02/1964   "cut little finger off; had it reattached"   I & D EXTREMITY Right 09/10/2017   Procedure: RIGHT FOOT DEBRIDEMENT;  Surgeon: [name], MD;  Location: [facility];  Service: Orthopedics;  Laterality: Right;   MRI of brain  09/2009   chronic microvascular ischemia   OSA Sleep Study     per Dr. 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

## 2021-01-08 ENCOUNTER — Encounter: Payer: Self-pay | Admitting: Orthopedic Surgery

## 2021-01-08 ENCOUNTER — Ambulatory Visit: Payer: Medicare Other

## 2021-01-13 ENCOUNTER — Other Ambulatory Visit: Payer: Self-pay

## 2021-01-13 ENCOUNTER — Ambulatory Visit (INDEPENDENT_AMBULATORY_CARE_PROVIDER_SITE_OTHER): Payer: Medicare Other | Admitting: Orthopedic Surgery

## 2021-01-13 DIAGNOSIS — S98131A Complete traumatic amputation of one right lesser toe, initial encounter: Secondary | ICD-10-CM

## 2021-01-14 ENCOUNTER — Encounter: Payer: Self-pay | Admitting: Orthopedic Surgery

## 2021-01-14 NOTE — Progress Notes (Signed)
Office Visit Note   Patient: [name]           Date of Birth: Sep 14, 1930           MRN: [id number] Visit Date: 01/13/2021              Requested by: Tonia Ghent, MD [address] [place],  [place] [address] PCP: Tonia Ghent, MD  Chief Complaint  Patient presents with   Right Foot - Routine Post Op    Right third toe amputation @ MTP joint 01/03/21      HPI: Patient is an 87 year old gentleman who presents in follow-up status post right third toe amputation.  Patient denies any complaints.  Patient is 1 week out from surgery.  Assessment & Plan: Visit Diagnoses:  1. Amputated toe, right ([place])     Plan: Continue with protected weightbearing follow-up in 1 week to remove the sutures.  Follow-Up Instructions: Return in about 1 week (around 01/20/2021).   Ortho Exam  Patient is alert, oriented, no adenopathy, well-dressed, normal affect, normal respiratory effort. Examination the wound is well approximated healing well.  Patient will wash with soap and water and use a Band-Aid follow-up in 1 week to harvest the sutures.  Imaging: No results found. No images are attached to the encounter.  Labs: Lab Results  Component Value Date   HGBA1C 7.2 (H) 07/23/2020   HGBA1C 7.7 (H) 09/11/2019   HGBA1C 6.8 (H) 05/16/2019   ESRSEDRATE 17 (H) 09/09/2017   CRP 1.1 (H) 09/09/2017   LABURIC 7.6 12/16/2020   LABURIC 7.5 07/23/2020   LABURIC 8.1 (H) 11/23/2019   REPTSTATUS 09/19/2017 FINAL 09/14/2017   GRAMSTAIN  09/10/2017    MODERATE WBC PRESENT,BOTH PMN AND MONONUCLEAR NO ORGANISMS SEEN    CULT  09/14/2017    NO GROWTH 5 DAYS Performed at [facility], [place] [address], [place], [place] [address]      Lab Results  Component Value Date   ALBUMIN 4.0 09/17/2020   ALBUMIN 4.2 07/23/2020   ALBUMIN 4.2 05/16/2019    Lab Results  Component Value Date   MG 2.2 09/09/2017   No results found for: VD25OH  No results found for: PREALBUMIN CBC  EXTENDED Latest Ref Rng & Units 01/03/2021 09/17/2020 07/23/2020  WBC 4.0 - 10.5 K/uL 6.7 6.0 8.1  RBC 4.22 - 5.81 MIL/uL 4.95 5.79 5.57  HGB 13.0 - 17.0 g/dL 14.6 17.5(H) 16.2  HCT 39.0 - 52.0 % 44.7 51.3 49.3  PLT 150 - 400 K/uL 211 136(L) 101.0 Repeated and verified X2.(L)  NEUTROABS 1.7 - 7.7 K/uL - 4.1 5.4  LYMPHSABS 0.7 - 4.0 K/uL - 1.3 1.7     There is no height or weight on file to calculate BMI.  Orders:  No orders of the defined types were placed in this encounter.  No orders of the defined types were placed in this encounter.    Procedures: No procedures performed  Clinical Data: No additional findings.  ROS:  All other systems negative, except as noted in the HPI. Review of Systems  Objective: Vital Signs: There were no vitals taken for this visit.  Specialty Comments:  No specialty comments available.  PMFS History: Patient Active Problem List   Diagnosis Date Noted   Lower urinary tract symptoms (LUTS) 09/16/2020   Creatinine elevation 07/31/2020   Drug-induced myopathy 07/31/2020   Diabetic polyneuropathy associated with type 2 diabetes mellitus ([place]) 06/20/2020   Gouty arthritis of left  foot 06/20/2020   Diabetic peripheral neuropathy ([place]) 10/04/2019   Healthcare maintenance 05/22/2019   B12 deficiency 05/17/2019   Idiopathic chronic gout, unspecified site, without tophus (tophi) 20/23/3435   Acute diastolic heart failure (HCC)    CAD S/P percutaneous coronary angioplasty    Chronic atrial fibrillation ([place])    CKD (chronic kidney disease), stage III ([place])    Hypertension    Hyperlipidemia    Back pain 05/24/2015   Eustachian tube dysfunction 05/24/2015   Dysphagia, pharyngoesophageal phase 09/25/2014   History of esophageal stricture 09/25/2014   Chronic anticoagulation 09/25/2014   Esophageal stricture from GERD 06/08/2011   Advance care planning 04/20/2011   OSA on CPAP 09/22/2010   Gout 08/15/2008   OBESITY 08/15/2008   Essential  hypertension 08/15/2008   CAD (coronary artery disease), native coronary artery 08/15/2008   GASTROESOPHAGEAL REFLUX DISEASE 08/15/2008   Past Medical History:  Diagnosis Date   Aortic stenosis    Arthritis    Bradycardia    a. nocturnal with pauses overnight on tele likely due to OSA 10/2015.   Cancer ([place])    skin cancer- burned off   CKD (chronic kidney disease), stage III (HCC)    Coronary artery disease    a. prev nonobst. b. LHC 10/24/15: occlusion of small diffusely diseased OM2 s/p balloon angioplasty, mild nonobstructive disease of mLAD and mRCA, normal LVEDP   Diabetes mellitus without complication ([place])    68/61/68 Pt's daughter states patient does not  have diabetes.  A1C 07/23/20 was 7.2, patientr had been on medication.   Diabetic peripheral neuropathy ([place]) 10/04/2019   Diverticulosis    Esophageal reflux    Esophageal stricture from GERD 06/08/2011   With stricture at GE junction on EGD, dilated 06/2011    Gastric ulcer    on EGD 2013   Gout, unspecified    "on daily RX" (09/09/2017)   History of blood transfusion 02/1964   "when I had right little finger cut off"   Hyperlipidemia    Hypertension    Obesity, unspecified    Orthopnea    OSA on CPAP    Paroxysmal nocturnal dyspnea    Persistent atrial fibrillation (HCC)    Pneumonia    Vertigo     Family History  Problem Relation Age of Onset   Stomach cancer Mother        died in 73's    Hypertension Father        died in his 46's pna likely dementia   Dementia Father    Pneumonia Father    Heart disease Brother        S/P CABG   Hypertension Other    Sleep apnea Daughter    Prostate cancer Neg Hx    Colon cancer Neg Hx     Past Surgical History:  Procedure Laterality Date   AMPUTATION Right 03/03/2019   Procedure: RIGHT 2ND TOE AMPUTATION;  Surgeon: [name], MD;  Location: [facility];  Service: Orthopedics;  Laterality: Right;   AMPUTATION Right 01/03/2021   Procedure: RIGHT 3RD TOE AMPUTATION;   Surgeon: [name], MD;  Location: [facility];  Service: Orthopedics;  Laterality: Right;   BALLOON DILATION N/A 10/22/2014   Procedure: BALLOON DILATION;  Surgeon: [name], MD;  [facility];  Service: Endoscopy;  Laterality: N/A;   CARDIAC CATHETERIZATION  07/2008   CARDIAC CATHETERIZATION N/A 10/24/2015   Procedure: Left Heart Cath and Coronary Angiography;  Surgeon: [name], MD;  Location:  [facility];  Service: Cardiovascular;  Laterality: N/A;   CARDIAC CATHETERIZATION N/A 10/24/2015   Procedure: Coronary Balloon Angioplasty;  Surgeon: [name], MD;  Location: [facility];  Service: Cardiovascular;  Laterality: N/A;   Carotid Dopplers  09/2009   no sig extracranial stenosis and vertebral arteries had antegrade flow   CATARACT EXTRACTION W/ INTRAOCULAR LENS  IMPLANT, BILATERAL Bilateral    ESOPHAGOGASTRODUODENOSCOPY (EGD) WITH ESOPHAGEAL DILATION     "I've had it stretched 3 times" (10/23/2015)   ESOPHAGOGASTRODUODENOSCOPY (EGD) WITH ESOPHAGEAL DILATION  06/2011   /medical hx above  (09/09/2017)   ESOPHAGOGASTRODUODENOSCOPY (EGD) WITH PROPOFOL N/A 10/22/2014   Procedure: ESOPHAGOGASTRODUODENOSCOPY (EGD) WITH PROPOFOL;  Surgeon: [name], MD;  [facility];  Service: Endoscopy;  Laterality: N/A;   FINGER SURGERY Right 02/1964   "cut little finger off; had it reattached"   I & D EXTREMITY Right 09/10/2017   Procedure: RIGHT FOOT DEBRIDEMENT;  Surgeon: [name], MD;  Location: [facility];  Service: Orthopedics;  Laterality: Right;   MRI of brain  09/2009   chronic microvascular ischemia   OSA Sleep Study     per Dr. 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

## 2021-01-17 ENCOUNTER — Ambulatory Visit (HOSPITAL_BASED_OUTPATIENT_CLINIC_OR_DEPARTMENT_OTHER): Payer: Medicare Other | Admitting: Orthopaedic Surgery

## 2021-01-18 DIAGNOSIS — Z20828 Contact with and (suspected) exposure to other viral communicable diseases: Secondary | ICD-10-CM | POA: Diagnosis not present

## 2021-01-20 ENCOUNTER — Encounter: Payer: Medicare Other | Admitting: Orthopedic Surgery

## 2021-01-21 ENCOUNTER — Other Ambulatory Visit: Payer: Self-pay

## 2021-01-21 ENCOUNTER — Ambulatory Visit (INDEPENDENT_AMBULATORY_CARE_PROVIDER_SITE_OTHER): Payer: Medicare Other | Admitting: Orthopedic Surgery

## 2021-01-21 DIAGNOSIS — S98131A Complete traumatic amputation of one right lesser toe, initial encounter: Secondary | ICD-10-CM

## 2021-01-21 DIAGNOSIS — Z89421 Acquired absence of other right toe(s): Secondary | ICD-10-CM

## 2021-01-21 DIAGNOSIS — M1A0710 Idiopathic chronic gout, right ankle and foot, without tophus (tophi): Secondary | ICD-10-CM

## 2021-01-22 ENCOUNTER — Encounter: Payer: Self-pay | Admitting: Orthopedic Surgery

## 2021-01-22 NOTE — Progress Notes (Signed)
Office Visit Note   Patient: [name]           Date of Birth: [birth year]           MRN: [id number] Visit Date: 01/21/2021              Requested by: Tonia Ghent, MD [address] [place],  [place] [address] PCP: Tonia Ghent, MD  Chief Complaint  Patient presents with   Right Foot - Routine Post Op    01/03/21 right 3rd toe amputation at MTP joint    Left Ankle - Pain   Right Elbow - Pain      HPI: Patient is an 87 year old gentleman who presents in follow-up status post right third toe amputation approximately 3 weeks ago.  Patient also complains of flareup of gout involving the olecranon bursa right elbow with pain and swelling and redness.  As well was gout of the lateral left ankle.  Patient does have renal failure with a GFR of 35 and is currently on Lasix 20 mg a day both contribute to the flareup of gout.  He is currently taking allopurinol 100 mg a day and just started taking colchicine 0.6 mg twice a day.  His last uric acid was 7.6.  Assessment & Plan: Visit Diagnoses:  1. Amputated toe, right ([place])   2. Idiopathic chronic gout of right foot without tophus     Plan: Recommend patient discuss with his nephrologist about taking the allopurinol and colchicine with his renal disease.  Recommended compression stockings for both lower extremities reevaluate in 3 weeks.  Follow-Up Instructions: Return in about 3 weeks (around 02/11/2021).   Ortho Exam  Patient is alert, oriented, no adenopathy, well-dressed, normal affect, normal respiratory effort. Examination the third toe amputation is well-healed sutures harvested there is no redness cellulitis or drainage there is swelling of both lower extremities.  Recommended compression to decrease the swelling in the lower extremities as this should have a better effect than the Lasix.  Imaging: No results found. No images are attached to the encounter.  Labs: Lab Results  Component Value Date    HGBA1C 7.2 (H) 07/23/2020   HGBA1C 7.7 (H) 09/11/2019   HGBA1C 6.8 (H) 05/16/2019   ESRSEDRATE 17 (H) 09/09/2017   CRP 1.1 (H) 09/09/2017   LABURIC 7.6 12/16/2020   LABURIC 7.5 07/23/2020   LABURIC 8.1 (H) 11/23/2019   REPTSTATUS 09/19/2017 FINAL 09/14/2017   GRAMSTAIN  09/10/2017    MODERATE WBC PRESENT,BOTH PMN AND MONONUCLEAR NO ORGANISMS SEEN    CULT  09/14/2017    NO GROWTH 5 DAYS Performed at [facility], [place] [address] [place], [place] [address]      Lab Results  Component Value Date   ALBUMIN 4.0 09/17/2020   ALBUMIN 4.2 07/23/2020   ALBUMIN 4.2 05/16/2019    Lab Results  Component Value Date   MG 2.2 09/09/2017   No results found for: VD25OH  No results found for: PREALBUMIN CBC EXTENDED Latest Ref Rng & Units 01/03/2021 09/17/2020 07/23/2020  WBC 4.0 - 10.5 K/uL 6.7 6.0 8.1  RBC 4.22 - 5.81 MIL/uL 4.95 5.79 5.57  HGB 13.0 - 17.0 g/dL 14.6 17.5(H) 16.2  HCT 39.0 - 52.0 % 44.7 51.3 49.3  PLT 150 - 400 K/uL 211 136(L) 101.0 Repeated and verified X2.(L)  NEUTROABS 1.7 - 7.7 K/uL - 4.1 5.4  LYMPHSABS 0.7 - 4.0 K/uL - 1.3 1.7     There is no  height or weight on file to calculate BMI.  Orders:  No orders of the defined types were placed in this encounter.  No orders of the defined types were placed in this encounter.    Procedures: No procedures performed  Clinical Data: No additional findings.  ROS:  All other systems negative, except as noted in the HPI. Review of Systems  Objective: Vital Signs: There were no vitals taken for this visit.  Specialty Comments:  No specialty comments available.  PMFS History: Patient Active Problem List   Diagnosis Date Noted   Lower urinary tract symptoms (LUTS) 09/16/2020   Creatinine elevation 07/31/2020   Drug-induced myopathy 07/31/2020   Diabetic polyneuropathy associated with type 2 diabetes mellitus ([place]) 06/20/2020   Gouty arthritis of left foot 06/20/2020   Diabetic peripheral neuropathy  ([place]) 10/04/2019   Healthcare maintenance 05/22/2019   B12 deficiency 05/17/2019   Idiopathic chronic gout, unspecified site, without tophus (tophi) 42/35/3614   Acute diastolic heart failure (HCC)    CAD S/P percutaneous coronary angioplasty    Chronic atrial fibrillation (HCC)    CKD (chronic kidney disease), stage III ([place])    Hypertension    Hyperlipidemia    Back pain 05/24/2015   Eustachian tube dysfunction 05/24/2015   Dysphagia, pharyngoesophageal phase 09/25/2014   History of esophageal stricture 09/25/2014   Chronic anticoagulation 09/25/2014   Esophageal stricture from GERD 06/08/2011   Advance care planning 04/20/2011   OSA on CPAP 09/22/2010   Gout 08/15/2008   OBESITY 08/15/2008   Essential hypertension 08/15/2008   CAD (coronary artery disease), native coronary artery 08/15/2008   GASTROESOPHAGEAL REFLUX DISEASE 08/15/2008   Past Medical History:  Diagnosis Date   Aortic stenosis    Arthritis    Bradycardia    a. nocturnal with pauses overnight on tele likely due to OSA 10/2015.   Cancer ([place])    skin cancer- burned off   CKD (chronic kidney disease), stage III (HCC)    Coronary artery disease    a. prev nonobst. b. LHC 10/24/15: occlusion of small diffusely diseased OM2 s/p balloon angioplasty, mild nonobstructive disease of mLAD and mRCA, normal LVEDP   Diabetes mellitus without complication ([place])    43/15/40 Pt's daughter states patient does not  have diabetes.  A1C 07/23/20 was 7.2, patientr had been on medication.   Diabetic peripheral neuropathy ([place]) 10/04/2019   Diverticulosis    Esophageal reflux    Esophageal stricture from GERD 06/08/2011   With stricture at GE junction on EGD, dilated 06/2011    Gastric ulcer    on EGD 2013   Gout, unspecified    "on daily RX" (09/09/2017)   History of blood transfusion 02/1964   "when I had right little finger cut off"   Hyperlipidemia    Hypertension    Obesity, unspecified    Orthopnea    OSA on CPAP     Paroxysmal nocturnal dyspnea    Persistent atrial fibrillation (HCC)    Pneumonia    Vertigo     Family History  Problem Relation Age of Onset   Stomach cancer Mother        died in 22's    Hypertension Father        died in his 41's pna likely dementia   Dementia Father    Pneumonia Father    Heart disease Brother        S/P CABG   Hypertension Other    Sleep apnea Daughter    Prostate cancer  Neg Hx    Colon cancer Neg Hx     Past Surgical History:  Procedure Laterality Date   AMPUTATION Right 03/03/2019   Procedure: RIGHT 2ND TOE AMPUTATION;  Surgeon: [name], MD;  Location: [facility];  Service: Orthopedics;  Laterality: Right;   AMPUTATION Right 01/03/2021   Procedure: RIGHT 3RD TOE AMPUTATION;  Surgeon: [name], MD;  Location: [facility];  Service: Orthopedics;  Laterality: Right;   BALLOON DILATION N/A 10/22/2014   Procedure: BALLOON DILATION;  Surgeon: [name], MD;  [facility]: [facility];  Service: Endoscopy;  Laterality: N/A;   CARDIAC CATHETERIZATION  07/2008   CARDIAC CATHETERIZATION N/A 10/24/2015   Procedure: Left Heart Cath and Coronary Angiography;  Surgeon: [name], MD;  Location: [facility];  Service: Cardiovascular;  Laterality: N/A;   CARDIAC CATHETERIZATION N/A 10/24/2015   Procedure: Coronary Balloon Angioplasty;  Surgeon: [name], MD;  Location: [facility];  Service: Cardiovascular;  Laterality: N/A;   Carotid Dopplers  09/2009   no sig extracranial stenosis and vertebral arteries had antegrade flow   CATARACT EXTRACTION W/ INTRAOCULAR LENS  IMPLANT, BILATERAL Bilateral    ESOPHAGOGASTRODUODENOSCOPY (EGD) WITH ESOPHAGEAL DILATION     "I've had it stretched 3 times" (10/23/2015)   ESOPHAGOGASTRODUODENOSCOPY (EGD) WITH ESOPHAGEAL DILATION  06/2011   /medical hx above  (09/09/2017)   ESOPHAGOGASTRODUODENOSCOPY (EGD) WITH PROPOFOL N/A 10/22/2014   Procedure: ESOPHAGOGASTRODUODENOSCOPY (EGD) WITH PROPOFOL;  Surgeon: [name], MD;  [facility];  Service: Endoscopy;  Laterality: N/A;   FINGER SURGERY Right 02/1964   "cut little finger off; had it reattached"   I & D EXTREMITY Right 09/10/2017   Procedure: RIGHT FOOT DEBRIDEMENT;  Surgeon: [name], MD;  Location: [facility];  Service: Orthopedics;  Laterality: Right;   MRI of brain  09/2009   chronic microvascular ischemia   OSA Sleep Study     per Dr. 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

## 2021-01-24 ENCOUNTER — Ambulatory Visit (INDEPENDENT_AMBULATORY_CARE_PROVIDER_SITE_OTHER): Payer: Medicare Other

## 2021-01-24 ENCOUNTER — Other Ambulatory Visit: Payer: Self-pay

## 2021-01-24 DIAGNOSIS — I4819 Other persistent atrial fibrillation: Secondary | ICD-10-CM | POA: Diagnosis not present

## 2021-01-24 DIAGNOSIS — Z5181 Encounter for therapeutic drug level monitoring: Secondary | ICD-10-CM | POA: Diagnosis not present

## 2021-01-24 LAB — POCT INR: INR: 1.8 — AB (ref 2.0–3.0)

## 2021-01-24 NOTE — Patient Instructions (Signed)
Description   Take extra 0.5 tablet today and then continue taking Warfarin 1 tablet daily except 1.5 tablets on Sundays and Thursdays. Recheck INR in 5 weeks. Call Coumadin clinic for any changes in medications or up coming procedures. [phone].

## 2021-01-30 ENCOUNTER — Other Ambulatory Visit: Payer: Self-pay

## 2021-01-30 ENCOUNTER — Ambulatory Visit (INDEPENDENT_AMBULATORY_CARE_PROVIDER_SITE_OTHER): Payer: Medicare Other

## 2021-01-30 DIAGNOSIS — E538 Deficiency of other specified B group vitamins: Secondary | ICD-10-CM | POA: Diagnosis not present

## 2021-01-30 MED ORDER — CYANOCOBALAMIN 1000 MCG/ML IJ SOLN
1000.0000 ug | Freq: Once | INTRAMUSCULAR | Status: AC
  Administered 2021-01-30: 12:00:00 1000 ug via INTRAMUSCULAR

## 2021-01-30 NOTE — Progress Notes (Signed)
Per orders of Dr. Damita Dunnings, injection of monthly B12 1000 mcg/ml injection given by Pilar Grammes, CMA in Left Deltoid. Patient tolerated injection well.

## 2021-01-31 DIAGNOSIS — M25521 Pain in right elbow: Secondary | ICD-10-CM | POA: Diagnosis not present

## 2021-01-31 DIAGNOSIS — M7021 Olecranon bursitis, right elbow: Secondary | ICD-10-CM | POA: Diagnosis not present

## 2021-02-04 DIAGNOSIS — M7021 Olecranon bursitis, right elbow: Secondary | ICD-10-CM | POA: Diagnosis not present

## 2021-02-07 DIAGNOSIS — U071 COVID-19: Secondary | ICD-10-CM | POA: Diagnosis not present

## 2021-02-11 ENCOUNTER — Encounter: Payer: Self-pay | Admitting: Orthopedic Surgery

## 2021-02-11 ENCOUNTER — Ambulatory Visit (INDEPENDENT_AMBULATORY_CARE_PROVIDER_SITE_OTHER): Payer: Medicare Other | Admitting: Orthopedic Surgery

## 2021-02-11 DIAGNOSIS — M7021 Olecranon bursitis, right elbow: Secondary | ICD-10-CM | POA: Diagnosis not present

## 2021-02-11 DIAGNOSIS — Z89421 Acquired absence of other right toe(s): Secondary | ICD-10-CM

## 2021-02-11 DIAGNOSIS — M6701 Short Achilles tendon (acquired), right ankle: Secondary | ICD-10-CM

## 2021-02-11 DIAGNOSIS — S98131A Complete traumatic amputation of one right lesser toe, initial encounter: Secondary | ICD-10-CM

## 2021-02-11 NOTE — Progress Notes (Signed)
Office Visit Note   Patient: [name]           Date of Birth: 1930/09/06           MRN: [id number] Visit Date: 02/11/2021              Requested by: Tonia Ghent, MD [name] [place],  [place] [address] PCP: Tonia Ghent, MD  Chief Complaint  Patient presents with   Right Foot - Routine Post Op    01/03/21 right foot 3rd toe amputation       HPI: Patient is an 87 year old gentleman who presents status post third toe amputation right foot also status post second toe amputation of the right foot as well.  Patient has had recurrent episodes of flareup of gout most recently had a flareup of the olecranon bursa on the right.  He was seen at [facility] urgent care and followed up today and has a follow-up with them in 2 weeks.  This underwent debridement.  Patient complains of pain across the ball of the right foot.  Assessment & Plan: Visit Diagnoses:  1. Amputated toe, right ([place])   2. Achilles tendon contracture, right     Plan: No restrictions with the right foot.  Patient was given instructions and demonstrated Achilles stretching to do 5 times a day a minute at a time.  Follow-Up Instructions: Return in about 2 months (around 04/13/2021).   Ortho Exam  Patient is alert, oriented, no adenopathy, well-dressed, normal affect, normal respiratory effort. Examination there are no ulcers or calluses on the right foot the surgical incision is well-healed there is no signs of infection.  With the knee extended patient has dorsiflexion just short of neutral with Achilles contracture.  Patient was demonstrated Achilles stretching to do 5 times a day a minute at a time.  Imaging: No results found. No images are attached to the encounter.  Labs: Lab Results  Component Value Date   HGBA1C 7.2 (H) 07/23/2020   HGBA1C 7.7 (H) 09/11/2019   HGBA1C 6.8 (H) 05/16/2019   ESRSEDRATE 17 (H) 09/09/2017   CRP 1.1 (H) 09/09/2017   LABURIC 7.6 12/16/2020    LABURIC 7.5 07/23/2020   LABURIC 8.1 (H) 11/23/2019   REPTSTATUS 09/19/2017 FINAL 09/14/2017   GRAMSTAIN  09/10/2017    MODERATE WBC PRESENT,BOTH PMN AND MONONUCLEAR NO ORGANISMS SEEN    CULT  09/14/2017    NO GROWTH 5 DAYS Performed at [facility], [place] [address], [place], [place] [address]      Lab Results  Component Value Date   ALBUMIN 4.0 09/17/2020   ALBUMIN 4.2 07/23/2020   ALBUMIN 4.2 05/16/2019    Lab Results  Component Value Date   MG 2.2 09/09/2017   No results found for: VD25OH  No results found for: PREALBUMIN CBC EXTENDED Latest Ref Rng & Units 01/03/2021 09/17/2020 07/23/2020  WBC 4.0 - 10.5 K/uL 6.7 6.0 8.1  RBC 4.22 - 5.81 MIL/uL 4.95 5.79 5.57  HGB 13.0 - 17.0 g/dL 14.6 17.5(H) 16.2  HCT 39.0 - 52.0 % 44.7 51.3 49.3  PLT 150 - 400 K/uL 211 136(L) 101.0 Repeated and verified X2.(L)  NEUTROABS 1.7 - 7.7 K/uL - 4.1 5.4  LYMPHSABS 0.7 - 4.0 K/uL - 1.3 1.7     There is no height or weight on file to calculate BMI.  Orders:  No orders of the defined types were placed in this encounter.  No orders of the  defined types were placed in this encounter.    Procedures: No procedures performed  Clinical Data: No additional findings.  ROS:  All other systems negative, except as noted in the HPI. Review of Systems  Objective: Vital Signs: There were no vitals taken for this visit.  Specialty Comments:  No specialty comments available.  PMFS History: Patient Active Problem List   Diagnosis Date Noted   Lower urinary tract symptoms (LUTS) 09/16/2020   Creatinine elevation 07/31/2020   Drug-induced myopathy 07/31/2020   Diabetic polyneuropathy associated with type 2 diabetes mellitus ([place]) 06/20/2020   Gouty arthritis of left foot 06/20/2020   Diabetic peripheral neuropathy ([place]) 10/04/2019   Healthcare maintenance 05/22/2019   B12 deficiency 05/17/2019   Idiopathic chronic gout, unspecified site, without tophus (tophi) 08/65/7846   Acute  diastolic heart failure (HCC)    CAD S/P percutaneous coronary angioplasty    Chronic atrial fibrillation (HCC)    CKD (chronic kidney disease), stage III ([place])    Hypertension    Hyperlipidemia    Back pain 05/24/2015   Eustachian tube dysfunction 05/24/2015   Dysphagia, pharyngoesophageal phase 09/25/2014   History of esophageal stricture 09/25/2014   Chronic anticoagulation 09/25/2014   Esophageal stricture from GERD 06/08/2011   Advance care planning 04/20/2011   OSA on CPAP 09/22/2010   Gout 08/15/2008   OBESITY 08/15/2008   Essential hypertension 08/15/2008   CAD (coronary artery disease), native coronary artery 08/15/2008   GASTROESOPHAGEAL REFLUX DISEASE 08/15/2008   Past Medical History:  Diagnosis Date   Aortic stenosis    Arthritis    Bradycardia    a. nocturnal with pauses overnight on tele likely due to OSA 10/2015.   Cancer ([place])    skin cancer- burned off   CKD (chronic kidney disease), stage III (HCC)    Coronary artery disease    a. prev nonobst. b. LHC 10/24/15: occlusion of small diffusely diseased OM2 s/p balloon angioplasty, mild nonobstructive disease of mLAD and mRCA, normal LVEDP   Diabetes mellitus without complication ([place])    96/29/52 Pt's daughter states patient does not  have diabetes.  A1C 07/23/20 was 7.2, patientr had been on medication.   Diabetic peripheral neuropathy ([place]) 10/04/2019   Diverticulosis    Esophageal reflux    Esophageal stricture from GERD 06/08/2011   With stricture at GE junction on EGD, dilated 06/2011    Gastric ulcer    on EGD 2013   Gout, unspecified    "on daily RX" (09/09/2017)   History of blood transfusion 02/1964   "when I had right little finger cut off"   Hyperlipidemia    Hypertension    Obesity, unspecified    Orthopnea    OSA on CPAP    Paroxysmal nocturnal dyspnea    Persistent atrial fibrillation (HCC)    Pneumonia    Vertigo     Family History  Problem Relation Age of Onset   Stomach cancer Mother         died in 78's    Hypertension Father        died in his 8's pna likely dementia   Dementia Father    Pneumonia Father    Heart disease Brother        S/P CABG   Hypertension Other    Sleep apnea Daughter    Prostate cancer Neg Hx    Colon cancer Neg Hx     Past Surgical History:  Procedure Laterality Date   AMPUTATION Right 03/03/2019  Procedure: RIGHT 2ND TOE AMPUTATION;  Surgeon: [name], MD;  Location: [facility];  Service: Orthopedics;  Laterality: Right;   AMPUTATION Right 01/03/2021   Procedure: RIGHT 3RD TOE AMPUTATION;  Surgeon: [name], MD;  Location: [facility];  Service: Orthopedics;  Laterality: Right;   BALLOON DILATION N/A 10/22/2014   Procedure: BALLOON DILATION;  Surgeon: [name], MD;  [facility];  Service: Endoscopy;  Laterality: N/A;   CARDIAC CATHETERIZATION  07/2008   CARDIAC CATHETERIZATION N/A 10/24/2015   Procedure: Left Heart Cath and Coronary Angiography;  Surgeon: [name], MD;  Location: [facility];  Service: Cardiovascular;  Laterality: N/A;   CARDIAC CATHETERIZATION N/A 10/24/2015   Procedure: Coronary Balloon Angioplasty;  Surgeon: [name], MD;  Location: [facility];  Service: Cardiovascular;  Laterality: N/A;   Carotid Dopplers  09/2009   no sig extracranial stenosis and vertebral arteries had antegrade flow   CATARACT EXTRACTION W/ INTRAOCULAR LENS  IMPLANT, BILATERAL Bilateral    ESOPHAGOGASTRODUODENOSCOPY (EGD) WITH ESOPHAGEAL DILATION     "I've had it stretched 3 times" (10/23/2015)   ESOPHAGOGASTRODUODENOSCOPY (EGD) WITH ESOPHAGEAL DILATION  06/2011   /medical hx above  (09/09/2017)   ESOPHAGOGASTRODUODENOSCOPY (EGD) WITH PROPOFOL N/A 10/22/2014   Procedure: ESOPHAGOGASTRODUODENOSCOPY (EGD) WITH PROPOFOL;  Surgeon: [name], MD;  [facility];  Service: Endoscopy;  Laterality: N/A;   FINGER SURGERY Right 02/1964   "cut little finger off; had it reattached"   I & D EXTREMITY  Right 09/10/2017   Procedure: RIGHT FOOT DEBRIDEMENT;  Surgeon: [name], MD;  Location: [facility];  Service: Orthopedics;  Laterality: Right;   MRI of brain  09/2009   chronic microvascular ischemia   OSA Sleep Study     per Dr. 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

## 2021-02-25 DIAGNOSIS — M7021 Olecranon bursitis, right elbow: Secondary | ICD-10-CM | POA: Diagnosis not present

## 2021-02-25 DIAGNOSIS — M7022 Olecranon bursitis, left elbow: Secondary | ICD-10-CM | POA: Diagnosis not present

## 2021-02-25 DIAGNOSIS — M25522 Pain in left elbow: Secondary | ICD-10-CM | POA: Diagnosis not present

## 2021-02-28 ENCOUNTER — Ambulatory Visit (INDEPENDENT_AMBULATORY_CARE_PROVIDER_SITE_OTHER): Payer: Medicare Other

## 2021-02-28 ENCOUNTER — Other Ambulatory Visit: Payer: Self-pay

## 2021-02-28 DIAGNOSIS — Z5181 Encounter for therapeutic drug level monitoring: Secondary | ICD-10-CM | POA: Diagnosis not present

## 2021-02-28 DIAGNOSIS — I4819 Other persistent atrial fibrillation: Secondary | ICD-10-CM

## 2021-02-28 LAB — POCT INR: INR: 2.3 (ref 2.0–3.0)

## 2021-02-28 NOTE — Patient Instructions (Signed)
Description   Continue on same dosage of Warfarin 1 tablet daily except 1.5 tablets on Sundays and Thursdays. Recheck INR in 6 weeks. Call Coumadin clinic for any changes in medications or up coming procedures. [phone].

## 2021-03-04 ENCOUNTER — Ambulatory Visit (INDEPENDENT_AMBULATORY_CARE_PROVIDER_SITE_OTHER): Payer: Medicare Other

## 2021-03-04 ENCOUNTER — Other Ambulatory Visit: Payer: Self-pay

## 2021-03-04 DIAGNOSIS — E538 Deficiency of other specified B group vitamins: Secondary | ICD-10-CM | POA: Diagnosis not present

## 2021-03-04 MED ORDER — CYANOCOBALAMIN 1000 MCG/ML IJ SOLN
1000.0000 ug | Freq: Once | INTRAMUSCULAR | Status: AC
  Administered 2021-03-04: 15:00:00 1000 ug via INTRAMUSCULAR

## 2021-03-04 NOTE — Progress Notes (Signed)
Per orders of Dr. Duncan, injection of monthly B12 1000 mcg/ml given by Fruma [place] P Makesha Belitz, CMA in Right Deltoid. Patient tolerated injection well.  

## 2021-03-07 ENCOUNTER — Other Ambulatory Visit: Payer: Self-pay | Admitting: Cardiovascular Disease

## 2021-03-07 DIAGNOSIS — I482 Chronic atrial fibrillation, unspecified: Secondary | ICD-10-CM

## 2021-03-07 NOTE — Telephone Encounter (Signed)
Prescription refill request received for warfarin [place]: 08/15/20 [name])  Next INR check: 04/11/21 Warfarin tablet strength: 4mg   Appropriate dose and refill sent to requested pharmacy.

## 2021-03-11 ENCOUNTER — Other Ambulatory Visit: Payer: Self-pay | Admitting: Family Medicine

## 2021-03-11 DIAGNOSIS — R1319 Other dysphagia: Secondary | ICD-10-CM

## 2021-03-11 DIAGNOSIS — K222 Esophageal obstruction: Secondary | ICD-10-CM

## 2021-03-13 DIAGNOSIS — M7022 Olecranon bursitis, left elbow: Secondary | ICD-10-CM | POA: Diagnosis not present

## 2021-03-13 DIAGNOSIS — M7021 Olecranon bursitis, right elbow: Secondary | ICD-10-CM | POA: Diagnosis not present

## 2021-03-19 ENCOUNTER — Encounter: Payer: Self-pay | Admitting: Neurology

## 2021-03-19 ENCOUNTER — Other Ambulatory Visit: Payer: Self-pay

## 2021-03-19 ENCOUNTER — Ambulatory Visit (INDEPENDENT_AMBULATORY_CARE_PROVIDER_SITE_OTHER): Payer: Medicare Other | Admitting: Neurology

## 2021-03-19 VITALS — BP 97/61 | HR 94 | Ht 66.0 in | Wt 206.5 lb

## 2021-03-19 DIAGNOSIS — G4733 Obstructive sleep apnea (adult) (pediatric): Secondary | ICD-10-CM | POA: Insufficient documentation

## 2021-03-19 NOTE — Progress Notes (Signed)
PATIENT: [name] DOB: April 14, 1930  REASON FOR VISIT: follow up HISTORY FROM: patient PRIMARY NEUROLOGIST: Dr Jannifer Franklin   Dr. Darnella Zeiter sees him for sleep medicine.   HISTORY OF PRESENT ILLNESS: 03/19/21: 03-19-2021.  [name] is an 88 year old male with a long standing history of obstructive sleep apnea on BiPAP.  He has co-morbidities such as diastolic heart failure, atrial fibrillation,  He returns today for follow-up on a new BIPAP machine . He waited his May 2022 for the new machine and finally got it 1 month ago.  He reports that he continues to use  his old machine in the interval-.  Unable to get a new ResMed machine.   They did offer him a Wynonia Musty he prefers [facility].  He received finally an Sharyne Peach- [name] underwent a split-night polysomnography at our sleep lab on 08-13-2020.  He has a past medical history of bradycardia, chronic kidney disease stage III, coronary artery disease, diabetes mellitus with complications of peripheral neuropathy, abdominal distention, obesity, gout, gastric ulcer, esophageal stricture, hyperlipidemia, hypertension, persistent atrial fibrillation and vertigo.  The patient was first initiated on the CPAP but needed BiPAP he could not initiate sleep on the CPAP.  BiPAP started at 13/9 cmH2O we explored the pressure up to 17/12.  That allowed 180 minutes of sleep time without apnea or hypopnea.  He continues to like a full facemask.  He did have some periodic limb movements but no arousals were related he uses a medium size Quatro full facemask.  I would consider this patient BiPAP dependent.  Today's Epworth sleepiness scale is 2 out of 24 points to a very good control of sleepiness.  [name] first compliance visit on the new sleep BiPAP shows 100% compliance by days and time with an average of 10 hours at night 17/12 centimeters water pressure and a residual AHI of 1.6 very little air leak, some central apneas, however this is the optimal  setting for him.  We will follow him yearly from now on.              REVIEW OF SYSTEMS: Out of a complete 14 system review of symptoms, the patient complains only of the following symptoms, and all other reviewed systems are negative.  See HPI  ALLERGIES: Allergies  Allergen Reactions   Penicillins Shortness Of Breath and Swelling    Patient tolerated Rocephin injection 2019  PATIENT HAS HAD A PCN REACTION WITH IMMEDIATE RASH, FACIAL/TONGUE/THROAT SWELLING, SOB, OR LIGHTHEADEDNESS WITH HYPOTENSION:  #  #  YES  #  Has patient had a PCN reaction causing severe rash involving mucus membranes or skin necrosis: NO Has patient had a PCN reaction that required hospitalization NO Has patient had a PCN reaction occurring within the last 10 years: NO If all of the above answers are "NO", then may proceed with Cephalosporin use.    Atorvastatin Other (See Comments)    No energy, just felt bad     Pravastatin Other (See Comments)    myalgias   Colchicine     Due to renal function- can use prednisone if needed    Hydrocodone Other (See Comments)    "swimmy headed" after use   Nsaids     Due to renal function   Uloric [Febuxostat] Other (See Comments)    Would avoid, worsening gout sx with use.    Vancomycin Other (See Comments)    Red man syndrome- tolerated with slow run and benadryl use  Biaxin [Clarithromycin] Diarrhea   Doxycycline Other (See Comments)    GI upset    HOME MEDICATIONS: Outpatient Medications Prior to Visit  Medication Sig Dispense Refill   acetaminophen (TYLENOL) 500 MG tablet Take 1,000 mg by mouth every 8 (eight) hours as needed for mild pain.     allopurinol (ZYLOPRIM) 100 MG tablet TAKE 1 TABLET BY MOUTH EVERY DAY (Patient taking differently: Take 100 mg by mouth 2 (two) times daily.) 90 tablet 3   baclofen (LIORESAL) 10 MG tablet TAKE 0.5-1 TABLETS (5-10 MG TOTAL) BY MOUTH AT BEDTIME AS NEEDED FOR MUSCLE SPASMS. 90 tablet 1   cyanocobalamin  (,VITAMIN B-12,) 1000 MCG/ML injection 1063mcg IM every 28 days 1 mL    diltiazem (CARDIZEM CD) 180 MG 24 hr capsule TAKE 1 CAPSULE BY MOUTH EVERY DAY 90 capsule 2   fluticasone (FLONASE) 50 MCG/ACT nasal spray Place 2 sprays into both nostrils daily.     furosemide (LASIX) 20 MG tablet TAKE 1 TABLET BY MOUTH DAILY. PLEASE MAKE YEARLY APPT WITH DR. Burt Knack FOR MAY BEFORE ANYMORE REFILLS 90 tablet 3   lisinopril (ZESTRIL) 10 MG tablet Take 1 tablet (10 mg total) by mouth daily. (Patient taking differently: Take 5 mg by mouth daily.)     metoprolol tartrate (LOPRESSOR) 25 MG tablet TAKE 1 TABLET BY MOUTH TWICE A DAY 180 tablet 3   omeprazole (PRILOSEC) 40 MG capsule TAKE 1 CAPSULE BY MOUTH EVERY DAY 90 capsule 1   sodium chloride (OCEAN) 0.65 % SOLN nasal spray Place 1 spray into both nostrils as needed for congestion.     tamsulosin (FLOMAX) 0.4 MG CAPS capsule Take 1 capsule (0.4 mg total) by mouth daily. 90 capsule 3   triamcinolone (NASACORT) 55 MCG/ACT AERO nasal inhaler Place 2 sprays into the nose daily. 2 sprays each nostril at night (Patient taking differently: Place 2 sprays into the nose at bedtime. 2 sprays each nostril at night) 1 each 12   warfarin (COUMADIN) 4 MG tablet TAKE 1 TO 1 AND 1/2 TABLETS BY MOUTH DAILY OR AS DIRECTED BY COUMADIN CLINIC 120 tablet 0   clindamycin (CLEOCIN) 300 MG capsule Take 1 capsule (300 mg total) by mouth 3 (three) times daily. (Patient not taking: Reported on 01/02/2021) 30 capsule 0   predniSONE (DELTASONE) 10 MG tablet Take as directed for 12 days.  Daily dose 6,6,5,5,4,4,3,3,2,2,1,1. (Patient not taking: No sig reported) 42 tablet 0   sulfamethoxazole-trimethoprim (BACTRIM DS) 800-160 MG tablet Take 1 tablet by mouth 2 (two) times daily. (Patient not taking: Reported on 01/02/2021) 20 tablet 0   traMADol (ULTRAM) 50 MG tablet Take 1 tablet (50 mg total) by mouth every 6 (six) hours as needed. 20 tablet 0   No facility-administered medications prior to  visit.    PAST MEDICAL HISTORY: Past Medical History:  Diagnosis Date   Aortic stenosis    Arthritis    Bradycardia    a. nocturnal with pauses overnight on tele likely due to OSA 10/2015.   Cancer ([place])    skin cancer- burned off   CKD (chronic kidney disease), stage III ([facility])    Coronary artery disease    a. prev nonobst. b. LHC 10/24/15: occlusion of small diffusely diseased OM2 s/p balloon angioplasty, mild nonobstructive disease of mLAD and mRCA, normal LVEDP   Diabetes mellitus without complication ([place])    61/44/31 Pt's daughter states patient does not  have diabetes.  A1C 07/23/20 was 7.2, patientr had been on medication.   Diabetic peripheral  neuropathy ([place]) 10/04/2019   Diverticulosis    Esophageal reflux    Esophageal stricture from GERD 06/08/2011   With stricture at GE junction on EGD, dilated 06/2011    Gastric ulcer    on EGD 2013   Gout, unspecified    "on daily RX" (09/09/2017)   History of blood transfusion 02/1964   "when I had right little finger cut off"   Hyperlipidemia    Hypertension    Obesity, unspecified    Orthopnea    OSA on CPAP    Paroxysmal nocturnal dyspnea    Persistent atrial fibrillation ([place])    Pneumonia    Vertigo     PAST SURGICAL HISTORY: Past Surgical History:  Procedure Laterality Date   AMPUTATION Right 03/03/2019   Procedure: RIGHT 2ND TOE AMPUTATION;  Surgeon: [name], MD;  Location: [facility];  Service: Orthopedics;  Laterality: Right;   AMPUTATION Right 01/03/2021   Procedure: RIGHT 3RD TOE AMPUTATION;  Surgeon: [name], MD;  Location: [facility];  Service: Orthopedics;  Laterality: Right;   BALLOON DILATION N/A 10/22/2014   Procedure: BALLOON DILATION;  Surgeon: [name], MD;  Location: [facility];  Service: Endoscopy;  Laterality: N/A;   CARDIAC CATHETERIZATION  07/2008   CARDIAC CATHETERIZATION N/A 10/24/2015   Procedure: Left Heart Cath and Coronary Angiography;  Surgeon: [name], MD;  Location: [facility];  Service: Cardiovascular;  Laterality: N/A;   CARDIAC CATHETERIZATION N/A 10/24/2015   Procedure: Coronary Balloon Angioplasty;  Surgeon: [name], MD;  Location: [facility];  Service: Cardiovascular;  Laterality: N/A;   Carotid Dopplers  09/2009   no sig extracranial stenosis and vertebral arteries had antegrade flow   CATARACT EXTRACTION W/ INTRAOCULAR LENS  IMPLANT, BILATERAL Bilateral    ESOPHAGOGASTRODUODENOSCOPY (EGD) WITH ESOPHAGEAL DILATION     "I've had it stretched 3 times" (10/23/2015)   ESOPHAGOGASTRODUODENOSCOPY (EGD) WITH ESOPHAGEAL DILATION  06/2011   /medical hx above  (09/09/2017)   ESOPHAGOGASTRODUODENOSCOPY (EGD) WITH PROPOFOL N/A 10/22/2014   Procedure: ESOPHAGOGASTRODUODENOSCOPY (EGD) WITH PROPOFOL;  Surgeon: [name], MD;  Location: [facility];  Service: Endoscopy;  Laterality: N/A;   FINGER SURGERY Right 02/1964   "cut little finger off; had it reattached"   I & D EXTREMITY Right 09/10/2017   Procedure: RIGHT FOOT DEBRIDEMENT;  Surgeon: [name], MD;  Location: [facility];  Service: Orthopedics;  Laterality: Right;   MRI of brain  09/2009   chronic microvascular ischemia   OSA Sleep Study     per Dr. Brett Fairy   SHOULDER ARTHROSCOPY W/ ROTATOR CUFF REPAIR Right 2008    FAMILY HISTORY: Family History  Problem Relation Age of Onset   Stomach cancer Mother        died in 54's    Hypertension Father        died in his 57's pna likely dementia   Dementia Father    Pneumonia Father    Heart disease Brother        S/P CABG   Hypertension Other    Sleep apnea Daughter    Prostate cancer Neg Hx    Colon cancer Neg Hx     SOCIAL HISTORY: Social History   Socioeconomic History   Marital status: Widowed    Spouse name: Not on file   Number of children: 3   Years of education: Not on file   Highest education level: Not on file  Occupational History   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  Tobacco  Use   Smoking status: Former    Packs/day: 0.12    Years: 4.00    Pack years: 0.48    Types: Cigarettes    Quit date: 03/16/1969    Years since quitting: 52.0   Smokeless tobacco: Former   Tobacco comments:    "chewed when I smoked; smoked 1 pack/week for about 4 years, quit 1971  Vaping Use   Vaping Use: Never used  Substance and Sexual Activity   Alcohol use: Not Currently    Alcohol/week: 0.0 standard drinks    Comment: 09/09/2017  "Quit all alcohol in 1970's"   Drug use: Never   Sexual activity: Not on file  Other Topics Concern   Not on file  Social History Narrative   Lives in [place], married 1953, widowed after 62 years   Retired Administrator, still does Biomedical scientist.   He is not routinely exercising.   Daily caffeine    Social Determinants of Health   Financial Resource Strain: Low Risk    Difficulty of Paying Living Expenses: Not very hard  Food Insecurity: No Food Insecurity   Worried About Charity fundraiser in the Last Year: Never true   Ran Out of Food in the Last Year: Never true  Transportation Needs: No Transportation Needs   Lack of Transportation (Medical): No   Lack of Transportation (Non-Medical): No  Physical Activity: Insufficiently Active   Days of Exercise per Week: 7 days   Minutes of Exercise per Session: 20 min  Stress: No Stress Concern Present   Feeling of Stress : Not at all  Social Connections: Moderately Integrated   Frequency of Communication with Friends and Family: Twice a week   Frequency of Social Gatherings with Friends and Family: Once a week   Attends Religious Services: 1 to 4 times per year   Active Member of [employer] or Organizations: No   Attends Archivist Meetings: 1 to 4 times per year   Marital Status: Widowed  Intimate Partner Violence: Not At Risk   Fear of Current or Ex-Partner: No   Emotionally Abused: No   Physically Abused: No   Sexually Abused: No      PHYSICAL EXAM  Vitals:   03/19/21 0917   BP: 97/61  Pulse: 94  Weight: 206 lb 8 oz (93.7 kg)  Height: 5\' 6"  (1.676 m)   Body mass index is 33.33 kg/m.  Generalized: Well developed, in no acute distress  Chest: Lungs clear to auscultation bilaterally. Hoarseness of voice.  Neck size; 19"  Mallampati: 2- but stubby low uvula with lateral pillow.  Neurological examination : Mentation: Alert and oriented to time, place, history taking. Follows all commands speech and language fluent Cranial nerve : smell and taste intact-  Extraocular movements were full, visual field were full on confrontational test. Head turning and shoulder shrug  were normal and symmetric. Motor/ Sensory: deferred. This patient has known DM neuropathy and gouty arthritis.  Affecting also arms and hands.   DIAGNOSTIC DATA (LABS, IMAGING, TESTING) - I reviewed patient records, labs, notes, testing and imaging myself where available.  CPAP compliance reviewed 03-19-2021  Kathrynn Ducking, MD Study date: 10/04/19   Patient Information  Name MRN Description  [name] [id number] 88 y.o. male   Interpretation Summary         HISTORY: referring provider ; Dr. Sharol Given.    [name] is an 88 year old gentleman  with history of diabetes who reports numbness in the hands bilaterally.  He has some numbness in the feet but not to a significant degree.  Is having trouble using his hands because of the lack of sensation.  He is being evaluated for a possible neuropathy.he has gout.    NERVE CONDUCTION STUDIES:   Nerve conduction studies were performed on both upper extremities.  The distal motor latency for the right median nerve was prolonged with a low motor amplitude.  Distal motor latency and motor amplitude for the left median nerve was normal.  The distal motor latency for the left ulnar nerve was prolonged with a low motor amplitude and the right ulnar nerve reveals a normal distal motor latency with a low motor amplitude.  The nerve conduction  velocities for the median and ulnar nerves were normal bilaterally.  Sensory latencies for the radial nerves were normal bilaterally but were absent for the median and ulnar nerves bilaterally.  The F-wave latencies for the ulnar nerves were prolonged bilaterally.   Nerve conduction studies were performed on the right lower extremity.  The distal motor latencies for the right peroneal and posterior tibial nerves were normal with low motor amplitudes seen for these nerves.  Slowing was seen for these nerves as well.  The sensory latencies for the sural and peroneal nerves were unobtainable.  The F-wave latency of the right posterior tibial nerve was markedly prolonged.   EMG STUDIES:   EMG study was performed on the left upper extremity:   The first dorsal interosseous muscle reveals 2 to 4 K units with full recruitment. No fibrillations or positive waves were noted. The abductor pollicis brevis muscle reveals 2 to 4 K units with decreased recruitment. No fibrillations or positive waves were noted. The extensor indicis proprius muscle reveals 1 to 3 K units with full recruitment. No fibrillations or positive waves were noted. The pronator teres muscle reveals 2 to 3 K units with full recruitment. No fibrillations or positive waves were noted. The biceps muscle reveals 1 to 2 K units with full recruitment. No fibrillations or positive waves were noted. The triceps muscle reveals 2 to 4 K units with full recruitment. No fibrillations or positive waves were noted. The anterior deltoid muscle reveals 2 to 3 K units with full recruitment. No fibrillations or positive waves were noted. The cervical paraspinal muscles were tested at 2 levels. No abnormalities of insertional activity were seen at either level tested. There was good relaxation.     IMPRESSION:   Nerve conduction studies done on both upper extremities and on the right lower extremity shows evidence of a generalized sensorimotor peripheral  neuropathy of moderate severity, likely related to diabetes.  EMG evaluation of the left upper extremity shows no evidence of an overlying cervical radiculopathy.   Nathan Alexanders MD 10/04/2019 3:27 PM   [facility] [address] [place], [place] [address]   Phone [phone] Fax [phone]   Encounter-Level Documents on 10/04/2019:  Document on 10/04/2019 5:47 PM by Kathrynn Ducking, MD: After Visit Summary Document on 10/04/2019 3:33 PM by Kathrynn Ducking, MD: After Visit Summary  Lab Results  Component Value Date   WBC 6.7 01/03/2021   HGB 14.6 01/03/2021   HCT 44.7 01/03/2021   MCV 90.3 01/03/2021   PLT 211 01/03/2021      Component Value Date/Time   NA 133 (L) 01/03/2021 1056   K 4.9 01/03/2021 1056   CL 104 01/03/2021 1056  CO2 21 (L) 01/03/2021 1056   GLUCOSE 114 (H) 01/03/2021 1056   BUN 32 (H) 01/03/2021 1056   CREATININE 1.84 (H) 01/03/2021 1056   CREATININE 1.46 (H) 11/06/2019 1118   CALCIUM 9.0 01/03/2021 1056   PROT 7.5 09/17/2020 1118   PROT 7.1 09/07/2019 1020   ALBUMIN 4.0 09/17/2020 1118   AST 26 09/17/2020 1118   ALT 24 09/17/2020 1118   ALKPHOS 86 09/17/2020 1118   BILITOT 1.1 09/17/2020 1118   GFRNONAA 35 (L) 01/03/2021 1056   GFRAA 55 (L) 03/03/2019 0643   Lab Results  Component Value Date   CHOL 166 05/16/2019   HDL 26.40 (L) 05/16/2019   LDLCALC 82 10/24/2015   LDLDIRECT 95.0 05/16/2019   TRIG 279.0 (H) 05/16/2019   CHOLHDL 6 05/16/2019   Lab Results  Component Value Date   HGBA1C 7.2 (H) 07/23/2020   Lab Results  Component Value Date   CVELFYBO17 510 07/23/2020   Lab Results  Component Value Date   TSH 2.33 05/16/2019      ASSESSMENT AND PLAN:   88 y.o. year old male patient of Dr Joanna Hews is followed by me for Sleep Medicine related issues only.   OSA on BiPAP, BiPAP dependent .  - BiPAP compliance excellent- he should continue at current settings 17/ 12 cm water- FFM .  - Good  control of AHI in Complex Apnea. - F/U in just under one year or sooner if needed.   Larey Seat, MD 03/19/2021, 9:41 AM [facility] [address] [place], [place] [address] [phone]

## 2021-03-19 NOTE — Patient Instructions (Signed)

## 2021-03-26 DIAGNOSIS — U071 COVID-19: Secondary | ICD-10-CM | POA: Diagnosis not present

## 2021-04-01 ENCOUNTER — Telehealth: Payer: Medicare Other

## 2021-04-08 ENCOUNTER — Ambulatory Visit (INDEPENDENT_AMBULATORY_CARE_PROVIDER_SITE_OTHER): Payer: Medicare Other

## 2021-04-08 ENCOUNTER — Other Ambulatory Visit: Payer: Self-pay

## 2021-04-08 DIAGNOSIS — E538 Deficiency of other specified B group vitamins: Secondary | ICD-10-CM | POA: Diagnosis not present

## 2021-04-08 MED ORDER — CYANOCOBALAMIN 1000 MCG/ML IJ SOLN
1000.0000 ug | Freq: Once | INTRAMUSCULAR | Status: AC
  Administered 2021-04-08: 12:00:00 1000 ug via INTRAMUSCULAR

## 2021-04-08 NOTE — Progress Notes (Signed)
Per orders of Dr. Duncan, monthly injection of B12 given by Motty Borin G Safiyyah Vasconez. Patient tolerated injection well.  

## 2021-04-09 ENCOUNTER — Telehealth: Payer: Self-pay | Admitting: Family Medicine

## 2021-04-09 NOTE — Telephone Encounter (Signed)
Opened in error

## 2021-04-11 ENCOUNTER — Ambulatory Visit (INDEPENDENT_AMBULATORY_CARE_PROVIDER_SITE_OTHER): Payer: Medicare Other | Admitting: *Deleted

## 2021-04-11 ENCOUNTER — Other Ambulatory Visit: Payer: Self-pay

## 2021-04-11 DIAGNOSIS — I4819 Other persistent atrial fibrillation: Secondary | ICD-10-CM

## 2021-04-11 DIAGNOSIS — Z5181 Encounter for therapeutic drug level monitoring: Secondary | ICD-10-CM

## 2021-04-11 LAB — POCT INR: INR: 2.2 (ref 2.0–3.0)

## 2021-04-11 NOTE — Patient Instructions (Signed)
Description   Continue on same dosage of Warfarin 1 tablet daily except 1.5 tablets on Sundays and Thursdays. Recheck INR in 6 weeks. Call Coumadin clinic for any changes in medications or up coming procedures. [phone].

## 2021-04-14 ENCOUNTER — Ambulatory Visit: Payer: Medicare Other | Admitting: Orthopedic Surgery

## 2021-04-14 ENCOUNTER — Telehealth: Payer: Self-pay | Admitting: Family Medicine

## 2021-04-14 NOTE — Telephone Encounter (Signed)
-----   Message from Tonia Ghent, MD sent at 04/09/2021  1:24 PM EST ----- Please set follow up re: DM2 when possible.  We can do labs at the visit.  Thanks.    Brigitte Pulse

## 2021-04-14 NOTE — Telephone Encounter (Signed)
Called pt. Vm not set up.

## 2021-04-15 ENCOUNTER — Ambulatory Visit: Payer: Medicare Other | Admitting: Orthopedic Surgery

## 2021-04-17 DIAGNOSIS — U071 COVID-19: Secondary | ICD-10-CM | POA: Diagnosis not present

## 2021-04-24 ENCOUNTER — Telehealth: Payer: Self-pay

## 2021-04-24 DIAGNOSIS — I350 Nonrheumatic aortic (valve) stenosis: Secondary | ICD-10-CM

## 2021-04-24 NOTE — Telephone Encounter (Signed)
Per Dr. Burt Knack, arranged 1 year echo and office visit on 08/18/2021. The patient was grateful for call and agrees with plan.

## 2021-04-27 ENCOUNTER — Other Ambulatory Visit: Payer: Self-pay | Admitting: Cardiovascular Disease

## 2021-04-30 DIAGNOSIS — Z20822 Contact with and (suspected) exposure to covid-19: Secondary | ICD-10-CM | POA: Diagnosis not present

## 2021-05-07 ENCOUNTER — Telehealth: Payer: Self-pay

## 2021-05-07 ENCOUNTER — Telehealth: Payer: Self-pay | Admitting: Family Medicine

## 2021-05-07 ENCOUNTER — Encounter: Payer: Self-pay | Admitting: Family Medicine

## 2021-05-07 NOTE — Chronic Care Management (AMB) (Signed)
Chronic Care Management Pharmacy Assistant   Name: [name]  MRN: [id number] DOB: [birth year]  Reason for Encounter:Hypertension Disease State   Recent office visits:  04/08/21-Family Medicine- B12 injection  11/23/20-Family Medicine-Telemedicine- AWV-Discussed screenings, vaccines, recommend dental exam.  Recent consult visits:  03/19/21-Neurology-Carmen Dohmeier,MD-Patient presented for sleep medicine.NCV/EMG studies done-continue with BiPAP - no medication changes 03/13/21-Orthopedic surgery-Kristin Shepperson- no data found 02/11/21-Orthopedic surgery-Marcus Duda,MD-Patient presented for post op right foot 3rd toe amputation-given HEP  achilles stretching.Follow up 2 months, no medication changes 01/21/21-Orthopedic Surgery-Marcus Duda,MD-Ptient presented for follow up toe amputation and gout flare up.Recommend patient discuss with his nephrologist about taking the allopurinol and colchicine with his renal disease.Recommended compression stockings for both lower extremities reevaluate in 3 weeks. 01/13/21-orthopedic surgery-Marcus Duda,MD-Patient presented for post op toe amputation-No medication changes 01/03/21-[facility]- Beverely Low Duda,MD- Patient presented for toe amputation- No admission-Discharged to home  12/30/20- Orthopedic surgey-Marcus Duda,MD-patient presented for right foot pain. Plan surgery-Increase Allopurinol to 2 times daily, prescription for Bactrim DS 800-160mg  take 1 tablet  2 times daily  12/16/20-Orthopedic Surgery-Marcus Duda,MD-Patient presented for left foot/ankle pain-labs ordered, given a silicone pad for toe  1/94/17-EYCXKGYJEHUDJS-HFWYOVZCHYI Newman,MD-Patient presented for evaluation of sinus pressure. Both ear  canals cleaned out-recommend use of saline irrigation  12/09/20-Nephrology-Vikas Singh-no data found 12/05/20-Nephrology-Megan Millikan,NP-Patient presented for follow up sleep apnea-No medication changes 12/02/20-Orthopedic surgery- removal  of wrap left lower extremity-no medication  11/28/20-Orthopedic surgery-Marcus Duda,MD-Patient presented for venous insuffiiency.Recommend to stop colchicine,increase water intake, apply dynaflex wrap left leg  Hospital visits:  None in previous 6 months  Medications: Outpatient Encounter Medications as of 05/07/2021  Medication Sig   acetaminophen (TYLENOL) 500 MG tablet Take 1,000 mg by mouth every 8 (eight) hours as needed for mild pain.   allopurinol (ZYLOPRIM) 100 MG tablet TAKE 1 TABLET BY MOUTH EVERY DAY (Patient taking differently: Take 100 mg by mouth 2 (two) times daily.)   baclofen (LIORESAL) 10 MG tablet TAKE 0.5-1 TABLETS (5-10 MG TOTAL) BY MOUTH AT BEDTIME AS NEEDED FOR MUSCLE SPASMS.   cyanocobalamin (,VITAMIN B-12,) 1000 MCG/ML injection 1058mcg IM every 28 days   diltiazem (CARDIZEM CD) 180 MG 24 hr capsule TAKE 1 CAPSULE BY MOUTH EVERY DAY   fluticasone (FLONASE) 50 MCG/ACT nasal spray Place 2 sprays into both nostrils daily.   furosemide (LASIX) 20 MG tablet TAKE 1 TABLET BY MOUTH DAILY. PLEASE MAKE YEARLY APPT WITH DR. Burt Knack FOR MAY BEFORE ANYMORE REFILLS   lisinopril (ZESTRIL) 10 MG tablet Take 1 tablet (10 mg total) by mouth daily. (Patient taking differently: Take 5 mg by mouth daily.)   metoprolol tartrate (LOPRESSOR) 25 MG tablet TAKE 1 TABLET BY MOUTH TWICE A DAY   omeprazole (PRILOSEC) 40 MG capsule TAKE 1 CAPSULE BY MOUTH EVERY DAY   sodium chloride (OCEAN) 0.65 % SOLN nasal spray Place 1 spray into both nostrils as needed for congestion.   tamsulosin (FLOMAX) 0.4 MG CAPS capsule Take 1 capsule (0.4 mg total) by mouth daily.   triamcinolone (NASACORT) 55 MCG/ACT AERO nasal inhaler Place 2 sprays into the nose daily. 2 sprays each nostril at night (Patient taking differently: Place 2 sprays into the nose at bedtime. 2 sprays each nostril at night)   warfarin (COUMADIN) 4 MG tablet TAKE 1 TO 1 AND 1/2 TABLETS BY MOUTH DAILY OR AS DIRECTED BY COUMADIN CLINIC   No  facility-administered encounter medications on file as of 05/07/2021.    Recent Office Vitals: BP Readings from Last 3 Encounters:  03/19/21 97/61  01/03/21 115/74  12/05/20 128/79   Pulse Readings from Last 3 Encounters:  03/19/21 94  01/03/21 76  12/05/20 (!) 103    Wt Readings from Last 3 Encounters:  03/19/21 206 lb 8 oz (93.7 kg)  01/03/21 212 lb (96.2 kg)  12/05/20 213 lb 3.2 oz (96.7 kg)     Kidney Function Lab Results  Component Value Date/Time   CREATININE 1.84 (H) 01/03/2021 10:56 AM   CREATININE 1.22 09/17/2020 11:18 AM   CREATININE 1.46 (H) 11/06/2019 11:18 AM   GFR 28.41 (L) 07/29/2020 03:25 PM   GFRNONAA 35 (L) 01/03/2021 10:56 AM   GFRAA 55 (L) 03/03/2019 06:43 AM    BMP Latest Ref Rng & Units 01/03/2021 09/17/2020 09/06/2020  Glucose 70 - 99 mg/dL 114(H) 116(H) -  BUN 8 - 23 mg/dL 32(H) 15 -  Creatinine 0.61 - 1.24 mg/dL 1.84(H) 1.22 1.1  BUN/Creat Ratio 6 - 22 (calc) - - -  Sodium 135 - 145 mmol/L 133(L) 135 -  Potassium 3.5 - 5.1 mmol/L 4.9 4.3 -  Chloride 98 - 111 mmol/L 104 100 -  CO2 22 - 32 mmol/L 21(L) 26 -  Calcium 8.9 - 10.3 mg/dL 9.0 9.4 -     Contacted patient on 05/07/21 to discuss hypertension disease state  Current antihypertensive regimen:  Metoprolol tartrate 25 mg - 1 tablet twice daily Diltiazem 180 mg - 1 capsule daily   Patient verbally confirms he is taking the above medications as directed. Yes  How often are you checking your Blood Pressure? daily  he checks his blood pressure in the morning before taking his medication.  Current home BP readings:  118/66,120/74,120/62 - did not have many BP's due to being ill for several days.         Wrist or arm cuff:arm cuff Caffeine intake:Daily morning coffee Salt intake:limits with adding to foods Over the counter medications including pseudoephedrine or NSAIDs? Tylenol - Claritin, benadryl- Patient reports he has been ill for several days. Has appointment with PCP.  Any readings  above 180/120? No   What recent interventions/DTPs have been made by any provider to improve Blood Pressure control since last CPP Visit: The patient reports he takes BP at home daily   Any recent hospitalizations or ED visits since last visit with CPP? Yes- 01/03/21-[facility] day surgery-no admission  toe amputation   What diet changes have been made to improve Blood Pressure Control?   Patient reports eating smaller portions of meals and drinking plenty of water  What exercise is being done to improve your Blood Pressure Control?   Patient reports he stays busy with lawn care  Adherence Review: Is the patient currently on ACE/ARB medication? Yes Does the patient have >5 day gap between last estimated fill dates? No   Star Rating Drugs:  Medication:  Last Fill: Day Supply Lisinopril 10mg  03/12/21 90   Care Gaps: Annual wellness visit in last year? Yes Most Recent BP reading: 97/61  94-P 03/19/21  If Diabetic: Most recent A1C reading:7.2 Last eye exam / retinopathy screening:2021 Last diabetic foot exam: 06/20/20  Upcoming appointments: PCP appointment on 05/16/21   Marjo Bicker CPP notified  Avel Sensor, [place] Assistant [phone]  Total time spent for month CPA: 40 min

## 2021-05-07 NOTE — Telephone Encounter (Signed)
Mrs. [name] called in and wanted to get her dad an appointment due to he was feeling woosy and has a sinus infection, told her that Dr. Damita Dunnings first ava is 3/10 and she got upset and said that he is a patient and that's not right and wanted to speak to his nurse

## 2021-05-07 NOTE — Telephone Encounter (Signed)
Called and spoke with [place]; patient daughter. I advised her that Dr. Damita Dunnings had a full schedule until 05/16/21 and we could not work patient in for an acute visit at this time as there are several other providers with openings this week. She got very upset with me and stated this keeps happening everytime herself, her husband or her dad needs to be seen they can not be seen by their doctor. I apologized for this and was finally able to get patient scheduled with Romilda Garret, NP tomorrow for a possible sinus infection. Tabitha did want to get patient scheduled for a regualr check up with Dr. Damita Dunnings to address some other concerns and I scheduled that for 05/16/21.

## 2021-05-07 NOTE — Telephone Encounter (Signed)
This has already been addressed. See TE note.

## 2021-05-08 ENCOUNTER — Other Ambulatory Visit: Payer: Self-pay

## 2021-05-08 ENCOUNTER — Ambulatory Visit (INDEPENDENT_AMBULATORY_CARE_PROVIDER_SITE_OTHER): Payer: Medicare Other | Admitting: Nurse Practitioner

## 2021-05-08 ENCOUNTER — Ambulatory Visit (INDEPENDENT_AMBULATORY_CARE_PROVIDER_SITE_OTHER)
Admission: RE | Admit: 2021-05-08 | Discharge: 2021-05-08 | Disposition: A | Discharge status: Home / Self Care | Payer: Medicare Other | Source: Ambulatory Visit | Attending: Nurse Practitioner | Admitting: Nurse Practitioner

## 2021-05-08 VITALS — BP 120/62 | HR 81 | Temp 98.1°F | Resp 16 | Ht 66.0 in | Wt 213.3 lb

## 2021-05-08 DIAGNOSIS — J0110 Acute frontal sinusitis, unspecified: Secondary | ICD-10-CM | POA: Insufficient documentation

## 2021-05-08 DIAGNOSIS — R051 Acute cough: Secondary | ICD-10-CM

## 2021-05-08 DIAGNOSIS — R059 Cough, unspecified: Secondary | ICD-10-CM | POA: Diagnosis not present

## 2021-05-08 MED ORDER — DOXYCYCLINE HYCLATE 100 MG PO TABS: 100.0000 mg | ORAL_TABLET | Freq: Two times a day (BID) | ORAL | 0 refills | Status: AC

## 2021-05-08 NOTE — Progress Notes (Signed)
Acute Office Visit  Subjective:    Patient ID: [name], male    DOB: 12/21/1930, 88 y.o.   MRN: [id number]  Chief Complaint  Patient presents with   Sinus Problem    Sx x 2 weeks. Post nasal rip, runny nose, headache, sinus pressure, stuffy nose/head, cough. Covid test not done. Has taking Claritin and maybe one time took Benadryl.     Patient is in today for Sinus  Symptoms over the last 2 weeks Family is sick back and forth No covid test Moderna x2  and one booster Has been using claritin without relief  Symptoms are worsening   Past Medical History:  Diagnosis Date   Aortic stenosis    Arthritis    Bradycardia    a. nocturnal with pauses overnight on tele likely due to OSA 10/2015.   Cancer ([place])    skin cancer- burned off   CKD (chronic kidney disease), stage III (HCC)    Coronary artery disease    a. prev nonobst. b. LHC 10/24/15: occlusion of small diffusely diseased OM2 s/p balloon angioplasty, mild nonobstructive disease of mLAD and mRCA, normal LVEDP   Diabetes mellitus without complication ([place])    95/18/84 Pt's daughter states patient does not  have diabetes.  A1C 07/23/20 was 7.2, patientr had been on medication.   Diabetic peripheral neuropathy ([place]) 10/04/2019   Diverticulosis    Esophageal reflux    Esophageal stricture from GERD 06/08/2011   With stricture at GE junction on EGD, dilated 06/2011    Gastric ulcer    on EGD 2013   Gout, unspecified    "on daily RX" (09/09/2017)   History of blood transfusion 02/1964   "when I had right little finger cut off"   Hyperlipidemia    Hypertension    Obesity, unspecified    Orthopnea    OSA on CPAP    Paroxysmal nocturnal dyspnea    Persistent atrial fibrillation ([place])    Pneumonia    Vertigo     Past Surgical History:  Procedure Laterality Date   AMPUTATION Right 03/03/2019   Procedure: RIGHT 2ND TOE AMPUTATION;  Surgeon: [name], MD;  Location: [place];  Service: Orthopedics;  Laterality:  Right;   AMPUTATION Right 01/03/2021   Procedure: RIGHT 3RD TOE AMPUTATION;  Surgeon: [name], MD;  Location: [place];  Service: Orthopedics;  Laterality: Right;   BALLOON DILATION N/A 10/22/2014   Procedure: BALLOON DILATION;  Surgeon: [name], MD;  Location: [facility];  Service: Endoscopy;  Laterality: N/A;   CARDIAC CATHETERIZATION  07/2008   CARDIAC CATHETERIZATION N/A 10/24/2015   Procedure: Left Heart Cath and Coronary Angiography;  Surgeon: [name], MD;  Location: [place] [facility];  Service: Cardiovascular;  Laterality: N/A;   CARDIAC CATHETERIZATION N/A 10/24/2015   Procedure: Coronary Balloon Angioplasty;  Surgeon: [name], MD;  Location: [place] [facility];  Service: Cardiovascular;  Laterality: N/A;   Carotid Dopplers  09/2009   no sig extracranial stenosis and vertebral arteries had antegrade flow   CATARACT EXTRACTION W/ INTRAOCULAR LENS  IMPLANT, BILATERAL Bilateral    ESOPHAGOGASTRODUODENOSCOPY (EGD) WITH ESOPHAGEAL DILATION     "I've had it stretched 3 times" (10/23/2015)   ESOPHAGOGASTRODUODENOSCOPY (EGD) WITH ESOPHAGEAL DILATION  06/2011   /medical hx above  (09/09/2017)   ESOPHAGOGASTRODUODENOSCOPY (EGD) WITH PROPOFOL N/A 10/22/2014   Procedure: ESOPHAGOGASTRODUODENOSCOPY (EGD) WITH PROPOFOL;  Surgeon: [name], MD;  Location: [facility];  Service: Endoscopy;  Laterality:  N/A;   FINGER SURGERY Right 02/1964   "cut little finger off; had it reattached"   I & D EXTREMITY Right 09/10/2017   Procedure: RIGHT FOOT DEBRIDEMENT;  Surgeon: [name], MD;  Location: [place];  Service: Orthopedics;  Laterality: Right;   MRI of brain  09/2009   chronic microvascular ischemia   OSA Sleep Study     per Dr. Brett Fairy   SHOULDER ARTHROSCOPY W/ ROTATOR CUFF REPAIR Right 2008    Family History  Problem Relation Age of Onset   Stomach cancer Mother        died in 4's    Hypertension Father        died in his 106's pna likely dementia   Dementia  Father    Pneumonia Father    Heart disease Brother        S/P CABG   Hypertension Other    Sleep apnea Daughter    Prostate cancer Neg Hx    Colon cancer Neg Hx     Social History   Socioeconomic History   Marital status: Widowed    Spouse name: Not on file   Number of children: 3   Years of education: Not on file   Highest education level: Not on file  Occupational History   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  Tobacco Use   Smoking status: Former    Packs/day: 0.12    Years: 4.00    Pack years: 0.48    Types: Cigarettes    Quit date: 03/16/1969    Years since quitting: 52.1   Smokeless tobacco: Former   Tobacco comments:    "chewed when I smoked; smoked 1 pack/week for about 4 years, quit 1971  Vaping Use   Vaping Use: Never used  Substance and Sexual Activity   Alcohol use: Not Currently    Alcohol/week: 0.0 standard drinks    Comment: 09/09/2017  "Quit all alcohol in 1970's"   Drug use: Never   Sexual activity: Not on file  Other Topics Concern   Not on file  Social History Narrative   Lives in [place], married 1953, widowed after 31 years   Retired Administrator, still does Biomedical scientist.   He is not routinely exercising.   Daily caffeine    Social Determinants of Health   Financial Resource Strain: Low Risk    Difficulty of Paying Living Expenses: Not very hard  Food Insecurity: No Food Insecurity   Worried About Charity fundraiser in the Last Year: Never true   Ran Out of Food in the Last Year: Never true  Transportation Needs: No Transportation Needs   Lack of Transportation (Medical): No   Lack of Transportation (Non-Medical): No  Physical Activity: Insufficiently Active   Days of Exercise per Week: 7 days   Minutes of Exercise per Session: 20 min  Stress: No Stress Concern Present   Feeling of Stress : Not at all  Social Connections: Moderately Integrated   Frequency of Communication with Friends and Family: Twice  a week   Frequency of Social Gatherings with Friends and Family: Once a week   Attends Religious Services: 1 to 4 times per year   Active Member of [employer] or Organizations: No   Attends Archivist Meetings: 1 to 4 times per year   Marital Status: Widowed  Intimate Partner Violence: Not At Risk   Fear of Current or Ex-Partner: No   Emotionally Abused: No  Physically Abused: No   Sexually Abused: No    Outpatient Medications Prior to Visit  Medication Sig Dispense Refill   acetaminophen (TYLENOL) 500 MG tablet Take 1,000 mg by mouth every 8 (eight) hours as needed for mild pain.     allopurinol (ZYLOPRIM) 100 MG tablet TAKE 1 TABLET BY MOUTH EVERY DAY (Patient taking differently: Take 100 mg by mouth 2 (two) times daily.) 90 tablet 3   cyanocobalamin (,VITAMIN B-12,) 1000 MCG/ML injection 107mcg IM every 28 days 1 mL    diltiazem (CARDIZEM CD) 180 MG 24 hr capsule TAKE 1 CAPSULE BY MOUTH EVERY DAY 90 capsule 1   fluticasone (FLONASE) 50 MCG/ACT nasal spray Place 2 sprays into both nostrils daily.     furosemide (LASIX) 20 MG tablet TAKE 1 TABLET BY MOUTH DAILY. PLEASE MAKE YEARLY APPT WITH DR. Burt Knack FOR MAY BEFORE ANYMORE REFILLS 90 tablet 3   lisinopril (ZESTRIL) 10 MG tablet Take 1 tablet (10 mg total) by mouth daily. (Patient taking differently: Take 5 mg by mouth daily.)     metoprolol tartrate (LOPRESSOR) 25 MG tablet TAKE 1 TABLET BY MOUTH TWICE A DAY 180 tablet 3   omeprazole (PRILOSEC) 40 MG capsule TAKE 1 CAPSULE BY MOUTH EVERY DAY 90 capsule 1   sodium chloride (OCEAN) 0.65 % SOLN nasal spray Place 1 spray into both nostrils as needed for congestion.     tamsulosin (FLOMAX) 0.4 MG CAPS capsule Take 1 capsule (0.4 mg total) by mouth daily. 90 capsule 3   triamcinolone (NASACORT) 55 MCG/ACT AERO nasal inhaler Place 2 sprays into the nose daily. 2 sprays each nostril at night (Patient taking differently: Place 2 sprays into the nose at bedtime. 2 sprays each nostril at  night) 1 each 12   warfarin (COUMADIN) 4 MG tablet TAKE 1 TO 1 AND 1/2 TABLETS BY MOUTH DAILY OR AS DIRECTED BY COUMADIN CLINIC 120 tablet 0   baclofen (LIORESAL) 10 MG tablet TAKE 0.5-1 TABLETS (5-10 MG TOTAL) BY MOUTH AT BEDTIME AS NEEDED FOR MUSCLE SPASMS. 90 tablet 1   No facility-administered medications prior to visit.    Allergies  Allergen Reactions   Penicillins Shortness Of Breath and Swelling    Patient tolerated Rocephin injection 2019  PATIENT HAS HAD A PCN REACTION WITH IMMEDIATE RASH, FACIAL/TONGUE/THROAT SWELLING, SOB, OR LIGHTHEADEDNESS WITH HYPOTENSION:  #  #  YES  #  Has patient had a PCN reaction causing severe rash involving mucus membranes or skin necrosis: NO Has patient had a PCN reaction that required hospitalization NO Has patient had a PCN reaction occurring within the last 10 years: NO If all of the above answers are "NO", then may proceed with Cephalosporin use.    Atorvastatin Other (See Comments)    No energy, just felt bad     Pravastatin Other (See Comments)    myalgias   Colchicine     Due to renal function- can use prednisone if needed    Hydrocodone Other (See Comments)    "swimmy headed" after use   Nsaids     Due to renal function   Uloric [Febuxostat] Other (See Comments)    Would avoid, worsening gout sx with use.    Vancomycin Other (See Comments)    Red man syndrome- tolerated with slow run and benadryl use   Biaxin [Clarithromycin] Diarrhea    Review of Systems  Constitutional:  Positive for appetite change and fatigue. Negative for chills and fever.  HENT:  Positive for congestion, postnasal  drip, sinus pressure and sinus pain. Negative for sore throat.   Respiratory:  Positive for cough (non productive, first in the morning white colored). Negative for shortness of breath.   Cardiovascular:  Negative for chest pain.  Gastrointestinal:  Negative for diarrhea, nausea and vomiting.  Musculoskeletal:  Negative for arthralgias and  myalgias.  Neurological:  Positive for light-headedness. Negative for dizziness and headaches.      Objective:    Physical Exam Constitutional:      Appearance: He is obese.  HENT:     Right Ear: Tympanic membrane, ear canal and external ear normal.     Left Ear: Tympanic membrane, ear canal and external ear normal.     Nose:     Right Sinus: No maxillary sinus tenderness or frontal sinus tenderness.     Left Sinus: Frontal sinus tenderness present. No maxillary sinus tenderness.     Mouth/Throat:     Mouth: Mucous membranes are moist.     Pharynx: Oropharynx is clear.  Cardiovascular:     Rate and Rhythm: Normal rate. Rhythm irregular.     Heart sounds: Murmur heard.  Pulmonary:     Effort: Pulmonary effort is normal.     Breath sounds: Normal breath sounds.  Musculoskeletal:     Right lower leg: 1+ Pitting Edema present.     Left lower leg: 1+ Pitting Edema present.  Lymphadenopathy:     Cervical: No cervical adenopathy.  Neurological:     Mental Status: He is alert.    BP 120/62    Pulse 81    Temp 98.1 F (36.7 C)    Resp 16    Ht 5\' 6"  (1.676 m)    Wt 213 lb 5 oz (96.8 kg)    SpO2 96%    BMI 34.43 kg/m  Wt Readings from Last 3 Encounters:  05/08/21 213 lb 5 oz (96.8 kg)  03/19/21 206 lb 8 oz (93.7 kg)  01/03/21 212 lb (96.2 kg)    Health Maintenance Due  Topic Date Due   Zoster Vaccines- Shingrix (1 of 2) Never done   COVID-19 Vaccine (3 - Booster for Moderna series) 07/04/2019   TETANUS/TDAP  09/20/2019   FOOT EXAM  05/18/2020   OPHTHALMOLOGY EXAM  05/25/2020   HEMOGLOBIN A1C  01/23/2021    There are no preventive care reminders to display for this patient.   Lab Results  Component Value Date   TSH 2.33 05/16/2019   Lab Results  Component Value Date   WBC 6.7 01/03/2021   HGB 14.6 01/03/2021   HCT 44.7 01/03/2021   MCV 90.3 01/03/2021   PLT 211 01/03/2021   Lab Results  Component Value Date   NA 133 (L) 01/03/2021   K 4.9 01/03/2021   CO2  21 (L) 01/03/2021   GLUCOSE 114 (H) 01/03/2021   BUN 32 (H) 01/03/2021   CREATININE 1.84 (H) 01/03/2021   BILITOT 1.1 09/17/2020   ALKPHOS 86 09/17/2020   AST 26 09/17/2020   ALT 24 09/17/2020   PROT 7.5 09/17/2020   ALBUMIN 4.0 09/17/2020   CALCIUM 9.0 01/03/2021   ANIONGAP 8 01/03/2021   GFR 28.41 (L) 07/29/2020   Lab Results  Component Value Date   CHOL 166 05/16/2019   Lab Results  Component Value Date   HDL 26.40 (L) 05/16/2019   Lab Results  Component Value Date   LDLCALC 82 10/24/2015   Lab Results  Component Value Date   TRIG 279.0 (H) 05/16/2019  Lab Results  Component Value Date   CHOLHDL 6 05/16/2019   Lab Results  Component Value Date   HGBA1C 7.2 (H) 07/23/2020       Assessment & Plan:   Problem List Items Addressed This Visit       Respiratory   Acute non-recurrent frontal sinusitis - Primary    Given length of symptoms, age, comorbidities, and physical exam we will treat for acute sinusitis.  Will write doxycycline 100 mg twice daily for 7 days.  Did discuss signs and symptoms will be seen urgent or emergently.  We will reach out to patient's Coumadin clinic to make them aware that patient is put on antimicrobial therapy and they can schedule him appropriately.      Relevant Medications   doxycycline (VIBRA-TABS) 100 MG tablet     Other   Acute cough    Currently having acute cough likely related to the upper respiratory infection.  Daughter was at bedside and is concerned he may be holding fluid on his lungs.  Lungs were clear to auscultation but will get chest x-ray to look at the lungs for possible pleural effusions and pulmonary edema.  Pending result      Relevant Orders   DG Chest 2 View     Meds ordered this encounter  Medications   doxycycline (VIBRA-TABS) 100 MG tablet    Sig: Take 1 tablet (100 mg total) by mouth 2 (two) times daily for 7 days.    Dispense:  14 tablet    Refill:  0    Order Specific Question:   Supervising  Provider    Answer:   Loura Pardon A [[id number]]   This visit occurred during the SARS-CoV-2 public health emergency.  Safety protocols were in place, including screening questions prior to the visit, additional usage of staff PPE, and extensive cleaning of exam room while observing appropriate contact time as indicated for disinfecting solutions.    Romilda Garret, NP

## 2021-05-08 NOTE — Patient Instructions (Signed)
Nice to see you today I will be in touch with the xray results I sent in an antibiotic to your pharmacy Follow up if no improvement or the symptoms worsen

## 2021-05-08 NOTE — Assessment & Plan Note (Signed)
Currently having acute cough likely related to the upper respiratory infection.  Daughter was at bedside and is concerned he may be holding fluid on his lungs.  Lungs were clear to auscultation but will get chest x-ray to look at the lungs for possible pleural effusions and pulmonary edema.  Pending result

## 2021-05-08 NOTE — Assessment & Plan Note (Signed)
Given length of symptoms, age, comorbidities, and physical exam we will treat for acute sinusitis.  Will write doxycycline 100 mg twice daily for 7 days.  Did discuss signs and symptoms will be seen urgent or emergently.  We will reach out to patient's Coumadin clinic to make them aware that patient is put on antimicrobial therapy and they can schedule him appropriately.

## 2021-05-12 ENCOUNTER — Ambulatory Visit (INDEPENDENT_AMBULATORY_CARE_PROVIDER_SITE_OTHER): Payer: Medicare Other

## 2021-05-12 ENCOUNTER — Other Ambulatory Visit: Payer: Self-pay

## 2021-05-12 DIAGNOSIS — Z5181 Encounter for therapeutic drug level monitoring: Secondary | ICD-10-CM | POA: Diagnosis not present

## 2021-05-12 DIAGNOSIS — I4819 Other persistent atrial fibrillation: Secondary | ICD-10-CM | POA: Diagnosis not present

## 2021-05-12 LAB — POCT INR: INR: 1.8 — AB (ref 2.0–3.0)

## 2021-05-12 NOTE — Patient Instructions (Signed)
-   take extra 1/2 tablet warfarin tonight, then - Continue on same dosage of Warfarin 1 tablet daily except 1.5 tablets on Sundays and Thursdays.  - Recheck INR in 6 weeks.  Call Coumadin clinic for any changes in medications or up coming procedures. [phone].

## 2021-05-13 ENCOUNTER — Ambulatory Visit (INDEPENDENT_AMBULATORY_CARE_PROVIDER_SITE_OTHER): Payer: Medicare Other

## 2021-05-13 ENCOUNTER — Other Ambulatory Visit: Payer: Medicare Other

## 2021-05-13 DIAGNOSIS — E538 Deficiency of other specified B group vitamins: Secondary | ICD-10-CM | POA: Diagnosis not present

## 2021-05-13 MED ORDER — CYANOCOBALAMIN 1000 MCG/ML IJ SOLN
1000.0000 ug | Freq: Once | INTRAMUSCULAR | Status: AC
  Administered 2021-05-13: 1000 ug via INTRAMUSCULAR

## 2021-05-13 NOTE — Progress Notes (Signed)
Patient presented for B 12 injection given by Francenia Chimenti, CMA to right deltoid, patient voiced no concerns nor showed any signs of distress during injection.  

## 2021-05-16 ENCOUNTER — Other Ambulatory Visit: Payer: Self-pay

## 2021-05-16 ENCOUNTER — Ambulatory Visit (INDEPENDENT_AMBULATORY_CARE_PROVIDER_SITE_OTHER): Payer: Medicare Other | Admitting: Family Medicine

## 2021-05-16 ENCOUNTER — Encounter: Payer: Self-pay | Admitting: Family Medicine

## 2021-05-16 VITALS — BP 124/72 | HR 76 | Temp 97.6°F | Ht 66.0 in | Wt 213.0 lb

## 2021-05-16 DIAGNOSIS — H6980 Other specified disorders of Eustachian tube, unspecified ear: Secondary | ICD-10-CM | POA: Diagnosis not present

## 2021-05-16 DIAGNOSIS — E119 Type 2 diabetes mellitus without complications: Secondary | ICD-10-CM | POA: Diagnosis not present

## 2021-05-16 DIAGNOSIS — M109 Gout, unspecified: Secondary | ICD-10-CM

## 2021-05-16 DIAGNOSIS — E1142 Type 2 diabetes mellitus with diabetic polyneuropathy: Secondary | ICD-10-CM | POA: Diagnosis not present

## 2021-05-16 MED ORDER — METOPROLOL TARTRATE 25 MG PO TABS: 12.5000 mg | ORAL_TABLET | Freq: Two times a day (BID) | ORAL | Status: DC

## 2021-05-16 MED ORDER — ALLOPURINOL 100 MG PO TABS: 100.0000 mg | ORAL_TABLET | Freq: Every day | ORAL | Status: DC

## 2021-05-16 MED ORDER — PREDNISONE 10 MG PO TABS: ORAL_TABLET | ORAL | 0 refills | Status: DC

## 2021-05-16 NOTE — Progress Notes (Signed)
This visit occurred during the SARS-CoV-2 public health emergency.  Safety protocols were in place, including screening questions prior to the visit, additional usage of staff PPE, and extensive cleaning of exam room while observing appropriate contact time as indicated for disinfecting solutions. ? ?Diabetes:  ?No meds. ?He cut back on sweets recently.  ?Hypoglycemic episodes: no ?Hyperglycemic episodes:no ?Feet problems: see below.  ?Blood Sugars averaging: ~130 ?eye exam within last year: done about 6 months ago.  [name].  D/w pt about f/u eye exam.   ? ?Gout.  Recently with L sided foot.  He took colchicine initially BID then QD thereafter, for 3 days of total treatment.  He is still on allopurinol at baseline.  No recent typical trigger foods.  Most recent colchicine dose was the day before yesterday.   See notes on labs. ? ?H/o sinusitis.  Done with doxycyline.  Still with some post nasal gtt.  No fevers.  Cough is better.  R ear feels atypical, popping and feels clogged.   ? ?He has been taking 12.5mg  metoprolol BID.  Med list updated.   ? ?Meds, vitals, and allergies reviewed.  ? ?ROS: Per HPI unless specifically indicated in ROS section  ? ?GEN: nad, alert and oriented ?HEENT: ncat, TM wnl except for R ETD.  Sinuses not ttp x4  ?NECK: supple w/o LA ?CV: IRR ?PULM: ctab, no inc wob ?ABD: soft, +bs ?EXT: no edema ?SKIN: well perfused.  ? 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  ?Dec sensation B feet.   ?Dec DP pulses.   ?L 1st toe callous  ? ?33 minutes were devoted to patient care in this encounter (this includes time spent reviewing the patient's file/history, interviewing and examining the patient, counseling/reviewing plan with patient).  ? ?

## 2021-05-16 NOTE — Patient Instructions (Addendum)
Please check on a follow up eye exam and have them send me a copy of the note.   ?Go to the lab on the way out.   If you have mychart we'll likely use that to update you.    ? ?Keep using flonase and nasal saline.   ?Gently try to pop your ears.   ? ?Don't take any more colchicine with this episode.  If you have more redness or pain, then start prednisone with food.  If you don't improve on prednisone, then get rechecked.   ? ?Take care.  Glad to see you. ?

## 2021-05-17 LAB — CBC WITH DIFFERENTIAL/PLATELET
Absolute Monocytes: 638 cells/uL (ref 200–950)
Basophils Absolute: 52 cells/uL (ref 0–200)
Basophils Relative: 0.9 %
Eosinophils Absolute: 220 cells/uL (ref 15–500)
Eosinophils Relative: 3.8 %
HCT: 48.4 % (ref 38.5–50.0)
Hemoglobin: 16.1 g/dL (ref 13.2–17.1)
Lymphs Abs: 1520 cells/uL (ref 850–3900)
MCH: 28.1 pg (ref 27.0–33.0)
MCHC: 33.3 g/dL (ref 32.0–36.0)
MCV: 84.6 fL (ref 80.0–100.0)
Monocytes Relative: 11 %
Neutro Abs: 3370 cells/uL (ref 1500–7800)
Neutrophils Relative %: 58.1 %
RBC: 5.72 10*6/uL (ref 4.20–5.80)
RDW: 14.4 % (ref 11.0–15.0)
Total Lymphocyte: 26.2 %
WBC: 5.8 10*3/uL (ref 3.8–10.8)

## 2021-05-17 LAB — BASIC METABOLIC PANEL
BUN: 19 mg/dL (ref 7–25)
CO2: 25 mmol/L (ref 20–32)
Calcium: 9.9 mg/dL (ref 8.6–10.3)
Chloride: 103 mmol/L (ref 98–110)
Creat: 1.15 mg/dL (ref 0.70–1.22)
Glucose, Bld: 86 mg/dL (ref 65–99)
Potassium: 4.6 mmol/L (ref 3.5–5.3)
Sodium: 141 mmol/L (ref 135–146)

## 2021-05-17 LAB — HEMOGLOBIN A1C
Hgb A1c MFr Bld: 6.8 % of total Hgb — ABNORMAL HIGH (ref <5.7)
Mean Plasma Glucose: 148 mg/dL
eAG (mmol/L): 8.2 mmol/L

## 2021-05-17 LAB — URIC ACID: Uric Acid, Serum: 7.1 mg/dL (ref 4.0–8.0)

## 2021-05-18 NOTE — Assessment & Plan Note (Signed)
Likely residual eustachian tube dysfunction after previous URI.  Discussed options ?Keep using flonase and nasal saline.   ?Gently perform Valsalva and update me as needed. ?

## 2021-05-18 NOTE — Assessment & Plan Note (Signed)
Discussed options. ?Advised not to take any more colchicine with this episode.  If more redness or pain, then start prednisone with food.  If not better on prednisone, then get rechecked.  Steroid cautions discussed with patient.  We talked about options and a brief steroid course if needed may be less risky than continuing colchicine in his situation. ?

## 2021-05-18 NOTE — Assessment & Plan Note (Signed)
Foot care and routine eye exam discussed with patient.  See notes on labs.  Currently not on any medications to lower her sugar.  I will address his A1c when it is resulted. ?

## 2021-05-22 ENCOUNTER — Telehealth: Payer: Self-pay

## 2021-05-22 NOTE — Chronic Care Management (AMB) (Signed)
Transition CCM to Self Care ? ?Patient contacted to inform they have achieved their CCM goals and no longer need to be contacted as frequently. Patient advised services will still be available to them if they would like to reach out or have any new health concerns. Verified patient had contact information to pharmacist and health concierge on hand. Patient made aware CCM services would be continued if desired. Patient consented to cancel future CCM appointments.  ? ?Charlene Brooke, CPP notified ? ?Giara Mcgaughey, CCMA ?Health concierge  ?[phone]  ?

## 2021-06-01 ENCOUNTER — Other Ambulatory Visit: Payer: Self-pay | Admitting: Cardiovascular Disease

## 2021-06-01 DIAGNOSIS — I482 Chronic atrial fibrillation, unspecified: Secondary | ICD-10-CM

## 2021-06-04 DIAGNOSIS — U071 COVID-19: Secondary | ICD-10-CM | POA: Diagnosis not present

## 2021-06-12 ENCOUNTER — Telehealth: Payer: Medicare Other

## 2021-06-16 DIAGNOSIS — N183 Chronic kidney disease, stage 3 unspecified: Secondary | ICD-10-CM | POA: Diagnosis not present

## 2021-06-17 ENCOUNTER — Ambulatory Visit (INDEPENDENT_AMBULATORY_CARE_PROVIDER_SITE_OTHER): Payer: Medicare Other

## 2021-06-17 DIAGNOSIS — E538 Deficiency of other specified B group vitamins: Secondary | ICD-10-CM

## 2021-06-17 MED ORDER — CYANOCOBALAMIN 1000 MCG/ML IJ SOLN
1000.0000 ug | Freq: Once | INTRAMUSCULAR | Status: AC
  Administered 2021-06-17: 1000 ug via INTRAMUSCULAR

## 2021-06-17 NOTE — Progress Notes (Signed)
Per orders of Dr. Duncan, monthly injection of B12 given by Obrien Huskins G [facility]. Patient tolerated injection well.  

## 2021-06-23 ENCOUNTER — Ambulatory Visit (INDEPENDENT_AMBULATORY_CARE_PROVIDER_SITE_OTHER): Payer: Medicare Other

## 2021-06-23 DIAGNOSIS — I4819 Other persistent atrial fibrillation: Secondary | ICD-10-CM

## 2021-06-23 DIAGNOSIS — Z5181 Encounter for therapeutic drug level monitoring: Secondary | ICD-10-CM | POA: Diagnosis not present

## 2021-06-23 LAB — POCT INR: INR: 2.4 (ref 2.0–3.0)

## 2021-06-23 NOTE — Patient Instructions (Signed)
Description   ?- Continue on same dosage of Warfarin 1 tablet daily except 1.5 tablets on Sundays and Thursdays.  ?- Recheck INR in 6 weeks.  ?Call Coumadin clinic for any changes in medications or up coming procedures. [phone].  ? ?  ?   ?

## 2021-06-26 DIAGNOSIS — N2581 Secondary hyperparathyroidism of renal origin: Secondary | ICD-10-CM | POA: Diagnosis not present

## 2021-06-26 DIAGNOSIS — I129 Hypertensive chronic kidney disease with stage 1 through stage 4 chronic kidney disease, or unspecified chronic kidney disease: Secondary | ICD-10-CM | POA: Diagnosis not present

## 2021-06-26 DIAGNOSIS — D631 Anemia in chronic kidney disease: Secondary | ICD-10-CM | POA: Diagnosis not present

## 2021-06-26 DIAGNOSIS — N183 Chronic kidney disease, stage 3 unspecified: Secondary | ICD-10-CM | POA: Diagnosis not present

## 2021-07-01 DIAGNOSIS — Z20822 Contact with and (suspected) exposure to covid-19: Secondary | ICD-10-CM | POA: Diagnosis not present

## 2021-07-17 ENCOUNTER — Ambulatory Visit: Payer: Medicare Other

## 2021-07-17 ENCOUNTER — Ambulatory Visit (INDEPENDENT_AMBULATORY_CARE_PROVIDER_SITE_OTHER): Payer: Medicare Other | Admitting: Nurse Practitioner

## 2021-07-17 VITALS — BP 136/72 | HR 78 | Temp 97.5°F | Wt 212.0 lb

## 2021-07-17 DIAGNOSIS — R6889 Other general symptoms and signs: Secondary | ICD-10-CM | POA: Diagnosis not present

## 2021-07-17 DIAGNOSIS — R0982 Postnasal drip: Secondary | ICD-10-CM | POA: Diagnosis not present

## 2021-07-17 DIAGNOSIS — N1832 Chronic kidney disease, stage 3b: Secondary | ICD-10-CM

## 2021-07-17 LAB — IBC + FERRITIN
Ferritin: 190.4 ng/mL (ref 22.0–322.0)
Iron: 80 ug/dL (ref 42–165)
Saturation Ratios: 24.2 % (ref 20.0–50.0)
TIBC: 330.4 ug/dL (ref 250.0–450.0)
Transferrin: 236 mg/dL (ref 212.0–360.0)

## 2021-07-17 MED ORDER — IPRATROPIUM BROMIDE 0.03 % NA SOLN: 2.0000 | Freq: Two times a day (BID) | NASAL | 12 refills | Status: DC

## 2021-07-17 NOTE — Assessment & Plan Note (Signed)
Recently checked labs CBC within normal limits per patient request like to check iron level ?

## 2021-07-17 NOTE — Patient Instructions (Signed)
Nice to see you today ?Follow up if no improvement ?I will be in touch with the lab results once I have them ?

## 2021-07-17 NOTE — Progress Notes (Signed)
? ?Acute Office Visit ? ?Subjective:  ? ?  ?Patient ID: [name], male    DOB: 08/18/1930, 88 y.o.   MRN: [id number] ? ?Chief Complaint  ?Patient presents with  ? Shortness of Breath  ?  Just yesterday morning, he got choked by a lot of mucous   ? Cough  ?  Productive with green mucous   ? ? ? ?Patient is in today for  ? ?Shortness of breath: started yesterday morning and he could not breath. States finally he coughed up a green mucous plug. States that with walking it made it worse. Then had some coffee and t was able to cough up mucous plug and breathing is went back to normal since patient does complain that he is continuing having what sounds to be postnasal drip and coughing up white to clear phlegm.  Patient patient's family did state that yesterday it did turn green.  He has been using Flonase every other day without great relief. ? ?Also concerned that he has a cold feeling all the time but it feels like it is more on the "inside". ? ?Review of Systems  ?Constitutional:  Negative for chills and fever.  ?     Staying cold  ?HENT:  Positive for sinus pain. Negative for ear discharge, ear pain and sore throat.   ?     PND positive  ?Respiratory:  Positive for cough (clear white mucous yesterday it was good) and shortness of breath.   ?Gastrointestinal:  Negative for diarrhea, nausea and vomiting.  ?Musculoskeletal:  Negative for joint pain and myalgias.  ?Neurological:  Positive for dizziness (lay on the left side). Negative for headaches.  ? ? ?   ?Objective:  ?  ?BP 136/72   Pulse 78   Temp (!) 97.5 ?F (36.4 ?C) (Temporal)   Wt 212 lb (96.2 kg)   SpO2 99%   BMI 34.22 kg/m?  ?BP Readings from Last 3 Encounters:  ?07/17/21 136/72  ?05/16/21 124/72  ?05/08/21 120/62  ? ?Wt Readings from Last 3 Encounters:  ?07/17/21 212 lb (96.2 kg)  ?05/16/21 213 lb (96.6 kg)  ?05/08/21 213 lb 5 oz (96.8 kg)  ? ?  ? ?Physical Exam ?Constitutional:   ?   Appearance: He is well-developed.  ?HENT:  ?   Right Ear:  Tympanic membrane, ear canal and external ear normal.  ?   Left Ear: Tympanic membrane, ear canal and external ear normal.  ?   Mouth/Throat:  ?   Mouth: Mucous membranes are moist.  ?   Pharynx: Oropharynx is clear.  ?Cardiovascular:  ?   Rate and Rhythm: Regular rhythm.  ?   Heart sounds: Normal heart sounds.  ?Pulmonary:  ?   Breath sounds: Normal breath sounds.  ?Abdominal:  ?   General: Bowel sounds are normal.  ?Neurological:  ?   Mental Status: He is alert.  ? ? ?No results found for any visits on 07/17/21. ? ? ?   ?Assessment & Plan:  ? ?Problem List Items Addressed This Visit   ? ?  ? Genitourinary  ? CKD (chronic kidney disease), stage III ([place])  ?  Recently checked labs CBC within normal limits per patient request like to check iron level ? ?  ?  ? Relevant Orders  ? IBC + Ferritin  ?  ? Other  ? Sensation of feeling cold - Primary  ?  Complaining of feeling cold on the inside all the time.  Patient's family was curious  if patient to be anemic.  Patient's last CBC within normal limits.  Did request that we draw iron.  Patient did sign financial waiver if insurance does not cover they will pick up the bill financially. ? ?  ?  ? Relevant Orders  ? IBC + Ferritin  ? PND (post-nasal drip)  ?  Has been using over-the-counter Flonase without great relief.  We will send in some ipratropium nasal spray for him to trial over the next week.  If symptoms fail to improve or get worse daughter will reach out to the office. ? ?  ?  ? Relevant Medications  ? ipratropium (ATROVENT) 0.03 % nasal spray  ? ? ?Meds ordered this encounter  ?Medications  ? ipratropium (ATROVENT) 0.03 % nasal spray  ?  Sig: Place 2 sprays into both nostrils every 12 (twelve) hours.  ?  Dispense:  30 mL  ?  Refill:  12  ?  Order Specific Question:   Supervising Provider  ?  Answer:   Loura Pardon A [[id number]]  ? ? ?Return if symptoms worsen or fail to improve. ? ?Romilda Garret, NP ? ? ?

## 2021-07-17 NOTE — Assessment & Plan Note (Signed)
Complaining of feeling cold on the inside all the time.  Patient's family was curious if patient to be anemic.  Patient's last CBC within normal limits.  Did request that we draw iron.  Patient did sign financial waiver if insurance does not cover they will pick up the bill financially. ?

## 2021-07-17 NOTE — Assessment & Plan Note (Signed)
Has been using over-the-counter Flonase without great relief.  We will send in some ipratropium nasal spray for him to trial over the next week.  If symptoms fail to improve or get worse daughter will reach out to the office. ?

## 2021-07-25 ENCOUNTER — Ambulatory Visit (INDEPENDENT_AMBULATORY_CARE_PROVIDER_SITE_OTHER): Payer: Medicare Other | Admitting: Family Medicine

## 2021-07-25 ENCOUNTER — Telehealth: Payer: Self-pay | Admitting: *Deleted

## 2021-07-25 ENCOUNTER — Encounter: Payer: Self-pay | Admitting: Family Medicine

## 2021-07-25 VITALS — BP 122/76 | HR 70 | Temp 97.6°F | Ht 66.0 in | Wt 213.0 lb

## 2021-07-25 DIAGNOSIS — J329 Chronic sinusitis, unspecified: Secondary | ICD-10-CM | POA: Diagnosis not present

## 2021-07-25 DIAGNOSIS — E538 Deficiency of other specified B group vitamins: Secondary | ICD-10-CM

## 2021-07-25 MED ORDER — DOXYCYCLINE HYCLATE 100 MG PO TABS: 100.0000 mg | ORAL_TABLET | Freq: Two times a day (BID) | ORAL | 0 refills | Status: DC

## 2021-07-25 MED ORDER — CYANOCOBALAMIN 1000 MCG/ML IJ SOLN
1000.0000 ug | Freq: Once | INTRAMUSCULAR | Status: AC
  Administered 2021-07-25: 1000 ug via INTRAMUSCULAR

## 2021-07-25 NOTE — Progress Notes (Signed)
Due for B12 shot today, d/w pt. done at office visit. ? ?URI sx.  Didn't tolerate ipratropium.  Had used flonase until he started ipratropium. Not green discharge but having white post nasal gtt.  No fevers, no chills.  Prev cold feeling "inside" is better.  Frontal pain.  Still on coumadin.   ? ?Meds, vitals, and allergies reviewed.  ? ?ROS: Per HPI unless specifically indicated in ROS section  ? ?Nad ?ncat ?TM wnl B ?R max sinus ttp.  ?MMM, OP wnl,  ?Neck supple, no LA ?ctab ?IRR.   SEM noted.  ?No BLE edema.   ?

## 2021-07-25 NOTE — Telephone Encounter (Signed)
Received a voicemail message from [place] at Dr. Josefine Class office. She stated the pt will start doxy. Called pt since the med interacts and let him know that as well. He will start doxycycline '100mg'$  bid x 7days per rx in chart. He stated he would come in for an appt and set it for Tuesday at 1030am and he will has his dtr bring him. He is aware that the INR can increase with the antibiotic.  ? ?

## 2021-07-25 NOTE — Patient Instructions (Signed)
We'll call the coumadin clinic but check with them in the meantime also.  ?Start doxycycline.  ?Update me if not better.   ?Keep using flonase.  ?Take care.  Glad to see you. ?

## 2021-07-27 DIAGNOSIS — J329 Chronic sinusitis, unspecified: Secondary | ICD-10-CM | POA: Insufficient documentation

## 2021-07-27 NOTE — Assessment & Plan Note (Signed)
Dose given at office visit. 

## 2021-07-27 NOTE — Assessment & Plan Note (Signed)
We'll call the coumadin clinic but I asked patient also check with them in the meantime.  ?Start doxycycline.  Routine cautions given to patient. ?Update me if not better.   ?Keep using flonase. If not better then we can refer to ENT.  D/w pt.   ?Would stop using ipratropium. ?

## 2021-07-29 ENCOUNTER — Ambulatory Visit (INDEPENDENT_AMBULATORY_CARE_PROVIDER_SITE_OTHER): Payer: Medicare Other | Admitting: *Deleted

## 2021-07-29 DIAGNOSIS — I4819 Other persistent atrial fibrillation: Secondary | ICD-10-CM | POA: Diagnosis not present

## 2021-07-29 DIAGNOSIS — Z5181 Encounter for therapeutic drug level monitoring: Secondary | ICD-10-CM | POA: Diagnosis not present

## 2021-07-29 LAB — POCT INR: INR: 2.1 (ref 2.0–3.0)

## 2021-07-29 NOTE — Patient Instructions (Addendum)
Description   Continue taking Warfarin 1 tablet daily except 1.5 tablets on Sundays and Thursdays. Recheck INR in 6 weeks. Call Coumadin clinic for any changes in medications or up coming procedures. [phone].       

## 2021-08-18 ENCOUNTER — Ambulatory Visit (INDEPENDENT_AMBULATORY_CARE_PROVIDER_SITE_OTHER): Payer: Medicare Other | Admitting: Cardiovascular Disease

## 2021-08-18 ENCOUNTER — Encounter: Payer: Self-pay | Admitting: Cardiovascular Disease

## 2021-08-18 ENCOUNTER — Ambulatory Visit (HOSPITAL_COMMUNITY): Payer: Medicare Other | Attending: Cardiovascular Disease

## 2021-08-18 VITALS — BP 122/80 | HR 86 | Ht 66.0 in | Wt 209.4 lb

## 2021-08-18 DIAGNOSIS — I2510 Atherosclerotic heart disease of native coronary artery without angina pectoris: Secondary | ICD-10-CM

## 2021-08-18 DIAGNOSIS — I350 Nonrheumatic aortic (valve) stenosis: Secondary | ICD-10-CM | POA: Diagnosis not present

## 2021-08-18 DIAGNOSIS — I4819 Other persistent atrial fibrillation: Secondary | ICD-10-CM

## 2021-08-18 LAB — ECHOCARDIOGRAM COMPLETE
AR max vel: 0.75 cm2
AV Area VTI: 0.67 cm2
AV Area mean vel: 0.71 cm2
AV Mean grad: 37 mmHg
AV Peak grad: 66.3 mmHg
Ao pk vel: 4.07 m/s
Area-P 1/2: 2.87 cm2
S' Lateral: 3.3 cm

## 2021-08-18 NOTE — Patient Instructions (Signed)
Medication Instructions:  Your physician recommends that you continue on your current medications as directed. Please refer to the Current Medication list given to you today.  *If you need a refill on your cardiac medications before your next appointment, please call your pharmacy*   Lab Work: NONE If you have labs (blood work) drawn today and your tests are completely normal, you will receive your results only by: [place] (if you have MyChart) OR A paper copy in the mail If you have any lab test that is abnormal or we need to change your treatment, we will call you to review the results.   Testing/Procedures: ECHO (prior to next appt) Your physician has requested that you have an echocardiogram. Echocardiography is a painless test that uses sound waves to create images of your heart. It provides your doctor with information about the size and shape of your heart and how well your heart's chambers and valves are working. This procedure takes approximately one hour. There are no restrictions for this procedure.  Follow-Up: At [facility], you and your health needs are our priority.  As part of our continuing mission to provide you with exceptional heart care, we have created designated Provider Care Teams.  These Care Teams include your primary Cardiologist (physician) and Advanced Practice Providers (APPs -  Physician Assistants and Nurse Practitioners) who all work together to provide you with the care you need, when you need it.  Your next appointment:   6 month(s)  The format for your next appointment:   In Person  Provider:   Sherren Mocha, MD    Important Information About Sugar

## 2021-08-18 NOTE — Progress Notes (Unsigned)
Cardiology Office Note:    Date:  08/18/2021   ID:  [name], DOB December 20, 1930, MRN [id number]  PCP:  Tonia Ghent, MD   [facility] Providers Cardiologist:  Sherren Mocha, MD     Referring MD: Tonia Ghent, MD   Chief Complaint  Patient presents with   Atrial Fibrillation    History of Present Illness:    [name] is a 88 y.o. male with a hx of coronary artery disease and permanent atrial fibrillation, presenting for follow-up evaluation.  He was last seen here 1 year ago for routine cardiology follow-up.  He has developed elevated creatinine when I saw him last year.  His ACE inhibitor was discontinued as his creatinine was in excess of 2 mg/dL.  The most recent creatinine I have on file now is from May 16, 2021 which showed marked improvement in creatinine at 1.15.  The patient is here with his daughter-in-law today.  He admits to mild shortness of breath with activity but has not appreciated any change over the past year.  His primary limitation with his breathing is when he bends forward to tie his shoes.  He otherwise has no significant limitation.  States that he has some problems with his balance after having a few toes amputated from complications of gout.  He denies chest pain, chest pressure, heart palpitations, lightheadedness, or syncope.  Past Medical History:  Diagnosis Date   Aortic stenosis    Arthritis    Bradycardia    a. nocturnal with pauses overnight on tele likely due to OSA 10/2015.   Cancer ([place])    skin cancer- burned off   CKD (chronic kidney disease), stage III (HCC)    Coronary artery disease    a. prev nonobst. b. LHC 10/24/15: occlusion of small diffusely diseased OM2 s/p balloon angioplasty, mild nonobstructive disease of mLAD and mRCA, normal LVEDP   Diabetes mellitus without complication ([place])    54/65/68 Pt's daughter states patient does not  have diabetes.  A1C 07/23/20 was 7.2, patientr had been on medication.   Diabetic  peripheral neuropathy ([place]) 10/04/2019   Diverticulosis    Esophageal reflux    Esophageal stricture from GERD 06/08/2011   With stricture at GE junction on EGD, dilated 06/2011    Gastric ulcer    on EGD 2013   Gout, unspecified    "on daily RX" (09/09/2017)   History of blood transfusion 02/1964   "when I had right little finger cut off"   Hyperlipidemia    Hypertension    Obesity, unspecified    Orthopnea    OSA on CPAP    Paroxysmal nocturnal dyspnea    Persistent atrial fibrillation ([place])    Pneumonia    Vertigo     Past Surgical History:  Procedure Laterality Date   AMPUTATION Right 03/03/2019   Procedure: RIGHT 2ND TOE AMPUTATION;  Surgeon: [name], MD;  Location: [place];  Service: Orthopedics;  Laterality: Right;   AMPUTATION Right 01/03/2021   Procedure: RIGHT 3RD TOE AMPUTATION;  Surgeon: [name], MD;  Location: [place];  Service: Orthopedics;  Laterality: Right;   BALLOON DILATION N/A 10/22/2014   Procedure: BALLOON DILATION;  Surgeon: [name], MD;  Location: [facility];  Service: Endoscopy;  Laterality: N/A;   CARDIAC CATHETERIZATION  07/2008   CARDIAC CATHETERIZATION N/A 10/24/2015   Procedure: Left Heart Cath and Coronary Angiography;  Surgeon: [name], MD;  Location: [place] [facility];  Service: Cardiovascular;  Laterality: N/A;   CARDIAC CATHETERIZATION N/A 10/24/2015   Procedure: Coronary Balloon Angioplasty;  Surgeon: [name], MD;  Location: [place] [facility];  Service: Cardiovascular;  Laterality: N/A;   Carotid Dopplers  09/2009   no sig extracranial stenosis and vertebral arteries had antegrade flow   CATARACT EXTRACTION W/ INTRAOCULAR LENS  IMPLANT, BILATERAL Bilateral    ESOPHAGOGASTRODUODENOSCOPY (EGD) WITH ESOPHAGEAL DILATION     "I've had it stretched 3 times" (10/23/2015)   ESOPHAGOGASTRODUODENOSCOPY (EGD) WITH ESOPHAGEAL DILATION  06/2011   /medical hx above  (09/09/2017)   ESOPHAGOGASTRODUODENOSCOPY (EGD) WITH PROPOFOL  N/A 10/22/2014   Procedure: ESOPHAGOGASTRODUODENOSCOPY (EGD) WITH PROPOFOL;  Surgeon: [name], MD;  [facility]: [facility];  Service: Endoscopy;  Laterality: N/A;   FINGER SURGERY Right 02/1964   "cut little finger off; had it reattached"   I & D EXTREMITY Right 09/10/2017   Procedure: RIGHT FOOT DEBRIDEMENT;  Surgeon: [name], MD;  Location: [place];  Service: Orthopedics;  Laterality: Right;   MRI of brain  09/2009   chronic microvascular ischemia   OSA Sleep Study     per Dr. Brett Fairy   SHOULDER ARTHROSCOPY W/ ROTATOR CUFF REPAIR Right 2008    Current Medications: Current Meds  Medication Sig   acetaminophen (TYLENOL) 500 MG tablet Take 1,000 mg by mouth every 8 (eight) hours as needed for mild pain.   allopurinol (ZYLOPRIM) 100 MG tablet Take 1 tablet (100 mg total) by mouth daily.   cyanocobalamin (,VITAMIN B-12,) 1000 MCG/ML injection 1041mg IM every 28 days   diltiazem (CARDIZEM CD) 180 MG 24 hr capsule TAKE 1 CAPSULE BY MOUTH EVERY DAY   doxycycline (VIBRA-TABS) 100 MG tablet Take 1 tablet (100 mg total) by mouth 2 (two) times daily.   fluticasone (FLONASE) 50 MCG/ACT nasal spray Place 2 sprays into both nostrils daily. (Patient taking differently: Place 2 sprays into both nostrils as needed.)   furosemide (LASIX) 20 MG tablet TAKE 1 TABLET BY MOUTH DAILY. PLEASE MAKE YEARLY APPT WITH DR. CBurt KnackFOR MAY BEFORE ANYMORE REFILLS   lisinopril (ZESTRIL) 10 MG tablet Take 1 tablet (10 mg total) by mouth daily. (Patient taking differently: Take 10 mg by mouth in the morning and at bedtime. Per patient taking 1/2 tablet twice a day)   metoprolol tartrate (LOPRESSOR) 25 MG tablet Take 0.5 tablets (12.5 mg total) by mouth 2 (two) times daily.   omeprazole (PRILOSEC) 40 MG capsule TAKE 1 CAPSULE BY MOUTH EVERY DAY   sodium chloride (OCEAN) 0.65 % SOLN nasal spray Place 1 spray into both nostrils as needed for congestion.   tamsulosin (FLOMAX) 0.4 MG CAPS capsule Take 1 capsule (0.4  mg total) by mouth daily.   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 Tobacco Use   Smoking status: Former    Packs/day: 0.12    Years: 4.00    Pack years: 0.48    Types: Cigarettes  Quit date: 03/16/1969    Years since quitting: 52.4   Smokeless tobacco: Former   Tobacco comments:    "chewed when I smoked; smoked 1 pack/week for about 4 years, quit 1971  Vaping Use   Vaping Use: Never used  Substance and Sexual Activity   Alcohol use: Not Currently    Alcohol/week: 0.0 standard drinks    Comment: 09/09/2017  "Quit all alcohol in 1970's"   Drug use: Never   Sexual activity: Not on file  Other Topics Concern   Not on file  Social History Narrative   Lives in [place], married 1953, widowed after 73 years   Retired Administrator, still does Biomedical scientist.   He is not routinely exercising.   Daily caffeine    Social Determinants of Health   Financial Resource Strain: Low Risk    Difficulty of Paying Living Expenses: Not very hard  Food Insecurity: No Food Insecurity   Worried About Charity fundraiser in the Last Year: Never true   Ran Out of Food in the Last Year: Never true  Transportation Needs: No Transportation Needs   Lack of Transportation (Medical): No   Lack of Transportation (Non-Medical): No  Physical Activity: Insufficiently Active   Days of Exercise per Week: 7 days   Minutes of Exercise per Session: 20 min  Stress: No Stress  Concern Present   Feeling of Stress : Not at all  Social Connections: Moderately Integrated   Frequency of Communication with Friends and Family: Twice a week   Frequency of Social Gatherings with Friends and Family: Once a week   Attends Religious Services: 1 to 4 times per year   Active Member of [employer] or Organizations: No   Attends Archivist Meetings: 1 to 4 times per year   Marital Status: Widowed     Family History: The patient's family history includes Dementia in his father; Heart disease in his brother; Hypertension in his father and another family member; Pneumonia in his father; Sleep apnea in his daughter; Stomach cancer in his mother. There is no history of Prostate cancer or Colon cancer.  ROS:   Please see the history of present illness.    All other systems reviewed and are negative.  EKGs/Labs/Other Studies Reviewed:    The following studies were reviewed today: Today's 2D echo was personally reviewed.  It shows vigorous/hyperdynamic left ventricular systolic function, heavy mitral annular calcification, severe aortic calcification with a peak transaortic velocity of 407 cm/s, peak and mean gradients of 66 and 37 mmHg, dimensionless index of 0.27.  EKG:  EKG is ordered today.  The ekg ordered today demonstrates atrial fibrillation 86 bpm, possible age-indeterminate inferior infarct.  Recent Labs: 09/17/2020: ALT 24 05/16/2021: BUN 19; Creat 1.15; Hemoglobin 16.1; Platelets CANCELED; Potassium 4.6; Sodium 141  Recent Lipid Panel    Component Value Date/Time   CHOL 166 05/16/2019 0923   TRIG 279.0 (H) 05/16/2019 0923   HDL 26.40 (L) 05/16/2019 0923   CHOLHDL 6 05/16/2019 0923   VLDL 55.8 (H) 05/16/2019 0923   LDLCALC 82 10/24/2015 0430   LDLDIRECT 95.0 05/16/2019 0923     Risk Assessment/Calculations:    CHA2DS2-VASc Score = 6  {Confirm score is correct.  If not, click here to update score.  REFRESH note.  :1} This indicates a 9.7% annual risk of  stroke. The patient's score is based upon: CHF History: 1 HTN History: 1 Diabetes History: 1 Stroke History: 0 Vascular Disease History: 1 Age Score: 2  Gender Score: 0   {This patient has a significant risk of stroke if diagnosed with atrial fibrillation.  Please consider VKA or DOAC agent for anticoagulation if the bleeding risk is acceptable.   You can also use the SmartPhrase .[place] for documentation.   :[id number]}       Physical Exam:    VS:  BP 122/80   Pulse 86   Ht '5\' 6"'$  (1.676 m)   Wt 209 lb 6.4 oz (95 kg)   SpO2 96%   BMI 33.80 kg/m     Wt Readings from Last 3 Encounters:  08/18/21 209 lb 6.4 oz (95 kg)  07/25/21 213 lb (96.6 kg)  07/17/21 212 lb (96.2 kg)     GEN:  Well nourished, well developed in no acute distress HEENT: Normal NECK: No JVD; No carotid bruits LYMPHATICS: No lymphadenopathy CARDIAC: Irregularly irregular with a grade 3/6 harsh late peaking systolic murmur at the right upper sternal border RESPIRATORY:  Clear to auscultation without rales, wheezing or rhonchi  ABDOMEN: Soft, non-tender, prominent abdomen MUSCULOSKELETAL:  No edema; No deformity  SKIN: Warm and dry NEUROLOGIC:  Alert and oriented x 3 PSYCHIATRIC:  Normal affect   ASSESSMENT:    1. Persistent atrial fibrillation ([place])   2. Moderate aortic stenosis   3. Coronary artery disease involving native coronary artery of native heart without angina pectoris    PLAN:    In order of problems listed above:  Clinically stable.  Continue anticoagulation with warfarin.  No bleeding problems reported. I personally reviewed the patient's echo images today.  I think his aortic stenosis has progressed significantly.  I reviewed the findings as outlined above and will await the formal report.  I think he also has very hyperdynamic LV function probably contributing to the increased gradient, but there is a significant change from his echo 1 year ago.  Despite this, he reports no  symptomatic change.  I will see him back in 6 months with a follow-up echocardiogram prior to that visit.  I reviewed the natural history of aortic stenosis with the patient, discussed potential treatment options, and alerted him to cardinal symptoms of progressive dyspnea, fatigue, chest pain or pressure, lightheadedness, or syncope. Stable without symptoms of angina.  He will continue on his current medical program.           Medication Adjustments/Labs and Tests Ordered: Current medicines are reviewed at length with the patient today.  Concerns regarding medicines are outlined above.  Orders Placed This Encounter  Procedures   EKG 12-Lead   ECHOCARDIOGRAM COMPLETE   No orders of the defined types were placed in this encounter.   Patient Instructions  Medication Instructions:  Your physician recommends that you continue on your current medications as directed. Please refer to the Current Medication list given to you today.  *If you need a refill on your cardiac medications before your next appointment, please call your pharmacy*   Lab Work: NONE If you have labs (blood work) drawn today and your tests are completely normal, you will receive your results only by: Organ (if you have MyChart) OR A paper copy in the mail If you have any lab test that is abnormal or we need to change your treatment, we will call you to review the results.   Testing/Procedures: ECHO (prior to next appt) Your physician has requested that you have an echocardiogram. Echocardiography is a painless test that uses sound waves to create images of your heart. It provides your  doctor with information about the size and shape of your heart and how well your heart's chambers and valves are working. This procedure takes approximately one hour. There are no restrictions for this procedure.  Follow-Up: At [facility], you and your health needs are our priority.  As part of our continuing mission to  provide you with exceptional heart care, we have created designated Provider Care Teams.  These Care Teams include your primary Cardiologist (physician) and Advanced Practice Providers (APPs -  Physician Assistants and Nurse Practitioners) who all work together to provide you with the care you need, when you need it.  Your next appointment:   6 month(s)  The format for your next appointment:   In Person  Provider:   Sherren Mocha, MD    Important Information About Sugar         Signed, Sherren Mocha, MD  08/18/2021 2:45 PM    [facility]

## 2021-08-21 ENCOUNTER — Ambulatory Visit: Payer: Medicare Other

## 2021-08-28 ENCOUNTER — Ambulatory Visit: Payer: Medicare Other

## 2021-08-28 ENCOUNTER — Ambulatory Visit (INDEPENDENT_AMBULATORY_CARE_PROVIDER_SITE_OTHER): Payer: Medicare Other

## 2021-08-28 DIAGNOSIS — E538 Deficiency of other specified B group vitamins: Secondary | ICD-10-CM

## 2021-08-28 MED ORDER — CYANOCOBALAMIN 1000 MCG/ML IJ SOLN
1000.0000 ug | Freq: Once | INTRAMUSCULAR | Status: AC
  Administered 2021-08-28: 1000 ug via INTRAMUSCULAR

## 2021-08-28 NOTE — Progress Notes (Signed)
Per orders of Dr. Duncan, injection of vit B12 given by Eliah Marquard. Patient tolerated injection well.  

## 2021-09-07 ENCOUNTER — Other Ambulatory Visit: Payer: Self-pay | Admitting: Family Medicine

## 2021-09-07 DIAGNOSIS — R1319 Other dysphagia: Secondary | ICD-10-CM

## 2021-09-07 DIAGNOSIS — K222 Esophageal obstruction: Secondary | ICD-10-CM

## 2021-09-10 DIAGNOSIS — M1712 Unilateral primary osteoarthritis, left knee: Secondary | ICD-10-CM | POA: Diagnosis not present

## 2021-09-17 ENCOUNTER — Other Ambulatory Visit: Payer: Self-pay | Admitting: Family Medicine

## 2021-09-17 ENCOUNTER — Other Ambulatory Visit: Payer: Self-pay | Admitting: Cardiovascular Disease

## 2021-09-18 ENCOUNTER — Ambulatory Visit (INDEPENDENT_AMBULATORY_CARE_PROVIDER_SITE_OTHER): Payer: Medicare Other | Admitting: *Deleted

## 2021-09-18 DIAGNOSIS — I4819 Other persistent atrial fibrillation: Secondary | ICD-10-CM

## 2021-09-18 DIAGNOSIS — Z5181 Encounter for therapeutic drug level monitoring: Secondary | ICD-10-CM | POA: Diagnosis not present

## 2021-09-18 LAB — POCT INR: INR: 2 (ref 2.0–3.0)

## 2021-09-18 NOTE — Patient Instructions (Addendum)
Description   Today take another 1/2 tablet then continue taking Warfarin 1 tablet daily except 1.5 tablets on Sundays and Thursdays. Recheck INR in 6 weeks. Call Coumadin clinic for any changes in medications or up coming procedures. [phone].

## 2021-09-30 ENCOUNTER — Ambulatory Visit (INDEPENDENT_AMBULATORY_CARE_PROVIDER_SITE_OTHER): Payer: Medicare Other | Admitting: *Deleted

## 2021-09-30 DIAGNOSIS — E538 Deficiency of other specified B group vitamins: Secondary | ICD-10-CM

## 2021-09-30 MED ORDER — CYANOCOBALAMIN 1000 MCG/ML IJ SOLN
1000.0000 ug | Freq: Once | INTRAMUSCULAR | Status: AC
  Administered 2021-09-30: 1000 ug via INTRAMUSCULAR

## 2021-09-30 NOTE — Progress Notes (Signed)
Per orders of Dr. Damita Dunnings, injection of Vitamin B12 in right deltoid given by Lauralyn Primes. Patient tolerated injection well.

## 2021-10-17 ENCOUNTER — Encounter: Payer: Self-pay | Admitting: Podiatry

## 2021-10-17 ENCOUNTER — Ambulatory Visit (INDEPENDENT_AMBULATORY_CARE_PROVIDER_SITE_OTHER): Payer: Medicare Other | Admitting: Podiatry

## 2021-10-17 DIAGNOSIS — M79675 Pain in left toe(s): Secondary | ICD-10-CM | POA: Diagnosis not present

## 2021-10-17 DIAGNOSIS — M79674 Pain in right toe(s): Secondary | ICD-10-CM | POA: Diagnosis not present

## 2021-10-17 DIAGNOSIS — B351 Tinea unguium: Secondary | ICD-10-CM

## 2021-10-20 ENCOUNTER — Other Ambulatory Visit: Payer: Self-pay | Admitting: Cardiovascular Disease

## 2021-10-24 DIAGNOSIS — J0100 Acute maxillary sinusitis, unspecified: Secondary | ICD-10-CM | POA: Diagnosis not present

## 2021-10-24 NOTE — Progress Notes (Signed)
  Subjective:  Patient ID: [name], male    DOB: 01-Jan-1931,  MRN: [id number]  Chief Complaint  Patient presents with   Nail Problem   88 y.o. male returns for the above complaint.  Patient presents with thickened elongated dystrophic toenails x 10 mild pain on palpation.  Patient states painful to touch.  He would like to have it debrided down.  He has not seen anyone else prior to seeing me.  Objective:  There were no vitals filed for this visit. Podiatric Exam: Vascular: dorsalis pedis and posterior tibial pulses are palpable bilateral. Capillary return is immediate. Temperature gradient is WNL. Skin turgor WNL  Sensorium: Normal Semmes Weinstein monofilament test. Normal tactile sensation bilaterally. Nail Exam: Pt has thick disfigured discolored nails with subungual debris noted bilateral entire nail hallux through fifth toenails.  Pain on palpation to the nails. Ulcer Exam: There is no evidence of ulcer or pre-ulcerative changes or infection. Orthopedic Exam: Muscle tone and strength are WNL. No limitations in general ROM. No crepitus or effusions noted.  Skin: No Porokeratosis. No infection or ulcers    Assessment & Plan:   1. Pain due to onychomycosis of toenails of both feet     Patient was evaluated and treated and all questions answered.  Onychomycosis with pain  -Nails palliatively debrided as below. -Educated on self-care  Procedure: Nail Debridement Rationale: pain  Type of Debridement: manual, sharp debridement. Instrumentation: Nail nipper, rotary burr. Number of Nails: 10  Procedures and Treatment: Consent by patient was obtained for treatment procedures. The patient understood the discussion of treatment and procedures well. All questions were answered thoroughly reviewed. Debridement of mycotic and hypertrophic toenails, 1 through 5 bilateral and clearing of subungual debris. No ulceration, no infection noted.  Return Visit-Office Procedure: Patient  instructed to return to the office for a follow up visit 3 months for continued evaluation and treatment.  [name], DPM    Return in about 3 months (around 01/17/2022).

## 2021-10-30 ENCOUNTER — Ambulatory Visit (INDEPENDENT_AMBULATORY_CARE_PROVIDER_SITE_OTHER): Payer: Medicare Other | Admitting: *Deleted

## 2021-10-30 DIAGNOSIS — I4819 Other persistent atrial fibrillation: Secondary | ICD-10-CM | POA: Diagnosis not present

## 2021-10-30 DIAGNOSIS — Z5181 Encounter for therapeutic drug level monitoring: Secondary | ICD-10-CM | POA: Diagnosis not present

## 2021-10-30 LAB — POCT INR: INR: 2.1 (ref 2.0–3.0)

## 2021-10-30 NOTE — Patient Instructions (Signed)
Description   Continue taking Warfarin 1 tablet daily except 1.5 tablets on Sundays and Thursdays. Recheck INR in 6 weeks. Call Coumadin clinic for any changes in medications or up coming procedures. [phone].

## 2021-11-03 DIAGNOSIS — H1045 Other chronic allergic conjunctivitis: Secondary | ICD-10-CM | POA: Diagnosis not present

## 2021-11-03 DIAGNOSIS — H52223 Regular astigmatism, bilateral: Secondary | ICD-10-CM | POA: Diagnosis not present

## 2021-11-03 DIAGNOSIS — Z961 Presence of intraocular lens: Secondary | ICD-10-CM | POA: Diagnosis not present

## 2021-11-03 DIAGNOSIS — Z9849 Cataract extraction status, unspecified eye: Secondary | ICD-10-CM | POA: Diagnosis not present

## 2021-11-03 DIAGNOSIS — H5203 Hypermetropia, bilateral: Secondary | ICD-10-CM | POA: Diagnosis not present

## 2021-11-03 DIAGNOSIS — H3562 Retinal hemorrhage, left eye: Secondary | ICD-10-CM | POA: Diagnosis not present

## 2021-11-03 DIAGNOSIS — H524 Presbyopia: Secondary | ICD-10-CM | POA: Diagnosis not present

## 2021-11-03 LAB — HM DIABETES EYE EXAM

## 2021-11-05 ENCOUNTER — Ambulatory Visit (INDEPENDENT_AMBULATORY_CARE_PROVIDER_SITE_OTHER): Payer: Medicare Other

## 2021-11-05 DIAGNOSIS — E538 Deficiency of other specified B group vitamins: Secondary | ICD-10-CM

## 2021-11-05 MED ORDER — CYANOCOBALAMIN 1000 MCG/ML IJ SOLN
1000.0000 ug | Freq: Once | INTRAMUSCULAR | Status: AC
  Administered 2021-11-05: 1000 ug via INTRAMUSCULAR

## 2021-11-05 NOTE — Progress Notes (Signed)
Per orders of Dr. Danise Mina, in the absence of Dr. Damita Dunnings, monthly injection of B12 given by Loreen Freud. Patient tolerated injection well.

## 2021-11-27 ENCOUNTER — Ambulatory Visit (INDEPENDENT_AMBULATORY_CARE_PROVIDER_SITE_OTHER): Payer: Medicare Other

## 2021-11-27 VITALS — Ht 66.0 in | Wt 209.0 lb

## 2021-11-27 DIAGNOSIS — Z0000 Encounter for general adult medical examination without abnormal findings: Secondary | ICD-10-CM

## 2021-11-27 NOTE — Progress Notes (Signed)
Virtual Visit via Telephone Note  I connected with  [name] on 11/27/21 at 11:00 AM EDT by telephone and verified that I am speaking with the correct person using two identifiers.  Location: Patient: home Provider: [place] Persons participating in the virtual visit: [place]   I discussed the limitations, risks, security and privacy concerns of performing an evaluation and management service by telephone and the availability of in person appointments. The patient expressed understanding and agreed to proceed.  Interactive audio and video telecommunications were attempted between this nurse and patient, however failed, due to patient having technical difficulties OR patient did not have access to video capability.  We continued and completed visit with audio only.  Some vital signs may be absent or patient reported.   Dionisio David, LPN  Subjective:   DAYLE MCNERNEY is a 88 y.o. male who presents for Medicare Annual/Subsequent preventive examination.  Review of Systems     Cardiac Risk Factors include: advanced age (>92mn, >>31women);hypertension;dyslipidemia;male gender     Objective:    There were no vitals filed for this visit. There is no height or weight on file to calculate BMI.     11/27/2021   11:07 AM 01/03/2021   10:26 AM 05/16/2019    2:44 PM 02/17/2019   10:21 AM 07/09/2018   12:43 PM 09/09/2017   11:46 AM 02/13/2016    2:08 PM  Advanced Directives  Does Patient Have a Medical Advance Directive? No Yes No Yes No Yes No  Type of ACorporate treasurerof [place] will  Healthcare Power of [place]  Does patient want to make changes to medical advance directive?  No - Patient declined  No - Patient declined  No - Patient declined   Copy of [place] Chart?  No - copy requested    No - copy requested   Would patient like information on creating a medical  advance directive? No - Patient declined  No - Patient declined  No - Guardian declined      Current Medications (verified) Outpatient Encounter Medications as of 11/27/2021  Medication Sig   acetaminophen (TYLENOL) 500 MG tablet Take 1,000 mg by mouth every 8 (eight) hours as needed for mild pain.   allopurinol (ZYLOPRIM) 100 MG tablet Take 1 tablet (100 mg total) by mouth daily.   allopurinol (ZYLOPRIM) 300 MG tablet    Besifloxacin HCl (BESIVANCE) 0.6 % SUSP    chlorhexidine (PERIDEX) 0.12 % solution    colchicine 0.6 MG tablet TAKE 1 TABLET BY MOUTH ONCE DAILY AS NEEDED. CAN TAKE 2ND TABLET ONCE DAILY AS NEEDED FOR GOUT FLARE   cyanocobalamin (,VITAMIN B-12,) 1000 MCG/ML injection 10055m IM every 28 days   diclofenac Sodium (VOLTAREN) 1 % GEL    Difluprednate (DUREZOL) 0.05 % EMUL    diltiazem (CARDIZEM CD) 180 MG 24 hr capsule Take 1 capsule (180 mg total) by mouth daily.   fluocinonide cream (LIDEX) 0.05 %    fluticasone (FLONASE) 50 MCG/ACT nasal spray Place 2 sprays into both nostrils daily. (Patient taking differently: Place 2 sprays into both nostrils as needed.)   furosemide (LASIX) 20 MG tablet Take 1 tablet (20 mg total) by mouth daily.   lisinopril (ZESTRIL) 10 MG tablet Take 1 tablet (10 mg total) by mouth daily. (Patient taking differently: Take 10 mg by mouth in the morning and at bedtime. Per patient taking 1/2 tablet  twice a day)   lisinopril (ZESTRIL) 20 MG tablet Take 1 tablet by mouth daily.   lisinopril (ZESTRIL) 40 MG tablet    omeprazole (PRILOSEC) 40 MG capsule Take 1 tablet by mouth daily.   sodium chloride (OCEAN) 0.65 % SOLN nasal spray Place 1 spray into both nostrils as needed for congestion.   triamcinolone (NASACORT) 55 MCG/ACT AERO nasal inhaler Place 2 sprays into the nose daily. 2 sprays each nostril at night   triamcinolone cream (KENALOG) 0.1 % APPLY TO AFFECTED AREA TWICE A DAY   warfarin (COUMADIN) 4 MG tablet TAKE 1 TO 1 AND 1/2 TABLETS BY MOUTH  DAILY OR AS DIRECTED BY COUMADIN CLINIC   azithromycin (ZITHROMAX) 250 MG tablet TAKE 2 TABLETS BY MOUTH TODAY, THEN TAKE 1 TABLET DAILY FOR 4 DAYS (Patient not taking: Reported on 11/27/2021)   baclofen (LIORESAL) 10 MG tablet TAKE 0.5-1 TABLETS (5-10 MG TOTAL) BY MOUTH AT BEDTIME AS NEEDED FOR MUSCLE SPASMS. (Patient not taking: Reported on 11/27/2021)   cephALEXin (KEFLEX) 500 MG capsule TAKE 1 CAPSULE BY MOUTH FOUR TIMES A DAY (Patient not taking: Reported on 11/27/2021)   clindamycin (CLEOCIN) 150 MG capsule  (Patient not taking: Reported on 11/27/2021)   clindamycin (CLEOCIN) 300 MG capsule TAKE 1 CAPSULE BY MOUTH THREE TIMES A DAY (Patient not taking: Reported on 11/27/2021)   doxycycline (VIBRA-TABS) 100 MG tablet Take 1 tablet by mouth 2 (two) times daily. (Patient not taking: Reported on 11/27/2021)   HYDROcodone-acetaminophen (NORCO/VICODIN) 5-325 MG tablet Take 1 tablet by mouth every 4 (four) hours as needed.   Influenza vac split quadrivalent PF (FLUZONE HIGH-DOSE) 0.5 ML injection TO BE ADMINISTERED BY PHARMACIST FOR IMMUNIZATION   ipratropium (ATROVENT) 0.03 % nasal spray PLACE 2 SPRAYS INTO BOTH NOSTRILS EVERY 12 HOURS   ipratropium (ATROVENT) 0.03 % nasal spray SMARTSIG:2 Spray(s) Both Nares Every 12 Hours   meloxicam (MOBIC) 15 MG tablet TAKE 0.5-1 TABLETS (7.5-15 MG TOTAL) BY MOUTH DAILY AS NEEDED FOR PAIN.   methylPREDNISolone (MEDROL DOSEPAK) 4 MG TBPK tablet Take by mouth. (Patient not taking: Reported on 11/27/2021)   metoprolol tartrate (LOPRESSOR) 25 MG tablet Take 0.5 tablets (12.5 mg total) by mouth 2 (two) times daily.   nepafenac (ILEVRO) 0.3 % ophthalmic suspension USE 1 DROP IN RIGHT EYE AT BEDTIME   predniSONE (DELTASONE) 10 MG tablet TAKE 2 A DAY FOR 5 DAYS, THEN 1 A DAY FOR 5 DAYS, WITH FOOD. DON'T TAKE WITH ALEVE/IBUPROFEN. (Patient not taking: Reported on 11/27/2021)   sulfamethoxazole-trimethoprim (BACTRIM DS) 800-160 MG tablet Take 1 tablet every 12 hours by oral route  for 10 days. (Patient not taking: Reported on 11/27/2021)   sulfamethoxazole-trimethoprim (BACTRIM DS) 800-160 MG tablet SMARTSIG:1 Tablet(s) By Mouth Every 12 Hours (Patient not taking: Reported on 11/27/2021)   sulfamethoxazole-trimethoprim (BACTRIM) 400-80 MG tablet Take 1 tablet by mouth 2 (two) times daily. (Patient not taking: Reported on 11/27/2021)   tamsulosin (FLOMAX) 0.4 MG CAPS capsule TAKE 1 CAPSULE BY MOUTH EVERY DAY (Patient not taking: Reported on 11/27/2021)   traMADol (ULTRAM) 50 MG tablet Take 1 tablet by mouth every 6 (six) hours as needed. (Patient not taking: Reported on 11/27/2021)   [DISCONTINUED] allopurinol (ZYLOPRIM) 100 MG tablet Take 1 tablet by mouth daily.   [DISCONTINUED] doxycycline (VIBRA-TABS) 100 MG tablet Take 1 tablet (100 mg total) by mouth 2 (two) times daily.   [DISCONTINUED] lisinopril (ZESTRIL) 10 MG tablet Take 1 tablet by mouth daily.   [DISCONTINUED] omeprazole (PRILOSEC) 40 MG capsule TAKE  1 CAPSULE BY MOUTH EVERY DAY   No facility-administered encounter medications on file as of 11/27/2021.    Allergies (verified) Penicillins, Atorvastatin, Pravastatin, Atrovent nasal spray [ipratropium], Colchicine, Hydrocodone, Nsaids, Uloric [febuxostat], Vancomycin, Clarithromycin, and Doxycycline   History: Past Medical History:  Diagnosis Date   Aortic stenosis    Arthritis    Bradycardia    a. nocturnal with pauses overnight on tele likely due to OSA 10/2015.   Cancer ([place])    skin cancer- burned off   CKD (chronic kidney disease), stage III (HCC)    Coronary artery disease    a. prev nonobst. b. LHC 10/24/15: occlusion of small diffusely diseased OM2 s/p balloon angioplasty, mild nonobstructive disease of mLAD and mRCA, normal LVEDP   Diabetes mellitus without complication ([place])    09/73/53 Pt's daughter states patient does not  have diabetes.  A1C 07/23/20 was 7.2, patientr had been on medication.   Diabetic peripheral neuropathy ([place]) 10/04/2019    Diverticulosis    Esophageal reflux    Esophageal stricture from GERD 06/08/2011   With stricture at GE junction on EGD, dilated 06/2011    Gastric ulcer    on EGD 2013   Gout, unspecified    "on daily RX" (09/09/2017)   History of blood transfusion 02/1964   "when I had right little finger cut off"   Hyperlipidemia    Hypertension    Obesity, unspecified    Orthopnea    OSA on CPAP    Paroxysmal nocturnal dyspnea    Persistent atrial fibrillation ([place])    Pneumonia    Vertigo    Past Surgical History:  Procedure Laterality Date   AMPUTATION Right 03/03/2019   Procedure: RIGHT 2ND TOE AMPUTATION;  Surgeon: [name], MD;  Location: [facility];  Service: Orthopedics;  Laterality: Right;   AMPUTATION Right 01/03/2021   Procedure: RIGHT 3RD TOE AMPUTATION;  Surgeon: [name], MD;  Location: [facility];  Service: Orthopedics;  Laterality: Right;   BALLOON DILATION N/A 10/22/2014   Procedure: BALLOON DILATION;  Surgeon: [name], MD;  Location: [facility];  Service: Endoscopy;  Laterality: N/A;   CARDIAC CATHETERIZATION  07/2008   CARDIAC CATHETERIZATION N/A 10/24/2015   Procedure: Left Heart Cath and Coronary Angiography;  Surgeon: [name], MD;  Location: [facility];  Service: Cardiovascular;  Laterality: N/A;   CARDIAC CATHETERIZATION N/A 10/24/2015   Procedure: Coronary Balloon Angioplasty;  Surgeon: [name], MD;  Location: [facility];  Service: Cardiovascular;  Laterality: N/A;   Carotid Dopplers  09/2009   no sig extracranial stenosis and vertebral arteries had antegrade flow   CATARACT EXTRACTION W/ INTRAOCULAR LENS  IMPLANT, BILATERAL Bilateral    ESOPHAGOGASTRODUODENOSCOPY (EGD) WITH ESOPHAGEAL DILATION     "I've had it stretched 3 times" (10/23/2015)   ESOPHAGOGASTRODUODENOSCOPY (EGD) WITH ESOPHAGEAL DILATION  06/2011   /medical hx above  (09/09/2017)   ESOPHAGOGASTRODUODENOSCOPY (EGD) WITH PROPOFOL N/A 10/22/2014   Procedure:  ESOPHAGOGASTRODUODENOSCOPY (EGD) WITH PROPOFOL;  Surgeon: [name], MD;  [facility];  Service: Endoscopy;  Laterality: N/A;   FINGER SURGERY Right 02/1964   "cut little finger off; had it reattached"   I & D EXTREMITY Right 09/10/2017   Procedure: RIGHT FOOT DEBRIDEMENT;  Surgeon: [name], MD;  Location: [facility];  Service: Orthopedics;  Laterality: Right;   MRI of brain  09/2009   chronic microvascular ischemia   OSA Sleep Study     per Dr. Brett Fairy  SHOULDER ARTHROSCOPY W/ ROTATOR CUFF REPAIR Right 2008   Family History  Problem Relation Age of Onset   Stomach cancer Mother        died in 42's    Hypertension Father        died in his 61's pna likely dementia   Dementia Father    Pneumonia Father    Heart disease Brother        S/P CABG   Hypertension Other    Sleep apnea Daughter    Prostate cancer Neg Hx    Colon cancer Neg Hx    Social History   Socioeconomic History   Marital status: Widowed    Spouse name: Not on file   Number of children: 3   Years of education: Not on file   Highest education level: Not on file  Occupational History   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  Tobacco Use   Smoking status: Former    Packs/day: 0.12    Years: 4.00    Total pack years: 0.48    Types: Cigarettes    Quit date: 03/16/1969    Years since quitting: 52.7   Smokeless tobacco: Former   Tobacco comments:    "chewed when I smoked; smoked 1 pack/week for about 4 years, quit 1971  Vaping Use   Vaping Use: Never used  Substance and Sexual Activity   Alcohol use: Not Currently    Alcohol/week: 0.0 standard drinks of alcohol    Comment: 09/09/2017  "Quit all alcohol in 1970's"   Drug use: Never   Sexual activity: Not on file  Other Topics Concern   Not on file  Social History Narrative   Lives in [place], married 1953, widowed after 34 years   Retired Administrator, still does Biomedical scientist.   He is not routinely  exercising.   Daily caffeine    Social Determinants of Health   Financial Resource Strain: Low Risk  (11/27/2021)   Overall Financial Resource Strain (CARDIA)    Difficulty of Paying Living Expenses: Not hard at all  Food Insecurity: No Food Insecurity (11/27/2021)   Hunger Vital Sign    Worried About Running Out of Food in the Last Year: Never true    Ran Out of Food in the Last Year: Never true  Transportation Needs: No Transportation Needs (11/27/2021)   PRAPARE - Hydrologist (Medical): No    Lack of Transportation (Non-Medical): No  Physical Activity: Insufficiently Active (11/27/2021)   Exercise Vital Sign    Days of Exercise per Week: 7 days    Minutes of Exercise per Session: 20 min  Stress: No Stress Concern Present (11/27/2021)   [place]    Feeling of Stress : Not at all  Social Connections: Moderately Integrated (11/27/2021)   Social Connection and Isolation Panel [NHANES]    Frequency of Communication with Friends and Family: Twice a week    Frequency of Social Gatherings with Friends and Family: Twice a week    Attends Religious Services: 1 to 4 times per year    Active Member of [employer] or Organizations: No    Attends Music therapist: More than 4 times per year    Marital Status: Widowed    Tobacco Counseling Counseling given: Not Answered Tobacco comments: "chewed when I smoked; smoked 1 pack/week for about 4 years, quit 1971   Clinical Intake:  Pre-visit preparation  completed: Yes  Pain : No/denies pain     Nutritional Risks: None Diabetes: No  How often do you need to have someone help you when you read instructions, pamphlets, or other written materials from your doctor or pharmacy?: 1 - Never  Diabetic?no  Interpreter Needed?: No  Information entered by :: Kirke Shaggy, LPN   Activities of Daily Living    11/27/2021   11:09 AM 11/26/2021    11:46 AM  In your present state of health, do you have any difficulty performing the following activities:  Hearing? 0 0  Vision? 0 0  Difficulty concentrating or making decisions? 0 0  Walking or climbing stairs? 0 1  Dressing or bathing? 0 0  Doing errands, shopping? 0 0  Preparing Food and eating ? N N  Using the Toilet? N N  In the past six months, have you accidently leaked urine? N N  Do you have problems with loss of bowel control? N N  Managing your Medications? N N  Managing your Finances? N N  Housekeeping or managing your Housekeeping? N N    Patient Care Team: Tonia Ghent, MD as PCP - Cyndia Diver, MD as PCP - Cardiology (Cardiology) Sherren Mocha, MD (Cardiology) Elsie Saas, MD as Consulting Physician (Orthopedic Surgery) Debbora Dus, [facility] as Pharmacist (Pharmacist) Gean Quint, MD as Consulting Physician (Nephrology)  Indicate any recent Medical Services you may have received from other than [facility] providers in the past year (date may be approximate).     Assessment:   This is a routine wellness examination for Bryan.  Hearing/Vision screen Hearing Screening - Comments:: No aids Vision Screening - Comments:: Wears glasses- MD in [place]  Dietary issues and exercise activities discussed: Current Exercise Habits: Home exercise routine, Type of exercise: walking, Time (Minutes): 20, Frequency (Times/Week): 7, Weekly Exercise (Minutes/Week): 140, Intensity: Mild   Goals Addressed             This Visit's Progress    DIET - EAT MORE FRUITS AND VEGETABLES         Depression Screen    11/27/2021   11:06 AM 11/23/2020   11:05 AM 05/16/2019    2:45 PM 03/30/2019   10:55 AM 02/13/2016    2:08 PM 12/17/2014   12:37 PM 08/22/2013   12:44 PM  PHQ 2/9 Scores  PHQ - 2 Score 0 0 0 0 0 0 0  PHQ- 9 Score 0  0        Fall Risk    11/27/2021   11:08 AM 11/26/2021   11:46 AM 06/20/2020   12:57 PM 05/16/2019    2:45 PM 03/30/2019   10:54 AM  Fall  Risk   Falls in the past year? 0 0 0 0 0  Number falls in past yr: 0   0 0  Injury with Fall? 0   0   Risk for fall due to : No Fall Risks   Medication side effect   Follow up Falls prevention discussed;Falls evaluation completed   Falls evaluation completed;Falls prevention discussed Falls evaluation completed    FALL RISK PREVENTION PERTAINING TO THE HOME:  Any stairs in or around the home? No  If so, are there any without handrails? No  Home free of loose throw rugs in walkways, pet beds, electrical cords, etc? Yes  Adequate lighting in your home to reduce risk of falls? Yes   ASSISTIVE DEVICES UTILIZED TO PREVENT FALLS:  Life alert? No  Use of  a cane, walker or w/c? No  Grab bars in the bathroom? Yes  Shower chair or bench in shower? Yes  Elevated toilet seat or a handicapped toilet? Yes    Cognitive Function:    05/16/2019    2:47 PM 02/13/2016    2:10 PM  MMSE - Mini Mental State Exam  Orientation to time 5 5  Orientation to Place 5 5  Registration 3 3  Attention/ Calculation 5 0  Recall 2 3  Language- name 2 objects  0  Language- repeat 1 1  Language- follow 3 step command  3  Language- read & follow direction  0  Write a sentence  0  Copy design  0  Total score  20        11/27/2021   11:10 AM  6CIT Screen  What Year? 0 points  What month? 0 points  What time? 0 points  Count back from 20 0 points  Months in reverse 0 points  Repeat phrase 2 points  Total Score 2 points    Immunizations Immunization History  Administered Date(s) Administered   Influenza, High Dose Seasonal PF 12/15/2016, 12/19/2018   Influenza, Seasonal, Injecte, Preservative Fre 12/14/2013   Influenza,inj,Quad PF,6+ Mos 12/17/2014, 01/29/2018   Influenza-Unspecified 12/15/2016, 12/19/2018, 01/11/2021   Moderna Sars-Covid-2 Vaccination 04/11/2019, 05/09/2019, 01/15/2020, 08/09/2020   Pneumococcal Conjugate-13 12/17/2014   Pneumococcal Polysaccharide-23 09/19/2009   Td 09/19/2009    Zoster, Live 09/19/2009    TDAP status: Due, Education has been provided regarding the importance of this vaccine. Advised may receive this vaccine at local pharmacy or Health Dept. Aware to provide a copy of the vaccination record if obtained from local pharmacy or Health Dept. Verbalized acceptance and understanding.  Flu Vaccine status: Up to date  Pneumococcal vaccine status: Up to date  Covid-19 vaccine status: Completed vaccines  Qualifies for Shingles Vaccine? Yes   Zostavax completed Yes   Shingrix Completed?: No.    Education has been provided regarding the importance of this vaccine. Patient has been advised to call insurance company to determine out of pocket expense if they have not yet received this vaccine. Advised may also receive vaccine at local pharmacy or Health Dept. Verbalized acceptance and understanding.  Screening Tests Health Maintenance  Topic Date Due   Zoster Vaccines- Shingrix (1 of 2) Never done   TETANUS/TDAP  09/20/2019   OPHTHALMOLOGY EXAM  05/25/2020   COVID-19 Vaccine (5 - Moderna series) 10/04/2020   INFLUENZA VACCINE  10/14/2021   HEMOGLOBIN A1C  11/16/2021   FOOT EXAM  05/17/2022   Pneumonia Vaccine 69+ Years old  Completed   HPV VACCINES  Aged Out    Health Maintenance  Health Maintenance Due  Topic Date Due   Zoster Vaccines- Shingrix (1 of 2) Never done   TETANUS/TDAP  09/20/2019   OPHTHALMOLOGY EXAM  05/25/2020   COVID-19 Vaccine (5 - Moderna series) 10/04/2020   INFLUENZA VACCINE  10/14/2021   HEMOGLOBIN A1C  11/16/2021    Colorectal cancer screening: No longer required.   Lung Cancer Screening: (Low Dose CT Chest recommended if Age 34-80 years, 30 pack-year currently smoking OR have quit w/in 15years.) does not qualify.   Additional Screening:  Hepatitis C Screening: does not qualify; Completed no  Vision Screening: Recommended annual ophthalmology exams for early detection of glaucoma and other disorders of the eye. Is  the patient up to date with their annual eye exam?  Yes  Who is the provider or what is the name  of the office in which the patient attends annual eye exams? MD in [place] If pt is not established with a provider, would they like to be referred to a provider to establish care? No .   Dental Screening: Recommended annual dental exams for proper oral hygiene  Community Resource Referral / Chronic Care Management: CRR required this visit?  No   CCM required this visit?  No      Plan:     I have personally reviewed and noted the following in the patient's chart:   Medical and social history Use of alcohol, tobacco or illicit drugs  Current medications and supplements including opioid prescriptions. Patient is not currently taking opioid prescriptions. Functional ability and status Nutritional status Physical activity Advanced directives List of other physicians Hospitalizations, surgeries, and ER visits in previous 12 months Vitals Screenings to include cognitive, depression, and falls Referrals and appointments  In addition, I have reviewed and discussed with patient certain preventive protocols, quality metrics, and best practice recommendations. A written personalized care plan for preventive services as well as general preventive health recommendations were provided to patient.     Dionisio David, LPN   7/97/2820   Nurse Notes: none

## 2021-11-27 NOTE — Patient Instructions (Signed)
[name] , Thank you for taking time to come for your Medicare Wellness Visit. I appreciate your ongoing commitment to your health goals. Please review the following plan we discussed and let me know if I can assist you in the future.   Screening recommendations/referrals: Colonoscopy: aged out Recommended yearly ophthalmology/optometry visit for glaucoma screening and checkup Recommended yearly dental visit for hygiene and checkup  Vaccinations: Influenza vaccine: 01/11/21 Pneumococcal vaccine: 12/17/14 Tdap vaccine: 09/19/09, due if have injury Shingles vaccine: Zostavax 09/19/09   Covid-19: 04/11/19, 05/09/19, 01/15/20, 08/09/20  Advanced directives: no  Conditions/risks identified: none  Next appointment: Follow up in one year for your annual wellness visit. 11/30/22 @ 11:30 am by phone  Preventive Care 65 Years and Older, Male Preventive care refers to lifestyle choices and visits with your health care provider that can promote health and wellness. What does preventive care include? A yearly physical exam. This is also called an annual well check. Dental exams once or twice a year. Routine eye exams. Ask your health care provider how often you should have your eyes checked. Personal lifestyle choices, including: Daily care of your teeth and gums. Regular physical activity. Eating a healthy diet. Avoiding tobacco and drug use. Limiting alcohol use. Practicing safe sex. Taking low doses of aspirin every day. Taking vitamin and mineral supplements as recommended by your health care provider. What happens during an annual well check? The services and screenings done by your health care provider during your annual well check will depend on your age, overall health, lifestyle risk factors, and family history of disease. Counseling  Your health care provider may ask you questions about your: Alcohol use. Tobacco use. Drug use. Emotional well-being. Home and relationship  well-being. Sexual activity. Eating habits. History of falls. Memory and ability to understand (cognition). Work and work Statistician. Screening  You may have the following tests or measurements: Height, weight, and BMI. Blood pressure. Lipid and cholesterol levels. These may be checked every 5 years, or more frequently if you are over 30 years old. Skin check. Lung cancer screening. You may have this screening every year starting at age 36 if you have a 30-pack-year history of smoking and currently smoke or have quit within the past 15 years. Fecal occult blood test (FOBT) of the stool. You may have this test every year starting at age 57. Flexible sigmoidoscopy or colonoscopy. You may have a sigmoidoscopy every 5 years or a colonoscopy every 10 years starting at age 70. Prostate cancer screening. Recommendations will vary depending on your family history and other risks. Hepatitis C blood test. Hepatitis B blood test. Sexually transmitted disease (STD) testing. Diabetes screening. This is done by checking your blood sugar (glucose) after you have not eaten for a while (fasting). You may have this done every 1-3 years. Abdominal aortic aneurysm (AAA) screening. You may need this if you are a current or former smoker. Osteoporosis. You may be screened starting at age 42 if you are at high risk. Talk with your health care provider about your test results, treatment options, and if necessary, the need for more tests. Vaccines  Your health care provider may recommend certain vaccines, such as: Influenza vaccine. This is recommended every year. Tetanus, diphtheria, and acellular pertussis (Tdap, Td) vaccine. You may need a Td booster every 10 years. Zoster vaccine. You may need this after age 52. Pneumococcal 13-valent conjugate (PCV13) vaccine. One dose is recommended after age 19. Pneumococcal polysaccharide (PPSV23) vaccine. One dose is recommended after  age 45. Talk to your health care  provider about which screenings and vaccines you need and how often you need them. This information is not intended to replace advice given to you by your health care provider. Make sure you discuss any questions you have with your health care provider. Document Released: 03/29/2015 Document Revised: 11/20/2015 Document Reviewed: 01/01/2015 Elsevier Interactive Patient Education  2017 [place] Prevention in the Home Falls can cause injuries. They can happen to people of all ages. There are many things you can do to make your home safe and to help prevent falls. What can I do on the outside of my home? Regularly fix the edges of walkways and driveways and fix any cracks. Remove anything that might make you trip as you walk through a door, such as a raised step or threshold. Trim any bushes or trees on the path to your home. Use bright outdoor lighting. Clear any walking paths of anything that might make someone trip, such as rocks or tools. Regularly check to see if handrails are loose or broken. Make sure that both sides of any steps have handrails. Any raised decks and porches should have guardrails on the edges. Have any leaves, snow, or ice cleared regularly. Use sand or salt on walking paths during winter. Clean up any spills in your garage right away. This includes oil or grease spills. What can I do in the bathroom? Use night lights. Install grab bars by the toilet and in the tub and shower. Do not use towel bars as grab bars. Use non-skid mats or decals in the tub or shower. If you need to sit down in the shower, use a plastic, non-slip stool. Keep the floor dry. Clean up any water that spills on the floor as soon as it happens. Remove soap buildup in the tub or shower regularly. Attach bath mats securely with double-sided non-slip rug tape. Do not have throw rugs and other things on the floor that can make you trip. What can I do in the bedroom? Use night lights. Make  sure that you have a light by your bed that is easy to reach. Do not use any sheets or blankets that are too big for your bed. They should not hang down onto the floor. Have a firm chair that has side arms. You can use this for support while you get dressed. Do not have throw rugs and other things on the floor that can make you trip. What can I do in the kitchen? Clean up any spills right away. Avoid walking on wet floors. Keep items that you use a lot in easy-to-reach places. If you need to reach something above you, use a strong step stool that has a grab bar. Keep electrical cords out of the way. Do not use floor polish or wax that makes floors slippery. If you must use wax, use non-skid floor wax. Do not have throw rugs and other things on the floor that can make you trip. What can I do with my stairs? Do not leave any items on the stairs. Make sure that there are handrails on both sides of the stairs and use them. Fix handrails that are broken or loose. Make sure that handrails are as long as the stairways. Check any carpeting to make sure that it is firmly attached to the stairs. Fix any carpet that is loose or worn. Avoid having throw rugs at the top or bottom of the stairs. If you do  have throw rugs, attach them to the floor with carpet tape. Make sure that you have a light switch at the top of the stairs and the bottom of the stairs. If you do not have them, ask someone to add them for you. What else can I do to help prevent falls? Wear shoes that: Do not have high heels. Have rubber bottoms. Are comfortable and fit you well. Are closed at the toe. Do not wear sandals. If you use a stepladder: Make sure that it is fully opened. Do not climb a closed stepladder. Make sure that both sides of the stepladder are locked into place. Ask someone to hold it for you, if possible. Clearly mark and make sure that you can see: Any grab bars or handrails. First and last steps. Where the  edge of each step is. Use tools that help you move around (mobility aids) if they are needed. These include: Canes. Walkers. Scooters. Crutches. Turn on the lights when you go into a dark area. Replace any light bulbs as soon as they burn out. Set up your furniture so you have a clear path. Avoid moving your furniture around. If any of your floors are uneven, fix them. If there are any pets around you, be aware of where they are. Review your medicines with your doctor. Some medicines can make you feel dizzy. This can increase your chance of falling. Ask your doctor what other things that you can do to help prevent falls. This information is not intended to replace advice given to you by your health care provider. Make sure you discuss any questions you have with your health care provider. Document Released: 12/27/2008 Document Revised: 08/08/2015 Document Reviewed: 04/06/2014 Elsevier Interactive Patient Education  2017 [employer].

## 2021-11-30 ENCOUNTER — Other Ambulatory Visit: Payer: Self-pay | Admitting: Cardiovascular Disease

## 2021-11-30 DIAGNOSIS — I482 Chronic atrial fibrillation, unspecified: Secondary | ICD-10-CM

## 2021-12-02 ENCOUNTER — Ambulatory Visit: Payer: Medicare Other

## 2021-12-05 ENCOUNTER — Ambulatory Visit (INDEPENDENT_AMBULATORY_CARE_PROVIDER_SITE_OTHER): Payer: Medicare Other

## 2021-12-05 DIAGNOSIS — E538 Deficiency of other specified B group vitamins: Secondary | ICD-10-CM | POA: Diagnosis not present

## 2021-12-05 MED ORDER — CYANOCOBALAMIN 1000 MCG/ML IJ SOLN
1000.0000 ug | Freq: Once | INTRAMUSCULAR | Status: AC
  Administered 2021-12-05: 1000 ug via INTRAMUSCULAR

## 2021-12-05 NOTE — Progress Notes (Signed)
Per orders of Dr. Elsie Stain, injection of vitamin B12 given in right deltoid given by Ozzie Hoyle. Patient tolerated injection well.

## 2021-12-11 ENCOUNTER — Ambulatory Visit: Payer: Medicare Other | Attending: Cardiovascular Disease

## 2021-12-11 ENCOUNTER — Ambulatory Visit: Payer: Medicare Other | Admitting: Adult Health

## 2021-12-11 DIAGNOSIS — I482 Chronic atrial fibrillation, unspecified: Secondary | ICD-10-CM

## 2021-12-11 LAB — POCT INR: INR: 1.8 — AB (ref 2.0–3.0)

## 2021-12-11 NOTE — Patient Instructions (Signed)
Description   Take an extra 1/2 tablet today and then continue taking Warfarin 1 tablet daily except 1.5 tablets on Sundays and Thursdays. Recheck INR in 6 weeks. Call Coumadin clinic for any changes in medications or up coming procedures. [phone].       

## 2021-12-17 ENCOUNTER — Encounter: Payer: Self-pay | Admitting: *Deleted

## 2021-12-18 ENCOUNTER — Encounter: Payer: Self-pay | Admitting: Adult Health

## 2021-12-18 ENCOUNTER — Ambulatory Visit (INDEPENDENT_AMBULATORY_CARE_PROVIDER_SITE_OTHER): Payer: Medicare Other | Admitting: Adult Health

## 2021-12-18 VITALS — BP 117/73 | HR 81 | Ht 66.0 in | Wt 213.8 lb

## 2021-12-18 DIAGNOSIS — G4733 Obstructive sleep apnea (adult) (pediatric): Secondary | ICD-10-CM

## 2021-12-18 DIAGNOSIS — I2510 Atherosclerotic heart disease of native coronary artery without angina pectoris: Secondary | ICD-10-CM | POA: Diagnosis not present

## 2021-12-18 NOTE — Progress Notes (Signed)
PATIENT: SHAUNAK KREIS DOB: [birth year]  REASON FOR VISIT: follow up HISTORY FROM: patient PRIMARY NEUROLOGIST: Dr. Brett Fairy  Chief Complaint  Patient presents with   Follow-up    Pt in 89 with daughter  Pt here for CPAP f/u Pt states no questions or concerns this visit     HISTORY OF PRESENT ILLNESS: Today 12/18/21:  Mr. Maffei is an 88 year old male with a history of obstructive sleep apnea on BiPAP.  He returns today for follow-up.  His download is below.  He denies any new issues.  Reports that the BiPAP works well for him.  He does not like to sleep without it.  Returns today for an evaluation.      REVIEW OF SYSTEMS: Out of a complete 14 system review of symptoms, the patient complains only of the following symptoms, and all other reviewed systems are negative.    ESS 7  ALLERGIES: Allergies  Allergen Reactions   Penicillins Shortness Of Breath and Swelling    Patient tolerated Rocephin injection 2019  PATIENT HAS HAD A PCN REACTION WITH IMMEDIATE RASH, FACIAL/TONGUE/THROAT SWELLING, SOB, OR LIGHTHEADEDNESS WITH HYPOTENSION:  #  #  YES  #  Has patient had a PCN reaction causing severe rash involving mucus membranes or skin necrosis: NO Has patient had a PCN reaction that required hospitalization NO Has patient had a PCN reaction occurring within the last 10 years: NO If all of the above answers are "NO", then may proceed with Cephalosporin use.    Atorvastatin Other (See Comments)    No energy, just felt bad     Pravastatin Other (See Comments)    myalgias   Atrovent Nasal Spray [Ipratropium]     Dizzy with use   Colchicine     Due to renal function- can use prednisone if needed    Hydrocodone Other (See Comments)    "swimmy headed" after use   Nsaids     Due to renal function   Uloric [Febuxostat] Other (See Comments)    Would avoid, worsening gout sx with use.    Vancomycin Other (See Comments)    Red man syndrome- tolerated with slow run and  benadryl use   Clarithromycin Diarrhea and Other (See Comments)    Other reaction(s): Not available   Doxycycline Other (See Comments)    GI upset Other reaction(s): Not available    HOME MEDICATIONS: Outpatient Medications Prior to Visit  Medication Sig Dispense Refill   acetaminophen (TYLENOL) 500 MG tablet Take 1,000 mg by mouth every 8 (eight) hours as needed for mild pain.     allopurinol (ZYLOPRIM) 100 MG tablet Take 1 tablet (100 mg total) by mouth daily.     allopurinol (ZYLOPRIM) 300 MG tablet      Besifloxacin HCl (BESIVANCE) 0.6 % SUSP      chlorhexidine (PERIDEX) 0.12 % solution      colchicine 0.6 MG tablet TAKE 1 TABLET BY MOUTH ONCE DAILY AS NEEDED. CAN TAKE 2ND TABLET ONCE DAILY AS NEEDED FOR GOUT FLARE     cyanocobalamin (,VITAMIN B-12,) 1000 MCG/ML injection 1074mg IM every 28 days 1 mL    diclofenac Sodium (VOLTAREN) 1 % GEL      Difluprednate (DUREZOL) 0.05 % EMUL      diltiazem (CARDIZEM CD) 180 MG 24 hr capsule Take 1 capsule (180 mg total) by mouth daily. 90 capsule 3   fluocinonide cream (LIDEX) 0.05 %      fluticasone (FLONASE) 50 MCG/ACT nasal spray Place  2 sprays into both nostrils daily. (Patient taking differently: Place 2 sprays into both nostrils as needed.)     furosemide (LASIX) 20 MG tablet Take 1 tablet (20 mg total) by mouth daily. 90 tablet 3   HYDROcodone-acetaminophen (NORCO/VICODIN) 5-325 MG tablet Take 1 tablet by mouth every 4 (four) hours as needed.     Influenza vac split quadrivalent PF (FLUZONE HIGH-DOSE) 0.5 ML injection TO BE ADMINISTERED BY PHARMACIST FOR IMMUNIZATION     ipratropium (ATROVENT) 0.03 % nasal spray PLACE 2 SPRAYS INTO BOTH NOSTRILS EVERY 12 HOURS     ipratropium (ATROVENT) 0.03 % nasal spray SMARTSIG:2 Spray(s) Both Nares Every 12 Hours     lisinopril (ZESTRIL) 10 MG tablet Take 1 tablet (10 mg total) by mouth daily. (Patient taking differently: Take 10 mg by mouth in the morning and at bedtime. Per patient taking 1/2 tablet  twice a day)     lisinopril (ZESTRIL) 20 MG tablet Take 1 tablet by mouth daily.     lisinopril (ZESTRIL) 40 MG tablet      meloxicam (MOBIC) 15 MG tablet TAKE 0.5-1 TABLETS (7.5-15 MG TOTAL) BY MOUTH DAILY AS NEEDED FOR PAIN.     metoprolol tartrate (LOPRESSOR) 25 MG tablet Take 0.5 tablets (12.5 mg total) by mouth 2 (two) times daily. 90 tablet 3   nepafenac (ILEVRO) 0.3 % ophthalmic suspension USE 1 DROP IN RIGHT EYE AT BEDTIME     omeprazole (PRILOSEC) 40 MG capsule Take 1 tablet by mouth daily.     sodium chloride (OCEAN) 0.65 % SOLN nasal spray Place 1 spray into both nostrils as needed for congestion.     triamcinolone (NASACORT) 55 MCG/ACT AERO nasal inhaler Place 2 sprays into the nose daily. 2 sprays each nostril at night 1 each 12   triamcinolone cream (KENALOG) 0.1 % APPLY TO AFFECTED AREA TWICE A DAY     warfarin (COUMADIN) 4 MG tablet TAKE 1 TO 1 AND 1/2 TABLETS BY MOUTH DAILY OR AS DIRECTED BY COUMADIN CLINIC 120 tablet 1   azithromycin (ZITHROMAX) 250 MG tablet      baclofen (LIORESAL) 10 MG tablet      cephALEXin (KEFLEX) 500 MG capsule      clindamycin (CLEOCIN) 150 MG capsule      clindamycin (CLEOCIN) 300 MG capsule      doxycycline (VIBRA-TABS) 100 MG tablet Take 1 tablet by mouth 2 (two) times daily.     methylPREDNISolone (MEDROL DOSEPAK) 4 MG TBPK tablet Take by mouth.     predniSONE (DELTASONE) 10 MG tablet      sulfamethoxazole-trimethoprim (BACTRIM DS) 800-160 MG tablet      sulfamethoxazole-trimethoprim (BACTRIM DS) 800-160 MG tablet SMARTSIG:1 Tablet(s) By Mouth Every 12 Hours (Patient not taking: Reported on 11/27/2021)     sulfamethoxazole-trimethoprim (BACTRIM) 400-80 MG tablet Take 1 tablet by mouth 2 (two) times daily. (Patient not taking: Reported on 11/27/2021)     tamsulosin (FLOMAX) 0.4 MG CAPS capsule TAKE 1 CAPSULE BY MOUTH EVERY DAY (Patient not taking: Reported on 11/27/2021) 90 capsule 3   traMADol (ULTRAM) 50 MG tablet Take 1 tablet by mouth every 6  (six) hours as needed. (Patient not taking: Reported on 11/27/2021)     No facility-administered medications prior to visit.    PAST MEDICAL HISTORY: Past Medical History:  Diagnosis Date   Aortic stenosis    Arthritis    Bradycardia    a. nocturnal with pauses overnight on tele likely due to OSA 10/2015.   Cancer ([place])  skin cancer- burned off   CKD (chronic kidney disease), stage III (HCC)    Coronary artery disease    a. prev nonobst. b. LHC 10/24/15: occlusion of small diffusely diseased OM2 s/p balloon angioplasty, mild nonobstructive disease of mLAD and mRCA, normal LVEDP   Diabetes mellitus without complication ([place])    51/88/41 Pt's daughter states patient does not  have diabetes.  A1C 07/23/20 was 7.2, patientr had been on medication.   Diabetic peripheral neuropathy ([place]) 10/04/2019   Diverticulosis    Esophageal reflux    Esophageal stricture from GERD 06/08/2011   With stricture at GE junction on EGD, dilated 06/2011    Gastric ulcer    on EGD 2013   Gout, unspecified    "on daily RX" (09/09/2017)   History of blood transfusion 02/1964   "when I had right little finger cut off"   Hyperlipidemia    Hypertension    Obesity, unspecified    Orthopnea    OSA on CPAP    Paroxysmal nocturnal dyspnea    Persistent atrial fibrillation ([place])    Pneumonia    Vertigo     PAST SURGICAL HISTORY: Past Surgical History:  Procedure Laterality Date   AMPUTATION Right 03/03/2019   Procedure: RIGHT 2ND TOE AMPUTATION;  Surgeon: [name], MD;  Location: [facility];  Service: Orthopedics;  Laterality: Right;   AMPUTATION Right 01/03/2021   Procedure: RIGHT 3RD TOE AMPUTATION;  Surgeon: [name], MD;  Location: [facility];  Service: Orthopedics;  Laterality: Right;   BALLOON DILATION N/A 10/22/2014   Procedure: BALLOON DILATION;  Surgeon: [name], MD;  Location: [facility];  Service: Endoscopy;  Laterality: N/A;   CARDIAC CATHETERIZATION  07/2008   CARDIAC CATHETERIZATION  N/A 10/24/2015   Procedure: Left Heart Cath and Coronary Angiography;  Surgeon: [name], MD;  Location: [facility];  Service: Cardiovascular;  Laterality: N/A;   CARDIAC CATHETERIZATION N/A 10/24/2015   Procedure: Coronary Balloon Angioplasty;  Surgeon: [name], MD;  Location: [facility];  Service: Cardiovascular;  Laterality: N/A;   Carotid Dopplers  09/2009   no sig extracranial stenosis and vertebral arteries had antegrade flow   CATARACT EXTRACTION W/ INTRAOCULAR LENS  IMPLANT, BILATERAL Bilateral    ESOPHAGOGASTRODUODENOSCOPY (EGD) WITH ESOPHAGEAL DILATION     "I've had it stretched 3 times" (10/23/2015)   ESOPHAGOGASTRODUODENOSCOPY (EGD) WITH ESOPHAGEAL DILATION  06/2011   /medical hx above  (09/09/2017)   ESOPHAGOGASTRODUODENOSCOPY (EGD) WITH PROPOFOL N/A 10/22/2014   Procedure: ESOPHAGOGASTRODUODENOSCOPY (EGD) WITH PROPOFOL;  Surgeon: [name], MD;  Location: [facility];  Service: Endoscopy;  Laterality: N/A;   FINGER SURGERY Right 02/1964   "cut little finger off; had it reattached"   I & D EXTREMITY Right 09/10/2017   Procedure: RIGHT FOOT DEBRIDEMENT;  Surgeon: [name], MD;  Location: [facility];  Service: Orthopedics;  Laterality: Right;   MRI of brain  09/2009   chronic microvascular ischemia   OSA Sleep Study     per Dr. Brett Fairy   SHOULDER ARTHROSCOPY W/ ROTATOR CUFF REPAIR Right 2008    FAMILY HISTORY: Family History  Problem Relation Age of Onset   Stomach cancer Mother        died in 71's    Hypertension Father        died in his 57's pna likely dementia   Dementia Father    Pneumonia Father    Heart disease Brother  S/P CABG   Sleep apnea Brother    Sleep apnea Daughter    Hypertension Other    Prostate cancer Neg Hx    Colon cancer Neg Hx     SOCIAL HISTORY: Social History   Socioeconomic History   Marital status: Widowed    Spouse name: Not on file   Number of children: 3   Years of education: Not on file    Highest education level: Not on file  Occupational History   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  Tobacco Use   Smoking status: Former    Packs/day: 0.12    Years: 4.00    Total pack years: 0.48    Types: Cigarettes    Quit date: 03/16/1969    Years since quitting: 52.7   Smokeless tobacco: Former   Tobacco comments:    "chewed when I smoked; smoked 1 pack/week for about 4 years, quit 1971  Vaping Use   Vaping Use: Never used  Substance and Sexual Activity   Alcohol use: Not Currently    Alcohol/week: 0.0 standard drinks of alcohol    Comment: 09/09/2017  "Quit all alcohol in 1970's"   Drug use: Never   Sexual activity: Not on file  Other Topics Concern   Not on file  Social History Narrative   Lives in [place], married 1953, widowed after 20 years   Retired Administrator, still does Biomedical scientist.   He is not routinely exercising.   Daily caffeine    Social Determinants of Health   Financial Resource Strain: Low Risk  (11/27/2021)   Overall Financial Resource Strain (CARDIA)    Difficulty of Paying Living Expenses: Not hard at all  Food Insecurity: No Food Insecurity (11/27/2021)   Hunger Vital Sign    Worried About Running Out of Food in the Last Year: Never true    Ran Out of Food in the Last Year: Never true  Transportation Needs: No Transportation Needs (11/27/2021)   PRAPARE - Hydrologist (Medical): No    Lack of Transportation (Non-Medical): No  Physical Activity: Insufficiently Active (11/27/2021)   Exercise Vital Sign    Days of Exercise per Week: 7 days    Minutes of Exercise per Session: 20 min  Stress: No Stress Concern Present (11/27/2021)   [place]    Feeling of Stress : Not at all  Social Connections: Moderately Integrated (11/27/2021)   Social Connection and Isolation Panel [NHANES]    Frequency of Communication with Friends and  Family: Twice a week    Frequency of Social Gatherings with Friends and Family: Twice a week    Attends Religious Services: 1 to 4 times per year    Active Member of [employer] or Organizations: No    Attends Music therapist: More than 4 times per year    Marital Status: Widowed  Intimate Partner Violence: Not At Risk (11/27/2021)   Humiliation, Afraid, Rape, and Kick questionnaire    Fear of Current or Ex-Partner: No    Emotionally Abused: No    Physically Abused: No    Sexually Abused: No      PHYSICAL EXAM  Vitals:   12/18/21 1055  BP: 117/73  Pulse: 81  Weight: 213 lb 12.8 oz (97 kg)  Height: '5\' 6"'$  (1.676 m)   Body mass index is 34.51 kg/m.  Generalized: Well developed, in no acute distress  Chest: Lungs  clear to auscultation bilaterally  Neurological examination  Mentation: Alert oriented to time, place, history taking. Follows all commands speech and language fluent Gait and station: Gait is normal.    DIAGNOSTIC DATA (LABS, IMAGING, TESTING) - I reviewed patient records, labs, notes, testing and imaging myself where available.  Lab Results  Component Value Date   WBC 5.8 05/16/2021   HGB 16.1 05/16/2021   HCT 48.4 05/16/2021   MCV 84.6 05/16/2021   PLT CANCELED 05/16/2021      Component Value Date/Time   NA 141 05/16/2021 1544   K 4.6 05/16/2021 1544   CL 103 05/16/2021 1544   CO2 25 05/16/2021 1544   GLUCOSE 86 05/16/2021 1544   BUN 19 05/16/2021 1544   CREATININE 1.15 05/16/2021 1544   CALCIUM 9.9 05/16/2021 1544   PROT 7.5 09/17/2020 1118   PROT 7.1 09/07/2019 1020   ALBUMIN 4.0 09/17/2020 1118   AST 26 09/17/2020 1118   ALT 24 09/17/2020 1118   ALKPHOS 86 09/17/2020 1118   BILITOT 1.1 09/17/2020 1118   GFRNONAA 35 (L) 01/03/2021 1056   GFRAA 55 (L) 03/03/2019 0643   Lab Results  Component Value Date   CHOL 166 05/16/2019   HDL 26.40 (L) 05/16/2019   LDLCALC 82 10/24/2015   LDLDIRECT 95.0 05/16/2019   TRIG 279.0 (H) 05/16/2019    CHOLHDL 6 05/16/2019   Lab Results  Component Value Date   HGBA1C 6.8 (H) 05/16/2021   Lab Results  Component Value Date   VITAMINB12 604 07/23/2020   Lab Results  Component Value Date   TSH 2.33 05/16/2019      ASSESSMENT AND PLAN 88 y.o. year old male  has a past medical history of Aortic stenosis, Arthritis, Bradycardia, Cancer ([place]), CKD (chronic kidney disease), stage III ([place]), Coronary artery disease, Diabetes mellitus without complication ([place]), Diabetic peripheral neuropathy ([place]) (10/04/2019), Diverticulosis, Esophageal reflux, Esophageal stricture from GERD (06/08/2011), Gastric ulcer, Gout, unspecified, History of blood transfusion (02/1964), Hyperlipidemia, Hypertension, Obesity, unspecified, Orthopnea, OSA on CPAP, Paroxysmal nocturnal dyspnea, Persistent atrial fibrillation ([place]), Pneumonia, and Vertigo. here with:  OSA on BiPAP  - BIPAP compliance excellent - Good treatment of AHI  - Encourage patient to use BIPAP nightly and > 4 hours each night - F/U in 1 year or sooner if needed   Ward Givens, MSN, NP-C 12/18/2021, 10:58 AM [facility] [address], [place],  [address] [phone]

## 2021-12-18 NOTE — Patient Instructions (Signed)
Continue using CPAP nightly and greater than 4 hours each night °If your symptoms worsen or you develop new symptoms please let us know.  ° °

## 2021-12-19 DIAGNOSIS — Z23 Encounter for immunization: Secondary | ICD-10-CM | POA: Diagnosis not present

## 2021-12-20 ENCOUNTER — Other Ambulatory Visit: Payer: Self-pay | Admitting: Family Medicine

## 2021-12-20 DIAGNOSIS — M109 Gout, unspecified: Secondary | ICD-10-CM

## 2022-01-06 ENCOUNTER — Ambulatory Visit (INDEPENDENT_AMBULATORY_CARE_PROVIDER_SITE_OTHER): Payer: Medicare Other

## 2022-01-06 DIAGNOSIS — E538 Deficiency of other specified B group vitamins: Secondary | ICD-10-CM | POA: Diagnosis not present

## 2022-01-06 MED ORDER — CYANOCOBALAMIN 1000 MCG/ML IJ SOLN
1000.0000 ug | Freq: Once | INTRAMUSCULAR | Status: AC
  Administered 2022-01-06: 1000 ug via INTRAMUSCULAR

## 2022-01-06 NOTE — Progress Notes (Signed)
Per orders of Dr. Duncan, monthly injection of B12 given by Pricilla Moehle G Aliah Eriksson. Patient tolerated injection well.  

## 2022-01-14 ENCOUNTER — Ambulatory Visit (INDEPENDENT_AMBULATORY_CARE_PROVIDER_SITE_OTHER): Payer: Medicare Other | Admitting: Family

## 2022-01-14 ENCOUNTER — Encounter: Payer: Self-pay | Admitting: Family

## 2022-01-14 DIAGNOSIS — L97511 Non-pressure chronic ulcer of other part of right foot limited to breakdown of skin: Secondary | ICD-10-CM

## 2022-01-14 DIAGNOSIS — I2510 Atherosclerotic heart disease of native coronary artery without angina pectoris: Secondary | ICD-10-CM

## 2022-01-14 DIAGNOSIS — S98131A Complete traumatic amputation of one right lesser toe, initial encounter: Secondary | ICD-10-CM

## 2022-01-14 DIAGNOSIS — Z89421 Acquired absence of other right toe(s): Secondary | ICD-10-CM | POA: Diagnosis not present

## 2022-01-14 DIAGNOSIS — M6701 Short Achilles tendon (acquired), right ankle: Secondary | ICD-10-CM

## 2022-01-14 NOTE — Progress Notes (Signed)
Office Visit Note   Patient: [name]           Date of Birth: [birth year]           MRN: [id number] Visit Date: 01/14/2022              Requested by: Tonia Ghent, MD [address] [place],  [place] [address] PCP: Tonia Ghent, MD  Chief Complaint  Patient presents with   Right Foot - Pain      HPI: The patient is an 88 year old gentleman who presents today concerned for a wound beneath his right foot states that he just noticed drainage today for the first time he does have peripheral neuropathy as well as gout had known callused ulceration beneath the third metatarsal head  Assessment & Plan: Visit Diagnoses: No diagnosis found.  Plan: Debrided with a 10 blade knife back to viable tissue provided donuts for felt pressure relieving pads for his shoe wear minimize weightbearing antibacterial ointment dressing changes daily follow-up in the office in 2 weeks  Follow-Up Instructions: No follow-ups on file.   Ortho Exam  Patient is alert, oriented, no adenopathy, well-dressed, normal affect, normal respiratory effort. On examination of the right foot he does have callused ulceration beneath the third metatarsal head this is 2 cm in diameter after debridement with a 10 blade knife there is open ulceration which is 1 cm in diameter there this does not probe to bone or tendon there is no surrounding erythema no purulence no odor  Imaging: No results found. No images are attached to the encounter.  Labs: Lab Results  Component Value Date   HGBA1C 6.8 (H) 05/16/2021   HGBA1C 7.2 (H) 07/23/2020   HGBA1C 7.7 (H) 09/11/2019   ESRSEDRATE 17 (H) 09/09/2017   CRP 1.1 (H) 09/09/2017   LABURIC 7.1 05/16/2021   LABURIC 7.6 12/16/2020   LABURIC 7.5 07/23/2020   REPTSTATUS 09/19/2017 FINAL 09/14/2017   GRAMSTAIN  09/10/2017    MODERATE WBC PRESENT,BOTH PMN AND MONONUCLEAR NO ORGANISMS SEEN    CULT  09/14/2017    NO GROWTH 5 DAYS Performed at [facility], [place] [address], [place], [place] [address]      Lab Results  Component Value Date   ALBUMIN 4.0 09/17/2020   ALBUMIN 4.2 07/23/2020   ALBUMIN 4.2 05/16/2019    Lab Results  Component Value Date   MG 2.2 09/09/2017   No results found for: "VD25OH"  No results found for: "PREALBUMIN"    Latest Ref Rng & Units 05/16/2021    3:44 PM 01/03/2021   10:56 AM 09/17/2020   11:18 AM  CBC EXTENDED  WBC 3.8 - 10.8 Thousand/uL 5.8  6.7  6.0   RBC 4.20 - 5.80 Million/uL 5.72  4.95  5.79   Hemoglobin 13.2 - 17.1 g/dL 16.1  14.6  17.5   HCT 38.5 - 50.0 % 48.4  44.7  51.3   Platelets  CANCELED  211  136   NEUT# 1,500 - 7,800 cells/uL 3,370   4.1   Lymph# 850 - 3,900 cells/uL 1,520   1.3      There is no height or weight on file to calculate BMI.  Orders:  No orders of the defined types were placed in this encounter.  No orders of the defined types were placed in this encounter.    Procedures: No procedures performed  Clinical Data: No additional findings.  ROS:  All other systems negative, except  as noted in the HPI. Review of Systems  Objective: Vital Signs: There were no vitals taken for this visit.  Specialty Comments:  No specialty comments available.  PMFS History: Patient Active Problem List   Diagnosis Date Noted   Sinusitis 07/27/2021   Sensation of feeling cold 07/17/2021   PND (post-nasal drip) 07/17/2021   Acute non-recurrent frontal sinusitis 05/08/2021   Obstructive sleep apnea treated with bilevel positive airway pressure (BiPAP) 03/19/2021   Lower urinary tract symptoms (LUTS) 09/16/2020   Creatinine elevation 07/31/2020   Drug-induced myopathy 07/31/2020   Diabetic polyneuropathy associated with type 2 diabetes mellitus ([place]) 06/20/2020   Gouty arthritis of left foot 06/20/2020   Diabetic peripheral neuropathy ([place]) 10/04/2019   Healthcare maintenance 05/22/2019   B12 deficiency 05/17/2019   Idiopathic chronic gout, unspecified site,  without tophus (tophi) 31/51/7616   Acute diastolic heart failure (HCC)    CAD S/P percutaneous coronary angioplasty    Chronic atrial fibrillation (HCC)    CKD (chronic kidney disease), stage III ([place])    Hypertension    Hyperlipidemia    Back pain 05/24/2015   ETD (eustachian tube dysfunction) 05/24/2015   Dysphagia, pharyngoesophageal phase 09/25/2014   History of esophageal stricture 09/25/2014   Chronic anticoagulation 09/25/2014   Acute cough 03/14/2014   Esophageal stricture from GERD 06/08/2011   Advance care planning 04/20/2011   OSA on CPAP 09/22/2010   Gout 08/15/2008   OBESITY 08/15/2008   Essential hypertension 08/15/2008   CAD (coronary artery disease), native coronary artery 08/15/2008   GASTROESOPHAGEAL REFLUX DISEASE 08/15/2008   Past Medical History:  Diagnosis Date   Aortic stenosis    Arthritis    Bradycardia    a. nocturnal with pauses overnight on tele likely due to OSA 10/2015.   Cancer ([place])    skin cancer- burned off   CKD (chronic kidney disease), stage III (HCC)    Coronary artery disease    a. prev nonobst. b. LHC 10/24/15: occlusion of small diffusely diseased OM2 s/p balloon angioplasty, mild nonobstructive disease of mLAD and mRCA, normal LVEDP   Diabetes mellitus without complication ([place])    07/37/10 Pt's daughter states patient does not  have diabetes.  A1C 07/23/20 was 7.2, patientr had been on medication.   Diabetic peripheral neuropathy ([place]) 10/04/2019   Diverticulosis    Esophageal reflux    Esophageal stricture from GERD 06/08/2011   With stricture at GE junction on EGD, dilated 06/2011    Gastric ulcer    on EGD 2013   Gout, unspecified    "on daily RX" (09/09/2017)   History of blood transfusion 02/1964   "when I had right little finger cut off"   Hyperlipidemia    Hypertension    Obesity, unspecified    Orthopnea    OSA on CPAP    Paroxysmal nocturnal dyspnea    Persistent atrial fibrillation (HCC)    Pneumonia    Vertigo      Family History  Problem Relation Age of Onset   Stomach cancer Mother        died in 68's    Hypertension Father        died in his 86's pna likely dementia   Dementia Father    Pneumonia Father    Heart disease Brother        S/P CABG   Sleep apnea Brother    Sleep apnea Daughter    Hypertension Other    Prostate cancer Neg Hx    Colon  cancer Neg Hx     Past Surgical History:  Procedure Laterality Date   AMPUTATION Right 03/03/2019   Procedure: RIGHT 2ND TOE AMPUTATION;  Surgeon: [name], MD;  Location: [facility];  Service: Orthopedics;  Laterality: Right;   AMPUTATION Right 01/03/2021   Procedure: RIGHT 3RD TOE AMPUTATION;  Surgeon: [name], MD;  Location: [facility];  Service: Orthopedics;  Laterality: Right;   BALLOON DILATION N/A 10/22/2014   Procedure: BALLOON DILATION;  Surgeon: [name], MD;  [facility];  Service: Endoscopy;  Laterality: N/A;   CARDIAC CATHETERIZATION  07/2008   CARDIAC CATHETERIZATION N/A 10/24/2015   Procedure: Left Heart Cath and Coronary Angiography;  Surgeon: [name], MD;  Location: [facility];  Service: Cardiovascular;  Laterality: N/A;   CARDIAC CATHETERIZATION N/A 10/24/2015   Procedure: Coronary Balloon Angioplasty;  Surgeon: [name], MD;  Location: [facility];  Service: Cardiovascular;  Laterality: N/A;   Carotid Dopplers  09/2009   no sig extracranial stenosis and vertebral arteries had antegrade flow   CATARACT EXTRACTION W/ INTRAOCULAR LENS  IMPLANT, BILATERAL Bilateral    ESOPHAGOGASTRODUODENOSCOPY (EGD) WITH ESOPHAGEAL DILATION     "I've had it stretched 3 times" (10/23/2015)   ESOPHAGOGASTRODUODENOSCOPY (EGD) WITH ESOPHAGEAL DILATION  06/2011   /medical hx above  (09/09/2017)   ESOPHAGOGASTRODUODENOSCOPY (EGD) WITH PROPOFOL N/A 10/22/2014   Procedure: ESOPHAGOGASTRODUODENOSCOPY (EGD) WITH PROPOFOL;  Surgeon: [name], MD;  [facility];  Service: Endoscopy;  Laterality: N/A;    FINGER SURGERY Right 02/1964   "cut little finger off; had it reattached"   I & D EXTREMITY Right 09/10/2017   Procedure: RIGHT FOOT DEBRIDEMENT;  Surgeon: [name], MD;  Location: [facility];  Service: Orthopedics;  Laterality: Right;   MRI of brain  09/2009   chronic microvascular ischemia   OSA Sleep Study     per Dr. 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

## 2022-01-22 ENCOUNTER — Ambulatory Visit: Payer: Medicare Other | Attending: Cardiovascular Disease | Admitting: *Deleted

## 2022-01-22 DIAGNOSIS — I482 Chronic atrial fibrillation, unspecified: Secondary | ICD-10-CM

## 2022-01-22 LAB — POCT INR: INR: 2 (ref 2.0–3.0)

## 2022-01-22 NOTE — Patient Instructions (Signed)
Description   Take an extra 1/2 tablet today and then continue taking Warfarin 1 tablet daily except 1.5 tablets on Sundays and Thursdays. Recheck INR in 6 weeks. Call Coumadin clinic for any changes in medications or up coming procedures. [phone].

## 2022-01-27 ENCOUNTER — Ambulatory Visit (INDEPENDENT_AMBULATORY_CARE_PROVIDER_SITE_OTHER): Payer: Medicare Other | Admitting: Orthopedic Surgery

## 2022-01-27 DIAGNOSIS — L97511 Non-pressure chronic ulcer of other part of right foot limited to breakdown of skin: Secondary | ICD-10-CM | POA: Diagnosis not present

## 2022-01-27 DIAGNOSIS — I2510 Atherosclerotic heart disease of native coronary artery without angina pectoris: Secondary | ICD-10-CM | POA: Diagnosis not present

## 2022-01-28 ENCOUNTER — Telehealth: Payer: Self-pay

## 2022-01-28 NOTE — Telephone Encounter (Signed)
Pt's daughter called to schedule appt for pt to be seen by Dr. Donzetta Matters for RLE ulcer. Pt saw Dr. Sharol Given yesterday who told pt to f/u with [place] with ABIs. Pt offered an appt next week but daughter wants to schedule for the following week.

## 2022-01-30 ENCOUNTER — Ambulatory Visit (HOSPITAL_COMMUNITY): Payer: Medicare Other | Attending: Cardiovascular Disease

## 2022-01-30 DIAGNOSIS — I4819 Other persistent atrial fibrillation: Secondary | ICD-10-CM | POA: Diagnosis not present

## 2022-01-30 DIAGNOSIS — I350 Nonrheumatic aortic (valve) stenosis: Secondary | ICD-10-CM | POA: Insufficient documentation

## 2022-01-30 LAB — ECHOCARDIOGRAM COMPLETE
AR max vel: 0.81 cm2
AV Area VTI: 0.94 cm2
AV Area mean vel: 0.91 cm2
AV Mean grad: 25 mmHg
AV Peak grad: 48.9 mmHg
Ao pk vel: 3.5 m/s
S' Lateral: 2 cm

## 2022-01-30 MED ORDER — PERFLUTREN LIPID MICROSPHERE
1.0000 mL | INTRAVENOUS | Status: AC | PRN
  Administered 2022-01-30: 2 mL via INTRAVENOUS

## 2022-02-03 ENCOUNTER — Encounter: Payer: Self-pay | Admitting: Orthopedic Surgery

## 2022-02-03 NOTE — Progress Notes (Signed)
Office Visit Note   Patient: [name]           Date of Birth: 1930/06/13           MRN: [id number] Visit Date: 01/27/2022              Requested by: Tonia Ghent, MD [address] [place],  [place] [address] PCP: Tonia Ghent, MD  Chief Complaint  Patient presents with   Right Foot - Follow-up      HPI: Patient is an 88 year old gentleman who presents in follow-up for right foot plantar ulcer.  Patient has been using a felt relieving donut and antibiotic ointment.  Assessment & Plan: Visit Diagnoses:  1. Right foot ulcer, limited to breakdown of skin ([place])     Plan: Will follow-up in 4 weeks.  Will have patient go to vascular vein surgery for ankle-brachial indices.  Follow-Up Instructions: Return in about 4 weeks (around 02/24/2022).   Ortho Exam  Patient is alert, oriented, no adenopathy, well-dressed, normal affect, normal respiratory effort. Examination patient has a plantar ulcer.  After informed consent a 10 blade knife was used to debride the ulcer back to healthy granulation tissue.  After debridement the ulcer is 10 mm in diameter 1 mm deep.  Patient has a weakly palpable dorsalis pedis and posterior tibial pulse bilaterally.  The Doppler shows dampened monophasic flow.  Patient does have venous stasis brawny skin color changes on both lower extremities without ulcers.  Imaging: No results found. No images are attached to the encounter.  Labs: Lab Results  Component Value Date   HGBA1C 6.8 (H) 05/16/2021   HGBA1C 7.2 (H) 07/23/2020   HGBA1C 7.7 (H) 09/11/2019   ESRSEDRATE 17 (H) 09/09/2017   CRP 1.1 (H) 09/09/2017   LABURIC 7.1 05/16/2021   LABURIC 7.6 12/16/2020   LABURIC 7.5 07/23/2020   REPTSTATUS 09/19/2017 FINAL 09/14/2017   GRAMSTAIN  09/10/2017    MODERATE WBC PRESENT,BOTH PMN AND MONONUCLEAR NO ORGANISMS SEEN    CULT  09/14/2017    NO GROWTH 5 DAYS Performed at [facility], [place] [address] [place], [place]  [address]      Lab Results  Component Value Date   ALBUMIN 4.0 09/17/2020   ALBUMIN 4.2 07/23/2020   ALBUMIN 4.2 05/16/2019    Lab Results  Component Value Date   MG 2.2 09/09/2017   No results found for: "VD25OH"  No results found for: "PREALBUMIN"    Latest Ref Rng & Units 05/16/2021    3:44 PM 01/03/2021   10:56 AM 09/17/2020   11:18 AM  CBC EXTENDED  WBC 3.8 - 10.8 Thousand/uL 5.8  6.7  6.0   RBC 4.20 - 5.80 Million/uL 5.72  4.95  5.79   Hemoglobin 13.2 - 17.1 g/dL 16.1  14.6  17.5   HCT 38.5 - 50.0 % 48.4  44.7  51.3   Platelets  CANCELED  211  136   NEUT# 1,500 - 7,800 cells/uL 3,370   4.1   Lymph# 850 - 3,900 cells/uL 1,520   1.3      There is no height or weight on file to calculate BMI.  Orders:  Orders Placed This Encounter  Procedures   VAS [place] ABI WITH/WO TBI   No orders of the defined types were placed in this encounter.    Procedures: No procedures performed  Clinical Data: No additional findings.  ROS:  All other systems negative, except as noted in the  HPI. Review of Systems  Objective: Vital Signs: There were no vitals taken for this visit.  Specialty Comments:  No specialty comments available.  PMFS History: Patient Active Problem List   Diagnosis Date Noted   Sinusitis 07/27/2021   Sensation of feeling cold 07/17/2021   PND (post-nasal drip) 07/17/2021   Acute non-recurrent frontal sinusitis 05/08/2021   Obstructive sleep apnea treated with bilevel positive airway pressure (BiPAP) 03/19/2021   Lower urinary tract symptoms (LUTS) 09/16/2020   Creatinine elevation 07/31/2020   Drug-induced myopathy 07/31/2020   Diabetic polyneuropathy associated with type 2 diabetes mellitus ([place]) 06/20/2020   Gouty arthritis of left foot 06/20/2020   Diabetic peripheral neuropathy ([place]) 10/04/2019   Healthcare maintenance 05/22/2019   B12 deficiency 05/17/2019   Idiopathic chronic gout, unspecified site, without tophus (tophi) 75/12/2583   Acute  diastolic heart failure (HCC)    CAD S/P percutaneous coronary angioplasty    Chronic atrial fibrillation (HCC)    CKD (chronic kidney disease), stage III ([place])    Hypertension    Hyperlipidemia    Back pain 05/24/2015   ETD (eustachian tube dysfunction) 05/24/2015   Dysphagia, pharyngoesophageal phase 09/25/2014   History of esophageal stricture 09/25/2014   Chronic anticoagulation 09/25/2014   Acute cough 03/14/2014   Esophageal stricture from GERD 06/08/2011   Advance care planning 04/20/2011   OSA on CPAP 09/22/2010   Gout 08/15/2008   OBESITY 08/15/2008   Essential hypertension 08/15/2008   CAD (coronary artery disease), native coronary artery 08/15/2008   GASTROESOPHAGEAL REFLUX DISEASE 08/15/2008   Past Medical History:  Diagnosis Date   Aortic stenosis    Arthritis    Bradycardia    a. nocturnal with pauses overnight on tele likely due to OSA 10/2015.   Cancer ([place])    skin cancer- burned off   CKD (chronic kidney disease), stage III (HCC)    Coronary artery disease    a. prev nonobst. b. LHC 10/24/15: occlusion of small diffusely diseased OM2 s/p balloon angioplasty, mild nonobstructive disease of mLAD and mRCA, normal LVEDP   Diabetes mellitus without complication ([place])    27/78/24 Pt's daughter states patient does not  have diabetes.  A1C 07/23/20 was 7.2, patientr had been on medication.   Diabetic peripheral neuropathy ([place]) 10/04/2019   Diverticulosis    Esophageal reflux    Esophageal stricture from GERD 06/08/2011   With stricture at GE junction on EGD, dilated 06/2011    Gastric ulcer    on EGD 2013   Gout, unspecified    "on daily RX" (09/09/2017)   History of blood transfusion 02/1964   "when I had right little finger cut off"   Hyperlipidemia    Hypertension    Obesity, unspecified    Orthopnea    OSA on CPAP    Paroxysmal nocturnal dyspnea    Persistent atrial fibrillation (HCC)    Pneumonia    Vertigo     Family History  Problem Relation Age of  Onset   Stomach cancer Mother        died in 4's    Hypertension Father        died in his 68's pna likely dementia   Dementia Father    Pneumonia Father    Heart disease Brother        S/P CABG   Sleep apnea Brother    Sleep apnea Daughter    Hypertension Other    Prostate cancer Neg Hx    Colon cancer Neg Hx  Past Surgical History:  Procedure Laterality Date   AMPUTATION Right 03/03/2019   Procedure: RIGHT 2ND TOE AMPUTATION;  Surgeon: [name], MD;  Location: [facility];  Service: Orthopedics;  Laterality: Right;   AMPUTATION Right 01/03/2021   Procedure: RIGHT 3RD TOE AMPUTATION;  Surgeon: [name], MD;  Location: [facility];  Service: Orthopedics;  Laterality: Right;   BALLOON DILATION N/A 10/22/2014   Procedure: BALLOON DILATION;  Surgeon: [name], MD;  [facility];  Service: Endoscopy;  Laterality: N/A;   CARDIAC CATHETERIZATION  07/2008   CARDIAC CATHETERIZATION N/A 10/24/2015   Procedure: Left Heart Cath and Coronary Angiography;  Surgeon: [name], MD;  Location: [facility];  Service: Cardiovascular;  Laterality: N/A;   CARDIAC CATHETERIZATION N/A 10/24/2015   Procedure: Coronary Balloon Angioplasty;  Surgeon: [name], MD;  Location: [facility];  Service: Cardiovascular;  Laterality: N/A;   Carotid Dopplers  09/2009   no sig extracranial stenosis and vertebral arteries had antegrade flow   CATARACT EXTRACTION W/ INTRAOCULAR LENS  IMPLANT, BILATERAL Bilateral    ESOPHAGOGASTRODUODENOSCOPY (EGD) WITH ESOPHAGEAL DILATION     "I've had it stretched 3 times" (10/23/2015)   ESOPHAGOGASTRODUODENOSCOPY (EGD) WITH ESOPHAGEAL DILATION  06/2011   /medical hx above  (09/09/2017)   ESOPHAGOGASTRODUODENOSCOPY (EGD) WITH PROPOFOL N/A 10/22/2014   Procedure: ESOPHAGOGASTRODUODENOSCOPY (EGD) WITH PROPOFOL;  Surgeon: [name], MD;  [facility];  Service: Endoscopy;  Laterality: N/A;   FINGER SURGERY Right 02/1964   "cut  little finger off; had it reattached"   I & D EXTREMITY Right 09/10/2017   Procedure: RIGHT FOOT DEBRIDEMENT;  Surgeon: [name], MD;  Location: [facility];  Service: Orthopedics;  Laterality: Right;   MRI of brain  09/2009   chronic microvascular ischemia   OSA Sleep Study     per Dr. 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

## 2022-02-04 ENCOUNTER — Ambulatory Visit: Payer: Medicare Other | Admitting: Podiatry

## 2022-02-10 ENCOUNTER — Ambulatory Visit (INDEPENDENT_AMBULATORY_CARE_PROVIDER_SITE_OTHER): Payer: Medicare Other

## 2022-02-10 ENCOUNTER — Telehealth: Payer: Self-pay | Admitting: Family Medicine

## 2022-02-10 DIAGNOSIS — E538 Deficiency of other specified B group vitamins: Secondary | ICD-10-CM | POA: Diagnosis not present

## 2022-02-10 MED ORDER — CYANOCOBALAMIN 1000 MCG/ML IJ SOLN
1000.0000 ug | Freq: Once | INTRAMUSCULAR | Status: AC
  Administered 2022-02-10: 1000 ug via INTRAMUSCULAR

## 2022-02-10 NOTE — Telephone Encounter (Signed)
Please help get patient set up for a yearly visit in early 2024.  We can do labs at the visit.  Thanks.

## 2022-02-10 NOTE — Progress Notes (Signed)
Per orders of Dr. Graham Duncan, injection of Vitamin B 12 given in right deltoid given by Shanda Cadotte. Patient tolerated injection well.  

## 2022-02-11 ENCOUNTER — Ambulatory Visit (HOSPITAL_COMMUNITY)
Admission: RE | Admit: 2022-02-11 | Discharge: 2022-02-11 | Disposition: A | Discharge status: Home / Self Care | Payer: Medicare Other | Source: Ambulatory Visit | Attending: Vascular Surgery | Admitting: Vascular Surgery

## 2022-02-11 ENCOUNTER — Ambulatory Visit (INDEPENDENT_AMBULATORY_CARE_PROVIDER_SITE_OTHER): Payer: Medicare Other | Admitting: Vascular Surgery

## 2022-02-11 ENCOUNTER — Encounter: Payer: Self-pay | Admitting: Vascular Surgery

## 2022-02-11 VITALS — BP 135/80 | HR 79 | Temp 98.2°F | Resp 20 | Ht 66.0 in | Wt 211.0 lb

## 2022-02-11 DIAGNOSIS — E08621 Diabetes mellitus due to underlying condition with foot ulcer: Secondary | ICD-10-CM | POA: Diagnosis not present

## 2022-02-11 DIAGNOSIS — L97511 Non-pressure chronic ulcer of other part of right foot limited to breakdown of skin: Secondary | ICD-10-CM | POA: Insufficient documentation

## 2022-02-11 DIAGNOSIS — L97411 Non-pressure chronic ulcer of right heel and midfoot limited to breakdown of skin: Secondary | ICD-10-CM

## 2022-02-11 NOTE — Progress Notes (Signed)
Patient ID: [name], male   DOB: 11/24/1930, 88 y.o.   MRN: [id number]  Reason for Consult: Follow-up   Referred by Tonia Ghent, MD  Subjective:     HPI:  [name] is a 88 y.o. male without significant history of vascular disease was seen here 3 years ago for ulcer on his right second toe.  He is now status post right second and third toe amputations which have healed well he has risk factors of diabetes, chronic kidney disease, hypertension and hyperlipidemia.  He is a former smoker.  He is on Coumadin for paroxysmal atrial fibrillation.  He now presents for ulceration of his right lower extremity under where his previous toe amputations were on the plantar aspect of his foot without any evidence of infection he states is healing well under Dr. Jess Barters care.  Past Medical History:  Diagnosis Date   Aortic stenosis    Arthritis    Bradycardia    a. nocturnal with pauses overnight on tele likely due to OSA 10/2015.   Cancer ([place])    skin cancer- burned off   CKD (chronic kidney disease), stage III (HCC)    Coronary artery disease    a. prev nonobst. b. LHC 10/24/15: occlusion of small diffusely diseased OM2 s/p balloon angioplasty, mild nonobstructive disease of mLAD and mRCA, normal LVEDP   Diabetes mellitus without complication ([place])    08/65/78 Pt's daughter states patient does not  have diabetes.  A1C 07/23/20 was 7.2, patientr had been on medication.   Diabetic peripheral neuropathy ([place]) 10/04/2019   Diverticulosis    Esophageal reflux    Esophageal stricture from GERD 06/08/2011   With stricture at GE junction on EGD, dilated 06/2011    Gastric ulcer    on EGD 2013   Gout, unspecified    "on daily RX" (09/09/2017)   History of blood transfusion 02/1964   "when I had right little finger cut off"   Hyperlipidemia    Hypertension    Obesity, unspecified    Orthopnea    OSA on CPAP    Paroxysmal nocturnal dyspnea    Persistent atrial fibrillation (HCC)     Pneumonia    Vertigo    Family History  Problem Relation Age of Onset   Stomach cancer Mother        died in 39's    Hypertension Father        died in his 88's pna likely dementia   Dementia Father    Pneumonia Father    Heart disease Brother        S/P CABG   Sleep apnea Brother    Sleep apnea Daughter    Hypertension Other    Prostate cancer Neg Hx    Colon cancer Neg Hx    Past Surgical History:  Procedure Laterality Date   AMPUTATION Right 03/03/2019   Procedure: RIGHT 2ND TOE AMPUTATION;  Surgeon: [name], MD;  Location: [place];  Service: Orthopedics;  Laterality: Right;   AMPUTATION Right 01/03/2021   Procedure: RIGHT 3RD TOE AMPUTATION;  Surgeon: [name], MD;  Location: [place] [facility];  Service: Orthopedics;  Laterality: Right;   BALLOON DILATION N/A 10/22/2014   Procedure: BALLOON DILATION;  Surgeon: [name], MD;  [facility];  Service: Endoscopy;  Laterality: N/A;   CARDIAC CATHETERIZATION  07/2008   CARDIAC CATHETERIZATION N/A 10/24/2015   Procedure: Left Heart Cath and Coronary Angiography;  Surgeon: [name], MD;  Location: [facility];  Service: Cardiovascular;  Laterality: N/A;   CARDIAC CATHETERIZATION N/A 10/24/2015   Procedure: Coronary Balloon Angioplasty;  Surgeon: [name], MD;  Location: [facility];  Service: Cardiovascular;  Laterality: N/A;   Carotid Dopplers  09/2009   no sig extracranial stenosis and vertebral arteries had antegrade flow   CATARACT EXTRACTION W/ INTRAOCULAR LENS  IMPLANT, BILATERAL Bilateral    ESOPHAGOGASTRODUODENOSCOPY (EGD) WITH ESOPHAGEAL DILATION     "I've had it stretched 3 times" (10/23/2015)   ESOPHAGOGASTRODUODENOSCOPY (EGD) WITH ESOPHAGEAL DILATION  06/2011   /medical hx above  (09/09/2017)   ESOPHAGOGASTRODUODENOSCOPY (EGD) WITH PROPOFOL N/A 10/22/2014   Procedure: ESOPHAGOGASTRODUODENOSCOPY (EGD) WITH PROPOFOL;  Surgeon: [name], MD;  [facility];  Service:  Endoscopy;  Laterality: N/A;   FINGER SURGERY Right 02/1964   "cut little finger off; had it reattached"   I & D EXTREMITY Right 09/10/2017   Procedure: RIGHT FOOT DEBRIDEMENT;  Surgeon: [name], MD;  Location: [facility];  Service: Orthopedics;  Laterality: Right;   MRI of brain  09/2009   chronic microvascular ischemia   OSA Sleep Study     per Dr. Brett Fairy   SHOULDER ARTHROSCOPY W/ ROTATOR CUFF REPAIR Right 2008    Short Social History:  Social History   Tobacco Use   Smoking status: Former    Packs/day: 0.12    Years: 4.00    Total pack years: 0.48    Types: Cigarettes    Quit date: 03/16/1969    Years since quitting: 52.9   Smokeless tobacco: Former   Tobacco comments:    "chewed when I smoked; smoked 1 pack/week for about 4 years, quit 1971  Substance Use Topics   Alcohol use: Not Currently    Alcohol/week: 0.0 standard drinks of alcohol    Comment: 09/09/2017  "Quit all alcohol in 1970's"    Allergies  Allergen Reactions   Penicillins Shortness Of Breath and Swelling    Patient tolerated Rocephin injection 2019  PATIENT HAS HAD A PCN REACTION WITH IMMEDIATE RASH, FACIAL/TONGUE/THROAT SWELLING, SOB, OR LIGHTHEADEDNESS WITH HYPOTENSION:  #  #  YES  #  Has patient had a PCN reaction causing severe rash involving mucus membranes or skin necrosis: NO Has patient had a PCN reaction that required hospitalization NO Has patient had a PCN reaction occurring within the last 10 years: NO If all of the above answers are "NO", then may proceed with Cephalosporin use.    Atorvastatin Other (See Comments)    No energy, just felt bad     Pravastatin Other (See Comments)    myalgias   Atrovent Nasal Spray [Ipratropium]     Dizzy with use   Colchicine     Due to renal function- can use prednisone if needed    Hydrocodone Other (See Comments)    "swimmy headed" after use   Nsaids     Due to renal function   Uloric [Febuxostat] Other (See Comments)    Would avoid, worsening  gout sx with use.    Vancomycin Other (See Comments)    Red man syndrome- tolerated with slow run and benadryl use   Clarithromycin Diarrhea and Other (See Comments)    Other reaction(s): Not available   Doxycycline Other (See Comments)    GI upset Other reaction(s): Not available    Current Outpatient Medications  Medication Sig Dispense Refill   acetaminophen (TYLENOL) 500 MG tablet Take 1,000 mg by mouth every 8 (eight)  hours as needed for mild pain.     allopurinol (ZYLOPRIM) 100 MG tablet TAKE 1 TABLET BY MOUTH EVERY DAY 90 tablet 3   chlorhexidine (PERIDEX) 0.12 % solution      colchicine 0.6 MG tablet TAKE 1 TABLET BY MOUTH ONCE DAILY AS NEEDED. CAN TAKE 2ND TABLET ONCE DAILY AS NEEDED FOR GOUT FLARE     cyanocobalamin (,VITAMIN B-12,) 1000 MCG/ML injection 1078mg IM every 28 days 1 mL    Difluprednate (DUREZOL) 0.05 % EMUL      diltiazem (CARDIZEM CD) 180 MG 24 hr capsule Take 1 capsule (180 mg total) by mouth daily. 90 capsule 3   furosemide (LASIX) 20 MG tablet Take 1 tablet (20 mg total) by mouth daily. 90 tablet 3   Influenza vac split quadrivalent PF (FLUZONE HIGH-DOSE) 0.5 ML injection TO BE ADMINISTERED BY PHARMACIST FOR IMMUNIZATION     lisinopril (ZESTRIL) 10 MG tablet Take 1 tablet (10 mg total) by mouth daily. (Patient taking differently: Take 10 mg by mouth in the morning and at bedtime. Per patient taking 1/2 tablet twice a day)     lisinopril (ZESTRIL) 20 MG tablet Take 1 tablet by mouth daily.     lisinopril (ZESTRIL) 40 MG tablet      metoprolol tartrate (LOPRESSOR) 25 MG tablet Take 0.5 tablets (12.5 mg total) by mouth 2 (two) times daily. 90 tablet 3   omeprazole (PRILOSEC) 40 MG capsule Take 1 tablet by mouth daily.     sodium chloride (OCEAN) 0.65 % SOLN nasal spray Place 1 spray into both nostrils as needed for congestion.     tamsulosin (FLOMAX) 0.4 MG CAPS capsule TAKE 1 CAPSULE BY MOUTH EVERY DAY 90 capsule 3   triamcinolone cream (KENALOG) 0.1 % APPLY TO  AFFECTED AREA TWICE A DAY     warfarin (COUMADIN) 4 MG tablet TAKE 1 TO 1 AND 1/2 TABLETS BY MOUTH DAILY OR AS DIRECTED BY COUMADIN CLINIC 120 tablet 1   fluticasone (FLONASE) 50 MCG/ACT nasal spray Place 2 sprays into both nostrils daily. (Patient taking differently: Place 2 sprays into both nostrils as needed.)     No current facility-administered medications for this visit.    Review of Systems  Constitutional:  Constitutional negative. HENT: HENT negative.  Eyes: Eyes negative.  Cardiovascular: Positive for irregular heartbeat.  GI: Gastrointestinal negative.  Musculoskeletal: Musculoskeletal negative.  Skin: Positive for wound.  Neurological: Neurological negative. Hematologic: Hematologic/lymphatic negative.  Psychiatric: Psychiatric negative.        Objective:  Objective  Vitals:   02/11/22 1117  BP: 135/80  Pulse: 79  Resp: 20  Temp: 98.2 F (36.8 C)  SpO2: 95%      Physical Exam HENT:     Head: Normocephalic.     Nose: Nose normal.  Eyes:     Pupils: Pupils are equal, round, and reactive to light.  Neck:     Vascular: No carotid bruit.  Cardiovascular:     Heart sounds: Murmur heard.     Systolic murmur is present with a grade of 4/6.  Pulmonary:     Effort: Pulmonary effort is normal.  Musculoskeletal:     Right lower leg: No edema.     Left lower leg: No edema.     Comments: Well healed right 2nd and 3rd toe amputations  Skin:    Capillary Refill: Capillary refill takes less than 2 seconds.  Neurological:     General: No focal deficit present.     Mental Status:  He is alert.  Psychiatric:        Mood and Affect: Mood normal.        Thought Content: Thought content normal.     Data: ABI Findings:  +---------+------------------+-----+----------+--------+  Right   Rt Pressure (mmHg)IndexWaveform  Comment   +---------+------------------+-----+----------+--------+  Brachial 151                                         +---------+------------------+-----+----------+--------+  PTA     212               1.40 biphasic            +---------+------------------+-----+----------+--------+  DP      211               1.40 monophasicbrisk     +---------+------------------+-----+----------+--------+  Great Toe100               0.66 Abnormal            +---------+------------------+-----+----------+--------+   +---------+------------------+-----+----------+-------+  Left    Lt Pressure (mmHg)IndexWaveform  Comment  +---------+------------------+-----+----------+-------+  Brachial 142                                       +---------+------------------+-----+----------+-------+  PTA     254               1.68 triphasic          +---------+------------------+-----+----------+-------+  DP      254               1.68 monophasic         +---------+------------------+-----+----------+-------+  Great Toe118               0.78 Normal             +---------+------------------+-----+----------+-------+   +-------+-----------+-----------+------------+------------+  ABI/TBIToday's ABIToday's TBIPrevious ABIPrevious TBI  +-------+-----------+-----------+------------+------------+  Right [place]         0.66       1.1         0.68          +-------+-----------+-----------+------------+------------+  Left  [place]         0.78       1.36        0.8           +-------+-----------+-----------+------------+------------+     Summary:  Right: Resting right ankle-brachial index indicates noncompressible right  lower extremity arteries. The right toe-brachial index is abnormal.   Left: Resting left ankle-brachial index indicates noncompressible left  lower extremity arteries. The left toe-brachial index is normal.        Assessment/Plan:    88 year old male with recurrent ulceration of the right lower extremity at the site of previous second and third toe amputations  but with preserved toe pressures and the wound is improving with a callus more consistent with diabetic foot ulceration.  Continue wound care with Dr. Sharol Given and can follow-up with me on an as-needed basis.     Waynetta Sandy MD [facility]

## 2022-02-11 NOTE — Telephone Encounter (Signed)
Daughter will call back to set up

## 2022-02-12 NOTE — Telephone Encounter (Signed)
Daughter will schedule tomorrow 12/1 when she comes in

## 2022-02-12 NOTE — Telephone Encounter (Signed)
Please call daughter to set up patients appt Pickrell,Tabatha Daughter   [phone]

## 2022-02-23 ENCOUNTER — Ambulatory Visit (INDEPENDENT_AMBULATORY_CARE_PROVIDER_SITE_OTHER): Payer: Medicare Other | Admitting: Orthopedic Surgery

## 2022-02-23 ENCOUNTER — Encounter: Payer: Self-pay | Admitting: Orthopedic Surgery

## 2022-02-23 DIAGNOSIS — L97511 Non-pressure chronic ulcer of other part of right foot limited to breakdown of skin: Secondary | ICD-10-CM

## 2022-02-23 DIAGNOSIS — I2510 Atherosclerotic heart disease of native coronary artery without angina pectoris: Secondary | ICD-10-CM | POA: Diagnosis not present

## 2022-02-23 NOTE — Progress Notes (Signed)
Office Visit Note   Patient: [name]           Date of Birth: 03-Mar-1931           MRN: [id number] Visit Date: 02/23/2022              Requested by: Tonia Ghent, MD [address] [place],  [place] [address] PCP: Tonia Ghent, MD  Chief Complaint  Patient presents with   Right Foot - Wound Check      HPI: Patient is an 88 year old gentleman who presents in follow-up for ulceration third metatarsal head right foot.  Patient has a pressure relieving donut in his shoe.  Ankle-brachial indices obtained November 29 showed noncompressible flow at the ankle.  Assessment & Plan: Visit Diagnoses:  1. Right foot ulcer, limited to breakdown of skin ([place])     Plan: Patient was demonstrated and given instructions for Achilles stretching 2 separate ways.  Patient may require gastrocnemius recession.  Follow-Up Instructions: Return in about 4 weeks (around 03/23/2022).   Ortho Exam  Patient is alert, oriented, no adenopathy, well-dressed, normal affect, normal respiratory effort. Examination of the right foot patient does have a palpable dorsalis pedis pulse.  With his knee extended he has dorsiflexion 20 degrees short of neutral with Achilles contracture.  He has a Wagner grade 1 ulcer beneath the third metatarsal head.  After informed consent a 10 blade knife was used debride the skin and soft tissue back to healthy viable tissue.  The ulcer is 2 cm in diameter after debridement there is no exposed bone or tendon no tunneling.  Imaging: No results found. No images are attached to the encounter.  Labs: Lab Results  Component Value Date   HGBA1C 6.8 (H) 05/16/2021   HGBA1C 7.2 (H) 07/23/2020   HGBA1C 7.7 (H) 09/11/2019   ESRSEDRATE 17 (H) 09/09/2017   CRP 1.1 (H) 09/09/2017   LABURIC 7.1 05/16/2021   LABURIC 7.6 12/16/2020   LABURIC 7.5 07/23/2020   REPTSTATUS 09/19/2017 FINAL 09/14/2017   GRAMSTAIN  09/10/2017    MODERATE WBC PRESENT,BOTH PMN AND  MONONUCLEAR NO ORGANISMS SEEN    CULT  09/14/2017    NO GROWTH 5 DAYS Performed at [facility], [place] [address], [place], [place] [address]      Lab Results  Component Value Date   ALBUMIN 4.0 09/17/2020   ALBUMIN 4.2 07/23/2020   ALBUMIN 4.2 05/16/2019    Lab Results  Component Value Date   MG 2.2 09/09/2017   No results found for: "VD25OH"  No results found for: "PREALBUMIN"    Latest Ref Rng & Units 05/16/2021    3:44 PM 01/03/2021   10:56 AM 09/17/2020   11:18 AM  CBC EXTENDED  WBC 3.8 - 10.8 Thousand/uL 5.8  6.7  6.0   RBC 4.20 - 5.80 Million/uL 5.72  4.95  5.79   Hemoglobin 13.2 - 17.1 g/dL 16.1  14.6  17.5   HCT 38.5 - 50.0 % 48.4  44.7  51.3   Platelets  CANCELED  211  136   NEUT# 1,500 - 7,800 cells/uL 3,370   4.1   Lymph# 850 - 3,900 cells/uL 1,520   1.3      There is no height or weight on file to calculate BMI.  Orders:  No orders of the defined types were placed in this encounter.  No orders of the defined types were placed in this encounter.    Procedures: No procedures  performed  Clinical Data: No additional findings.  ROS:  All other systems negative, except as noted in the HPI. Review of Systems  Objective: Vital Signs: There were no vitals taken for this visit.  Specialty Comments:  No specialty comments available.  PMFS History: Patient Active Problem List   Diagnosis Date Noted   Sinusitis 07/27/2021   Sensation of feeling cold 07/17/2021   PND (post-nasal drip) 07/17/2021   Acute non-recurrent frontal sinusitis 05/08/2021   Obstructive sleep apnea treated with bilevel positive airway pressure (BiPAP) 03/19/2021   Lower urinary tract symptoms (LUTS) 09/16/2020   Creatinine elevation 07/31/2020   Drug-induced myopathy 07/31/2020   Diabetic polyneuropathy associated with type 2 diabetes mellitus ([place]) 06/20/2020   Gouty arthritis of left foot 06/20/2020   Diabetic peripheral neuropathy ([place]) 10/04/2019   Healthcare  maintenance 05/22/2019   B12 deficiency 05/17/2019   Idiopathic chronic gout, unspecified site, without tophus (tophi) 56/43/3295   Acute diastolic heart failure (HCC)    CAD S/P percutaneous coronary angioplasty    Chronic atrial fibrillation (HCC)    CKD (chronic kidney disease), stage III ([place])    Hypertension    Hyperlipidemia    Back pain 05/24/2015   ETD (eustachian tube dysfunction) 05/24/2015   Dysphagia, pharyngoesophageal phase 09/25/2014   History of esophageal stricture 09/25/2014   Chronic anticoagulation 09/25/2014   Acute cough 03/14/2014   Esophageal stricture from GERD 06/08/2011   Advance care planning 04/20/2011   OSA on CPAP 09/22/2010   Gout 08/15/2008   OBESITY 08/15/2008   Essential hypertension 08/15/2008   CAD (coronary artery disease), native coronary artery 08/15/2008   GASTROESOPHAGEAL REFLUX DISEASE 08/15/2008   Past Medical History:  Diagnosis Date   Aortic stenosis    Arthritis    Bradycardia    a. nocturnal with pauses overnight on tele likely due to OSA 10/2015.   Cancer ([place])    skin cancer- burned off   CKD (chronic kidney disease), stage III (HCC)    Coronary artery disease    a. prev nonobst. b. LHC 10/24/15: occlusion of small diffusely diseased OM2 s/p balloon angioplasty, mild nonobstructive disease of mLAD and mRCA, normal LVEDP   Diabetes mellitus without complication ([place])    18/84/16 Pt's daughter states patient does not  have diabetes.  A1C 07/23/20 was 7.2, patientr had been on medication.   Diabetic peripheral neuropathy ([place]) 10/04/2019   Diverticulosis    Esophageal reflux    Esophageal stricture from GERD 06/08/2011   With stricture at GE junction on EGD, dilated 06/2011    Gastric ulcer    on EGD 2013   Gout, unspecified    "on daily RX" (09/09/2017)   History of blood transfusion 02/1964   "when I had right little finger cut off"   Hyperlipidemia    Hypertension    Obesity, unspecified    Orthopnea    OSA on CPAP     Paroxysmal nocturnal dyspnea    Persistent atrial fibrillation (HCC)    Pneumonia    Vertigo     Family History  Problem Relation Age of Onset   Stomach cancer Mother        died in 37's    Hypertension Father        died in his 22's pna likely dementia   Dementia Father    Pneumonia Father    Heart disease Brother        S/P CABG   Sleep apnea Brother    Sleep apnea Daughter  Hypertension Other    Prostate cancer Neg Hx    Colon cancer Neg Hx     Past Surgical History:  Procedure Laterality Date   AMPUTATION Right 03/03/2019   Procedure: RIGHT 2ND TOE AMPUTATION;  Surgeon: [name], MD;  Location: [facility];  Service: Orthopedics;  Laterality: Right;   AMPUTATION Right 01/03/2021   Procedure: RIGHT 3RD TOE AMPUTATION;  Surgeon: [name], MD;  Location: [place];  Service: Orthopedics;  Laterality: Right;   BALLOON DILATION N/A 10/22/2014   Procedure: BALLOON DILATION;  Surgeon: [name], MD;  [facility];  Service: Endoscopy;  Laterality: N/A;   CARDIAC CATHETERIZATION  07/2008   CARDIAC CATHETERIZATION N/A 10/24/2015   Procedure: Left Heart Cath and Coronary Angiography;  Surgeon: [name], MD;  Location: [place] [facility];  Service: Cardiovascular;  Laterality: N/A;   CARDIAC CATHETERIZATION N/A 10/24/2015   Procedure: Coronary Balloon Angioplasty;  Surgeon: [name], MD;  Location: [place];  Service: Cardiovascular;  Laterality: N/A;   Carotid Dopplers  09/2009   no sig extracranial stenosis and vertebral arteries had antegrade flow   CATARACT EXTRACTION W/ INTRAOCULAR LENS  IMPLANT, BILATERAL Bilateral    ESOPHAGOGASTRODUODENOSCOPY (EGD) WITH ESOPHAGEAL DILATION     "I've had it stretched 3 times" (10/23/2015)   ESOPHAGOGASTRODUODENOSCOPY (EGD) WITH ESOPHAGEAL DILATION  06/2011   /medical hx above  (09/09/2017)   ESOPHAGOGASTRODUODENOSCOPY (EGD) WITH PROPOFOL N/A 10/22/2014   Procedure: ESOPHAGOGASTRODUODENOSCOPY (EGD) WITH  PROPOFOL;  Surgeon: [name], MD;  [facility];  Service: Endoscopy;  Laterality: N/A;   FINGER SURGERY Right 02/1964   "cut little finger off; had it reattached"   I & D EXTREMITY Right 09/10/2017   Procedure: RIGHT FOOT DEBRIDEMENT;  Surgeon: [name], MD;  Location: [place];  Service: Orthopedics;  Laterality: Right;   MRI of brain  09/2009   chronic microvascular ischemia   OSA Sleep Study     per Dr. 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

## 2022-02-24 ENCOUNTER — Telehealth: Payer: Self-pay | Admitting: Cardiovascular Disease

## 2022-02-24 ENCOUNTER — Encounter: Payer: Self-pay | Admitting: Cardiovascular Disease

## 2022-02-24 ENCOUNTER — Ambulatory Visit: Payer: Medicare Other | Attending: Cardiovascular Disease | Admitting: Cardiovascular Disease

## 2022-02-24 VITALS — BP 120/70 | HR 86 | Ht 68.5 in | Wt 214.0 lb

## 2022-02-24 DIAGNOSIS — I4819 Other persistent atrial fibrillation: Secondary | ICD-10-CM | POA: Diagnosis not present

## 2022-02-24 DIAGNOSIS — I350 Nonrheumatic aortic (valve) stenosis: Secondary | ICD-10-CM | POA: Insufficient documentation

## 2022-02-24 DIAGNOSIS — I25119 Atherosclerotic heart disease of native coronary artery with unspecified angina pectoris: Secondary | ICD-10-CM | POA: Insufficient documentation

## 2022-02-24 DIAGNOSIS — Z01810 Encounter for preprocedural cardiovascular examination: Secondary | ICD-10-CM | POA: Diagnosis not present

## 2022-02-24 NOTE — Progress Notes (Addendum)
Pre Surgical Assessment: 5 M Walk Test  74M=16.56f  5 Meter Walk Test- trial 1: 7.25 seconds 5 Meter Walk Test- trial 2: 7.82 seconds 5 Meter Walk Test- trial 3: 6.89 seconds 5 Meter Walk Test Average: 7.32 seconds  ___________________________   Procedure Type: Isolated AVR Perioperative Outcome Estimate % Operative Mortality 6.59% Morbidity & Mortality 17.8% Stroke 2.26% Renal Failure 3.58% Reoperation 3.08% Prolonged Ventilation 7.22% Deep Sternal Wound Infection 0.101% [facility] [facility] (>14 [facility]) 6.38% Short Hospital Stay (<6 days)* 20.2%

## 2022-02-24 NOTE — Progress Notes (Unsigned)
Cardiology Office Note:    Date:  02/25/2022   ID:  [name], DOB 1930/05/15, MRN [id number]  PCP:  Tonia Ghent, MD   [facility] Cardiologist:  Sherren Mocha, MD     Referring MD: Tonia Ghent, MD   Chief Complaint  Patient presents with   Shortness of Breath    History of Present Illness:    [name] is a 88 y.o. male with a hx of coronary artery disease, permanent atrial fibrillation, and aortic stenosis, presenting for follow-up evaluation.  He was last seen in June 2023 with stable symptoms of mild exertional dyspnea.  At that time an echocardiogram suggested severe paradoxical low-flow low gradient aortic stenosis.  In the setting of his advanced age and stable symptoms, we elected to have him return for 27-monthfollow-up with a repeat echocardiogram.  His recent echocardiogram has demonstrated progressive aortic stenosis, now clearly in the severe range.  See below for discussion.  The patient also reports significant symptoms since the time of his last visit.  He now has worsening shortness of breath, chest discomfort, and lightheadedness.  He notes significant exercise intolerance and complains of shortness of breath with low-level activity such as activities of daily living or light housekeeping.  He denies orthopnea, PND, or significant leg swelling.  He has chest tightness across the front of his chest that can occur at rest or with activity.  He has frequent lightheadedness but has not had frank syncope.  Past Medical History:  Diagnosis Date   Aortic stenosis    Arthritis    Bradycardia    a. nocturnal with pauses overnight on tele likely due to OSA 10/2015.   Cancer ([facility]    skin cancer- burned off   CKD (chronic kidney disease), stage III (HCC)    Coronary artery disease    a. prev nonobst. b. LHC 10/24/15: occlusion of small diffusely diseased OM2 s/p balloon angioplasty, mild nonobstructive disease of mLAD and mRCA, normal  LVEDP   Diabetes mellitus without complication ([facility]    140/98/11Pt's daughter states patient does not  have diabetes.  A1C 07/23/20 was 7.2, patientr had been on medication.   Diabetic peripheral neuropathy ([facility] 10/04/2019   Diverticulosis    Esophageal reflux    Esophageal stricture from GERD 06/08/2011   With stricture at GE junction on EGD, dilated 06/2011    Gastric ulcer    on EGD 2013   Gout, unspecified    "on daily RX" (09/09/2017)   History of blood transfusion 02/1964   "when I had right little finger cut off"   Hyperlipidemia    Hypertension    Obesity, unspecified    Orthopnea    OSA on CPAP    Paroxysmal nocturnal dyspnea    Persistent atrial fibrillation ([facility]    Pneumonia    Vertigo     Past Surgical History:  Procedure Laterality Date   AMPUTATION Right 03/03/2019   Procedure: RIGHT 2ND TOE AMPUTATION;  Surgeon: DNewt Minion MD;  [place] [facility]  Service: Orthopedics;  Laterality: Right;   AMPUTATION Right 01/03/2021   Procedure: RIGHT 3RD TOE AMPUTATION;  Surgeon: DNewt Minion MD;  [place] [facility]  Service: Orthopedics;  Laterality: Right;   BALLOON DILATION N/A 10/22/2014   Procedure: BALLOON DILATION;  Surgeon: CGatha Mayer MD;  [facility];  Service: Endoscopy;  Laterality: N/A;   CARDIAC CATHETERIZATION  07/2008   CARDIAC CATHETERIZATION N/A 10/24/2015  Procedure: Left Heart Cath and Coronary Angiography;  Surgeon: [name], MD;  Location: [facility];  Service: Cardiovascular;  Laterality: N/A;   CARDIAC CATHETERIZATION N/A 10/24/2015   Procedure: Coronary Balloon Angioplasty;  Surgeon: [name], MD;  Location: [facility];  Service: Cardiovascular;  Laterality: N/A;   Carotid Dopplers  09/2009   no sig extracranial stenosis and vertebral arteries had antegrade flow   CATARACT EXTRACTION W/ INTRAOCULAR LENS  IMPLANT, BILATERAL Bilateral    ESOPHAGOGASTRODUODENOSCOPY (EGD) WITH ESOPHAGEAL DILATION     "I've  had it stretched 3 times" (10/23/2015)   ESOPHAGOGASTRODUODENOSCOPY (EGD) WITH ESOPHAGEAL DILATION  06/2011   /medical hx above  (09/09/2017)   ESOPHAGOGASTRODUODENOSCOPY (EGD) WITH PROPOFOL N/A 10/22/2014   Procedure: ESOPHAGOGASTRODUODENOSCOPY (EGD) WITH PROPOFOL;  Surgeon: [name], MD;  [facility];  Service: Endoscopy;  Laterality: N/A;   FINGER SURGERY Right 02/1964   "cut little finger off; had it reattached"   I & D EXTREMITY Right 09/10/2017   Procedure: RIGHT FOOT DEBRIDEMENT;  Surgeon: [name], MD;  Location: [facility];  Service: Orthopedics;  Laterality: Right;   MRI of brain  09/2009   chronic microvascular ischemia   OSA Sleep Study     per Dr. Brett Fairy   SHOULDER ARTHROSCOPY W/ ROTATOR CUFF REPAIR Right 2008    Current Medications: Current Meds  Medication Sig   acetaminophen (TYLENOL) 500 MG tablet Take 1,000 mg by mouth every 8 (eight) hours as needed for mild pain.   allopurinol (ZYLOPRIM) 100 MG tablet TAKE 1 TABLET BY MOUTH EVERY DAY   chlorhexidine (PERIDEX) 0.12 % solution as needed.   colchicine 0.6 MG tablet TAKE 1 TABLET BY MOUTH ONCE DAILY AS NEEDED. CAN TAKE 2ND TABLET ONCE DAILY AS NEEDED FOR GOUT FLARE   cyanocobalamin (,VITAMIN B-12,) 1000 MCG/ML injection 10104mg IM every 28 days   diltiazem (CARDIZEM CD) 180 MG 24 hr capsule Take 1 capsule (180 mg total) by mouth daily.   fluticasone (FLONASE) 50 MCG/ACT nasal spray Place 2 sprays into both nostrils daily. (Patient taking differently: Place 2 sprays into both nostrils as needed.)   furosemide (LASIX) 20 MG tablet Take 1 tablet (20 mg total) by mouth daily.   Influenza vac split quadrivalent PF (FLUZONE HIGH-DOSE) 0.5 ML injection TO BE ADMINISTERED BY PHARMACIST FOR IMMUNIZATION   lisinopril (ZESTRIL) 20 MG tablet Take 10 mg by mouth in the morning and at bedtime.   metoprolol tartrate (LOPRESSOR) 25 MG tablet Take 0.5 tablets (12.5 mg total) by mouth 2 (two) times daily.   omeprazole  (PRILOSEC) 40 MG capsule Take 40 mg by mouth every other day.   tamsulosin (FLOMAX) 0.4 MG CAPS capsule TAKE 1 CAPSULE BY MOUTH EVERY DAY   triamcinolone cream (KENALOG) 0.1 % APPLY TO AFFECTED AREA TWICE A DAY   warfarin (COUMADIN) 4 MG tablet TAKE 1 TO 1 AND 1/2 TABLETS BY MOUTH DAILY OR AS DIRECTED BY COUMADIN CLINIC   [DISCONTINUED] Difluprednate (DUREZOL) 0.05 % EMUL    [DISCONTINUED] omeprazole (PRILOSEC) 40 MG capsule Take 1 tablet by mouth daily.     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  Tobacco Use   Smoking status: Former    Packs/day: 0.12    Years: 4.00    Total pack years: 0.48    Types: Cigarettes    Quit date: 03/16/1969    Years since quitting: 52.9   Smokeless tobacco: Former   Tobacco comments:    "chewed when I smoked; smoked 1 pack/week for about 4 years, quit 1971  Vaping Use   Vaping Use: Never used  Substance and Sexual Activity   Alcohol use: Not Currently    Alcohol/week: 0.0 standard drinks of alcohol    Comment: 09/09/2017  "Quit all alcohol in 1970's"   Drug use: Never   Sexual activity: Not on file  Other Topics Concern   Not on file  Social History Narrative   Lives in [place], married 1953, widowed after 61 years   Retired Administrator, still does Biomedical scientist.   He is not routinely exercising.   Daily caffeine    Social Determinants of Health   Financial Resource Strain: Low Risk  (11/27/2021)   Overall Financial Resource Strain (CARDIA)    Difficulty of Paying Living Expenses: Not hard at all  Food Insecurity:  No Food Insecurity (11/27/2021)   Hunger Vital Sign    Worried About Running Out of Food in the Last Year: Never true    Ran Out of Food in the Last Year: Never true  Transportation Needs: No Transportation Needs (11/27/2021)   PRAPARE - Hydrologist (Medical): No    Lack of Transportation (Non-Medical): No  Physical Activity: Insufficiently Active (11/27/2021)   Exercise Vital Sign    Days of Exercise per Week: 7 days    Minutes of Exercise per Session: 20 min  Stress: No Stress Concern Present (11/27/2021)   [place]    Feeling of Stress : Not at all  Social Connections: Moderately Integrated (11/27/2021)   Social Connection and Isolation Panel [NHANES]    Frequency of Communication with Friends and Family: Twice a week    Frequency of Social Gatherings with Friends and Family: Twice a week    Attends Religious Services: 1 to 4 times per year    Active Member of [employer] or Organizations: No    Attends Music therapist: More than 4 times per year    Marital Status: Widowed     Family History: The patient's family history includes Dementia in his father; Heart disease in his brother; Hypertension in his father and another family member; Pneumonia in his father; Sleep apnea in his brother and daughter; Stomach cancer in his mother. There is no history of Prostate cancer or Colon cancer.  ROS:   Please see the history of present illness.    All other systems reviewed and are negative.  EKGs/Labs/Other Studies Reviewed:    The following studies were reviewed today: Echo 01/30/2022: 1. Left ventricular ejection fraction, by estimation, is 65 to 70%. The  left ventricle has normal function. The left ventricle has no regional  wall motion abnormalities. There is mild left ventricular hypertrophy.  Left ventricular diastolic parameters  are indeterminate.   2. Right ventricular  systolic function is mildly reduced. The right  ventricular size is normal. There is mildly elevated pulmonary artery  systolic pressure. The estimated right ventricular systolic pressure is  27.7 mmHg.   3. Left atrial size was mildly dilated.  4. The mitral valve is degenerative. Trivial mitral valve regurgitation.  Mild mitral stenosis. The mean mitral valve gradient is 4.0 mmHg with  average heart rate of 80 bpm. Severe mitral annular calcification.   5. The inferior vena cava is normal in size with greater than 50%  respiratory variability, suggesting right atrial pressure of 3 mmHg.   6. The aortic valve is calcified. There is severe calcifcation of the  aortic valve. Aortic valve regurgitation is not visualized. Severe aortic  valve stenosis. Vmax 4.0 m/s, MG 35 mmHg, AVA 0.8 cm^2, DI 0.26    EKG:  EKG is ordered today.  The ekg ordered today demonstrates atrial fibrillation 86 bpm, otherwise normal  Recent Labs: 02/24/2022: BUN 13; Creatinine, Ser 1.32; Hemoglobin WILL FOLLOW; Platelets WILL FOLLOW; Potassium 4.2; Sodium 140  Recent Lipid Panel    Component Value Date/Time   CHOL 166 05/16/2019 0923   TRIG 279.0 (H) 05/16/2019 0923   HDL 26.40 (L) 05/16/2019 0923   CHOLHDL 6 05/16/2019 0923   VLDL 55.8 (H) 05/16/2019 0923   LDLCALC 82 10/24/2015 0430   LDLDIRECT 95.0 05/16/2019 0923     Risk Assessment/Calculations:    CHA2DS2-VASc Score = 6   This indicates a 9.7% annual risk of stroke. The patient's score is based upon: CHF History: 1 HTN History: 1 Diabetes History: 1 Stroke History: 0 Vascular Disease History: 1 Age Score: 2 Gender Score: 0               Physical Exam:    VS:  BP 120/70   Pulse 86   Ht 5' 8.5" (1.74 m)   Wt 214 lb (97.1 kg)   SpO2 96%   BMI 32.07 kg/m     Wt Readings from Last 3 Encounters:  02/24/22 214 lb (97.1 kg)  02/11/22 211 lb (95.7 kg)  12/18/21 213 lb 12.8 oz (97 kg)     GEN:  Well nourished, well developed  obese elderly male in no acute distress HEENT: Normal NECK: No JVD; No carotid bruits LYMPHATICS: No lymphadenopathy CARDIAC: RRR, 3/6 harsh late peaking systolic crescendo decrescendo murmur at the right upper sternal border with diminished A2 RESPIRATORY:  Clear to auscultation without rales, wheezing or rhonchi  ABDOMEN: Soft, non-tender, non-distended MUSCULOSKELETAL: Trace bilateral pretibial edema; No deformity  SKIN: Warm and dry NEUROLOGIC:  Alert and oriented x 3 PSYCHIATRIC:  Normal affect   ASSESSMENT:    1. Persistent atrial fibrillation ([place])   2. Nonrheumatic aortic valve stenosis   3. Coronary artery disease involving native coronary artery of native heart with angina pectoris ([place])   4. Pre-procedural cardiovascular examination    PLAN:    In order of problems listed above:  The patient's heart rate is controlled.  He is anticoagulated with warfarin.  Continue metoprolol at the current dose for rate control. The patient has developed progressive symptoms and echo findings consistent with severe, stage D1 aortic stenosis.  His echo demonstrates severe calcification and restriction of the aortic valve leaflets.  I have personally reviewed these images.  The V-max is now 4 m/s with a mean gradient of 35 mmHg and calculated aortic valve area of 0.8 cm.  Dimensionless index is 0.26.  He reports progressive shortness of breath, chest discomfort, and dizziness.  He now has significant exercise intolerance with a functional class III limitation.  I reviewed the natural history of aortic stenosis with the patient and his daughter who is present today.  They understand that treatment options consist  of conventional surgery, TAVR, and palliative medical therapy.  He strongly desires to be treated.  TAVR would be a good option for him at his age of 88 years old as long as he has suitable cardiac and vascular anatomy.  He will be referred for cardiac catheterization and a gated CTA of the  heart as well as a CTA of the chest, abdomen, and pelvis. I have reviewed the risks, indications, and alternatives to cardiac catheterization, possible angioplasty, and stenting with the patient. Risks include but are not limited to bleeding, infection, vascular injury, stroke, myocardial infection, arrhythmia, kidney injury, radiation-related injury in the case of prolonged fluoroscopy use, emergency cardiac surgery, and death. The patient understands the risks of serious complication is 1-2 in [id number] with diagnostic cardiac cath and 1-2% or less with angioplasty/stenting.  Once his cardiac catheterization and CTA studies are completed, he will be referred for formal cardiac surgical evaluation as part of a multidisciplinary team approach to his care. Stable, no angina. Scheduling for cath as above           Medication Adjustments/Labs and Tests Ordered: Current medicines are reviewed at length with the patient today.  Concerns regarding medicines are outlined above.  Orders Placed This Encounter  Procedures   CBC   Basic metabolic panel   EKG 95-KDTO   No orders of the defined types were placed in this encounter.   Patient Instructions  Medication Instructions:  **Will call with Lovenox bridging information and send Rx to pharmacy** *If you need a refill on your cardiac medications before your next appointment, please call your pharmacy*   Lab Work: CBC, BMET today  **Keep INR appt on 03/05/22 If you have labs (blood work) drawn today and your tests are completely normal, you will receive your results only by: [place] (if you have MyChart) OR A paper copy in the mail If you have any lab test that is abnormal or we need to change your treatment, we will call you to review the results.   Testing/Procedures: R & L heart catheterization Your physician has requested that you have a cardiac catheterization. Cardiac catheterization is used to diagnose and/or treat various heart  conditions. Doctors may recommend this procedure for a number of different reasons. The most common reason is to evaluate chest pain. Chest pain can be a symptom of coronary artery disease (CAD), and cardiac catheterization can show whether plaque is narrowing or blocking your heart's arteries. This procedure is also used to evaluate the valves, as well as measure the blood flow and oxygen levels in different parts of your heart. For further information please visit [email or web address]. Please follow instruction sheet, as given.  Ambulatory referral to TCTS (you will be called to schedule)  TAVR CT's (you will be called to schedule)  Follow-Up: At [facility], you and your health needs are our priority.  As part of our continuing mission to provide you with exceptional heart care, we have created designated Provider Care Teams.  These Care Teams include your primary Cardiologist (physician) and Advanced Practice Providers (APPs -  Physician Assistants and Nurse Practitioners) who all work together to provide you with the care you need, when you need it.  We recommend signing up for the patient portal called "MyChart".  Sign up information is provided on this After Visit Summary.  MyChart is used to connect with patients for Virtual Visits (Telemedicine).  Patients are able to view lab/test results, encounter notes, upcoming appointments, etc.  Non-urgent  messages can be sent to your provider as well.   To learn more about what you can do with MyChart, go to [email or web address].    Your next appointment:   Structural Team will follow-up  The format for your next appointment:   In Person  Provider:   Sherren Mocha, MD       Other Instructions       Cardiac/Peripheral Catheterization   You are scheduled for a Cardiac Catheterization on Wednesday, December 27 with Dr. Sherren Mocha.  1. Please arrive at the Main Entrance A at [facility]: [place], [place] [id number] on December 27 at 6:30 AM (This time is two hours before your procedure to ensure your preparation). Free valet parking service is available. You will check in at ADMITTING. The support person will be asked to wait in the waiting room.  It is OK to have someone drop you off and come back when you are ready to be discharged.        Special note: Every effort is made to have your procedure done on time. Please understand that emergencies sometimes delay scheduled procedures.   . 2. Diet: Do not eat solid foods after midnight.  You may have clear liquids until 5 AM the day of the procedure.  3. Labs: Thursday 03/05/22 @ [facility] office for INR check  4. Medication instructions in preparation for your procedure:   Contrast Allergy: No  Stop taking Coumadin (Warfarin) on Friday, December 22. DO NOT TAKE Furosemide or Tamsulosin the morning of procedure  On the morning of your procedure, take Aspirin 81 mg and any morning medicines NOT listed above.  You may use sips of water.  5. Plan to go home the same day, you will only stay overnight if medically necessary. 6. You MUST have a responsible adult to drive you home. 7. An adult MUST be with you the first 24 hours after you arrive home. 8. Bring a current list of your medications, and the last time and date medication taken. 9. Bring ID and current insurance cards. 10.Please wear clothes that are easy to get on and off and wear slip-on shoes.  Thank you for allowing [place] to care for you!   -- [facility]   Important Information About Sugar         Signed, Sherren Mocha, MD  02/25/2022 9:29 PM    [place]

## 2022-02-24 NOTE — H&P (View-Only) (Signed)
Cardiology Office Note:    Date:  02/25/2022   ID:  [name], DOB 11-Aug-1930, MRN [id number]  PCP:  Tonia Ghent, MD   [place] Prov[facility] Cardiologist:  Sherren Mocha, MD     Referring MD: Tonia Ghent, MD   Chief Complaint  Patient presents with   Shortness of Breath    History of Present Illness:    [name] is a 88 y.o. male with a hx of coronary artery disease, permanent atrial fibrillation, and aortic stenosis, presenting for follow-up evaluation.  He was last seen in June 2023 with stable symptoms of mild exertional dyspnea.  At that time an echocardiogram suggested severe paradoxical low-flow low gradient aortic stenosis.  In the setting of his advanced age and stable symptoms, we elected to have him return for 30-monthfollow-up with a repeat echocardiogram.  His recent echocardiogram has demonstrated progressive aortic stenosis, now clearly in the severe range.  See below for discussion.  The patient also reports significant symptoms since the time of his last visit.  He now has worsening shortness of breath, chest discomfort, and lightheadedness.  He notes significant exercise intolerance and complains of shortness of breath with low-level activity such as activities of daily living or light housekeeping.  He denies orthopnea, PND, or significant leg swelling.  He has chest tightness across the front of his chest that can occur at rest or with activity.  He has frequent lightheadedness but has not had frank syncope.  Past Medical History:  Diagnosis Date   Aortic stenosis    Arthritis    Bradycardia    a. nocturnal with pauses overnight on tele likely due to OSA 10/2015.   [place]    skin cancer- burned off   CKD (chronic kidney disease), stage III (HCC)    Coronary artery disease    a. prev nonobst. b. LHC 10/24/15: occlusion of small diffusely diseased OM2 s/p balloon angioplasty, mild nonobstructive disease of mLAD and mRCA, normal  LVEDP   Diabetes mellitus without complication ([place]    169/48/54Pt's daughter states patient does not  have diabetes.  A1C 07/23/20 was 7.2, patientr had been on medication.   Diabetic peripheral neuropathy ([place] 10/04/2019   Diverticulosis    Esophageal reflux    Esophageal stricture from GERD 06/08/2011   With stricture at GE junction on EGD, dilated 06/2011    Gastric ulcer    on EGD 2013   Gout, unspecified    "on daily RX" (09/09/2017)   History of blood transfusion 02/1964   "when I had right little finger cut off"   Hyperlipidemia    Hypertension    Obesity, unspecified    Orthopnea    OSA on CPAP    Paroxysmal nocturnal dyspnea    Persistent atrial fibrillation ([place]    Pneumonia    Vertigo     Past Surgical History:  Procedure Laterality Date   AMPUTATION Right 03/03/2019   Procedure: RIGHT 2ND TOE AMPUTATION;  Surgeon: DNewt Minion MD;  [facility]  Service: Orthopedics;  Laterality: Right;   AMPUTATION Right 01/03/2021   Procedure: RIGHT 3RD TOE AMPUTATION;  Surgeon: DNewt Minion MD;  [facility]  Service: Orthopedics;  Laterality: Right;   BALLOON DILATION N/A 10/22/2014   Procedure: BALLOON DILATION;  Surgeon: CGatha Mayer MD;  [facility];  Service: Endoscopy;  Laterality: N/A;   CARDIAC CATHETERIZATION  07/2008   CARDIAC CATHETERIZATION N/A 10/24/2015  Procedure: Left Heart Cath and Coronary Angiography;  Surgeon: [name], MD;  Location: [place] [facility];  Service: Cardiovascular;  Laterality: N/A;   CARDIAC CATHETERIZATION N/A 10/24/2015   Procedure: Coronary Balloon Angioplasty;  Surgeon: [name], MD;  Location: [place] [facility];  Service: Cardiovascular;  Laterality: N/A;   Carotid Dopplers  09/2009   no sig extracranial stenosis and vertebral arteries had antegrade flow   CATARACT EXTRACTION W/ INTRAOCULAR LENS  IMPLANT, BILATERAL Bilateral    ESOPHAGOGASTRODUODENOSCOPY (EGD) WITH ESOPHAGEAL DILATION     "I've  had it stretched 3 times" (10/23/2015)   ESOPHAGOGASTRODUODENOSCOPY (EGD) WITH ESOPHAGEAL DILATION  06/2011   /medical hx above  (09/09/2017)   ESOPHAGOGASTRODUODENOSCOPY (EGD) WITH PROPOFOL N/A 10/22/2014   Procedure: ESOPHAGOGASTRODUODENOSCOPY (EGD) WITH PROPOFOL;  Surgeon: [name], MD;  [facility];  Service: Endoscopy;  Laterality: N/A;   FINGER SURGERY Right 02/1964   "cut little finger off; had it reattached"   I & D EXTREMITY Right 09/10/2017   Procedure: RIGHT FOOT DEBRIDEMENT;  Surgeon: [name], MD;  Location: [facility];  Service: Orthopedics;  Laterality: Right;   MRI of brain  09/2009   chronic microvascular ischemia   OSA Sleep Study     per Dr. Brett Fairy   SHOULDER ARTHROSCOPY W/ ROTATOR CUFF REPAIR Right 2008    Current Medications: Current Meds  Medication Sig   acetaminophen (TYLENOL) 500 MG tablet Take 1,000 mg by mouth every 8 (eight) hours as needed for mild pain.   allopurinol (ZYLOPRIM) 100 MG tablet TAKE 1 TABLET BY MOUTH EVERY DAY   chlorhexidine (PERIDEX) 0.12 % solution as needed.   colchicine 0.6 MG tablet TAKE 1 TABLET BY MOUTH ONCE DAILY AS NEEDED. CAN TAKE 2ND TABLET ONCE DAILY AS NEEDED FOR GOUT FLARE   cyanocobalamin (,VITAMIN B-12,) 1000 MCG/ML injection 1085mg IM every 28 days   diltiazem (CARDIZEM CD) 180 MG 24 hr capsule Take 1 capsule (180 mg total) by mouth daily.   fluticasone (FLONASE) 50 MCG/ACT nasal spray Place 2 sprays into both nostrils daily. (Patient taking differently: Place 2 sprays into both nostrils as needed.)   furosemide (LASIX) 20 MG tablet Take 1 tablet (20 mg total) by mouth daily.   Influenza vac split quadrivalent PF (FLUZONE HIGH-DOSE) 0.5 ML injection TO BE ADMINISTERED BY PHARMACIST FOR IMMUNIZATION   lisinopril (ZESTRIL) 20 MG tablet Take 10 mg by mouth in the morning and at bedtime.   metoprolol tartrate (LOPRESSOR) 25 MG tablet Take 0.5 tablets (12.5 mg total) by mouth 2 (two) times daily.   omeprazole  (PRILOSEC) 40 MG capsule Take 40 mg by mouth every other day.   tamsulosin (FLOMAX) 0.4 MG CAPS capsule TAKE 1 CAPSULE BY MOUTH EVERY DAY   triamcinolone cream (KENALOG) 0.1 % APPLY TO AFFECTED AREA TWICE A DAY   warfarin (COUMADIN) 4 MG tablet TAKE 1 TO 1 AND 1/2 TABLETS BY MOUTH DAILY OR AS DIRECTED BY COUMADIN CLINIC   [DISCONTINUED] Difluprednate (DUREZOL) 0.05 % EMUL    [DISCONTINUED] omeprazole (PRILOSEC) 40 MG capsule Take 1 tablet by mouth daily.     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  Tobacco Use   Smoking status: Former    Packs/day: 0.12    Years: 4.00    Total pack years: 0.48    Types: Cigarettes    Quit date: 03/16/1969    Years since quitting: 52.9   Smokeless tobacco: Former   Tobacco comments:    "chewed when I smoked; smoked 1 pack/week for about 4 years, quit 1971  Vaping Use   Vaping Use: Never used  Substance and Sexual Activity   Alcohol use: Not Currently    Alcohol/week: 0.0 standard drinks of alcohol    Comment: 09/09/2017  "Quit all alcohol in 1970's"   Drug use: Never   Sexual activity: Not on file  Other Topics Concern   Not on file  Social History Narrative   Lives in [place], married 1953, widowed after 8 years   Retired Administrator, still does Biomedical scientist.   He is not routinely exercising.   Daily caffeine    Social Determinants of Health   Financial Resource Strain: Low Risk  (11/27/2021)   Overall Financial Resource Strain (CARDIA)    Difficulty of Paying Living Expenses: Not hard at all  Food Insecurity:  No Food Insecurity (11/27/2021)   Hunger Vital Sign    Worried About Running Out of Food in the Last Year: Never true    Ran Out of Food in the Last Year: Never true  Transportation Needs: No Transportation Needs (11/27/2021)   PRAPARE - Hydrologist (Medical): No    Lack of Transportation (Non-Medical): No  Physical Activity: Insufficiently Active (11/27/2021)   Exercise Vital Sign    Days of Exercise per Week: 7 days    Minutes of Exercise per Session: 20 min  Stress: No Stress Concern Present (11/27/2021)   [place]    Feeling of Stress : Not at all  Social Connections: Moderately Integrated (11/27/2021)   Social Connection and Isolation Panel [NHANES]    Frequency of Communication with Friends and Family: Twice a week    Frequency of Social Gatherings with Friends and Family: Twice a week    Attends Religious Services: 1 to 4 times per year    Active Member of [employer] or Organizations: No    Attends Music therapist: More than 4 times per year    Marital Status: Widowed     Family History: The patient's family history includes Dementia in his father; Heart disease in his brother; Hypertension in his father and another family member; Pneumonia in his father; Sleep apnea in his brother and daughter; Stomach cancer in his mother. There is no history of Prostate cancer or Colon cancer.  ROS:   Please see the history of present illness.    All other systems reviewed and are negative.  EKGs/Labs/Other Studies Reviewed:    The following studies were reviewed today: Echo 01/30/2022: 1. Left ventricular ejection fraction, by estimation, is 65 to 70%. The  left ventricle has normal function. The left ventricle has no regional  wall motion abnormalities. There is mild left ventricular hypertrophy.  Left ventricular diastolic parameters  are indeterminate.   2. Right ventricular  systolic function is mildly reduced. The right  ventricular size is normal. There is mildly elevated pulmonary artery  systolic pressure. The estimated right ventricular systolic pressure is  24.4 mmHg.   3. Left atrial size was mildly dilated.  4. The mitral valve is degenerative. Trivial mitral valve regurgitation.  Mild mitral stenosis. The mean mitral valve gradient is 4.0 mmHg with  average heart rate of 80 bpm. Severe mitral annular calcification.   5. The inferior vena cava is normal in size with greater than 50%  respiratory variability, suggesting right atrial pressure of 3 mmHg.   6. The aortic valve is calcified. There is severe calcifcation of the  aortic valve. Aortic valve regurgitation is not visualized. Severe aortic  valve stenosis. Vmax 4.0 m/s, MG 35 mmHg, AVA 0.8 cm^2, DI 0.26    EKG:  EKG is ordered today.  The ekg ordered today demonstrates atrial fibrillation 86 bpm, otherwise normal  Recent Labs: 02/24/2022: BUN 13; Creatinine, Ser 1.32; Hemoglobin WILL FOLLOW; Platelets WILL FOLLOW; Potassium 4.2; Sodium 140  Recent Lipid Panel    Component Value Date/Time   CHOL 166 05/16/2019 0923   TRIG 279.0 (H) 05/16/2019 0923   HDL 26.40 (L) 05/16/2019 0923   CHOLHDL 6 05/16/2019 0923   VLDL 55.8 (H) 05/16/2019 0923   LDLCALC 82 10/24/2015 0430   LDLDIRECT 95.0 05/16/2019 0923     Risk Assessment/Calculations:    CHA2DS2-VASc Score = 6   This indicates a 9.7% annual risk of stroke. The patient's score is based upon: CHF History: 1 HTN History: 1 Diabetes History: 1 Stroke History: 0 Vascular Disease History: 1 Age Score: 2 Gender Score: 0               Physical Exam:    VS:  BP 120/70   Pulse 86   Ht 5' 8.5" (1.74 m)   Wt 214 lb (97.1 kg)   SpO2 96%   BMI 32.07 kg/m     Wt Readings from Last 3 Encounters:  02/24/22 214 lb (97.1 kg)  02/11/22 211 lb (95.7 kg)  12/18/21 213 lb 12.8 oz (97 kg)     GEN:  Well nourished, well developed  obese elderly male in no acute distress HEENT: Normal NECK: No JVD; No carotid bruits LYMPHATICS: No lymphadenopathy CARDIAC: RRR, 3/6 harsh late peaking systolic crescendo decrescendo murmur at the right upper sternal border with diminished A2 RESPIRATORY:  Clear to auscultation without rales, wheezing or rhonchi  ABDOMEN: Soft, non-tender, non-distended MUSCULOSKELETAL: Trace bilateral pretibial edema; No deformity  SKIN: Warm and dry NEUROLOGIC:  Alert and oriented x 3 PSYCHIATRIC:  Normal affect   ASSESSMENT:    1. Persistent atrial fibrillation ([place])   2. Nonrheumatic aortic valve stenosis   3. Coronary artery disease involving native coronary artery of native heart with angina pectoris ([place])   4. Pre-procedural cardiovascular examination    PLAN:    In order of problems listed above:  The patient's heart rate is controlled.  He is anticoagulated with warfarin.  Continue metoprolol at the current dose for rate control. The patient has developed progressive symptoms and echo findings consistent with severe, stage D1 aortic stenosis.  His echo demonstrates severe calcification and restriction of the aortic valve leaflets.  I have personally reviewed these images.  The V-max is now 4 m/s with a mean gradient of 35 mmHg and calculated aortic valve area of 0.8 cm.  Dimensionless index is 0.26.  He reports progressive shortness of breath, chest discomfort, and dizziness.  He now has significant exercise intolerance with a functional class III limitation.  I reviewed the natural history of aortic stenosis with the patient and his daughter who is present today.  They understand that treatment options consist  of conventional surgery, TAVR, and palliative medical therapy.  He strongly desires to be treated.  TAVR would be a good option for him at his age of 87 years old as long as he has suitable cardiac and vascular anatomy.  He will be referred for cardiac catheterization and a gated CTA of the  heart as well as a CTA of the chest, abdomen, and pelvis. I have reviewed the risks, indications, and alternatives to cardiac catheterization, possible angioplasty, and stenting with the patient. Risks include but are not limited to bleeding, infection, vascular injury, stroke, myocardial infection, arrhythmia, kidney injury, radiation-related injury in the case of prolonged fluoroscopy use, emergency cardiac surgery, and death. The patient understands the risks of serious complication is 1-2 in 5366 with diagnostic cardiac cath and 1-2% or less with angioplasty/stenting.  Once his cardiac catheterization and CTA studies are completed, he will be referred for formal cardiac surgical evaluation as part of a multidisciplinary team approach to his care. Stable, no angina. Scheduling for cath as above           Medication Adjustments/Labs and Tests Ordered: Current medicines are reviewed at length with the patient today.  Concerns regarding medicines are outlined above.  Orders Placed This Encounter  Procedures   CBC   Basic metabolic panel   EKG [id number]   No orders of the defined types were placed in this encounter.   Patient Instructions  Medication Instructions:  **Will call with Lovenox bridging information and send Rx to pharmacy** *If you need a refill on your cardiac medications before your next appointment, please call your pharmacy*   Lab Work: CBC, BMET today  **Keep INR appt on 03/05/22 If you have labs (blood work) drawn today and your tests are completely normal, you will receive your results only by: [place] (if you have MyChart) OR A paper copy in the mail If you have any lab test that is abnormal or we need to change your treatment, we will call you to review the results.   Testing/Procedures: R & L heart catheterization Your physician has requested that you have a cardiac catheterization. Cardiac catheterization is used to diagnose and/or treat various heart  conditions. Doctors may recommend this procedure for a number of different reasons. The most common reason is to evaluate chest pain. Chest pain can be a symptom of coronary artery disease (CAD), and cardiac catheterization can show whether plaque is narrowing or blocking your heart's arteries. This procedure is also used to evaluate the valves, as well as measure the blood flow and oxygen levels in different parts of your heart. For further information please visit HugeFiesta.[email or web address]. Please follow instruction sheet, as given.  Ambulatory referral to TCTS (you will be called to schedule)  TAVR CT's (you will be called to schedule)  Follow-Up: At [facility], you and your health needs are our priority.  As part of our continuing mission to provide you with exceptional heart care, we have created designated Provider Care Teams.  These Care Teams include your primary Cardiologist (physician) and Advanced Practice Providers (APPs -  Physician Assistants and Nurse Practitioners) who all work together to provide you with the care you need, when you need it.  We recommend signing up for the patient portal called "MyChart".  Sign up information is provided on this After Visit Summary.  MyChart is used to connect with patients for Virtual Visits (Telemedicine).  Patients are able to view lab/test results, encounter notes, upcoming appointments, etc.  Non-urgent  messages can be sent to your provider as well.   To learn more about what you can do with MyChart, go to [email or web address].    Your next appointment:   Structural Team will follow-up  The format for your next appointment:   In Person  Provider:   Sherren Mocha, MD       Other Instructions       Cardiac/Peripheral Catheterization   You are scheduled for a Cardiac Catheterization on Wednesday, December 27 with Dr. Sherren Mocha.  1. Please arrive at the Main Entrance A at [facility]: [place], [place] [address] on December 27 at 6:30 AM (This time is two hours before your procedure to ensure your preparation). Free valet parking service is available. You will check in at ADMITTING. The support person will be asked to wait in the waiting room.  It is OK to have someone drop you off and come back when you are ready to be discharged.        Special note: Every effort is made to have your procedure done on time. Please understand that emergencies sometimes delay scheduled procedures.   . 2. Diet: Do not eat solid foods after midnight.  You may have clear liquids until 5 AM the day of the procedure.  3. Labs: Thursday 03/05/22 @ [facility] office for INR check  4. Medication instructions in preparation for your procedure:   Contrast Allergy: No  Stop taking Coumadin (Warfarin) on Friday, December 22. DO NOT TAKE Furosemide or Tamsulosin the morning of procedure  On the morning of your procedure, take Aspirin 81 mg and any morning medicines NOT listed above.  You may use sips of water.  5. Plan to go home the same day, you will only stay overnight if medically necessary. 6. You MUST have a responsible adult to drive you home. 7. An adult MUST be with you the first 24 hours after you arrive home. 8. Bring a current list of your medications, and the last time and date medication taken. 9. Bring ID and current insurance cards. 10.Please wear clothes that are easy to get on and off and wear slip-on shoes.  Thank you for allowing [place] to care for you!   -- [place] [facility]   Important Information About Sugar         Signed, Sherren Mocha, MD  02/25/2022 9:29 PM    [place]

## 2022-02-24 NOTE — Telephone Encounter (Signed)
Daughter was returning call, stated that she got disconnected. Please advise

## 2022-02-24 NOTE — Patient Instructions (Signed)
Medication Instructions:  **Will call with Lovenox bridging information and send Rx to pharmacy** *If you need a refill on your cardiac medications before your next appointment, please call your pharmacy*   Lab Work: CBC, BMET today  **Keep INR appt on 03/05/22 If you have labs (blood work) drawn today and your tests are completely normal, you will receive your results only by: [place] (if you have MyChart) OR A paper copy in the mail If you have any lab test that is abnormal or we need to change your treatment, we will call you to review the results.   Testing/Procedures: R & L heart catheterization Your physician has requested that you have a cardiac catheterization. Cardiac catheterization is used to diagnose and/or treat various heart conditions. Doctors may recommend this procedure for a number of different reasons. The most common reason is to evaluate chest pain. Chest pain can be a symptom of coronary artery disease (CAD), and cardiac catheterization can show whether plaque is narrowing or blocking your heart's arteries. This procedure is also used to evaluate the valves, as well as measure the blood flow and oxygen levels in different parts of your heart. For further information please visit HugeFiesta.[email or web address]. Please follow instruction sheet, as given.  Ambulatory referral to TCTS (you will be called to schedule)  TAVR CT's (you will be called to schedule)  Follow-Up: At [facility], you and your health needs are our priority.  As part of our continuing mission to provide you with exceptional heart care, we have created designated Provider Care Teams.  These Care Teams include your primary Cardiologist (physician) and Advanced Practice Providers (APPs -  Physician Assistants and Nurse Practitioners) who all work together to provide you with the care you need, when you need it.  We recommend signing up for the patient portal called "MyChart".  Sign up information  is provided on this After Visit Summary.  MyChart is used to connect with patients for Virtual Visits (Telemedicine).  Patients are able to view lab/test results, encounter notes, upcoming appointments, etc.  Non-urgent messages can be sent to your provider as well.   To learn more about what you can do with MyChart, go to [email or web address].    Your next appointment:   Structural Team will follow-up  The format for your next appointment:   In Person  Provider:   Sherren Mocha, MD       Other Instructions       Cardiac/Peripheral Catheterization   You are scheduled for a Cardiac Catheterization on Wednesday, December 27 with Dr. Sherren Mocha.  1. Please arrive at the Main Entrance A at [facility]: [place], [place] [address] on December 27 at 6:30 AM (This time is two hours before your procedure to ensure your preparation). Free valet parking service is available. You will check in at ADMITTING. The support person will be asked to wait in the waiting room.  It is OK to have someone drop you off and come back when you are ready to be discharged.        Special note: Every effort is made to have your procedure done on time. Please understand that emergencies sometimes delay scheduled procedures.   . 2. Diet: Do not eat solid foods after midnight.  You may have clear liquids until 5 AM the day of the procedure.  3. Labs: Thursday 03/05/22 @ [facility] office for INR check  4. Medication instructions in preparation for your procedure:  Contrast Allergy: No  Stop taking Coumadin (Warfarin) on Friday, December 22. DO NOT TAKE Furosemide or Tamsulosin the morning of procedure  On the morning of your procedure, take Aspirin 81 mg and any morning medicines NOT listed above.  You may use sips of water.  5. Plan to go home the same day, you will only stay overnight if medically necessary. 6. You MUST have a responsible adult to drive you home. 7. An  adult MUST be with you the first 24 hours after you arrive home. 8. Bring a current list of your medications, and the last time and date medication taken. 9. Bring ID and current insurance cards. 10.Please wear clothes that are easy to get on and off and wear slip-on shoes.  Thank you for allowing [place] to care for you!   -- [place] [facility]   Important Information About Sugar

## 2022-02-25 NOTE — Telephone Encounter (Signed)
Returned call to daughter and spoke-was previously disconnected.

## 2022-02-26 LAB — CBC
Hematocrit: 49.1 % (ref 37.5–51.0)
Hemoglobin: 16.5 g/dL (ref 13.0–17.7)
MCH: 29.6 pg (ref 26.6–33.0)
MCHC: 33.6 g/dL (ref 31.5–35.7)
MCV: 88 fL (ref 79–97)
RBC: 5.58 x10E6/uL (ref 4.14–5.80)
RDW: 13.7 % (ref 11.6–15.4)
WBC: 7.3 10*3/uL (ref 3.4–10.8)

## 2022-02-26 LAB — BASIC METABOLIC PANEL
BUN/Creatinine Ratio: 10 (ref 10–24)
BUN: 13 mg/dL (ref 8–27)
CO2: 24 mmol/L (ref 20–29)
Calcium: 9.6 mg/dL (ref 8.6–10.2)
Chloride: 100 mmol/L (ref 96–106)
Creatinine, Ser: 1.32 mg/dL — ABNORMAL HIGH (ref 0.76–1.27)
Glucose: 81 mg/dL (ref 70–99)
Potassium: 4.2 mmol/L (ref 3.5–5.2)
Sodium: 140 mmol/L (ref 134–144)
eGFR: 52 mL/min/{1.73_m2} — ABNORMAL LOW (ref >59)

## 2022-03-02 ENCOUNTER — Other Ambulatory Visit: Payer: Self-pay

## 2022-03-02 DIAGNOSIS — I350 Nonrheumatic aortic (valve) stenosis: Secondary | ICD-10-CM

## 2022-03-03 ENCOUNTER — Telehealth: Payer: Self-pay | Admitting: Cardiovascular Disease

## 2022-03-03 NOTE — Telephone Encounter (Signed)
Patient's daughter calling to see if the patient can be worked in for a coumadin appointment this afternoon. She says her sister was supposed to take him 12/21, but can't. She says she is off today and wants to see if he can be worked in. Phone: [phone]

## 2022-03-03 NOTE — Telephone Encounter (Signed)
Spoke with dtr and was able to change appt until tomorrow. Advised that Friday would not be a good date since pt needs lovenox bridging. She was not aware of the lovenox bridging that he needs and states it's because the other sister usually brings him  but she is sick. She will be the one assisting with the injections and requested 2 copies of instructions on tomorrow. Placed a note on appt regarding this.

## 2022-03-04 ENCOUNTER — Telehealth: Payer: Self-pay | Admitting: *Deleted

## 2022-03-04 ENCOUNTER — Other Ambulatory Visit: Payer: Self-pay | Admitting: *Deleted

## 2022-03-04 ENCOUNTER — Ambulatory Visit: Payer: Medicare Other | Attending: Cardiovascular Disease | Admitting: *Deleted

## 2022-03-04 DIAGNOSIS — Z01812 Encounter for preprocedural laboratory examination: Secondary | ICD-10-CM

## 2022-03-04 DIAGNOSIS — I350 Nonrheumatic aortic (valve) stenosis: Secondary | ICD-10-CM

## 2022-03-04 DIAGNOSIS — Z5181 Encounter for therapeutic drug level monitoring: Secondary | ICD-10-CM

## 2022-03-04 DIAGNOSIS — I482 Chronic atrial fibrillation, unspecified: Secondary | ICD-10-CM

## 2022-03-04 LAB — POCT INR: INR: 2.2 (ref 2.0–3.0)

## 2022-03-04 MED ORDER — ENOXAPARIN SODIUM 150 MG/ML IJ SOSY: 150.0000 mg | PREFILLED_SYRINGE | INTRAMUSCULAR | 1 refills | Status: DC

## 2022-03-04 NOTE — Telephone Encounter (Signed)
Platelet count not done on pre-procedure CBC 02/24/22.  Call placed to patient to let him know he should go to lab for platelet count when he goes to [facility] appt this afternoon-order has been placed for platelet count.

## 2022-03-04 NOTE — Patient Instructions (Addendum)
  Description   Continue taking Warfarin 1 tablet daily except 1.5 tablets on 'Sundays and Thursdays. Refer to pt instructions for upcoming procedure. Recheck INR in 1 week post procedure. Call Coumadin clinic for any changes in medications or up coming procedures. [phone].      12'$ /21/23: Last dose of warfarin.  03/06/22: No warfarin or enoxaparin (Lovenox).  03/07/22: Inject enoxaparin 150 mg in the fatty abdominal tissue at least 2 inches from the belly button at 8am rotate sites. No warfarin.  03/08/22: Inject enoxaparin in the fatty tissue at 8am. No warfarin.  03/09/22:  Inject enoxaparin in the fatty tissue at 8am. No warfarin.  03/10/22:  Inject enoxaparin in the fatty tissue at 8am. No warfarin.  03/11/22: Procedure Day - No enoxaparin - Resume warfarin in the evening or as directed by doctor (take an extra half tablet with usual dose).  03/12/22: Resume enoxaparin inject in the fatty tissue at 8am and take warfarin (take an extra half tablet with usual dose).  03/13/22: Inject enoxaparin in the fatty tissue at 8am and take warfarin  03/14/22: Inject enoxaparin in the fatty tissue at 8am and take warfarin  03/15/22: Inject enoxaparin in the fatty tissue at 8am and take warfarin  03/16/21: Inject enoxaparin in the fatty tissue at 8am and take warfarin  03/17/21: Inject enoxaparin in the fatty tissue at 8am and report to warfarin appt to check INR.

## 2022-03-06 LAB — PLATELET COUNT

## 2022-03-10 ENCOUNTER — Telehealth: Payer: Self-pay | Admitting: *Deleted

## 2022-03-10 NOTE — Telephone Encounter (Signed)
Cardiac Catheterization scheduled at [facility] for: Wednesday March 11, 2022 8:30 AM Arrival time and place: [facility] A at: 6:30 AM  Nothing to eat after midnight prior to procedure, clear liquids until 5 AM day of procedure.  Medication instructions: -Hold:  Warfarin/Lovenox bridge-see 03/04/22 Anti-coag visit-pt reports he has been following these instructions  Lasix/Lisinopril-day before and day of procedure-per protocol GFR 52 -Except hold medications usual morning medications can be taken with sips of water including aspirin 81 mg.  Confirmed patient has responsible adult to drive home post procedure and be with patient first 24 hours after arriving home.  Patient reports no new symptoms concerning for COVID-19 in the past 10 days.  Reviewed procedure instructions with patient.

## 2022-03-10 NOTE — Telephone Encounter (Signed)
02/24/22 and 03/04/22 Platelet count cancelled-see lab results for comments. Reviewed with Dr Burt Knack 03/06/22- Per Dr Percell Belt to proceed with Cardiac Cath 03/11/22.

## 2022-03-11 ENCOUNTER — Other Ambulatory Visit (HOSPITAL_COMMUNITY): Payer: Self-pay

## 2022-03-11 ENCOUNTER — Other Ambulatory Visit: Payer: Self-pay

## 2022-03-11 ENCOUNTER — Encounter (HOSPITAL_COMMUNITY)
Admission: RE | Disposition: A | Discharge status: Home / Self Care | Payer: Self-pay | Source: Home / Self Care | Attending: Cardiovascular Disease

## 2022-03-11 ENCOUNTER — Ambulatory Visit (HOSPITAL_COMMUNITY)
Admission: RE | Admit: 2022-03-11 | Discharge: 2022-03-11 | Disposition: A | Discharge status: Home / Self Care | Payer: Medicare Other | Attending: Cardiovascular Disease | Admitting: Cardiovascular Disease

## 2022-03-11 DIAGNOSIS — I25118 Atherosclerotic heart disease of native coronary artery with other forms of angina pectoris: Secondary | ICD-10-CM | POA: Diagnosis present

## 2022-03-11 DIAGNOSIS — Z87891 Personal history of nicotine dependence: Secondary | ICD-10-CM | POA: Diagnosis not present

## 2022-03-11 DIAGNOSIS — I252 Old myocardial infarction: Secondary | ICD-10-CM | POA: Diagnosis not present

## 2022-03-11 DIAGNOSIS — E1122 Type 2 diabetes mellitus with diabetic chronic kidney disease: Secondary | ICD-10-CM | POA: Insufficient documentation

## 2022-03-11 DIAGNOSIS — I4819 Other persistent atrial fibrillation: Secondary | ICD-10-CM | POA: Insufficient documentation

## 2022-03-11 DIAGNOSIS — I350 Nonrheumatic aortic (valve) stenosis: Secondary | ICD-10-CM | POA: Diagnosis not present

## 2022-03-11 DIAGNOSIS — N183 Chronic kidney disease, stage 3 unspecified: Secondary | ICD-10-CM | POA: Diagnosis not present

## 2022-03-11 DIAGNOSIS — Z7901 Long term (current) use of anticoagulants: Secondary | ICD-10-CM | POA: Insufficient documentation

## 2022-03-11 DIAGNOSIS — Z01812 Encounter for preprocedural laboratory examination: Secondary | ICD-10-CM

## 2022-03-11 DIAGNOSIS — Z79899 Other long term (current) drug therapy: Secondary | ICD-10-CM | POA: Diagnosis not present

## 2022-03-11 DIAGNOSIS — Z955 Presence of coronary angioplasty implant and graft: Secondary | ICD-10-CM | POA: Diagnosis not present

## 2022-03-11 DIAGNOSIS — I129 Hypertensive chronic kidney disease with stage 1 through stage 4 chronic kidney disease, or unspecified chronic kidney disease: Secondary | ICD-10-CM | POA: Insufficient documentation

## 2022-03-11 DIAGNOSIS — E785 Hyperlipidemia, unspecified: Secondary | ICD-10-CM | POA: Insufficient documentation

## 2022-03-11 HISTORY — PX: RIGHT/LEFT HEART CATH AND CORONARY ANGIOGRAPHY: CATH118266

## 2022-03-11 HISTORY — PX: CORONARY STENT INTERVENTION: CATH118234

## 2022-03-11 LAB — POCT I-STAT 7, (LYTES, BLD GAS, ICA,H+H)
Acid-base deficit: 1 mmol/L (ref 0.0–2.0)
Acid-base deficit: 1 mmol/L (ref 0.0–2.0)
Bicarbonate: 24.8 mmol/L (ref 20.0–28.0)
Bicarbonate: 25.6 mmol/L (ref 20.0–28.0)
Calcium, Ion: 1.17 mmol/L (ref 1.15–1.40)
Calcium, Ion: 1.19 mmol/L (ref 1.15–1.40)
HCT: 45 % (ref 39.0–52.0)
HCT: 45 % (ref 39.0–52.0)
Hemoglobin: 15.3 g/dL (ref 13.0–17.0)
Hemoglobin: 15.3 g/dL (ref 13.0–17.0)
O2 Saturation: 82 %
O2 Saturation: 89 %
Potassium: 3.9 mmol/L (ref 3.5–5.1)
Potassium: 4 mmol/L (ref 3.5–5.1)
Sodium: 139 mmol/L (ref 135–145)
Sodium: 141 mmol/L (ref 135–145)
TCO2: 26 mmol/L (ref 22–32)
TCO2: 27 mmol/L (ref 22–32)
pCO2 arterial: 44.3 mmHg (ref 32–48)
pCO2 arterial: 48.2 mmHg — ABNORMAL HIGH (ref 32–48)
pH, Arterial: 7.333 — ABNORMAL LOW (ref 7.35–7.45)
pH, Arterial: 7.356 (ref 7.35–7.45)
pO2, Arterial: 50 mmHg — ABNORMAL LOW (ref 83–108)
pO2, Arterial: 59 mmHg — ABNORMAL LOW (ref 83–108)

## 2022-03-11 LAB — POCT I-STAT EG7
Acid-Base Excess: 0 mmol/L (ref 0.0–2.0)
Acid-Base Excess: 0 mmol/L (ref 0.0–2.0)
Bicarbonate: 26.4 mmol/L (ref 20.0–28.0)
Bicarbonate: 26.9 mmol/L (ref 20.0–28.0)
Calcium, Ion: 1.1 mmol/L — ABNORMAL LOW (ref 1.15–1.40)
Calcium, Ion: 1.11 mmol/L — ABNORMAL LOW (ref 1.15–1.40)
HCT: 44 % (ref 39.0–52.0)
HCT: 45 % (ref 39.0–52.0)
Hemoglobin: 15 g/dL (ref 13.0–17.0)
Hemoglobin: 15.3 g/dL (ref 13.0–17.0)
O2 Saturation: 52 %
O2 Saturation: 58 %
Potassium: 3.7 mmol/L (ref 3.5–5.1)
Potassium: 3.8 mmol/L (ref 3.5–5.1)
Sodium: 141 mmol/L (ref 135–145)
Sodium: 143 mmol/L (ref 135–145)
TCO2: 28 mmol/L (ref 22–32)
TCO2: 28 mmol/L (ref 22–32)
pCO2, Ven: 50.1 mmHg (ref 44–60)
pCO2, Ven: 51.9 mmHg (ref 44–60)
pH, Ven: 7.323 (ref 7.25–7.43)
pH, Ven: 7.329 (ref 7.25–7.43)
pO2, Ven: 30 mmHg — CL (ref 32–45)
pO2, Ven: 33 mmHg (ref 32–45)

## 2022-03-11 LAB — CBC
HCT: 49.6 % (ref 39.0–52.0)
Hemoglobin: 17.1 g/dL — ABNORMAL HIGH (ref 13.0–17.0)
MCH: 29.8 pg (ref 26.0–34.0)
MCHC: 34.5 g/dL (ref 30.0–36.0)
MCV: 86.4 fL (ref 80.0–100.0)
Platelets: 169 10*3/uL (ref 150–400)
RBC: 5.74 MIL/uL (ref 4.22–5.81)
RDW: 14.1 % (ref 11.5–15.5)
WBC: 6.4 10*3/uL (ref 4.0–10.5)
nRBC: 0.3 % — ABNORMAL HIGH (ref 0.0–0.2)

## 2022-03-11 LAB — POCT ACTIVATED CLOTTING TIME: Activated Clotting Time: 260 seconds

## 2022-03-11 LAB — PROTIME-INR
INR: 1.1 (ref 0.8–1.2)
Prothrombin Time: 14.1 seconds (ref 11.4–15.2)

## 2022-03-11 SURGERY — RIGHT/LEFT HEART CATH AND CORONARY ANGIOGRAPHY
Anesthesia: LOCAL

## 2022-03-11 MED ORDER — HYDRALAZINE HCL 20 MG/ML IJ SOLN: 10.0000 mg | INTRAMUSCULAR | Status: DC | PRN

## 2022-03-11 MED ORDER — FENTANYL CITRATE (PF) 100 MCG/2ML IJ SOLN
INTRAMUSCULAR | Status: AC
  Filled 2022-03-11: qty 2

## 2022-03-11 MED ORDER — SODIUM CHLORIDE 0.9% FLUSH: 3.0000 mL | Freq: Two times a day (BID) | INTRAVENOUS | Status: DC

## 2022-03-11 MED ORDER — VERAPAMIL HCL 2.5 MG/ML IV SOLN
INTRAVENOUS | Status: DC | PRN
  Administered 2022-03-11: 10 mL via INTRA_ARTERIAL

## 2022-03-11 MED ORDER — HEPARIN (PORCINE) IN NACL 1000-0.9 UT/500ML-% IV SOLN
INTRAVENOUS | Status: AC
  Filled 2022-03-11: qty 500

## 2022-03-11 MED ORDER — NITROGLYCERIN 1 MG/10 ML FOR IR/CATH LAB
INTRA_ARTERIAL | Status: AC
  Filled 2022-03-11: qty 10

## 2022-03-11 MED ORDER — SODIUM CHLORIDE 0.9 % WEIGHT BASED INFUSION: 1.0000 mL/kg/h | INTRAVENOUS | Status: DC

## 2022-03-11 MED ORDER — NITROGLYCERIN 1 MG/10 ML FOR IR/CATH LAB
INTRA_ARTERIAL | Status: DC | PRN
  Administered 2022-03-11: 100 ug via INTRACORONARY

## 2022-03-11 MED ORDER — HEPARIN (PORCINE) IN NACL 1000-0.9 UT/500ML-% IV SOLN
INTRAVENOUS | Status: DC | PRN
  Administered 2022-03-11 (×2): 500 mL

## 2022-03-11 MED ORDER — CLOPIDOGREL BISULFATE 300 MG PO TABS
ORAL_TABLET | ORAL | Status: AC
  Filled 2022-03-11: qty 1

## 2022-03-11 MED ORDER — MIDAZOLAM HCL 2 MG/2ML IJ SOLN
INTRAMUSCULAR | Status: AC
  Filled 2022-03-11: qty 2

## 2022-03-11 MED ORDER — MIDAZOLAM HCL 2 MG/2ML IJ SOLN
INTRAMUSCULAR | Status: DC | PRN
  Administered 2022-03-11 (×2): 1 mg via INTRAVENOUS

## 2022-03-11 MED ORDER — HEPARIN SODIUM (PORCINE) 1000 UNIT/ML IJ SOLN
INTRAMUSCULAR | Status: DC | PRN
  Administered 2022-03-11 (×2): 5000 [IU] via INTRAVENOUS
  Administered 2022-03-11: 2000 [IU] via INTRAVENOUS

## 2022-03-11 MED ORDER — IOHEXOL 350 MG/ML SOLN
INTRAVENOUS | Status: DC | PRN
  Administered 2022-03-11: 85 mL

## 2022-03-11 MED ORDER — CLOPIDOGREL BISULFATE 75 MG PO TABS: 75.0000 mg | ORAL_TABLET | Freq: Every day | ORAL | 0 refills | Status: DC

## 2022-03-11 MED ORDER — CLOPIDOGREL BISULFATE 75 MG PO TABS
75.0000 mg | ORAL_TABLET | Freq: Every day | ORAL | 5 refills | Status: DC
  Filled 2022-03-11 (×2): qty 30, 30d supply, fill #0

## 2022-03-11 MED ORDER — LIDOCAINE HCL (PF) 1 % IJ SOLN
INTRAMUSCULAR | Status: AC
  Filled 2022-03-11: qty 30

## 2022-03-11 MED ORDER — LIDOCAINE HCL (PF) 1 % IJ SOLN
INTRAMUSCULAR | Status: DC | PRN
  Administered 2022-03-11 (×2): 2 mL via INTRADERMAL

## 2022-03-11 MED ORDER — HEPARIN SODIUM (PORCINE) 1000 UNIT/ML IJ SOLN
INTRAMUSCULAR | Status: AC
  Filled 2022-03-11: qty 10

## 2022-03-11 MED ORDER — CLOPIDOGREL BISULFATE 300 MG PO TABS
ORAL_TABLET | ORAL | Status: DC | PRN
  Administered 2022-03-11: 600 mg via ORAL

## 2022-03-11 MED ORDER — ONDANSETRON HCL 4 MG/2ML IJ SOLN: 4.0000 mg | Freq: Four times a day (QID) | INTRAMUSCULAR | Status: DC | PRN

## 2022-03-11 MED ORDER — VERAPAMIL HCL 2.5 MG/ML IV SOLN
INTRAVENOUS | Status: AC
  Filled 2022-03-11: qty 2

## 2022-03-11 MED ORDER — LABETALOL HCL 5 MG/ML IV SOLN: 10.0000 mg | INTRAVENOUS | Status: DC | PRN

## 2022-03-11 MED ORDER — CLOPIDOGREL BISULFATE 75 MG PO TABS: 75.0000 mg | ORAL_TABLET | Freq: Every day | ORAL | Status: DC

## 2022-03-11 MED ORDER — SODIUM CHLORIDE 0.9 % WEIGHT BASED INFUSION
3.0000 mL/kg/h | INTRAVENOUS | Status: AC
  Administered 2022-03-11: 3 mL/kg/h via INTRAVENOUS

## 2022-03-11 MED ORDER — PANTOPRAZOLE SODIUM 40 MG PO TBEC
40.0000 mg | DELAYED_RELEASE_TABLET | Freq: Every day | ORAL | 1 refills | Status: DC
  Filled 2022-03-11: qty 30, 30d supply, fill #0

## 2022-03-11 MED ORDER — ASPIRIN 81 MG PO CHEW: 81.0000 mg | CHEWABLE_TABLET | ORAL | Status: DC

## 2022-03-11 MED ORDER — SODIUM CHLORIDE 0.9 % IV SOLN: 250.0000 mL | INTRAVENOUS | Status: DC | PRN

## 2022-03-11 MED ORDER — SODIUM CHLORIDE 0.9% FLUSH: 3.0000 mL | INTRAVENOUS | Status: DC | PRN

## 2022-03-11 MED ORDER — ACETAMINOPHEN 325 MG PO TABS: 650.0000 mg | ORAL_TABLET | ORAL | Status: DC | PRN

## 2022-03-11 MED ORDER — FENTANYL CITRATE (PF) 100 MCG/2ML IJ SOLN
INTRAMUSCULAR | Status: DC | PRN
  Administered 2022-03-11 (×2): 25 ug via INTRAVENOUS

## 2022-03-11 MED ORDER — SODIUM CHLORIDE 0.9 % IV SOLN: INTRAVENOUS | Status: AC

## 2022-03-11 SURGICAL SUPPLY — 21 items
BALLN EMERGE MR 2.5X15 (BALLOONS) ×1
BALLN ~~LOC~~ EMERGE MR 3.25X15 (BALLOONS) ×1
BALLOON EMERGE MR 2.5X15 (BALLOONS) IMPLANT
BALLOON ~~LOC~~ EMERGE MR 3.25X15 (BALLOONS) IMPLANT
CATH 5FR JL3.5 JR4 ANG PIG MP (CATHETERS) IMPLANT
CATH LAUNCHER 6FR JR4 (CATHETERS) IMPLANT
CATH SWAN GANZ 7F STRAIGHT (CATHETERS) IMPLANT
DEVICE RAD COMP TR BAND LRG (VASCULAR PRODUCTS) IMPLANT
ELECT DEFIB PAD ADLT CADENCE (PAD) IMPLANT
GLIDESHEATH SLEND SS 6F .021 (SHEATH) IMPLANT
GLIDESHEATH SLENDER 7FR .021G (SHEATH) IMPLANT
GUIDEWIRE INQWIRE 1.5J.035X260 (WIRE) IMPLANT
INQWIRE 1.5J .035X260CM (WIRE) ×1
KIT ENCORE 26 ADVANTAGE (KITS) IMPLANT
KIT HEART LEFT (KITS) ×1 IMPLANT
PACK CARDIAC CATHETERIZATION (CUSTOM PROCEDURE TRAY) ×1 IMPLANT
STENT SYNERGY XD 3.0X24 (Permanent Stent) IMPLANT
SYNERGY XD 3.0X24 (Permanent Stent) ×1 IMPLANT
TRANSDUCER W/STOPCOCK (MISCELLANEOUS) ×1 IMPLANT
TUBING CIL FLEX 10 FLL-RA (TUBING) ×1 IMPLANT
WIRE COUGAR XT STRL 190CM (WIRE) IMPLANT

## 2022-03-11 NOTE — Progress Notes (Signed)
Pt was educated on stent card, stent location, DAPT use, wt restrictions, no baths/daily wash-ups, s/s of infection, ex guidelines (walking as tolerated), s/s to stop exercising, NTG use and calling 911, heart healthy diet, risk factors (High LDLs), and CRPII. Pt received materials on exercise, diet, and CRPII. Will refer to [facility].    Pt will be able to do CRPII after TAVR procedure.   Christen Bame 03/11/2022 2:12 PM   [phone]

## 2022-03-11 NOTE — Discharge Summary (Signed)
Discharge Summary for Same Day PCI   Patient ID: [name] MRN: [id number]; DOB: 1931-02-19  Admit date: 03/11/2022 Discharge date: 03/11/2022  Primary Care Provider: Tonia Ghent, MD  Primary Cardiologist: Sherren Mocha, MD  Primary Electrophysiologist:  None   Discharge Diagnoses    Active Problems:   Coronary artery disease with exertional angina [facility])   Severe aortic stenosis  Diagnostic Studies/Procedures    Cardiac Catheterization 03/11/2022:    Mid RCA lesion is 15% stenosed.   Ost 2nd Mrg to 2nd Mrg lesion is 50% stenosed.   2nd Mrg lesion is 90% stenosed.   Mid Cx lesion is 70% stenosed.   Prox RCA lesion is 90% stenosed.   Prox LAD to Mid LAD lesion is 50% stenosed.   A drug-eluting stent was successfully placed using a SYNERGY XD 3.0X24.   Post intervention, there is a 0% residual stenosis.   There is severe aortic valve stenosis.   1.  Severe aortic stenosis with mean transaortic gradient 42 mmHg, calculated aortic valve area 0.6 cm 2.  Two-vessel coronary artery disease with severe stenosis of the proximal RCA, treated with PCI using a 3.0 x 24 mm Synergy DES and severe stenosis of the OM branch of the circumflex with recommendations for medical therapy due to lesion complexity 3.  Moderate nonobstructive diffuse proximal to mid LAD stenosis estimated at 50%   Recommendations: Resume warfarin today, resume Lovenox bridging tomorrow, clopidogrel 75 mg daily concurrent with warfarin anticoagulation for 6 months.  Will withhold aspirin due to bleeding risk of triple therapy in this 88 year old gentleman.  Same-day PCI discharge protocol if criteria met.  Diagnostic Dominance: Right  Intervention   _____________   History of Present Illness     [name] is a 88 y.o. male with past medical history of CAD s/p balloon angioplasty of OM2, nonobstructive disease of the LAD, RCA, diabetes, hypertension, hyperlipidemia, permanent atrial  fibrillation and aortic stenosis who was recently seen in the office on 12/12.  He was previously seen back in June with stable symptoms of mild exertional dyspnea.  At that time echocardiogram suggested severe paradoxical low-flow low gradient aortic stenosis.  Plan was to have him return in 6 months with repeat echocardiogram which demonstrated progressive aortic stenosis in the severe range.  Also reported significant progression of symptoms since his last visit back in June.  It was recommended that he undergo outpatient cardiac catheterization as part of TAVR workup.  Hospital Course     The patient underwent cardiac cath as noted above with two-vessel CAD with severe stenosis of proximal RCA treated with PCI/DES x 1 as well as severe stenosis of OM branch of circumflex with recommendations for medical therapy due to lesion complexity.  Severe aortic stenosis. Plan to resume Coumadin post cath with Lovenox bridging resumed 12/28.  Placed on Plavix as monotherapy given the need for Coumadin. He was instructed to resume coumadin this afternoon, lovenox to start tomorrow. The patient was seen by cardiac rehab while in short stay. There were no observed complications post cath. Radial cath site was re-evaluated prior to discharge and found to be stable without any complications. Instructions/precautions regarding cath site care were given prior to discharge.  [name] was seen by Dr. Burt Knack and determined stable for discharge home. Follow up with our office has been arranged. Medications are listed below. Pertinent changes include addition of plavix. Has follow up coumadin clinic visit on 1/2. Instructed to resume coumadin dosing/lovenox  as directed by the coumadin clinic instructions noted below.   03/11/22: Procedure Day - No enoxaparin - Resume warfarin in the evening or as directed by doctor (take an extra half tablet with usual dose).   03/12/22: Resume enoxaparin inject in the fatty tissue at  8am and take warfarin (take an extra half tablet with usual dose).   03/13/22: Inject enoxaparin in the fatty tissue at 8am and take warfarin   03/14/22: Inject enoxaparin in the fatty tissue at 8am and take warfarin   03/15/22: Inject enoxaparin in the fatty tissue at 8am and take warfarin   03/16/21: Inject enoxaparin in the fatty tissue at 8am and take warfarin   03/17/21: Inject enoxaparin in the fatty tissue at 8am and report to warfarin appt to check INR.  _____________  Cath/PCI Registry Performance & Quality Measures: Aspirin prescribed? - No - need for coumadin ADP Receptor Inhibitor (Plavix/Clopidogrel, Brilinta/Ticagrelor or Effient/Prasugrel) prescribed (includes medically managed patients)? - Yes High Intensity Statin (Lipitor 40-9m or Crestor 20-452m prescribed? - No - intolerant  For EF <40%, was ACEI/ARB prescribed? - Not Applicable (EF >/= 4079%For EF <40%, Aldosterone Antagonist (Spironolactone or Eplerenone) prescribed? - Not Applicable (EF >/= 4089%Cardiac Rehab Phase II ordered (Included Medically managed Patients)? - Yes _____________   Discharge Vitals Blood pressure 108/86, pulse 69, temperature 97.6 F (36.4 C), temperature source Temporal, resp. rate (!) 21, height _0  (1.676 m), weight 96.2 kg, SpO2 97 %.  Filed Weights   03/11/22 0641  Weight: 96.2 kg    Last Labs & Radiologic Studies    CBC Recent Labs    03/11/22 0630  WBC 6.4  HGB 17.1*  HCT 49.6  MCV 86.4  PLT 16211 Basic Metabolic Panel No results for input(s): "NA", "K", "CL", "CO2", "GLUCOSE", "BUN", "CREATININE", "CALCIUM", "MG", "PHOS" in the last 72 hours. Liver Function Tests No results for input(s): "AST", "ALT", "ALKPHOS", "BILITOT", "PROT", "ALBUMIN" in the last 72 hours. No results for input(s): "LIPASE", "AMYLASE" in the last 72 hours. High Sensitivity Troponin:   No results for input(s): "TROPONINIHS" in the last 720 hours.  BNP Invalid input(s): "POCBNP" D-Dimer No  results for input(s): "DDIMER" in the last 72 hours. Hemoglobin A1C No results for input(s): "HGBA1C" in the last 72 hours. Fasting Lipid Panel No results for input(s): "CHOL", "HDL", "LDLCALC", "TRIG", "CHOLHDL", "LDLDIRECT" in the last 72 hours. Thyroid Function Tests No results for input(s): "TSH", "T4TOTAL", "T3FREE", "THYROIDAB" in the last 72 hours.  Invalid input(s): "FREET3" _____________  CARDIAC CATHETERIZATION  Result Date: 03/11/2022   Mid RCA lesion is 15% stenosed.   Ost 2nd Mrg to 2nd Mrg lesion is 50% stenosed.   2nd Mrg lesion is 90% stenosed.   Mid Cx lesion is 70% stenosed.   Prox RCA lesion is 90% stenosed.   Prox LAD to Mid LAD lesion is 50% stenosed.   A drug-eluting stent was successfully placed using a SYNERGY XD 3.0X24.   Post intervention, there is a 0% residual stenosis.   There is severe aortic valve stenosis. 1.  Severe aortic stenosis with mean transaortic gradient 42 mmHg, calculated aortic valve area 0.6 cm 2.  Two-vessel coronary artery disease with severe stenosis of the proximal RCA, treated with PCI using a 3.0 x 24 mm Synergy DES and severe stenosis of the OM branch of the circumflex with recommendations for medical therapy due to lesion complexity 3.  Moderate nonobstructive diffuse proximal to mid LAD stenosis estimated at 50% Recommendations: Resume  warfarin today, resume Lovenox bridging tomorrow, clopidogrel 75 mg daily concurrent with warfarin anticoagulation for 6 months.  Will withhold aspirin due to bleeding risk of triple therapy in this 88 year old gentleman.  Same-day PCI discharge protocol if criteria met.   VAS [place] ABI WITH/WO TBI  Result Date: 02/11/2022  LOWER EXTREMITY DOPPLER STUDY Patient Name:  [name]  Date of Exam:   02/11/2022 Medical Rec #: [id number]       Accession #:    [id number] Date of Birth: 06/20/1930      Patient Gender: M Patient Age:   32 years Exam Location:  [facility] Procedure:      VAS [place] ABI  WITH/WO TBI Referring Phys: MARCUS DUDA --------------------------------------------------------------------------------  Indications: Peripheral artery disease. High Risk Factors: Hypertension, Diabetes, past history of smoking, coronary                    artery disease. Other Factors: Gout, Atrial Fib.  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 Comparison Study: Bilateral ABI are now not compressible. No significant change                   of bilateral TBI. Performing Technologist: Alvia Grove RVT  Examination Guidelines: A complete evaluation includes at minimum, Doppler waveform signals and systolic blood pressure reading at the level of bilateral brachial, anterior tibial, and posterior tibial arteries, when vessel segments are accessible. Bilateral testing is considered an integral part of a complete examination. Photoelectric Plethysmograph (PPG) waveforms and toe systolic pressure readings are included as required and additional duplex testing as needed. Limited examinations for reoccurring indications may be performed as noted.  ABI Findings: +---------+------------------+-----+----------+--------+ Right    Rt Pressure (mmHg)IndexWaveform  Comment  +---------+------------------+-----+----------+--------+ Brachial 151                                       +---------+------------------+-----+----------+--------+ PTA      212               1.40 biphasic           +---------+------------------+-----+----------+--------+ DP       211               1.40 monophasicbrisk    +---------+------------------+-----+----------+--------+ Great Toe100               0.66 Abnormal           +---------+------------------+-----+----------+--------+ +---------+------------------+-----+----------+-------+ Left     Lt Pressure (mmHg)IndexWaveform  Comment +---------+------------------+-----+----------+-------+ Brachial 142                                       +---------+------------------+-----+----------+-------+ PTA      254               1.68 triphasic         +---------+------------------+-----+----------+-------+ DP       254               1.68 monophasic        +---------+------------------+-----+----------+-------+ Great Toe118               0.78 Normal            +---------+------------------+-----+----------+-------+ +-------+-----------+-----------+------------+------------+ ABI/TBIToday's ABIToday's TBIPrevious ABIPrevious TBI +-------+-----------+-----------+------------+------------+ Right    0.66       1.1         0.68         +-------+-----------+-----------+------------+------------+ Left   [place]         0.78       1.36        0.8          +-------+-----------+-----------+------------+------------+   Summary: Right: Resting right ankle-brachial index indicates noncompressible right lower extremity arteries. The right toe-brachial index is abnormal. Left: Resting left ankle-brachial index indicates noncompressible left lower extremity arteries. The left toe-brachial index is normal. *See table(s) above for measurements and observations.  Electronically signed by Servando Snare MD on 02/11/2022 at 11:14:47 AM.    Final     Disposition   Pt is being discharged home today in good condition.  Follow-up Plans & Appointments     Follow-up Information     Coralie Common, MD Follow up on 03/30/2022.   Specialty: Cardiothoracic Surgery Why: at 10am for your follow up appt Contact information: [place] [address] [phone]                Discharge Instructions     AMB Referral to Cardiac Rehabilitation - Phase II   Complete by: As directed    Diagnosis: Coronary Stents   After initial evaluation and assessments completed: Virtual Based Care may be provided alone or in conjunction with Phase 2 Cardiac Rehab based on patient barriers.: Yes   Intensive Cardiac  Rehabilitation (ICR) [place] location only OR Traditional Cardiac Rehabilitation (TCR) *If criteria for ICR are not met will enroll in TCR [facility] only): Yes      Discharge Medications   Allergies as of 03/11/2022       Reactions   Penicillins Shortness Of Breath, Swelling   Patient tolerated Rocephin injection 2019 PATIENT HAS HAD A PCN REACTION WITH IMMEDIATE RASH, FACIAL/TONGUE/THROAT SWELLING, SOB, OR LIGHTHEADEDNESS WITH HYPOTENSION:  #  #  YES  #  Has patient had a PCN reaction causing severe rash involving mucus membranes or skin necrosis: NO Has patient had a PCN reaction that required hospitalization NO Has patient had a PCN reaction occurring within the last 10 years: NO If all of the above answers are "NO", then may proceed with Cephalosporin use.   Atorvastatin Other (See Comments)   No energy, just felt bad   Pravastatin Other (See Comments)   myalgias   Atrovent Nasal Spray [ipratropium]    Dizzy with use   Colchicine    Due to renal function- can use prednisone if needed    Hydrocodone Other (See Comments)   "swimmy headed" after use   Nsaids    Due to renal function   Uloric [febuxostat] Other (See Comments)   Would avoid, worsening gout sx with use.    Vancomycin Other (See Comments)   Red man syndrome- tolerated with slow run and benadryl use   Clarithromycin Diarrhea, Other (See Comments)   Other reaction(s): Not available   Doxycycline Other (See Comments)   GI upset        Medication List     STOP taking these medications    omeprazole 40 MG capsule Commonly known as: PRILOSEC       TAKE these medications    acetaminophen 500 MG tablet Commonly known as: TYLENOL Take 1,000 mg by mouth every 8 (eight) hours as needed for mild pain.   allopurinol 100 MG tablet Commonly known as: ZYLOPRIM TAKE  1 TABLET BY MOUTH EVERY DAY   clopidogrel 75 MG tablet Commonly known as: Plavix Take 1 tablet (75 mg total) by mouth daily.   colchicine 0.6 MG  tablet TAKE 1 TABLET BY MOUTH ONCE DAILY AS NEEDED. CAN TAKE 2ND TABLET ONCE DAILY AS NEEDED FOR GOUT FLARE   cyanocobalamin 1000 MCG/ML injection Commonly known as: VITAMIN B12 1077mg IM every 28 days   diltiazem 180 MG 24 hr capsule Commonly known as: CARDIZEM CD Take 1 capsule (180 mg total) by mouth daily.   enoxaparin 150 MG/ML injection Commonly known as: LOVENOX Inject 1 mL (150 mg total) into the skin daily.   furosemide 20 MG tablet Commonly known as: LASIX Take 1 tablet (20 mg total) by mouth daily.   lisinopril 20 MG tablet Commonly known as: ZESTRIL Take 10 mg by mouth in the morning and at bedtime.   metoprolol tartrate 25 MG tablet Commonly known as: LOPRESSOR Take 0.5 tablets (12.5 mg total) by mouth 2 (two) times daily.   pantoprazole 40 MG tablet Commonly known as: Protonix Take 1 tablet (40 mg total) by mouth daily.   tamsulosin 0.4 MG Caps capsule Commonly known as: FLOMAX TAKE 1 CAPSULE BY MOUTH EVERY DAY   triamcinolone cream 0.1 % Commonly known as: KENALOG Apply 1 Application topically 2 (two) times daily as needed (irritation).   warfarin 4 MG tablet Commonly known as: COUMADIN Take as directed. If you are unsure how to take this medication, talk to your nurse or doctor. Original instructions: TAKE 1 TO 1 AND 1/2 TABLETS BY MOUTH DAILY OR AS DIRECTED BY COUMADIN CLINIC What changed: See the new instructions.         Allergies Allergies  Allergen Reactions   Penicillins Shortness Of Breath and Swelling    Patient tolerated Rocephin injection 2019  PATIENT HAS HAD A PCN REACTION WITH IMMEDIATE RASH, FACIAL/TONGUE/THROAT SWELLING, SOB, OR LIGHTHEADEDNESS WITH HYPOTENSION:  #  #  YES  #  Has patient had a PCN reaction causing severe rash involving mucus membranes or skin necrosis: NO Has patient had a PCN reaction that required hospitalization NO Has patient had a PCN reaction occurring within the last 10 years: NO If all of the above  answers are "NO", then may proceed with Cephalosporin use.    Atorvastatin Other (See Comments)    No energy, just felt bad     Pravastatin Other (See Comments)    myalgias   Atrovent Nasal Spray [Ipratropium]     Dizzy with use   Colchicine     Due to renal function- can use prednisone if needed    Hydrocodone Other (See Comments)    "swimmy headed" after use   Nsaids     Due to renal function   Uloric [Febuxostat] Other (See Comments)    Would avoid, worsening gout sx with use.    Vancomycin Other (See Comments)    Red man syndrome- tolerated with slow run and benadryl use   Clarithromycin Diarrhea and Other (See Comments)    Other reaction(s): Not available   Doxycycline Other (See Comments)    GI upset     Outstanding Labs/Studies   N/a   Duration of Discharge Encounter   Greater than 30 minutes including physician time.  Signed, LReino Bellis NP 03/11/2022, 1:35 PM

## 2022-03-11 NOTE — Interval H&P Note (Signed)
History and Physical Interval Note:  03/11/2022 8:44 AM  [name]  has presented today for surgery, with the diagnosis of aortic stenosis.  The various methods of treatment have been discussed with the patient and family. After consideration of risks, benefits and other options for treatment, the patient has consented to  Procedure(s): RIGHT/LEFT HEART CATH AND CORONARY ANGIOGRAPHY (N/A) as a surgical intervention.  The patient's history has been reviewed, patient examined, no change in status, stable for surgery.  I have reviewed the patient's chart and labs.  Questions were answered to the patient's satisfaction.     Sherren Mocha

## 2022-03-11 NOTE — Discharge Instructions (Signed)

## 2022-03-11 NOTE — Progress Notes (Signed)
TR BAND REMOVAL  LOCATION:    right radial  DEFLATED PER PROTOCOL:    Yes.    TIME BAND OFF / DRESSING APPLIED:    1215   SITE UPON ARRIVAL:    Level 0  SITE AFTER BAND REMOVAL:    Level 0  CIRCULATION SENSATION AND MOVEMENT:    Within Normal Limits   Yes.    COMMENTS:   No bleeding noted

## 2022-03-12 ENCOUNTER — Ambulatory Visit: Payer: Medicare Other

## 2022-03-12 ENCOUNTER — Encounter (HOSPITAL_COMMUNITY): Payer: Self-pay | Admitting: Cardiovascular Disease

## 2022-03-16 ENCOUNTER — Other Ambulatory Visit: Payer: Self-pay | Admitting: Family Medicine

## 2022-03-16 DIAGNOSIS — R1319 Other dysphagia: Secondary | ICD-10-CM

## 2022-03-16 DIAGNOSIS — K222 Esophageal obstruction: Secondary | ICD-10-CM

## 2022-03-17 ENCOUNTER — Encounter (HOSPITAL_COMMUNITY): Payer: Self-pay

## 2022-03-17 ENCOUNTER — Ambulatory Visit (HOSPITAL_COMMUNITY)
Admission: RE | Admit: 2022-03-17 | Discharge: 2022-03-17 | Disposition: A | Discharge status: Home / Self Care | Payer: Medicare Other | Source: Ambulatory Visit | Attending: Cardiovascular Disease | Admitting: Cardiovascular Disease

## 2022-03-17 ENCOUNTER — Ambulatory Visit (INDEPENDENT_AMBULATORY_CARE_PROVIDER_SITE_OTHER): Payer: Medicare Other | Admitting: *Deleted

## 2022-03-17 DIAGNOSIS — I482 Chronic atrial fibrillation, unspecified: Secondary | ICD-10-CM

## 2022-03-17 DIAGNOSIS — Z5181 Encounter for therapeutic drug level monitoring: Secondary | ICD-10-CM

## 2022-03-17 DIAGNOSIS — I700 Atherosclerosis of aorta: Secondary | ICD-10-CM | POA: Diagnosis not present

## 2022-03-17 DIAGNOSIS — R911 Solitary pulmonary nodule: Secondary | ICD-10-CM | POA: Diagnosis not present

## 2022-03-17 DIAGNOSIS — I350 Nonrheumatic aortic (valve) stenosis: Secondary | ICD-10-CM | POA: Diagnosis not present

## 2022-03-17 DIAGNOSIS — J8410 Pulmonary fibrosis, unspecified: Secondary | ICD-10-CM | POA: Diagnosis not present

## 2022-03-17 LAB — POCT INR: POC INR: 1.7

## 2022-03-17 MED ORDER — METOPROLOL TARTRATE 5 MG/5ML IV SOLN
5.0000 mg | INTRAVENOUS | Status: DC | PRN
  Administered 2022-03-17: 5 mg via INTRAVENOUS

## 2022-03-17 MED ORDER — IOHEXOL 350 MG/ML SOLN
100.0000 mL | Freq: Once | INTRAVENOUS | Status: AC | PRN
  Administered 2022-03-17: 100 mL via INTRAVENOUS

## 2022-03-17 MED ORDER — METOPROLOL TARTRATE 5 MG/5ML IV SOLN
INTRAVENOUS | Status: AC
  Administered 2022-03-17: 5 mg via INTRAVENOUS
  Filled 2022-03-17: qty 10

## 2022-03-17 NOTE — Patient Instructions (Signed)
Description    Take 2 tablets of warfarin today and then continue to take warfarin 1 tablet daily excpet for 1.5 tablets on Sunday and Thursdays. Recheck INR in 1 week.  [phone].  Last dose of lovenox was on 03/17/2022

## 2022-03-17 NOTE — Progress Notes (Addendum)
Pt tolerated exam without incident; pt denies lightheadedness or dizziness; pt wheeled to radiology lobby in wheelchair to daughter who will drive him home

## 2022-03-18 ENCOUNTER — Encounter: Payer: Self-pay | Admitting: Physician Assistant

## 2022-03-23 ENCOUNTER — Ambulatory Visit (INDEPENDENT_AMBULATORY_CARE_PROVIDER_SITE_OTHER): Payer: Medicare Other | Admitting: Orthopedic Surgery

## 2022-03-23 DIAGNOSIS — L97511 Non-pressure chronic ulcer of other part of right foot limited to breakdown of skin: Secondary | ICD-10-CM | POA: Diagnosis not present

## 2022-03-23 DIAGNOSIS — M6701 Short Achilles tendon (acquired), right ankle: Secondary | ICD-10-CM | POA: Diagnosis not present

## 2022-03-24 ENCOUNTER — Ambulatory Visit: Payer: Medicare Other

## 2022-03-26 ENCOUNTER — Telehealth (HOSPITAL_COMMUNITY): Payer: Self-pay

## 2022-03-26 ENCOUNTER — Telehealth: Payer: Self-pay | Admitting: Cardiovascular Disease

## 2022-03-26 NOTE — Telephone Encounter (Signed)
Called patient to see if he is interested in the Cardiac Rehab Program. Patient expressed interest. Explained scheduling process and went over insurance, patient verbalized understanding. Will contact patient for scheduling once f/u has been completed.  °

## 2022-03-26 NOTE — Telephone Encounter (Signed)
Left message with daughter that we do not have a specific case time yet. We have him penciled in for 1/30 for TAVR but have to see how the other patients line up before the case can be booked. We will hopefully have more concrete information for him by his upcoming apt with Dr. Lavonna Monarch.

## 2022-03-26 NOTE — Telephone Encounter (Signed)
Pt insurance is active and benefits verified through Medicare A/B. Co-pay $0.00, DED $240.00/$0.00 met, out of pocket $0.00/$0.00 met, co-insurance 20%. No pre-authorization required. Passport, 03/26/22 @ 2:03PM, ECX#[id number]   How many CR sessions are covered? (36 sessions for TCR, 72 sessions for ICR)72 Is this a lifetime maximum or an annual maximum? lifetime Has the member used any of these services to date? No Is there a time limit (weeks/months) on start of program and/or program completion? No   2ndary insurance is active and benefits verified through [employer].. Co-pay $0.00, DED $0.00/$0.00 met, out of pocket $0.00/$0.00 met, co-insurance 0%. No pre-authorization required. Passport, 03/26/22 @ 2:07PM, OIP#[id number]      Will contact patient to see if he is interested in the Cardiac Rehab Program. If interested, patient will need to complete follow up appt. Once completed, patient will be contacted for scheduling upon review by the RN Navigator.

## 2022-03-26 NOTE — Telephone Encounter (Signed)
  Pt's daughter calling, she said, she need information about pt's procedure for heart valve replacement. She said, they know pt is scheduled on 0/30/24 and needs details of the appt

## 2022-03-27 ENCOUNTER — Ambulatory Visit: Payer: Medicare Other | Attending: Cardiology

## 2022-03-27 ENCOUNTER — Encounter: Payer: Self-pay | Admitting: Orthopedic Surgery

## 2022-03-27 DIAGNOSIS — I482 Chronic atrial fibrillation, unspecified: Secondary | ICD-10-CM | POA: Diagnosis not present

## 2022-03-27 DIAGNOSIS — M1712 Unilateral primary osteoarthritis, left knee: Secondary | ICD-10-CM | POA: Diagnosis not present

## 2022-03-27 DIAGNOSIS — Z5181 Encounter for therapeutic drug level monitoring: Secondary | ICD-10-CM

## 2022-03-27 LAB — POCT INR: INR: 2.4 (ref 2.0–3.0)

## 2022-03-27 NOTE — Progress Notes (Signed)
Office Visit Note   Patient: [name]           Date of Birth: [birth year]           MRN: [id number] Visit Date: 03/23/2022              Requested by: Tonia Ghent, MD [address] 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  Assessment & Plan: Visit Diagnoses:  1. Right foot ulcer, limited to breakdown of skin ([place])   2. Achilles tendon contracture, right     Plan: Patient will continue with Achilles stretching discussed the possibility of gastrocnemius recession.  Patient states that he is having a TAVR procedure soon.  Follow-Up Instructions: Return in about 4 weeks (around 04/20/2022).   Ortho Exam  Patient is alert, oriented, no adenopathy, well-dressed, normal affect, normal respiratory effort. Examination patient has a Wagner grade 1 ulcer beneath the second metatarsal head.  After informed consent a 10 blade knife was used to debride the skin and soft tissue back to healthy viable granulation tissue.  There is no tunneling no depth.  The ulcer is 2 cm in diameter after debridement.  Imaging: No results found. No images are attached to the encounter.  Labs: Lab Results  Component Value Date   HGBA1C 6.8 (H) 05/16/2021   HGBA1C 7.2 (H) 07/23/2020   HGBA1C 7.7 (H) 09/11/2019   ESRSEDRATE 17 (H) 09/09/2017   CRP 1.1 (H) 09/09/2017   LABURIC 7.1 05/16/2021   LABURIC 7.6 12/16/2020   LABURIC 7.5 07/23/2020   REPTSTATUS 09/19/2017 FINAL 09/14/2017   GRAMSTAIN  09/10/2017    MODERATE WBC PRESENT,BOTH PMN AND MONONUCLEAR NO ORGANISMS SEEN    CULT  09/14/2017    NO GROWTH 5 DAYS Performed at [facility], [place] [address] [place], [place] [address]      Lab Results  Component Value Date   ALBUMIN 4.0 09/17/2020   ALBUMIN 4.2 07/23/2020   ALBUMIN 4.2  05/16/2019    Lab Results  Component Value Date   MG 2.2 09/09/2017   No results found for: "VD25OH"  No results found for: "PREALBUMIN"    Latest Ref Rng & Units 03/11/2022    9:12 AM 03/11/2022    9:07 AM 03/11/2022    9:06 AM  CBC EXTENDED  Hemoglobin 13.0 - 17.0 g/dL 13.0 - 17.0 g/dL 15.0    15.3  15.3  15.3   HCT 39.0 - 52.0 % 39.0 - 52.0 % 44.0    45.0  45.0  45.0      There is no height or weight on file to calculate BMI.  Orders:  No orders of the defined types were placed in this encounter.  No orders of the defined types were placed in this encounter.    Procedures: No procedures performed  Clinical Data: No additional findings.  ROS:  All other systems negative, except as noted in the HPI. Review of Systems  Objective: Vital Signs: There were no vitals taken for this visit.  Specialty Comments:  No specialty comments available.  PMFS History: Patient Active Problem List   Diagnosis Date Noted   Severe aortic stenosis 03/11/2022   Sinusitis 07/27/2021   Sensation of feeling cold 07/17/2021   PND (post-nasal drip) 07/17/2021  Acute non-recurrent frontal sinusitis 05/08/2021   Obstructive sleep apnea treated with bilevel positive airway pressure (BiPAP) 03/19/2021   Lower urinary tract symptoms (LUTS) 09/16/2020   Creatinine elevation 07/31/2020   Drug-induced myopathy 07/31/2020   Diabetic polyneuropathy associated with type 2 diabetes mellitus ([place]) 06/20/2020   Gouty arthritis of left foot 06/20/2020   Diabetic peripheral neuropathy ([place]) 10/04/2019   Healthcare maintenance 05/22/2019   B12 deficiency 05/17/2019   Idiopathic chronic gout, unspecified site, without tophus (tophi) 32/99/2426   Acute diastolic heart failure (HCC)    CAD S/P percutaneous coronary angioplasty    Chronic atrial fibrillation (HCC)    CKD (chronic kidney disease), stage III ([place])    Hypertension    Hyperlipidemia    Back pain 05/24/2015   ETD (eustachian  tube dysfunction) 05/24/2015   Dysphagia, pharyngoesophageal phase 09/25/2014   History of esophageal stricture 09/25/2014   Chronic anticoagulation 09/25/2014   Acute cough 03/14/2014   Esophageal stricture from GERD 06/08/2011   Advance care planning 04/20/2011   OSA on CPAP 09/22/2010   Gout 08/15/2008   OBESITY 08/15/2008   Essential hypertension 08/15/2008   Coronary artery disease with exertional angina ([place]) 08/15/2008   GASTROESOPHAGEAL REFLUX DISEASE 08/15/2008   Past Medical History:  Diagnosis Date   Arthritis    Bradycardia    a. nocturnal with pauses overnight on tele likely due to OSA 10/2015.   CKD (chronic kidney disease), stage III (HCC)    Coronary artery disease    a. prev nonobst. b. LHC 10/24/15: occlusion of small diffusely diseased OM2 s/p balloon angioplasty, mild nonobstructive disease of mLAD and mRCA, normal LVEDP   Diabetes mellitus without complication ([place])    83/41/96 Pt's daughter states patient does not  have diabetes.  A1C 07/23/20 was 7.2, patientr had been on medication.   Diabetic peripheral neuropathy ([place]) 10/04/2019   Diverticulosis    Esophageal reflux    Esophageal stricture from GERD 06/08/2011   With stricture at GE junction on EGD, dilated 06/2011    Gastric ulcer    on EGD 2013   Gout, unspecified    "on daily RX" (09/09/2017)   History of blood transfusion 02/1964   "when I had right little finger cut off"   Hyperlipidemia    Hypertension    Obesity, unspecified    OSA on CPAP    Paroxysmal nocturnal dyspnea    Persistent atrial fibrillation (HCC)    Pneumonia    Severe aortic stenosis    Vertigo     Family History  Problem Relation Age of Onset   Stomach cancer Mother        died in 47's    Hypertension Father        died in his 72's pna likely dementia   Dementia Father    Pneumonia Father    Heart disease Brother        S/P CABG   Sleep apnea Brother    Sleep apnea Daughter    Hypertension Other    Prostate cancer  Neg Hx    Colon cancer Neg Hx     Past Surgical History:  Procedure Laterality Date   AMPUTATION Right 03/03/2019   Procedure: RIGHT 2ND TOE AMPUTATION;  Surgeon: [name], MD;  Location: [facility];  Service: Orthopedics;  Laterality: Right;   AMPUTATION Right 01/03/2021   Procedure: RIGHT 3RD TOE AMPUTATION;  Surgeon: [name], MD;  Location: [facility];  Service: Orthopedics;  Laterality: Right;   BALLOON  DILATION N/A 10/22/2014   Procedure: BALLOON DILATION;  Surgeon: [name], MD;  Location: [facility];  Service: Endoscopy;  Laterality: N/A;   CARDIAC CATHETERIZATION  07/2008   CARDIAC CATHETERIZATION N/A 10/24/2015   Procedure: Left Heart Cath and Coronary Angiography;  Surgeon: [name], MD;  Location: [facility];  Service: Cardiovascular;  Laterality: N/A;   CARDIAC CATHETERIZATION N/A 10/24/2015   Procedure: Coronary Balloon Angioplasty;  Surgeon: [name], MD;  Location: [facility];  Service: Cardiovascular;  Laterality: N/A;   Carotid Dopplers  09/2009   no sig extracranial stenosis and vertebral arteries had antegrade flow   CATARACT EXTRACTION W/ INTRAOCULAR LENS  IMPLANT, BILATERAL Bilateral    CORONARY STENT INTERVENTION N/A 03/11/2022   Procedure: CORONARY STENT INTERVENTION;  Surgeon: Sherren Mocha, MD;  Location: [facility];  Service: Cardiovascular;  Laterality: N/A;   ESOPHAGOGASTRODUODENOSCOPY (EGD) WITH ESOPHAGEAL DILATION     "I've had it stretched 3 times" (10/23/2015)   ESOPHAGOGASTRODUODENOSCOPY (EGD) WITH ESOPHAGEAL DILATION  06/2011   /medical hx above  (09/09/2017)   ESOPHAGOGASTRODUODENOSCOPY (EGD) WITH PROPOFOL N/A 10/22/2014   Procedure: ESOPHAGOGASTRODUODENOSCOPY (EGD) WITH PROPOFOL;  Surgeon: [name], MD;  Location: [facility];  Service: Endoscopy;  Laterality: N/A;   FINGER SURGERY Right 02/1964   "cut little finger off; had it reattached"   I & D EXTREMITY Right 09/10/2017   Procedure: RIGHT FOOT  DEBRIDEMENT;  Surgeon: [name], MD;  Location: [facility];  Service: Orthopedics;  Laterality: Right;   MRI of brain  09/2009   chronic microvascular ischemia   OSA Sleep Study     per Dr. 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

## 2022-03-27 NOTE — Patient Instructions (Signed)
continue to take warfarin 1 tablet daily excpet for 1.5 tablets on Sunday and Thursdays. Recheck INR in 1 week.  [phone].  TAVR 1/30 awaiting clearance

## 2022-03-29 NOTE — Progress Notes (Unsigned)
[address]       [place] [address]             [phone]           [name] [facility] Medical Record #[id number] Date of Birth: 1930-11-23  Sherren Mocha, MD Tonia Ghent, MD  Chief Complaint:   No chief complaint on file.   History of Present Illness:           Past Medical History:  Diagnosis Date   Arthritis    Bradycardia    a. nocturnal with pauses overnight on tele likely due to OSA 10/2015.   CKD (chronic kidney disease), stage III (HCC)    Coronary artery disease    a. prev nonobst. b. LHC 10/24/15: occlusion of small diffusely diseased OM2 s/p balloon angioplasty, mild nonobstructive disease of mLAD and mRCA, normal LVEDP   Diabetes mellitus without complication ([place])    74/94/49 Pt's daughter states patient does not  have diabetes.  A1C 07/23/20 was 7.2, patientr had been on medication.   Diabetic peripheral neuropathy ([place]) 10/04/2019   Diverticulosis    Esophageal reflux    Esophageal stricture from GERD 06/08/2011   With stricture at GE junction on EGD, dilated 06/2011    Gastric ulcer    on EGD 2013   Gout, unspecified    "on daily RX" (09/09/2017)   History of blood transfusion 02/1964   "when I had right little finger cut off"   Hyperlipidemia    Hypertension    Obesity, unspecified    OSA on CPAP    Paroxysmal nocturnal dyspnea    Persistent atrial fibrillation (HCC)    Pneumonia    Severe aortic stenosis    Vertigo     Past Surgical History:  Procedure Laterality Date   AMPUTATION Right 03/03/2019   Procedure: RIGHT 2ND TOE AMPUTATION;  Surgeon: [name], MD;  Location: [facility];  Service: Orthopedics;  Laterality: Right;   AMPUTATION Right 01/03/2021   Procedure: RIGHT 3RD TOE AMPUTATION;  Surgeon: [name], MD;  Location: [facility];  Service: Orthopedics;  Laterality: Right;   BALLOON DILATION N/A 10/22/2014   Procedure: BALLOON DILATION;  Surgeon: [name], MD;  Location: [facility];   Service: Endoscopy;  Laterality: N/A;   CARDIAC CATHETERIZATION  07/2008   CARDIAC CATHETERIZATION N/A 10/24/2015   Procedure: Left Heart Cath and Coronary Angiography;  Surgeon: [name], MD;  Location: [facility];  Service: Cardiovascular;  Laterality: N/A;   CARDIAC CATHETERIZATION N/A 10/24/2015   Procedure: Coronary Balloon Angioplasty;  Surgeon: [name], MD;  Location: [facility];  Service: Cardiovascular;  Laterality: N/A;   Carotid Dopplers  09/2009   no sig extracranial stenosis and vertebral arteries had antegrade flow   CATARACT EXTRACTION W/ INTRAOCULAR LENS  IMPLANT, BILATERAL Bilateral    CORONARY STENT INTERVENTION N/A 03/11/2022   Procedure: CORONARY STENT INTERVENTION;  Surgeon: Sherren Mocha, MD;  Location: [facility];  Service: Cardiovascular;  Laterality: N/A;   ESOPHAGOGASTRODUODENOSCOPY (EGD) WITH ESOPHAGEAL DILATION     "I've had it stretched 3 times" (10/23/2015)   ESOPHAGOGASTRODUODENOSCOPY (EGD) WITH ESOPHAGEAL DILATION  06/2011   /medical hx above  (09/09/2017)   ESOPHAGOGASTRODUODENOSCOPY (EGD) WITH PROPOFOL N/A 10/22/2014   Procedure: ESOPHAGOGASTRODUODENOSCOPY (EGD) WITH PROPOFOL;  Surgeon: [name], MD;  [facility]: [facility];  Service: Endoscopy;  Laterality: N/A;   FINGER SURGERY Right 02/1964   "cut little  finger off; had it reattached"   I & D EXTREMITY Right 09/10/2017   Procedure: RIGHT FOOT DEBRIDEMENT;  Surgeon: [name], MD;  Location: [facility];  Service: Orthopedics;  Laterality: Right;   MRI of brain  09/2009   chronic microvascular ischemia   OSA Sleep Study     per Dr. Brett Fairy   RIGHT/LEFT HEART CATH AND CORONARY ANGIOGRAPHY N/A 03/11/2022   Procedure: RIGHT/LEFT HEART CATH AND CORONARY ANGIOGRAPHY;  Surgeon: Sherren Mocha, MD;  Location: [facility];  Service: Cardiovascular;  Laterality: N/A;   SHOULDER ARTHROSCOPY W/ ROTATOR CUFF REPAIR Right 2008    Social History   Tobacco Use  Smoking  Status Former   Packs/day: 0.12   Years: 4.00   Total pack years: 0.48   Types: Cigarettes   Quit date: 03/16/1969   Years since quitting: 53.0  Smokeless Tobacco Former  Tobacco Comments   "chewed when I smoked; smoked 1 pack/week for about 4 years, quit 1971    Social History   Substance and Sexual Activity  Alcohol Use Not Currently   Alcohol/week: 0.0 standard drinks of alcohol   Comment: 09/09/2017  "Quit all alcohol in 1970's"    Social History   Socioeconomic History   Marital status: Widowed    Spouse name: Not on file   Number of children: 3   Years of education: Not on file   Highest education level: Not on file  Occupational History   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  Tobacco Use   Smoking status: Former    Packs/day: 0.12    Years: 4.00    Total pack years: 0.48    Types: Cigarettes    Quit date: 03/16/1969    Years since quitting: 53.0   Smokeless tobacco: Former   Tobacco comments:    "chewed when I smoked; smoked 1 pack/week for about 4 years, quit 1971  Vaping Use   Vaping Use: Never used  Substance and Sexual Activity   Alcohol use: Not Currently    Alcohol/week: 0.0 standard drinks of alcohol    Comment: 09/09/2017  "Quit all alcohol in 1970's"   Drug use: Never   Sexual activity: Not on file  Other Topics Concern   Not on file  Social History Narrative   Lives in [place], married 1953, widowed after 69 years   Retired Administrator, still does Biomedical scientist.   He is not routinely exercising.   Daily caffeine    Social Determinants of Health   Financial Resource Strain: Low Risk  (11/27/2021)   Overall Financial Resource Strain (CARDIA)    Difficulty of Paying Living Expenses: Not hard at all  Food Insecurity: No Food Insecurity (11/27/2021)   Hunger Vital Sign    Worried About Running Out of Food in the Last Year: Never true    Ran Out of Food in the Last Year: Never true  Transportation Needs: No  Transportation Needs (11/27/2021)   PRAPARE - Hydrologist (Medical): No    Lack of Transportation (Non-Medical): No  Physical Activity: Insufficiently Active (11/27/2021)   Exercise Vital Sign    Days of Exercise per Week: 7 days    Minutes of Exercise per Session: 20 min  Stress: No Stress Concern Present (11/27/2021)   [place]    Feeling of Stress : Not at all  Social Connections: Moderately Integrated (11/27/2021)   Social Connection  and Isolation Panel [NHANES]    Frequency of Communication with Friends and Family: Twice a week    Frequency of Social Gatherings with Friends and Family: Twice a week    Attends Religious Services: 1 to 4 times per year    Active Member of [employer] or Organizations: No    Attends Music therapist: More than 4 times per year    Marital Status: Widowed  Intimate Partner Violence: Not At Risk (11/27/2021)   Humiliation, Afraid, Rape, and Kick questionnaire    Fear of Current or Ex-Partner: No    Emotionally Abused: No    Physically Abused: No    Sexually Abused: No    Allergies  Allergen Reactions   Penicillins Shortness Of Breath and Swelling    Patient tolerated Rocephin injection 2019  PATIENT HAS HAD A PCN REACTION WITH IMMEDIATE RASH, FACIAL/TONGUE/THROAT SWELLING, SOB, OR LIGHTHEADEDNESS WITH HYPOTENSION:  #  #  YES  #  Has patient had a PCN reaction causing severe rash involving mucus membranes or skin necrosis: NO Has patient had a PCN reaction that required hospitalization NO Has patient had a PCN reaction occurring within the last 10 years: NO If all of the above answers are "NO", then may proceed with Cephalosporin use.    Atorvastatin Other (See Comments)    No energy, just felt bad     Pravastatin Other (See Comments)    myalgias   Atrovent Nasal Spray [Ipratropium]     Dizzy with use   Colchicine     Due to renal function-  can use prednisone if needed    Hydrocodone Other (See Comments)    "swimmy headed" after use   Nsaids     Due to renal function   Uloric [Febuxostat] Other (See Comments)    Would avoid, worsening gout sx with use.    Vancomycin Other (See Comments)    Red man syndrome- tolerated with slow run and benadryl use   Clarithromycin Diarrhea and Other (See Comments)    Other reaction(s): Not available   Doxycycline Other (See Comments)    GI upset     Current Outpatient Medications  Medication Sig Dispense Refill   acetaminophen (TYLENOL) 500 MG tablet Take 1,000 mg by mouth every 8 (eight) hours as needed for mild pain.     allopurinol (ZYLOPRIM) 100 MG tablet TAKE 1 TABLET BY MOUTH EVERY DAY 90 tablet 3   clopidogrel (PLAVIX) 75 MG tablet Take 1 tablet (75 mg total) by mouth daily. 30 tablet 5   colchicine 0.6 MG tablet TAKE 1 TABLET BY MOUTH ONCE DAILY AS NEEDED. CAN TAKE 2ND TABLET ONCE DAILY AS NEEDED FOR GOUT FLARE     cyanocobalamin (,VITAMIN B-12,) 1000 MCG/ML injection 1021mg IM every 28 days 1 mL    diltiazem (CARDIZEM CD) 180 MG 24 hr capsule Take 1 capsule (180 mg total) by mouth daily. 90 capsule 3   enoxaparin (LOVENOX) 150 MG/ML injection Inject 1 mL (150 mg total) into the skin daily. 10 mL 1   furosemide (LASIX) 20 MG tablet Take 1 tablet (20 mg total) by mouth daily. 90 tablet 3   lisinopril (ZESTRIL) 20 MG tablet Take 10 mg by mouth in the morning and at bedtime.     metoprolol tartrate (LOPRESSOR) 25 MG tablet Take 0.5 tablets (12.5 mg total) by mouth 2 (two) times daily. 90 tablet 3   pantoprazole (PROTONIX) 40 MG tablet Take 1 tablet (40 mg total) by mouth daily. 30 tablet  1   tamsulosin (FLOMAX) 0.4 MG CAPS capsule TAKE 1 CAPSULE BY MOUTH EVERY DAY 90 capsule 3   triamcinolone cream (KENALOG) 0.1 % Apply 1 Application topically 2 (two) times daily as needed (irritation).     warfarin (COUMADIN) 4 MG tablet TAKE 1 TO 1 AND 1/2 TABLETS BY MOUTH DAILY OR AS DIRECTED BY  COUMADIN CLINIC (Patient taking differently: Take 4-6 mg by mouth See admin instructions. Take 1 to 1 and 1/2 tablets by mouth daily or as directed by Coumadin Clinic;  6 mg every Sun, Thu; 4 mg all other days) 120 tablet 1   No current facility-administered medications for this visit.     Family History  Problem Relation Age of Onset   Stomach cancer Mother        died in 21's    Hypertension Father        died in his 67's pna likely dementia   Dementia Father    Pneumonia Father    Heart disease Brother        S/P CABG   Sleep apnea Brother    Sleep apnea Daughter    Hypertension Other    Prostate cancer Neg Hx    Colon cancer Neg Hx        Physical Exam: There were no vitals taken for this visit.     Diagnostic Studies & Laboratory data: I have personally reviewed the following studies and agree with the findings   TTE (01/2022) IMPRESSIONS   1. Left ventricular ejection fraction, by estimation, is 65 to 70%. The  left ventricle has normal function. The left ventricle has no regional  wall motion abnormalities. There is mild left ventricular hypertrophy.  Left ventricular diastolic parameters  are indeterminate.   2. Right ventricular systolic function is mildly reduced. The right  ventricular size is normal. There is mildly elevated pulmonary artery  systolic pressure. The estimated right ventricular systolic pressure is  10.2 mmHg.   3. Left atrial size was mildly dilated.   4. The mitral valve is degenerative. Trivial mitral valve regurgitation.  Mild mitral stenosis. The mean mitral valve gradient is 4.0 mmHg with  average heart rate of 80 bpm. Severe mitral annular calcification.   5. The inferior vena cava is normal in size with greater than 50%  respiratory variability, suggesting right atrial pressure of 3 mmHg.   6. The aortic valve is calcified. There is severe calcifcation of the  aortic valve. Aortic valve regurgitation is not visualized. Severe  aortic  valve stenosis. Vmax 4.0 m/s, MG 35 mmHg, AVA 0.8 cm^2, DI 0.26   FINDINGS   Left Ventricle: Left ventricular ejection fraction, by estimation, is 65  to 70%. The left ventricle has normal function. The left ventricle has no  regional wall motion abnormalities. The left ventricular internal cavity  size was normal in size. There is   mild left ventricular hypertrophy. Left ventricular diastolic parameters  are indeterminate.   Right Ventricle: The right ventricular size is normal. No increase in  right ventricular wall thickness. Right ventricular systolic function is  mildly reduced. There is mildly elevated pulmonary artery systolic  pressure. The tricuspid regurgitant velocity   is 3.00 m/s, and with an assumed right atrial pressure of 3 mmHg, the  estimated right ventricular systolic pressure is 58.5 mmHg.   Left Atrium: Left atrial size was mildly dilated.   Right Atrium: Right atrial size was normal in size.   Pericardium: There is no evidence of pericardial  effusion.   Mitral Valve: The mitral valve is degenerative in appearance. Severe  mitral annular calcification. Trivial mitral valve regurgitation. Mild  mitral valve stenosis. The mean mitral valve gradient is 4.0 mmHg with  average heart rate of 80 bpm.   Tricuspid Valve: The tricuspid valve is normal in structure. Tricuspid  valve regurgitation is mild.   Aortic Valve: The aortic valve is calcified. There is severe calcifcation  of the aortic valve. Aortic valve regurgitation is not visualized. Severe  aortic stenosis is present. Aortic valve mean gradient measures 25.0 mmHg.  Aortic valve peak gradient  measures 48.9 mmHg. Aortic valve area, by VTI measures 0.94 cm.   Pulmonic Valve: The pulmonic valve was not well visualized. Pulmonic valve  regurgitation is not visualized.   Aorta: The aortic root and ascending aorta are structurally normal, with  no evidence of dilitation.   Venous: The inferior  vena cava is normal in size with greater than 50%  respiratory variability, suggesting right atrial pressure of 3 mmHg.   IAS/Shunts: No atrial level shunt detected by color flow Doppler.     LEFT VENTRICLE  PLAX 2D  LVIDd:         3.90 cm  LVIDs:         2.00 cm  LV PW:         1.20 cm  LV IVS:        1.10 cm  LVOT diam:     1.90 cm  LV SV:         62  LV SV Index:   30  LVOT Area:     2.84 cm     RIGHT VENTRICLE  RV S prime:     9.80 cm/s  TAPSE (M-mode): 1.2 cm  RVSP:           44.0 mmHg   LEFT ATRIUM           Index        RIGHT ATRIUM  LA diam:      5.00 cm 2.43 cm/m   RA Pressure: 8.00 mmHg  LA Vol (A4C): 71.7 ml 34.85 ml/m   AORTIC VALVE  AV Area (Vmax):    0.81 cm  AV Area (Vmean):   0.91 cm  AV Area (VTI):     0.94 cm  AV Vmax:           349.80 cm/s  AV Vmean:          220.000 cm/s  AV VTI:            0.664 m  AV Peak Grad:      48.9 mmHg  AV Mean Grad:      25.0 mmHg  LVOT Vmax:         99.93 cm/s  LVOT Vmean:        70.600 cm/s  LVOT VTI:          0.220 m  LVOT/AV VTI ratio: 0.33    AORTA  Ao Root diam: 3.00 cm  Ao Asc diam:  3.60 cm   MITRAL VALVE           TRICUSPID VALVE  MV Mean grad: 4.0 mmHg TR Peak grad:   36.0 mmHg                         TR Vmax:        300.00 cm/s  Estimated RAP:  8.00 mmHg                         RVSP:           44.0 mmHg                           SHUNTS                         Systemic VTI:  0.22 m                         Systemic Diam: 1.90 cm  CATH (01/2022) Conclusion      Mid RCA lesion is 15% stenosed.   Ost 2nd Mrg to 2nd Mrg lesion is 50% stenosed.   2nd Mrg lesion is 90% stenosed.   Mid Cx lesion is 70% stenosed.   Prox RCA lesion is 90% stenosed.   Prox LAD to Mid LAD lesion is 50% stenosed.   A drug-eluting stent was successfully placed using a SYNERGY XD 3.0X24.   Post intervention, there is a 0% residual stenosis.   There is severe aortic valve stenosis.   1.  Severe  aortic stenosis with mean transaortic gradient 42 mmHg, calculated aortic valve area 0.6 cm 2.  Two-vessel coronary artery disease with severe stenosis of the proximal RCA, treated with PCI using a 3.0 x 24 mm Synergy DES and severe stenosis of the OM branch of the circumflex with recommendations for medical therapy due to lesion complexity 3.  Moderate nonobstructive diffuse proximal to mid LAD stenosis estimated at 50%    Recent Radiology Findings:   CTA (03/2022) FINDINGS: Aortic Root:   Aortic valve: Trileaflet   Aortic valve calcium score: 2627   Aortic annulus:   Diameter: 72m x 232m  Perimeter: 7577m Area: 424 mm^2   Calcifications: Severe calcifications adjacent to left coronary cusp   Coronary height: Min Left - 57m87min Right - 11mm48minotubular height: Left cusp - 25mm;72mht cusp - 57mm; 60moronary cusp - 22mm   71m (as measured 3 mm below the annulus):   Diameter: 24mm x 211m  Ar65m391 mm^2   Calcifications: Severe calcifications inferior to left coronary cusp   Aortic sinus width: Left cusp - 35mm; Righ8msp - 31mm; Nonco81mry cusp - 32mm   Sinot42mar junction width: 32mm x 29mm  97mimum75moroscopic Angle for Delivery: LAO 13 CRA 23   Cardiac:   Right atrium: Mild enlargement   Right ventricle: Normal size   Pulmonary arteries: Normal size   Pulmonary veins: Normal configuration   Left atrium: Moderate enlargement.  PFO   Left ventricle: Normal size   Pericardium: Normal thickness   Coronary arteries: S/p proximal RCA stent   Mitral valve: Severe mitral annular calcification   IMPRESSION: 1. Trileaflet aortic valve with severe calcifications (AV calcium score 2627)   2. Aortic annulus measures 28mm x 21mm in 59meter53mh perimeter 75mm and area 42466m2. Severe annular calcification adjacent to left coronary cusp extending into LVOT. Annular measurements are suitable for delivery of 23mm Edwards Sapie60mvalve   3.  Low coronary height to RCA (11mm). Sufficient c46mary height to left main (57mm)   4.  Optimum 54mroscopic Angle for Delivery: LAO 13 CRA 23   5.  Severe mitral annular calcifications  6.  PFO   7.  S/p proximal RCA stent     Electronically Signed   By: Oswaldo Milian M.D.   On: 03/17/2022 12:34    Addended by Donato Heinz, MD on 03/17/2022 12:37 PM    Study Result  Narrative & Impression  EXAM: OVER-READ INTERPRETATION  CT CHEST   The following report is a limited chest CT over-read performed by radiologist Dr. Salvatore Marvel of [facility], [place] on 03/17/2022. This over-read does not include interpretation of cardiac or coronary anatomy or pathology. The cardiac CTA interpretation by the cardiologist is attached.   COMPARISON:  04/28/2006 chest CT angiogram. 05/08/2021 chest radiograph.   FINDINGS: Please see the separate concurrent chest CT angiogram report for details.   IMPRESSION: Please see the separate concurrent chest CT angiogram report for details.         Recent Lab Findings: Lab Results  Component Value Date   WBC 6.4 03/11/2022   HGB 15.0 03/11/2022   HGB 15.3 03/11/2022   HCT 44.0 03/11/2022   HCT 45.0 03/11/2022   PLT 169 03/11/2022   GLUCOSE 81 02/24/2022   CHOL 166 05/16/2019   TRIG 279.0 (H) 05/16/2019   HDL 26.40 (L) 05/16/2019   LDLDIRECT 95.0 05/16/2019   LDLCALC 82 10/24/2015   ALT 24 09/17/2020   AST 26 09/17/2020   NA 141 03/11/2022   NA 139 03/11/2022   K 3.8 03/11/2022   K 4.0 03/11/2022   CL 100 02/24/2022   CREATININE 1.32 (H) 02/24/2022   BUN 13 02/24/2022   CO2 24 02/24/2022   TSH 2.33 05/16/2019   INR 2.4 03/27/2022   HGBA1C 6.8 (H) 05/16/2021      Assessment / Plan:        I have spent *** min in review of the records, viewing studies and in face to face with patient and in coordination of future care    Coralie Common 03/29/2022 11:50 AM

## 2022-03-30 ENCOUNTER — Ambulatory Visit (HOSPITAL_COMMUNITY)
Admission: RE | Admit: 2022-03-30 | Discharge: 2022-03-30 | Disposition: A | Discharge status: Home / Self Care | Payer: Medicare Other | Source: Ambulatory Visit | Attending: Cardiology | Admitting: Cardiology

## 2022-03-30 ENCOUNTER — Institutional Professional Consult (permissible substitution) (INDEPENDENT_AMBULATORY_CARE_PROVIDER_SITE_OTHER): Payer: Medicare Other | Admitting: Thoracic Surgery (Cardiothoracic Vascular Surgery)

## 2022-03-30 ENCOUNTER — Encounter: Payer: Medicare Other | Admitting: Family Medicine

## 2022-03-30 ENCOUNTER — Other Ambulatory Visit (HOSPITAL_COMMUNITY): Payer: Self-pay | Admitting: Orthopedic Surgery

## 2022-03-30 ENCOUNTER — Encounter: Payer: Self-pay | Admitting: Thoracic Surgery (Cardiothoracic Vascular Surgery)

## 2022-03-30 VITALS — BP 105/54 | HR 85 | Resp 20 | Ht 66.0 in | Wt 212.0 lb

## 2022-03-30 DIAGNOSIS — M7989 Other specified soft tissue disorders: Secondary | ICD-10-CM | POA: Diagnosis not present

## 2022-03-30 DIAGNOSIS — I350 Nonrheumatic aortic (valve) stenosis: Secondary | ICD-10-CM | POA: Diagnosis not present

## 2022-03-30 DIAGNOSIS — M79605 Pain in left leg: Secondary | ICD-10-CM | POA: Diagnosis not present

## 2022-03-30 NOTE — Patient Instructions (Signed)
Schedule for tavr Stop coumadin 5 days prior to surgery

## 2022-04-01 ENCOUNTER — Other Ambulatory Visit: Payer: Self-pay

## 2022-04-01 DIAGNOSIS — I350 Nonrheumatic aortic (valve) stenosis: Secondary | ICD-10-CM

## 2022-04-03 ENCOUNTER — Ambulatory Visit: Payer: Medicare Other | Attending: Cardiology | Admitting: *Deleted

## 2022-04-03 DIAGNOSIS — Z5181 Encounter for therapeutic drug level monitoring: Secondary | ICD-10-CM | POA: Diagnosis not present

## 2022-04-03 DIAGNOSIS — I482 Chronic atrial fibrillation, unspecified: Secondary | ICD-10-CM

## 2022-04-03 LAB — POCT INR: POC INR: 2.1

## 2022-04-03 NOTE — Patient Instructions (Signed)
Description   continue to take warfarin 1 tablet daily excpet for 1.5 tablets on Sunday and Thursdays. Recheck INR in 1 week post procedure [phone].   Hold warfarin 1/25-1/29  1/30: Procedure Day-Resume warfarin in the evening or as directed by doctor (take an extra half tablet with usual dose for 2 days then resume normal dose).

## 2022-04-10 ENCOUNTER — Ambulatory Visit (HOSPITAL_COMMUNITY)
Admission: RE | Admit: 2022-04-10 | Discharge: 2022-04-10 | Disposition: A | Discharge status: Home / Self Care | Payer: Medicare Other | Source: Ambulatory Visit | Attending: Cardiovascular Disease | Admitting: Cardiovascular Disease

## 2022-04-10 ENCOUNTER — Other Ambulatory Visit: Payer: Self-pay

## 2022-04-10 ENCOUNTER — Encounter (HOSPITAL_COMMUNITY)
Admission: RE | Admit: 2022-04-10 | Discharge: 2022-04-10 | Disposition: A | Discharge status: Home / Self Care | Payer: Medicare Other | Source: Ambulatory Visit | Attending: Cardiovascular Disease | Admitting: Cardiovascular Disease

## 2022-04-10 VITALS — BP 115/68 | HR 81 | Temp 97.4°F | Resp 18 | Ht 66.0 in | Wt 209.8 lb

## 2022-04-10 DIAGNOSIS — Z1152 Encounter for screening for COVID-19: Secondary | ICD-10-CM | POA: Insufficient documentation

## 2022-04-10 DIAGNOSIS — Z01818 Encounter for other preprocedural examination: Secondary | ICD-10-CM | POA: Insufficient documentation

## 2022-04-10 DIAGNOSIS — I350 Nonrheumatic aortic (valve) stenosis: Secondary | ICD-10-CM | POA: Diagnosis not present

## 2022-04-10 LAB — CBC
HCT: 49.3 % (ref 39.0–52.0)
Hemoglobin: 16.5 g/dL (ref 13.0–17.0)
MCH: 29.2 pg (ref 26.0–34.0)
MCHC: 33.5 g/dL (ref 30.0–36.0)
MCV: 87.3 fL (ref 80.0–100.0)
Platelets: 125 10*3/uL — ABNORMAL LOW (ref 150–400)
RBC: 5.65 MIL/uL (ref 4.22–5.81)
RDW: 14.5 % (ref 11.5–15.5)
WBC: 6.3 10*3/uL (ref 4.0–10.5)
nRBC: 0 % (ref 0.0–0.2)

## 2022-04-10 LAB — COMPREHENSIVE METABOLIC PANEL
ALT: 22 U/L (ref 0–44)
AST: 24 U/L (ref 15–41)
Albumin: 4.1 g/dL (ref 3.5–5.0)
Alkaline Phosphatase: 91 U/L (ref 38–126)
Anion gap: 8 (ref 5–15)
BUN: 19 mg/dL (ref 8–23)
CO2: 28 mmol/L (ref 22–32)
Calcium: 9.4 mg/dL (ref 8.9–10.3)
Chloride: 101 mmol/L (ref 98–111)
Creatinine, Ser: 1.52 mg/dL — ABNORMAL HIGH (ref 0.61–1.24)
GFR, Estimated: 44 mL/min — ABNORMAL LOW (ref >60)
Glucose, Bld: 120 mg/dL — ABNORMAL HIGH (ref 70–99)
Potassium: 4.3 mmol/L (ref 3.5–5.1)
Sodium: 137 mmol/L (ref 135–145)
Total Bilirubin: 0.8 mg/dL (ref 0.3–1.2)
Total Protein: 7.9 g/dL (ref 6.5–8.1)

## 2022-04-10 LAB — URINALYSIS, ROUTINE W REFLEX MICROSCOPIC
Bilirubin Urine: NEGATIVE
Glucose, UA: NEGATIVE mg/dL
Hgb urine dipstick: NEGATIVE
Ketones, ur: NEGATIVE mg/dL
Leukocytes,Ua: NEGATIVE
Nitrite: NEGATIVE
Protein, ur: NEGATIVE mg/dL
Specific Gravity, Urine: 1.011 (ref 1.005–1.030)
pH: 6 (ref 5.0–8.0)

## 2022-04-10 LAB — SURGICAL PCR SCREEN
MRSA, PCR: NEGATIVE
Staphylococcus aureus: NEGATIVE

## 2022-04-10 LAB — TYPE AND SCREEN
ABO/RH(D): O POS
Antibody Screen: NEGATIVE

## 2022-04-10 LAB — PROTIME-INR
INR: 1.8 — ABNORMAL HIGH (ref 0.8–1.2)
Prothrombin Time: 21 seconds — ABNORMAL HIGH (ref 11.4–15.2)

## 2022-04-10 LAB — SARS CORONAVIRUS 2 (TAT 6-24 HRS): SARS Coronavirus 2: NEGATIVE

## 2022-04-10 NOTE — Progress Notes (Signed)
Letter with instructions given to patient as well as CHG soap. CHG soap instructions reviewed and patient questions answered.   

## 2022-04-13 MED ORDER — DEXMEDETOMIDINE HCL IN NACL 400 MCG/100ML IV SOLN
0.1000 ug/kg/h | INTRAVENOUS | Status: AC
  Administered 2022-04-14: 47.6 ug via INTRAVENOUS
  Filled 2022-04-13 (×2): qty 100

## 2022-04-13 MED ORDER — MAGNESIUM SULFATE 50 % IJ SOLN
40.0000 meq | INTRAMUSCULAR | Status: DC
  Filled 2022-04-13 (×2): qty 9.85

## 2022-04-13 MED ORDER — CEFAZOLIN SODIUM-DEXTROSE 2-4 GM/100ML-% IV SOLN
2.0000 g | INTRAVENOUS | Status: AC
  Administered 2022-04-14: 2 g via INTRAVENOUS
  Filled 2022-04-13: qty 100

## 2022-04-13 MED ORDER — POTASSIUM CHLORIDE 2 MEQ/ML IV SOLN
80.0000 meq | INTRAVENOUS | Status: DC
  Filled 2022-04-13 (×2): qty 40

## 2022-04-13 MED ORDER — NOREPINEPHRINE 4 MG/250ML-% IV SOLN
0.0000 ug/min | INTRAVENOUS | Status: AC
  Administered 2022-04-14: 2 ug/min via INTRAVENOUS
  Filled 2022-04-13: qty 250

## 2022-04-13 MED ORDER — HEPARIN 30,000 UNITS/1000 ML (OHS) CELLSAVER SOLUTION
Status: DC
  Filled 2022-04-13 (×2): qty 1000

## 2022-04-14 ENCOUNTER — Encounter (HOSPITAL_COMMUNITY): Payer: Self-pay | Admitting: Cardiovascular Disease

## 2022-04-14 ENCOUNTER — Inpatient Hospital Stay (HOSPITAL_COMMUNITY): Payer: Medicare Other

## 2022-04-14 ENCOUNTER — Other Ambulatory Visit: Payer: Self-pay

## 2022-04-14 ENCOUNTER — Inpatient Hospital Stay (HOSPITAL_COMMUNITY): Payer: Medicare Other | Admitting: Physician Assistant

## 2022-04-14 ENCOUNTER — Inpatient Hospital Stay (HOSPITAL_COMMUNITY)
Admission: RE | Admit: 2022-04-14 | Discharge: 2022-04-15 | DRG: 267 | Disposition: A | Discharge status: Home / Self Care | Payer: Medicare Other | Attending: Cardiovascular Disease | Admitting: Cardiovascular Disease

## 2022-04-14 ENCOUNTER — Inpatient Hospital Stay (HOSPITAL_COMMUNITY): Payer: Medicare Other | Admitting: Anesthesiology

## 2022-04-14 ENCOUNTER — Encounter (HOSPITAL_COMMUNITY)
Admission: RE | Disposition: A | Discharge status: Home / Self Care | Payer: Self-pay | Source: Home / Self Care | Attending: Cardiovascular Disease

## 2022-04-14 ENCOUNTER — Other Ambulatory Visit: Payer: Self-pay | Admitting: Physician Assistant

## 2022-04-14 DIAGNOSIS — E1122 Type 2 diabetes mellitus with diabetic chronic kidney disease: Secondary | ICD-10-CM | POA: Diagnosis present

## 2022-04-14 DIAGNOSIS — N189 Chronic kidney disease, unspecified: Secondary | ICD-10-CM | POA: Diagnosis not present

## 2022-04-14 DIAGNOSIS — I482 Chronic atrial fibrillation, unspecified: Secondary | ICD-10-CM | POA: Diagnosis present

## 2022-04-14 DIAGNOSIS — I25118 Atherosclerotic heart disease of native coronary artery with other forms of angina pectoris: Secondary | ICD-10-CM | POA: Diagnosis present

## 2022-04-14 DIAGNOSIS — Z79899 Other long term (current) drug therapy: Secondary | ICD-10-CM | POA: Diagnosis not present

## 2022-04-14 DIAGNOSIS — I4510 Unspecified right bundle-branch block: Secondary | ICD-10-CM | POA: Diagnosis present

## 2022-04-14 DIAGNOSIS — G4733 Obstructive sleep apnea (adult) (pediatric): Secondary | ICD-10-CM | POA: Diagnosis present

## 2022-04-14 DIAGNOSIS — Z87891 Personal history of nicotine dependence: Secondary | ICD-10-CM

## 2022-04-14 DIAGNOSIS — Z880 Allergy status to penicillin: Secondary | ICD-10-CM | POA: Diagnosis not present

## 2022-04-14 DIAGNOSIS — E1142 Type 2 diabetes mellitus with diabetic polyneuropathy: Secondary | ICD-10-CM | POA: Diagnosis not present

## 2022-04-14 DIAGNOSIS — I4821 Permanent atrial fibrillation: Secondary | ICD-10-CM | POA: Diagnosis not present

## 2022-04-14 DIAGNOSIS — Z820 Family history of epilepsy and other diseases of the nervous system: Secondary | ICD-10-CM

## 2022-04-14 DIAGNOSIS — Z006 Encounter for examination for normal comparison and control in clinical research program: Secondary | ICD-10-CM

## 2022-04-14 DIAGNOSIS — K219 Gastro-esophageal reflux disease without esophagitis: Secondary | ICD-10-CM | POA: Diagnosis present

## 2022-04-14 DIAGNOSIS — Z885 Allergy status to narcotic agent status: Secondary | ICD-10-CM | POA: Diagnosis not present

## 2022-04-14 DIAGNOSIS — E669 Obesity, unspecified: Secondary | ICD-10-CM | POA: Diagnosis not present

## 2022-04-14 DIAGNOSIS — M109 Gout, unspecified: Secondary | ICD-10-CM | POA: Diagnosis present

## 2022-04-14 DIAGNOSIS — Z886 Allergy status to analgesic agent status: Secondary | ICD-10-CM

## 2022-04-14 DIAGNOSIS — Z7902 Long term (current) use of antithrombotics/antiplatelets: Secondary | ICD-10-CM

## 2022-04-14 DIAGNOSIS — E785 Hyperlipidemia, unspecified: Secondary | ICD-10-CM | POA: Diagnosis present

## 2022-04-14 DIAGNOSIS — I129 Hypertensive chronic kidney disease with stage 1 through stage 4 chronic kidney disease, or unspecified chronic kidney disease: Secondary | ICD-10-CM | POA: Diagnosis present

## 2022-04-14 DIAGNOSIS — I2510 Atherosclerotic heart disease of native coronary artery without angina pectoris: Secondary | ICD-10-CM

## 2022-04-14 DIAGNOSIS — Z7901 Long term (current) use of anticoagulants: Secondary | ICD-10-CM

## 2022-04-14 DIAGNOSIS — Z8249 Family history of ischemic heart disease and other diseases of the circulatory system: Secondary | ICD-10-CM

## 2022-04-14 DIAGNOSIS — Z955 Presence of coronary angioplasty implant and graft: Secondary | ICD-10-CM

## 2022-04-14 DIAGNOSIS — Z952 Presence of prosthetic heart valve: Principal | ICD-10-CM

## 2022-04-14 DIAGNOSIS — N1832 Chronic kidney disease, stage 3b: Secondary | ICD-10-CM | POA: Diagnosis not present

## 2022-04-14 DIAGNOSIS — Z881 Allergy status to other antibiotic agents status: Secondary | ICD-10-CM | POA: Diagnosis not present

## 2022-04-14 DIAGNOSIS — Z6832 Body mass index (BMI) 32.0-32.9, adult: Secondary | ICD-10-CM

## 2022-04-14 DIAGNOSIS — Z89421 Acquired absence of other right toe(s): Secondary | ICD-10-CM

## 2022-04-14 DIAGNOSIS — I10 Essential (primary) hypertension: Secondary | ICD-10-CM | POA: Diagnosis present

## 2022-04-14 DIAGNOSIS — Z888 Allergy status to other drugs, medicaments and biological substances status: Secondary | ICD-10-CM | POA: Diagnosis not present

## 2022-04-14 DIAGNOSIS — Z800 Family history of malignant neoplasm of digestive organs: Secondary | ICD-10-CM

## 2022-04-14 DIAGNOSIS — I350 Nonrheumatic aortic (valve) stenosis: Principal | ICD-10-CM

## 2022-04-14 DIAGNOSIS — N183 Chronic kidney disease, stage 3 unspecified: Secondary | ICD-10-CM | POA: Diagnosis present

## 2022-04-14 HISTORY — PX: INTRAOPERATIVE TRANSTHORACIC ECHOCARDIOGRAM: SHX6523

## 2022-04-14 HISTORY — DX: Presence of prosthetic heart valve: Z95.2

## 2022-04-14 HISTORY — PX: TRANSCATHETER AORTIC VALVE REPLACEMENT, TRANSFEMORAL: SHX6400

## 2022-04-14 LAB — ECHOCARDIOGRAM LIMITED
AR max vel: 2.99 cm2
AV Area VTI: 3.21 cm2
AV Area mean vel: 1.36 cm2
AV Mean grad: 3 mmHg
AV Peak grad: 4.8 mmHg
Ao pk vel: 1.09 m/s

## 2022-04-14 LAB — PROTIME-INR
INR: 1.2 (ref 0.8–1.2)
Prothrombin Time: 14.7 seconds (ref 11.4–15.2)

## 2022-04-14 LAB — GLUCOSE, CAPILLARY
Glucose-Capillary: 124 mg/dL — ABNORMAL HIGH (ref 70–99)
Glucose-Capillary: 122 mg/dL — ABNORMAL HIGH (ref 70–99)

## 2022-04-14 LAB — POCT I-STAT, CHEM 8
BUN: 18 mg/dL (ref 8–23)
Calcium, Ion: 1.21 mmol/L (ref 1.15–1.40)
Chloride: 102 mmol/L (ref 98–111)
Creatinine, Ser: 1.2 mg/dL (ref 0.61–1.24)
Glucose, Bld: 132 mg/dL — ABNORMAL HIGH (ref 70–99)
HCT: 44 % (ref 39.0–52.0)
Hemoglobin: 15 g/dL (ref 13.0–17.0)
Potassium: 4.3 mmol/L (ref 3.5–5.1)
Sodium: 139 mmol/L (ref 135–145)
TCO2: 25 mmol/L (ref 22–32)

## 2022-04-14 LAB — POCT ACTIVATED CLOTTING TIME
Activated Clotting Time: 131 seconds
Activated Clotting Time: 353 seconds

## 2022-04-14 SURGERY — IMPLANTATION, AORTIC VALVE, TRANSCATHETER, FEMORAL APPROACH
Anesthesia: Monitor Anesthesia Care

## 2022-04-14 MED ORDER — CHLORHEXIDINE GLUCONATE 0.12 % MT SOLN
OROMUCOSAL | Status: AC
  Administered 2022-04-14: 15 mL
  Filled 2022-04-14: qty 15

## 2022-04-14 MED ORDER — TRAMADOL HCL 50 MG PO TABS: 50.0000 mg | ORAL_TABLET | ORAL | Status: DC | PRN

## 2022-04-14 MED ORDER — HEPARIN (PORCINE) IN NACL 1000-0.9 UT/500ML-% IV SOLN
INTRAVENOUS | Status: AC
  Filled 2022-04-14: qty 500

## 2022-04-14 MED ORDER — PROPOFOL 500 MG/50ML IV EMUL
INTRAVENOUS | Status: DC | PRN
  Administered 2022-04-14: 20 ug/kg/min via INTRAVENOUS

## 2022-04-14 MED ORDER — LACTATED RINGERS IV SOLN: INTRAVENOUS | Status: DC

## 2022-04-14 MED ORDER — CHLORHEXIDINE GLUCONATE 4 % EX LIQD: 60.0000 mL | Freq: Once | CUTANEOUS | Status: DC

## 2022-04-14 MED ORDER — LIDOCAINE HCL 1 % IJ SOLN
INTRAMUSCULAR | Status: AC
  Filled 2022-04-14: qty 20

## 2022-04-14 MED ORDER — TAMSULOSIN HCL 0.4 MG PO CAPS
0.4000 mg | ORAL_CAPSULE | Freq: Every day | ORAL | Status: DC
  Administered 2022-04-14 – 2022-04-15 (×2): 0.4 mg via ORAL
  Filled 2022-04-14 (×2): qty 1

## 2022-04-14 MED ORDER — PANTOPRAZOLE SODIUM 40 MG PO TBEC
40.0000 mg | DELAYED_RELEASE_TABLET | Freq: Every day | ORAL | Status: DC
  Administered 2022-04-14 – 2022-04-15 (×2): 40 mg via ORAL
  Filled 2022-04-14 (×2): qty 1

## 2022-04-14 MED ORDER — HEPARIN (PORCINE) IN NACL 1000-0.9 UT/500ML-% IV SOLN
INTRAVENOUS | Status: DC | PRN
  Administered 2022-04-14 (×3): 500 mL

## 2022-04-14 MED ORDER — PHENYLEPHRINE 80 MCG/ML (10ML) SYRINGE FOR IV PUSH (FOR BLOOD PRESSURE SUPPORT)
PREFILLED_SYRINGE | INTRAVENOUS | Status: DC | PRN
  Administered 2022-04-14: 160 ug via INTRAVENOUS

## 2022-04-14 MED ORDER — FUROSEMIDE 20 MG PO TABS
20.0000 mg | ORAL_TABLET | Freq: Every day | ORAL | Status: DC
  Administered 2022-04-14 – 2022-04-15 (×2): 20 mg via ORAL
  Filled 2022-04-14 (×2): qty 1

## 2022-04-14 MED ORDER — SODIUM CHLORIDE 0.9 % IV SOLN: INTRAVENOUS | Status: DC

## 2022-04-14 MED ORDER — SODIUM CHLORIDE 0.9 % IV SOLN: INTRAVENOUS | Status: AC

## 2022-04-14 MED ORDER — SODIUM CHLORIDE 0.9% FLUSH
3.0000 mL | Freq: Two times a day (BID) | INTRAVENOUS | Status: DC
  Administered 2022-04-14 – 2022-04-15 (×2): 3 mL via INTRAVENOUS

## 2022-04-14 MED ORDER — CHLORHEXIDINE GLUCONATE 4 % EX LIQD
30.0000 mL | CUTANEOUS | Status: DC
  Filled 2022-04-14: qty 30

## 2022-04-14 MED ORDER — ONDANSETRON HCL 4 MG/2ML IJ SOLN: 4.0000 mg | Freq: Four times a day (QID) | INTRAMUSCULAR | Status: DC | PRN

## 2022-04-14 MED ORDER — MORPHINE SULFATE (PF) 2 MG/ML IV SOLN: 1.0000 mg | INTRAVENOUS | Status: DC | PRN

## 2022-04-14 MED ORDER — ACETAMINOPHEN 650 MG RE SUPP: 650.0000 mg | Freq: Four times a day (QID) | RECTAL | Status: DC | PRN

## 2022-04-14 MED ORDER — ACETAMINOPHEN 325 MG PO TABS: 650.0000 mg | ORAL_TABLET | Freq: Four times a day (QID) | ORAL | Status: DC | PRN

## 2022-04-14 MED ORDER — NITROGLYCERIN IN D5W 200-5 MCG/ML-% IV SOLN
0.0000 ug/min | INTRAVENOUS | Status: DC
  Filled 2022-04-14: qty 250

## 2022-04-14 MED ORDER — DILTIAZEM HCL ER COATED BEADS 180 MG PO CP24
180.0000 mg | ORAL_CAPSULE | Freq: Every day | ORAL | Status: DC
  Administered 2022-04-14 – 2022-04-15 (×2): 180 mg via ORAL
  Filled 2022-04-14 (×2): qty 1

## 2022-04-14 MED ORDER — SODIUM CHLORIDE 0.9 % IV SOLN: 250.0000 mL | INTRAVENOUS | Status: DC | PRN

## 2022-04-14 MED ORDER — LISINOPRIL 10 MG PO TABS
10.0000 mg | ORAL_TABLET | Freq: Every day | ORAL | Status: DC
  Administered 2022-04-14 – 2022-04-15 (×2): 10 mg via ORAL
  Filled 2022-04-14 (×2): qty 1

## 2022-04-14 MED ORDER — LIDOCAINE HCL (PF) 1 % IJ SOLN
INTRAMUSCULAR | Status: DC | PRN
  Administered 2022-04-14 (×2): 10 mL

## 2022-04-14 MED ORDER — OXYCODONE HCL 5 MG PO TABS: 5.0000 mg | ORAL_TABLET | ORAL | Status: DC | PRN

## 2022-04-14 MED ORDER — CHLORHEXIDINE GLUCONATE 0.12 % MT SOLN
15.0000 mL | Freq: Once | OROMUCOSAL | Status: AC
  Filled 2022-04-14: qty 15

## 2022-04-14 MED ORDER — CEFAZOLIN SODIUM-DEXTROSE 2-4 GM/100ML-% IV SOLN
2.0000 g | Freq: Three times a day (TID) | INTRAVENOUS | Status: AC
  Administered 2022-04-14 – 2022-04-15 (×2): 2 g via INTRAVENOUS
  Filled 2022-04-14 (×2): qty 100

## 2022-04-14 MED ORDER — ALLOPURINOL 100 MG PO TABS
100.0000 mg | ORAL_TABLET | Freq: Every day | ORAL | Status: DC
  Administered 2022-04-14 – 2022-04-15 (×2): 100 mg via ORAL
  Filled 2022-04-14 (×2): qty 1

## 2022-04-14 MED ORDER — FENTANYL CITRATE (PF) 100 MCG/2ML IJ SOLN
INTRAMUSCULAR | Status: DC | PRN
  Administered 2022-04-14: 100 ug via INTRAVENOUS

## 2022-04-14 MED ORDER — INSULIN ASPART 100 UNIT/ML IJ SOLN: 0.0000 [IU] | INTRAMUSCULAR | Status: DC | PRN

## 2022-04-14 MED ORDER — SODIUM CHLORIDE 0.9% FLUSH: 3.0000 mL | INTRAVENOUS | Status: DC | PRN

## 2022-04-14 MED ORDER — IOHEXOL 350 MG/ML SOLN
INTRAVENOUS | Status: DC | PRN
  Administered 2022-04-14: 40 mL

## 2022-04-14 MED ORDER — METOPROLOL TARTRATE 12.5 MG HALF TABLET
12.5000 mg | ORAL_TABLET | Freq: Two times a day (BID) | ORAL | Status: DC
  Administered 2022-04-14 – 2022-04-15 (×2): 12.5 mg via ORAL
  Filled 2022-04-14 (×2): qty 1

## 2022-04-14 MED ORDER — HEPARIN SODIUM (PORCINE) 1000 UNIT/ML IJ SOLN
INTRAMUSCULAR | Status: DC | PRN
  Administered 2022-04-14: 14000 [IU] via INTRAVENOUS

## 2022-04-14 MED ORDER — PROTAMINE SULFATE 10 MG/ML IV SOLN
INTRAVENOUS | Status: DC | PRN
  Administered 2022-04-14: 140 mg via INTRAVENOUS

## 2022-04-14 MED ORDER — CLOPIDOGREL BISULFATE 75 MG PO TABS
75.0000 mg | ORAL_TABLET | Freq: Every day | ORAL | Status: DC
  Administered 2022-04-14 – 2022-04-15 (×2): 75 mg via ORAL
  Filled 2022-04-14 (×2): qty 1

## 2022-04-14 SURGICAL SUPPLY — 31 items
BAG SNAP BAND KOVER 36X36 (MISCELLANEOUS) ×4 IMPLANT
CABLE ADAPT PACING TEMP 12FT (ADAPTER) IMPLANT
CATH 23 ULTRA DELIVERY (CATHETERS) IMPLANT
CATH DIAG 6FR PIGTAIL ANGLED (CATHETERS) IMPLANT
CATH INFINITI 6F AL2 (CATHETERS) IMPLANT
CATH S G BIP PACING (CATHETERS) IMPLANT
CLOSURE PERCLOSE PROSTYLE (VASCULAR PRODUCTS) IMPLANT
CRIMPER (MISCELLANEOUS) IMPLANT
DEVICE INFLATION ATRION QL2530 (MISCELLANEOUS) IMPLANT
KIT HEART LEFT (KITS) ×2 IMPLANT
KIT MICROPUNCTURE NIT STIFF (SHEATH) IMPLANT
KIT SAPIAN 3 ULTRA RESILIA 23 (Valve) IMPLANT
PACK CARDIAC CATHETERIZATION (CUSTOM PROCEDURE TRAY) ×2 IMPLANT
PROTECTION STATION PRESSURIZED (MISCELLANEOUS) ×2
SHEATH BRITE TIP 7FR 35CM (SHEATH) IMPLANT
SHEATH INTRODUCER SET 20-26 (SHEATH) IMPLANT
SHEATH PINNACLE 6F 10CM (SHEATH) IMPLANT
SHEATH PINNACLE 8F 10CM (SHEATH) IMPLANT
SHEATH PROBE COVER 6X72 (BAG) IMPLANT
SLEEVE REPOSITIONING LENGTH 30 (MISCELLANEOUS) IMPLANT
STATION PROTECTION PRESSURIZED (MISCELLANEOUS) IMPLANT
STOPCOCK MORSE 400PSI 3WAY (MISCELLANEOUS) ×4 IMPLANT
SYR MEDRAD MARK V 150ML (SYRINGE) IMPLANT
TRANSDUCER W/STOPCOCK (MISCELLANEOUS) ×4 IMPLANT
TUBING CIL FLEX 10 FLL-RA (TUBING) IMPLANT
TUBING CONTRAST HIGH PRESS 48 (TUBING) IMPLANT
WIRE AMPLATZ SS-J .035X180CM (WIRE) IMPLANT
WIRE EMERALD 3MM-J .035X150CM (WIRE) IMPLANT
WIRE EMERALD 3MM-J .035X260CM (WIRE) IMPLANT
WIRE EMERALD ST .035X260CM (WIRE) IMPLANT
WIRE SAFARI SM CURVE 275 (WIRE) IMPLANT

## 2022-04-14 NOTE — Progress Notes (Signed)
Echocardiogram 2D Echocardiogram has been performed.  [name] 04/14/2022, 2:18 PM

## 2022-04-14 NOTE — Anesthesia Preprocedure Evaluation (Addendum)
Anesthesia Evaluation    Airway Mallampati: II  TM Distance: >3 FB Neck ROM: Full    Dental no notable dental hx.    Pulmonary sleep apnea and Continuous Positive Airway Pressure Ventilation , former smoker   Pulmonary exam normal        Cardiovascular hypertension, Pt. on medications and Pt. on home beta blockers + CAD and + Cardiac Stents  + Valvular Problems/Murmurs AS  Rhythm:Regular Rate:Normal + Systolic murmurs ECHO:   1. Left ventricular ejection fraction, by estimation, is 65 to 70%. The  left ventricle has normal function. The left ventricle has no regional  wall motion abnormalities. There is mild left ventricular hypertrophy.  Left ventricular diastolic parameters  are indeterminate.   2. Right ventricular systolic function is mildly reduced. The right  ventricular size is normal. There is mildly elevated pulmonary artery  systolic pressure. The estimated right ventricular systolic pressure is  09.4 mmHg.   3. Left atrial size was mildly dilated.   4. The mitral valve is degenerative. Trivial mitral valve regurgitation.  Mild mitral stenosis. The mean mitral valve gradient is 4.0 mmHg with  average heart rate of 80 bpm. Severe mitral annular calcification.   5. The inferior vena cava is normal in size with greater than 50%  respiratory variability, suggesting right atrial pressure of 3 mmHg.   6. The aortic valve is calcified. There is severe calcifcation of the  aortic valve. Aortic valve regurgitation is not visualized. Severe aortic  valve stenosis. Vmax 4.0 m/s, MG 35 mmHg, AVA 0.8 cm^2, DI 0.26     Neuro/Psych negative neurological ROS  negative psych ROS   GI/Hepatic Neg liver ROS, PUD,GERD  Medicated,,  Endo/Other  diabetes    Renal/GU CRFRenal disease  negative genitourinary   Musculoskeletal  (+) Arthritis ,    Abdominal Normal abdominal exam  (+)   Peds  Hematology negative hematology  ROS (+)   Anesthesia Other Findings   Reproductive/Obstetrics                             Anesthesia Physical Anesthesia Plan  ASA: 4  Anesthesia Plan: MAC   Post-op Pain Management:    Induction: Intravenous  PONV Risk Score and Plan: 1 and Ondansetron, Dexamethasone and Treatment may vary due to age or medical condition  Airway Management Planned: Simple Face Mask  Additional Equipment: Arterial line  Intra-op Plan:   Post-operative Plan:   Informed Consent: I have reviewed the patients History and Physical, chart, labs and discussed the procedure including the risks, benefits and alternatives for the proposed anesthesia with the patient or authorized representative who has indicated his/her understanding and acceptance.     Dental advisory given  Plan Discussed with: CRNA  Anesthesia Plan Comments: (Lab Results      Component                Value               Date                      WBC                      6.3                 04/10/2022                HGB  16.5                04/10/2022                HCT                      49.3                04/10/2022                MCV                      87.3                04/10/2022                PLT                      125 (L)             04/10/2022             Lab Results      Component                Value               Date                      NA                       137                 04/10/2022                K                        4.3                 04/10/2022                CO2                      28                  04/10/2022                GLUCOSE                  120 (H)             04/10/2022                BUN                      19                  04/10/2022                CREATININE               1.52 (H)            04/10/2022                CALCIUM                  9.4  04/10/2022                EGFR                     52 (L)               02/24/2022                GFRNONAA                 44 (L)              04/10/2022           )       Anesthesia Quick Evaluation

## 2022-04-14 NOTE — Progress Notes (Signed)
LOCATION: right RADIAL a-line  DRESSING APPLIED: a-line removed, manual pressure applied for approximately 6 minutes, tolerated well, gauze with tegaderm placed after manual hold completed  SITE UPON ARRIVAL: LEVEL 0  SITE AFTER BAND REMOVAL: LEVEL 0  CIRCULATION SENSATION AND MOVEMENT: +2 bilateral radial pulse, + movement noted  COMMENTS: post removal instructions given to pt regarding right wrist and bilateral groins, drsgs x24 hours, no sitting in hot tubs, bath tubs, or pools, keep sites dry, no heavy lifting, running etc, verbal acknowledgement noted

## 2022-04-14 NOTE — Transfer of Care (Signed)
Immediate Anesthesia Transfer of Care Note  Patient: [name]  Procedure(s) Performed: Transcatheter Aortic Valve Replacement, Transfemoral (Left) INTRAOPERATIVE TRANSTHORACIC ECHOCARDIOGRAM  Patient Location: Cath Lab  Anesthesia Type:MAC  Level of Consciousness: drowsy and patient cooperative  Airway & Oxygen Therapy: Patient Spontanous Breathing and Patient connected to nasal cannula oxygen  Post-op Assessment: Report given to RN and Post -op Vital signs reviewed and stable  Post vital signs: Reviewed and stable  Last Vitals:  Vitals Value Taken Time  BP    Temp    Pulse 64 04/14/22 1452  Resp 18 04/14/22 1452  SpO2 90 % 04/14/22 1452  Vitals shown include unvalidated device data.  Last Pain:  Vitals:   04/14/22 0950  TempSrc:   PainSc: 0-No pain         Complications: There were no known notable events for this encounter.

## 2022-04-14 NOTE — H&P (Signed)
[address]       [place] [address]             [place] Record #[id number] Date of Birth: [birth year]   Sherren Mocha, MD Tonia Ghent, MD   Chief Complaint:   Increasing DOE, CP and lightheadedness   History of Present Illness:     Pt is a very pleasant 89 yo wm who has chronic atrial fibrillation on coumadin and rate control who has been followed also by Dr Burt Knack for moderate to severe AS. He was felt to be well controlled symptom wise this past summer and on reevaluation after 6 months, pt began having exertional DOE, CP and lightheadedness more. He would also have CP at rest. Pt had echo with worsening AS with Vmax of 28ms and MG of 372mg and a valve area of 0.8cm2. He had this confirmed at cath with also significant RCA stenosis that has a PCI and moderate OM and Lad disease felt best treated medically. Pt was felt to be a TAVR candidate and CTA was performed with adequate access and valve anatomy. Of note, after PCI he has had less CP and DOE but still with lightheadedness.             Past Medical History:  Diagnosis Date   Arthritis     Bradycardia      a. nocturnal with pauses overnight on tele likely due to OSA 10/2015.   CKD (chronic kidney disease), stage III (HCC)     Coronary artery disease      a. prev nonobst. b. LHC 10/24/15: occlusion of small diffusely diseased OM2 s/p balloon angioplasty, mild nonobstructive disease of mLAD and mRCA, normal LVEDP   Diabetes mellitus without complication ([facility]     1025/36/66t's daughter states patient does not  have diabetes.  A1C 07/23/20 was 7.2, patientr had been on medication.   Diabetic peripheral neuropathy ([facility]/21/2021   Diverticulosis     Esophageal reflux     Esophageal stricture from GERD 06/08/2011    With stricture at GE junction on EGD, dilated 06/2011    Gastric ulcer      on EGD 2013   Gout, unspecified       "on daily RX" (09/09/2017)   History of blood transfusion 02/1964    "when I had right little finger cut off"   Hyperlipidemia     Hypertension     Obesity, unspecified     OSA on CPAP     Paroxysmal nocturnal dyspnea     Persistent atrial fibrillation (HCC)     Pneumonia     Severe aortic stenosis     Vertigo             Past Surgical History:  Procedure Laterality Date   AMPUTATION Right 03/03/2019    Procedure: RIGHT 2ND TOE AMPUTATION;  Surgeon: DuNewt MinionMD;  [facility] Service: [facility]opedics;  Laterality: Right;   AMPUTATION Right 01/03/2021    Procedure: RIGHT 3RD TOE AMPUTATION;  Surgeon: DuNewt MinionMD;  [facility] Service: [facility];  Laterality: Right;   BALLOON DILATION N/A 10/22/2014    Procedure: BALLOON DILATION;  Surgeon: CaGatha MayerMD;  [facility]: [facility];  Service: Endoscopy;  Laterality: N/A;   CARDIAC CATHETERIZATION   07/2008   CARDIAC CATHETERIZATION N/A 10/24/2015    Procedure: Left Heart Cath and Coronary Angiography;  Surgeon: [name], MD;  Location: [facility];  Service: Cardiovascular;  Laterality: N/A;   CARDIAC CATHETERIZATION N/A 10/24/2015    Procedure: Coronary Balloon Angioplasty;  Surgeon: [name], MD;  Location: [facility];  Service: Cardiovascular;  Laterality: N/A;   Carotid Dopplers   09/2009    no sig extracranial stenosis and vertebral arteries had antegrade flow   CATARACT EXTRACTION W/ INTRAOCULAR LENS  IMPLANT, BILATERAL Bilateral     CORONARY STENT INTERVENTION N/A 03/11/2022    Procedure: CORONARY STENT INTERVENTION;  Surgeon: Sherren Mocha, MD;  Location: [facility];  Service: Cardiovascular;  Laterality: N/A;   ESOPHAGOGASTRODUODENOSCOPY (EGD) WITH ESOPHAGEAL DILATION        "I've had it stretched 3 times" (10/23/2015)   ESOPHAGOGASTRODUODENOSCOPY (EGD) WITH ESOPHAGEAL DILATION   06/2011    /medical hx above  (09/09/2017)   ESOPHAGOGASTRODUODENOSCOPY (EGD) WITH  PROPOFOL N/A 10/22/2014    Procedure: ESOPHAGOGASTRODUODENOSCOPY (EGD) WITH PROPOFOL;  Surgeon: [name], MD;  Location: [facility];  Service: Endoscopy;  Laterality: N/A;   FINGER SURGERY Right 02/1964    "cut little finger off; had it reattached"   I & D EXTREMITY Right 09/10/2017    Procedure: RIGHT FOOT DEBRIDEMENT;  Surgeon: [name], MD;  Location: [facility];  Service: Orthopedics;  Laterality: Right;   MRI of brain   09/2009    chronic microvascular ischemia   OSA Sleep Study        per Dr. Brett Fairy   RIGHT/LEFT HEART CATH AND CORONARY ANGIOGRAPHY N/A 03/11/2022    Procedure: RIGHT/LEFT HEART CATH AND CORONARY ANGIOGRAPHY;  Surgeon: Sherren Mocha, MD;  Location: [facility];  Service: Cardiovascular;  Laterality: N/A;   SHOULDER ARTHROSCOPY W/ ROTATOR CUFF REPAIR Right 2008      Social History        Tobacco Use  Smoking Status Former   Packs/day: 0.12   Years: 4.00   Total pack years: 0.48   Types: Cigarettes   Quit date: 03/16/1969   Years since quitting: 53.0  Smokeless Tobacco Former  Tobacco Comments    "chewed when I smoked; smoked 1 pack/week for about 4 years, quit 1971    Social History        Substance and Sexual Activity  Alcohol Use Not Currently   Alcohol/week: 0.0 standard drinks of alcohol    Comment: 09/09/2017  "Quit all alcohol in 1970's"      Social History         Socioeconomic History   Marital status: Widowed      Spouse name: Not on file   Number of children: 3   Years of education: Not on file   Highest education level: Not on file  Occupational History   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  Tobacco Use   Smoking status: Former      Packs/day: 0.12      Years: 4.00      Total pack years: 0.48      Types: Cigarettes      Quit date: 03/16/1969      Years since quitting: 53.0   Smokeless tobacco: Former   Tobacco comments:      "chewed when I smoked; smoked 1 pack/week for about  4 years,  quit 1971  Vaping Use   Vaping Use: Never used  Substance and Sexual Activity   Alcohol use: Not Currently      Alcohol/week: 0.0 standard drinks of alcohol      Comment: 09/09/2017  "Quit all alcohol in 1970's"   Drug use: Never   Sexual activity: Not on file  Other Topics Concern   Not on file  Social History Narrative    Lives in [place], married 1953, widowed after 28 years    Retired Administrator, still does Biomedical scientist.    He is not routinely exercising.    Daily caffeine     Social Determinants of Health        Financial Resource Strain: Low Risk  (11/27/2021)    Overall Financial Resource Strain (CARDIA)     Difficulty of Paying Living Expenses: Not hard at all  Food Insecurity: No Food Insecurity (11/27/2021)    Hunger Vital Sign     Worried About Running Out of Food in the Last Year: Never true     Ran Out of Food in the Last Year: Never true  Transportation Needs: No Transportation Needs (11/27/2021)    PRAPARE - Armed forces logistics/support/administrative officer (Medical): No     Lack of Transportation (Non-Medical): No  Physical Activity: Insufficiently Active (11/27/2021)    Exercise Vital Sign     Days of Exercise per Week: 7 days     Minutes of Exercise per Session: 20 min  Stress: No Stress Concern Present (11/27/2021)    [place]     Feeling of Stress : Not at all  Social Connections: Moderately Integrated (11/27/2021)    Social Connection and Isolation Panel [NHANES]     Frequency of Communication with Friends and Family: Twice a week     Frequency of Social Gatherings with Friends and Family: Twice a week     Attends Religious Services: 1 to 4 times per year     Active Member of [employer] or Organizations: No     Attends Music therapist: More than 4 times per year     Marital Status: Widowed  Intimate Partner Violence: Not At Risk (11/27/2021)    Humiliation, Afraid, Rape, and Kick  questionnaire     Fear of Current or Ex-Partner: No     Emotionally Abused: No     Physically Abused: No     Sexually Abused: No           Allergies  Allergen Reactions   Penicillins Shortness Of Breath and Swelling      Patient tolerated Rocephin injection 2019   PATIENT HAS HAD A PCN REACTION WITH IMMEDIATE RASH, FACIAL/TONGUE/THROAT SWELLING, SOB, OR LIGHTHEADEDNESS WITH HYPOTENSION:  #  #  YES  #  Has patient had a PCN reaction causing severe rash involving mucus membranes or skin necrosis: NO Has patient had a PCN reaction that required hospitalization NO Has patient had a PCN reaction occurring within the last 10 years: NO If all of the above answers are "NO", then may proceed with Cephalosporin use.     Atorvastatin Other (See Comments)      No energy, just felt bad       Pravastatin Other (See Comments)      myalgias   Atrovent Nasal Spray [Ipratropium]        Dizzy with use   Colchicine  Due to renal function- can use prednisone if needed    Hydrocodone Other (See Comments)      "swimmy headed" after use   Nsaids        Due to renal function   Uloric [Febuxostat] Other (See Comments)      Would avoid, worsening gout sx with use.    Vancomycin Other (See Comments)      Red man syndrome- tolerated with slow run and benadryl use   Clarithromycin Diarrhea and Other (See Comments)      Other reaction(s): Not available   Doxycycline Other (See Comments)      GI upset              Current Outpatient Medications  Medication Sig Dispense Refill   acetaminophen (TYLENOL) 500 MG tablet Take 1,000 mg by mouth every 8 (eight) hours as needed for mild pain.       allopurinol (ZYLOPRIM) 100 MG tablet TAKE 1 TABLET BY MOUTH EVERY DAY 90 tablet 3   clopidogrel (PLAVIX) 75 MG tablet Take 1 tablet (75 mg total) by mouth daily. 30 tablet 5   colchicine 0.6 MG tablet TAKE 1 TABLET BY MOUTH ONCE DAILY AS NEEDED. CAN TAKE 2ND TABLET ONCE DAILY AS NEEDED FOR GOUT FLARE        cyanocobalamin (,VITAMIN B-12,) 1000 MCG/ML injection 1047mg IM every 28 days 1 mL     diltiazem (CARDIZEM CD) 180 MG 24 hr capsule Take 1 capsule (180 mg total) by mouth daily. 90 capsule 3   enoxaparin (LOVENOX) 150 MG/ML injection Inject 1 mL (150 mg total) into the skin daily. 10 mL 1   furosemide (LASIX) 20 MG tablet Take 1 tablet (20 mg total) by mouth daily. 90 tablet 3   lisinopril (ZESTRIL) 20 MG tablet Take 10 mg by mouth in the morning and at bedtime.       metoprolol tartrate (LOPRESSOR) 25 MG tablet Take 0.5 tablets (12.5 mg total) by mouth 2 (two) times daily. 90 tablet 3   pantoprazole (PROTONIX) 40 MG tablet Take 1 tablet (40 mg total) by mouth daily. 30 tablet 1   tamsulosin (FLOMAX) 0.4 MG CAPS capsule TAKE 1 CAPSULE BY MOUTH EVERY DAY 90 capsule 3   triamcinolone cream (KENALOG) 0.1 % Apply 1 Application topically 2 (two) times daily as needed (irritation).       warfarin (COUMADIN) 4 MG tablet TAKE 1 TO 1 AND 1/2 TABLETS BY MOUTH DAILY OR AS DIRECTED BY COUMADIN CLINIC (Patient taking differently: Take 4-6 mg by mouth See admin instructions. Take 1 to 1 and 1/2 tablets by mouth daily or as directed by Coumadin Clinic;   6 mg every Sun, Thu; 4 mg all other days) 120 tablet 1    No current facility-administered medications for this visit.             Family History  Problem Relation Age of Onset   Stomach cancer Mother          died in 6102's   Hypertension Father          died in his 725'spna likely dementia   Dementia Father     Pneumonia Father     Heart disease Brother          S/P CABG   Sleep apnea Brother     Sleep apnea Daughter     Hypertension Other     Prostate cancer Neg Hx     Colon cancer Neg Hx  Physical Exam: Lungs: clear Card: irr with 3-4/6 high pitched sem Ext: bruising on left calf Neuro: alert and no focal deficits. Walks with cane Teeth. No issues         Diagnostic Studies & Laboratory data: I have personally reviewed  the following studies and agree with the findings   TTE (01/2022) IMPRESSIONS   1. Left ventricular ejection fraction, by estimation, is 65 to 70%. The  left ventricle has normal function. The left ventricle has no regional  wall motion abnormalities. There is mild left ventricular hypertrophy.  Left ventricular diastolic parameters  are indeterminate.   2. Right ventricular systolic function is mildly reduced. The right  ventricular size is normal. There is mildly elevated pulmonary artery  systolic pressure. The estimated right ventricular systolic pressure is  32.5 mmHg.   3. Left atrial size was mildly dilated.   4. The mitral valve is degenerative. Trivial mitral valve regurgitation.  Mild mitral stenosis. The mean mitral valve gradient is 4.0 mmHg with  average heart rate of 80 bpm. Severe mitral annular calcification.   5. The inferior vena cava is normal in size with greater than 50%  respiratory variability, suggesting right atrial pressure of 3 mmHg.   6. The aortic valve is calcified. There is severe calcifcation of the  aortic valve. Aortic valve regurgitation is not visualized. Severe aortic  valve stenosis. Vmax 4.0 m/s, MG 35 mmHg, AVA 0.8 cm^2, DI 0.26   FINDINGS   Left Ventricle: Left ventricular ejection fraction, by estimation, is 65  to 70%. The left ventricle has normal function. The left ventricle has no  regional wall motion abnormalities. The left ventricular internal cavity  size was normal in size. There is   mild left ventricular hypertrophy. Left ventricular diastolic parameters  are indeterminate.   Right Ventricle: The right ventricular size is normal. No increase in  right ventricular wall thickness. Right ventricular systolic function is  mildly reduced. There is mildly elevated pulmonary artery systolic  pressure. The tricuspid regurgitant velocity   is 3.00 m/s, and with an assumed right atrial pressure of 3 mmHg, the  estimated right ventricular  systolic pressure is 49.8 mmHg.   Left Atrium: Left atrial size was mildly dilated.   Right Atrium: Right atrial size was normal in size.   Pericardium: There is no evidence of pericardial effusion.   Mitral Valve: The mitral valve is degenerative in appearance. Severe  mitral annular calcification. Trivial mitral valve regurgitation. Mild  mitral valve stenosis. The mean mitral valve gradient is 4.0 mmHg with  average heart rate of 80 bpm.   Tricuspid Valve: The tricuspid valve is normal in structure. Tricuspid  valve regurgitation is mild.   Aortic Valve: The aortic valve is calcified. There is severe calcifcation  of the aortic valve. Aortic valve regurgitation is not visualized. Severe  aortic stenosis is present. Aortic valve mean gradient measures 25.0 mmHg.  Aortic valve peak gradient  measures 48.9 mmHg. Aortic valve area, by VTI measures 0.94 cm.   Pulmonic Valve: The pulmonic valve was not well visualized. Pulmonic valve  regurgitation is not visualized.   Aorta: The aortic root and ascending aorta are structurally normal, with  no evidence of dilitation.   Venous: The inferior vena cava is normal in size with greater than 50%  respiratory variability, suggesting right atrial pressure of 3 mmHg.   IAS/Shunts: No atrial level shunt detected by color flow Doppler.     LEFT VENTRICLE  PLAX 2D  LVIDd:  3.90 cm  LVIDs:         2.00 cm  LV PW:         1.20 cm  LV IVS:        1.10 cm  LVOT diam:     1.90 cm  LV SV:         62  LV SV Index:   30  LVOT Area:     2.84 cm     RIGHT VENTRICLE  RV S prime:     9.80 cm/s  TAPSE (M-mode): 1.2 cm  RVSP:           44.0 mmHg   LEFT ATRIUM           Index        RIGHT ATRIUM  LA diam:      5.00 cm 2.43 cm/m   RA Pressure: 8.00 mmHg  LA Vol (A4C): 71.7 ml 34.85 ml/m   AORTIC VALVE  AV Area (Vmax):    0.81 cm  AV Area (Vmean):   0.91 cm  AV Area (VTI):     0.94 cm  AV Vmax:           349.80 cm/s  AV  Vmean:          220.000 cm/s  AV VTI:            0.664 m  AV Peak Grad:      48.9 mmHg  AV Mean Grad:      25.0 mmHg  LVOT Vmax:         99.93 cm/s  LVOT Vmean:        70.600 cm/s  LVOT VTI:          0.220 m  LVOT/AV VTI ratio: 0.33    AORTA  Ao Root diam: 3.00 cm  Ao Asc diam:  3.60 cm   MITRAL VALVE           TRICUSPID VALVE  MV Mean grad: 4.0 mmHg TR Peak grad:   36.0 mmHg                         TR Vmax:        300.00 cm/s                         Estimated RAP:  8.00 mmHg                         RVSP:           44.0 mmHg                           SHUNTS                         Systemic VTI:  0.22 m                         Systemic Diam: 1.90 cm  CATH (01/2022) Conclusion       Mid RCA lesion is 15% stenosed.   Ost 2nd Mrg to 2nd Mrg lesion is 50% stenosed.   2nd Mrg lesion is 90% stenosed.   Mid Cx lesion is 70% stenosed.   Prox RCA lesion is 90% stenosed.   Prox LAD to Mid LAD lesion is 50% stenosed.  A drug-eluting stent was successfully placed using a SYNERGY XD 3.0X24.   Post intervention, there is a 0% residual stenosis.   There is severe aortic valve stenosis.   1.  Severe aortic stenosis with mean transaortic gradient 42 mmHg, calculated aortic valve area 0.6 cm 2.  Two-vessel coronary artery disease with severe stenosis of the proximal RCA, treated with PCI using a 3.0 x 24 mm Synergy DES and severe stenosis of the OM branch of the circumflex with recommendations for medical therapy due to lesion complexity 3.  Moderate nonobstructive diffuse proximal to mid LAD stenosis estimated at 50%    Recent Radiology Findings:   CTA (03/2022) FINDINGS: Aortic Root:   Aortic valve: Trileaflet   Aortic valve calcium score: 2627   Aortic annulus:   Diameter: 60m x 260m  Perimeter: 7564m Area: 424 mm^2   Calcifications: Severe calcifications adjacent to left coronary cusp   Coronary height: Min Left - 31m60min Right - 11mm23minotubular height: Left  cusp - 25mm;70mht cusp - 31mm; 50moronary cusp - 22mm   57m (as measured 3 mm below the annulus):   Diameter: 24mm x 269m  Ar45m391 mm^2   Calcifications: Severe calcifications inferior to left coronary cusp   Aortic sinus width: Left cusp - 35mm; Righ79msp - 31mm; Nonco69mry cusp - 32mm   Sinot58mar junction width: 32mm x 29mm  65mimum97moroscopic Angle for Delivery: LAO 13 CRA 23   Cardiac:   Right atrium: Mild enlargement   Right ventricle: Normal size   Pulmonary arteries: Normal size   Pulmonary veins: Normal configuration   Left atrium: Moderate enlargement.  PFO   Left ventricle: Normal size   Pericardium: Normal thickness   Coronary arteries: S/p proximal RCA stent   Mitral valve: Severe mitral annular calcification   IMPRESSION: 1. Trileaflet aortic valve with severe calcifications (AV calcium score 2627)   2. Aortic annulus measures 28mm x 21mm in 60meter68mh perimeter 75mm and area 42485m2. Severe annular calcification adjacent to left coronary cusp extending into LVOT. Annular measurements are suitable for delivery of 23mm Edwards Sapie1mvalve   3. Low coronary height to RCA (11mm). Sufficient c19mary height to left main (31mm)   4.  Optimum 69mroscopic Angle for Delivery: LAO 13 CRA 23   5.  Severe mitral annular calcifications   6.  PFO   7.  S/p proximal RCA stent     Electronically Signed   By: Christopher  SchumannOswaldo Milian4 12:34    Addended by Schumann, ChristopherDonato Heinz7 PM    Study Result   Narrative & Impression  EXAM: OVER-READ INTERPRETATION  CT CHEST   The following report is a limited chest CT over-read performed by radiologist Dr. Jason Poff of [facility]/2024. This Mc[place]-read does not include interpretation of cardiac or coronary anatomy or pathology. The cardiac CTA interpretation by the cardiologist is attached.   COMPARISON:  04/28/2006 chest CT  angiogram. 05/08/2021 chest radiograph.   FINDINGS: Please see the separate concurrent chest CT angiogram report for details.   IMPRESSION: Please see the separate concurrent chest CT angiogram report for details.            Recent Lab Findings: Recent Labs       Lab Results  Component Value Date    WBC 6.4 03/11/2022    HGB 15.0 03/11/2022    HGB 15.3 03/11/2022  HCT 44.0 03/11/2022    HCT 45.0 03/11/2022    PLT 169 03/11/2022    GLUCOSE 81 02/24/2022    CHOL 166 05/16/2019    TRIG 279.0 (H) 05/16/2019    HDL 26.40 (L) 05/16/2019    LDLDIRECT 95.0 05/16/2019    LDLCALC 82 10/24/2015    ALT 24 09/17/2020    AST 26 09/17/2020    NA 141 03/11/2022    NA 139 03/11/2022    K 3.8 03/11/2022    K 4.0 03/11/2022    CL 100 02/24/2022    CREATININE 1.32 (H) 02/24/2022    BUN 13 02/24/2022    CO2 24 02/24/2022    TSH 2.33 05/16/2019    INR 2.4 03/27/2022    HGBA1C 6.8 (H) 05/16/2021            Assessment / Plan:     Pt with NYHA class 2 symptoms of severe AS with normal LV function and moderate CAD recently treated RCA stenosis with PCI with improved symptoms. Pt has chronic afib on coumadin. I feel pt has a class I indication for AVR and with his age, comorbidities would much be suited for TAVR. I feel after discussion with him and his family that he would not be surgical bail out for TAVR and he understands and agrees with plan. He would be treated with a 75m sapien valve via the left femoral approach. He does have significant annular calcification that extends down the mitral curtain and at higher risk for PVL. Pt should have coumadin held for 5 days and even though was transitioned for cath with lovenox, a discussion at conference for this to be done for TAVR should be discussed. Pt understands all the risks and goals of TAVR including access injury, need for PPM, stroke and death and he wishes to proceed.

## 2022-04-14 NOTE — Discharge Summary (Incomplete)
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)   GASTROESOPHAGEAL REFLUX DISEASE   OSA on CPAP   Chronic anticoagulation   CKD (chronic kidney disease), stage III (HCC)   Hypertension   Hyperlipidemia   CAD S/P percutaneous coronary angioplasty   Chronic atrial fibrillation (HCC)   Diabetic peripheral neuropathy (HCC)   Obstructive sleep apnea treated with bilevel positive airway pressure (BiPAP)   Severe aortic stenosis   Allergies Allergies  Allergen Reactions   Penicillins Shortness Of Breath and Swelling    Patient tolerated Rocephin injection 2019  PATIENT HAS HAD A PCN REACTION WITH IMMEDIATE RASH, FACIAL/TONGUE/THROAT SWELLING, SOB, OR LIGHTHEADEDNESS WITH HYPOTENSION:  #  #  YES  #  Has patient had a PCN reaction causing severe rash involving mucus membranes or skin necrosis: NO Has patient had a PCN reaction that required hospitalization NO Has patient had a PCN reaction occurring within the last 10 years: NO If all of the above answers are "NO", then may proceed with Cephalosporin use.    Atorvastatin Other (See Comments)    No energy, just felt bad     Pravastatin Other (See Comments)    myalgias   Atrovent Nasal Spray [Ipratropium] Other (See Comments)    Dizzy with use   Colchicine Other (See Comments)    Due to renal function- can use prednisone if needed    Hydrocodone Itching and Other (See Comments)    "swimmy headed" after use   Nsaids Other (See Comments)    Due to renal function   Uloric [Febuxostat]  Other (See Comments)    Would avoid, worsening gout sx with use.    Vancomycin Other (See Comments)    Red man syndrome- tolerated with slow run and benadryl use   Clarithromycin Diarrhea   Doxycycline Other (See Comments)    GI upset     Diagnostic Studies/Procedures    TAVR OPERATIVE NOTE     Date of Procedure:                04/14/2022   Preoperative Diagnosis:      Severe Aortic Stenosis    Postoperative Diagnosis:    Same    Procedure:        Transcatheter Aortic Valve Replacement - Percutaneous Left Transfemoral Approach             Edwards Sapien 3 Ultra THV (size 23 mm, model # 9755RSL, serial # [id number])              Co-Surgeons:                        Coralie Common MD and Sherren Mocha, MD      Anesthesiologist:                  Lahoma Crocker DO   Echocardiographer:              Tish Men MD   Pre-operative Echo Findings: Severe aortic stenosis Normal left ventricular systolic function   Post-operative  Echo Findings: no paravalvular leak normal left ventricular systolic function  _______________________    Echo 04/15/22: completed but pending formal read at the time of discharge   History of Present Illness     BANNER HUCKABA is a 89 y.o. male with a history of CAD s/p PCI/RCA 03/11/22, CKD stage IIIb, obesity (BMI 32), OSA on CPAP, HTN, HLD, permanent atrial fibrillation on Coumadin and severe aortic stenosis who presented to [facility] on 04/14/22 for planned TAVR.   He is followed by Dr. Burt Knack. He was seen in 08/2021 with stable symptoms of mild exertional dyspnea. At that time an echocardiogram suggested severe paradoxical low-flow low gradient aortic stenosis. In the setting of his advanced age and stable symptoms, we elected to have him return for 39-monthfollow-up with a repeat echocardiogram. His most recent echocardiogram 01/30/22 showed EF 65% and severe AS with mean grad 25 mmHg, peak grad 48.9 mmHg, AVA 0.94 cm2, DVI 0.33, SVI 30 as well as mild MS & TR.  The patient also reported significant progression in his symptoms with worsening shortness of breath, chest discomfort, and [facility]headedness[facility] two[facility] with severe stenosis of the proximal RCA, treated with PCI using a 3.0 x 24 mm Synergy DES and severe stenosis of the OM branch of the circumflex with recommendations for medical therapy due to lesion complexity. There was moderate nonobstructive diffuse proximal to mid LAD stenosis estimated at 50%. He was discharged on Plavix and Coumadin. No aspirin given elevated bleeding risk.   The patient has been evaluated by the multidisciplinary valve team and felt to have severe, symptomatic aortic stenosis and to be a suitable candidate for TAVR, which was set up for 04/14/22.   Hospital Course     Consultants: none   Severe AS: s/p successful TAVR with a 23 mm Edwards Sapien 3 Ultra Resilia THV via the TF approach on 04/14/22. Post operative echo completed but pending formal read. Groin sites are stable. He had a new LBBB immediately after TAVR but this has regressed to a narrow QRS this AM. Continued on Plavix and Coumadin given recent PCI. He walked with cardiac rehab with no issues. Plan for discharge home today with close follow up in the office next week.   [facility] two-vessel CAD with severe stenosis of the proximal RCA, treated with PCI using a 3.0 x 24 mm Synergy DES and severe stenosis of the OM branch of the circumflex with recommendations for medical therapy due to lesion complexity. There was moderate nonobstructive diffuse proximal to mid LAD stenosis estimated at 50%. Continued on Plavix and Coumadin given recent PCI (no aspirin given elevated bleeding risk).   Permanent atrial fibrillation: rate well controlled. Resume home Coumadin and AV nodal blocking agents. [facility].  CKD stage IIIb: creat stable ~ 1.29.   Pulmonary nodule: pre TAVR CT showed a "solid apical  right upper lobe 0.4 cm pulmonary nodule, new since 05/08/2006 chest CT. Although likely benign, if the patient is high-risk, given the morphology and/or location of this nodule a non-contrast chest CT can be considered in 12 months." This will be dicussed in the outpatient setting.  _____________  Discharge Vitals Blood pressure 130/65, pulse 90, temperature (!) 97.5 F (36.4 C), temperature source Oral, resp. rate 17, height '5\' 6"'$  (1.676 m), weight 98.4 kg, SpO2 96 %.  Filed Weights   04/14/22 0934 04/14/22 1840  Weight: 95.2 kg 98.4 kg  GEN: Well nourished, well developed, in no acute distress, obese HEENT: normal Neck: no JVD or masses Cardiac: RRR; no murmurs, rubs, or gallops,no edema  Respiratory:  clear to auscultation bilaterally, normal work of breathing GI: soft, nontender, nondistended, + BS MS: no deformity or atrophy Skin: warm and dry, no rash.  Groin sites clear without hematoma or ecchymosis Neuro:  Alert and Oriented x 3, Strength and sensation are intact Psych: euthymic mood, full affect   Labs & Radiologic Studies    CBC Recent Labs    04/14/22 1502 04/15/22 0240  WBC  --  7.7  HGB 15.0 15.4  HCT 44.0 44.6  MCV  --  86.3  PLT  --  347*   Basic Metabolic Panel Recent Labs    04/14/22 1502 04/15/22 0240  NA 139 135  K 4.3 4.2  CL 102 101  CO2  --  28  GLUCOSE 132* 110*  BUN 18 17  CREATININE 1.20 1.29*  CALCIUM  --  8.6*  MG  --  2.2   Liver Function Tests No results for input(s): "AST", "ALT", "ALKPHOS", "BILITOT", "PROT", "ALBUMIN" in the last 72 hours. No results for input(s): "LIPASE", "AMYLASE" in the last 72 hours. Cardiac Enzymes No results for input(s): "CKTOTAL", "CKMB", "CKMBINDEX", "TROPONINI" in the last 72 hours. BNP Invalid input(s): "POCBNP" D-Dimer No results for input(s): "DDIMER" in the last 72 hours. Hemoglobin A1C No results for input(s): "HGBA1C" in the last 72 hours. Fasting Lipid Panel No results for input(s):  "CHOL", "HDL", "LDLCALC", "TRIG", "CHOLHDL", "LDLDIRECT" in the last 72 hours. Thyroid Function Tests No results for input(s): "TSH", "T4TOTAL", "T3FREE", "THYROIDAB" in the last 72 hours.  Invalid input(s): "FREET3" _____________  ECHOCARDIOGRAM LIMITED  Result Date: 04/14/2022    ECHOCARDIOGRAM LIMITED REPORT   Patient Name:   ARTY LANTZY Date of Exam: 04/14/2022 Medical Rec #:  [id number]      Height:       66.0 in Accession #:    [id number]     Weight:       209.8 lb Date of Birth:  12/18/1930     BSA:          2.041 m Patient Age:    89 years       BP:           159/88 mmHg Patient Gender: M              HR:           87 bpm. Exam Location:  Inpatient Procedure: Limited Echo, Limited Color Doppler and Cardiac Doppler Indications:     Aortic Stenosis  History:         Patient has prior history of Echocardiogram examinations, most                  recent 01/30/2022. CAD, Aortic Valve Disease,                  Arrythmias:Bradycardia and Atrial Fibrillation; Risk                  Factors:Hypertension, Dyslipidemia and Diabetes.                  Aortic Valve: 23 mm Sapien prosthetic, stented (TAVR) valve is                  present in the aortic position. Procedure Date: 04/14/2022.  Sonographer:     Bernadene Person RDCS Referring Phys:  [place]: Eleonore Chiquito MD  Sonographer Comments: 91m Edwards Sapien aortic valve placed. IMPRESSIONS  1. Echo guided TAVR. 23 mm S3. Vmax 1.1 m/s, MG 3.0 mmHG, EOA 3.21 cm2, DI 0.95. No regurgitation or paravalvular leak. The aortic valve has been repaired/replaced. There is a 23 mm Sapien prosthetic (TAVR) valve present in the aortic position. Procedure Date: 04/14/2022. Echo findings are consistent with normal structure and function of the aortic valve prosthesis.  2. Left ventricular ejection fraction, by estimation, is 55 to 60%. The left ventricle has normal function.  3. Right ventricular systolic function is normal. The right ventricular  size is normal.  4. Left atrial size was severely dilated.  5. Right atrial size was severely dilated.  6. The mitral valve is degenerative. Trivial mitral valve regurgitation. Severe mitral annular calcification. FINDINGS  Left Ventricle: Left ventricular ejection fraction, by estimation, is 55 to 60%. The left ventricle has normal function. Right Ventricle: The right ventricular size is normal. No increase in right ventricular wall thickness. Right ventricular systolic function is normal. Left Atrium: Left atrial size was severely dilated. Right Atrium: Right atrial size was severely dilated. Mitral Valve: The mitral valve is degenerative in appearance. Severe mitral annular calcification. Trivial mitral valve regurgitation. Tricuspid Valve: The tricuspid valve is grossly normal. Tricuspid valve regurgitation is mild . No evidence of tricuspid stenosis. Aortic Valve: Echo guided TAVR. 23 mm S3. Vmax 1.1 m/s, MG 3.0 mmHG, EOA 3.21 cm2, DI 0.95. No regurgitation or paravalvular leak. The aortic valve has been repaired/replaced. Aortic valve mean gradient measures 3.0 mmHg. Aortic valve peak gradient measures 4.8 mmHg. Aortic valve area, by VTI measures 3.21 cm. There is a 23 mm Sapien prosthetic, stented (TAVR) valve present in the aortic position. Procedure Date: 04/14/2022. Echo findings are consistent with normal structure and function of the aortic valve prosthesis. Pulmonic Valve: The pulmonic valve was grossly normal. Pulmonic valve regurgitation is not visualized. IAS/Shunts: The interatrial septum was not well visualized. Additional Comments: Spectral Doppler performed. Color Doppler performed.  LEFT VENTRICLE PLAX 2D LVOT diam:     2.07 cm LV SV:         78 LV SV Index:   38 LVOT Area:     3.37 cm  AORTIC VALVE AV Area (Vmax):    2.99 cm AV Area (Vmean):   1.36 cm AV Area (VTI):     3.21 cm AV Vmax:           109.00 cm/s AV Vmean:          154.250 cm/s AV VTI:            0.243 m AV Peak Grad:      4.8  mmHg AV Mean Grad:      3.0 mmHg LVOT Vmax:         96.90 cm/s LVOT Vmean:        62.300 cm/s LVOT VTI:          0.232 m LVOT/AV VTI ratio: 0.95 TRICUSPID VALVE TR Peak grad:   44.9 mmHg TR Vmax:        335.00 cm/s  SHUNTS Systemic VTI:  0.23 m Systemic Diam: 2.07 cm WEleonore ChiquitoMD Electronically signed by WEleonore ChiquitoMD Signature Date/Time: 04/14/2022/4:51:23 PM    Final    Structural Heart Procedure  Result Date: 04/14/2022 See surgical note for result.  DG Chest 2 View  Result Date: 04/12/2022 CLINICAL DATA:  Preoperative evaluation for upcoming aortic valve replacement  EXAM: CHEST - 2 VIEW COMPARISON:  03/17/2022 FINDINGS: The cardiac shadow is within normal limits. Lungs are well aerated bilaterally. Multiple calcified granulomas are again seen and stable. Two ovoid densities are noted over the left base new from the prior exam although shown to be extrinsic to the chest cavity likely within the patient's clothing. No bony abnormality is noted. IMPRESSION: Changes consistent with prior granulomatous disease. No other focal abnormality is noted. Electronically Signed   By: Inez Catalina M.D.   On: 04/12/2022 20:56   VAS [place] LOWER EXTREMITY VENOUS (DVT)  Result Date: 03/30/2022  Lower Venous DVT Study Patient Name:  OLUWASEUN CREMER  Date of Exam:   03/30/2022 Medical Rec #: [id number]       Accession #:    [id number] Date of Birth: Oct 29, 1930      Patient Gender: M Patient Age:   27 years Exam Location:  [facility] Procedure:      VAS [place] LOWER EXTREMITY VENOUS (DVT) Referring Phys: DANIEL CAFFREY --------------------------------------------------------------------------------  Indications: Patient reports acute onset of swelling and discoloration to the left posterior calf for the past week and a half. He does have chronic SOB.  Risk Factors: None identified. Anticoagulation: Coumadin. Comparison Study: NA Performing Technologist: Sharlett Iles RVT  Examination Guidelines: A complete evaluation includes  B-mode imaging, spectral Doppler, color Doppler, and power Doppler as needed of all accessible portions of each vessel. Bilateral testing is considered an integral part of a complete examination. Limited examinations for reoccurring indications may be performed as noted. The reflux portion of the exam is performed with the patient in reverse Trendelenburg.  +-----+---------------+---------+-----------+----------+--------------+ RIGHTCompressibilityPhasicitySpontaneityPropertiesThrombus Aging +-----+---------------+---------+-----------+----------+--------------+ CFV  Full           Yes      Yes                                 +-----+---------------+---------+-----------+----------+--------------+   +---------+---------------+---------+-----------+----------+--------------+ LEFT     CompressibilityPhasicitySpontaneityPropertiesThrombus Aging +---------+---------------+---------+-----------+----------+--------------+ CFV      Full           Yes      Yes                                 +---------+---------------+---------+-----------+----------+--------------+ SFJ      Full           Yes      Yes                                 +---------+---------------+---------+-----------+----------+--------------+ FV Prox  Full           Yes      Yes                                 +---------+---------------+---------+-----------+----------+--------------+ FV Mid   Full                                                        +---------+---------------+---------+-----------+----------+--------------+ FV DistalFull           Yes      Yes                                 +---------+---------------+---------+-----------+----------+--------------+  PFV      Full                    Yes                                 +---------+---------------+---------+-----------+----------+--------------+ POP      Full           Yes      Yes                                  +---------+---------------+---------+-----------+----------+--------------+ PTV      Full                                                        +---------+---------------+---------+-----------+----------+--------------+ PERO     Full                                                        +---------+---------------+---------+-----------+----------+--------------+ Gastroc  Full                                                        +---------+---------------+---------+-----------+----------+--------------+ GSV      Full           Yes      Yes                                 +---------+---------------+---------+-----------+----------+--------------+ SSV      Full                    No                                  +---------+---------------+---------+-----------+----------+--------------+ TPT      Full           Yes      Yes                                 +---------+---------------+---------+-----------+----------+--------------+   Left Technical Findings: Rouleau flow noted in the proximal thigh GSV. Limited visualization of the PTVs and peroneal veins due to increase lower leg swelling. Specifically, the popliteal vein and tibioperoneal confluence are without thrombus.  Summary: RIGHT: - No evidence of common femoral vein obstruction.  LEFT: - No evidence of deep vein thrombosis in the lower extremity. No indirect evidence of obstruction proximal to the inguinal ligament. - No cystic structure found in the popliteal fossa.  *See table(s) above for measurements and observations. Electronically signed by Jenkins Rouge MD on 03/30/2022 at 2:42:20 PM.    Final    CT CORONARY MORPH W/CTA COR W/SCORE W/CA W/CM &/OR WO/CM  Addendum Date: 03/17/2022   ADDENDUM  REPORT: 03/17/2022 12:34 CLINICAL DATA:  61M with severe aortic stenosis being evaluated for a TAVR procedure. EXAM: Cardiac TAVR CT TECHNIQUE: The patient was scanned on a [employer]. A 120 kV retrospective  scan was triggered in the descending thoracic aorta at 111 HU's. Gantry rotation speed was 250 msecs and collimation was .6 mm. No beta blockade or nitro were given. The 3D data set was reconstructed in 5% intervals of the R-R cycle. Systolic and diastolic phases were analyzed on a dedicated work station using MPR, MIP and VRT modes. The patient received 100 cc of contrast. FINDINGS: Aortic Root: Aortic valve: Trileaflet Aortic valve calcium score: 2627 Aortic annulus: Diameter: 62m x 251mPerimeter: 7532mrea: 424 mm^2 Calcifications: Severe calcifications adjacent to left coronary cusp Coronary height: Min Left - 70m52min Right - 11mm53motubular height: Left cusp - 25mm;67mht cusp - 70mm; 19moronary cusp - 22mm LV39mas measured 3 mm below the annulus): Diameter: 24mm x 220mArea55m1 mm^2 Calcifications: Severe calcifications inferior to left coronary cusp Aortic sinus width: Left cusp - 35mm; Righ21msp - 31mm; Nonco29mry cusp - 32mm Sinotub24m junction width: 32mm x 29mm O66mum F15moscopic Angle for Delivery: LAO 13 CRA 23 Cardiac: Right atrium: Mild enlargement Right ventricle: Normal size Pulmonary arteries: Normal size Pulmonary veins: Normal configuration Left atrium: Moderate enlargement.  PFO Left ventricle: Normal size Pericardium: Normal thickness Coronary arteries: S/p proximal RCA stent Mitral valve: Severe mitral annular calcification IMPRESSION: 1. Trileaflet aortic valve with severe calcifications (AV calcium score 2627) 2. Aortic annulus measures 28mm x 21mm in 55meter1mh perimeter 75mm and area 42476m2. Severe annular calcification adjacent to left coronary cusp extending into LVOT. Annular measurements are suitable for delivery of 23mm Edwards Sapie17mvalve 3. Low coronary height to RCA (11mm). Sufficient c2mary height to left main (70mm) 4.  Optimum Fl77mscopic Angle for Delivery: LAO 13 CRA 23 5.  Severe mitral annular calcifications 6.  PFO 7.  S/p proximal RCA stent  Electronically Signed   By: Christopher  SchumannOswaldo Milian4 12:34   Result Date: 03/17/2022 EXAM: OVER-READ INTERPRETATION  CT CHEST The following report is a limited chest CT over-read performed by radiologist Dr. Jason Poff of [place]. This [place]read does not include interpretation of cardiac or coronary anatomy or pathology. The cardiac CTA interpretation by the cardiologist is attached. COMPARISON:  04/28/2006 chest CT angiogram. 05/08/2021 chest radiograph. FINDINGS: Please see the separate concurrent chest CT angiogram report for details. IMPRESSION: Please see the separate concurrent chest CT angiogram report for details. Electronically Signed: By: Jason A Poff M.D. On:Ilona Sorrel11:22   CT ANGIO ABDOMEN PELVIS  W &/OR WO CONTRAST  Result Date: 03/17/2022 CLINICAL DATA:  Aortic valve replacement (TAVR), pre-op eval. Severe aortic valve stenosis. EXAM: CT ANGIOGRAPHY CHEST, ABDOMEN AND PELVIS TECHNIQUE: Multidetector CT imaging through the chest, abdomen and pelvis was performed using the standard protocol during bolus administration of intravenous contrast. Multiplanar reconstructed images and MIPs were obtained and reviewed to evaluate the vascular anatomy. RADIATION DOSE REDUCTION: This exam was performed according to the departmental dose-optimization program which includes automated exposure control, adjustment of the mA and/or kV according to patient size and/or use of iterative reconstruction technique. CONTRAST:  100mL OMNIPAQUE IOHEXO35m0 MG/ML SOLN COMPARISON:  04/28/2006 chest CT angiogram. 05/08/2021 chest radiograph. 09/14/2017 CT abdomen/pelvis. FINDINGS: CTA CHEST FINDINGS Cardiovascular: Mild cardiomegaly. No significant pericardial effusion/thickening. Diffuse thickening and coarse calcification of the aortic valve. Three-vessel  coronary atherosclerosis. Atherosclerotic nonaneurysmal thoracic aorta. Normal caliber pulmonary arteries. No central  pulmonary emboli. Mediastinum/Nodes: No significant thyroid nodules. Unremarkable esophagus. No axillary adenopathy. Coarsely calcified nonenlarged paratracheal, AP window, subcarinal and bilateral hilar lymph nodes, compatible with prior granulomatous disease. No pathologically enlarged non calcified mediastinal or hilar nodes. Lungs/Pleura: No pneumothorax. No pleural effusion. Solid apical right upper lobe 0.4 cm pulmonary nodule (series 7/image 17), new. Separate apical right upper lobe solid 0.2 cm pulmonary nodule (series 7/image 20), stable, considered benign. Innumerable subcentimeter coarse calcified bilateral pulmonary granulomas are unchanged. No acute consolidative airspace disease, lung masses or additional new significant pulmonary nodules. Musculoskeletal: No aggressive appearing focal osseous lesions. Marked thoracic spondylosis. CTA ABDOMEN AND PELVIS FINDINGS Hepatobiliary: Liver surface appears finely irregular, cannot exclude cirrhosis. Punctate granulomatous calcifications throughout the liver. No liver masses. Normal gallbladder with no radiopaque cholelithiasis. No biliary ductal dilatation. Pancreas: Normal, with no mass or duct dilation. Spleen: Normal size. No mass. Adrenals/Urinary Tract: Normal adrenals. No hydronephrosis. Simple 1.3 cm anterior upper right renal cyst, for which no follow-up imaging is recommended. No suspicious contour deforming renal masses. Normal bladder. Stomach/Bowel: Normal non-distended stomach. Normal caliber small bowel with no small bowel wall thickening. Normal appendix. Marked diffuse colonic diverticulosis with no large bowel wall thickening or significant pericolonic fat stranding. Vascular/Lymphatic: Atherosclerotic nonaneurysmal abdominal aorta. No pathologically enlarged lymph nodes in the abdomen or pelvis. Reproductive: Normal size prostate. Other: No pneumoperitoneum, ascites or focal fluid collection. Musculoskeletal: No aggressive appearing focal  osseous lesions. Marked multilevel lumbar degenerative disc disease. VASCULAR MEASUREMENTS PERTINENT TO TAVR: AORTA: Minimal Aortic Diameter-13.5 x 11.5 mm Severity of Aortic Calcification-moderate to severe RIGHT PELVIS: Right Common Iliac Artery - Minimal Diameter-6.4 x 5.5 mm Tortuosity-mild Calcification-severe Right External Iliac Artery - Minimal Diameter-8.0 x 7.9 mm Tortuosity-mild-to-moderate Calcification-mild Right Common Femoral Artery - Minimal Diameter-8.3 x 3.3 mm Tortuosity-mild Calcification-severe LEFT PELVIS: Left Common Iliac Artery - Minimal Diameter-9.2 x 7.8 mm Tortuosity-mild Calcification-moderate to severe Left External Iliac Artery - Minimal Diameter-9.2 x 8.6 mm Tortuosity-moderate Calcification-mild Left Common Femoral Artery - Minimal Diameter-7.5 x 6.6 mm Tortuosity-mild Calcification-severe Review of the MIP images confirms the above findings. IMPRESSION: 1. Vascular findings and measurements pertinent to potential TAVR procedure, as detailed. 2. Diffuse thickening and coarse calcification of the aortic valve, compatible with the reported history of severe aortic stenosis. 3. Mild cardiomegaly. Three-vessel coronary atherosclerosis. 4. Liver surface appears finely irregular, cannot exclude cirrhosis. No liver masses. 5. Marked diffuse colonic diverticulosis. 6. Solid apical right upper lobe 0.4 cm pulmonary nodule, new since 05/08/2006 chest CT. Although likely benign, if the patient is high-risk, given the morphology and/or location of this nodule a non-contrast chest CT can be considered in 12 months.This recommendation follows the consensus statement: Guidelines for Management of Incidental Pulmonary Nodules Detected on CT Images: From the Fleischner Society 2017; Radiology 2017; 284:228-243. 7.  Aortic Atherosclerosis (ICD10-I70.0). Electronically Signed   By: Ilona Sorrel M.D.   On: 03/17/2022 12:11   CT ANGIO CHEST AORTA W/CM & OR WO/CM  Result Date: 03/17/2022 CLINICAL DATA:   Aortic valve replacement (TAVR), pre-op eval. Severe aortic valve stenosis. EXAM: CT ANGIOGRAPHY CHEST, ABDOMEN AND PELVIS TECHNIQUE: Multidetector CT imaging through the chest, abdomen and pelvis was performed using the standard protocol during bolus administration of intravenous contrast. Multiplanar reconstructed images and MIPs were obtained and reviewed to evaluate the vascular anatomy. RADIATION DOSE REDUCTION: This exam was performed according to the departmental dose-optimization program which includes automated exposure control, adjustment of  the mA and/or kV according to patient size and/or use of iterative reconstruction technique. CONTRAST:  187m OMNIPAQUE IOHEXOL 350 MG/ML SOLN COMPARISON:  04/28/2006 chest CT angiogram. 05/08/2021 chest radiograph. 09/14/2017 CT abdomen/pelvis. FINDINGS: CTA CHEST FINDINGS Cardiovascular: Mild cardiomegaly. No significant pericardial effusion/thickening. Diffuse thickening and coarse calcification of the aortic valve. Three-vessel coronary atherosclerosis. Atherosclerotic nonaneurysmal thoracic aorta. Normal caliber pulmonary arteries. No central pulmonary emboli. Mediastinum/Nodes: No significant thyroid nodules. Unremarkable esophagus. No axillary adenopathy. Coarsely calcified nonenlarged paratracheal, AP window, subcarinal and bilateral hilar lymph nodes, compatible with prior granulomatous disease. No pathologically enlarged non calcified mediastinal or hilar nodes. Lungs/Pleura: No pneumothorax. No pleural effusion. Solid apical right upper lobe 0.4 cm pulmonary nodule (series 7/image 17), new. Separate apical right upper lobe solid 0.2 cm pulmonary nodule (series 7/image 20), stable, considered benign. Innumerable subcentimeter coarse calcified bilateral pulmonary granulomas are unchanged. No acute consolidative airspace disease, lung masses or additional new significant pulmonary nodules. Musculoskeletal: No aggressive appearing focal osseous lesions. Marked  thoracic spondylosis. CTA ABDOMEN AND PELVIS FINDINGS Hepatobiliary: Liver surface appears finely irregular, cannot exclude cirrhosis. Punctate granulomatous calcifications throughout the liver. No liver masses. Normal gallbladder with no radiopaque cholelithiasis. No biliary ductal dilatation. Pancreas: Normal, with no mass or duct dilation. Spleen: Normal size. No mass. Adrenals/Urinary Tract: Normal adrenals. No hydronephrosis. Simple 1.3 cm anterior upper right renal cyst, for which no follow-up imaging is recommended. No suspicious contour deforming renal masses. Normal bladder. Stomach/Bowel: Normal non-distended stomach. Normal caliber small bowel with no small bowel wall thickening. Normal appendix. Marked diffuse colonic diverticulosis with no large bowel wall thickening or significant pericolonic fat stranding. Vascular/Lymphatic: Atherosclerotic nonaneurysmal abdominal aorta. No pathologically enlarged lymph nodes in the abdomen or pelvis. Reproductive: Normal size prostate. Other: No pneumoperitoneum, ascites or focal fluid collection. Musculoskeletal: No aggressive appearing focal osseous lesions. Marked multilevel lumbar degenerative disc disease. VASCULAR MEASUREMENTS PERTINENT TO TAVR: AORTA: Minimal Aortic Diameter-13.5 x 11.5 mm Severity of Aortic Calcification-moderate to severe RIGHT PELVIS: Right Common Iliac Artery - Minimal Diameter-6.4 x 5.5 mm Tortuosity-mild Calcification-severe Right External Iliac Artery - Minimal Diameter-8.0 x 7.9 mm Tortuosity-mild-to-moderate Calcification-mild Right Common Femoral Artery - Minimal Diameter-8.3 x 3.3 mm Tortuosity-mild Calcification-severe LEFT PELVIS: Left Common Iliac Artery - Minimal Diameter-9.2 x 7.8 mm Tortuosity-mild Calcification-moderate to severe Left External Iliac Artery - Minimal Diameter-9.2 x 8.6 mm Tortuosity-moderate Calcification-mild Left Common Femoral Artery - Minimal Diameter-7.5 x 6.6 mm Tortuosity-mild Calcification-severe  Review of the MIP images confirms the above findings. IMPRESSION: 1. Vascular findings and measurements pertinent to potential TAVR procedure, as detailed. 2. Diffuse thickening and coarse calcification of the aortic valve, compatible with the reported history of severe aortic stenosis. 3. Mild cardiomegaly. Three-vessel coronary atherosclerosis. 4. Liver surface appears finely irregular, cannot exclude cirrhosis. No liver masses. 5. Marked diffuse colonic diverticulosis. 6. Solid apical right upper lobe 0.4 cm pulmonary nodule, new since 05/08/2006 chest CT. Although likely benign, if the patient is high-risk, given the morphology and/or location of this nodule a non-contrast chest CT can be considered in 12 months.This recommendation follows the consensus statement: Guidelines for Management of Incidental Pulmonary Nodules Detected on CT Images: From the Fleischner Society 2017; Radiology 2017; 284:228-243. 7.  Aortic Atherosclerosis (ICD10-I70.0). Electronically Signed   By: JIlona SorrelM.D.   On: 03/17/2022 12:11   Disposition   Pt is being discharged home today in good condition.  Follow-up Plans & Appointments     Follow-up Information     MTommie Raymond NP. Go on 04/24/2022.  Specialty: Cardiology Why: @ 9am. please arrive at least 10 minutes early. Contact information: [address] [place] [address] [phone]                  Discharge Medications   Allergies as of 04/15/2022       Reactions   Penicillins Shortness Of Breath, Swelling   Patient tolerated Rocephin injection 2019 PATIENT HAS HAD A PCN REACTION WITH IMMEDIATE RASH, FACIAL/TONGUE/THROAT SWELLING, SOB, OR LIGHTHEADEDNESS WITH HYPOTENSION:  #  #  YES  #  Has patient had a PCN reaction causing severe rash involving mucus membranes or skin necrosis: NO Has patient had a PCN reaction that required hospitalization NO Has patient had a PCN reaction occurring within the last 10 years: NO If all  of the above answers are "NO", then may proceed with Cephalosporin use.   Atorvastatin Other (See Comments)   No energy, just felt bad   Pravastatin Other (See Comments)   myalgias   Atrovent Nasal Spray [ipratropium] Other (See Comments)   Dizzy with use   Colchicine Other (See Comments)   Due to renal function- can use prednisone if needed    Hydrocodone Itching, Other (See Comments)   "swimmy headed" after use   Nsaids Other (See Comments)   Due to renal function   Uloric [febuxostat] Other (See Comments)   Would avoid, worsening gout sx with use.    Vancomycin Other (See Comments)   Red man syndrome- tolerated with slow run and benadryl use   Clarithromycin Diarrhea   Doxycycline Other (See Comments)   GI upset        Medication List     TAKE these medications    acetaminophen 500 MG tablet Commonly known as: TYLENOL Take 500 mg by mouth as needed for mild pain.   allopurinol 100 MG tablet Commonly known as: ZYLOPRIM TAKE 1 TABLET BY MOUTH EVERY DAY   clopidogrel 75 MG tablet Commonly known as: Plavix Take 1 tablet (75 mg total) by mouth daily.   cyanocobalamin 1000 MCG/ML injection Commonly known as: VITAMIN B12 1031mg IM every 28 days What changed:  how much to take how to take this when to take this additional instructions   diltiazem 180 MG 24 hr capsule Commonly known as: CARDIZEM CD Take 1 capsule (180 mg total) by mouth daily.   furosemide 20 MG tablet Commonly known as: LASIX Take 1 tablet (20 mg total) by mouth daily.   lisinopril 20 MG tablet Commonly known as: ZESTRIL Take 10 mg by mouth in the morning and at bedtime.   metoprolol tartrate 25 MG tablet Commonly known as: LOPRESSOR Take 0.5 tablets (12.5 mg total) by mouth 2 (two) times daily.   pantoprazole 40 MG tablet Commonly known as: Protonix Take 1 tablet (40 mg total) by mouth daily.   tamsulosin 0.4 MG Caps capsule Commonly known as: FLOMAX TAKE 1 CAPSULE BY MOUTH EVERY  DAY   warfarin 4 MG tablet Commonly known as: COUMADIN Take as directed. If you are unsure how to take this medication, talk to your nurse or doctor. Original instructions: TAKE 1 TO 1 AND 1/2 TABLETS BY MOUTH DAILY OR AS DIRECTED BY COUMADIN CLINIC What changed: See the new instructions.          Outstanding Labs/Studies   INR  Duration of Discharge Encounter   Greater than 30 minutes including physician time.  SMable Fill PA-C 04/15/2022, 10:04 AM [phone] Patient seen, examined. Available  data reviewed. Agree with findings, assessment, and plan as outlined by Nell Range, PA-C. The patient is independently interviewed and examined. His daughter is present at the bedside. He is alert, oriented, in NAD. Lungs CTA, heart irregular without murmur, abdomen soft, NT, BL groin sites clear, no pretibial edema. Tele shows atrial fibrillation with controlled ventricular rate. Echo reviewed and shows normal transcatheter aortic valve function with a mean gradient of 10 mmhg and no PVL. Pt is medically stable for DC. I agree with the medications outlined above. Follow-up and post-TAVR restrictions reviewed with the patient and his daughter. Otherwise as outlined above.   Sherren Mocha, M.D. 04/15/2022 7:18 PM

## 2022-04-14 NOTE — Op Note (Signed)
[facility]   MULTIDISCIPLINARY HEART VALVE TEAM   TAVR OPERATIVE NOTE   Date of Procedure:  04/14/2022  Preoperative Diagnosis: Severe Aortic Stenosis   Postoperative Diagnosis: Same   Procedure:   Transcatheter Aortic Valve Replacement - Percutaneous Left Transfemoral Approach  Edwards Sapien 3 Ultra THV (size 23 mm, model # 9755RSL, serial # [id number])   Co-Surgeons:  Coralie Common MD and Sherren Mocha, MD    Anesthesiologist:  Lahoma Crocker DO  Echocardiographer:  Tish Men MD  Pre-operative Echo Findings: Severe aortic stenosis Normal left ventricular systolic function  Post-operative Echo Findings: no paravalvular leak normal left ventricular systolic function   BRIEF CLINICAL NOTE AND INDICATIONS FOR SURGERY   Pt with NYHA class 2 symptoms of severe AS with normal LV function and moderate CAD recently treated RCA stenosis with PCI with improved symptoms. Pt has chronic afib on coumadin. I feel pt has a class I indication for AVR and with his age, comorbidities would much be suited for TAVR. I feel after discussion with him and his family that he would not be surgical bail out for TAVR and he understands and agrees with plan. He would be treated with a 36m sapien valve via the left femoral approach     DETAILS OF THE OPERATIVE PROCEDURE  PREPARATION:    The patient was brought to the operating room on the above mentioned date and appropriate monitoring was established by the anesthesia team. The patient was placed in the supine position on the operating table.  Intravenous antibiotics were administered. The patient was monitored closely throughout the procedure under conscious sedation.    Baseline transthoracic echocardiogram was performed. The patient's abdomen and both groins were prepped and draped in a sterile manner. A time out procedure was performed.   PERIPHERAL ACCESS:    Using the modified Seldinger technique, femoral arterial and  venous access was obtained with placement of 6 Fr sheaths on the right side.  A pigtail diagnostic catheter was passed through the right arterial sheath under fluoroscopic guidance into the aortic root.  A temporary transvenous pacemaker catheter was passed through the right femoral venous sheath under fluoroscopic guidance into the right ventricle.  The pacemaker was tested to ensure stable lead placement and pacemaker capture. Aortic root angiography was performed in order to determine the optimal angiographic angle for valve deployment.   TRANSFEMORAL ACCESS:   Percutaneous transfemoral access and sheath placement was performed using ultrasound guidance.  The left common femoral artery was cannulated using a micropuncture needle and appropriate location was verified using hand injection angiogram.  A pair of Abbott Perclose percutaneous closure devices were placed and a 6 French sheath replaced into the femoral artery.  The patient was heparinized systemically and ACT verified > 250 seconds.    A 14 Fr transfemoral E-sheath was introduced into the left common femoral artery after progressively dilating over an Amplatz superstiff wire. An AL2 catheter was used to direct a straight-tip exchange length wire across the native aortic valve into the left ventricle. This was exchanged out for a pigtail catheter and position was confirmed in the LV apex. Simultaneous LV and Ao pressures were recorded. LVEDP measured 225mg The pigtail catheter was exchanged for a Safari wire in the LV apex.   BALLOON AORTIC VALVULOPLASTY:   Was not done  TRANSCATHETER HEART VALVE DEPLOYMENT:   An Edwards Sapien 3 Ultra transcatheter heart valve (size 23 mm) was prepared and crimped per manufacturer's guidelines, and the proper orientation of  the valve is confirmed on the [employer] delivery system. The valve was advanced through the introducer sheath using normal technique until in an appropriate position in the  abdominal aorta beyond the sheath tip. The balloon was then retracted and using the fine-tuning wheel was centered on the valve. The valve was then advanced across the aortic arch using appropriate flexion of the catheter. The valve was carefully positioned across the aortic valve annulus. The Commander catheter was retracted using normal technique. Once final position of the valve has been confirmed by angiographic assessment, the valve is deployed during rapid ventricular pacing to maintain systolic blood pressure < 50 mmHg and pulse pressure < 10 mmHg. The balloon inflation is held for >3 seconds after reaching full deployment volume. Once the balloon has fully deflated the balloon is retracted into the ascending aorta and valve function is assessed using echocardiography. There is felt to be no paravalvular leak and no central aortic insufficiency.  The patient's hemodynamic recovery following valve deployment is good.  The deployment balloon and guidewire are both removed.    PROCEDURE COMPLETION:   The sheath was removed and femoral artery closure performed.  Protamine was administered once femoral arterial repair was complete. The temporary pacemaker, pigtail catheter and femoral sheaths were removed with manual pressure used for venous hemostasis. Manual pressure was utilized following removal of the diagnostic sheath in the right femoral artery.  The patient tolerated the procedure well and is transported to the cath lab recovery area in stable condition. There were no immediate intraoperative complications. All sponge instrument and needle counts are verified correct at completion of the operation.   No blood products were administered during the operation.  The patient received a total of 40 mL of intravenous contrast during the procedure.   Coralie Common, MD 04/14/2022 2:34 PM

## 2022-04-14 NOTE — Op Note (Signed)
[facility]   MULTIDISCIPLINARY HEART VALVE TEAM   TAVR OPERATIVE NOTE   Date of Procedure:  04/14/2022  Preoperative Diagnosis: Severe Aortic Stenosis   Postoperative Diagnosis: Same   Procedure:   Transcatheter Aortic Valve Replacement - Percutaneous  Transfemoral Approach  Edwards Sapien 3 Ultra Resilia THV (size 23 mm, serial # [id number])   Co-Surgeons:  Coralie Common, MD and Sherren Mocha, MD  Anesthesiologist:  Jana Half, DO  Echocardiographer:  Marry Guan, MD  Pre-operative Echo Findings: Severe aortic stenosis Normal left ventricular systolic function  Post-operative Echo Findings: No paravalvular leak Normal/unchanged left ventricular systolic function Mean Ao gradient 3 mmHg  BRIEF CLINICAL NOTE AND INDICATIONS FOR SURGERY  89 yo male who I have followed for many years with persistent atrial fibrillation, CAD, and obesity, presenting today for TAVR for treatment of severe, symptomatic aortic stenosis. He has developed progressive, now severe symptomatic aortic stenosis with NYHA functional class limitation due to exertional dyspnea. His preop cath showed severe RCA stenosis and he was treated with PCI. Med Rx was recommended for his residual CAD.   During the course of the patient's preoperative work up they have been evaluated comprehensively by a multidisciplinary team of specialists coordinated through the [facility] in the [facility].  They have been demonstrated to suffer from symptomatic severe aortic stenosis as noted above. The patient has been counseled extensively as to the relative risks and benefits of all options for the treatment of severe aortic stenosis including long term medical therapy, conventional surgery for aortic valve replacement, and transcatheter aortic valve replacement.  The patient has been independently evaluated in formal cardiac surgical consultation by Dr Lavonna Monarch,  who deemed the patient appropriate for TAVR. Based upon review of all of the patient's preoperative diagnostic tests they are felt to be candidate for transcatheter aortic valve replacement using the transfemoral approach as an alternative to conventional surgery.    Following the decision to proceed with transcatheter aortic valve replacement, a discussion has been held regarding what types of management strategies would be attempted intraoperatively in the event of life-threatening complications, including whether or not the patient would be considered a candidate for the use of cardiopulmonary bypass and/or conversion to open sternotomy for attempted surgical intervention.  The patient has been advised of a variety of complications that might develop peculiar to this approach including but not limited to risks of death, stroke, paravalvular leak, aortic dissection or other major vascular complications, aortic annulus rupture, device embolization, cardiac rupture or perforation, acute myocardial infarction, arrhythmia, heart block or bradycardia requiring permanent pacemaker placement, congestive heart failure, respiratory failure, renal failure, pneumonia, infection, other late complications related to structural valve deterioration or migration, or other complications that might ultimately cause a temporary or permanent loss of functional independence or other long term morbidity.  The patient provides full informed consent for the procedure as described and all questions were answered preoperatively.  DETAILS OF THE OPERATIVE PROCEDURE  PREPARATION:   The patient is brought to the operating room on the above mentioned date and central monitoring was established by the anesthesia team including placement of a radial arterial line. The patient is placed in the supine position on the operating table.  Intravenous antibiotics are administered. The patient is monitored closely throughout the procedure under  conscious sedation.  Baseline transthoracic echocardiogram is performed. The patient's chest, abdomen, both groins, and both lower extremities are prepared and draped in a sterile manner.  A time out procedure is performed.   PERIPHERAL ACCESS:   Using ultrasound guidance, femoral arterial and venous access is obtained with placement of 6 Fr sheaths on the right side.  [place] images are digitally captured and stored in the patient's chart. A pigtail diagnostic catheter was passed through the femoral arterial sheath under fluoroscopic guidance into the aortic root.  A temporary transvenous pacemaker catheter was passed through the femoral venous sheath under fluoroscopic guidance into the right ventricle.  The pacemaker was tested to ensure stable lead placement and pacemaker capture. Aortic root angiography was performed in order to determine the optimal angiographic angle for valve deployment.  TRANSFEMORAL ACCESS:  A micropuncture technique is used to access the left femoral artery under fluoroscopic and ultrasound guidance.  2 Perclose devices are deployed at 10' and 2' positions to 'PreClose' the femoral artery. An 8 French sheath is placed and then an Amplatz Superstiff wire is advanced through the sheath. This is changed out for a 14 French transfemoral E-Sheath after progressively dilating over the Superstiff wire.  An AL-2 catheter was used to direct a straight-tip exchange length wire across the native aortic valve into the left ventricle. This was exchanged out for a pigtail catheter and position was confirmed in the LV apex. Simultaneous LV and Ao pressures were recorded.  The pigtail catheter was exchanged for a Safari wire in the LV apex.    BALLOON AORTIC VALVULOPLASTY:  Not performed  TRANSCATHETER HEART VALVE DEPLOYMENT:  An Edwards Sapien 3 transcatheter heart valve (size 23 mm) was prepared and crimped per manufacturer's guidelines, and the proper orientation of the valve is confirmed on  the [employer] delivery system. The valve was advanced through the introducer sheath using normal technique until in an appropriate position in the abdominal aorta beyond the sheath tip. The balloon was then retracted and using the fine-tuning wheel was centered on the valve. The valve was then advanced across the aortic arch using appropriate flexion of the catheter. The valve was carefully positioned across the aortic valve annulus. The Commander catheter was retracted using normal technique. Once final position of the valve has been confirmed by angiographic assessment, the valve is deployed while temporarily holding ventilation and during rapid ventricular pacing to maintain systolic blood pressure < 50 mmHg and pulse pressure < 10 mmHg. The balloon inflation is held for >3 seconds after reaching full deployment volume. Once the balloon has fully deflated the balloon is retracted into the ascending aorta and valve function is assessed using echocardiography. The patient's hemodynamic recovery following valve deployment is good.  The deployment balloon and guidewire are both removed. Echo demostrated acceptable post-procedural gradients, stable mitral valve function, and no aortic insufficiency.    PROCEDURE COMPLETION:  The sheath was removed and femoral artery closure is performed using the 2 previously deployed Perclose devices.  Protamine is administered once femoral arterial repair was complete. The site is clear with no evidence of bleeding or hematoma after the sutures are tightened. The temporary pacemaker and pigtail catheters are removed. Manual pressure is used for contralateral femoral arterial hemostasis for the 6 Fr sheath.  The patient tolerated the procedure well and is transported to the recovery area in stable condition. There were no immediate intraoperative complications. All sponge instrument and needle counts are verified correct at completion of the operation.   The patient  received a total of 40 mL of intravenous contrast during the procedure.  Final Conclusion: successful transfemoral TAVR with a 23 mm Sapien  3 Ultra Resilia THV, LVEDP 22 mmHg, post-procedure mean gradient 3 mmHg with no PVL.  Sherren Mocha, MD 04/14/2022 3:13 PM

## 2022-04-14 NOTE — Interval H&P Note (Signed)
History and Physical Interval Note:  04/14/2022 9:40 AM  [name]  has presented today for surgery, with the diagnosis of Severe Aortic Stenosis.  The various methods of treatment have been discussed with the patient and family. After consideration of risks, benefits and other options for treatment, the patient has consented to  Procedure(s): Transcatheter Aortic Valve Replacement, Transfemoral (Left) INTRAOPERATIVE TRANSTHORACIC ECHOCARDIOGRAM (N/A) as a surgical intervention.  The patient's history has been reviewed, patient examined, no change in status, stable for surgery.  I have reviewed the patient's chart and labs.  Questions were answered to the patient's satisfaction.     Coralie Common

## 2022-04-14 NOTE — Discharge Instructions (Signed)
ACTIVITY AND EXERCISE  Daily activity and exercise are an important part of your recovery. People recover at different rates depending on their general health and type of valve procedure.  Most people recovering from TAVR feel better relatively quickly   No lifting, pushing, pulling more than 10 pounds (examples to avoid: groceries, vacuuming, gardening, golfing):             - For one week with a procedure through the groin.             - For six weeks for procedures through the chest wall or neck. NOTE: You will typically see one of our providers 7-14 days after your procedure to discuss WHEN TO RESUME the above activities.      DRIVING  Do not drive until you are seen for follow up and cleared by a provider. Generally, we ask patient to not drive for 1 week after their procedure.  If you have been told by your doctor in the past that you may not drive, you must talk with him/her before you begin driving again.   DRESSING  Groin site: you may leave the clear dressing over the site for up to one week or until it falls off.   HYGIENE  If you had a femoral (leg) procedure, you may take a shower when you return home. After the shower, pat the site dry. Do NOT use powder, oils or lotions in your groin area until the site has completely healed.  If you had a chest procedure, you may shower when you return home unless specifically instructed not to by your discharging practitioner.             - DO NOT scrub incision; pat dry with a towel.             - DO NOT apply any lotions, oils, powders to the incision.             - No tub baths / swimming for at least 2 weeks.  If you notice any fevers, chills, increased pain, swelling, bleeding or pus, please contact your doctor.   ADDITIONAL INFORMATION  If you are going to have an upcoming dental procedure, please contact our office as you will require antibiotics ahead of time to prevent infection on your heart valve.    If you have any questions  or concerns you can call the structural heart phone during normal business hours 8am-4pm. If you have an urgent need after hours or weekends please call [phone] to talk to the on call provider for general cardiology. If you have an emergency that requires immediate attention, please call 911.    After TAVR Checklist  Check  Test Description   Follow up appointment in 1-2 weeks  You will see our structural heart advanced practice provider. Your incision sites will be checked and you will be cleared to drive and resume all normal activities if you are doing well.     1 month echo and follow up  You will have an echo to check on your new heart valve and be seen back in the office by a structural heart advanced practice provider.   Follow up with your primary cardiologist You will need to be seen by your primary cardiologist in the following 3-6 months after your 1 month appointment in the valve clinic.    1 year echo and follow up You will have another echo to check on your heart valve after 1 year   and be seen back in the office by a structural heart advanced practice provider. This your last structural heart visit.   Bacterial endocarditis prophylaxis  You will have to take antibiotics for the rest of your life before all dental procedures (even teeth cleanings) to protect your heart valve. Antibiotics are also required before some surgeries. Please check with your cardiologist before scheduling any surgeries. Also, please make sure to tell us if you have a penicillin allergy as you will require an alternative antibiotic.

## 2022-04-14 NOTE — Anesthesia Procedure Notes (Signed)
Arterial Line Insertion Start/End1/30/2024 10:45 AM, 04/14/2022 10:45 AM Performed by: Darral Dash, DO, Genelle Bal, CRNA, CRNA  Patient location: Pre-op. Preanesthetic checklist: patient identified, IV checked, site marked, risks and benefits discussed, surgical consent, monitors and equipment checked, pre-op evaluation, timeout performed and anesthesia consent Lidocaine 1% used for infiltration Right, radial was placed Catheter size: 20 G Hand hygiene performed  and maximum sterile barriers used   Attempts: 1 Procedure performed without using ultrasound guided technique. Following insertion, dressing applied. Post procedure assessment: normal and unchanged  Patient tolerated the procedure well with no immediate complications.

## 2022-04-14 NOTE — Progress Notes (Signed)
  [place] VALVE TEAM  Patient doing well s/p TAVR. He is hemodynamically stable. Groin sites stable. ECG with afib and new LBBB but no high grade block. Plan to DC arterial line and transfer to 4E. Plan for early ambulation after bedrest completed and hopeful discharge over the next 24-48 hours.   Angelena Form PA-C  [facility]  Pager [phone]

## 2022-04-15 ENCOUNTER — Encounter (HOSPITAL_COMMUNITY): Payer: Self-pay | Admitting: Cardiovascular Disease

## 2022-04-15 ENCOUNTER — Inpatient Hospital Stay (HOSPITAL_COMMUNITY): Payer: Medicare Other

## 2022-04-15 DIAGNOSIS — I4821 Permanent atrial fibrillation: Secondary | ICD-10-CM | POA: Diagnosis not present

## 2022-04-15 DIAGNOSIS — E1122 Type 2 diabetes mellitus with diabetic chronic kidney disease: Secondary | ICD-10-CM | POA: Diagnosis not present

## 2022-04-15 DIAGNOSIS — Z006 Encounter for examination for normal comparison and control in clinical research program: Secondary | ICD-10-CM | POA: Diagnosis not present

## 2022-04-15 DIAGNOSIS — Z952 Presence of prosthetic heart valve: Secondary | ICD-10-CM

## 2022-04-15 DIAGNOSIS — I350 Nonrheumatic aortic (valve) stenosis: Secondary | ICD-10-CM | POA: Diagnosis not present

## 2022-04-15 LAB — CBC
HCT: 44.6 % (ref 39.0–52.0)
Hemoglobin: 15.4 g/dL (ref 13.0–17.0)
MCH: 29.8 pg (ref 26.0–34.0)
MCHC: 34.5 g/dL (ref 30.0–36.0)
MCV: 86.3 fL (ref 80.0–100.0)
Platelets: 115 10*3/uL — ABNORMAL LOW (ref 150–400)
RBC: 5.17 MIL/uL (ref 4.22–5.81)
RDW: 14.2 % (ref 11.5–15.5)
WBC: 7.7 10*3/uL (ref 4.0–10.5)
nRBC: 0 % (ref 0.0–0.2)

## 2022-04-15 LAB — BASIC METABOLIC PANEL
Anion gap: 6 (ref 5–15)
BUN: 17 mg/dL (ref 8–23)
CO2: 28 mmol/L (ref 22–32)
Calcium: 8.6 mg/dL — ABNORMAL LOW (ref 8.9–10.3)
Chloride: 101 mmol/L (ref 98–111)
Creatinine, Ser: 1.29 mg/dL — ABNORMAL HIGH (ref 0.61–1.24)
GFR, Estimated: 53 mL/min — ABNORMAL LOW (ref >60)
Glucose, Bld: 110 mg/dL — ABNORMAL HIGH (ref 70–99)
Potassium: 4.2 mmol/L (ref 3.5–5.1)
Sodium: 135 mmol/L (ref 135–145)

## 2022-04-15 LAB — MAGNESIUM: Magnesium: 2.2 mg/dL (ref 1.7–2.4)

## 2022-04-15 MED ORDER — WARFARIN SODIUM 4 MG PO TABS: 4.0000 mg | ORAL_TABLET | ORAL | Status: DC

## 2022-04-15 NOTE — Plan of Care (Signed)
  Problem: Activity: Goal: Ability to return to baseline activity level will improve Outcome: Progressing   Problem: Cardiovascular: Goal: Ability to achieve and maintain adequate cardiovascular perfusion will improve Outcome: Progressing Goal: Vascular access site(s) Level 0-1 will be maintained Outcome: Progressing

## 2022-04-15 NOTE — Progress Notes (Signed)
CARDIAC REHAB PHASE I   Pt resting in bed feeling well this morning. Reports ambulating independently in room this morning using front wheel walker. Denies CP, dizziness or SOB with activity. Post TAVR education including site care, restrictions, risk factors, heart healthy diabetic diet, exercise guidelines and CRP2 reviewed. All questions and concerns addressed. Will refer to [facility] for CRP2. Plan for home today.   1010-1040   Vanessa Barbara, RN BSN 04/15/2022 10:27 AM

## 2022-04-15 NOTE — Progress Notes (Signed)
  Echocardiogram 2D Echocardiogram has been performed.  Darlina Sicilian M 04/15/2022, 9:20 AM

## 2022-04-15 NOTE — Plan of Care (Signed)
POC initiated and progressing. 

## 2022-04-15 NOTE — Anesthesia Postprocedure Evaluation (Signed)
Anesthesia Post Note  Patient: [name]  Procedure(s) Performed: Transcatheter Aortic Valve Replacement, Transfemoral (Left) INTRAOPERATIVE TRANSTHORACIC ECHOCARDIOGRAM     Patient location during evaluation: PACU Anesthesia Type: MAC Level of consciousness: awake and alert Pain management: pain level controlled Vital Signs Assessment: post-procedure vital signs reviewed and stable Respiratory status: spontaneous breathing, nonlabored ventilation, respiratory function stable and patient connected to nasal cannula oxygen Cardiovascular status: stable and blood pressure returned to baseline Postop Assessment: no apparent nausea or vomiting Anesthetic complications: no   There were no known notable events for this encounter.  Last Vitals:  Vitals:   04/15/22 0300 04/15/22 0740  BP: 106/67 130/65  Pulse: 77 90  Resp: 15 17  Temp:  (!) 36.4 C  SpO2: 96% 96%    Last Pain:  Vitals:   04/15/22 0740  TempSrc: Oral  PainSc:                  March Rummage Sanel Stemmer

## 2022-04-16 ENCOUNTER — Telehealth: Payer: Self-pay

## 2022-04-16 ENCOUNTER — Telehealth: Payer: Self-pay | Admitting: Physician Assistant

## 2022-04-16 ENCOUNTER — Telehealth (HOSPITAL_COMMUNITY): Payer: Self-pay

## 2022-04-16 LAB — ECHOCARDIOGRAM COMPLETE
AR max vel: 2.59 cm2
AV Area VTI: 2.93 cm2
AV Area mean vel: 2.45 cm2
AV Mean grad: 8 mmHg
AV Peak grad: 14.6 mmHg
Ao pk vel: 1.91 m/s
Area-P 1/2: 4.08 cm2
Height: 66 in
MV VTI: 1.94 cm2
S' Lateral: 2.12 cm
Weight: 3470.92 oz

## 2022-04-16 NOTE — Telephone Encounter (Signed)
  [place] VALVE TEAM   Patient contacted regarding discharge from [facility] on 1/31  Patient understands to follow up with a structural heart APP on 2/9 at [place].  Patient understands discharge instructions? yes Patient understands medications and regimen? yes Patient understands to bring all medications to this visit? yes  Angelena Form PA-C  [facility]

## 2022-04-16 NOTE — Telephone Encounter (Signed)
Called patient to see if he is interested in the Cardiac Rehab Program. Patient expressed interest. Explained scheduling process and went over insurance, patient verbalized understanding. Will contact patient for scheduling once f/u has been completed.  °

## 2022-04-16 NOTE — Telephone Encounter (Signed)
Pt insurance is active and benefits verified through Medicare A/B. Co-pay $0.00, DED $240.00/$240.00 met, out of pocket $0.00/$0.00 met, co-insurance 20%. No pre-authorization required. Passport, 04/16/22 @ 4:09PM, REF#[id number]   How many CR sessions are covered? (36 sessions for TCR, 72 sessions for ICR)72 Is this a lifetime maximum or an annual maximum? Lifetime Has the member used any of these services to date? No Is there a time limit (weeks/months) on start of program and/or program completion? No   2ndary insurance is active and benefits verified through [place].. Co-pay $0.00, DED $0.00/$0.00 met, out of pocket $0.00/$0.00 met, co-insurance 0%. No pre-authorization required. Passport, 04/16/22 @ 4:12PM, TTC#[id number]    Will contact patient to see if he is interested in the Cardiac Rehab Program. If interested, patient will need to complete follow up appt. Once completed, patient will be contacted for scheduling upon review by the RN Navigator.

## 2022-04-16 NOTE — Patient Outreach (Signed)
  Care Coordination [facility] Note Transition Care Management Follow-up Telephone Call Date of discharge and from where[facility] How have you been since you were released from the hospital? "I feel great this morning" Any questions or concerns? No  Items Reviewed: Did the pt receive and understand the discharge instructions provided? Yes  Medications obtained and verified? Yes  Other? No  Any new allergies since your discharge? No  Dietary orders reviewed? No Do you have support at home? Yes   Home Care and Equipment/Supplies: Were home health services ordered? no If so, what is the name of the agency? N/a  Has the agency set up a time to come to the patient's home? not applicable Were any new equipment or medical supplies ordered?  No What is the name of the medical supply agency? N/a Were you able to get the supplies/equipment? not applicable Do you have any questions related to the use of the equipment or supplies? No  Functional Questionnaire: (I = Independent and D = Dependent) ADLs: I  Bathing/Dressing- I  Meal Prep- I  Eating- I  Maintaining continence- I  Transferring/Ambulation- I  Managing Meds- I  Follow up appointments reviewed:  PCP Hospital f/u appt confirmed? No   Specialist Hospital f/u appt confirmed? Yes  Scheduled to see Kathyrn Drown, NP (cardiology) on 04/24/22 @ 0900. Are transportation arrangements needed? No  If their condition worsens, is the pt aware to call PCP or go to the Emergency Dept.? Yes Was the patient provided with contact information for the PCP's office or ED? Yes Was to pt encouraged to call back with questions or concerns? Yes  SDOH assessments and interventions completed:   Yes SDOH Interventions Today    Flowsheet Row Most Recent Value  SDOH Interventions   Food Insecurity Interventions Intervention Not Indicated  Housing Interventions Intervention Not Indicated       Care Coordination Interventions:  No Care  Coordination interventions needed at this time.   Encounter Outcome:  Pt. Visit Completed

## 2022-04-21 ENCOUNTER — Ambulatory Visit: Payer: Medicare Other | Attending: Internal Medicine

## 2022-04-21 DIAGNOSIS — Z5181 Encounter for therapeutic drug level monitoring: Secondary | ICD-10-CM | POA: Insufficient documentation

## 2022-04-21 DIAGNOSIS — I482 Chronic atrial fibrillation, unspecified: Secondary | ICD-10-CM | POA: Diagnosis not present

## 2022-04-21 LAB — POCT INR: INR: 1.3 — AB (ref 2.0–3.0)

## 2022-04-21 NOTE — Patient Instructions (Signed)
TAKE ANOTHER 1 TABLET TODAY AND TAKE 1.5 TABLETS ON WEDNESDAY THEN continue to take warfarin 1 tablet daily excpet for 1.5 tablets on Sunday and Thursdays. INR in 2 weeks. [phone].

## 2022-04-21 NOTE — Progress Notes (Signed)
HEART AND [place]                                     Cardiology Office Note:    Date:  04/24/2022   ID:  [name], DOB 1930-05-19, MRN OR:[id number]  PCP:  Tonia Ghent, MD  [facility] Cardiologist:  Sherren Mocha, MD  [facility] Electrophysiologist:  None   Referring MD: Tonia Ghent, MD   Chief Complaint  Patient presents with   Follow-up    [facility] s/p TAVR   History of Present Illness:    [name] is a 89 y.o. male with a hx of CAD s/p PCI/RCA 03/11/22, CKD stage IIIb, obesity (BMI 32), OSA on CPAP, HTN, HLD, permanent atrial fibrillation on Coumadin and severe aortic stenosis who presented to [facility] on 04/14/22 for planned TAVR and is being seen today for TOC follow up.    [name] is followed by Dr. Burt Knack. He was seen in 08/2021 with stable symptoms of mild exertional dyspnea. At that time an echocardiogram suggested severe paradoxical low-flow low gradient aortic stenosis. In the setting of his advanced age and stable symptoms, we elected to have him return for 62-monthfollow-up with a repeat echocardiogram. His most recent echocardiogram 01/30/22 showed EF 65% and severe AS with mean grad 25 mmHg, peak grad 48.9 mmHg, AVA 0.94 cm2, DVI 0.33, SVI 30 as well as mild MS & TR. The patient also reported significant progression in his symptoms with worsening shortness of breath, chest discomfort, and lightheadedness. [facility] two-vessel CAD with severe stenosis of the proximal RCA, treated with PCI using a 3.0 x 24 mm Synergy DES and severe stenosis of the OM branch of the circumflex with recommendations for medical therapy due to lesion complexity. There was moderate nonobstructive diffuse proximal to mid LAD stenosis estimated at 50%. He was discharged on Plavix and Coumadin. No aspirin given elevated bleeding risk.    He was then evaluated by the multidisciplinary valve team and felt to have severe,  symptomatic aortic stenosis and to be a suitable candidate.   He is now s/p successful TAVR with a 23 mm Edwards Sapien 3 Ultra Resilia THV via the TF approach on 04/14/22. Post operative echo showed stable valve function with a normal EF, mean gradient at 842mg, and AVA at 2.93cm2. He had a new LBBB immediately after TAVR but this has regressed to a narrow QRS by discharge. He was continued on Plavix and Coumadin given recent PCI.   Today he is here with his daughter and reports that he has been doing well since discharge. He has more energy and able to tolerate activity more. He denies chest pain, palpitations, LE edema, bleeding in stool or urine, SOB, orthopnea, dizziness, or syncope. He was seen for INR check which came back slightly low so he has a follow up early next week. Discussed pulm nodule and although he is low risk, he would like to follow with CT scan at one year.   Past Medical History:  Diagnosis Date   Arthritis    Bradycardia    a. nocturnal with pauses overnight on tele likely due to OSA 10/2015.   CKD (chronic kidney disease), stage III (HCC)    Coronary artery disease    a. prev nonobst. b. LHC 10/24/15: occlusion of small diffusely diseased OM2 s/p balloon angioplasty,  mild nonobstructive disease of mLAD and mRCA, normal LVEDP   Diabetes mellitus without complication ([place])    [id number] Pt's daughter states patient does not  have diabetes.  A1C 07/23/20 was 7.2, patientr had been on medication.   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 CBurt Knack& Dr. WLavonna Monarch  Severe aortic stenosis    Vertigo     Past Surgical History:  Procedure Laterality Date   AMPUTATION Right 03/03/2019   Procedure: RIGHT 2ND TOE AMPUTATION;  Surgeon: DNewt Minion MD;  [place]  Service: Orthopedics;  Laterality: Right;   AMPUTATION Right 01/03/2021   Procedure: RIGHT 3RD TOE AMPUTATION;  Surgeon: DNewt Minion MD;  [place]  Service: Orthopedics;  Laterality: Right;   BALLOON DILATION N/A 10/22/2014   Procedure: BALLOON DILATION;  Surgeon: CGatha Mayer MD;  [facility];  Service: Endoscopy;  Laterality: N/A;   CARDIAC CATHETERIZATION  07/2008   CARDIAC CATHETERIZATION N/A 10/24/2015   Procedure: Left Heart Cath and Coronary Angiography;  Surgeon: CNelva Bush MD;  [place] [facility];  Service: Cardiovascular;  Laterality: N/A;   CARDIAC CATHETERIZATION N/A 10/24/2015   Procedure: Coronary Balloon Angioplasty;  Surgeon: CNelva Bush MD;  [place] [facility];  Service: Cardiovascular;  Laterality: N/A;   Carotid Dopplers  09/2009   no sig extracranial stenosis and vertebral arteries had antegrade flow   CATARACT EXTRACTION W/ INTRAOCULAR LENS  IMPLANT, BILATERAL Bilateral    CORONARY STENT INTERVENTION N/A 03/11/2022   Procedure: CORONARY STENT INTERVENTION;  Surgeon: CSherren Mocha MD;  [place] [facility];  Service: Cardiovascular;  Laterality: N/A;   ESOPHAGOGASTRODUODENOSCOPY (EGD) WITH ESOPHAGEAL DILATION     "I've had it stretched 3 times" (10/23/2015)   ESOPHAGOGASTRODUODENOSCOPY (EGD) WITH ESOPHAGEAL DILATION  06/2011   /medical hx above  (09/09/2017)   ESOPHAGOGASTRODUODENOSCOPY (EGD) WITH PROPOFOL N/A 10/22/2014   Procedure: ESOPHAGOGASTRODUODENOSCOPY (EGD) WITH PROPOFOL;  Surgeon: CGatha Mayer MD;  [facility];  Service: Endoscopy;  Laterality: N/A;   FINGER SURGERY Right 02/1964   "cut little finger off; had it reattached"   I & D EXTREMITY Right 09/10/2017   Procedure: RIGHT FOOT DEBRIDEMENT;  Surgeon: DNewt Minion MD;  [place]  Service: Orthopedics;  Laterality: Right;   INTRAOPERATIVE TRANSTHORACIC ECHOCARDIOGRAM N/A 04/14/2022   Procedure: INTRAOPERATIVE TRANSTHORACIC ECHOCARDIOGRAM;  Surgeon: CSherren Mocha MD;  [place] [facility];  Service: Open Heart Surgery;  Laterality: N/A;   MRI of brain  09/2009   chronic microvascular ischemia   OSA Sleep Study     per Dr. DBrett Fairy  RIGHT/LEFT HEART CATH AND CORONARY ANGIOGRAPHY N/A 03/11/2022   Procedure: RIGHT/LEFT HEART CATH AND CORONARY ANGIOGRAPHY;  Surgeon: CSherren Mocha MD;  Location:  [place] [facility];  Service: Cardiovascular;  Laterality: N/A;   SHOULDER ARTHROSCOPY W/ ROTATOR CUFF REPAIR Right 2008   TRANSCATHETER AORTIC VALVE REPLACEMENT, TRANSFEMORAL Left 04/14/2022   Procedure: Transcatheter Aortic Valve Replacement, Transfemoral;  Surgeon: Sherren Mocha, MD;  Location: [place] CV LAB;  Service: Open Heart Surgery;  Laterality: Left;    Current Medications: Current Meds  Medication Sig   acetaminophen (TYLENOL) 500 MG tablet Take 500 mg by mouth as needed for mild pain.   allopurinol (ZYLOPRIM) 100 MG tablet TAKE 1 TABLET BY MOUTH EVERY DAY   azithromycin (ZITHROMAX) 500 MG tablet TAKE 500 MG BY MOUTH  1 HOUR PRIOR TO ANY DENTAL CLEANINGS AND PROCEDURES.   clopidogrel (PLAVIX) 75 MG tablet Take 1 tablet (75 mg total) by mouth daily.   cyanocobalamin (,VITAMIN B-12,) 1000 MCG/ML injection 1057mg IM every 28 days (Patient taking differently: Inject 1,000 mcg into the muscle every 30 (thirty) days.)   diltiazem (CARDIZEM CD) 180 MG 24 hr capsule Take 1 capsule (180 mg total) by mouth daily.   furosemide (LASIX) 20 MG tablet Take 1 tablet (20 mg total) by mouth daily.   lisinopril (ZESTRIL) 20 MG  tablet Take 10 mg by mouth in the morning and at bedtime.   metoprolol tartrate (LOPRESSOR) 25 MG tablet Take 0.5 tablets (12.5 mg total) by mouth 2 (two) times daily.   pantoprazole (PROTONIX) 40 MG tablet Take 1 tablet (40 mg total) by mouth daily.   tamsulosin (FLOMAX) 0.4 MG CAPS capsule TAKE 1 CAPSULE BY MOUTH EVERY DAY (Patient taking differently: Take 0.4 mg by mouth daily.)   warfarin (COUMADIN) 4 MG tablet Take 1-1.5 tablets (4-6 mg total) by mouth See admin instructions. 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   Smoking status: Former    Packs/day: 0.12    Years: 4.00    Total pack years: 0.48    Types: Cigarettes    Quit date: 03/16/1969    Years since quitting: 53.1   Smokeless tobacco: Former   Tobacco comments:    "chewed when I smoked; smoked 1 pack/week for about 4 years, quit 1971  Vaping Use   Vaping Use: Never used  Substance and Sexual Activity   Alcohol use: Not Currently    Alcohol/week: 0.0 standard drinks of alcohol    Comment: 09/09/2017  "Quit all alcohol in 1970's"   Drug use: Never   Sexual activity: Not on file  Other Topics Concern   Not on file  Social History Narrative   [place] 54[place]   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   Daily caffeine    Social Determinants of Health   Financial Resource Strain: Low Risk  (11/27/2021)   Overall  Financial Resource Strain (CARDIA)    Difficulty of Paying Living Expenses: Not hard at all  Food Insecurity: No Food Insecurity (04/16/2022)   Hunger Vital Sign  Worried About Charity fundraiser in the Last Year: Never true    [place] in the Last Year: Never true  Transportation Needs: No Transportation Needs (11/27/2021)   PRAPARE - Hydrologist (Medical): No    Lack of Transportation (Non-Medical): No  Physical Activity: Insufficiently Active (11/27/2021)   Exercise Vital Sign    Days of Exercise per Week: 7 days    Minutes of Exercise per Session: 20 min  Stress: No Stress Concern Present (11/27/2021)   [place]    Feeling of Stress : Not at all  Social Connections: Moderately Integrated (11/27/2021)   Social Connection and Isolation Panel [NHANES]    Frequency of Communication with Friends and Family: Twice a week    Frequency of Social Gatherings with Friends and Family: Twice a week    Attends Religious Services: 1 to 4 times per year    Active Member of [employer] or Organizations: No    Attends Music therapist: More than 4 times per year    Marital Status: Widowed     Family History: The patient's family history includes Dementia in his father; Heart disease in his brother; Hypertension in his father and another family member; Pneumonia in his father; Sleep apnea in his brother and daughter; Stomach cancer in his mother. There is no history of Prostate cancer or Colon cancer.  ROS:   Please see the history of present illness.    All other systems reviewed and are negative.  EKGs/Labs/Other Studies Reviewed:    The following studies were reviewed today:  TAVR OPERATIVE NOTE     Date of Procedure:                04/14/2022   Preoperative Diagnosis:      Severe Aortic Stenosis    Postoperative Diagnosis:    Same    Procedure:        Transcatheter Aortic  Valve Replacement - Percutaneous Left Transfemoral Approach             Edwards Sapien 3 Ultra THV (size 23 mm, model # 9755RSL, serial # [id number])              Co-Surgeons:                        Coralie Common MD and Sherren Mocha, MD      Anesthesiologist:                  Lahoma Crocker DO   Echocardiographer:              Tish Men MD   Pre-operative Echo Findings: Severe aortic stenosis Normal left ventricular systolic function   Post-operative Echo Findings: no paravalvular leak normal left ventricular systolic function    Echo 04/15/22:   1. Echo finding are consistent with normal structure and function of the  aortic valve prosthesis. The aortic valve has been repaired/replaced.  Aortic valve regurgitation is not visualized. No aortic stenosis is  present. There is a 23 mm Sapien  prosthetic (TAVR) valve present in the aortic position. Procedure Date:  04/14/2022. Aortic valve area, by VTI measures 2.93 cm. Aortic valve mean  gradient measures 8.0 mmHg. Aortic valve Vmax measures 1.91 m/s.   2. Left ventricular ejection fraction, by estimation, is 70 to 75%. The  left ventricle has hyperdynamic function. The left ventricle has  no  regional wall motion abnormalities. There is moderate asymmetric left  ventricular hypertrophy of the basal-septal   segment. Left ventricular diastolic function could not be evaluated.   3. Right ventricular systolic function is normal. The right ventricular  size is normal. There is moderately elevated pulmonary artery systolic  pressure. The estimated right ventricular systolic pressure is [id number] mmHg.   4. Left atrial size was mild to moderately dilated.   5. The mitral valve is degenerative. Trivial mitral valve regurgitation.  Mild mitral stenosis. The mean mitral valve gradient is 5.3 mmHg with  average heart rate of 90 bpm. Severe mitral annular calcification.   6. The inferior vena cava is normal in size with greater than 50%   respiratory variability, suggesting right atrial pressure of 3 mmHg.   EKG:  EKG is ordered today.  The ekg ordered today demonstrates AF with HR 70bpm.   Recent Labs: 04/10/2022: ALT 22 04/15/2022: BUN 17; Creatinine, Ser 1.29; Hemoglobin 15.4; Magnesium 2.2; Platelets 115; Potassium 4.2; Sodium 135  Recent Lipid Panel    Component Value Date/Time   CHOL 166 05/16/2019 0923   TRIG 279.0 (H) 05/16/2019 0923   HDL 26.40 (L) 05/16/2019 0923   CHOLHDL 6 05/16/2019 0923   VLDL 55.8 (H) 05/16/2019 0923   LDLCALC 82 10/24/2015 0430   LDLDIRECT 95.0 05/16/2019 0923   Physical Exam:    VS:  BP 112/72   Pulse 70   Ht 5' 6"$  (1.676 m)   Wt 221 lb 9.6 oz (100.5 kg)   SpO2 97%   BMI 35.77 kg/m     Wt Readings from Last 3 Encounters:  04/24/22 221 lb 9.6 oz (100.5 kg)  04/14/22 216 lb 14.9 oz (98.4 kg)  04/10/22 209 lb 12.8 oz (95.2 kg)    General: Well developed, well nourished, NAD Neck: Negative for carotid bruits. No JVD Lungs:Clear to ausculation bilaterally. No wheezes, rales, or rhonchi. Breathing is unlabored. Cardiovascular: Irregularly irregular. No murmurs Extremities: No edema. Groin site stable.  Neuro: Alert and oriented. No focal deficits. No facial asymmetry. MAE spontaneously. Psych: Responds to questions appropriately with normal affect.    ASSESSMENT/PLAN:    Severe AS: Patient doing well with NYHA class I symptoms s/p successful TAVR with a 23 mm Edwards Sapien 3 Ultra Resilia THV via the TF approach on 04/14/22. Groin sites remain stable. Post operative echo with normal EF, mean gradient at 52mHg, and AVA at 2.93cm2. EKG today with atrial fibrillation however no evidence of persistent LBBB. Continue Plavix and Coumadin given recent PCI. Dental SBE discussed and sent to pharmacy. Plan one month follow up with echocardiogram.   [facility] showed two-vessel CAD with severe stenosis of the proximal RCA, treated with PCI using a 3.0 x 24 mm Synergy DES and  severe stenosis of the OM branch of the circumflex with recommendations for medical therapy due to lesion complexity. There was moderate nonobstructive diffuse proximal to mid LAD stenosis estimated at 50%. Continued on Plavix and Coumadin given recent PCI (no aspirin given elevated bleeding risk). Denies anginal symptoms.    Permanent atrial fibrillation: Rate controlled on EKG today. Continue AC with Coumadin and AV nodal blocking agents. Follows with [facility].   CKD stage IIIb: Creat stable ~ 1.29.    Pulmonary nodule: pre TAVR CT showed a "solid apical right upper lobe 0.4 cm pulmonary nodule, new since 05/08/2006 chest CT. Although likely benign, if the patient is high-risk, given the morphology and/or location of this  nodule a non-contrast chest CT can be considered in 12 months." Although considered low risk patient wishes to have 12 month CT scan for completion. {  Medication Adjustments/Labs and Tests Ordered: Current medicines are reviewed at length with the patient today.  Concerns regarding medicines are outlined above.   Orders Placed This Encounter  Procedures   EKG 12-Lead   Meds ordered this encounter  Medications   azithromycin (ZITHROMAX) 500 MG tablet    Sig: TAKE 500 MG BY MOUTH  1 HOUR PRIOR TO ANY DENTAL CLEANINGS AND PROCEDURES.    Dispense:  1 tablet    Refill:  12    Patient Instructions  Medication Instructions:  Your physician has recommended you make the following change in your medication:  TAKE AZITHROMYCIN 500 MG 1 HOUR PRIOR TO ANY DENTAL CLEANINGS AND PROCEDURES. *If you need a refill on your cardiac medications before your next appointment, please call your pharmacy*   Lab Work: NONE If you have labs (blood work) drawn today and your tests are completely normal, you will receive your results only by: [place] (if you have MyChart) OR A paper copy in the mail If you have any lab test that is abnormal or we need to change  your treatment, we will call you to review the results.   Testing/Procedures: NONE   Follow-Up: At [facility], you and your health needs are our priority.  As part of our continuing mission to provide you with exceptional heart care, we have created designated Provider Care Teams.  These Care Teams include your primary Cardiologist (physician) and Advanced Practice Providers (APPs -  Physician Assistants and Nurse Practitioners) who all work together to provide you with the care you need, when you need it.  We recommend signing up for the patient portal called "MyChart".  Sign up information is provided on this After Visit Summary.  MyChart is used to connect with patients for Virtual Visits (Telemedicine).  Patients are able to view lab/test results, encounter notes, upcoming appointments, etc.  Non-urgent messages can be sent to your provider as well.   To learn more about what you can do with MyChart, go to [email or web address].    Your next appointment:   KEEP SCHEDULED FOLLOW-UP   Signed, Kathyrn Drown, NP  04/24/2022 1:21 PM    [facility]

## 2022-04-24 ENCOUNTER — Telehealth: Payer: Self-pay

## 2022-04-24 ENCOUNTER — Ambulatory Visit: Payer: Medicare Other | Attending: Cardiology | Admitting: Cardiology

## 2022-04-24 VITALS — BP 112/72 | HR 70 | Ht 66.0 in | Wt 221.6 lb

## 2022-04-24 DIAGNOSIS — R911 Solitary pulmonary nodule: Secondary | ICD-10-CM

## 2022-04-24 DIAGNOSIS — Z7901 Long term (current) use of anticoagulants: Secondary | ICD-10-CM

## 2022-04-24 DIAGNOSIS — Z955 Presence of coronary angioplasty implant and graft: Secondary | ICD-10-CM | POA: Diagnosis not present

## 2022-04-24 DIAGNOSIS — I350 Nonrheumatic aortic (valve) stenosis: Secondary | ICD-10-CM | POA: Diagnosis not present

## 2022-04-24 DIAGNOSIS — I10 Essential (primary) hypertension: Secondary | ICD-10-CM | POA: Diagnosis not present

## 2022-04-24 DIAGNOSIS — E7849 Other hyperlipidemia: Secondary | ICD-10-CM

## 2022-04-24 DIAGNOSIS — Z952 Presence of prosthetic heart valve: Secondary | ICD-10-CM

## 2022-04-24 DIAGNOSIS — I482 Chronic atrial fibrillation, unspecified: Secondary | ICD-10-CM

## 2022-04-24 MED ORDER — AZITHROMYCIN 500 MG PO TABS: ORAL_TABLET | ORAL | 12 refills | Status: DC

## 2022-04-24 NOTE — Telephone Encounter (Signed)
Patients daughter called and stated that her dad saw cardiology on 04/21/22. They wanted him to start otc nexium but she would like a prescription so its cheaper. Daughter wanted Dr. Damita Dunnings to do this.

## 2022-04-24 NOTE — Patient Instructions (Signed)
Medication Instructions:  Your physician has recommended you make the following change in your medication:  TAKE AZITHROMYCIN 500 MG 1 HOUR PRIOR TO ANY DENTAL CLEANINGS AND PROCEDURES. *If you need a refill on your cardiac medications before your next appointment, please call your pharmacy*   Lab Work: NONE If you have labs (blood work) drawn today and your tests are completely normal, you will receive your results only by: [place] (if you have MyChart) OR A paper copy in the mail If you have any lab test that is abnormal or we need to change your treatment, we will call you to review the results.   Testing/Procedures: NONE   Follow-Up: At [facility], you and your health needs are our priority.  As part of our continuing mission to provide you with exceptional heart care, we have created designated Provider Care Teams.  These Care Teams include your primary Cardiologist (physician) and Advanced Practice Providers (APPs -  Physician Assistants and Nurse Practitioners) who all work together to provide you with the care you need, when you need it.  We recommend signing up for the patient portal called "MyChart".  Sign up information is provided on this After Visit Summary.  MyChart is used to connect with patients for Virtual Visits (Telemedicine).  Patients are able to view lab/test results, encounter notes, upcoming appointments, etc.  Non-urgent messages can be sent to your provider as well.   To learn more about what you can do with MyChart, go to [email or web address].    Your next appointment:   KEEP SCHEDULED FOLLOW-UP

## 2022-04-26 NOTE — Telephone Encounter (Signed)
Before I change anything on this patient's med list, I would like your input.  I did not see any discussion of Nexium in your office visit note.  Thanks.

## 2022-04-27 NOTE — Telephone Encounter (Signed)
Thanks for reaching out. I recommended that he try any OTC for GERD. He was taking omeprazole however this interacts with Plavix so I asked him to stop. Would you like me to send in for Nexium?   Thanks!  Sharee Pimple

## 2022-04-27 NOTE — Telephone Encounter (Signed)
If you are okay sending it after talking to him about it, then I will defer and I thank you for addressing it.  He already has rx for protontix in the EMR but he likely doesn't have refills on that.  I appreciate your help.

## 2022-04-28 ENCOUNTER — Other Ambulatory Visit: Payer: Self-pay | Admitting: Cardiology

## 2022-04-28 ENCOUNTER — Other Ambulatory Visit: Payer: Self-pay | Admitting: Cardiovascular Disease

## 2022-04-28 MED ORDER — PANTOPRAZOLE SODIUM 40 MG PO TBEC: 40.0000 mg | DELAYED_RELEASE_TABLET | Freq: Every day | ORAL | 2 refills | Status: DC

## 2022-05-04 ENCOUNTER — Ambulatory Visit (INDEPENDENT_AMBULATORY_CARE_PROVIDER_SITE_OTHER): Payer: Medicare Other | Admitting: Orthopedic Surgery

## 2022-05-04 DIAGNOSIS — L97511 Non-pressure chronic ulcer of other part of right foot limited to breakdown of skin: Secondary | ICD-10-CM | POA: Diagnosis not present

## 2022-05-05 ENCOUNTER — Ambulatory Visit: Payer: Medicare Other | Attending: Cardiovascular Disease | Admitting: *Deleted

## 2022-05-05 DIAGNOSIS — Z952 Presence of prosthetic heart valve: Secondary | ICD-10-CM | POA: Diagnosis not present

## 2022-05-05 DIAGNOSIS — I482 Chronic atrial fibrillation, unspecified: Secondary | ICD-10-CM | POA: Insufficient documentation

## 2022-05-05 LAB — POCT INR: INR: 2.1 (ref 2.0–3.0)

## 2022-05-05 NOTE — Patient Instructions (Addendum)
Description   Continue taking warfarin 1 tablet daily except for 1.5 tablets on Sunday and Thursdays. INR in 3 weeks. [phone].

## 2022-05-13 ENCOUNTER — Encounter: Payer: Self-pay | Admitting: Orthopedic Surgery

## 2022-05-13 NOTE — Progress Notes (Signed)
Office Visit Note   Patient: [name]           Date of Birth: 1930/09/14           MRN: [id number] Visit Date: 05/04/2022              Requested by: Tonia Ghent, MD [address] [place],  [place] [address] PCP: Tonia Ghent, MD  Chief Complaint  Patient presents with   Right Foot - Wound Check      HPI: Patient is an 89 year old gentleman who presents in follow-up for right foot ulcer.  He is status post TAVR valve replacement.  Assessment & Plan: Visit Diagnoses:  1. Right foot ulcer, limited to breakdown of skin ([place])     Plan: Plan to follow-up in 4 weeks.  Patient may require a Weil osteotomy of the third metatarsal and a gastrocnemius recession.  Follow-Up Instructions: Return in about 4 weeks (around 06/01/2022).   Ortho Exam  Patient is alert, oriented, no adenopathy, well-dressed, normal affect, normal respiratory effort. Examination patient has a Wagner grade 1 ulcer plantar aspect right foot.  After informed consent a 10 blade knife was used debride the skin and soft tissue back to healthy viable tissue the wound measures 10 mm in diameter and 3 mm deep after debridement.  The wound does not probe to bone there is no undermining.  Patient does have Achilles contracture with dorsiflexion 10 degrees short of neutral with his knee extended.  Imaging: No results found. No images are attached to the encounter.  Labs: Lab Results  Component Value Date   HGBA1C 6.8 (H) 05/16/2021   HGBA1C 7.2 (H) 07/23/2020   HGBA1C 7.7 (H) 09/11/2019   ESRSEDRATE 17 (H) 09/09/2017   CRP 1.1 (H) 09/09/2017   LABURIC 7.1 05/16/2021   LABURIC 7.6 12/16/2020   LABURIC 7.5 07/23/2020   REPTSTATUS 09/19/2017 FINAL 09/14/2017   GRAMSTAIN  09/10/2017    MODERATE WBC PRESENT,BOTH PMN AND MONONUCLEAR NO ORGANISMS SEEN    CULT  09/14/2017    NO GROWTH 5 DAYS Performed at [facility], [place] [address] [place], [place] [address]      Lab Results   Component Value Date   ALBUMIN 4.1 04/10/2022   ALBUMIN 4.0 09/17/2020   ALBUMIN 4.2 07/23/2020    Lab Results  Component Value Date   MG 2.2 04/15/2022   MG 2.2 09/09/2017   No results found for: "VD25OH"  No results found for: "PREALBUMIN"    Latest Ref Rng & Units 04/15/2022    2:40 AM 04/14/2022    3:02 PM 04/10/2022    9:30 AM  CBC EXTENDED  WBC 4.0 - 10.5 K/uL 7.7   6.3   RBC 4.22 - 5.81 MIL/uL 5.17   5.65   Hemoglobin 13.0 - 17.0 g/dL 15.4  15.0  16.5   HCT 39.0 - 52.0 % 44.6  44.0  49.3   Platelets 150 - 400 K/uL 115   125      There is no height or weight on file to calculate BMI.  Orders:  No orders of the defined types were placed in this encounter.  No orders of the defined types were placed in this encounter.    Procedures: No procedures performed  Clinical Data: No additional findings.  ROS:  All other systems negative, except as noted in the HPI. Review of Systems  Objective: Vital Signs: There were no vitals taken for this visit.  Specialty Comments:  No specialty comments available.  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   Severe aortic stenosis 03/11/2022   Sinusitis 07/27/2021   PND (post-nasal drip) 07/17/2021   Obstructive sleep apnea treated with bilevel positive airway pressure (BiPAP) 03/19/2021   Lower urinary tract symptoms (LUTS) 09/16/2020   Gouty arthritis of left foot 06/20/2020   Diabetic peripheral neuropathy ([place]) 10/04/2019   B12 deficiency 05/17/2019   Idiopathic chronic gout, unspecified site, without tophus (tophi) 10/06/2017   CAD S/P percutaneous coronary angioplasty    Chronic atrial fibrillation ([place])    CKD (chronic kidney disease), stage III ([place])    Hypertension    Hyperlipidemia    ETD (eustachian tube dysfunction) 05/24/2015   Chronic anticoagulation 09/25/2014   Esophageal stricture from GERD 06/08/2011   OSA  on CPAP 09/22/2010   Gout 08/15/2008   OBESITY 08/15/2008   Essential hypertension 08/15/2008   Coronary artery disease with exertional angina ([place]) 08/15/2008   GASTROESOPHAGEAL REFLUX DISEASE 08/15/2008   Past Medical History:  Diagnosis Date   Arthritis    Bradycardia    a. nocturnal with pauses overnight on tele likely due to OSA 10/2015.   CKD (chronic kidney disease), stage III ([facility])    Coronary artery disease    a. prev nonobst. b. LHC 10/24/15: occlusion of small diffusely diseased OM2 s/p balloon angioplasty, mild nonobstructive disease of mLAD and mRCA, normal LVEDP   Diabetes mellitus without complication ([place])    [id number] Pt's daughter states patient does not  have diabetes.  A1C 07/23/20 was 7.2, patientr had been on medication.   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 CBurt Knack& Dr. WLavonna Monarch  Severe aortic stenosis    Vertigo     Family History  Problem Relation Age of Onset   Stomach cancer Mother        died in 636's   Hypertension Father        died in his 730'spna likely dementia   Dementia Father    Pneumonia Father    Heart disease Brother        S/P CABG   Sleep apnea Brother    Sleep apnea Daughter    Hypertension Other    Prostate cancer Neg Hx    Colon cancer Neg Hx     Past Surgical History:  Procedure Laterality Date   AMPUTATION Right 03/03/2019    Procedure: RIGHT 2ND TOE AMPUTATION;  Surgeon: DNewt Minion MD;  Location: [facility]  Service: Orthopedics;  Laterality: Right;   AMPUTATION Right 01/03/2021   Procedure: RIGHT 3RD TOE AMPUTATION;  Surgeon: DNewt Minion MD;  [facility]  Service: Orthopedics;  Laterality: Right;  BALLOON DILATION N/A 10/22/2014   Procedure: BALLOON DILATION;  Surgeon: [name], MD;  [facility];  Service: Endoscopy;  Laterality: N/A;   CARDIAC CATHETERIZATION  07/2008   CARDIAC CATHETERIZATION N/A 10/24/2015   Procedure: Left Heart Cath and Coronary Angiography;  Surgeon: [name], MD;  Location: [facility];  Service: Cardiovascular;  Laterality: N/A;   CARDIAC CATHETERIZATION N/A 10/24/2015   Procedure: Coronary Balloon Angioplasty;  Surgeon: [name], MD;  Location: [facility];  Service: Cardiovascular;  Laterality: N/A;   Carotid Dopplers  09/2009   no sig extracranial stenosis and vertebral arteries had antegrade flow   CATARACT EXTRACTION W/ INTRAOCULAR LENS  IMPLANT, BILATERAL Bilateral    CORONARY STENT INTERVENTION N/A 03/11/2022   Procedure: CORONARY STENT INTERVENTION;  Surgeon: Sherren Mocha, MD;  Location: [facility];  Service: Cardiovascular;  Laterality: N/A;   ESOPHAGOGASTRODUODENOSCOPY (EGD) WITH ESOPHAGEAL DILATION     "I've had it stretched 3 times" (10/23/2015)   ESOPHAGOGASTRODUODENOSCOPY (EGD) WITH ESOPHAGEAL DILATION  06/2011   /medical hx above  (09/09/2017)   ESOPHAGOGASTRODUODENOSCOPY (EGD) WITH PROPOFOL N/A 10/22/2014   Procedure: ESOPHAGOGASTRODUODENOSCOPY (EGD) WITH PROPOFOL;  Surgeon: [name], MD;  [facility];  Service: Endoscopy;  Laterality: N/A;   FINGER SURGERY Right 02/1964   "cut little finger off; had it reattached"   I & D EXTREMITY Right 09/10/2017   Procedure: RIGHT FOOT DEBRIDEMENT;  Surgeon: [name], MD;  Location: [facility];  Service: Orthopedics;  Laterality: Right;   INTRAOPERATIVE  TRANSTHORACIC ECHOCARDIOGRAM N/A 04/14/2022   Procedure: INTRAOPERATIVE TRANSTHORACIC ECHOCARDIOGRAM;  Surgeon: Sherren Mocha, MD;  Location: [facility];  Service: Open Heart Surgery;  Laterality: N/A;   MRI of brain  09/2009   chronic microvascular ischemia   OSA Sleep Study     per Dr. 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

## 2022-05-15 NOTE — Progress Notes (Unsigned)
HE[facility] [place]                                     Cardiology Office Note:    Date:  05/18/2022   ID:  [name], DOB Mar 20, 1930, MRN [id number]  PCP:  Tonia Ghent, MD  [facility] Cardiologist:  Sherren Mocha, MD  [facility] Electrophysiologist:  None   Referring MD: Tonia Ghent, MD   Chief Complaint  Patient presents with   Follow-up    1 month s/p TAVR   History of Present Illness:    [name] is a 89 y.o. male with a hx of CAD s/p PCI/RCA 03/11/22, CKD stage IIIb, obesity (BMI 92), OSA on CPAP, HTN, HLD, permanent atrial fibrillation on Coumadin and severe aortic stenosis who presented to [facility] on 04/14/22 for planned TAVR and is being seen today for one month follow up.    [name] is followed by Dr. Burt Knack. He was seen in 08/2021 with stable symptoms of mild exertional dyspnea. At that time an echocardiogram suggested severe paradoxical low-flow low gradient aortic stenosis. In the setting of his advanced age and stable symptoms, we elected to have him return for 34-monthfollow-up with a repeat echocardiogram. His most recent echocardiogram 01/30/22 showed EF 65% and severe AS with mean grad 25 mmHg, peak grad 48.9 mmHg, AVA 0.94 cm2, DVI 0.33, SVI 30 as well as mild MS & TR. The patient also reported significant progression in his symptoms with worsening shortness of breath, chest discomfort, and lightheadedness. [facility] two-vessel CAD with severe stenosis of the proximal RCA, treated with PCI using a 3.0 x 24 mm Synergy DES and severe stenosis of the OM branch of the circumflex with recommendations for medical therapy due to lesion complexity. There was moderate nonobstructive diffuse proximal to mid LAD stenosis estimated at 50%. He was discharged on Plavix and Coumadin. No aspirin given elevated bleeding risk.    He was then evaluated by the multidisciplinary valve team and felt to have  severe, symptomatic aortic stenosis and to be a suitable candidate and is now s/p successful TAVR with a 23 mm Edwards Sapien 3 Ultra Resilia THV via the TF approach on 04/14/22. Post operative echo with stable valve function with a normal EF, mean gradient at 814mg, and AVA at 2.93cm2. He had a new LBBB immediately after TAVR however this regressed to a narrow QRS by discharge. He was continued on Plavix and Coumadin given recent PCI 02/2022.   In TOC follow up he was doing well and continues to do well today with no complaints of SOB, chest pain, palpitations, LE edema, orthopnea, bleeding in stool or urine, dizziness, or syncope.   Past Medical History:  Diagnosis Date   Arthritis    Bradycardia    a. nocturnal with pauses overnight on tele likely due to OSA 10/2015.   CKD (chronic kidney disease), stage III (HCC)    Coronary artery disease    a. prev nonobst. b. LHC 10/24/15: occlusion of small diffusely diseased OM2 s/p balloon angioplasty, mild nonobstructive disease of mLAD and mRCA, normal LVEDP   Diabetes mellitus without complication ([place]   [id number]'s daughter states patient does not  have diabetes.  A1C 07/23/20 was 7.2, patientr had been on medication.   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 CBurt Knack& Dr. WLavonna Monarch  Severe aortic stenosis    Vertigo     Past Surgical History:   Procedure Laterality Date   AMPUTATION Right 03/03/2019   Procedure: RIGHT 2ND TOE AMPUTATION;  Surgeon: DNewt Minion MD;  [place]  Service: Orthopedics;  Laterality: Right;   AMPUTATION Right 01/03/2021   Procedure: RIGHT 3RD TOE AMPUTATION;  Surgeon: DNewt Minion MD;  [place]  Service: Orthopedics;  Laterality: Right;   BALLOON DILATION N/A 10/22/2014   Procedure: BALLOON DILATION;  Surgeon: CGatha Mayer MD;  [facility];  Service: Endoscopy;  Laterality: N/A;   CARDIAC CATHETERIZATION  07/2008   CARDIAC CATHETERIZATION N/A 10/24/2015   Procedure: Left Heart Cath and Coronary Angiography;  Surgeon: CNelva Bush MD;  [place];  Service: Cardiovascular;  Laterality: N/A;   CARDIAC CATHETERIZATION N/A 10/24/2015   Procedure: Coronary Balloon Angioplasty;  Surgeon: CNelva Bush MD;  [place];  Service: Cardiovascular;  Laterality: N/A;   Carotid Dopplers  09/2009   no sig extracranial stenosis and vertebral arteries had antegrade flow   CATARACT EXTRACTION W/ INTRAOCULAR LENS  IMPLANT, BILATERAL Bilateral    CORONARY STENT INTERVENTION N/A 03/11/2022   Procedure: CORONARY STENT INTERVENTION;  Surgeon: CSherren Mocha MD;  [place];  Service: Cardiovascular;  Laterality: N/A;   ESOPHAGOGASTRODUODENOSCOPY (EGD) WITH ESOPHAGEAL DILATION     "I've had it stretched 3 times" (10/23/2015)   ESOPHAGOGASTRODUODENOSCOPY (EGD) WITH ESOPHAGEAL DILATION  06/2011   /medical hx above  (09/09/2017)   ESOPHAGOGASTRODUODENOSCOPY (EGD) WITH PROPOFOL N/A 10/22/2014   Procedure: ESOPHAGOGASTRODUODENOSCOPY (EGD) WITH PROPOFOL;  Surgeon: CGatha Mayer MD;  [facility];  Service: Endoscopy;  Laterality: N/A;   FINGER SURGERY Right 02/1964   "cut little finger off; had it reattached"   I & D EXTREMITY Right 09/10/2017   Procedure: RIGHT FOOT DEBRIDEMENT;  Surgeon: DNewt Minion MD;  [place]   Service: Orthopedics;  Laterality: Right;   INTRAOPERATIVE TRANSTHORACIC ECHOCARDIOGRAM N/A 04/14/2022   Procedure: INTRAOPERATIVE TRANSTHORACIC ECHOCARDIOGRAM;  Surgeon: CSherren Mocha MD;  [place] [facility];  Service: Open Heart Surgery;  Laterality: N/A;   MRI of brain  09/2009   chronic microvascular ischemia   OSA Sleep Study     per Dr. DBrett Fairy  RIGHT/LEFT HEART CATH AND CORONARY ANGIOGRAPHY N/A 03/11/2022   Procedure: RIGHT/LEFT HEART CATH AND CORONARY ANGIOGRAPHY;  Surgeon: CSherren Mocha MD;  [place];  Service: Cardiovascular;  Laterality: N/A;   SHOULDER ARTHROSCOPY W/ ROTATOR CUFF REPAIR Right 2008   TRANSCATHETER AORTIC VALVE REPLACEMENT, TRANSFEMORAL Left 04/14/2022   Procedure: Transcatheter Aortic Valve Replacement, Transfemoral;  Surgeon: CSherren Mocha MD;  [facility] [place];  Service: Open Heart  Surgery;  Laterality: Left;    Current Medications: Current Meds  Medication Sig   acetaminophen (TYLENOL) 500 MG tablet Take 500 mg by mouth as needed for mild pain.   allopurinol (ZYLOPRIM) 100 MG tablet TAKE 1 TABLET BY MOUTH EVERY DAY   azithromycin (ZITHROMAX) 500 MG tablet TAKE 500 MG BY MOUTH  1 HOUR PRIOR TO ANY DENTAL CLEANINGS AND PROCEDURES.   clopidogrel (PLAVIX) 75 MG tablet TAKE 1 TABLET BY MOUTH EVERY DAY   cyanocobalamin (,VITAMIN B-12,) 1000 MCG/ML injection 10105mg IM every 28 days (Patient taking differently: Inject 1,000 mcg into the muscle every 30 (thirty) days.)   diltiazem (CARDIZEM CD) 180 MG 24 hr capsule Take 1 capsule (180 mg total) by mouth daily.   furosemide (LASIX) 20 MG tablet Take 1 tablet (20 mg total) by mouth daily.   lisinopril (ZESTRIL) 20 MG tablet Take 10 mg by mouth in the morning and at bedtime.   metoprolol tartrate (LOPRESSOR) 25 MG tablet Take 0.5 tablets (12.5 mg total) by mouth 2 (two) times daily.   pantoprazole (PROTONIX) 40 MG tablet Take 1 tablet (40 mg total) by mouth daily.    tamsulosin (FLOMAX) 0.4 MG CAPS capsule TAKE 1 CAPSULE BY MOUTH EVERY DAY (Patient taking differently: Take 0.4 mg by mouth daily.)   warfarin (COUMADIN) 4 MG tablet Take 1-1.5 tablets (4-6 mg total) by mouth See admin instructions. 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 Tobacco Use   Smoking status: Former    Packs/day: 0.12    Years: 4.00    Total pack years: 0.48    Types: Cigarettes    Quit date: 03/16/1969    Years since quitting: 53.2   Smokeless tobacco: Former   Tobacco comments:    "chewed when I smoked; smoked 1 pack/week for about 4 years, quit 1971  Vaping Use   Vaping Use: Never used  Substance and Sexual Activity   Alcohol use: Not Currently    Alcohol/week: 0.0 standard drinks of alcohol    Comment: 09/09/2017  "Quit all alcohol in 1970's"   Drug use: Never   Sexual activity: Not on file  Other Topics Concern   Not on file  Social History Narrative   [place], widowed after 614years   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   Daily caffeine    Social Determinants of Health   Financial Resource Strain: Low Risk  (11/27/2021)   Overall Financial Resource Strain (CARDIA)    Difficulty of Paying Living Expenses: Not hard at all  Food Insecurity: No Food Insecurity (04/16/2022)   Hunger Vital Sign    Worried About Running Out of Food in the Last Year: Never true    Ran Out of Food in the  Last Year: Never true  Transportation Needs: No Transportation Needs (11/27/2021)   PRAPARE - THydrologist(Medical): No    Lack of Transportation (  Non-Medical): No  Physical Activity: Insufficiently Active (11/27/2021)   Exercise Vital Sign    Days of Exercise per Week: 7 days    Minutes of Exercise per Session: 20 min  Stress: No Stress Concern Present (11/27/2021)   [place]    Feeling of Stress : Not at all  Social Connections: Moderately Integrated (11/27/2021)   Social Connection and Isolation Panel [NHANES]    Frequency of Communication with Friends and Family: Twice a week    Frequency of Social Gatherings with Friends and Family: Twice a week    Attends Religious Services: 1 to 4 times per year    Active Member of [employer] or Organizations: No    Attends Music therapist: More than 4 times per year    Marital Status: Widowed     Family History: The patient's family history includes Dementia in his father; Heart disease in his brother; Hypertension in his father and another family member; Pneumonia in his father; Sleep apnea in his brother and daughter; Stomach cancer in his mother. There is no history of Prostate cancer or Colon cancer.  ROS:   Please see the history of present illness.    All other systems reviewed and are negative.  EKGs/Labs/Other Studies Reviewed:    The following studies were reviewed today:  Echocardiogram 05/18/22:   1. Left ventricular ejection fraction, by estimation, is 70 to 75%. The left ventricle has hyperdynamic function. The left ventricle has no regional wall motion abnormalities. There is mild left ventricular hypertrophy. Left ventricular diastolic  parameters are indeterminate.  2. Right ventricular systolic function is low normal. The right ventricular size is normal. There is mildly elevated pulmonary artery systolic pressure. The  estimated right ventricular systolic pressure is [id number] mmHg.  3. Left atrial size was mildly dilated.  4. The mitral valve is degenerative. Trivial mitral valve regurgitation. Mild to moderate mitral stenosis. The mean mitral valve gradient is 5.0 mmHg with average heart rate of 68 bpm. MVA 1.2 cm^2 by continuity equation. Severe mitral annular  calcification.  5. The aortic valve has been repaired/replaced. Aortic valve regurgitation is not visualized. There is a 23 mm Edwards Sapien prosthetic (TAVR) valve present in the aortic position. Echo findings are consistent with normal structure and function of the  aortic valve prosthesis. Aortic valve mean gradient measures 7.0 mmHg. Vmax 1.8 cm^2, EOA 1.7 cm^2, DI 0.57  6. Aortic dilatation noted. There is mild dilatation of the ascending aorta, measuring 39 mm.  7. The inferior vena cava is normal in size with greater than 50% respiratory variability, suggesting right atrial pressure of 3 mmHg.   TAVR OPERATIVE NOTE     Date of Procedure:                04/14/2022   Preoperative Diagnosis:      Severe Aortic Stenosis    Postoperative Diagnosis:    Same    Procedure:        Transcatheter Aortic Valve Replacement - Percutaneous Left Transfemoral Approach             Edwards Sapien 3 Ultra THV (size 23 mm, model # 9755RSL, serial # [id number])              Co-Surgeons:                        Coralie Common MD and Sherren Mocha, MD  Anesthesiologist:                  Lahoma Crocker DO   Echocardiographer:              Tish Men MD   Pre-operative Echo Findings: Severe aortic stenosis Normal left ventricular systolic function   Post-operative Echo Findings: no paravalvular leak normal left ventricular systolic function     Echo 04/15/22:   1. Echo finding are consistent with normal structure and function of the  aortic valve prosthesis. The aortic valve has been repaired/replaced.  Aortic valve regurgitation is not visualized. No  aortic stenosis is  present. There is a 23 mm Sapien  prosthetic (TAVR) valve present in the aortic position. Procedure Date:  04/14/2022. Aortic valve area, by VTI measures 2.93 cm. Aortic valve mean  gradient measures 8.0 mmHg. Aortic valve Vmax measures 1.91 m/s.   2. Left ventricular ejection fraction, by estimation, is 70 to 75%. The  left ventricle has hyperdynamic function. The left ventricle has no  regional wall motion abnormalities. There is moderate asymmetric left  ventricular hypertrophy of the basal-septal   segment. Left ventricular diastolic function could not be evaluated.   3. Right ventricular systolic function is normal. The right ventricular  size is normal. There is moderately elevated pulmonary artery systolic  pressure. The estimated right ventricular systolic pressure is [id number] mmHg.   4. Left atrial size was mild to moderately dilated.   5. The mitral valve is degenerative. Trivial mitral valve regurgitation.  Mild mitral stenosis. The mean mitral valve gradient is 5.3 mmHg with  average heart rate of 90 bpm. Severe mitral annular calcification.   6. The inferior vena cava is normal in size with greater than 50%  respiratory variability, suggesting right atrial pressure of 3 mmHg.   EKG:  EKG is not ordered today.    Recent Labs: 04/10/2022: ALT 22 04/15/2022: BUN 17; Creatinine, Ser 1.29; Hemoglobin 15.4; Magnesium 2.2; Platelets 115; Potassium 4.2; Sodium 135  Recent Lipid Panel    Component Value Date/Time   CHOL 166 05/16/2019 0923   TRIG 279.0 (H) 05/16/2019 0923   HDL 26.40 (L) 05/16/2019 0923   CHOLHDL 6 05/16/2019 0923   VLDL 55.8 (H) 05/16/2019 0923   LDLCALC 82 10/24/2015 0430   LDLDIRECT 95.0 05/16/2019 0923   Physical Exam:    VS:  BP 126/70   Pulse 85   Ht '5\' 6"'$  (1.676 m)   Wt 206 lb 12.8 oz (93.8 kg)   SpO2 97%   BMI 33.38 kg/m     Wt Readings from Last 3 Encounters:  05/18/22 206 lb 12.8 oz (93.8 kg)  04/24/22 221 lb 9.6 oz (100.5  kg)  04/14/22 216 lb 14.9 oz (98.4 kg)    General: Well developed, well nourished, NAD Lungs:Clear to ausculation bilaterally. No wheezes, rales, or rhonchi. Breathing is unlabored. Cardiovascular: Irregularly irregular. No murmurs Extremities: No edema.  Neuro: Alert and oriented. No focal deficits. No facial asymmetry. MAE spontaneously. Psych: Responds to questions appropriately with normal affect.    ASSESSMENT/PLAN:    Severe AS: Patient doing well with NYHA class I symptoms s/p successful TAVR with a 23 mm Edwards Sapien 3 Ultra Resilia THV via the TF approach on 04/14/22. Echo today with mean gradient at 58mHg, EOA 1.7 cm2, and DVI at 0.57. Plan per cath note was to continue Plavix and Coumadin for 6 months (end of June 2024) then Coumadin monotherapy? Continue with lifelong dental SBE. Plan follow up  with Dr. Burt Knack then one year office visit with echocardiogram with me.   Mild to moderate MR: Noted on echocardiogram with mean MV gradient at 59mHg (HR 68) and MVA at 1.2cm2 with severe MAC. Asymptomatic. Follow for now. Likely a poor clip candidate with severe calcifications.   [facility] two-vessel CAD with severe stenosis of the proximal RCA, treated with PCI using a 3.0 x 24 mm Synergy DES and severe stenosis of the OM branch of the circumflex with recommendations for medical therapy due to lesion complexity. There was moderate nonobstructive diffuse proximal to mid LAD stenosis estimated at 50%. Continued on Plavix and Coumadin given recent PCI (no aspirin given elevated bleeding risk) until end of June 2024. Denies anginal symptoms.    Permanent atrial fibrillation: Continue AC with Coumadin and AV nodal blocking agents. Follows with [facility].   CKD stage IIIb: Creat baseline in the 1.1 range.    Pulmonary nodule: Pre TAVR CT showed a "solid apical right upper lobe 0.4 cm pulmonary nodule, new since 05/08/2006 chest CT. Although likely benign, if the  patient is high-risk, given the morphology and/or location of this nodule a non-contrast chest CT can be considered in 12 months." Although considered low risk patient wishes to have 12 month CT scan for completion. This has been discussed and ordered today.   Medication Adjustments/Labs and Tests Ordered: Current medicines are reviewed at length with the patient today.  Concerns regarding medicines are outlined above.  Orders Placed This Encounter  Procedures   ECHOCARDIOGRAM COMPLETE   No orders of the defined types were placed in this encounter.   Patient Instructions  Medication Instructions:  Your physician recommends that you continue on your current medications as directed. Please refer to the Current Medication list given to you today.  *If you need a refill on your cardiac medications before your next appointment, please call your pharmacy*   Lab Work: NONE If you have labs (blood work) drawn today and your tests are completely normal, you will receive your results only by: [place](if you have MyChart) OR A paper copy in the mail If you have any lab test that is abnormal or we need to change your treatment, we will call you to review the results.   Testing/Procedures: NONE   Follow-Up: [facility] and your health needs are our priority.  As part of our continuing mission to provide you with exceptional heart care, we have created designated Provider Care Teams.  These Care Teams include your primary Cardiologist (physician) and Advanced Practice Providers (APPs -  Physician Assistants and Nurse Practitioners) who all work together to provide you with the care you need, when you need it.  We recommend signing up for the patient portal called "MyChart".  Sign up information is provided on this After Visit Summary.  MyChart is used to connect with patients for Virtual Visits (Telemedicine).  Patients are able to view lab/test results, encounter notes,  upcoming appointments, etc.  Non-urgent messages can be sent to your provider as well.   To learn more about what you can do with MyChart, go to [email or web address]    Your next appointment:   KEEP SCHEDULED FOLLOW-UP   Signed, JKathyrn Drown NP  05/18/2022 11:25 AM    [place]

## 2022-05-18 ENCOUNTER — Ambulatory Visit (HOSPITAL_COMMUNITY): Payer: Medicare Other | Attending: Cardiology | Admitting: Cardiology

## 2022-05-18 ENCOUNTER — Ambulatory Visit (HOSPITAL_BASED_OUTPATIENT_CLINIC_OR_DEPARTMENT_OTHER): Payer: Medicare Other

## 2022-05-18 VITALS — BP 126/70 | HR 85 | Ht 66.0 in | Wt 206.8 lb

## 2022-05-18 DIAGNOSIS — Z952 Presence of prosthetic heart valve: Secondary | ICD-10-CM | POA: Diagnosis not present

## 2022-05-18 DIAGNOSIS — Z955 Presence of coronary angioplasty implant and graft: Secondary | ICD-10-CM

## 2022-05-18 DIAGNOSIS — N1832 Chronic kidney disease, stage 3b: Secondary | ICD-10-CM

## 2022-05-18 DIAGNOSIS — R911 Solitary pulmonary nodule: Secondary | ICD-10-CM

## 2022-05-18 DIAGNOSIS — I482 Chronic atrial fibrillation, unspecified: Secondary | ICD-10-CM

## 2022-05-18 DIAGNOSIS — I350 Nonrheumatic aortic (valve) stenosis: Secondary | ICD-10-CM

## 2022-05-18 DIAGNOSIS — Z7901 Long term (current) use of anticoagulants: Secondary | ICD-10-CM | POA: Diagnosis not present

## 2022-05-18 LAB — ECHOCARDIOGRAM COMPLETE
AR max vel: 1.77 cm2
AV Area VTI: 1.91 cm2
AV Area mean vel: 1.75 cm2
AV Mean grad: 7 mmHg
AV Peak grad: 11.8 mmHg
Ao pk vel: 1.72 m/s
Area-P 1/2: 2.75 cm2
MV VTI: 1.2 cm2
S' Lateral: 2.1 cm

## 2022-05-18 NOTE — Patient Instructions (Signed)
Medication Instructions:  Your physician recommends that you continue on your current medications as directed. Please refer to the Current Medication list given to you today.  *If you need a refill on your cardiac medications before your next appointment, please call your pharmacy*   Lab Work: NONE If you have labs (blood work) drawn today and your tests are completely normal, you will receive your results only by: [place] (if you have MyChart) OR A paper copy in the mail If you have any lab test that is abnormal or we need to change your treatment, we will call you to review the results.   Testing/Procedures: NONE   Follow-Up: At [facility], you and your health needs are our priority.  As part of our continuing mission to provide you with exceptional heart care, we have created designated Provider Care Teams.  These Care Teams include your primary Cardiologist (physician) and Advanced Practice Providers (APPs -  Physician Assistants and Nurse Practitioners) who all work together to provide you with the care you need, when you need it.  We recommend signing up for the patient portal called "MyChart".  Sign up information is provided on this After Visit Summary.  MyChart is used to connect with patients for Virtual Visits (Telemedicine).  Patients are able to view lab/test results, encounter notes, upcoming appointments, etc.  Non-urgent messages can be sent to your provider as well.   To learn more about what you can do with MyChart, go to [email or web address].    Your next appointment:   KEEP SCHEDULED FOLLOW-UP

## 2022-05-25 ENCOUNTER — Other Ambulatory Visit: Payer: Self-pay | Admitting: Cardiovascular Disease

## 2022-05-25 DIAGNOSIS — I482 Chronic atrial fibrillation, unspecified: Secondary | ICD-10-CM

## 2022-05-25 NOTE — Telephone Encounter (Signed)
Prescription refill request received for warfarin Lov:  Mcdaniel 05/18/2022 Next INR check: 3/12 Warfarin tablet strength: '4mg'$ 

## 2022-05-26 ENCOUNTER — Ambulatory Visit: Payer: Medicare Other | Attending: Cardiovascular Disease

## 2022-05-26 DIAGNOSIS — Z952 Presence of prosthetic heart valve: Secondary | ICD-10-CM | POA: Diagnosis not present

## 2022-05-26 DIAGNOSIS — I482 Chronic atrial fibrillation, unspecified: Secondary | ICD-10-CM | POA: Insufficient documentation

## 2022-05-26 LAB — POCT INR: INR: 1.9 — AB (ref 2.0–3.0)

## 2022-05-26 NOTE — Patient Instructions (Signed)
Description   Take an extra 1/2 tablet today, then resume same dosage of warfarin 1 tablet daily except for 1.5 tablets on Sundays and Thursdays. INR in 4 weeks. [phone].

## 2022-05-28 ENCOUNTER — Ambulatory Visit: Payer: Medicare Other

## 2022-06-01 ENCOUNTER — Encounter: Payer: Self-pay | Admitting: Orthopedic Surgery

## 2022-06-01 ENCOUNTER — Ambulatory Visit (INDEPENDENT_AMBULATORY_CARE_PROVIDER_SITE_OTHER): Payer: Medicare Other | Admitting: Orthopedic Surgery

## 2022-06-01 DIAGNOSIS — M6701 Short Achilles tendon (acquired), right ankle: Secondary | ICD-10-CM | POA: Diagnosis not present

## 2022-06-01 DIAGNOSIS — L97511 Non-pressure chronic ulcer of other part of right foot limited to breakdown of skin: Secondary | ICD-10-CM

## 2022-06-01 NOTE — Progress Notes (Signed)
Office Visit Note   Patient: [name]           Date of Birth: Jul 30, 1930           MRN: [id number] Visit Date: 06/01/2022              Requested by: Tonia Ghent, MD [address] [place],   [address] PCP: Tonia Ghent, MD  Chief Complaint  Patient presents with   Right Foot - Follow-up      HPI: Patient is an 89 year old gentleman who presents in follow-up for [facility] grade 1 ulcer right foot fourth metatarsal head he is currently full weightbearing with a cane and sneakers.  Assessment & Plan: Visit Diagnoses:  1. Right foot ulcer, limited to breakdown of skin ([place])   2. Achilles tendon contracture, right     Plan: The ulcer was debrided a felt relieving donut was placed underneath his orthotic.  Follow-Up Instructions: Return in about 4 weeks (around 06/29/2022).   Ortho Exam  Patient is alert, oriented, no adenopathy, well-dressed, normal affect, normal respiratory effort. Examination the ulcer is smaller.  There is no cellulitis no odor or drainage.  After informed consent a 10 blade knife was used debride the skin and soft tissue back to healthy viable granulation tissue.  This was touched with silver nitrate.  After debridement the ulcer is 1 cm in diameter and 2 mm deep.  Imaging: No results found. No images are attached to the encounter.  Labs: Lab Results  Component Value Date   HGBA1C 6.8 (H) 05/16/2021   HGBA1C 7.2 (H) 07/23/2020   HGBA1C 7.7 (H) 09/11/2019   ESRSEDRATE 17 (H) 09/09/2017   CRP 1.1 (H) 09/09/2017   LABURIC 7.1 05/16/2021   LABURIC 7.6 12/16/2020   LABURIC 7.5 07/23/2020   REPTSTATUS 09/19/2017 FINAL 09/14/2017   GRAMSTAIN  09/10/2017    MODERATE WBC PRESENT,BOTH PMN AND MONONUCLEAR NO ORGANISMS SEEN    CULT  09/14/2017    NO GROWTH 5 DAYS Performed at [facility], [place] [address] [place],  [address]      Lab Results  Component Value Date   ALBUMIN 4.1 04/10/2022   ALBUMIN 4.0  09/17/2020   ALBUMIN 4.2 07/23/2020    Lab Results  Component Value Date   MG 2.2 04/15/2022   MG 2.2 09/09/2017   No results found for: "VD25OH"  No results found for: "PREALBUMIN"    Latest Ref Rng & Units 04/15/2022    2:40 AM 04/14/2022    3:02 PM 04/10/2022    9:30 AM  CBC EXTENDED  WBC 4.0 - 10.5 K/uL 7.7   6.3   RBC 4.22 - 5.81 MIL/uL 5.17   5.65   Hemoglobin 13.0 - 17.0 g/dL 15.4  15.0  16.5   HCT 39.0 - 52.0 % 44.6  44.0  49.3   Platelets 150 - 400 K/uL 115   125      There is no height or weight on file to calculate BMI.  Orders:  No orders of the defined types were placed in this encounter.  No orders of the defined types were placed in this encounter.    Procedures: No procedures performed  Clinical Data: No additional findings.  ROS:  All other systems negative, except as noted in the HPI. Review of Systems  Objective: Vital Signs: There were no vitals taken for this visit.  Specialty Comments:  No specialty comments available.  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   Severe aortic stenosis 03/11/2022   Sinusitis 07/27/2021   PND (post-nasal drip) 07/17/2021   Obstructive sleep apnea treated with bilevel positive airway pressure (BiPAP) 03/19/2021   Lower urinary tract symptoms (LUTS) 09/16/2020   Gouty arthritis of left foot 06/20/2020   Diabetic peripheral neuropathy ([place]) 10/04/2019   B12 deficiency 05/17/2019   Idiopathic chronic gout, unspecified site, without tophus (tophi) 10/06/2017   CAD S/P percutaneous coronary angioplasty    Chronic atrial fibrillation ([place])    CKD (chronic kidney disease), stage III ([place])    Hypertension    Hyperlipidemia    ETD (eustachian tube dysfunction) 05/24/2015   Chronic anticoagulation 09/25/2014   Esophageal stricture from GERD 06/08/2011   OSA on CPAP 09/22/2010   Gout 08/15/2008   OBESITY 08/15/2008    Essential hypertension 08/15/2008   Coronary artery disease with exertional angina ([place]) 08/15/2008   GASTROESOPHAGEAL REFLUX DISEASE 08/15/2008   Past Medical History:  Diagnosis Date   Arthritis    Bradycardia    a. nocturnal with pauses overnight on tele likely due to OSA 10/2015.   CKD (chronic kidney disease), stage III ([facility])    Coronary artery disease    a. prev nonobst. b. LHC 10/24/15: occlusion of small diffusely diseased OM2 s/p balloon angioplasty, mild nonobstructive disease of mLAD and mRCA, normal LVEDP   Diabetes mellitus without complication ([place])    [id number] Pt's daughter states patient does not  have diabetes.  A1C 07/23/20 was 7.2, patientr had been on medication.   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 Burt Knack & Dr. Lavonna Monarch   Severe aortic stenosis    Vertigo     Family History  Problem Relation Age of Onset   Stomach cancer Mother        died in 63's    Hypertension Father        died in his 17's pna likely dementia   Dementia Father    Pneumonia Father    Heart disease Brother        S/P CABG   Sleep apnea Brother    Sleep apnea Daughter    Hypertension Other    Prostate cancer Neg Hx    Colon cancer Neg Hx     Past Surgical History:  Procedure Laterality Date   AMPUTATION Right 03/03/2019   Procedure: RIGHT 2ND TOE AMPUTATION;  Surgeon: [name], MD;   Location: [facility];  Service: Orthopedics;  Laterality: Right;   AMPUTATION Right 01/03/2021   Procedure: RIGHT 3RD TOE AMPUTATION;  Surgeon: [name], MD;  Location: [facility];  Service: Orthopedics;  Laterality: Right;   BALLOON DILATION N/A 10/22/2014   Procedure: BALLOON DILATION;  Surgeon: [name], MD;  Location: [facility];  Service: Endoscopy;  Laterality: N/A;   CARDIAC CATHETERIZATION  07/2008   CARDIAC CATHETERIZATION N/A 10/24/2015   Procedure: Left Heart Cath and Coronary Angiography;  Surgeon: [name], MD;  Location: [facility];  Service: Cardiovascular;  Laterality: N/A;   CARDIAC CATHETERIZATION N/A 10/24/2015   Procedure: Coronary Balloon Angioplasty;  Surgeon: [name], MD;  Location: [facility];  Service: Cardiovascular;  Laterality: N/A;   Carotid Dopplers  09/2009   no sig extracranial stenosis and vertebral arteries had antegrade flow   CATARACT EXTRACTION W/ INTRAOCULAR LENS  IMPLANT, BILATERAL Bilateral    CORONARY STENT INTERVENTION N/A 03/11/2022   Procedure: CORONARY STENT INTERVENTION;  Surgeon: Sherren Mocha, MD;  Location: [facility];  Service: Cardiovascular;  Laterality: N/A;   ESOPHAGOGASTRODUODENOSCOPY (EGD) WITH ESOPHAGEAL DILATION     "I've had it stretched 3 times" (10/23/2015)   ESOPHAGOGASTRODUODENOSCOPY (EGD) WITH ESOPHAGEAL DILATION  06/2011   /medical hx above  (09/09/2017)   ESOPHAGOGASTRODUODENOSCOPY (EGD) WITH PROPOFOL N/A 10/22/2014   Procedure: ESOPHAGOGASTRODUODENOSCOPY (EGD) WITH PROPOFOL;  Surgeon: [name], MD;  Location: [facility];  Service: Endoscopy;  Laterality: N/A;   FINGER SURGERY Right 02/1964   "cut little finger off; had it reattached"   I & D EXTREMITY Right 09/10/2017   Procedure: RIGHT FOOT DEBRIDEMENT;  Surgeon: [name], MD;  Location: [facility];  Service: Orthopedics;  Laterality: Right;   INTRAOPERATIVE TRANSTHORACIC ECHOCARDIOGRAM N/A 04/14/2022   Procedure: INTRAOPERATIVE  TRANSTHORACIC ECHOCARDIOGRAM;  Surgeon: Sherren Mocha, MD;  Location: [facility];  Service: Open Heart Surgery;  Laterality: N/A;   MRI of brain  09/2009   chronic microvascular ischemia   OSA Sleep Study     per Dr. 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

## 2022-06-02 ENCOUNTER — Ambulatory Visit (INDEPENDENT_AMBULATORY_CARE_PROVIDER_SITE_OTHER): Payer: Medicare Other | Admitting: *Deleted

## 2022-06-02 DIAGNOSIS — E538 Deficiency of other specified B group vitamins: Secondary | ICD-10-CM

## 2022-06-02 MED ORDER — CYANOCOBALAMIN 1000 MCG/ML IJ SOLN
1000.0000 ug | Freq: Once | INTRAMUSCULAR | Status: AC
  Administered 2022-06-02: 1000 ug via INTRAMUSCULAR

## 2022-06-02 NOTE — Progress Notes (Signed)
Per orders of Dr. Duncan, injection of Vitamin B 12  given by Shawndell Varas LPN Patient tolerated injection well.  

## 2022-06-03 ENCOUNTER — Telehealth (HOSPITAL_COMMUNITY): Payer: Self-pay | Admitting: *Deleted

## 2022-06-03 ENCOUNTER — Telehealth: Payer: Self-pay | Admitting: Family Medicine

## 2022-06-03 NOTE — Telephone Encounter (Signed)
Did you mean for this visit to be scheduled as a physical? Looks like patient had a physical/AWV on 03/30/22 in our office.

## 2022-06-03 NOTE — Telephone Encounter (Signed)
Please schedule a yearly visit when possible.  We can do labs at the visit.  Thanks.

## 2022-06-03 NOTE — Telephone Encounter (Signed)
Spoke to pt's daughter, Cassandria Santee, scheduled cpe for 09/10/22, due to her work schedule.

## 2022-06-03 NOTE — Telephone Encounter (Signed)
It looks like that appointment in 03/2022 was cancelled.  Please schedule when possible.  Thanks.

## 2022-06-03 NOTE — Telephone Encounter (Signed)
-----   Message from [name], MD sent at 06/02/2022  4:36 PM EDT ----- Regarding: RE: Ok to participate in group exercise in Cardiac Rehab Yes, sounds perfect to proceed with cardiac rehab.  No restrictions. ----- Message ----- From: Rowe Pavy, RN Sent: 06/02/2022   2:48 PM EDT To: [name], MD Subject: Ok to participate in group exercise in Cardi#   Dr. Sharol Given,  The above patient seen by you in follow up for Right foot ulcer is eligible to participate in Cardiac Rehab s/p 12/27 DES x 1 to RCA and 1/31 TAVR.  I see in your progress note, pt is full weightbearing able to wear sneakers/closed toe shoe and cane.  Ok for pt to proceed with exercise?  Any activities he should avoid?  Thank you for your valued input Cherre Huger, BSN Cardiac and Pulmonary Rehab Nurse Navigator

## 2022-06-10 ENCOUNTER — Telehealth (HOSPITAL_COMMUNITY): Payer: Self-pay

## 2022-06-10 ENCOUNTER — Encounter (HOSPITAL_COMMUNITY): Payer: Self-pay

## 2022-06-10 NOTE — Telephone Encounter (Signed)
Attempted to call patient in regards to Cardiac Rehab - LM on VM Mailed letter 

## 2022-06-17 ENCOUNTER — Other Ambulatory Visit: Payer: Self-pay

## 2022-06-17 ENCOUNTER — Emergency Department (HOSPITAL_COMMUNITY): Payer: Medicare Other

## 2022-06-17 ENCOUNTER — Telehealth: Payer: Self-pay | Admitting: Physician Assistant

## 2022-06-17 ENCOUNTER — Encounter (HOSPITAL_COMMUNITY): Payer: Self-pay | Admitting: Emergency Medicine

## 2022-06-17 ENCOUNTER — Emergency Department (HOSPITAL_COMMUNITY)
Admission: EM | Admit: 2022-06-17 | Discharge: 2022-06-18 | Disposition: A | Discharge status: Home / Self Care | Payer: Medicare Other | Attending: Emergency Medicine | Admitting: Emergency Medicine

## 2022-06-17 DIAGNOSIS — Z7901 Long term (current) use of anticoagulants: Secondary | ICD-10-CM | POA: Insufficient documentation

## 2022-06-17 DIAGNOSIS — R222 Localized swelling, mass and lump, trunk: Secondary | ICD-10-CM | POA: Diagnosis not present

## 2022-06-17 DIAGNOSIS — R600 Localized edema: Secondary | ICD-10-CM | POA: Diagnosis not present

## 2022-06-17 DIAGNOSIS — M7989 Other specified soft tissue disorders: Secondary | ICD-10-CM | POA: Diagnosis not present

## 2022-06-17 LAB — CBC
HCT: 41.6 % (ref 39.0–52.0)
Hemoglobin: 13.8 g/dL (ref 13.0–17.0)
MCH: 28.7 pg (ref 26.0–34.0)
MCHC: 33.2 g/dL (ref 30.0–36.0)
MCV: 86.5 fL (ref 80.0–100.0)
Platelets: 173 10*3/uL (ref 150–400)
RBC: 4.81 MIL/uL (ref 4.22–5.81)
RDW: 14.2 % (ref 11.5–15.5)
WBC: 8.5 10*3/uL (ref 4.0–10.5)
nRBC: 0 % (ref 0.0–0.2)

## 2022-06-17 LAB — BASIC METABOLIC PANEL
Anion gap: 8 (ref 5–15)
BUN: 27 mg/dL — ABNORMAL HIGH (ref 8–23)
CO2: 23 mmol/L (ref 22–32)
Calcium: 8.6 mg/dL — ABNORMAL LOW (ref 8.9–10.3)
Chloride: 102 mmol/L (ref 98–111)
Creatinine, Ser: 1.75 mg/dL — ABNORMAL HIGH (ref 0.61–1.24)
GFR, Estimated: 37 mL/min — ABNORMAL LOW (ref >60)
Glucose, Bld: 146 mg/dL — ABNORMAL HIGH (ref 70–99)
Potassium: 4.2 mmol/L (ref 3.5–5.1)
Sodium: 133 mmol/L — ABNORMAL LOW (ref 135–145)

## 2022-06-17 LAB — BRAIN NATRIURETIC PEPTIDE: B Natriuretic Peptide: 216 pg/mL — ABNORMAL HIGH (ref 0.0–100.0)

## 2022-06-17 LAB — PROTIME-INR
INR: 1.8 — ABNORMAL HIGH (ref 0.8–1.2)
Prothrombin Time: 20.9 seconds — ABNORMAL HIGH (ref 11.4–15.2)

## 2022-06-17 LAB — TROPONIN I (HIGH SENSITIVITY): Troponin I (High Sensitivity): 6 ng/L (ref <18)

## 2022-06-17 NOTE — Telephone Encounter (Signed)
   The patient's daughter called the answering service after-hours today. She is very concerned that he has a lot of new swelling in his right lower leg, temp 99.9 earlier with subsequent recheck that is normal. She has tried to convince him to go to ER but he won't go. I relayed that unfortunately this issue cannot be evaluated over the phone as there could be infection or blood clotting/internal bleeding into the leg tissue and I agree with her thought on proceeding to ER. She may be able to convince him to go to [facility] instead. They will talk it over and notify our office in the morning if he ultimately elects not to proceed. Will send to Dr. Burt Knack as Juluis Rainier.   Charlie Pitter, PA-C

## 2022-06-17 NOTE — ED Provider Notes (Signed)
[facility] Provider Note   CSN: [id number] Arrival date & time: 06/17/22  2041     History {Add pertinent medical, surgical, social history, OB history to HPI:1} Chief Complaint  Patient presents with   Leg Swelling    [name] is a 89 y.o. male.  Patient presents to the emergency department for evaluation of swelling of the right leg.  She reports painless swelling of the lower portion of the leg, ankle and foot.  Symptoms intermittent for days but worse today.  He denies injury.  No chest pain or shortness of breath.       Home Medications Prior to Admission medications   Medication Sig Start Date End Date Taking? Authorizing Provider  acetaminophen (TYLENOL) 500 MG tablet Take 500 mg by mouth as needed for mild pain.    [provider]  allopurinol (ZYLOPRIM) 100 MG tablet TAKE 1 TABLET BY MOUTH EVERY DAY 12/22/21   Tonia Ghent, MD  azithromycin (ZITHROMAX) 500 MG tablet TAKE 500 MG BY MOUTH  1 HOUR PRIOR TO ANY DENTAL CLEANINGS AND PROCEDURES. 04/24/22   Kathyrn Drown D, NP  clopidogrel (PLAVIX) 75 MG tablet TAKE 1 TABLET BY MOUTH EVERY DAY 04/28/22   Sherren Mocha, MD  cyanocobalamin (,VITAMIN B-12,) 1000 MCG/ML injection 1016mcg IM every 28 days Patient taking differently: Inject 1,000 mcg into the muscle every 30 (thirty) days. 09/13/20   Tonia Ghent, MD  diltiazem (CARDIZEM CD) 180 MG 24 hr capsule Take 1 capsule (180 mg total) by mouth daily. 10/21/21   Sherren Mocha, MD  furosemide (LASIX) 20 MG tablet Take 1 tablet (20 mg total) by mouth daily. 10/21/21   Sherren Mocha, MD  lisinopril (ZESTRIL) 20 MG tablet Take 10 mg by mouth in the morning and at bedtime.    [provider]  metoprolol tartrate (LOPRESSOR) 25 MG tablet Take 0.5 tablets (12.5 mg total) by mouth 2 (two) times daily. 09/17/21   Sherren Mocha, MD  pantoprazole (PROTONIX) 40 MG tablet Take 1 tablet (40 mg total) by mouth daily.  04/28/22 04/28/23  Kathyrn Drown D, NP  tamsulosin (FLOMAX) 0.4 MG CAPS capsule TAKE 1 CAPSULE BY MOUTH EVERY DAY Patient taking differently: Take 0.4 mg by mouth daily. 09/17/21   Tonia Ghent, MD  warfarin (COUMADIN) 4 MG tablet TAKE 1 TO 1 AND 1/2 TABLETS BY MOUTH DAILY OR AS DIRECTED BY COUMADIN CLINIC 05/25/22   Sherren Mocha, MD      Allergies    Penicillins, Atorvastatin, Pravastatin, Atrovent nasal spray [ipratropium], Colchicine, Hydrocodone, Nsaids, Uloric [febuxostat], Vancomycin, Clarithromycin, and Doxycycline    Review of Systems   Review of Systems  Physical Exam Updated Vital Signs BP 126/70 (BP Location: Right Arm)   Pulse 86   Temp 98.8 F (37.1 C) (Oral)   Resp 20   Ht 5\' 6"  (1.676 m)   Wt 95.7 kg   SpO2 96%   BMI 34.06 kg/m  Physical Exam Vitals and nursing note reviewed.  Constitutional:      General: He is not in acute distress.    Appearance: He is well-developed.  HENT:     Head: Normocephalic and atraumatic.     Mouth/Throat:     Mouth: Mucous membranes are moist.  Eyes:     General: Vision grossly intact. Gaze aligned appropriately.     Extraocular Movements: Extraocular movements intact.     Conjunctiva/sclera: Conjunctivae normal.  Cardiovascular:     Rate and  Rhythm: Normal rate and regular rhythm.     Pulses:          Dorsalis pedis pulses are 1+ on the right side and 1+ on the left side.     Heart sounds: Normal heart sounds, S1 normal and S2 normal. No murmur heard.    No friction rub. No gallop.  Pulmonary:     Effort: Pulmonary effort is normal. No respiratory distress.     Breath sounds: Normal breath sounds.  Abdominal:     Palpations: Abdomen is soft.     Tenderness: There is no abdominal tenderness. There is no guarding or rebound.     Hernia: No hernia is present.  Musculoskeletal:        General: No swelling.     Cervical back: Full passive range of motion without pain, normal range of motion and neck supple. No pain with  movement, spinous process tenderness or muscular tenderness. Normal range of motion.     Right lower leg: Edema present.     Left lower leg: No edema.  Skin:    General: Skin is warm and dry.     Capillary Refill: Capillary refill takes less than 2 seconds.     Findings: No ecchymosis, erythema, lesion or wound.  Neurological:     Mental Status: He is alert and oriented to person, place, and time.     GCS: GCS eye subscore is 4. GCS verbal subscore is 5. GCS motor subscore is 6.     Cranial Nerves: Cranial nerves 2-12 are intact.     Sensory: Sensation is intact.     Motor: Motor function is intact. No weakness or abnormal muscle tone.     Coordination: Coordination is intact.  Psychiatric:        Mood and Affect: Mood normal.        Speech: Speech normal.        Behavior: Behavior normal.     ED Results / Procedures / Treatments   Labs (all labs ordered are listed, but only abnormal results are displayed) Labs Reviewed  BASIC METABOLIC PANEL - Abnormal; Notable for the following components:      Result Value   Sodium 133 (*)    Glucose, Bld 146 (*)    BUN 27 (*)    Creatinine, Ser 1.75 (*)    Calcium 8.6 (*)    GFR, Estimated 37 (*)    All other components within normal limits  PROTIME-INR - Abnormal; Notable for the following components:   Prothrombin Time 20.9 (*)    INR 1.8 (*)    All other components within normal limits  CBC  BRAIN NATRIURETIC PEPTIDE  TROPONIN I (HIGH SENSITIVITY)    EKG None  Radiology No results found.  Procedures Procedures  {Document cardiac monitor, telemetry assessment procedure when appropriate:1}  Medications Ordered in ED Medications - No data to display  ED Course/ Medical Decision Making/ A&P   {   Click here for ABCD2, HEART and other calculatorsREFRESH Note before signing :1}                          Medical Decision Making Amount and/or Complexity of Data Reviewed Labs: ordered. Radiology:  ordered.   ***  {Document critical care time when appropriate:1} {Document review of labs and clinical decision tools ie heart score, Chads2Vasc2 etc:1}  {Document your independent review of radiology images, and any outside records:1} {Document your discussion with family  members, caretakers, and with consultants:1} {Document social determinants of health affecting pt's care:1} {Document your decision making why or why not admission, treatments were needed:1} Final Clinical Impression(s) / ED Diagnoses Final diagnoses:  None    Rx / DC Orders ED Discharge Orders     None

## 2022-06-17 NOTE — ED Triage Notes (Signed)
Pt reports right leg and foot swelling intermittent x 1 week, reports he contacted his cardiologist and was told to come to the ED

## 2022-06-17 NOTE — ED Provider Triage Note (Signed)
Emergency Medicine Provider Triage Evaluation Note  [name] , a 89 y.o. male  was evaluated in triage.  Pt complains of intermittent RLE swelling for the past week, but worsening and more consistent with swelling today. Daughter reports he has been more SOB the past few days, but the patient denies any chest pain of SOB. Aortic valve replaced ned of January. On Plavix and Warfarin. Compliant with medication.  Review of Systems  Positive:  Negative:   Physical Exam  BP 126/70 (BP Location: Right Arm)   Pulse 86   Temp 98.8 F (37.1 C) (Oral)   Resp 20   Ht 5\' 6"  (1.676 m)   Wt 95.7 kg   SpO2 96%   BMI 34.06 kg/m  Gen:   Awake, no distress   Resp:  Normal effort  MSK:   Moves extremities without difficulty  Other:  Dopplerable pulses marked. Some increase in swelling and increase in warmth to the leg. 1+ edema. Compartments are soft. Callous at the bottom of foot, well appearing.   Medical Decision Making  Medically screening exam initiated at 10:44 PM.  Appropriate orders placed.  [name] was informed that the remainder of the evaluation will be completed by another provider, this initial triage assessment does not replace that evaluation, and the importance of remaining in the ED until their evaluation is complete.  Labs pending. [place] not available at this time.    Sherrell Puller, [place] 06/17/22 2247

## 2022-06-18 ENCOUNTER — Ambulatory Visit (HOSPITAL_COMMUNITY)
Admission: RE | Admit: 2022-06-18 | Discharge: 2022-06-18 | Disposition: A | Discharge status: Home / Self Care | Payer: Medicare Other | Source: Ambulatory Visit | Attending: Emergency Medicine | Admitting: Emergency Medicine

## 2022-06-18 ENCOUNTER — Telehealth: Payer: Self-pay | Admitting: Cardiovascular Disease

## 2022-06-18 DIAGNOSIS — M7989 Other specified soft tissue disorders: Secondary | ICD-10-CM | POA: Diagnosis not present

## 2022-06-18 DIAGNOSIS — R600 Localized edema: Secondary | ICD-10-CM | POA: Diagnosis not present

## 2022-06-18 DIAGNOSIS — M79604 Pain in right leg: Secondary | ICD-10-CM | POA: Diagnosis not present

## 2022-06-18 LAB — TROPONIN I (HIGH SENSITIVITY): Troponin I (High Sensitivity): 6 ng/L (ref <18)

## 2022-06-18 NOTE — Telephone Encounter (Signed)
Returned call to daughter Izora Gala who states that she has noticed her dad "huffing and puffing" with little-to-no effort. She states yesterday, just him helping put a tablecloth on the kitchen table, he was huffing. She states walking from one end of the house to the other causes the same issue. Per ED notes yesterday (06/17/22): Patient presents with swelling of the right leg.  Had aortic valve replacement (TAVR) in January.  He also has chronic atrial fibrillation and is on Coumadin.  Reviewing his INRs, however, over the last couple of months he has been somewhat subtherapeutic.  INR today is 1.8.  DVT is therefore considered possible.  Patient does have an ulcer on the plantar aspect of his right foot.  No drainage or erythema.  There is no erythema, warmth of the leg, presentation not consistent with cellulitis.  Patient has palpable pulses and normal capillary refill, no evidence of ischemic limb.   Daughter thinks that he has been breathing heavy but he denies shortness of breath.  No hypoxia here.  Chest x-ray without evidence of pulmonary edema.  BNP in the 200 range.  Doubt CHF with unilateral swelling and remainder of the workup without edema.  Must rule out DVT.  Ultrasound not available at night, will discharge and bring him back in the morning for venous duplex.  INR 1.8, not really a candidate for additional anticoagulation at this time pending DVT study.  Advised daughter Izora Gala that full workup was clear, including chest xray. Pt has appt scheduled with Dr Burt Knack here on 06/25/22 (one week from today) and advised to call back if anything worsens or they have further concerns, but ED cleared for any acute emergency. Might possibly need to get ECHO done to assess TAVR valve functionality to explain the heavy breathing. Does state that swelling initially presented bilaterally, but as of now, is almost entirely R-sided. Educated on compression socks and elevation. Will route to structural team as FYI.

## 2022-06-18 NOTE — Telephone Encounter (Signed)
New Message:    Daughter says patient is in the ER at [facility] She said sh needs to talk to Dr ONEOK nurse. She needs to tell the nurse what is going on and let her decide if patient needs to see Dr Burt Knack. Patient had his transplant in February and they are very concerned.

## 2022-06-18 NOTE — ED Provider Notes (Signed)
Patient returns for his ultrasound of his right lower extremity, it is negative for DVT.  He did have some concerns about yesterday's blood test results, along with daughter at bedside.  We reviewed his lab results, specifically his elevated creatinine level, given an elevated BUN, suggesting patient has at least some mild dehydration, he was encouraged to increase his fluid intake.  However he is also on Lasix and he was advised that he should follow-up with his primary doctor as well, he may need adjustment of his Lasix which can also be affecting this number.  He will call for an office follow-up with his PCP this week.   Burgess Amor, PA-C 06/18/22 1318    Vanetta Mulders, MD 06/21/22 1843

## 2022-06-18 NOTE — Telephone Encounter (Signed)
Daughter wants a call back directly from [place].

## 2022-06-23 ENCOUNTER — Ambulatory Visit: Payer: Medicare Other | Attending: Cardiovascular Disease | Admitting: *Deleted

## 2022-06-23 ENCOUNTER — Telehealth: Payer: Self-pay

## 2022-06-23 DIAGNOSIS — Z952 Presence of prosthetic heart valve: Secondary | ICD-10-CM | POA: Diagnosis not present

## 2022-06-23 DIAGNOSIS — Z5181 Encounter for therapeutic drug level monitoring: Secondary | ICD-10-CM | POA: Diagnosis not present

## 2022-06-23 DIAGNOSIS — N183 Chronic kidney disease, stage 3 unspecified: Secondary | ICD-10-CM | POA: Diagnosis not present

## 2022-06-23 DIAGNOSIS — I482 Chronic atrial fibrillation, unspecified: Secondary | ICD-10-CM | POA: Diagnosis not present

## 2022-06-23 LAB — POCT INR: INR: 3.1 — AB (ref 2.0–3.0)

## 2022-06-23 NOTE — Telephone Encounter (Signed)
     Patient  visit on 06/18/2022  at [facility] was for leg swelling.  Have you been able to follow up with your primary care physician? Yes  The patient was or was not able to obtain any needed medicine or equipment. No medication prescribed.  Are there diet recommendations that you are having difficulty following? No  Patient expresses understanding of discharge instructions and education provided has no other needs at this time. Yes   Prerna Harold [facility]  [facility] Population Health Community Resource Care Guide   [email or web address]  ?? [phone]   Website: [email or web address]  [email or web address]

## 2022-06-23 NOTE — Patient Instructions (Addendum)
Description   Today take 1/2 tablet of warfarin then resume same dosage of warfarin 1 tablet daily except for 1.5 tablets on Sundays and Thursdays. INR in 4 weeks. Call if any new medications or chnges [phone].

## 2022-06-24 DIAGNOSIS — Z952 Presence of prosthetic heart valve: Secondary | ICD-10-CM | POA: Diagnosis not present

## 2022-06-24 DIAGNOSIS — N2581 Secondary hyperparathyroidism of renal origin: Secondary | ICD-10-CM | POA: Diagnosis not present

## 2022-06-24 DIAGNOSIS — D631 Anemia in chronic kidney disease: Secondary | ICD-10-CM | POA: Diagnosis not present

## 2022-06-24 DIAGNOSIS — M1A00X0 Idiopathic chronic gout, unspecified site, without tophus (tophi): Secondary | ICD-10-CM | POA: Diagnosis not present

## 2022-06-24 DIAGNOSIS — N189 Chronic kidney disease, unspecified: Secondary | ICD-10-CM | POA: Diagnosis not present

## 2022-06-24 DIAGNOSIS — I480 Paroxysmal atrial fibrillation: Secondary | ICD-10-CM | POA: Diagnosis not present

## 2022-06-24 DIAGNOSIS — N183 Chronic kidney disease, stage 3 unspecified: Secondary | ICD-10-CM | POA: Diagnosis not present

## 2022-06-25 ENCOUNTER — Ambulatory Visit (INDEPENDENT_AMBULATORY_CARE_PROVIDER_SITE_OTHER): Payer: Medicare Other | Admitting: Orthopedic Surgery

## 2022-06-25 ENCOUNTER — Ambulatory Visit: Payer: Medicare Other | Attending: Cardiovascular Disease | Admitting: Cardiovascular Disease

## 2022-06-25 ENCOUNTER — Encounter: Payer: Self-pay | Admitting: Cardiovascular Disease

## 2022-06-25 VITALS — BP 120/70 | HR 89 | Ht 65.5 in | Wt 209.2 lb

## 2022-06-25 DIAGNOSIS — I872 Venous insufficiency (chronic) (peripheral): Secondary | ICD-10-CM

## 2022-06-25 DIAGNOSIS — N1832 Chronic kidney disease, stage 3b: Secondary | ICD-10-CM | POA: Diagnosis not present

## 2022-06-25 DIAGNOSIS — L97511 Non-pressure chronic ulcer of other part of right foot limited to breakdown of skin: Secondary | ICD-10-CM | POA: Diagnosis not present

## 2022-06-25 DIAGNOSIS — I4819 Other persistent atrial fibrillation: Secondary | ICD-10-CM | POA: Diagnosis not present

## 2022-06-25 DIAGNOSIS — M5416 Radiculopathy, lumbar region: Secondary | ICD-10-CM | POA: Diagnosis not present

## 2022-06-25 DIAGNOSIS — M1A0710 Idiopathic chronic gout, right ankle and foot, without tophus (tophi): Secondary | ICD-10-CM | POA: Diagnosis not present

## 2022-06-25 DIAGNOSIS — Z952 Presence of prosthetic heart valve: Secondary | ICD-10-CM | POA: Insufficient documentation

## 2022-06-25 DIAGNOSIS — M7989 Other specified soft tissue disorders: Secondary | ICD-10-CM | POA: Diagnosis not present

## 2022-06-25 DIAGNOSIS — Z79899 Other long term (current) drug therapy: Secondary | ICD-10-CM | POA: Diagnosis not present

## 2022-06-25 DIAGNOSIS — I10 Essential (primary) hypertension: Secondary | ICD-10-CM | POA: Diagnosis not present

## 2022-06-25 DIAGNOSIS — I2510 Atherosclerotic heart disease of native coronary artery without angina pectoris: Secondary | ICD-10-CM | POA: Diagnosis not present

## 2022-06-25 LAB — LAB REPORT - SCANNED
Creatinine, POC: 74.2 mg/dL
EGFR: 47
Protein/Creatinine Ratio: 389

## 2022-06-25 MED ORDER — FUROSEMIDE 20 MG PO TABS: ORAL_TABLET | ORAL | 3 refills | Status: DC

## 2022-06-25 MED ORDER — PREDNISONE 10 MG PO TABS
10.0000 mg | ORAL_TABLET | Freq: Every day | ORAL | 0 refills | Status: DC
Start: 2022-06-25 — End: 2022-07-22

## 2022-06-25 NOTE — Patient Instructions (Signed)
Medication Instructions:  START Furosemide 40 mg on Mondays, Wednesdays, and Fridays only, take 20mg  all other days *If you need a refill on your cardiac medications before your next appointment, please call your pharmacy*   Lab Work: BMET in 3 weeks If you have labs (blood work) drawn today and your tests are completely normal, you will receive your results only by: MyChart Message (if you have MyChart) OR A paper copy in the mail If you have any lab test that is abnormal or we need to change your treatment, we will call you to review the results.   Testing/Procedures: NONE   Follow-Up: At [facility], you and your health needs are our priority.  As part of our continuing mission to provide you with exceptional heart care, we have created designated Provider Care Teams.  These Care Teams include your primary Cardiologist (physician) and Advanced Practice Providers (APPs -  Physician Assistants and Nurse Practitioners) who all work together to provide you with the care you need, when you need it.  We recommend signing up for the patient portal called "MyChart".  Sign up information is provided on this After Visit Summary.  MyChart is used to connect with patients for Virtual Visits (Telemedicine).  Patients are able to view lab/test results, encounter notes, upcoming appointments, etc.  Non-urgent messages can be sent to your provider as well.   To learn more about what you can do with MyChart, go to [email or web address].    Your next appointment:   3 month(s)  Provider:   Jari Favre, PA-C, Ronie Spies, PA-C, Robin Searing, NP, Eligha Bridegroom, NP, or Tereso Newcomer, PA-C     Then, Tonny Bollman, MD will plan to see you again in 1 year(s).     Other Instructions   For your  leg edema you  should do  the following 1. Leg elevation - I recommend the Lounge Dr. Leg rest.  See below for details  2. Salt restriction  -  Use potassium chloride instead of regular salt as a salt  substitute. 3. Walk regularly 4. Compression hose - [facility] [employer] store  5. Weight loss    Available on [email or web address] Or  Go to [email or web address]

## 2022-06-25 NOTE — Progress Notes (Signed)
Cardiology Office Note:    Date:  06/25/2022   ID:  [name], DOB Oct 28, 1930, MRN [id number]  PCP:  Joaquim Nam, MD   [facility] Providers Cardiologist:  Tonny Bollman, MD     Referring MD: Joaquim Nam, MD   Chief Complaint  Patient presents with   Leg Swelling    History of Present Illness:    [name] is a 89 y.o. male with a hx of coronary artery disease, permanent atrial fibrillation, and aortic valve disease status post TAVR April 14, 2022, presenting for follow-up evaluation.  Patient was added on because of right leg swelling.  He has been seen in the emergency department and there is no evidence of DVT.  There is a question of gout because he had pain in his ankle.  He has seen Dr. Lajoyce Corners and has been prescribed prednisone which seems appropriate.  They were concerned about whether this could impact his heart situation and I told him to go ahead with the medication.  He is taking an increased dose of furosemide and this should help with any potential fluid overload.  The patient has been having right leg swelling.  No orthopnea or PND.  He does have intermittent shortness of breath but overall feels like he is breathing pretty well.  He is had no recent chest pain or pressure.  He has been advised to use compression on his right leg.  Past Medical History:  Diagnosis Date   Arthritis    Bradycardia    a. nocturnal with pauses overnight on tele likely due to OSA 10/2015.   CKD (chronic kidney disease), stage III    Coronary artery disease    a. prev nonobst. b. LHC 10/24/15: occlusion of small diffusely diseased OM2 s/p balloon angioplasty, mild nonobstructive disease of mLAD and mRCA, normal LVEDP   Diabetes mellitus without complication    01/02/21 Pt's daughter states patient does not  have diabetes.  A1C 07/23/20 was 7.2, patientr had been on medication.   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 Excell Seltzer & Dr. Leafy Ro   Severe aortic stenosis    Vertigo     Past Surgical History:  Procedure Laterality Date   AMPUTATION Right 03/03/2019   Procedure: RIGHT 2ND TOE AMPUTATION;  Surgeon: Nadara Mustard, MD;  Location: [facility] OR;  Service: Orthopedics;  Laterality: Right;   AMPUTATION Right 01/03/2021   Procedure: RIGHT 3RD TOE AMPUTATION;  Surgeon: Nadara Mustard, MD;  Location: [facility] OR;  Service: Orthopedics;  Laterality: Right;   BALLOON DILATION N/A 10/22/2014   Procedure: BALLOON DILATION;  Surgeon: Iva Boop, MD;  Location: [facility] ENDOSCOPY;  Service: Endoscopy;  Laterality: N/A;   CARDIAC CATHETERIZATION  07/2008   CARDIAC CATHETERIZATION N/A 10/24/2015  Procedure: Left Heart Cath and Coronary Angiography;  Surgeon: Yvonne Kendall, MD;  Location: [facility] INVASIVE CV LAB;  Service: Cardiovascular;  Laterality: N/A;   CARDIAC CATHETERIZATION N/A 10/24/2015   Procedure: Coronary Balloon Angioplasty;  Surgeon: Yvonne Kendall, MD;  Location: [facility] INVASIVE CV LAB;  Service: Cardiovascular;  Laterality: N/A;   Carotid Dopplers  09/2009   no sig extracranial stenosis and vertebral arteries had antegrade flow   CATARACT EXTRACTION W/ INTRAOCULAR LENS  IMPLANT, BILATERAL Bilateral    CORONARY STENT INTERVENTION N/A 03/11/2022   Procedure: CORONARY STENT INTERVENTION;  Surgeon: Tonny Bollman, MD;  Location: [facility] INVASIVE CV LAB;  Service: Cardiovascular;  Laterality:  N/A;   ESOPHAGOGASTRODUODENOSCOPY (EGD) WITH ESOPHAGEAL DILATION     "I've had it stretched 3 times" (10/23/2015)   ESOPHAGOGASTRODUODENOSCOPY (EGD) WITH ESOPHAGEAL DILATION  06/2011   /medical hx above  (09/09/2017)   ESOPHAGOGASTRODUODENOSCOPY (EGD) WITH PROPOFOL N/A 10/22/2014   Procedure: ESOPHAGOGASTRODUODENOSCOPY (EGD) WITH PROPOFOL;  Surgeon: Iva Boop, MD;  Location: [facility] ENDOSCOPY;  Service: Endoscopy;  Laterality: N/A;   FINGER SURGERY Right 02/1964   "cut little finger off; had it reattached"   I & D EXTREMITY Right 09/10/2017   Procedure: RIGHT FOOT DEBRIDEMENT;  Surgeon: Nadara Mustard, MD;  Location: [facility] OR;  Service: Orthopedics;  Laterality: Right;   INTRAOPERATIVE TRANSTHORACIC ECHOCARDIOGRAM N/A 04/14/2022   Procedure: INTRAOPERATIVE TRANSTHORACIC ECHOCARDIOGRAM;  Surgeon: Tonny Bollman, MD;  Location: [facility] INVASIVE CV LAB;  Service: Open Heart Surgery;  Laterality: N/A;   MRI of brain  09/2009   chronic microvascular ischemia   OSA Sleep Study     per Dr. Vickey Huger   RIGHT/LEFT HEART CATH AND CORONARY ANGIOGRAPHY N/A 03/11/2022   Procedure: RIGHT/LEFT HEART CATH AND CORONARY ANGIOGRAPHY;  Surgeon: Tonny Bollman, MD;  Location: [facility] INVASIVE CV LAB;  Service: Cardiovascular;  Laterality: N/A;   SHOULDER ARTHROSCOPY W/ ROTATOR CUFF REPAIR Right 2008   TRANSCATHETER AORTIC VALVE REPLACEMENT, TRANSFEMORAL Left 04/14/2022   Procedure: Transcatheter Aortic Valve Replacement, Transfemoral;  Surgeon: Tonny Bollman, MD;  Location: [facility] INVASIVE CV LAB;  Service: Open Heart Surgery;  Laterality: Left;    Current Medications: Current Meds  Medication Sig   acetaminophen (TYLENOL) 500 MG tablet Take 500 mg by mouth as needed for mild pain.   allopurinol (ZYLOPRIM) 100 MG tablet TAKE 1 TABLET BY MOUTH EVERY DAY   azithromycin (ZITHROMAX) 500 MG tablet TAKE 500 MG BY MOUTH  1 HOUR PRIOR TO ANY DENTAL CLEANINGS AND PROCEDURES.   clopidogrel (PLAVIX) 75 MG tablet TAKE 1 TABLET BY MOUTH EVERY  DAY   cyanocobalamin (,VITAMIN B-12,) 1000 MCG/ML injection IM every 28 days (Patient taking differently: Inject 1,000 mcg into the muscle every 30 (thirty) days.)   diltiazem (CARDIZEM CD) 180 MG 24 hr capsule Take 1 capsule (180 mg total) by mouth daily.   furosemide (LASIX) 20 MG tablet Take 2 tablets (40mg ) by mouth on Mondays, Wednesdays, and Fridays only, take 1 tablet (20 mg) all other days   lisinopril (ZESTRIL) 20 MG tablet Take 10 mg by mouth in the morning and at bedtime.   metoprolol tartrate (LOPRESSOR) 25 MG tablet Take 0.5 tablets (12.5 mg total) by mouth 2 (two) times daily.   pantoprazole (PROTONIX) 40 MG tablet Take 1 tablet (40 mg total) by mouth daily.   predniSONE (DELTASONE) 10 MG tablet Take 1 tablet (10 mg total) by mouth daily with breakfast.   tamsulosin (FLOMAX) 0.4 MG CAPS capsule TAKE 1 CAPSULE BY MOUTH EVERY DAY (Patient  taking differently: Take 0.4 mg by mouth daily.)   warfarin (COUMADIN) 4 MG tablet TAKE 1 TO 1 AND 1/2 TABLETS BY MOUTH DAILY OR AS DIRECTED BY COUMADIN CLINIC   [DISCONTINUED] furosemide (LASIX) 20 MG tablet Take 1 tablet (20 mg total) by mouth daily.     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  Tobacco Use   Smoking status: Former    Packs/day: 0.12    Years: 4.00    Additional pack years: 0.00    Total pack years: 0.48    Types: Cigarettes    Quit date: 03/16/1969    Years since quitting: 53.3   Smokeless tobacco: Former   Tobacco comments:    "chewed when I smoked; smoked 1 pack/week for about 4 years,  quit 1971  Vaping Use   Vaping Use: Never used  Substance and Sexual Activity   Alcohol use: Not Currently    Alcohol/week: 0.0 standard drinks of alcohol    Comment: 09/09/2017  "Quit all alcohol in 1970's"   Drug use: Never   Sexual activity: Not on file  Other Topics Concern   Not on file  Social History Narrative   [place], married 1953, widowed after 62 years   Retired Naval architecttruck driver, still does Aeronautical engineerlandscaping.   He is not routinely exercising.   Daily caffeine    Social Determinants of Health   Financial Resource Strain: Low Risk  (11/27/2021)   Overall Financial Resource Strain (CARDIA)    Difficulty of Paying Living Expenses: Not hard at all  Food Insecurity: No Food Insecurity (04/16/2022)   Hunger Vital Sign    Worried About Running Out of Food in the Last Year: Never true    Ran Out of Food in the Last Year: Never true  Transportation Needs: No Transportation Needs (11/27/2021)   PRAPARE - Administrator, Civil ServiceTransportation    Lack of Transportation (Medical): No    Lack of Transportation (Non-Medical): No  Physical Activity: Insufficiently Active (11/27/2021)   Exercise Vital Sign    Days of Exercise per Week: 7 days    Minutes of Exercise per Session: 20 min  Stress: No Stress Concern Present (11/27/2021)   [employer] - Occupational Stress Questionnaire    Feeling of Stress : Not at all  Social Connections: Moderately Integrated (11/27/2021)   Social Connection and Isolation Panel [NHANES]    Frequency of Communication with Friends and Family: Twice a week    Frequency of Social Gatherings with Friends and Family: Twice a week    Attends Religious Services: 1 to 4 times per year    Active Member of [employer]ubs or Organizations: No    Attends Engineer, structuralClub or Organization Meetings: More than 4 times per year    Marital Status: Widowed     Family History: The patient's family history includes Dementia in his father; Heart disease in his brother; Hypertension in  his father and another family member; Pneumonia in his father; Sleep apnea in his brother and daughter; Stomach cancer in his mother. There is no history of Prostate cancer or Colon  cancer.  ROS:   Please see the history of present illness.    All other systems reviewed and are negative.  EKGs/Labs/Other Studies Reviewed:    The following studies were reviewed today: Cardiac Studies & Procedures   CARDIAC CATHETERIZATION  CARDIAC CATHETERIZATION 03/11/2022  Narrative   Mid RCA lesion is 15% stenosed.   Ost 2nd Mrg to 2nd Mrg lesion is 50% stenosed.   2nd Mrg lesion is 90% stenosed.   Mid Cx lesion is 70% stenosed.   Prox RCA lesion is 90% stenosed.   Prox LAD to Mid LAD lesion is 50% stenosed.   A drug-eluting stent was successfully placed using a SYNERGY XD 3.0X24.   Post intervention, there is a 0% residual stenosis.   There is severe aortic valve stenosis.  1.  Severe aortic stenosis with mean transaortic gradient 42 mmHg, calculated aortic valve area 0.6 cm 2.  Two-vessel coronary artery disease with severe stenosis of the proximal RCA, treated with PCI using a 3.0 x 24 mm Synergy DES and severe stenosis of the OM branch of the circumflex with recommendations for medical therapy due to lesion complexity 3.  Moderate nonobstructive diffuse proximal to mid LAD stenosis estimated at 50%  Recommendations: Resume warfarin today, resume Lovenox bridging tomorrow, clopidogrel 75 mg daily concurrent with warfarin anticoagulation for 6 months.  Will withhold aspirin due to bleeding risk of triple therapy in this 89 year old gentleman.  Same-day PCI discharge protocol if criteria met.  Findings Coronary Findings Diagnostic  Dominance: Right  Left Anterior Descending Prox LAD to Mid LAD lesion is 50% stenosed.  First Diagonal Branch Vessel is large in size.  Second Diagonal Branch Vessel is small in size.  Third Diagonal Branch Vessel is small in size.  Left Circumflex Mid  Cx lesion is 70% stenosed.  First Obtuse Marginal Branch Vessel is small in size.  Second Obtuse Marginal Branch Ost 2nd Mrg to 2nd Mrg lesion is 50% stenosed. 2nd Mrg lesion is 90% stenosed. Vessel is the culprit lesion. The lesion was previously treated . Extends back to LCx (involves OM ostium)  Third Obtuse Marginal Branch Vessel is moderate in size.  Right Coronary Artery Vessel is moderate in size. Prox RCA lesion is 90% stenosed. Mid RCA lesion is 15% stenosed. The lesion is irregular.  Intervention  Prox RCA lesion Stent CATH LAUNCHER 6FR JR4 guide catheter was inserted. Lesion crossed with guidewire using a WIRE COUGAR XT STRL 190CM. Pre-stent angioplasty was performed using a BALLN EMERGE MR 2.5X15. Maximum pressure:  8 atm. A drug-eluting stent was successfully placed using a SYNERGY XD 3.0X24. Maximum pressure: 14 atm. Post-stent angioplasty was performed using a BALLN Preston EMERGE MR 3.25X15. Maximum pressure:  16 atm. Post-Intervention Lesion Assessment The intervention was successful. Pre-interventional TIMI flow is 3. Post-intervention TIMI flow is 3. No complications occurred at this lesion. There is a 0% residual stenosis post intervention.   CARDIAC CATHETERIZATION  CARDIAC CATHETERIZATION 10/24/2015  Narrative  Mid RCA lesion, 15 %stenosed.  Ost 2nd Mrg to 2nd Mrg lesion, 50 %stenosed.  Mid LAD lesion, 30 %stenosed.  2nd Mrg lesion, 100 %stenosed.  Post intervention, there is a 30% residual stenosis.  LV end diastolic pressure is normal.  1.  Occlusion of small, diffusely diseased OM2 branch. 2.  Mild, non-obstructive coronary artery disease involving the mid LAD and mid RCA. 3.  Normal left ventricular filling pressure (LVEDP 13 mmHg). 4.  Successful balloon angioplasty of OM2 using 2.0 x 12 mm balloon with improvement  in stenosis from 100% to 30% with restoration of TIMI-3 flow.  Stent was not placed due to the small vessel size with diffuse disease  extending to the ostium.  Plan: 1.  Dual antiplatelet therapy for at least 1 month.  Given that patient is on warfarin as well, would recommend switching from ticagrelor (loaded in cath lab) to clopidogrel to reduce risk for bleeding complications. 2.  Restart warfarin tomorrow. 3.  Aggressive secondary prevention. 4.  Continue IV hydration x 6 hours, with close follow-up of renal status.  Findings Coronary Findings Diagnostic  Dominance: Right  Left Anterior Descending  First Diagonal Branch Vessel is large in size.  Second Diagonal Branch Vessel is small in size.  Third Diagonal Branch Vessel is small in size.  Left Circumflex  First Obtuse Marginal Branch Vessel is small in size.  Second Obtuse Marginal Branch  Culprit lesion.  Third Obtuse Marginal Branch Vessel is moderate in size.  Right Coronary Artery Vessel is moderate in size. The lesion is irregular.  Intervention  2nd Mrg lesion Angioplasty Lesion crossed with guidewire. Angioplasty alone was performed using a BALLOON EMERGE MR 2.0X12. Maximum pressure: 10 atm. There is no pre-interventional antegrade distal flow (TIMI 0).  The post-interventional distal flow is normal (TIMI 3). The intervention was successful . No complications occurred at this lesion. Stent was not placed due to small size of OM2 and diffuse disease extending from ostium to distal portion of the vessel. There is a 30% residual stenosis post intervention.     ECHOCARDIOGRAM  ECHOCARDIOGRAM COMPLETE 05/18/2022  Narrative ECHOCARDIOGRAM REPORT    Patient Name:   [name] Date of Exam: 05/18/2022 Medical Rec #:  [id number]      Height:       66.0 in Accession #:    [id number]     Weight:       221.6 lb Date of Birth:  [birth year]     BSA:          2.089 m Patient Age:    89 years       BP:           112/72 mmHg Patient Gender: M              HR:           78 bpm. Exam Location:  [address]  Procedure: 2D Echo, Cardiac  Doppler and Color Doppler  Indications:    Z95.2 Status Post TAVR  History:        Patient has prior history of Echocardiogram examinations, most recent 04/15/2022. CAD, Aortic Valve Disease, Arrythmias:Atrial Fibrillation, Signs/Symptoms:Dyspnea; Risk Factors:Hypertension, Diabetes, Dyslipidemia, Family History of Coronary Artery Disease, Former Smoker and Sleep Apnea. Obesity, Aortic Stenosis status post TAVR (04-14-22, 23mm Edwards S3UR). Aortic Valve: 23 mm Edwards Sapien prosthetic, stented (TAVR) valve is present in the aortic position.  Sonographer:    Farrel Conners RDCS Referring Phys: Wille Celeste THOMPSON  IMPRESSIONS   1. Left ventricular ejection fraction, by estimation, is 70 to 75%. The left ventricle has hyperdynamic function. The left ventricle has no regional wall motion abnormalities. There is mild left ventricular hypertrophy. Left ventricular diastolic parameters are indeterminate. 2. Right ventricular systolic function is low normal. The right ventricular size is normal. There is mildly elevated pulmonary artery systolic pressure. The estimated right ventricular systolic pressure is 38.0 mmHg. 3. Left atrial size was mildly dilated. 4. The mitral valve is degenerative. Trivial mitral valve regurgitation. Mild to moderate mitral stenosis. The  mean mitral valve gradient is 5.0 mmHg with average heart rate of 68 bpm. MVA 1.2 cm^2 by continuity equation. Severe mitral annular calcification. 5. The aortic valve has been repaired/replaced. Aortic valve regurgitation is not visualized. There is a 23 mm Edwards Sapien prosthetic (TAVR) valve present in the aortic position. Echo findings are consistent with normal structure and function of the aortic valve prosthesis. Aortic valve mean gradient measures 7.0 mmHg. Vmax 1.8 cm^2, EOA 1.7 cm^2, DI 0.57 6. Aortic dilatation noted. There is mild dilatation of the ascending aorta, measuring 39 mm. 7. The inferior vena cava is normal  in size with greater than 50% respiratory variability, suggesting right atrial pressure of 3 mmHg.  FINDINGS Left Ventricle: Left ventricular ejection fraction, by estimation, is 70 to 75%. The left ventricle has hyperdynamic function. The left ventricle has no regional wall motion abnormalities. The left ventricular internal cavity size was normal in size. There is mild left ventricular hypertrophy. Left ventricular diastolic parameters are indeterminate.  Right Ventricle: The right ventricular size is normal. Right vetricular wall thickness was not well visualized. Right ventricular systolic function is low normal. There is mildly elevated pulmonary artery systolic pressure. The tricuspid regurgitant velocity is 2.96 m/s, and with an assumed right atrial pressure of 3 mmHg, the estimated right ventricular systolic pressure is 38.0 mmHg.  Left Atrium: Left atrial size was mildly dilated.  Right Atrium: Right atrial size was normal in size.  Pericardium: There is no evidence of pericardial effusion.  Mitral Valve: The mitral valve is degenerative in appearance. Severe mitral annular calcification. Trivial mitral valve regurgitation. Mild to moderate mitral valve stenosis. MV peak gradient, 17.1 mmHg. The mean mitral valve gradient is 5.0 mmHg with average heart rate of 68 bpm.  Tricuspid Valve: The tricuspid valve is normal in structure. Tricuspid valve regurgitation is mild.  Aortic Valve: The aortic valve has been repaired/replaced. Aortic valve regurgitation is not visualized. Aortic valve mean gradient measures 7.0 mmHg. Aortic valve peak gradient measures 11.8 mmHg. Aortic valve area, by VTI measures 1.91 cm. There is a 23 mm Edwards Sapien prosthetic, stented (TAVR) valve present in the aortic position. Echo findings are consistent with normal structure and function of the aortic valve prosthesis.  Pulmonic Valve: The pulmonic valve was grossly normal. Pulmonic valve regurgitation is  not visualized.  Aorta: Aortic dilatation noted. There is mild dilatation of the ascending aorta, measuring 39 mm.  Venous: The inferior vena cava is normal in size with greater than 50% respiratory variability, suggesting right atrial pressure of 3 mmHg.  IAS/Shunts: The interatrial septum was not well visualized.   LEFT VENTRICLE PLAX 2D LVIDd:         4.30 cm   Diastology LVIDs:         2.10 cm   LV e' medial:    10.70 cm/s LV PW:         1.00 cm   LV E/e' medial:  16.9 LV IVS:        1.00 cm   LV e' lateral:   8.92 cm/s LVOT diam:     2.00 cm   LV E/e' lateral: 20.3 LV SV:         64 LV SV Index:   30 LVOT Area:     3.14 cm   RIGHT VENTRICLE RV Basal diam:  3.40 cm RV S prime:     11.55 cm/s TAPSE (M-mode): 1.3 cm RVSP:           38.0  mmHg  LEFT ATRIUM             Index        RIGHT ATRIUM           Index LA diam:        5.50 cm 2.63 cm/m   RA Pressure: 3.00 mmHg LA Vol (A2C):   73.3 ml 35.09 ml/m  RA Area:     20.40 cm LA Vol (A4C):   91.2 ml 43.66 ml/m  RA Volume:   62.30 ml  29.82 ml/m LA Biplane Vol: 81.0 ml 38.78 ml/m AORTIC VALVE AV Area (Vmax):    1.77 cm AV Area (Vmean):   1.75 cm AV Area (VTI):     1.91 cm AV Vmax:           171.50 cm/s AV Vmean:          115.750 cm/s AV VTI:            0.333 m AV Peak Grad:      11.8 mmHg AV Mean Grad:      7.0 mmHg LVOT Vmax:         96.50 cm/s LVOT Vmean:        64.600 cm/s LVOT VTI:          0.202 m LVOT/AV VTI ratio: 0.61  AORTA Ao Root diam: 3.90 cm Ao Asc diam:  3.60 cm  MITRAL VALVE                TRICUSPID VALVE MV Area (PHT): cm          TR Peak grad:   35.0 mmHg MV Area VTI:   1.20 cm     TR Vmax:        296.00 cm/s MV Peak grad:  17.1 mmHg    Estimated RAP:  3.00 mmHg MV Mean grad:  5.0 mmHg     RVSP:           38.0 mmHg MV Vmax:       2.07 m/s MV Vmean:      88.0 cm/s    SHUNTS MV Decel Time: 276 msec     Systemic VTI:  0.20 m MV E velocity: 181.00 cm/s  Systemic Diam: 2.00  cm  Epifanio Lesches MD Electronically signed by Epifanio Lesches MD Signature Date/Time: 05/18/2022/10:55:08 AM    Final     CT SCANS  CT CORONARY MORPH W/CTA COR W/SCORE 03/17/2022  Addendum 03/17/2022 12:37 PM ADDENDUM REPORT: 03/17/2022 12:34  CLINICAL DATA:  75M with severe aortic stenosis being evaluated for a TAVR procedure.  EXAM: Cardiac TAVR CT  TECHNIQUE: The patient was scanned on a [employer]. A 120 kV retrospective scan was triggered in the descending thoracic aorta at 111 HU's. Gantry rotation speed was 250 msecs and collimation was .6 mm. No beta blockade or nitro were given. The 3D data set was reconstructed in 5% intervals of the R-R cycle. Systolic and diastolic phases were analyzed on a dedicated work station using MPR, MIP and VRT modes. The patient received 100 cc of contrast.  FINDINGS: Aortic Root:  Aortic valve: Trileaflet  Aortic valve calcium score: 2627  Aortic annulus:  Diameter: 28mm x 21mm  Perimeter: 75mm  Area: 424 mm^2  Calcifications: Severe calcifications adjacent to left coronary cusp  Coronary height: Min Left - 18mm, Min Right - 11mm  Sinotubular height: Left cusp - 25mm; Right cusp - 18mm; Noncoronary cusp - 22mm  LVOT (as measured 3 mm  below the annulus):  Diameter: 24mm x 21mm  Area: 391 mm^2  Calcifications: Severe calcifications inferior to left coronary cusp  Aortic sinus width: Left cusp - 35mm; Right cusp - 31mm; Noncoronary cusp - 32mm  Sinotubular junction width: 32mm x 29mm  Optimum Fluoroscopic Angle for Delivery: LAO 13 CRA 23  Cardiac:  Right atrium: Mild enlargement  Right ventricle: Normal size  Pulmonary arteries: Normal size  Pulmonary veins: Normal configuration  Left atrium: Moderate enlargement.  PFO  Left ventricle: Normal size  Pericardium: Normal thickness  Coronary arteries: S/p proximal RCA stent  Mitral valve: Severe mitral annular  calcification  IMPRESSION: 1. Trileaflet aortic valve with severe calcifications (AV calcium score 2627)  2. Aortic annulus measures 28mm x 21mm in diameter with perimeter 75mm and area 424 mm^2. Severe annular calcification adjacent to left coronary cusp extending into LVOT. Annular measurements are suitable for delivery of 23mm Edwards Sapien 3 valve  3. Low coronary height to RCA (11mm). Sufficient coronary height to left main (18mm)  4.  Optimum Fluoroscopic Angle for Delivery: LAO 13 CRA 23  5.  Severe mitral annular calcifications  6.  PFO  7.  S/p proximal RCA stent   Electronically Signed By: Epifanio Lesches M.D. On: 03/17/2022 12:34  Narrative EXAM: OVER-READ INTERPRETATION  CT CHEST  The following report is a limited chest CT over-read performed by radiologist Dr. Cleone Slim of [facility] on 03/17/2022. This over-read does not include interpretation of cardiac or coronary anatomy or pathology. The cardiac CTA interpretation by the cardiologist is attached.  COMPARISON:  04/28/2006 chest CT angiogram. 05/08/2021 chest radiograph.  FINDINGS: Please see the separate concurrent chest CT angiogram report for details.  IMPRESSION: Please see the separate concurrent chest CT angiogram report for details.  Electronically Signed: By: Delbert Phenix M.D. On: 03/17/2022 11:22           EKG:  EKG is not ordered today.    Recent Labs: 04/10/2022: ALT 22 04/15/2022: Magnesium 2.2 06/17/2022: B Natriuretic Peptide 216.0; BUN 27; Creatinine, Ser 1.75; Hemoglobin 13.8; Platelets 173; Potassium 4.2; Sodium 133  Recent Lipid Panel    Component Value Date/Time   CHOL 166 05/16/2019 0923   TRIG 279.0 (H) 05/16/2019 0923   HDL 26.40 (L) 05/16/2019 0923   CHOLHDL 6 05/16/2019 0923   VLDL 55.8 (H) 05/16/2019 0923   LDLCALC 82 10/24/2015 0430   LDLDIRECT 95.0 05/16/2019 0923     Risk Assessment/Calculations:    CHA2DS2-VASc Score = 6   This  indicates a 9.7% annual risk of stroke. The patient's score is based upon: CHF History: 1 HTN History: 1 Diabetes History: 1 Stroke History: 0 Vascular Disease History: 1 Age Score: 2 Gender Score: 0               Physical Exam:    VS:  BP 120/70   Pulse 89   Ht 5' 5.5" (1.664 m)   Wt 209 lb 3.2 oz (94.9 kg)   SpO2 95%   BMI 34.28 kg/m     Wt Readings from Last 3 Encounters:  06/25/22 209 lb 3.2 oz (94.9 kg)  06/17/22 211 lb (95.7 kg)  05/18/22 206 lb 12.8 oz (93.8 kg)     GEN:  Well nourished, well developed in no acute distress HEENT: Normal NECK: No JVD; No carotid bruits LYMPHATICS: No lymphadenopathy CARDIAC: Irregularly irregular with no murmur RESPIRATORY:  Clear to auscultation without rales, wheezing or rhonchi  ABDOMEN: Soft, non-tender, non-distended, obese  MUSCULOSKELETAL: 2+ right pretibial and ankle edema, trace edema on the left; No deformity  SKIN: Warm and dry NEUROLOGIC:  Alert and oriented x 3 PSYCHIATRIC:  Normal affect   ASSESSMENT:    1. S/P TAVR (transcatheter aortic valve replacement)   2. Stage 3b chronic kidney disease   3. Primary hypertension   4. Persistent atrial fibrillation   5. Coronary artery disease involving native coronary artery of native heart without angina pectoris   6. Right leg swelling   7. Medication management    PLAN:    In order of problems listed above:  Normal function of his aortic bioprosthesis has been noted on postoperative echo imaging.  The patient is status post TAVR in January.  He will have a 1 year follow-up scan per protocol.  SBE prophylaxis is indicated. Recent labs reviewed with creatinine 1.75.  Renal function is stable.  Furosemide has been increased and we will arrange for follow-up labs in a few weeks. Blood pressure is well-controlled.  Home readings show blood pressures in the 130s over 60s on average. Tolerating rate control and anticoagulation with warfarin. Stable with no angina on  current medical therapy.  Status post PCI in December 2023 when his RCA was treated.  I will see him back in July and likely stop his clopidogrel at that time. Suspect this is primarily related to venous disease.  His furosemide has been increased to 40 mg on Mondays, Wednesdays, and Fridays.  This seems reasonable and he will continue this.  Will repeat labs as outlined above in a few weeks.  Discussed compression as well as leg elevation.  Demonstrated the lounge doctor device to him.           Medication Adjustments/Labs and Tests Ordered: Current medicines are reviewed at length with the patient today.  Concerns regarding medicines are outlined above.  Orders Placed This Encounter  Procedures   Basic metabolic panel   Meds ordered this encounter  Medications   furosemide (LASIX) 20 MG tablet    Sig: Take 2 tablets (40mg ) by mouth on Mondays, Wednesdays, and Fridays only, take 1 tablet (20 mg) all other days    Dispense:  120 tablet    Refill:  3    Patient Instructions  Medication Instructions:  START Furosemide 40 mg on Mondays, Wednesdays, and Fridays only, take 20mg  all other days *If you need a refill on your cardiac medications before your next appointment, please call your pharmacy*   Lab Work: BMET in 3 weeks If you have labs (blood work) drawn today and your tests are completely normal, you will receive your results only by: MyChart Message (if you have MyChart) OR A paper copy in the mail If you have any lab test that is abnormal or we need to change your treatment, we will call you to review the results.   Testing/Procedures: NONE   Follow-Up: At [facility], you and your health needs are our priority.  As part of our continuing mission to provide you with exceptional heart care, we have created designated Provider Care Teams.  These Care Teams include your primary Cardiologist (physician) and Advanced Practice Providers (APPs -  Physician Assistants  and Nurse Practitioners) who all work together to provide you with the care you need, when you need it.  We recommend signing up for the patient portal called "MyChart".  Sign up information is provided on this After Visit Summary.  MyChart is used to connect with patients for Virtual  Visits (Telemedicine).  Patients are able to view lab/test results, encounter notes, upcoming appointments, etc.  Non-urgent messages can be sent to your provider as well.   To learn more about what you can do with MyChart, go to [email or web address].    Your next appointment:   3 month(s)  Provider:   Jari Favre, PA-C, Ronie Spies, PA-C, Robin Searing, NP, Eligha Bridegroom, NP, or Tereso Newcomer, PA-C     Then, Tonny Bollman, MD will plan to see you again in 1 year(s).     Other Instructions   For your  leg edema you  should do  the following 1. Leg elevation - I recommend the Lounge Dr. Leg rest.  See below for details  2. Salt restriction  -  Use potassium chloride instead of regular salt as a salt substitute. 3. Walk regularly 4. Compression hose - Medical Supply store  5. Weight loss    Available on [email or web address] Or  Go to [email or web address]       Signed, Tonny Bollman, MD  06/25/2022 5:21 PM    [facility]

## 2022-06-26 NOTE — Progress Notes (Unsigned)
Office Visit Note   Patient: [name]           Date of Birth: 1930-05-12           MRN: [id number] Visit Date: 06/25/2022              Requested by: Joaquim Nam, MD [address] [place],  [place] [address] PCP: Joaquim Nam, MD  Chief Complaint  Patient presents with   Right Leg - Pain     History of insensate neuropathic ulceration and venous insufficiency, gout, radicular back pain.  Patient states he recently went to the emergency room secondary to right leg swelling.  He is status post a valve replacement in January he is on Plavix and Coumadin.  Patient has swelling in his lower extremity ultrasound was negative for DVT he is also on Lasix.  Patient has resumed taking his colchicine for a recent gout attack..  Patient states the colchicine resolved his symptoms.  He is currently on allopurinol.  Most recent uric acid was 7.1. HPI: Patient is an 89 year old gentleman who is seen for evaluation for both lower extremities.  Patient has a  Assessment & Plan: Visit Diagnoses: No diagnosis found.  Plan: Recommended compression for the venous insufficiency as well as elevation and exercise.  Nails were trimmed x 4.  Follow-Up Instructions: No follow-ups on file.   Ortho Exam  Patient is alert, oriented, no adenopathy, well-dressed, normal affect, normal respiratory effort. Examination patient has venous stasis swelling of the right lower extremity ultrasound negative for DVT.  Patient does have pitting edema that is painful.  He has a Wagner grade 1 ulcer beneath the fifth metatarsal head right foot.  The ulcer was debrided of skin and soft tissue after informed consent with a 10 blade knife.  Ulcer was 10 mm in diameter and 2 mm deep after debridement there is no abscess no exposed bone or tendon.  Onychomycotic nails were trimmed x 4.  Most Recent Uric Acid 7.1.  Circumference 44 cm.  Imaging: No results found. No images are attached to the  encounter.  Labs: Lab Results  Component Value Date   HGBA1C 6.8 (H) 05/16/2021   HGBA1C 7.2 (H) 07/23/2020   HGBA1C 7.7 (H) 09/11/2019   ESRSEDRATE 17 (H) 09/09/2017   CRP 1.1 (H) 09/09/2017   LABURIC 7.1 05/16/2021   LABURIC 7.6 12/16/2020   LABURIC 7.5 07/23/2020   REPTSTATUS 09/19/2017 FINAL 09/14/2017   GRAMSTAIN  09/10/2017    MODERATE WBC PRESENT,BOTH PMN AND MONONUCLEAR NO ORGANISMS SEEN    CULT  09/14/2017    NO GROWTH 5 DAYS Performed at [facility], [address], [place], [place] [address]      Lab Results  Component Value Date   ALBUMIN 4.1 04/10/2022   ALBUMIN 4.0 09/17/2020   ALBUMIN 4.2 07/23/2020    Lab Results  Component Value Date   MG 2.2 04/15/2022   MG 2.2 09/09/2017   No results found for: "VD25OH"  No results found for: "PREALBUMIN"    Latest Ref Rng & Units 06/17/2022   10:29 PM 04/15/2022    2:40 AM 04/14/2022    3:02 PM  CBC EXTENDED  WBC 4.0 - 10.5 K/uL 8.5  7.7    RBC 4.22 - 5.81 MIL/uL 4.81  5.17    Hemoglobin 13.0 - 17.0 g/dL 82.9  56.2  13.0   HCT 39.0 - 52.0 % 41.6  44.6  44.0   Platelets  150 - 400 K/uL 173  115       There is no height or weight on file to calculate BMI.  Orders:  No orders of the defined types were placed in this encounter.  Meds ordered this encounter  Medications   predniSONE (DELTASONE) 10 MG tablet    Sig: Take 1 tablet (10 mg total) by mouth daily with breakfast.    Dispense:  30 tablet    Refill:  0     Procedures: No procedures performed  Clinical Data: No additional findings.  ROS:  All other systems negative, except as noted in the HPI. Review of Systems  Objective: Vital Signs: There were no vitals taken for this visit.  Specialty Comments:  No specialty comments available.  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   Severe aortic stenosis 03/11/2022   Sinusitis 07/27/2021    PND (post-nasal drip) 07/17/2021   Obstructive sleep apnea treated with bilevel positive airway pressure (BiPAP) 03/19/2021   Lower urinary tract symptoms (LUTS) 09/16/2020   Gouty arthritis of left foot 06/20/2020   Diabetic peripheral neuropathy 10/04/2019   B12 deficiency 05/17/2019   Idiopathic chronic gout, unspecified site, without tophus (tophi) 10/06/2017   CAD S/P percutaneous coronary angioplasty    Chronic atrial fibrillation    CKD (chronic kidney disease), stage III    Hypertension    Hyperlipidemia    ETD (eustachian tube dysfunction) 05/24/2015   Chronic anticoagulation 09/25/2014   Esophageal stricture from GERD 06/08/2011   OSA on CPAP 09/22/2010   Gout 08/15/2008   OBESITY 08/15/2008   Essential hypertension 08/15/2008   Coronary artery disease with exertional angina 08/15/2008   GASTROESOPHAGEAL REFLUX DISEASE 08/15/2008   Past Medical History:  Diagnosis Date   Arthritis    Bradycardia    a. nocturnal with pauses overnight on tele likely due to OSA 10/2015.   CKD (chronic kidney disease), stage III    Coronary artery disease    a. prev nonobst. b. LHC 10/24/15: occlusion of small diffusely diseased OM2 s/p balloon angioplasty, mild nonobstructive disease of mLAD and mRCA, normal LVEDP   Diabetes mellitus without complication    01/02/21 Pt's daughter states patient does not  have diabetes.  A1C 07/23/20 was 7.2, patientr had been on medication.   Diabetic peripheral neuropathy 10/04/2019   Diverticulosis    Esophageal reflux    Esophageal stricture from GERD 06/08/2011   With stricture at GE junction on EGD, dilated 06/2011    Gastric ulcer    on EGD 2013   Gout, unspecified    "on daily RX" (09/09/2017)   History of blood transfusion 02/1964   "when I had right little finger cut off"   Hyperlipidemia    Hypertension    Obesity, unspecified    OSA on CPAP    Paroxysmal nocturnal dyspnea    Persistent atrial fibrillation    Pneumonia    S/P TAVR  (transcatheter aortic valve replacement) 04/14/2022   s/p TAVR with a 60mm Edwards S3UR via the TF approach by Dr. Excell Seltzer & Dr. Leafy Ro   Severe aortic stenosis    Vertigo     Family History  Problem Relation Age of Onset   Stomach cancer Mother        died in 43's    Hypertension Father        died in his 67's pna likely dementia   Dementia Father  Pneumonia Father    Heart disease Brother        S/P CABG   Sleep apnea Brother    Sleep apnea Daughter    Hypertension Other    Prostate cancer Neg Hx    Colon cancer Neg Hx     Past Surgical History:  Procedure Laterality Date   AMPUTATION Right 03/03/2019   Procedure: RIGHT 2ND TOE AMPUTATION;  Surgeon: Nadara Mustard, MD;  Location: [facility] OR;  Service: Orthopedics;  Laterality: Right;   AMPUTATION Right 01/03/2021   Procedure: RIGHT 3RD TOE AMPUTATION;  Surgeon: Nadara Mustard, MD;  Location: [facility] OR;  Service: Orthopedics;  Laterality: Right;   BALLOON DILATION N/A 10/22/2014   Procedure: BALLOON DILATION;  Surgeon: Iva Boop, MD;  Location: [facility] ENDOSCOPY;  Service: Endoscopy;  Laterality: N/A;   CARDIAC CATHETERIZATION  07/2008   CARDIAC CATHETERIZATION N/A 10/24/2015   Procedure: Left Heart Cath and Coronary Angiography;  Surgeon: Yvonne Kendall, MD;  Location: [facility] INVASIVE CV LAB;  Service: Cardiovascular;  Laterality: N/A;   CARDIAC CATHETERIZATION N/A 10/24/2015   Procedure: Coronary Balloon Angioplasty;  Surgeon: Yvonne Kendall, MD;  Location: [facility] INVASIVE CV LAB;  Service: Cardiovascular;  Laterality: N/A;   Carotid Dopplers  09/2009   no sig extracranial stenosis and vertebral arteries had antegrade flow   CATARACT EXTRACTION W/ INTRAOCULAR LENS  IMPLANT, BILATERAL Bilateral    CORONARY STENT INTERVENTION N/A 03/11/2022   Procedure: CORONARY STENT INTERVENTION;  Surgeon: Tonny Bollman, MD;  Location: [facility] INVASIVE CV LAB;  Service: Cardiovascular;  Laterality: N/A;   ESOPHAGOGASTRODUODENOSCOPY (EGD) WITH ESOPHAGEAL DILATION      "I've had it stretched 3 times" (10/23/2015)   ESOPHAGOGASTRODUODENOSCOPY (EGD) WITH ESOPHAGEAL DILATION  06/2011   /medical hx above  (09/09/2017)   ESOPHAGOGASTRODUODENOSCOPY (EGD) WITH PROPOFOL N/A 10/22/2014   Procedure: ESOPHAGOGASTRODUODENOSCOPY (EGD) WITH PROPOFOL;  Surgeon: Iva Boop, MD;  Location: [facility];  Service: Endoscopy;  Laterality: N/A;   FINGER SURGERY Right 02/1964   "cut little finger off; had it reattached"   I & D EXTREMITY Right 09/10/2017   Procedure: RIGHT FOOT DEBRIDEMENT;  Surgeon: Nadara Mustard, MD;  Location: [facility] OR;  Service: Orthopedics;  Laterality: Right;   INTRAOPERATIVE TRANSTHORACIC ECHOCARDIOGRAM N/A 04/14/2022   Procedure: INTRAOPERATIVE TRANSTHORACIC ECHOCARDIOGRAM;  Surgeon: Tonny Bollman, MD;  Location: [facility] INVASIVE CV LAB;  Service: Open Heart Surgery;  Laterality: N/A;   MRI of brain  09/2009   chronic microvascular ischemia   OSA Sleep Study     per Dr. Vickey Huger   RIGHT/LEFT HEART CATH AND CORONARY ANGIOGRAPHY N/A 03/11/2022   Procedure: RIGHT/LEFT HEART CATH AND CORONARY ANGIOGRAPHY;  Surgeon: Tonny Bollman, MD;  Location: [facility] INVASIVE CV LAB;  Service: Cardiovascular;  Laterality: N/A;   SHOULDER ARTHROSCOPY W/ ROTATOR CUFF REPAIR Right 2008   TRANSCATHETER AORTIC VALVE REPLACEMENT, TRANSFEMORAL Left 04/14/2022   Procedure: Transcatheter Aortic Valve Replacement, Transfemoral;  Surgeon: Tonny Bollman, MD;  Location: [facility] INVASIVE CV LAB;  Service: Open Heart Surgery;  Laterality: Left;   Social History   Occupational History   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

## 2022-06-27 ENCOUNTER — Encounter: Payer: Self-pay | Admitting: Orthopedic Surgery

## 2022-06-29 ENCOUNTER — Ambulatory Visit: Payer: Medicare Other | Admitting: Orthopedic Surgery

## 2022-07-03 ENCOUNTER — Ambulatory Visit: Payer: Medicare Other | Admitting: Family Medicine

## 2022-07-06 DIAGNOSIS — M170 Bilateral primary osteoarthritis of knee: Secondary | ICD-10-CM | POA: Diagnosis not present

## 2022-07-07 ENCOUNTER — Ambulatory Visit (INDEPENDENT_AMBULATORY_CARE_PROVIDER_SITE_OTHER): Payer: Medicare Other

## 2022-07-07 DIAGNOSIS — E538 Deficiency of other specified B group vitamins: Secondary | ICD-10-CM | POA: Diagnosis not present

## 2022-07-07 MED ORDER — CYANOCOBALAMIN 1000 MCG/ML IJ SOLN
1000.0000 ug | Freq: Once | INTRAMUSCULAR | Status: AC
Start: 2022-07-07 — End: 2022-07-07
  Administered 2022-07-07: 1000 ug via INTRAMUSCULAR

## 2022-07-07 NOTE — Progress Notes (Signed)
Per orders of Dr. Graham Duncan, injection of Vitamin B 12 given in right deltoid given by Purl Claytor. Patient tolerated injection well.  

## 2022-07-10 ENCOUNTER — Ambulatory Visit (INDEPENDENT_AMBULATORY_CARE_PROVIDER_SITE_OTHER): Payer: Medicare Other | Admitting: Family

## 2022-07-10 DIAGNOSIS — I872 Venous insufficiency (chronic) (peripheral): Secondary | ICD-10-CM

## 2022-07-10 DIAGNOSIS — L97511 Non-pressure chronic ulcer of other part of right foot limited to breakdown of skin: Secondary | ICD-10-CM | POA: Diagnosis not present

## 2022-07-14 ENCOUNTER — Telehealth: Payer: Self-pay | Admitting: Pharmacist

## 2022-07-14 DIAGNOSIS — I25118 Atherosclerotic heart disease of native coronary artery with other forms of angina pectoris: Secondary | ICD-10-CM

## 2022-07-14 DIAGNOSIS — Z952 Presence of prosthetic heart valve: Secondary | ICD-10-CM

## 2022-07-14 NOTE — Telephone Encounter (Signed)
PharmD reviewed patient chart to assess eligibility for [facility] services. Patient was determined to be a good candidate for the program given the complexity of the medication regimen and overall risk for hospitalization and/or high healthcare utilization.   Referral entered in order to outreach patient and offer appointment with PharmD. Referral cosigned to PCP.  

## 2022-07-15 ENCOUNTER — Encounter: Payer: Self-pay | Admitting: Family

## 2022-07-15 NOTE — Progress Notes (Signed)
Office Visit Note   Patient: [name]           Date of Birth: November 21, 1930           MRN: [id number] Visit Date: 07/10/2022              Requested by: Joaquim Nam, MD [address] [place],  [place] [address] PCP: Joaquim Nam, MD  Chief Complaint  Patient presents with   Right Leg - Follow-up   Left Leg - Follow-up      HPI: The patient is an 89 year old gentleman who is seen today for evaluation of right lower extremity ulceration.  He does have a history of insensate neuropathy, gout.  Assessment & Plan: Visit Diagnoses:  1. Right foot ulcer, limited to breakdown of skin (HCC)   2. Venous insufficiency (chronic) (peripheral)     Plan: Continue protected weightbearing close monitoring of bilateral feet.  He will follow-up in the office as needed.  Follow-Up Instructions: Return if symptoms worsen or fail to improve.   Ortho Exam  Patient is alert, oriented, no adenopathy, well-dressed, normal affect, normal respiratory effort. On examination of the right lower extremity he does have venous stasis swelling with trace edema.  He has a Wagner grade 1 ulcer beneath the fifth metatarsal head of the right foot this has completely healed.  There is no open area no depth there is scant callus buildup.  There is no erythema or warmth.  Imaging: No results found. No images are attached to the encounter.  Labs: Lab Results  Component Value Date   HGBA1C 6.8 (H) 05/16/2021   HGBA1C 7.2 (H) 07/23/2020   HGBA1C 7.7 (H) 09/11/2019   ESRSEDRATE 17 (H) 09/09/2017   CRP 1.1 (H) 09/09/2017   LABURIC 7.1 05/16/2021   LABURIC 7.6 12/16/2020   LABURIC 7.5 07/23/2020   REPTSTATUS 09/19/2017 FINAL 09/14/2017   GRAMSTAIN  09/10/2017    MODERATE WBC PRESENT,BOTH PMN AND MONONUCLEAR NO ORGANISMS SEEN    CULT  09/14/2017    NO GROWTH 5 DAYS Performed at [facility], [address], [place], [place] [address]      Lab Results  Component Value Date    ALBUMIN 4.1 04/10/2022   ALBUMIN 4.0 09/17/2020   ALBUMIN 4.2 07/23/2020    Lab Results  Component Value Date   MG 2.2 04/15/2022   MG 2.2 09/09/2017   No results found for: "VD25OH"  No results found for: "PREALBUMIN"    Latest Ref Rng & Units 06/17/2022   10:29 PM 04/15/2022    2:40 AM 04/14/2022    3:02 PM  CBC EXTENDED  WBC 4.0 - 10.5 K/uL 8.5  7.7    RBC 4.22 - 5.81 MIL/uL 4.81  5.17    Hemoglobin 13.0 - 17.0 g/dL 86.5  78.4  69.6   HCT 39.0 - 52.0 % 41.6  44.6  44.0   Platelets 150 - 400 K/uL 173  115       There is no height or weight on file to calculate BMI.  Orders:  No orders of the defined types were placed in this encounter.  No orders of the defined types were placed in this encounter.    Procedures: No procedures performed  Clinical Data: No additional findings.  ROS:  All other systems negative, except as noted in the HPI. Review of Systems  Objective: Vital Signs: There were no vitals taken for this visit.  Specialty Comments:  No  specialty comments available.  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   Severe aortic stenosis 03/11/2022   Sinusitis 07/27/2021   PND (post-nasal drip) 07/17/2021   Obstructive sleep apnea treated with bilevel positive airway pressure (BiPAP) 03/19/2021   Lower urinary tract symptoms (LUTS) 09/16/2020   Gouty arthritis of left foot 06/20/2020   Diabetic peripheral neuropathy (HCC) 10/04/2019   B12 deficiency 05/17/2019   Idiopathic chronic gout, unspecified site, without tophus (tophi) 10/06/2017   CAD S/P percutaneous coronary angioplasty    Chronic atrial fibrillation (HCC)    CKD (chronic kidney disease), stage III (HCC)    Hypertension    Hyperlipidemia    ETD (eustachian tube dysfunction) 05/24/2015   Chronic anticoagulation 09/25/2014   Esophageal stricture from GERD 06/08/2011   OSA on CPAP 09/22/2010   Gout  08/15/2008   OBESITY 08/15/2008   Essential hypertension 08/15/2008   Coronary artery disease with exertional angina (HCC) 08/15/2008   GASTROESOPHAGEAL REFLUX DISEASE 08/15/2008   Past Medical History:  Diagnosis Date   Arthritis    Bradycardia    a. nocturnal with pauses overnight on tele likely due to OSA 10/2015.   CKD (chronic kidney disease), stage III (HCC)    Coronary artery disease    a. prev nonobst. b. LHC 10/24/15: occlusion of small diffusely diseased OM2 s/p balloon angioplasty, mild nonobstructive disease of mLAD and mRCA, normal LVEDP   Diabetes mellitus without complication (HCC)    01/02/21 Pt's daughter states patient does not  have diabetes.  A1C 07/23/20 was 7.2, patientr had been on medication.   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 Excell Seltzer & Dr. Leafy Ro   Severe aortic stenosis    Vertigo     Family History  Problem Relation Age of Onset   Stomach cancer Mother        died in 20's    Hypertension Father        died in his 55's pna likely dementia   Dementia Father    Pneumonia Father    Heart disease Brother        S/P CABG   Sleep apnea Brother    Sleep apnea Daughter    Hypertension Other    Prostate cancer Neg Hx    Colon cancer Neg Hx     Past Surgical History:  Procedure Laterality Date   AMPUTATION Right 03/03/2019   Procedure: RIGHT 2ND TOE  AMPUTATION;  Surgeon: Nadara Mustard, MD;  Location: [facility] OR;  Service: Orthopedics;  Laterality: Right;   AMPUTATION Right 01/03/2021   Procedure: RIGHT 3RD TOE AMPUTATION;  Surgeon: Nadara Mustard, MD;  Location: [facility] OR;  Service: Orthopedics;  Laterality: Right;   BALLOON DILATION  N/A 10/22/2014   Procedure: BALLOON DILATION;  Surgeon: Iva Boop, MD;  Location: [facility];  Service: Endoscopy;  Laterality: N/A;   CARDIAC CATHETERIZATION  07/2008   CARDIAC CATHETERIZATION N/A 10/24/2015   Procedure: Left Heart Cath and Coronary Angiography;  Surgeon: Yvonne Kendall, MD;  Location: [facility] INVASIVE CV LAB;  Service: Cardiovascular;  Laterality: N/A;   CARDIAC CATHETERIZATION N/A 10/24/2015   Procedure: Coronary Balloon Angioplasty;  Surgeon: Yvonne Kendall, MD;  Location: [facility] INVASIVE CV LAB;  Service: Cardiovascular;  Laterality: N/A;   Carotid Dopplers  09/2009   no sig extracranial stenosis and vertebral arteries had antegrade flow   CATARACT EXTRACTION W/ INTRAOCULAR LENS  IMPLANT, BILATERAL Bilateral    CORONARY STENT INTERVENTION N/A 03/11/2022   Procedure: CORONARY STENT INTERVENTION;  Surgeon: Tonny Bollman, MD;  Location: [facility] INVASIVE CV LAB;  Service: Cardiovascular;  Laterality: N/A;   ESOPHAGOGASTRODUODENOSCOPY (EGD) WITH ESOPHAGEAL DILATION     "I've had it stretched 3 times" (10/23/2015)   ESOPHAGOGASTRODUODENOSCOPY (EGD) WITH ESOPHAGEAL DILATION  06/2011   /medical hx above  (09/09/2017)   ESOPHAGOGASTRODUODENOSCOPY (EGD) WITH PROPOFOL N/A 10/22/2014   Procedure: ESOPHAGOGASTRODUODENOSCOPY (EGD) WITH PROPOFOL;  Surgeon: Iva Boop, MD;  Location: [facility] ENDOSCOPY;  Service: Endoscopy;  Laterality: N/A;   FINGER SURGERY Right 02/1964   "cut little finger off; had it reattached"   I & D EXTREMITY Right 09/10/2017   Procedure: RIGHT FOOT DEBRIDEMENT;  Surgeon: Nadara Mustard, MD;  Location: [facility] OR;  Service: Orthopedics;  Laterality: Right;   INTRAOPERATIVE TRANSTHORACIC ECHOCARDIOGRAM N/A  04/14/2022   Procedure: INTRAOPERATIVE TRANSTHORACIC ECHOCARDIOGRAM;  Surgeon: Tonny Bollman, MD;  Location: [facility] INVASIVE CV LAB;  Service: Open Heart Surgery;  Laterality: N/A;   MRI of brain  09/2009   chronic microvascular ischemia   OSA Sleep Study     per Dr. Vickey Huger   RIGHT/LEFT HEART CATH AND CORONARY ANGIOGRAPHY N/A 03/11/2022   Procedure: RIGHT/LEFT HEART CATH AND CORONARY ANGIOGRAPHY;  Surgeon: Tonny Bollman, MD;  Location: [facility] INVASIVE CV LAB;  Service: Cardiovascular;  Laterality: N/A;   SHOULDER ARTHROSCOPY W/ ROTATOR CUFF REPAIR Right 2008   TRANSCATHETER AORTIC VALVE REPLACEMENT, TRANSFEMORAL Left 04/14/2022   Procedure: Transcatheter Aortic Valve Replacement, Transfemoral;  Surgeon: Tonny Bollman, MD;  Location: [facility] INVASIVE CV LAB;  Service: Open Heart Surgery;  Laterality: Left;   Social History   Occupational History   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

## 2022-07-21 ENCOUNTER — Other Ambulatory Visit: Payer: Medicare Other

## 2022-07-21 ENCOUNTER — Ambulatory Visit: Payer: Medicare Other | Attending: Cardiovascular Disease | Admitting: *Deleted

## 2022-07-21 DIAGNOSIS — I482 Chronic atrial fibrillation, unspecified: Secondary | ICD-10-CM | POA: Diagnosis not present

## 2022-07-21 DIAGNOSIS — Z79899 Other long term (current) drug therapy: Secondary | ICD-10-CM | POA: Diagnosis not present

## 2022-07-21 DIAGNOSIS — Z952 Presence of prosthetic heart valve: Secondary | ICD-10-CM | POA: Diagnosis not present

## 2022-07-21 LAB — BASIC METABOLIC PANEL
BUN/Creatinine Ratio: 13 (ref 10–24)
BUN: 23 mg/dL (ref 8–27)
CO2: 26 mmol/L (ref 20–29)
Calcium: 9.2 mg/dL (ref 8.6–10.2)
Chloride: 99 mmol/L (ref 96–106)
Creatinine, Ser: 1.73 mg/dL — ABNORMAL HIGH (ref 0.76–1.27)
Glucose: 140 mg/dL — ABNORMAL HIGH (ref 70–99)
Potassium: 4.6 mmol/L (ref 3.5–5.2)
Sodium: 138 mmol/L (ref 134–144)
eGFR: 37 mL/min/{1.73_m2} — ABNORMAL LOW (ref >59)

## 2022-07-21 LAB — POCT INR: INR: 2.8 (ref 2.0–3.0)

## 2022-07-21 NOTE — Patient Instructions (Signed)
Description   Continue taking warfarin 1 tablet daily except for 1.5 tablets on Sundays and Thursdays. INR in 4 weeks. Call if any new medications or chnges [phone].

## 2022-07-22 ENCOUNTER — Other Ambulatory Visit: Payer: Self-pay | Admitting: Orthopedic Surgery

## 2022-07-22 ENCOUNTER — Ambulatory Visit: Payer: Medicare Other | Admitting: Pharmacist

## 2022-07-22 NOTE — Progress Notes (Signed)
Care Management & Coordination Services Pharmacy Note  07/22/2022 Name:  [name] MRN:  [id number] DOB:  16-Jun-1930  Summary: Initial Televisit -HTN: Pt reports BP at home 130-150s/70s, reasonable control given age -CAD: LDL 95 (05/2019) above ideal goal < 70, however pt has been intolerant to statins; recent stent (02/2022) and TAVR (03/2022); pt is on plavix + warfarin until June then will continue warfarin monotherapy -Gout: Pt reports ED visit for leg swelling last month was due to gout; discussed causes of gout and diet choices to prevent gout -DM: A1c 6.8% (05/2021) diet-controlled; pt is due for re-check  Recommendations/Changes made from today's visit: -No med changes -Recommend A1c and lipid panel at next OV  Follow up plan: -Pharmacist follow up PRN -PCP 09/10/22; Cardiology 09/21/22; Neurology 12/29/22   SUBJECTIVE: [name] is an 89 y.o. year old male who is a primary patient of Para March, Dwana Curd, MD.  The care coordination team was consulted for assistance with disease management and care coordination needs.    Engaged with patient by telephone for initial visit.  Recent office visits: 07/25/21 Dr Para March OV: sinusitis - rx doxycycline, call coumadin clinc  Recent consult visits: 07/10/22 NP Vanessa Barbara (Ortho): R foot ulcer, venous insufficiency.  06/25/22 Dr Excell Seltzer (Cardiology): ED f/u leg swelling - likely venous disease. Inc furosemide to 40 mg MWF + leg elevation. Repeat BMP 1 week.   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 Continue plavix/warfarin x 6 months then warfarin monotherapy. Lifelong dental SBE.   Hospital visits: 06/17/22 ED visit (AP): Unilateral leg edema - negative DVT. F/u outpatient.  04/14/22 planned admission [facility]): TAVR  03/11/22 planned admission [facility]): [facility] angiography   OBJECTIVE:  Lab Results  Component Value Date   CREATININE 1.73 (H) 07/21/2022   BUN 23 07/21/2022   GFR 28.41 (L) 07/29/2020   EGFR 37 (L) 07/21/2022    GFRNONAA 37 (L) 06/17/2022   GFRAA 55 (L) 03/03/2019   NA 138 07/21/2022   K 4.6 07/21/2022   CALCIUM 9.2 07/21/2022   CO2 26 07/21/2022   GLUCOSE 140 (H) 07/21/2022    Lab Results  Component Value Date/Time   HGBA1C 6.8 (H) 05/16/2021 03:44 PM   HGBA1C 7.2 (H) 07/23/2020 08:19 AM   GFR 28.41 (L) 07/29/2020 03:25 PM   GFR 26.26 (L) 07/23/2020 08:19 AM    Last diabetic Eye exam:  Lab Results  Component Value Date/Time   HMDIABEYEEXA No Retinopathy 05/26/2019 12:00 AM    Last diabetic Foot exam: No results found for: "HMDIABFOOTEX"   Lab Results  Component Value Date   CHOL 166 05/16/2019   HDL 26.40 (L) 05/16/2019   LDLCALC 82 10/24/2015   LDLDIRECT 95.0 05/16/2019   TRIG 279.0 (H) 05/16/2019   CHOLHDL 6 05/16/2019       Latest Ref Rng & Units 04/10/2022    9:30 AM 09/17/2020   11:18 AM 07/23/2020    8:19 AM  Hepatic Function  Total Protein 6.5 - 8.1 g/dL 7.9  7.5  7.4   Albumin 3.5 - 5.0 g/dL 4.1  4.0  4.2   AST 15 - 41 U/L 24  26  21    ALT 0 - 44 U/L 22  24  24    Alk Phosphatase 38 - 126 U/L 91  86  95   Total Bilirubin 0.3 - 1.2 mg/dL 0.8  1.1  0.6     Lab Results  Component Value Date/Time   TSH 2.33 05/16/2019 09:23 AM   TSH  1.56 02/13/2016 02:19 PM       Latest Ref Rng & Units 06/17/2022   10:29 PM 04/15/2022    2:40 AM 04/14/2022    3:02 PM  CBC  WBC 4.0 - 10.5 K/uL 8.5  7.7    Hemoglobin 13.0 - 17.0 g/dL 54.2  70.6  23.7   Hematocrit 39.0 - 52.0 % 41.6  44.6  44.0   Platelets 150 - 400 K/uL 173  115     Lab Results  Component Value Date/Time   INR 2.8 07/21/2022 11:05 AM   INR 3.1 (A) 06/23/2022 11:11 AM   INR 1.8 (H) 06/17/2022 10:29 PM   INR 1.9 (A) 05/26/2022 11:26 AM   INR 1.2 04/14/2022 09:24 AM   INR 1.8 (H) 04/10/2022 09:30 AM   INR 2.1 04/03/2022 10:56 AM   INR 1.7 03/17/2022 11:43 AM   INR 2.4 06/02/2010 08:01 AM    Lab Results  Component Value Date/Time   VITAMINB12 604 07/23/2020 08:19 AM   VITAMINB12 288 09/11/2019 02:42 PM     Clinical ASCVD: Yes  The ASCVD Risk score (Arnett DK, et al., 2019) failed to calculate for the following reasons:   The 2019 ASCVD risk score is only valid for ages 59 to 65    CHA2DS2/VAS Stroke Risk Points  Current as of 2 hours ago     5 >= 2 Points: High Risk    Points Metrics  0 Has Congestive Heart Failure:  No    Current as of 2 hours ago  1 Has Vascular Disease:  Yes    Current as of 2 hours ago  1 Has Hypertension:  Yes    Current as of 2 hours ago  2 Age:  68    Current as of 2 hours ago  1 Has Diabetes:  Yes    Current as of 2 hours ago  0 Had Stroke:  No  Had TIA:  No  Had Thromboembolism:  No    Current as of 2 hours ago  0 Male:  No    Current as of 2 hours ago        11/27/2021   11:06 AM 11/23/2020   11:05 AM 05/16/2019    2:45 PM  Depression screen PHQ 2/9  Decreased Interest 0 0 0  Down, Depressed, Hopeless 0 0 0  PHQ - 2 Score 0 0 0  Altered sleeping 0  0  Tired, decreased energy 0  0  Change in appetite 0  0  Feeling bad or failure about yourself  0  0  Trouble concentrating 0  0  Moving slowly or fidgety/restless 0  0  Suicidal thoughts 0  0  PHQ-9 Score 0  0  Difficult doing work/chores Not difficult at all  Not difficult at all     Social History   Tobacco Use  Smoking Status Former   Packs/day: 0.12   Years: 4.00   Additional pack years: 0.00   Total pack years: 0.48   Types: Cigarettes   Quit date: 03/16/1969   Years since quitting: 53.3  Smokeless Tobacco Former  Tobacco Comments   "chewed when I smoked; smoked 1 pack/week for about 4 years, quit 1971   BP Readings from Last 3 Encounters:  06/25/22 120/70  06/18/22 (!) 116/58  05/18/22 126/70   Pulse Readings from Last 3 Encounters:  06/25/22 89  06/18/22 74  05/18/22 85   Wt Readings from Last 3 Encounters:  06/25/22 209 lb 3.2  oz (94.9 kg)  06/17/22 211 lb (95.7 kg)  05/18/22 206 lb 12.8 oz (93.8 kg)   BMI Readings from Last 3 Encounters:  06/25/22 34.28 kg/m   06/17/22 34.06 kg/m  05/18/22 33.38 kg/m    Allergies  Allergen Reactions   Penicillins Shortness Of Breath and Swelling    Patient tolerated Rocephin injection 2019  PATIENT HAS HAD A PCN REACTION WITH IMMEDIATE RASH, FACIAL/TONGUE/THROAT SWELLING, SOB, OR LIGHTHEADEDNESS WITH HYPOTENSION:  #  #  YES  #  Has patient had a PCN reaction causing severe rash involving mucus membranes or skin necrosis: NO Has patient had a PCN reaction that required hospitalization NO Has patient had a PCN reaction occurring within the last 10 years: NO If all of the above answers are "NO", then may proceed with Cephalosporin use.    Atorvastatin Other (See Comments)    No energy, just felt bad     Pravastatin Other (See Comments)    myalgias   Atrovent Nasal Spray [Ipratropium] Other (See Comments)    Dizzy with use   Colchicine Other (See Comments)    Due to renal function- can use prednisone if needed    Hydrocodone Itching and Other (See Comments)    "swimmy headed" after use   Nsaids Other (See Comments)    Due to renal function   Uloric [Febuxostat] Other (See Comments)    Would avoid, worsening gout sx with use.    Vancomycin Other (See Comments)    Red man syndrome- tolerated with slow run and benadryl use   Clarithromycin Diarrhea   Doxycycline Other (See Comments)    GI upset     Medications Reviewed Today     Reviewed by Kathyrn Sheriff, [facility] (Pharmacist) on 07/22/22 at 1139  Med List Status: <None>   Medication Order Taking? Sig Documenting Provider Last Dose Status Informant  acetaminophen (TYLENOL) 500 MG tablet [id number] Yes Take 500 mg by mouth as needed for mild pain. [provider] Taking Active Child  allopurinol (ZYLOPRIM) 100 MG tablet [id number] Yes TAKE 1 TABLET BY MOUTH EVERY DAY Joaquim Nam, MD Taking Active Child  azithromycin (ZITHROMAX) 500 MG tablet [id number] Yes TAKE 500 MG BY MOUTH  1 HOUR PRIOR TO ANY DENTAL CLEANINGS AND PROCEDURES.  Georgie Chard D, NP Taking Active   clopidogrel (PLAVIX) 75 MG tablet [id number] Yes TAKE 1 TABLET BY MOUTH EVERY DAY Tonny Bollman, MD Taking Active   cyanocobalamin (,VITAMIN B-12,) 1000 MCG/ML injection [id number] Yes IM every 28 days  Patient taking differently: Inject 1,000 mcg into the muscle every 30 (thirty) days.   Joaquim Nam, MD Taking Active Child           Med Note Roxan Hockey, [place] R   Thu Jun 25, 2022  1:54 PM)    diltiazem (CARDIZEM CD) 180 MG 24 hr capsule [id number] Yes Take 1 capsule (180 mg total) by mouth daily. Tonny Bollman, MD Taking Active Child  furosemide (LASIX) 20 MG tablet [id number] Yes Take 2 tablets (40mg ) by mouth on Mondays, Wednesdays, and Fridays only, take 1 tablet (20 mg) all other days Tonny Bollman, MD Taking Active   lisinopril (ZESTRIL) 20 MG tablet [id number] Yes Take 10 mg by mouth in the morning and at bedtime. [provider] Taking Active Child  metoprolol tartrate (LOPRESSOR) 25 MG tablet [id number] Yes Take 0.5 tablets (12.5 mg total) by mouth 2 (two) times daily. Tonny Bollman, MD Taking Active Child  pantoprazole (PROTONIX) 40 MG tablet [id number]  Yes Take 1 tablet (40 mg total) by mouth daily. Georgie Chard D, NP Taking Active   predniSONE (DELTASONE) 10 MG tablet [id number] Yes TAKE 1 TABLET (10 MG TOTAL) BY MOUTH DAILY WITH BREAKFAST.  Patient taking differently: Take 10 mg by mouth daily with breakfast. PRN for gout flares   Nadara Mustard, MD Taking Active   tamsulosin (FLOMAX) 0.4 MG CAPS capsule [id number] Yes TAKE 1 CAPSULE BY MOUTH EVERY DAY Joaquim Nam, MD Taking Active Child  warfarin (COUMADIN) 4 MG tablet [id number] Yes TAKE 1 TO 1 AND 1/2 TABLETS BY MOUTH DAILY OR AS DIRECTED BY COUMADIN CLINIC Tonny Bollman, MD Taking Active             SDOH:  (Social Determinants of Health) assessments and interventions performed: No SDOH Interventions    Flowsheet Row Telephone from 04/16/2022 in [facility] Care Coordination Clinical Support from 11/27/2021 in [facility] Clinical Support from 11/23/2020 in [facility] Chronic Care Management from 07/25/2020 in [facility] Clinical Support from 05/16/2019 in [facility] [place]  SDOH Interventions       Food Insecurity Interventions Intervention Not Indicated Intervention Not Indicated Patient Refused -- --  Housing Interventions Intervention Not Indicated Intervention Not Indicated Patient Refused -- --  Transportation Interventions -- Intervention Not Indicated Patient Refused -- --  Utilities Interventions -- Intervention Not Indicated -- -- --  Alcohol Usage Interventions -- Intervention Not Indicated (Score <7) -- -- --  Depression Interventions/Treatment  -- -- -- -- PHQ2-9 Score <4 Follow-up Not Indicated  Financial Strain Interventions -- Intervention Not Indicated -- Intervention Not Indicated  [Medications affordable] --  Physical Activity Interventions -- Intervention Not Indicated Patient Refused -- --  Stress Interventions -- Intervention Not Indicated Patient Refused -- --  Social Connections Interventions -- Intervention Not Indicated -- -- --       Medication Assistance: None required.  Patient affirms current coverage meets needs.  Medication Access: Within the past 30 days, how often has patient missed a dose of medication? 0 Is a pillbox or other method used to improve adherence? Yes  Factors that may affect medication adherence? no barriers identified Are meds synced by current pharmacy? No  Are meds delivered by current pharmacy? No  Does patient experience delays in picking up medications due to transportation concerns? No   Upstream Services Reviewed: Is patient disadvantaged to use [facility]?: No  Current Rx insurance plan: [employer] Name and location of Current  pharmacy:  [id number] [place], [place] - [address] [place] [address] Phone: [phone] Fax: [phone]  [facility] [address] [place] [address] Phone: [phone] Fax: [phone]  [facility] Pharm[facility] services reviewed with patient today?: No  Patient requests to transfer care to [facility]?: No  Reason patient declined to change pharmacies: Not mentioned at this visit  Compliance/Adherence/Medication fill history: Care Gaps: A1c (due 11/2021) Eye exam (due 05/2020) Foot exam (due 05/2022)  Star-Rating Drugs: Lisinopril - PDC 86%; LF 06/15/22 x 90 ds   ASSESSMENT / PLAN  Hypertension (BP goal <140/90) -Not ideally controlled - BP range 130s-150s/70-80s at home -Current home BP readings: 132/55 77; 128/74 98; 159/78 88; 146/78 80; 138/72 100; 156/72 83; 137/65 80; 153/60 80 -Hx OSA -ECHO 05/18/22 EF 70-75%; diastolic function indeterminate -  Current treatment: Diltiazem CD 180 mg daily - Appropriate, Effective, Safe, Accessible Metoprolol tartrate 25 mg -1/2 tab BID -Appropriate, Effective, Safe, Accessible Lisinopril 20 mg - 1/2 tab daily -Appropriate, Effective, Safe, Accessible Furosemide 20 mg alternating with 40 mg QOD -Appropriate, Effective, Safe, Accessible -Medications previously tried: n/a  -Denies hypotensive/hypertensive symptoms -Educated on BP goals and benefits of medications for prevention of heart attack, stroke and kidney damage; -Counseled to monitor BP at home daily, document, and provide log at future appointments -Recommended to continue current medication  Atrial Fibrillation (Goal: prevent stroke and major bleeding) -Controlled -CHADSVASC: 5; INR stable -Current treatment: Diltiazem CD 180 mg daily -Appropriate, Effective, Safe, Accessible Metoprolol tartrate 25 mg -1/2 tab BID -Appropriate, Effective, Safe, Accessible Warfarin 4 mg AD  -Appropriate, Effective, Safe, Accessible -Medications previously tried: n/a -Counseled on increased risk of stroke due to Afib and benefits of anticoagulation for stroke prevention; bleeding risk associated with warfarin/plavix and importance of self-monitoring for signs/symptoms of bleeding; avoidance of NSAIDs due to increased bleeding risk with anticoagulants; importance of regular laboratory monitoring; -Recommended to continue current medication  Hyperlipidemia / CAD (LDL goal < 70) -Not ideally controlled - LDL 95 (05/2019), statin intolerant -CAD w stents; DES placed 03/11/22 with plan for clopidogrel + warfarin x 6 months, then warfarin monotherapy. 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 -Recommended to continue current medication  Gout (Goal: Prevent gout flares) -Controlled  -Last Gout Flare: 06/2022 -Current treatment  Allopurinol 100 mg daily - Appropriate, Effective, Safe, Accessible Prednisone 10 mg daily PRN (06/25/22 #30) Colchicine 0.6 mg PRN -Medications previously tried: colchicine  -We discussed:  Counseled patient on low purine diet plan. Counseled patient to reduce consumption of high-fructose corn syrup, sweetened soft drinks, fruit juices, meat, and seafood. -Recommended to continue current medication  Health Maintenance -Vaccine gaps: Shingrix, TDAP -GERD w/ esophageal stricture: on pantoprazole 40 mg -BPH: on  tamsulosin 0.4 mg  Al Corpus, PharmD, [place], CPP Clinical Pharmacist Practitioner [facility] at [facility] [phone]

## 2022-07-27 ENCOUNTER — Telehealth: Payer: Self-pay | Admitting: Family Medicine

## 2022-07-27 NOTE — Telephone Encounter (Signed)
Rachael from [facility] called to ask is this patient is a diabetic,because she wasn't informed if he was or not through his paperwork,so when she did his eye exam she didn't do an diabetic eye exam,and they did not want to seem incompliant.   Phone number [phone]

## 2022-07-27 NOTE — Telephone Encounter (Signed)
I dont see DM on his problem list but wanna be sure before I tell her no?

## 2022-07-28 NOTE — Telephone Encounter (Signed)
He has h/o DM2.  Thanks.

## 2022-07-28 NOTE — Telephone Encounter (Signed)
Returned call to [facility]; lmtcb on their VM.

## 2022-07-29 NOTE — Telephone Encounter (Signed)
Called and left message for Fleet Contras to call back

## 2022-07-29 NOTE — Telephone Encounter (Signed)
Spoke with Fleet Contras at [facility] and advised that patient has a hx of DM2. They will document this for next time which will be in Aug 2024.

## 2022-08-09 ENCOUNTER — Other Ambulatory Visit: Payer: Self-pay | Admitting: Cardiovascular Disease

## 2022-08-09 DIAGNOSIS — I482 Chronic atrial fibrillation, unspecified: Secondary | ICD-10-CM

## 2022-08-11 ENCOUNTER — Ambulatory Visit (INDEPENDENT_AMBULATORY_CARE_PROVIDER_SITE_OTHER): Payer: Medicare Other | Admitting: *Deleted

## 2022-08-11 DIAGNOSIS — E538 Deficiency of other specified B group vitamins: Secondary | ICD-10-CM

## 2022-08-11 MED ORDER — CYANOCOBALAMIN 1000 MCG/ML IJ SOLN
1000.0000 ug | Freq: Once | INTRAMUSCULAR | Status: AC
Start: 2022-08-11 — End: 2022-08-11
  Administered 2022-08-11: 1000 ug via INTRAMUSCULAR

## 2022-08-11 NOTE — Progress Notes (Signed)
Per orders of Dr. Duncan, injection of Vitamin B-12 given by Venancio Chenier. Patient tolerated injection well. 

## 2022-08-18 ENCOUNTER — Ambulatory Visit: Payer: Medicare Other | Attending: Cardiovascular Disease | Admitting: *Deleted

## 2022-08-18 DIAGNOSIS — Z952 Presence of prosthetic heart valve: Secondary | ICD-10-CM | POA: Diagnosis not present

## 2022-08-18 DIAGNOSIS — I482 Chronic atrial fibrillation, unspecified: Secondary | ICD-10-CM

## 2022-08-18 LAB — POCT INR: INR: 1.7 — AB (ref 2.0–3.0)

## 2022-08-18 NOTE — Patient Instructions (Addendum)
Description   Today take another 1/2 tablet of warfarin then continue taking warfarin 1 tablet daily except for 1.5 tablets on Sundays and Thursdays. INR in 4 weeks. Call if any new medications or chnges [phone].

## 2022-08-28 ENCOUNTER — Telehealth: Payer: Self-pay | Admitting: Family Medicine

## 2022-08-28 DIAGNOSIS — E119 Type 2 diabetes mellitus without complications: Secondary | ICD-10-CM

## 2022-08-28 DIAGNOSIS — E538 Deficiency of other specified B group vitamins: Secondary | ICD-10-CM

## 2022-08-28 DIAGNOSIS — M109 Gout, unspecified: Secondary | ICD-10-CM

## 2022-08-28 NOTE — Telephone Encounter (Signed)
Patient needs colchichine for gout Needs patient assistance for this medication, he had it once before Can take two daily if needed

## 2022-08-30 NOTE — Telephone Encounter (Signed)
This was previously held because of his renal function.  How often is he having gout?  If it is rare, prednisone may be a better option.  He likely should not take colchicine as a preventive medication.  If he ends up needing prednisone frequently at all, then we need to get him in for an A1c.

## 2022-08-31 ENCOUNTER — Other Ambulatory Visit (HOSPITAL_COMMUNITY): Payer: Self-pay

## 2022-08-31 NOTE — Telephone Encounter (Signed)
Daughter states he has had gout 3 times in the last 5 months. He takes allopurinol as preventive and needs the colchicine PRN for flares. Kidney doctor told them he could take it up to 2 days for flares. States they do need the assistance for this medication.

## 2022-08-31 NOTE — Telephone Encounter (Addendum)
We generally can use colchicine at normal doses if eGFR is >30, and if having that many gout flares, I'd recommend checking a uric acid. If >6, would titrate allopurinol. Can increase in 50 mg increments every ~ 2-4 weeks given his current level of kidney function).   If allopurinol does need to be titrated, would communicate closely with [facility] team managing his warfarin.   Talked with our pharmacy - colchicine tablets #30 is $21 on his insurance.

## 2022-09-01 MED ORDER — COLCHICINE 0.6 MG PO TABS: ORAL_TABLET | ORAL | 1 refills | Status: DC

## 2022-09-01 NOTE — Addendum Note (Signed)
Addended by: Joaquim Nam on: 09/01/2022 06:43 AM   Modules accepted: Orders

## 2022-09-01 NOTE — Telephone Encounter (Signed)
He has f/u pending.  I put in future labs. Colchicine sent.  Thanks.

## 2022-09-01 NOTE — Telephone Encounter (Signed)
LMTCB

## 2022-09-03 NOTE — Telephone Encounter (Signed)
Called patients daughter and reviewed all information. She verbalized understanding. Will call if any further questions.  

## 2022-09-08 ENCOUNTER — Encounter: Payer: Medicare Other | Admitting: Family Medicine

## 2022-09-10 ENCOUNTER — Ambulatory Visit (INDEPENDENT_AMBULATORY_CARE_PROVIDER_SITE_OTHER): Payer: Medicare Other | Admitting: Family Medicine

## 2022-09-10 ENCOUNTER — Encounter: Payer: Self-pay | Admitting: Family Medicine

## 2022-09-10 VITALS — BP 116/60 | HR 70 | Temp 97.6°F | Ht 64.0 in | Wt 208.0 lb

## 2022-09-10 DIAGNOSIS — I10 Essential (primary) hypertension: Secondary | ICD-10-CM | POA: Diagnosis not present

## 2022-09-10 DIAGNOSIS — E119 Type 2 diabetes mellitus without complications: Secondary | ICD-10-CM

## 2022-09-10 DIAGNOSIS — Z0000 Encounter for general adult medical examination without abnormal findings: Secondary | ICD-10-CM

## 2022-09-10 DIAGNOSIS — I482 Chronic atrial fibrillation, unspecified: Secondary | ICD-10-CM | POA: Diagnosis not present

## 2022-09-10 DIAGNOSIS — E538 Deficiency of other specified B group vitamins: Secondary | ICD-10-CM | POA: Diagnosis not present

## 2022-09-10 DIAGNOSIS — E1142 Type 2 diabetes mellitus with diabetic polyneuropathy: Secondary | ICD-10-CM

## 2022-09-10 DIAGNOSIS — M109 Gout, unspecified: Secondary | ICD-10-CM

## 2022-09-10 DIAGNOSIS — Z7189 Other specified counseling: Secondary | ICD-10-CM

## 2022-09-10 LAB — LIPID PANEL
Cholesterol: 189 mg/dL (ref 0–200)
HDL: 27.5 mg/dL — ABNORMAL LOW (ref >39.00)
LDL Cholesterol: 122 mg/dL — ABNORMAL HIGH (ref 0–99)
NonHDL: 161.09
Total CHOL/HDL Ratio: 7
Triglycerides: 193 mg/dL — ABNORMAL HIGH (ref 0.0–149.0)
VLDL: 38.6 mg/dL (ref 0.0–40.0)

## 2022-09-10 LAB — VITAMIN B12: Vitamin B-12: 458 pg/mL (ref 211–911)

## 2022-09-10 LAB — HEMOGLOBIN A1C: Hgb A1c MFr Bld: 6.6 % — ABNORMAL HIGH (ref 4.6–6.5)

## 2022-09-10 LAB — URIC ACID: Uric Acid, Serum: 8.2 mg/dL — ABNORMAL HIGH (ref 4.0–7.8)

## 2022-09-10 MED ORDER — PREDNISONE 10 MG PO TABS: 10.0000 mg | ORAL_TABLET | Freq: Every day | ORAL | 0 refills | Status: DC

## 2022-09-10 NOTE — Progress Notes (Signed)
Vision Screening   Right eye Left eye Both eyes  Without correction 20/25 20/25 20/20   With correction     Hearing Screening - Comments:: Did not pass whisper test

## 2022-09-10 NOTE — Patient Instructions (Signed)
Keep taking colchicine for now.  Go to the lab on the way out.   If you have mychart we'll likely use that to update you.    Take care.  Glad to see you.

## 2022-09-10 NOTE — Progress Notes (Signed)
L thumb pain, some better with colchicine.  Recent flare.  Still on allopurinol.    B12 def on replacement.  Due for labs.    Taking zithromax prior to dental visits.    Still on coumadin.  Statin intolerant.  Still IRR today.  No bleeding.    Hypertension:    Using medication without problems or lightheadedness: yes Chest pain with exertion:no Edema:scant BLE  Short of breath:no  Diabetes:  No meds.  Hypoglycemic episodes: no- cautions re: prolonged fasting.  Hyperglycemic episodes: no Feet problems: no new sx Blood Sugars averaging: 115-120 usually eye exam within last year: due this year, he'll f/u later this year.   Labs pending.   Tetanus 2011 Zostavax prev done.   PNA up to date.  Prev covid vaccine done.  Flu done yearly.  Colon and PSA screening not due given his age.   Daughters Tabatha then [employer] if patient were incapacitated.    Using a cane as preventive measure.  No falls.  Cautions d/w pt.   Meds, vitals, and allergies reviewed.   ROS: Per HPI unless specifically indicated in ROS section   GEN: nad, alert and oriented HEENT: ncat NECK: supple w/o LA CV: IRR, not tachy.  PULM: ctab, no inc wob ABD: soft, +bs EXT: no edema SKIN: no acute rash L thumb with pain at the IP joint.    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

## 2022-09-13 ENCOUNTER — Other Ambulatory Visit: Payer: Self-pay | Admitting: Family Medicine

## 2022-09-13 DIAGNOSIS — Z0000 Encounter for general adult medical examination without abnormal findings: Secondary | ICD-10-CM | POA: Insufficient documentation

## 2022-09-13 DIAGNOSIS — M109 Gout, unspecified: Secondary | ICD-10-CM

## 2022-09-13 DIAGNOSIS — Z7189 Other specified counseling: Secondary | ICD-10-CM | POA: Insufficient documentation

## 2022-09-13 MED ORDER — ALLOPURINOL 100 MG PO TABS: 150.0000 mg | ORAL_TABLET | Freq: Every day | ORAL | Status: DC

## 2022-09-13 NOTE — Assessment & Plan Note (Signed)
Continue metoprolol diltiazem and Lasix.

## 2022-09-13 NOTE — Assessment & Plan Note (Signed)
See notes on labs.  No change in medications today.

## 2022-09-13 NOTE — Assessment & Plan Note (Signed)
See notes on labs.  Continue with replacement. 

## 2022-09-13 NOTE — Assessment & Plan Note (Signed)
Daughters Tabatha then [employer] if patient were incapacitated.

## 2022-09-13 NOTE — Assessment & Plan Note (Signed)
Continue Coumadin as is.  Cautions discussed with patient.  No bleeding.

## 2022-09-13 NOTE — Assessment & Plan Note (Signed)
Discussed using colchicine as needed for now.  We talked about checking his labs today and then considering the change in his allopurinol dose.  See notes on labs.

## 2022-09-13 NOTE — Assessment & Plan Note (Signed)
Tetanus 2011 Zostavax prev done.   PNA up to date.  Prev covid vaccine done.  Flu done yearly.  Colon and PSA screening not due given his age.   Daughters Tabatha then [employer] if patient were incapacitated.

## 2022-09-14 ENCOUNTER — Telehealth: Payer: Self-pay

## 2022-09-14 NOTE — Telephone Encounter (Signed)
Received message below:  Joaquim Nam, MD  [place], Verl Bangs, RN FYI, we are adjusting his allopurinol dose to 150 mg a day.  Thanks.  Pt scheduled for Coumadin Clinic visit on 09/15/22. Note placed on appt.

## 2022-09-15 ENCOUNTER — Ambulatory Visit: Payer: Medicare Other | Attending: Cardiovascular Disease

## 2022-09-15 ENCOUNTER — Ambulatory Visit (INDEPENDENT_AMBULATORY_CARE_PROVIDER_SITE_OTHER): Payer: Medicare Other

## 2022-09-15 DIAGNOSIS — Z952 Presence of prosthetic heart valve: Secondary | ICD-10-CM | POA: Diagnosis not present

## 2022-09-15 DIAGNOSIS — I482 Chronic atrial fibrillation, unspecified: Secondary | ICD-10-CM | POA: Insufficient documentation

## 2022-09-15 DIAGNOSIS — E538 Deficiency of other specified B group vitamins: Secondary | ICD-10-CM

## 2022-09-15 LAB — POCT INR: INR: 2.1 (ref 2.0–3.0)

## 2022-09-15 MED ORDER — CYANOCOBALAMIN 1000 MCG/ML IJ SOLN
1000.0000 ug | Freq: Once | INTRAMUSCULAR | Status: AC
Start: 2022-09-15 — End: 2022-09-15
  Administered 2022-09-15: 1000 ug via INTRAMUSCULAR

## 2022-09-15 NOTE — Progress Notes (Signed)
Per orders of Dr. Graham Duncan, injection of vitamin b 12 given by Bless Lisenby in right deltoid. Patient tolerated injection well. Patient will make appointment for 1 month.   

## 2022-09-15 NOTE — Patient Instructions (Signed)
continue taking warfarin 1 tablet daily except for 1.5 tablets on Sundays and Thursdays. INR in 6 weeks. Call if any new medications or chnges [phone]. Allopurinol may be increased to 150 mg Daily, pt confirming with Dr Para March

## 2022-09-19 ENCOUNTER — Other Ambulatory Visit: Payer: Self-pay | Admitting: Family Medicine

## 2022-09-19 ENCOUNTER — Other Ambulatory Visit: Payer: Self-pay | Admitting: Cardiovascular Disease

## 2022-09-21 ENCOUNTER — Encounter: Payer: Self-pay | Admitting: Cardiovascular Disease

## 2022-09-21 ENCOUNTER — Ambulatory Visit: Payer: Medicare Other | Attending: Cardiovascular Disease | Admitting: Cardiovascular Disease

## 2022-09-21 VITALS — BP 146/70 | HR 90 | Ht 66.0 in | Wt 209.6 lb

## 2022-09-21 DIAGNOSIS — S61402A Unspecified open wound of left hand, initial encounter: Secondary | ICD-10-CM | POA: Diagnosis not present

## 2022-09-21 DIAGNOSIS — N1832 Chronic kidney disease, stage 3b: Secondary | ICD-10-CM | POA: Insufficient documentation

## 2022-09-21 DIAGNOSIS — I2510 Atherosclerotic heart disease of native coronary artery without angina pectoris: Secondary | ICD-10-CM | POA: Insufficient documentation

## 2022-09-21 DIAGNOSIS — Z952 Presence of prosthetic heart valve: Secondary | ICD-10-CM | POA: Diagnosis not present

## 2022-09-21 DIAGNOSIS — I4819 Other persistent atrial fibrillation: Secondary | ICD-10-CM | POA: Diagnosis not present

## 2022-09-21 MED ORDER — LISINOPRIL 10 MG PO TABS: 10.0000 mg | ORAL_TABLET | Freq: Two times a day (BID) | ORAL | 3 refills | Status: DC

## 2022-09-21 MED ORDER — ASPIRIN 81 MG PO TBEC: 81.0000 mg | DELAYED_RELEASE_TABLET | Freq: Every day | ORAL | 3 refills | Status: AC

## 2022-09-21 NOTE — Patient Instructions (Signed)
Medication Instructions:  STOP Clopidogrel/Plavix START Aspirin 81mg  daily *If you need a refill on your cardiac medications before your next appointment, please call your pharmacy*   Lab Work: NONE If you have labs (blood work) drawn today and your tests are completely normal, you will receive your results only by: MyChart Message (if you have MyChart) OR A paper copy in the mail If you have any lab test that is abnormal or we need to change your treatment, we will call you to review the results.  Testing/Procedures: NONE  Follow-Up: At [facility], you and your health needs are our priority.  As part of our continuing mission to provide you with exceptional heart care, we have created designated Provider Care Teams.  These Care Teams include your primary Cardiologist (physician) and Advanced Practice Providers (APPs -  Physician Assistants and Nurse Practitioners) who all work together to provide you with the care you need, when you need it.  Your next appointment:   1 year(s)  Provider:   Tonny Bollman, MD

## 2022-09-21 NOTE — Progress Notes (Signed)
Cardiology Office Note:    Date:  09/21/2022   ID:  [name], DOB 1930-04-12, MRN [id number]  PCP:  Joaquim Nam, MD   [facility] Providers Cardiologist:  Tonny Bollman, MD     Referring MD: Joaquim Nam, MD   Chief Complaint  Patient presents with   Coronary Artery Disease    History of Present Illness:    [name] is a 89 y.o. male presenting for follow-up of coronary artery disease, permanent atrial fibrillation, and aortic valve disease status post TAVR.  TAVR was performed in January 2024.  He is follow-up echo in March showed an LVEF of 70 to 75% with normal valve function, aortic valve mean gradient 7 mmHg and no paravalvular regurgitation.  The patient is here with his daughter today. He's doing pretty well. He denies any functional limitation with his normal activities. No shortness of breath going to the grocery store. No chest pain or pressure. He denies leg edema, orthopnea, or PND. He occasionally has 'gout flares.'   Past Medical History:  Diagnosis Date   Arthritis    Bradycardia    a. nocturnal with pauses overnight on tele likely due to OSA 10/2015.   CKD (chronic kidney disease), stage III (HCC)    Coronary artery disease    a. prev nonobst. b. LHC 10/24/15: occlusion of small diffusely diseased OM2 s/p balloon angioplasty, mild nonobstructive disease of mLAD and mRCA, normal LVEDP   Diabetes mellitus without complication (HCC)    01/02/21 Pt's daughter states patient does not  have diabetes.  A1C 07/23/20 was 7.2, patientr had been on medication.   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 Excell Seltzer & Dr. Leafy Ro   Severe aortic stenosis    Vertigo     Past Surgical History:  Procedure Laterality Date   AMPUTATION Right 03/03/2019   Procedure: RIGHT 2ND TOE AMPUTATION;  Surgeon: Nadara Mustard, MD;  Location: [facility] OR;  Service: Orthopedics;  Laterality: Right;   AMPUTATION Right 01/03/2021   Procedure: RIGHT 3RD TOE AMPUTATION;  Surgeon: Nadara Mustard, MD;  Location: [facility] OR;  Service: Orthopedics;  Laterality: Right;   BALLOON DILATION N/A 10/22/2014   Procedure: BALLOON DILATION;  Surgeon: Iva Boop, MD;  Location: [facility] ENDOSCOPY;  Service: Endoscopy;  Laterality: N/A;   CARDIAC CATHETERIZATION  07/2008   CARDIAC CATHETERIZATION N/A 10/24/2015   Procedure: Left Heart Cath and Coronary Angiography;  Surgeon: Yvonne Kendall, MD;  Location: [facility] INVASIVE CV LAB;  Service: Cardiovascular;  Laterality: N/A;   CARDIAC CATHETERIZATION N/A 10/24/2015   Procedure: Coronary Balloon Angioplasty;  Surgeon: Yvonne Kendall, MD;  Location: [facility] INVASIVE CV LAB;  Service: Cardiovascular;  Laterality: N/A;   Carotid Dopplers  09/2009   no sig extracranial  stenosis and vertebral arteries had antegrade flow   CATARACT EXTRACTION W/ INTRAOCULAR LENS  IMPLANT, BILATERAL Bilateral    CORONARY STENT INTERVENTION N/A 03/11/2022   Procedure: CORONARY STENT INTERVENTION;  Surgeon: Tonny Bollman, MD;  Location: [facility] INVASIVE CV LAB;  Service: Cardiovascular;  Laterality: N/A;   ESOPHAGOGASTRODUODENOSCOPY (EGD) WITH ESOPHAGEAL DILATION     "I've had it stretched 3 times" (10/23/2015)   ESOPHAGOGASTRODUODENOSCOPY (EGD) WITH ESOPHAGEAL DILATION  06/2011   /medical hx above  (09/09/2017)   ESOPHAGOGASTRODUODENOSCOPY (EGD) WITH PROPOFOL N/A 10/22/2014   Procedure:  ESOPHAGOGASTRODUODENOSCOPY (EGD) WITH PROPOFOL;  Surgeon: Iva Boop, MD;  Location: [facility]COPY;  Service: Endoscopy;  Laterality: N/A;   FINGER SURGERY Right 02/1964   "cut little finger off; had it reattached"   I & D EXTREMITY Right 09/10/2017   Procedure: RIGHT FOOT DEBRIDEMENT;  Surgeon: Nadara Mustard, MD;  Location: [facility] OR;  Service: Orthopedics;  Laterality: Right;   INTRAOPERATIVE TRANSTHORACIC ECHOCARDIOGRAM N/A 04/14/2022   Procedure: INTRAOPERATIVE TRANSTHORACIC ECHOCARDIOGRAM;  Surgeon: Tonny Bollman, MD;  Location: [facility] INVASIVE CV LAB;  Service: Open Heart Surgery;  Laterality: N/A;   MRI of brain  09/2009   chronic microvascular ischemia   OSA Sleep Study     per Dr. Vickey Huger   RIGHT/LEFT HEART CATH AND CORONARY ANGIOGRAPHY N/A 03/11/2022   Procedure: RIGHT/LEFT HEART CATH AND CORONARY ANGIOGRAPHY;  Surgeon: Tonny Bollman, MD;  Location: [facility] INVASIVE CV LAB;  Service: Cardiovascular;  Laterality: N/A;   SHOULDER ARTHROSCOPY W/ ROTATOR CUFF REPAIR Right 2008   TRANSCATHETER AORTIC VALVE REPLACEMENT, TRANSFEMORAL Left 04/14/2022   Procedure: Transcatheter Aortic Valve Replacement, Transfemoral;  Surgeon: Tonny Bollman, MD;  Location: [facility] INVASIVE CV LAB;  Service: Open Heart Surgery;  Laterality: Left;    Current Medications: Current Meds  Medication Sig   acetaminophen (TYLENOL) 500 MG tablet Take 500 mg by mouth as needed for mild pain.   allopurinol (ZYLOPRIM) 100 MG tablet Take 1.5 tablets (150 mg total) by mouth daily.   aspirin EC 81 MG tablet Take 1 tablet (81 mg total) by mouth daily. Swallow whole.   azithromycin (ZITHROMAX) 500 MG tablet TAKE 500 MG BY MOUTH  1 HOUR PRIOR TO ANY DENTAL CLEANINGS AND PROCEDURES.   colchicine 0.6 MG tablet Take 1 tab twice a day for 1 day if needed.  Then 1 a day if needed thereafter.  For gout.   cyanocobalamin (,VITAMIN B-12,) 1000 MCG/ML injection IM every 28 days   diltiazem (CARDIZEM CD) 180 MG 24 hr capsule Take 1  capsule (180 mg total) by mouth daily.   furosemide (LASIX) 20 MG tablet Take 2 tablets (40mg ) by mouth on Mondays, Wednesdays, and Fridays only, take 1 tablet (20 mg) all other days   metoprolol tartrate (LOPRESSOR) 25 MG tablet TAKE 0.5 TABLETS BY MOUTH 2 TIMES DAILY.   pantoprazole (PROTONIX) 40 MG tablet Take 1 tablet (40 mg total) by mouth daily.   predniSONE (DELTASONE) 10 MG tablet Take 1 tablet (10 mg total) by mouth daily with breakfast. As needed for gout   tamsulosin (FLOMAX) 0.4 MG CAPS capsule TAKE 1 CAPSULE BY MOUTH EVERY DAY   warfarin (COUMADIN) 4 MG tablet TAKE 1 TO 1 AND 1/2 TABLETS BY MOUTH DAILY OR AS DIRECTED BY COUMADIN CLINIC   [DISCONTINUED] clopidogrel (PLAVIX) 75 MG tablet TAKE 1 TABLET BY MOUTH EVERY DAY   [DISCONTINUED] lisinopril (ZESTRIL) 10 MG tablet Take 10 mg by mouth 2 (two) times daily.   [DISCONTINUED] lisinopril (ZESTRIL) 20 MG  tablet Take 10 mg by mouth in the morning and at bedtime.     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  Tobacco Use   Smoking status: Former    Packs/day: 0.12    Years: 4.00    Additional pack years: 0.00    Total pack years: 0.48    Types: Cigarettes    Quit date: 03/16/1969    Years since quitting: 53.5    Passive exposure: Never   Smokeless tobacco: Former   Tobacco comments:    "chewed when I smoked; smoked 1 pack/week for about 4 years, quit 1971  Vaping Use   Vaping Use: Never used  Substance and Sexual Activity   Alcohol use: Not Currently    Alcohol/week: 0.0 standard drinks of alcohol    Comment:  09/09/2017  "Quit all alcohol in 1970's"   Drug use: Never   Sexual activity: Not on file  Other Topics Concern   Not on file  Social History Narrative   Lives in [place], married 1953, widowed after 62 years   Retired Naval architect, still does Aeronautical engineer.   He is not routinely exercising.   Daily caffeine    Social Determinants of Health   Financial Resource Strain: Low Risk  (11/27/2021)   Overall Financial Resource Strain (CARDIA)    Difficulty of Paying Living Expenses: Not hard at all  Food Insecurity: No Food Insecurity (04/16/2022)   Hunger Vital Sign    Worried About Running Out of Food in the Last Year: Never true    Ran Out of Food in the Last Year: Never true  Transportation Needs: No Transportation Needs (11/27/2021)   PRAPARE - Administrator, Civil Service (Medical): No    Lack of Transportation (Non-Medical): No  Physical Activity: Insufficiently Active (11/27/2021)   Exercise Vital Sign    Days of Exercise per Week: 7 days    Minutes of Exercise per Session: 20 min  Stress: No Stress Concern Present (11/27/2021)   [employer] of Occupational Health - Occupational Stress Questionnaire    Feeling of Stress : Not at all  Social Connections: Moderately Integrated (11/27/2021)   Social Connection and Isolation Panel [NHANES]    Frequency of Communication with Friends and Family: Twice a week    Frequency of Social Gatherings with Friends and Family: Twice a week    Attends Religious Services: 1 to 4 times per year    Active Member of [employer] or Organizations: No    Attends Engineer, structural: More than 4 times per year    Marital Status: Widowed     Family History: The patient's family history includes Dementia in his father; Heart disease in his brother; Hypertension in his father and another family member; Pneumonia in his father; Sleep apnea in his brother and daughter; Stomach cancer in his mother. There is no history of Prostate cancer or  Colon cancer.  ROS:   Please see the history of present illness.    All other systems reviewed and are negative.  EKGs/Labs/Other Studies Reviewed:    The following  studies were reviewed today: Echo reviewed as per HPI      Recent Labs: 04/10/2022: ALT 22 04/15/2022: Magnesium 2.2 06/17/2022: B Natriuretic Peptide 216.0; Hemoglobin 13.8; Platelets 173 07/21/2022: BUN 23; Creatinine, Ser 1.73; Potassium 4.6; Sodium 138  Recent Lipid Panel    Component Value Date/Time   CHOL 189 09/10/2022 1043   TRIG 193.0 (H) 09/10/2022 1043   HDL 27.50 (L) 09/10/2022 1043   CHOLHDL 7 09/10/2022 1043   VLDL 38.6 09/10/2022 1043   LDLCALC 122 (H) 09/10/2022 1043   LDLDIRECT 95.0 05/16/2019 0923     Risk Assessment/Calculations:    CHA2DS2-VASc Score = 6   This indicates a 9.7% annual risk of stroke. The patient's score is based upon: CHF History: 1 HTN History: 1 Diabetes History: 1 Stroke History: 0 Vascular Disease History: 1 Age Score: 2 Gender Score: 0           Physical Exam:    VS:  BP (!) 146/70   Pulse 90   Ht 5\' 6"  (1.676 m)   Wt 209 lb 9.6 oz (95.1 kg)   SpO2 98%   BMI 33.83 kg/m     Wt Readings from Last 3 Encounters:  09/21/22 209 lb 9.6 oz (95.1 kg)  09/10/22 208 lb (94.3 kg)  06/25/22 209 lb 3.2 oz (94.9 kg)     GEN:  Well nourished, well developed obese elderly male in no acute distress HEENT: Normal NECK: No JVD; No carotid bruits LYMPHATICS: No lymphadenopathy CARDIAC: Irregularly irregular, 2/6 ejection murmur at the right upper sternal border, no diastolic murmur RESPIRATORY:  Clear to auscultation without rales, wheezing or rhonchi  ABDOMEN: Soft, non-tender, non-distended MUSCULOSKELETAL: Trace bilateral pretibial edema; No deformity  SKIN: Warm and dry NEUROLOGIC:  Alert and oriented x 3 PSYCHIATRIC:  Normal affect   ASSESSMENT:    1. S/P TAVR (transcatheter aortic valve replacement)   2. Persistent atrial fibrillation (HCC)   3. Coronary  artery disease involving native coronary artery of native heart without angina pectoris   4. Stage 3b chronic kidney disease (HCC)    PLAN:    In order of problems listed above:  Patient with normal TAVR bioprosthetic function on recent echocardiogram.  He should follow SBE prophylaxis per guideline recommendations. Continue oral anticoagulation with warfarin.  Heart rate remains controlled on diltiazem. No anginal symptoms at present.  Will discontinue clopidogrel now that he is greater than 6 months out from PCI.  He will start back on aspirin 81 mg daily. Recent labs reviewed with a creatinine of 1.73, normal potassium 4.6.  Creatinine is stable from most recent labs in April of this year.  Continue lisinopril 10 mg daily and diltiazem CD1 180 mg daily for treatment of his hypertension.           Medication Adjustments/Labs and Tests Ordered: Current medicines are reviewed at length with the patient today.  Concerns regarding medicines are outlined above.  No orders of the defined types were placed in this encounter.  Meds ordered this encounter  Medications   aspirin EC 81 MG tablet    Sig: Take 1 tablet (81 mg total) by mouth daily. Swallow whole.    Dispense:  90 tablet    Refill:  3   lisinopril (ZESTRIL) 10 MG tablet    Sig: Take 1 tablet (10 mg total) by mouth 2 (two) times daily.    Dispense:  180 tablet    Refill:  3    Dose adjustment  Patient Instructions  Medication Instructions:  STOP Clopidogrel/Plavix START Aspirin 81mg  daily *If you need a refill on your cardiac medications before your next appointment, please call your pharmacy*   Lab Work: NONE If you have labs (blood work) drawn today and your tests are completely normal, you will receive your results only by: MyChart Message (if you have MyChart) OR A paper copy in the mail If you have any lab test that is abnormal or we need to change your treatment, we will call you to review the  results.  Testing/Procedures: NONE  Follow-Up: At [facility], you and your health needs are our priority.  As part of our continuing mission to provide you with exceptional heart care, we have created designated Provider Care Teams.  These Care Teams include your primary Cardiologist (physician) and Advanced Practice Providers (APPs -  Physician Assistants and Nurse Practitioners) who all work together to provide you with the care you need, when you need it.  Your next appointment:   1 year(s)  Provider:   Tonny Bollman, MD        Signed, Tonny Bollman, MD  09/21/2022 2:21 PM    [facility]

## 2022-09-25 ENCOUNTER — Ambulatory Visit: Payer: Medicare Other | Admitting: Family Medicine

## 2022-09-25 ENCOUNTER — Other Ambulatory Visit: Payer: Self-pay | Admitting: Family Medicine

## 2022-09-28 ENCOUNTER — Encounter: Payer: Self-pay | Admitting: Family Medicine

## 2022-10-07 ENCOUNTER — Ambulatory Visit: Payer: Medicare Other | Admitting: Family

## 2022-10-07 ENCOUNTER — Encounter: Payer: Self-pay | Admitting: Family

## 2022-10-07 DIAGNOSIS — M109 Gout, unspecified: Secondary | ICD-10-CM | POA: Diagnosis not present

## 2022-10-07 NOTE — Progress Notes (Signed)
Office Visit Note   Patient: [name]           Date of Birth: [birth year]           MRN: [id number] Visit Date: 10/07/2022              Requested by: Joaquim Nam, MD [address] [place],  [place] [address] PCP: Joaquim Nam, MD  Chief Complaint  Patient presents with   Left Foot - Pain    C/o gout attack. Last Uric Acid was 8.2 on 09/10/2022 with PCP Allopurinol adjusted to 150 mg every day and states given steroid taper    Left Hand - Pain      HPI: Patient is an 89 year old gentleman who presents today for evaluation of left ankle pain as well as left long finger pain.  Does have a history of gout and currently has been having difficulty with a gout flare.  Back in June his primary care provider increased his allopurinol to 150 mg since he is had difficulty controlling his gout pain had subsequent swelling to the left ankle which was quite concerning for him.  Last night was having increasing pain swelling and redness however he did begin taking a course of prednisone yesterday.  This morning his swelling and redness are much improved he is not having any pain this morning  Assessment & Plan: Visit Diagnoses: No diagnosis found.  Plan: He will complete his course of prednisone as prescribed.  Continue 150 mg allopurinol.  Recommended rechecking his uric acid in 1 month he may schedule for a nurse draw for for [place] to do this.  Follow-up as needed  Follow-Up Instructions: No follow-ups on file.   Ortho Exam  Patient is alert, oriented, no adenopathy, well-dressed, normal affect, normal respiratory effort. On examination of the left lower extremity there is trace edema of the lower leg he does have significant swelling in the ankle there is no effusion.  There is no warmth or erythema.  Findings consistent with gout  Imaging: No results found. No images are attached to the encounter.  Labs: Lab Results  Component Value Date   HGBA1C 6.6 (H) 09/10/2022    HGBA1C 6.8 (H) 05/16/2021   HGBA1C 7.2 (H) 07/23/2020   ESRSEDRATE 17 (H) 09/09/2017   CRP 1.1 (H) 09/09/2017   LABURIC 8.2 (H) 09/10/2022   LABURIC 7.1 05/16/2021   LABURIC 7.6 12/16/2020   REPTSTATUS 09/19/2017 FINAL 09/14/2017   GRAMSTAIN  09/10/2017    MODERATE WBC PRESENT,BOTH PMN AND MONONUCLEAR NO ORGANISMS SEEN    CULT  09/14/2017    NO GROWTH 5 DAYS Performed at [facility], [address], [place], [place] [address]      Lab Results  Component Value Date   ALBUMIN 4.1 04/10/2022   ALBUMIN 4.0 09/17/2020   ALBUMIN 4.2 07/23/2020    Lab Results  Component Value Date   MG 2.2 04/15/2022   MG 2.2 09/09/2017   No results found for: "VD25OH"  No results found for: "PREALBUMIN"    Latest Ref Rng & Units 06/17/2022   10:29 PM 04/15/2022    2:40 AM 04/14/2022    3:02 PM  CBC EXTENDED  WBC 4.0 - 10.5 K/uL 8.5  7.7    RBC 4.22 - 5.81 MIL/uL 4.81  5.17    Hemoglobin 13.0 - 17.0 g/dL 21.3  08.6  57.8   HCT 39.0 - 52.0 % 41.6  44.6  44.0  Platelets 150 - 400 K/uL 173  115       There is no height or weight on file to calculate BMI.  Orders:  No orders of the defined types were placed in this encounter.  No orders of the defined types were placed in this encounter.    Procedures: No procedures performed  Clinical Data: No additional findings.  ROS:  All other systems negative, except as noted in the HPI. Review of Systems  Objective: Vital Signs: There were no vitals taken for this visit.  Specialty Comments:  No specialty comments available.  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   Severe aortic stenosis 03/11/2022   Sinusitis 07/27/2021   PND (post-nasal drip) 07/17/2021   Obstructive sleep apnea treated with bilevel positive airway pressure (BiPAP) 03/19/2021   Lower  urinary tract symptoms (LUTS) 09/16/2020   Gouty arthritis of left foot 06/20/2020   Diabetic peripheral neuropathy (HCC) 10/04/2019   B12 deficiency 05/17/2019   Idiopathic chronic gout, unspecified site, without tophus (tophi) 10/06/2017   CAD S/P percutaneous coronary angioplasty    Chronic atrial fibrillation (HCC)    CKD (chronic kidney disease), stage III (HCC)    Hypertension    Hyperlipidemia    ETD (eustachian tube dysfunction) 05/24/2015   Chronic anticoagulation 09/25/2014   Esophageal stricture from GERD 06/08/2011   OSA on CPAP 09/22/2010   Gout 08/15/2008   OBESITY 08/15/2008   Essential hypertension 08/15/2008   Coronary artery disease with exertional angina (HCC) 08/15/2008   GASTROESOPHAGEAL REFLUX DISEASE 08/15/2008   Past Medical History:  Diagnosis Date   Arthritis    Bradycardia    a. nocturnal with pauses overnight on tele likely due to OSA 10/2015.   CKD (chronic kidney disease), stage III (HCC)    Coronary artery disease    a. prev nonobst. b. LHC 10/24/15: occlusion of small diffusely diseased OM2 s/p balloon angioplasty, mild nonobstructive disease of mLAD and mRCA, normal LVEDP   Diabetes mellitus without complication (HCC)    01/02/21 Pt's daughter states patient does not  have diabetes.  A1C 07/23/20 was 7.2, patientr had been on medication.   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 Excell Seltzer & Dr. Leafy Ro   Severe aortic stenosis    Vertigo     Family History  Problem Relation Age of Onset   Stomach cancer Mother        died in 53's    Hypertension Father        died in his 7's pna likely dementia   Dementia Father    Pneumonia Father    Heart disease Brother  S/P CABG   Sleep apnea Brother    Sleep apnea Daughter    Hypertension Other    Prostate cancer Neg Hx    Colon cancer Neg Hx     Past Surgical History:  Procedure Laterality Date   AMPUTATION Right 03/03/2019   Procedure: RIGHT 2ND TOE AMPUTATION;  Surgeon: Nadara Mustard, MD;  Location: [facility] OR;  Service: Orthopedics;  Laterality: Right;   AMPUTATION Right 01/03/2021   Procedure: RIGHT 3RD TOE AMPUTATION;  Surgeon: Nadara Mustard, MD;  Location: [facility] OR;  Service: Orthopedics;  Laterality: Right;   BALLOON DILATION N/A 10/22/2014   Procedure: BALLOON DILATION;  Surgeon: Iva Boop, MD;  Location: [facility] ENDOSCOPY;  Service: Endoscopy;  Laterality: N/A;   CARDIAC CATHETERIZATION  07/2008   CARDIAC CATHETERIZATION N/A 10/24/2015   Procedure: Left Heart Cath and Coronary Angiography;  Surgeon: Yvonne Kendall, MD;  Location: [facility] INVASIVE CV LAB;  Service: Cardiovascular;  Laterality: N/A;   CARDIAC CATHETERIZATION N/A 10/24/2015   Procedure: Coronary Balloon Angioplasty;  Surgeon: Yvonne Kendall, MD;  Location: [facility] INVASIVE CV LAB;  Service: Cardiovascular;  Laterality: N/A;   Carotid Dopplers  09/2009   no sig extracranial stenosis and vertebral arteries had antegrade flow   CATARACT EXTRACTION W/ INTRAOCULAR LENS  IMPLANT, BILATERAL Bilateral    CORONARY STENT INTERVENTION N/A 03/11/2022   Procedure: CORONARY STENT INTERVENTION;  Surgeon: Tonny Bollman, MD;  Location: [facility] INVASIVE CV LAB;  Service: Cardiovascular;  Laterality: N/A;   ESOPHAGOGASTRODUODENOSCOPY (EGD) WITH ESOPHAGEAL DILATION     "I've had it stretched 3 times" (10/23/2015)   ESOPHAGOGASTRODUODENOSCOPY (EGD) WITH  ESOPHAGEAL DILATION  06/2011   /medical hx above  (09/09/2017)   ESOPHAGOGASTRODUODENOSCOPY (EGD) WITH PROPOFOL N/A 10/22/2014   Procedure: ESOPHAGOGASTRODUODENOSCOPY (EGD) WITH PROPOFOL;  Surgeon: Iva Boop, MD;  Location: [facility];  Service: Endoscopy;  Laterality: N/A;   FINGER SURGERY Right 02/1964   "cut little finger off; had it reattached"   I & D EXTREMITY Right 09/10/2017   Procedure: RIGHT FOOT DEBRIDEMENT;  Surgeon: Nadara Mustard, MD;  Location: [facility] OR;  Service: Orthopedics;  Laterality: Right;   INTRAOPERATIVE TRANSTHORACIC ECHOCARDIOGRAM N/A 04/14/2022   Procedure: INTRAOPERATIVE TRANSTHORACIC ECHOCARDIOGRAM;  Surgeon: Tonny Bollman, MD;  Location: [facility] INVASIVE CV LAB;  Service: Open Heart Surgery;  Laterality: N/A;   MRI of brain  09/2009   chronic microvascular ischemia   OSA Sleep Study     per Dr. Vickey Huger   RIGHT/LEFT HEART CATH AND CORONARY ANGIOGRAPHY N/A 03/11/2022   Procedure: RIGHT/LEFT HEART CATH AND CORONARY ANGIOGRAPHY;  Surgeon: Tonny Bollman, MD;  Location: [facility] INVASIVE CV LAB;  Service: Cardiovascular;  Laterality: N/A;   SHOULDER ARTHROSCOPY W/ ROTATOR CUFF REPAIR Right 2008   TRANSCATHETER AORTIC VALVE REPLACEMENT, TRANSFEMORAL Left 04/14/2022   Procedure: Transcatheter Aortic Valve Replacement, Transfemoral;  Surgeon: Tonny Bollman, MD;  Location: [facility] INVASIVE CV LAB;  Service: Open Heart Surgery;  Laterality: Left;   Social History   Occupational History   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  Tobacco Use   Smoking status: Former    Current packs/day: 0.00    Average packs/day: 0.1 packs/day for 4.0 years (0.5 ttl pk-yrs)    Types: Cigarettes    Start date: 03/16/1965    Quit date: 03/16/1969    Years since quitting: 53.5    Passive exposure: Never   Smokeless tobacco: Former   Tobacco comments:    "chewed when I smoked; smoked 1 pack/week for  about 4 years, quit 1971  Vaping Use   Vaping status: Never Used   Substance and Sexual Activity   Alcohol use: Not Currently    Alcohol/week: 0.0 standard drinks of alcohol    Comment: 09/09/2017  "Quit all alcohol in 1970's"   Drug use: Never   Sexual activity: Not on file

## 2022-10-11 ENCOUNTER — Other Ambulatory Visit: Payer: Self-pay | Admitting: Family Medicine

## 2022-10-20 ENCOUNTER — Ambulatory Visit: Payer: Medicare Other

## 2022-10-23 ENCOUNTER — Encounter: Payer: Self-pay | Admitting: Family Medicine

## 2022-10-23 ENCOUNTER — Ambulatory Visit (INDEPENDENT_AMBULATORY_CARE_PROVIDER_SITE_OTHER): Payer: Medicare Other | Admitting: Family Medicine

## 2022-10-23 VITALS — BP 124/82 | HR 87 | Temp 98.1°F | Ht 66.0 in | Wt 208.0 lb

## 2022-10-23 DIAGNOSIS — J019 Acute sinusitis, unspecified: Secondary | ICD-10-CM | POA: Diagnosis not present

## 2022-10-23 DIAGNOSIS — K219 Gastro-esophageal reflux disease without esophagitis: Secondary | ICD-10-CM | POA: Diagnosis not present

## 2022-10-23 DIAGNOSIS — M109 Gout, unspecified: Secondary | ICD-10-CM

## 2022-10-23 MED ORDER — PREDNISONE 10 MG PO TABS
10.0000 mg | ORAL_TABLET | Freq: Every day | ORAL | 0 refills | Status: DC
Start: 2022-10-23 — End: 2023-02-08

## 2022-10-23 MED ORDER — AZITHROMYCIN 250 MG PO TABS
ORAL_TABLET | ORAL | 0 refills | Status: DC
Start: 2022-10-23 — End: 2023-02-08

## 2022-10-23 NOTE — Patient Instructions (Signed)
Start zithromax and check with the coumadin clinic.  Prednisone with food.  Update me as needed. Take care.  Glad to see you.

## 2022-10-23 NOTE — Progress Notes (Signed)
Slight cough; sinus pressure and pressure in head x at least a couple of weeks. Has been taking claritin and benadryl.  Some post nasal gtt and throat clearing.  BP normal at home but had episodic vertigo when laying down or with neck extension.    L foot pain.  Not on prednisone current but used for a gout flare about 2 weeks ago.  More pain as the day goes on.  No trauma, no pop or snap.  He had tried new shoes and then the pain started.    GERD controlled with protonix.    Meds, vitals, and allergies reviewed.   ROS: Per HPI unless specifically indicated in ROS section   Nad ncat R frontal sinus ttp, sinuses not tender to palpation otherwise. Nasal exam stuffy.  TM wnl B MMM Neck supple. Ctab IRR, not tachycardic L ankle puffy laterally.  Not red.  1+ BLE edema.  Skin well-perfused

## 2022-10-25 DIAGNOSIS — J019 Acute sinusitis, unspecified: Secondary | ICD-10-CM | POA: Insufficient documentation

## 2022-10-25 NOTE — Assessment & Plan Note (Signed)
Controlled with Protonix and I would continue as is.

## 2022-10-25 NOTE — Assessment & Plan Note (Signed)
Presumed, okay for outpatient follow-up. Start zithromax and I advised him to check with the coumadin clinic.  Prednisone with food.  Steroid cautions discussed with patient. Update me as needed.

## 2022-10-25 NOTE — Assessment & Plan Note (Signed)
He is going to start prednisone anyway and he can see if this helps with his left ankle/foot pain.  He can update me as needed.

## 2022-10-27 ENCOUNTER — Ambulatory Visit: Payer: Medicare Other

## 2022-10-27 DIAGNOSIS — Z952 Presence of prosthetic heart valve: Secondary | ICD-10-CM | POA: Insufficient documentation

## 2022-10-27 DIAGNOSIS — I482 Chronic atrial fibrillation, unspecified: Secondary | ICD-10-CM | POA: Diagnosis not present

## 2022-10-27 LAB — POCT INR: INR: 2.3 (ref 2.0–3.0)

## 2022-10-27 NOTE — Patient Instructions (Signed)
continue taking warfarin 1 tablet daily except for 1.5 tablets on Sundays and Thursdays. INR in 6 weeks. Call if any new medications or chnges [phone]. Allopurinol may be increased to 150 mg Daily, pt confirming with Dr Para March

## 2022-10-29 ENCOUNTER — Encounter (INDEPENDENT_AMBULATORY_CARE_PROVIDER_SITE_OTHER): Payer: Self-pay

## 2022-10-30 ENCOUNTER — Other Ambulatory Visit: Payer: Self-pay | Admitting: Cardiovascular Disease

## 2022-11-11 ENCOUNTER — Other Ambulatory Visit: Payer: Self-pay | Admitting: Cardiovascular Disease

## 2022-11-11 DIAGNOSIS — I482 Chronic atrial fibrillation, unspecified: Secondary | ICD-10-CM

## 2022-11-11 NOTE — Telephone Encounter (Signed)
Warfarin 4mg  refill Afib, TAVR Last INR 10/27/22 Last OV 09/21/22

## 2022-11-19 DIAGNOSIS — M170 Bilateral primary osteoarthritis of knee: Secondary | ICD-10-CM | POA: Diagnosis not present

## 2022-11-26 ENCOUNTER — Ambulatory Visit (INDEPENDENT_AMBULATORY_CARE_PROVIDER_SITE_OTHER): Payer: Medicare Other

## 2022-11-26 ENCOUNTER — Telehealth: Payer: Self-pay | Admitting: Family Medicine

## 2022-11-26 ENCOUNTER — Ambulatory Visit: Payer: Medicare Other | Admitting: Family Medicine

## 2022-11-26 DIAGNOSIS — E538 Deficiency of other specified B group vitamins: Secondary | ICD-10-CM

## 2022-11-26 MED ORDER — CYANOCOBALAMIN 1000 MCG/ML IJ SOLN
1000.0000 ug | Freq: Once | INTRAMUSCULAR | Status: AC
Start: 2022-11-26 — End: 2022-11-26
  Administered 2022-11-26: 1000 ug via INTRAMUSCULAR

## 2022-11-26 NOTE — Progress Notes (Signed)
Per orders of Dr. Graham Duncan, injection of vitamin b 12 given by Rena Isley in left deltoid. Patient tolerated injection well. Patient will make appointment for 1 month.   

## 2022-11-26 NOTE — Telephone Encounter (Signed)
Patient would like a look into the 50 dollar charge that they received for A "NO SHOW" for July 12 appointment with Dr Para March.Stated that the appointment was supposed  to have been canceled

## 2022-11-30 ENCOUNTER — Ambulatory Visit (INDEPENDENT_AMBULATORY_CARE_PROVIDER_SITE_OTHER): Payer: Medicare Other

## 2022-11-30 VITALS — Ht 66.0 in | Wt 212.0 lb

## 2022-11-30 DIAGNOSIS — Z0000 Encounter for general adult medical examination without abnormal findings: Secondary | ICD-10-CM

## 2022-11-30 NOTE — Patient Instructions (Signed)
[name] , Thank you for taking time to come for your Medicare Wellness Visit. I appreciate your ongoing commitment to your health goals. Please review the following plan we discussed and let me know if I can assist you in the future.   Referrals/Orders/Follow-Ups/Clinician Recommendations: Aim for 30 minutes of exercise or brisk walking, 6-8 glasses of water, and 5 servings of fruits and vegetables each day.   This is a list of the screening recommended for you and due dates:  Health Maintenance  Topic Date Due   DTaP/Tdap/Td vaccine (2 - Tdap) 09/20/2019   Medicare Annual Wellness Visit  11/28/2022   Eye exam for diabetics  11/04/2022   Flu Shot  06/14/2023*   Zoster (Shingles) Vaccine (1 of 2) 09/13/2023*   Hemoglobin A1C  03/12/2023   Complete foot exam   09/10/2023   Pneumonia Vaccine  Completed   HPV Vaccine  Aged Out   COVID-19 Vaccine  Discontinued  *Topic was postponed. The date shown is not the original due date.    Advanced directives: (Declined) Advance directive discussed with you today. Even though you declined this today, please call our office should you change your mind, and we can give you the proper paperwork for you to fill out.  Next Medicare Annual Wellness Visit scheduled for next year: Yes

## 2022-11-30 NOTE — Progress Notes (Signed)
Subjective:   [name] is a 89 y.o. male who presents for Medicare Annual/Subsequent preventive examination.  Visit Complete: Virtual  I connected with  Philipp Deputy on 11/30/22 by a audio enabled telemedicine application and verified that I am speaking with the correct person using two identifiers.  Patient Location: Home  Provider Location: Home Office  I discussed the limitations of evaluation and management by telemedicine. The patient expressed understanding and agreed to proceed.  Vital Signs: Because this visit was a virtual/telehealth visit, some criteria may be missing or patient reported. Any vitals not documented were not able to be obtained and vitals that have been documented are patient reported.    Cardiac Risk Factors include: advanced age (>16men, >71 women);obesity (BMI >30kg/m2);hypertension;male gender;dyslipidemia     Objective:    Today's Vitals   11/30/22 1119  Weight: 212 lb (96.2 kg)  Height: 5\' 6"  (1.676 m)   Body mass index is 34.22 kg/m.     11/30/2022   11:27 AM 04/14/2022    9:43 AM 03/11/2022    6:35 AM 11/27/2021   11:07 AM 01/03/2021   10:26 AM 05/16/2019    2:44 PM 02/17/2019   10:21 AM  Advanced Directives  Does Patient Have a Medical Advance Directive? No No No No Yes No Yes  Type of Agricultural consultant;Living will  Healthcare Power of Attorney  Does patient want to make changes to medical advance directive?     No - Patient declined  No - Patient declined  Copy of Healthcare Power of Attorney in Chart?     No - copy requested    Would patient like information on creating a medical advance directive? No - Patient declined No - Patient declined No - Patient declined No - Patient declined  No - Patient declined     Current Medications (verified) Outpatient Encounter Medications as of 11/30/2022  Medication Sig   acetaminophen (TYLENOL) 500 MG tablet Take 500 mg by mouth as needed for mild pain.    allopurinol (ZYLOPRIM) 100 MG tablet Take 1.5 tablets (150 mg total) by mouth daily.   aspirin EC 81 MG tablet Take 1 tablet (81 mg total) by mouth daily. Swallow whole.   colchicine 0.6 MG tablet TAKE 1 TAB TWICE A DAY FOR 1 DAY IF NEEDED. THEN 1 A DAY IF NEEDED THEREAFTER. FOR GOUT.   cyanocobalamin (,VITAMIN B-12,) 1000 MCG/ML injection IM every 28 days   diltiazem (CARDIZEM CD) 180 MG 24 hr capsule TAKE 1 CAPSULE BY MOUTH EVERY DAY   furosemide (LASIX) 20 MG tablet Take 2 tablets (40mg ) by mouth on Mondays, Wednesdays, and Fridays only, take 1 tablet (20 mg) all other days   lisinopril (ZESTRIL) 10 MG tablet Take 1 tablet (10 mg total) by mouth 2 (two) times daily.   metoprolol tartrate (LOPRESSOR) 25 MG tablet TAKE 0.5 TABLETS BY MOUTH 2 TIMES DAILY.   pantoprazole (PROTONIX) 40 MG tablet Take 1 tablet (40 mg total) by mouth daily.   predniSONE (DELTASONE) 10 MG tablet Take 1 tablet (10 mg total) by mouth daily with breakfast.   tamsulosin (FLOMAX) 0.4 MG CAPS capsule TAKE 1 CAPSULE BY MOUTH EVERY DAY   warfarin (COUMADIN) 4 MG tablet TAKE 1 TO 1 AND 1/2 TABLETS BY MOUTH DAILY OR AS DIRECTED BY COUMADIN CLINIC   azithromycin (ZITHROMAX Z-PAK) 250 MG tablet Take 2 tablets (500 mg) PO today, then 1 tablet (250 mg) PO daily  x4 days. (Patient not taking: Reported on 11/30/2022)   azithromycin (ZITHROMAX) 500 MG tablet TAKE 500 MG BY MOUTH  1 HOUR PRIOR TO ANY DENTAL CLEANINGS AND PROCEDURES. (Patient not taking: Reported on 11/30/2022)   No facility-administered encounter medications on file as of 11/30/2022.    Allergies (verified) Penicillins, Atorvastatin, Pravastatin, Atrovent nasal spray [ipratropium], Hydrocodone, Nsaids, Uloric [febuxostat], Vancomycin, Clarithromycin, and Doxycycline   History: Past Medical History:  Diagnosis Date   Arthritis    Bradycardia    a. nocturnal with pauses overnight on tele likely due to OSA 10/2015.   CKD (chronic kidney disease), stage III (HCC)     Coronary artery disease    a. prev nonobst. b. LHC 10/24/15: occlusion of small diffusely diseased OM2 s/p balloon angioplasty, mild nonobstructive disease of mLAD and mRCA, normal LVEDP   Diabetes mellitus without complication (HCC)    01/02/21 Pt's daughter states patient does not  have diabetes.  A1C 07/23/20 was 7.2, patientr had been on medication.   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 Excell Seltzer & Dr. Leafy Ro   Severe aortic stenosis    Vertigo    Past Surgical History:  Procedure Laterality Date   AMPUTATION Right 03/03/2019   Procedure: RIGHT 2ND TOE AMPUTATION;  Surgeon: Nadara Mustard, MD;  Location: [facility] OR;  Service: Orthopedics;  Laterality: Right;   AMPUTATION Right 01/03/2021   Procedure: RIGHT 3RD TOE AMPUTATION;  Surgeon: Nadara Mustard, MD;  Location: [facility] OR;  Service: Orthopedics;  Laterality: Right;   BALLOON DILATION N/A 10/22/2014   Procedure: BALLOON DILATION;  Surgeon: Iva Boop, MD;  Location: [facility] ENDOSCOPY;  Service: Endoscopy;  Laterality: N/A;   CARDIAC CATHETERIZATION  07/2008   CARDIAC CATHETERIZATION N/A 10/24/2015   Procedure: Left Heart Cath and Coronary Angiography;  Surgeon: Yvonne Kendall, MD;  Location: [facility] INVASIVE CV LAB;  Service: Cardiovascular;  Laterality: N/A;   CARDIAC CATHETERIZATION N/A 10/24/2015   Procedure: Coronary Balloon Angioplasty;  Surgeon:  Yvonne Kendall, MD;  Location: [facility] INVASIVE CV LAB;  Service: Cardiovascular;  Laterality: N/A;   Carotid Dopplers  09/2009   no sig extracranial stenosis and vertebral arteries had antegrade flow   CATARACT EXTRACTION W/ INTRAOCULAR LENS  IMPLANT, BILATERAL Bilateral    CORONARY STENT INTERVENTION N/A 03/11/2022   Procedure: CORONARY STENT INTERVENTION;  Surgeon: Tonny Bollman, MD;  Location: [facility] INVASIVE CV LAB;  Service: Cardiovascular;  Laterality: N/A;   ESOPHAGOGASTRODUODENOSCOPY (EGD) WITH ESOPHAGEAL DILATION     "I've had it stretched 3 times" (10/23/2015)   ESOPHAGOGASTRODUODENOSCOPY (EGD) WITH ESOPHAGEAL DILATION  06/2011   /medical hx above  (09/09/2017)   ESOPHAGOGASTRODUODENOSCOPY (EGD) WITH PROPOFOL N/A 10/22/2014   Procedure: ESOPHAGOGASTRODUODENOSCOPY (EGD) WITH PROPOFOL;  Surgeon: Iva Boop, MD;  Location: [facility] ENDOSCOPY;  Service: Endoscopy;  Laterality: N/A;   FINGER SURGERY Right 02/1964   "cut little finger off; had it reattached"   I & D EXTREMITY Right 09/10/2017   Procedure: RIGHT FOOT DEBRIDEMENT;  Surgeon: Nadara Mustard, MD;  Location: [facility] OR;  Service: Orthopedics;  Laterality: Right;   INTRAOPERATIVE TRANSTHORACIC ECHOCARDIOGRAM N/A 04/14/2022   Procedure: INTRAOPERATIVE TRANSTHORACIC ECHOCARDIOGRAM;  Surgeon: Tonny Bollman, MD;  Location: [facility] INVASIVE CV LAB;  Service: Open Heart Surgery;  Laterality: N/A;   MRI of brain  09/2009   chronic microvascular ischemia   OSA Sleep Study     per Dr. Vickey Huger   RIGHT/LEFT HEART CATH AND CORONARY ANGIOGRAPHY N/A 03/11/2022   Procedure: RIGHT/LEFT HEART CATH AND CORONARY ANGIOGRAPHY;  Surgeon: Tonny Bollman, MD;  Location: [facility] INVASIVE CV LAB;  Service: Cardiovascular;  Laterality: N/A;   SHOULDER ARTHROSCOPY W/ ROTATOR CUFF REPAIR Right 2008   TRANSCATHETER AORTIC VALVE REPLACEMENT, TRANSFEMORAL Left 04/14/2022   Procedure: Transcatheter Aortic Valve Replacement, Transfemoral;  Surgeon: Tonny Bollman, MD;  Location: [facility]  INVASIVE CV LAB;  Service: Open Heart Surgery;  Laterality: Left;   Family History  Problem Relation Age of Onset   Stomach cancer Mother        died in 75's    Hypertension Father        died in his 19's pna likely dementia   Dementia Father    Pneumonia Father    Heart disease Brother        S/P CABG   Sleep apnea Brother    Sleep apnea Daughter    Hypertension Other    Prostate cancer Neg Hx    Colon cancer Neg Hx    Social History   Socioeconomic History   Marital status: Widowed    Spouse name: Not on file   Number of children: 3   Years of education: Not on file   Highest education level: Not on file  Occupational History   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  Tobacco Use   Smoking status: Former    Current packs/day: 0.00    Average packs/day: 0.1 packs/day for 4.0 years (0.5 ttl pk-yrs)    Types: Cigarettes    Start date: 03/16/1965    Quit date: 03/16/1969    Years since quitting: 53.7    Passive exposure: Never   Smokeless tobacco: Former   Tobacco comments:    "chewed when I smoked; smoked 1 pack/week for about 4 years, quit 1971  Vaping Use   Vaping status: Never Used  Substance and Sexual Activity   Alcohol use: Not Currently    Alcohol/week: 0.0 standard drinks of alcohol    Comment: 09/09/2017  "Quit all alcohol in 1970's"   Drug use: Never   Sexual activity: Not on file  Other Topics Concern   Not on file  Social History Narrative   Lives in [place], married 1953, widowed after 62 years   Retired Naval architect, still does Aeronautical engineer.   He is not routinely exercising.   Daily caffeine    Social Determinants of Health   Financial Resource Strain: Low Risk  (11/30/2022)   Overall Financial Resource Strain (CARDIA)    Difficulty of Paying Living Expenses: Not hard at all  Food Insecurity: No Food Insecurity (11/30/2022)   Hunger Vital Sign    Worried About Running Out of Food in the Last Year: Never true    Ran Out  of Food in the Last Year: Never true  Transportation Needs:  No Transportation Needs (11/30/2022)   PRAPARE - Administrator, Civil Service (Medical): No    Lack of Transportation (Non-Medical): No  Physical Activity: Sufficiently Active (11/30/2022)   Exercise Vital Sign    Days of Exercise per Week: 7 days    Minutes of Exercise per Session: 30 min  Stress: No Stress Concern Present (11/30/2022)   [employer] of Occupational Health - Occupational Stress Questionnaire    Feeling of Stress : Not at all  Social Connections: Socially Isolated (11/30/2022)   Social Connection and Isolation Panel [NHANES]    Frequency of Communication with Friends and Family: More than three times a week    Frequency of Social Gatherings with Friends and Family: More than three times a week    Attends Religious Services: Never    Database administrator or Organizations: No    Attends Banker Meetings: Never    Marital Status: Widowed    Tobacco Counseling Counseling given: Not Answered Tobacco comments: "chewed when I smoked; smoked 1 pack/week for about 4 years, quit 1971   Clinical Intake:  Pre-visit preparation completed: Yes  Pain : No/denies pain     BMI - recorded: 34.22 Nutritional Status: BMI > 30  Obese Nutritional Risks: None Diabetes: No  How often do you need to have someone help you when you read instructions, pamphlets, or other written materials from your doctor or pharmacy?: 1 - Never  Interpreter Needed?: No  Information entered by :: C.Khiry Pasquariello LPN   Activities of Daily Living    11/30/2022   11:27 AM 04/14/2022    9:53 AM  In your present state of health, do you have any difficulty performing the following activities:  Hearing? 0   Vision? 0   Difficulty concentrating or making decisions? 0   Walking or climbing stairs? 0   Dressing or bathing? 0   Doing errands, shopping? 0 0  Preparing Food and eating ? N   Using the Toilet? N   In  the past six months, have you accidently leaked urine? N   Do you have problems with loss of bowel control? N   Managing your Medications? N   Managing your Finances? N   Housekeeping or managing your Housekeeping? N     Patient Care Team: Joaquim Nam, MD as PCP - General (Family Medicine) Tonny Bollman, MD as PCP - Cardiology (Cardiology) Tonny Bollman, MD (Cardiology) Salvatore Marvel, MD as Consulting Physician (Orthopedic Surgery) Anthony Sar, MD as Consulting Physician (Nephrology) Alden Hipp, RPH-CPP (Pharmacist)  Indicate any recent Medical Services you may have received from other than [facility] providers in the past year (date may be approximate).     Assessment:   This is a routine wellness examination for Nooh.  Hearing/Vision screen Hearing Screening - Comments:: Denies hearing difficulties   Vision Screening - Comments:: Readers - [facility] - UTD on exams   Goals Addressed             This Visit's Progress    Patient Stated       Maintain Current health status       Depression Screen    11/30/2022   11:22 AM 09/10/2022    9:56 AM 11/27/2021   11:06 AM 11/23/2020   11:05 AM 05/16/2019    2:45 PM 03/30/2019   10:55 AM 02/13/2016    2:08 PM  PHQ 2/9 Scores  PHQ - 2 Score 0 0 0 0 0 0 0  PHQ- 9 Score   0  0      Fall Risk    11/30/2022   11:25 AM 09/10/2022    9:45 AM 11/27/2021   11:08 AM 11/26/2021   11:46 AM 06/20/2020   12:57 PM  Fall Risk   Falls in the past year? 0 0 0 0 0  Number falls in past yr: 0 0 0    Injury with Fall? 0 0 0    Risk for fall due to : Impaired balance/gait Impaired mobility No Fall Risks    Follow up Falls prevention discussed;Falls evaluation completed;Education provided Falls evaluation completed Falls prevention discussed;Falls evaluation completed      MEDICARE RISK AT HOME: Medicare Risk at Home Any stairs in or around the home?: No If so, are there any without handrails?: No Home free of loose throw  rugs in walkways, pet beds, electrical cords, etc?: Yes Adequate lighting in your home to reduce risk of falls?: Yes Life alert?: No Use of a cane, walker or w/c?: Yes (cane) Grab bars in the bathroom?: Yes Shower chair or bench in shower?: Yes Elevated toilet seat or a handicapped toilet?: Yes  TIMED UP AND GO:  Was the test performed?  No    Cognitive Function:    05/16/2019    2:47 PM 02/13/2016    2:10 PM  MMSE - Mini Mental State Exam  Orientation to time 5 5  Orientation to Place 5 5  Registration 3 3  Attention/ Calculation 5 0  Recall 2 3  Language- name 2 objects  0  Language- repeat 1 1  Language- follow 3 step command  3  Language- read & follow direction  0  Write a sentence  0  Copy design  0  Total score  20        11/30/2022   11:29 AM 11/27/2021   11:10 AM  6CIT Screen  What Year? 0 points 0 points  What month? 3 points 0 points  What time? 0 points 0 points  Count back from 20 2 points 0 points  Months in reverse 0 points 0 points  Repeat phrase 4 points 2 points  Total Score 9 points 2 points    Immunizations Immunization History  Administered Date(s) Administered   Influenza, High Dose Seasonal PF 12/15/2016, 12/19/2018   Influenza, Seasonal, Injecte, Preservative Fre 12/14/2013   Influenza,inj,Quad PF,6+ Mos 12/17/2014, 01/29/2018   Influenza-Unspecified 12/15/2016, 12/19/2018, 01/11/2021   Moderna Sars-Covid-2 Vaccination 04/11/2019, 05/09/2019, 01/15/2020, 08/09/2020   Pneumococcal Conjugate-13 12/17/2014   Pneumococcal Polysaccharide-23 09/19/2009   Td 09/19/2009   Zoster, Live 09/19/2009    TDAP status: Up to date  Flu Vaccine status: Due, Education has been provided regarding the importance of this vaccine. Advised may receive this vaccine at local pharmacy or Health Dept. Aware to provide a copy of the vaccination record if obtained from local pharmacy or Health Dept. Verbalized acceptance and understanding.  Pneumococcal vaccine  status: Up to date  Covid-19 vaccine status: Information provided on how to obtain vaccines.   Qualifies for Shingles Vaccine? Yes   Zostavax completed Yes   Shingrix Completed?: No.    Education has been provided regarding the importance of this vaccine. Patient has been advised to call insurance company to determine out of pocket expense if they have not yet received this vaccine. Advised may also receive vaccine at local pharmacy or Health Dept. Verbalized acceptance and understanding.  Screening Tests Health Maintenance  Topic Date Due  DTaP/Tdap/Td (2 - Tdap) 09/20/2019   OPHTHALMOLOGY EXAM  11/04/2022   INFLUENZA VACCINE  06/14/2023 (Originally 10/15/2022)   Zoster Vaccines- Shingrix (1 of 2) 09/13/2023 (Originally 12/30/1949)   HEMOGLOBIN A1C  03/12/2023   FOOT EXAM  09/10/2023   Medicare Annual Wellness (AWV)  11/30/2023   Pneumonia Vaccine 50+ Years old  Completed   HPV VACCINES  Aged Out   COVID-19 Vaccine  Discontinued    Health Maintenance  Health Maintenance Due  Topic Date Due   DTaP/Tdap/Td (2 - Tdap) 09/20/2019   OPHTHALMOLOGY EXAM  11/04/2022    Colorectal cancer screening: No longer required.   Lung Cancer Screening: (Low Dose CT Chest recommended if Age 21-80 years, 20 pack-year currently smoking OR have quit w/in 15years.) does not qualify.   Lung Cancer Screening Referral:    Additional Screening:  Hepatitis C Screening: does not qualify; Completed    Vision Screening: Recommended annual ophthalmology exams for early detection of glaucoma and other disorders of the eye. Is the patient up to date with their annual eye exam?  Yes  Who is the provider or what is the name of the office in which the patient attends annual eye exams? [name] If pt is not established with a provider, would they like to be referred to a provider to establish care? Yes .   Dental Screening: Recommended annual dental exams for proper oral hygiene  Diabetic Foot Exam:    Community Resource Referral / Chronic Care Management: CRR required this visit?  No   CCM required this visit?  No     Plan:     I have personally reviewed and noted the following in the patient's chart:   Medical and social history Use of alcohol, tobacco or illicit drugs  Current medications and supplements including opioid prescriptions. Patient is not currently taking opioid prescriptions. Functional ability and status Nutritional status Physical activity Advanced directives List of other physicians Hospitalizations, surgeries, and ER visits in previous 12 months Vitals Screenings to include cognitive, depression, and falls Referrals and appointments  In addition, I have reviewed and discussed with patient certain preventive protocols, quality metrics, and best practice recommendations. A written personalized care plan for preventive services as well as general preventive health recommendations were provided to patient.     Maryan Puls, LPN   1/61/0960   After Visit Summary: (MyChart) Due to this being a telephonic visit, the after visit summary with patients personalized plan was offered to patient via MyChart   Nurse Notes: None

## 2022-12-03 ENCOUNTER — Other Ambulatory Visit: Payer: Self-pay | Admitting: Family Medicine

## 2022-12-08 ENCOUNTER — Ambulatory Visit: Payer: Medicare Other | Attending: Cardiovascular Disease

## 2022-12-08 DIAGNOSIS — I482 Chronic atrial fibrillation, unspecified: Secondary | ICD-10-CM | POA: Diagnosis not present

## 2022-12-08 DIAGNOSIS — Z952 Presence of prosthetic heart valve: Secondary | ICD-10-CM

## 2022-12-08 LAB — POCT INR: INR: 2.4 (ref 2.0–3.0)

## 2022-12-08 NOTE — Patient Instructions (Signed)
continue taking warfarin 1 tablet daily except for 1.5 tablets on Sundays and Thursdays. INR in 6 weeks. Call if any new medications or chnges [phone]. Allopurinol may be increased to 150 mg Daily, pt confirming with Dr Para March

## 2022-12-18 ENCOUNTER — Other Ambulatory Visit: Payer: Self-pay | Admitting: Cardiovascular Disease

## 2022-12-18 ENCOUNTER — Other Ambulatory Visit: Payer: Self-pay | Admitting: Family Medicine

## 2022-12-18 DIAGNOSIS — M109 Gout, unspecified: Secondary | ICD-10-CM

## 2022-12-29 ENCOUNTER — Ambulatory Visit (INDEPENDENT_AMBULATORY_CARE_PROVIDER_SITE_OTHER): Payer: Medicare Other | Admitting: Adult Health

## 2022-12-29 ENCOUNTER — Encounter: Payer: Self-pay | Admitting: Adult Health

## 2022-12-29 VITALS — BP 133/59 | HR 60 | Ht 65.0 in | Wt 211.6 lb

## 2022-12-29 DIAGNOSIS — G4733 Obstructive sleep apnea (adult) (pediatric): Secondary | ICD-10-CM

## 2022-12-29 NOTE — Patient Instructions (Signed)
Continue using CPAP nightly and greater than 4 hours each night °If your symptoms worsen or you develop new symptoms please let us know.  ° °

## 2022-12-29 NOTE — Progress Notes (Signed)
PATIENT: [name] DOB: 02-04-1931  REASON FOR VISIT: follow up HISTORY FROM: patient PRIMARY NEUROLOGIST: Dr. Vickey Huger  Chief Complaint  Patient presents with   Follow-up    Pt in 19 with daughter Pt here for CPAP f/u Pt states no questions or concerns for today's visit     HISTORY OF PRESENT ILLNESS: Today 12/29/22:  [name] is a 89 y.o. male with a history of OSA on Bipap. Returns today for follow-up.  He reports that the CPAP is working well for him.  He denies any new issues.  His download is below     12/18/21: [name] is an 89 year old male with a history of obstructive sleep apnea on BiPAP.  He returns today for follow-up.  His download is below.  He denies any new issues.  Reports that the BiPAP works well for him.  He does not like to sleep without it.  Returns today for an evaluation.      REVIEW OF SYSTEMS: Out of a complete 14 system review of symptoms, the patient complains only of the following symptoms, and all other reviewed systems are negative.    ESS 3  ALLERGIES: Allergies  Allergen Reactions   Penicillins Shortness Of Breath and Swelling    Patient tolerated Rocephin injection 2019  PATIENT HAS HAD A PCN REACTION WITH IMMEDIATE RASH, FACIAL/TONGUE/THROAT SWELLING, SOB, OR LIGHTHEADEDNESS WITH HYPOTENSION:  #  #  YES  #  Has patient had a PCN reaction causing severe rash involving mucus membranes or skin necrosis: NO Has patient had a PCN reaction that required hospitalization NO Has patient had a PCN reaction occurring within the last 10 years: NO If all of the above answers are "NO", then may proceed with Cephalosporin use.    Atorvastatin Other (See Comments)    No energy, just felt bad     Pravastatin Other (See Comments)    myalgias   Atrovent Nasal Spray [Ipratropium] Other (See Comments)    Dizzy with use   Hydrocodone Itching and Other (See Comments)    "swimmy headed" after use   Nsaids Other (See Comments)    Due  to renal function   Uloric [Febuxostat] Other (See Comments)    Would avoid, worsening gout sx with use.    Vancomycin Other (See Comments)    Red man syndrome- tolerated with slow run and benadryl use   Clarithromycin Diarrhea   Doxycycline Other (See Comments)    GI upset     HOME MEDICATIONS: Outpatient Medications Prior to Visit  Medication Sig Dispense Refill   acetaminophen (TYLENOL) 500 MG tablet Take 500 mg by mouth as needed for mild pain.     allopurinol (ZYLOPRIM) 100 MG tablet TAKE 1 TABLET BY MOUTH EVERY DAY 90 tablet 3   aspirin EC 81 MG tablet Take 1 tablet (81 mg total) by mouth daily. Swallow whole. 90 tablet 3   azithromycin (ZITHROMAX Z-PAK) 250 MG tablet Take 2 tablets (500 mg) PO today, then 1 tablet (250 mg) PO daily x4 days. 6 tablet 0   azithromycin (ZITHROMAX) 500 MG tablet TAKE 500 MG BY MOUTH  1 HOUR PRIOR TO ANY DENTAL CLEANINGS AND PROCEDURES. 1 tablet 12   colchicine 0.6 MG tablet TAKE 1 TAB TWICE A DAY FOR 1 DAY IF NEEDED. THEN 1 A DAY IF NEEDED THEREAFTER. FOR GOUT. 90 tablet 1   cyanocobalamin (,VITAMIN B-12,) 1000 MCG/ML injection IM every 28 days 1 mL    diltiazem (  CARDIZEM CD) 180 MG 24 hr capsule TAKE 1 CAPSULE BY MOUTH EVERY DAY 90 capsule 3   furosemide (LASIX) 20 MG tablet TAKE 1 TABLET BY MOUTH EVERY DAY 90 tablet 3   lisinopril (ZESTRIL) 10 MG tablet Take 1 tablet (10 mg total) by mouth 2 (two) times daily. 180 tablet 3   metoprolol tartrate (LOPRESSOR) 25 MG tablet TAKE 0.5 TABLETS BY MOUTH 2 TIMES DAILY. 90 tablet 3   pantoprazole (PROTONIX) 40 MG tablet Take 1 tablet (40 mg total) by mouth daily. 90 tablet 2   predniSONE (DELTASONE) 10 MG tablet Take 1 tablet (10 mg total) by mouth daily with breakfast. 10 tablet 0   tamsulosin (FLOMAX) 0.4 MG CAPS capsule TAKE 1 CAPSULE BY MOUTH EVERY DAY 90 capsule 3   warfarin (COUMADIN) 4 MG tablet TAKE 1 TO 1 AND 1/2 TABLETS BY MOUTH DAILY OR AS DIRECTED BY COUMADIN CLINIC 100 tablet 1   No  facility-administered medications prior to visit.    PAST MEDICAL HISTORY: Past Medical History:  Diagnosis Date   Arthritis    Bradycardia    a. nocturnal with pauses overnight on tele likely due to OSA 10/2015.   CKD (chronic kidney disease), stage III (HCC)    Coronary artery disease    a. prev nonobst. b. LHC 10/24/15: occlusion of small diffusely diseased OM2 s/p balloon angioplasty, mild nonobstructive disease of mLAD and mRCA, normal LVEDP   Diabetes mellitus without complication (HCC)    01/02/21 Pt's daughter states patient does not  have diabetes.  A1C 07/23/20 was 7.2, patientr had been on medication.   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 Excell Seltzer & Dr. Leafy Ro   Severe aortic stenosis    Vertigo     PAST SURGICAL HISTORY: Past Surgical History:  Procedure Laterality Date   AMPUTATION Right 03/03/2019   Procedure: RIGHT 2ND TOE AMPUTATION;  Surgeon: Nadara Mustard, MD;  Location: [facility] OR;  Service: Orthopedics;  Laterality: Right;   AMPUTATION Right 01/03/2021   Procedure: RIGHT 3RD TOE AMPUTATION;  Surgeon: Nadara Mustard, MD;  Location: [facility] OR;  Service: Orthopedics;  Laterality: Right;   BALLOON DILATION N/A 10/22/2014   Procedure: BALLOON DILATION;  Surgeon: Iva Boop, MD;  Location: [facility] ENDOSCOPY;  Service: Endoscopy;  Laterality: N/A;   CARDIAC CATHETERIZATION  07/2008    CARDIAC CATHETERIZATION N/A 10/24/2015   Procedure: Left Heart Cath and Coronary Angiography;  Surgeon: Yvonne Kendall, MD;  Location: [facility] INVASIVE CV LAB;  Service: Cardiovascular;  Laterality: N/A;   CARDIAC CATHETERIZATION N/A 10/24/2015   Procedure: Coronary Balloon Angioplasty;  Surgeon: Yvonne Kendall, MD;  Location: [facility] INVASIVE CV LAB;  Service: Cardiovascular;  Laterality: N/A;   Carotid Dopplers  09/2009   no sig extracranial stenosis and vertebral arteries  had antegrade flow   CATARACT EXTRACTION W/ INTRAOCULAR LENS  IMPLANT, BILATERAL Bilateral    CORONARY STENT INTERVENTION N/A 03/11/2022   Procedure: CORONARY STENT INTERVENTION;  Surgeon: Tonny Bollman, MD;  Location: [facility] INVASIVE CV LAB;  Service: Cardiovascular;  Laterality: N/A;   ESOPHAGOGASTRODUODENOSCOPY (EGD) WITH ESOPHAGEAL DILATION     "I've had it stretched 3 times" (10/23/2015)   ESOPHAGOGASTRODUODENOSCOPY (EGD) WITH ESOPHAGEAL DILATION  06/2011   /medical hx above  (09/09/2017)   ESOPHAGOGASTRODUODENOSCOPY (EGD) WITH PROPOFOL N/A 10/22/2014   Procedure: ESOPHAGOGASTRODUODENOSCOPY (EGD) WITH PROPOFOL;  Surgeon: Iva Boop, MD;  Location: [facility] ENDOSCOPY;  Service: Endoscopy;  Laterality: N/A;   FINGER SURGERY Right 02/1964   "cut little finger off; had it reattached"   I & D EXTREMITY Right 09/10/2017   Procedure: RIGHT FOOT DEBRIDEMENT;  Surgeon: Nadara Mustard, MD;  Location: [facility] OR;  Service: Orthopedics;  Laterality: Right;   INTRAOPERATIVE TRANSTHORACIC ECHOCARDIOGRAM N/A 04/14/2022   Procedure: INTRAOPERATIVE TRANSTHORACIC ECHOCARDIOGRAM;  Surgeon: Tonny Bollman, MD;  Location: [facility] INVASIVE CV LAB;  Service: Open Heart Surgery;  Laterality: N/A;   MRI of brain  09/2009   chronic microvascular ischemia   OSA Sleep Study     per Dr. Vickey Huger   RIGHT/LEFT HEART CATH AND CORONARY ANGIOGRAPHY N/A 03/11/2022   Procedure: RIGHT/LEFT HEART CATH AND CORONARY ANGIOGRAPHY;  Surgeon: Tonny Bollman, MD;  Location: [facility] INVASIVE CV  LAB;  Service: Cardiovascular;  Laterality: N/A;   SHOULDER ARTHROSCOPY W/ ROTATOR CUFF REPAIR Right 2008   TRANSCATHETER AORTIC VALVE REPLACEMENT, TRANSFEMORAL Left 04/14/2022   Procedure: Transcatheter Aortic Valve Replacement, Transfemoral;  Surgeon: Tonny Bollman, MD;  Location: [facility] INVASIVE CV LAB;  Service: Open Heart Surgery;  Laterality: Left;    FAMILY HISTORY: Family History  Problem Relation Age of Onset   Stomach cancer Mother        died in 13's    Hypertension Father        died in his 22's pna likely dementia   Dementia Father    Pneumonia Father    Heart disease Brother        S/P CABG   Sleep apnea Brother    Sleep apnea Daughter    Hypertension Other    Prostate cancer Neg Hx    Colon cancer Neg Hx     SOCIAL HISTORY: Social History   Socioeconomic History   Marital status: Widowed    Spouse name: Not on file   Number of children: 3   Years of education: Not on file   Highest education level: Not on file  Occupational History   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  Tobacco Use   Smoking status: Former    Current packs/day: 0.00    Average packs/day: 0.1 packs/day for 4.0 years (0.5 ttl pk-yrs)    Types: Cigarettes    Start date: 03/16/1965    Quit date: 03/16/1969    Years since quitting: 53.8    Passive exposure: Never   Smokeless tobacco: Former   Tobacco comments:    "chewed when I smoked; smoked 1 pack/week for about 4 years, quit 1971  Vaping Use   Vaping status: Never Used  Substance and Sexual Activity   Alcohol use: Not Currently    Alcohol/week: 0.0 standard drinks of alcohol    Comment: 09/09/2017  "Quit all alcohol in 1970's"   Drug use: Never   Sexual activity: Not on file  Other Topics Concern   Not on  file  Social History Narrative   Lives in [place], married 1953, widowed after 62 years   Retired Naval architect, still does Aeronautical engineer.   He is not routinely exercising.   Daily caffeine    Social  Determinants of Health   Financial Resource Strain: Low Risk  (11/30/2022)   Overall Financial Resource Strain (CARDIA)    Difficulty of Paying Living Expenses: Not hard at all  Food Insecurity: No Food Insecurity (11/30/2022)   Hunger Vital Sign    Worried About Running Out of Food in the Last Year: Never true    Ran Out of Food in the Last Year: Never true  Transportation Needs: No Transportation Needs (11/30/2022)   PRAPARE - Administrator, Civil Service (Medical): No    Lack of Transportation (Non-Medical): No  Physical Activity: Sufficiently Active (11/30/2022)   Exercise Vital Sign    Days of Exercise per Week: 7 days    Minutes of Exercise per Session: 30 min  Stress: No Stress Concern Present (11/30/2022)   [employer] of Occupational Health - Occupational Stress Questionnaire    Feeling of Stress : Not at all  Social Connections: Socially Isolated (11/30/2022)   Social Connection and Isolation Panel [NHANES]    Frequency of Communication with Friends and Family: More than three times a week    Frequency of Social Gatherings with Friends and Family: More than three times a week    Attends Religious Services: Never    Database administrator or Organizations: No    Attends Banker Meetings: Never    Marital Status: Widowed  Intimate Partner Violence: Not At Risk (11/30/2022)   Humiliation, Afraid, Rape, and Kick questionnaire    Fear of Current or Ex-Partner: No    Emotionally Abused: No    Physically Abused: No    Sexually Abused: No      PHYSICAL EXAM  Vitals:   12/29/22 1004  BP: (!) 133/59  Pulse: 60  Weight: 211 lb 9.6 oz (96 kg)  Height: 5\' 5"  (1.651 m)   Body mass index is 35.21 kg/m.  Generalized: Well developed, in no acute distress  Chest: Lungs clear to auscultation bilaterally  Neurological examination  Mentation: Alert oriented to time, place, history taking. Follows all commands speech and language fluent Gait and  station: Gait is normal.    DIAGNOSTIC DATA (LABS, IMAGING, TESTING) - I reviewed patient records, labs, notes, testing and imaging myself where available.  Lab Results  Component Value Date   WBC 8.5 06/17/2022   HGB 13.8 06/17/2022   HCT 41.6 06/17/2022   MCV 86.5 06/17/2022   PLT 173 06/17/2022      Component Value Date/Time   NA 138 07/21/2022 1120   K 4.6 07/21/2022 1120   CL 99 07/21/2022 1120   CO2 26 07/21/2022 1120   GLUCOSE 140 (H) 07/21/2022 1120   GLUCOSE 146 (H) 06/17/2022 2229   BUN 23 07/21/2022 1120   CREATININE 1.73 (H) 07/21/2022 1120   CREATININE 1.15 05/16/2021 1544   CALCIUM 9.2 07/21/2022 1120   PROT 7.9 04/10/2022 0930   PROT 7.1 09/07/2019 1020   ALBUMIN 4.1 04/10/2022 0930   AST 24 04/10/2022 0930   ALT 22 04/10/2022 0930   ALKPHOS 91 04/10/2022 0930   BILITOT 0.8 04/10/2022 0930   GFRNONAA 37 (L) 06/17/2022 2229   GFRAA 55 (L) 03/03/2019 0643   Lab Results  Component Value Date   CHOL 189 09/10/2022   HDL 27.50 (  L) 09/10/2022   LDLCALC 122 (H) 09/10/2022   LDLDIRECT 95.0 05/16/2019   TRIG 193.0 (H) 09/10/2022   CHOLHDL 7 09/10/2022   Lab Results  Component Value Date   HGBA1C 6.6 (H) 09/10/2022   Lab Results  Component Value Date   VITAMINB12 458 09/10/2022   Lab Results  Component Value Date   TSH 2.33 05/16/2019      ASSESSMENT AND PLAN 89 y.o. year old male  has a past medical history of Arthritis, Bradycardia, CKD (chronic kidney disease), stage III (HCC), Coronary artery disease, Diabetes mellitus without complication (HCC), Diabetic peripheral neuropathy (HCC) (10/04/2019), Diverticulosis, Esophageal reflux, Esophageal stricture from GERD (06/08/2011), Gastric ulcer, Gout, unspecified, History of blood transfusion (02/1964), Hyperlipidemia, Hypertension, Obesity, unspecified, OSA on CPAP, Paroxysmal nocturnal dyspnea, Persistent atrial fibrillation (HCC), Pneumonia, S/P TAVR (transcatheter aortic valve replacement)  (04/14/2022), Severe aortic stenosis, and Vertigo. here with:  OSA on BiPAP  - BIPAP compliance excellent - Good treatment of AHI  - Encourage patient to use BIPAP nightly and > 4 hours each night - F/U in 1 year or sooner if needed   Butch Penny, MSN, NP-C 12/29/2022, 10:18 AM [facility] [address] [place], [place] [address] [phone]

## 2022-12-31 ENCOUNTER — Telehealth: Payer: Self-pay | Admitting: Family Medicine

## 2022-12-31 NOTE — Telephone Encounter (Signed)
Patient daughter Stephannie Peters called in regarding a bill she received. She stated that she had spoke with you in regards to this matter but didn't see anything noted. She also stated that she contact patient accounting and was informed to call back here. She can be reached at [phone].

## 2023-01-05 ENCOUNTER — Ambulatory Visit (INDEPENDENT_AMBULATORY_CARE_PROVIDER_SITE_OTHER): Payer: Medicare Other

## 2023-01-05 DIAGNOSIS — E538 Deficiency of other specified B group vitamins: Secondary | ICD-10-CM

## 2023-01-05 MED ORDER — CYANOCOBALAMIN 1000 MCG/ML IJ SOLN
1000.0000 ug | Freq: Once | INTRAMUSCULAR | Status: AC
Start: 2023-01-05 — End: 2023-01-05
  Administered 2023-01-05: 1000 ug via INTRAMUSCULAR

## 2023-01-05 NOTE — Progress Notes (Signed)
Patient presented for B 12 injection given by Makinzy Cleere, CMA to right deltoid, patient voiced no concerns nor showed any signs of distress during injection.  

## 2023-01-08 ENCOUNTER — Telehealth: Payer: Self-pay | Admitting: Cardiovascular Disease

## 2023-01-08 NOTE — Telephone Encounter (Signed)
I spoke to patient's daughter who mentioned that the patient held Coumadin for 2 days prior to dental work per Dr Excell Seltzer, although I see no note of Dr Earmon Phoenix advisement.  I told her to have patient abstain from greens through the weekend and to keep 11/5 INR check.  She verbalized understanding

## 2023-01-08 NOTE — Telephone Encounter (Signed)
Daughter calling to see if he needs to come in before 01/19/23 coumadin appt because he was taken off for a dental procedure. Please call daughter

## 2023-01-08 NOTE — Telephone Encounter (Signed)
Lp's daughter message tcb to discuss pt's INR check.

## 2023-01-14 DIAGNOSIS — N183 Chronic kidney disease, stage 3 unspecified: Secondary | ICD-10-CM | POA: Diagnosis not present

## 2023-01-14 DIAGNOSIS — Z952 Presence of prosthetic heart valve: Secondary | ICD-10-CM | POA: Diagnosis not present

## 2023-01-14 DIAGNOSIS — R809 Proteinuria, unspecified: Secondary | ICD-10-CM | POA: Diagnosis not present

## 2023-01-14 DIAGNOSIS — N2581 Secondary hyperparathyroidism of renal origin: Secondary | ICD-10-CM | POA: Diagnosis not present

## 2023-01-14 DIAGNOSIS — I129 Hypertensive chronic kidney disease with stage 1 through stage 4 chronic kidney disease, or unspecified chronic kidney disease: Secondary | ICD-10-CM | POA: Diagnosis not present

## 2023-01-14 DIAGNOSIS — D631 Anemia in chronic kidney disease: Secondary | ICD-10-CM | POA: Diagnosis not present

## 2023-01-14 DIAGNOSIS — M109 Gout, unspecified: Secondary | ICD-10-CM | POA: Diagnosis not present

## 2023-01-17 ENCOUNTER — Other Ambulatory Visit: Payer: Self-pay | Admitting: Cardiology

## 2023-01-18 DIAGNOSIS — T560X1A Toxic effect of lead and its compounds, accidental (unintentional), initial encounter: Secondary | ICD-10-CM | POA: Diagnosis not present

## 2023-01-19 ENCOUNTER — Ambulatory Visit: Payer: Medicare Other | Attending: Cardiovascular Disease | Admitting: *Deleted

## 2023-01-19 DIAGNOSIS — I482 Chronic atrial fibrillation, unspecified: Secondary | ICD-10-CM | POA: Diagnosis not present

## 2023-01-19 DIAGNOSIS — Z952 Presence of prosthetic heart valve: Secondary | ICD-10-CM | POA: Diagnosis not present

## 2023-01-19 LAB — POCT INR: INR: 1.9 — AB (ref 2.0–3.0)

## 2023-01-19 NOTE — Patient Instructions (Signed)
Description   CALL IF THE PREDNISONE IS INCREASED AND IF YOU GET ANOTHER ANTIBIOTIC. Today take 1.5 tablets of warfarin then continue taking warfarin 1 tablet daily except for 1.5 tablets on Sundays and Thursdays. INR in 6 weeks. Call if any new medications or changes [phone]. Allopurinol may be increased to 150 mg Daily, pt confirming with Dr Para March

## 2023-01-20 LAB — LAB REPORT - SCANNED
Albumin, Urine POC: 5.1
Creatinine, POC: 29.6 mg/dL
EGFR: 40
Microalb Creat Ratio: 17

## 2023-01-27 DIAGNOSIS — T560X1A Toxic effect of lead and its compounds, accidental (unintentional), initial encounter: Secondary | ICD-10-CM | POA: Diagnosis not present

## 2023-02-03 ENCOUNTER — Encounter (HOSPITAL_COMMUNITY): Payer: Self-pay

## 2023-02-03 ENCOUNTER — Emergency Department (HOSPITAL_COMMUNITY): Payer: Medicare Other

## 2023-02-03 ENCOUNTER — Other Ambulatory Visit: Payer: Self-pay

## 2023-02-03 ENCOUNTER — Emergency Department (HOSPITAL_COMMUNITY)
Admission: EM | Admit: 2023-02-03 | Discharge: 2023-02-03 | Disposition: A | Discharge status: Home / Self Care | Payer: Medicare Other | Attending: Emergency Medicine | Admitting: Emergency Medicine

## 2023-02-03 DIAGNOSIS — R509 Fever, unspecified: Secondary | ICD-10-CM | POA: Insufficient documentation

## 2023-02-03 DIAGNOSIS — Z20822 Contact with and (suspected) exposure to covid-19: Secondary | ICD-10-CM | POA: Insufficient documentation

## 2023-02-03 DIAGNOSIS — I129 Hypertensive chronic kidney disease with stage 1 through stage 4 chronic kidney disease, or unspecified chronic kidney disease: Secondary | ICD-10-CM | POA: Insufficient documentation

## 2023-02-03 DIAGNOSIS — I2510 Atherosclerotic heart disease of native coronary artery without angina pectoris: Secondary | ICD-10-CM | POA: Diagnosis not present

## 2023-02-03 DIAGNOSIS — E1140 Type 2 diabetes mellitus with diabetic neuropathy, unspecified: Secondary | ICD-10-CM | POA: Diagnosis not present

## 2023-02-03 DIAGNOSIS — R0602 Shortness of breath: Secondary | ICD-10-CM | POA: Diagnosis not present

## 2023-02-03 DIAGNOSIS — N183 Chronic kidney disease, stage 3 unspecified: Secondary | ICD-10-CM | POA: Insufficient documentation

## 2023-02-03 DIAGNOSIS — R0981 Nasal congestion: Secondary | ICD-10-CM | POA: Insufficient documentation

## 2023-02-03 DIAGNOSIS — Z7982 Long term (current) use of aspirin: Secondary | ICD-10-CM | POA: Diagnosis not present

## 2023-02-03 DIAGNOSIS — R052 Subacute cough: Secondary | ICD-10-CM

## 2023-02-03 DIAGNOSIS — R051 Acute cough: Secondary | ICD-10-CM | POA: Diagnosis not present

## 2023-02-03 DIAGNOSIS — Z7901 Long term (current) use of anticoagulants: Secondary | ICD-10-CM | POA: Insufficient documentation

## 2023-02-03 DIAGNOSIS — R059 Cough, unspecified: Secondary | ICD-10-CM | POA: Diagnosis present

## 2023-02-03 DIAGNOSIS — Z79899 Other long term (current) drug therapy: Secondary | ICD-10-CM | POA: Diagnosis not present

## 2023-02-03 DIAGNOSIS — R0789 Other chest pain: Secondary | ICD-10-CM | POA: Diagnosis not present

## 2023-02-03 LAB — RESP PANEL BY RT-PCR (RSV, FLU A&B, COVID)  RVPGX2
Influenza A by PCR: NEGATIVE
Influenza B by PCR: NEGATIVE
Resp Syncytial Virus by PCR: NEGATIVE
SARS Coronavirus 2 by RT PCR: NEGATIVE

## 2023-02-03 LAB — BASIC METABOLIC PANEL
Anion gap: 9 (ref 5–15)
BUN: 15 mg/dL (ref 8–23)
CO2: 26 mmol/L (ref 22–32)
Calcium: 9.1 mg/dL (ref 8.9–10.3)
Chloride: 101 mmol/L (ref 98–111)
Creatinine, Ser: 1.55 mg/dL — ABNORMAL HIGH (ref 0.61–1.24)
GFR, Estimated: 42 mL/min — ABNORMAL LOW (ref >60)
Glucose, Bld: 102 mg/dL — ABNORMAL HIGH (ref 70–99)
Potassium: 4.6 mmol/L (ref 3.5–5.1)
Sodium: 136 mmol/L (ref 135–145)

## 2023-02-03 LAB — CBC
HCT: 51.7 % (ref 39.0–52.0)
Hemoglobin: 17 g/dL (ref 13.0–17.0)
MCH: 28.8 pg (ref 26.0–34.0)
MCHC: 32.9 g/dL (ref 30.0–36.0)
MCV: 87.5 fL (ref 80.0–100.0)
Platelets: 144 10*3/uL — ABNORMAL LOW (ref 150–400)
RBC: 5.91 MIL/uL — ABNORMAL HIGH (ref 4.22–5.81)
RDW: 14.8 % (ref 11.5–15.5)
WBC: 8.4 10*3/uL (ref 4.0–10.5)
nRBC: 0 % (ref 0.0–0.2)

## 2023-02-03 LAB — TROPONIN I (HIGH SENSITIVITY)
Troponin I (High Sensitivity): 6 ng/L (ref <18)
Troponin I (High Sensitivity): 6 ng/L (ref <18)

## 2023-02-03 LAB — PROTIME-INR
INR: 1.6 — ABNORMAL HIGH (ref 0.8–1.2)
Prothrombin Time: 19.6 s — ABNORMAL HIGH (ref 11.4–15.2)

## 2023-02-03 MED ORDER — ALBUTEROL SULFATE (2.5 MG/3ML) 0.083% IN NEBU
2.5000 mg | INHALATION_SOLUTION | Freq: Once | RESPIRATORY_TRACT | Status: AC
  Administered 2023-02-03: 2.5 mg via RESPIRATORY_TRACT
  Filled 2023-02-03: qty 3

## 2023-02-03 MED ORDER — PREDNISONE 50 MG PO TABS: 50.0000 mg | ORAL_TABLET | Freq: Every day | ORAL | 0 refills | Status: DC

## 2023-02-03 MED ORDER — AZITHROMYCIN 250 MG PO TABS: 250.0000 mg | ORAL_TABLET | Freq: Every day | ORAL | 0 refills | Status: DC

## 2023-02-03 MED ORDER — METHYLPREDNISOLONE SODIUM SUCC 125 MG IJ SOLR
125.0000 mg | Freq: Once | INTRAMUSCULAR | Status: AC
  Administered 2023-02-03: 125 mg via INTRAVENOUS
  Filled 2023-02-03: qty 2

## 2023-02-03 MED ORDER — ALBUTEROL SULFATE HFA 108 (90 BASE) MCG/ACT IN AERS: 1.0000 | INHALATION_SPRAY | Freq: Four times a day (QID) | RESPIRATORY_TRACT | 0 refills | Status: AC | PRN

## 2023-02-03 MED ORDER — ALBUTEROL SULFATE HFA 108 (90 BASE) MCG/ACT IN AERS: 2.0000 | INHALATION_SPRAY | RESPIRATORY_TRACT | Status: DC | PRN

## 2023-02-03 NOTE — ED Notes (Signed)
96% RA walking

## 2023-02-03 NOTE — Discharge Instructions (Addendum)
A pleasure taking care of you this evening.  You were evaluated in the emergency department for cough, congestion and chest tightness.  You were given multiple breathing treatments and steroids.  Chest x-ray did not demonstrate obvious pneumonia, however you are being given a short antibiotic course of antibiotics to treat empirically.  You are additionally being given a short course of steroids.  You experience any new or worsening symptoms included fevers, shortness of breath or chest pain please return to the emergency room.

## 2023-02-03 NOTE — ED Provider Notes (Signed)
[place] EMERGENCY DEPARTMENT AT [facility] Provider Note   CSN: [id number] Arrival date & time: 02/03/23  1313     History  Chief Complaint  Patient presents with   Chest Pain   Shortness of Breath    [name] is a 89 y.o. male past medical history of CAD, A-fib on warfarin, TAVR, CKD, OSA presents with complaints of clear productive cough, congestion, chest tightness, and fever x 6 days.  Patient is accompanied by his daughter reports that he has had a 101 and 102 fever at the past 2 days.  Patient denies any chest pain.  States there is   Chest Pain Associated symptoms: shortness of breath   Shortness of Breath Associated symptoms: no chest pain    Past Medical History:  Diagnosis Date   Arthritis    Bradycardia    a. nocturnal with pauses overnight on tele likely due to OSA 10/2015.   CKD (chronic kidney disease), stage III (HCC)    Coronary artery disease    a. prev nonobst. b. LHC 10/24/15: occlusion of small diffusely diseased OM2 s/p balloon angioplasty, mild nonobstructive disease of mLAD and mRCA, normal LVEDP   Diabetes mellitus without complication (HCC)    01/02/21 Pt's daughter states patient does not  have diabetes.  A1C 07/23/20 was 7.2, patientr had been on medication.   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 Excell Seltzer & Dr. Leafy Ro   Severe aortic  stenosis    Vertigo        Home Medications Prior to Admission medications   Medication Sig Start Date End Date Taking? Authorizing Provider  albuterol (VENTOLIN HFA) 108 (90 Base) MCG/ACT inhaler Inhale 1-2 puffs into the lungs every 6 (six) hours as needed for wheezing or shortness of breath. 02/03/23  Yes Halford Decamp, PA-C  azithromycin (ZITHROMAX) 250 MG tablet Take 1 tablet (250 mg total) by mouth daily. Take first 2 tablets together, then 1 every day until finished. 02/03/23  Yes Halford Decamp, PA-C  predniSONE (DELTASONE) 50 MG tablet Take 1 tablet (50 mg total) by mouth daily with breakfast. 02/03/23  Yes Halford Decamp, PA-C  acetaminophen (TYLENOL) 500 MG tablet Take 500 mg by mouth as needed for mild pain.    [provider]  allopurinol (ZYLOPRIM) 100 MG tablet TAKE 1 TABLET BY MOUTH EVERY DAY 12/18/22   Joaquim Nam, MD  aspirin EC 81 MG tablet Take 1 tablet (81 mg total) by mouth daily. Swallow whole. 09/21/22   Tonny Bollman, MD  azithromycin (ZITHROMAX Z-PAK) 250 MG tablet Take 2 tablets (500 mg) PO today, then 1 tablet (250 mg) PO daily x4 days. 10/23/22   Joaquim Nam, MD  azithromycin (ZITHROMAX) 500 MG tablet TAKE 500 MG BY  MOUTH  1 HOUR PRIOR TO ANY DENTAL CLEANINGS AND PROCEDURES. 04/24/22   Georgie Chard D, NP  colchicine 0.6 MG tablet TAKE 1 TAB TWICE A DAY FOR 1 DAY IF NEEDED. THEN 1 A DAY IF NEEDED THEREAFTER. FOR GOUT. 09/25/22   Joaquim Nam, MD  cyanocobalamin (,VITAMIN B-12,) 1000 MCG/ML injection IM every 28 days 09/13/20   Joaquim Nam, MD  diltiazem [facility] CD) 180 MG 24 hr capsule TAKE 1 CAPSULE BY MOUTH EVERY DAY 10/30/22   Tonny Bollman, MD  furosemide (LASIX) 20 MG tablet TAKE 1 TABLET BY MOUTH EVERY DAY 12/18/22   Tonny Bollman, MD  lisinopril (ZESTRIL) 10 MG tablet Take 1 tablet (10 mg total) by mouth 2 (two) times daily. 09/21/22   Tonny Bollman, MD  metoprolol tartrate (LOPRESSOR) 25 MG tablet TAKE 0.5  TABLETS BY MOUTH 2 TIMES DAILY. 09/21/22   Tonny Bollman, MD  pantoprazole (PROTONIX) 40 MG tablet TAKE 1 TABLET BY MOUTH EVERY DAY 01/18/23   Filbert Schilder, NP  predniSONE (DELTASONE) 10 MG tablet Take 1 tablet (10 mg total) by mouth daily with breakfast. 10/23/22   Joaquim Nam, MD  tamsulosin (FLOMAX) 0.4 MG CAPS capsule TAKE 1 CAPSULE BY MOUTH EVERY DAY 09/21/22   Joaquim Nam, MD  warfarin (COUMADIN) 4 MG tablet TAKE 1 TO 1 AND 1/2 TABLETS BY MOUTH DAILY OR AS DIRECTED BY COUMADIN CLINIC 11/11/22   Tonny Bollman, MD      Allergies    Penicillins, Atorvastatin, Pravastatin, Atrovent nasal spray [ipratropium], Hydrocodone, Nsaids, Uloric [febuxostat], Vancomycin, Clarithromycin, and Doxycycline    Review of Systems   Review of Systems  Respiratory:  Positive for chest tightness and shortness of breath.   Cardiovascular:  Negative for chest pain.    Physical Exam Updated Vital Signs BP (!) 154/94 (BP Location: Right Arm)   Pulse 94   Temp 98.7 F (37.1 C)   Resp 18   Ht 5\' 6"  (1.676 m)   Wt 93 kg   SpO2 94%   BMI 33.09 kg/m  Physical Exam Vitals and nursing note reviewed.  Constitutional:      General: He is not in acute distress.    Appearance: He is well-developed. He is not toxic-appearing.  HENT:     Head: Normocephalic and atraumatic.  Eyes:     Conjunctiva/sclera: Conjunctivae normal.  Cardiovascular:     Rate and Rhythm: Normal rate and regular rhythm.     Heart sounds: No murmur heard. Pulmonary:     Comments: No respiratory distress.  Active clear productive cough during exam.  Lung sounds are coarse with intermittent wheeze noted left upper lobe. No obvious rales appreciated Abdominal:     Palpations: Abdomen is soft.     Tenderness: There is no abdominal tenderness.  Musculoskeletal:        General: No swelling.     Cervical back: Neck supple.     Right lower leg: No edema.     Left lower leg: No edema.  Skin:    General: Skin is warm and dry.      Capillary Refill: Capillary refill takes less than 2 seconds.  Neurological:     Mental Status: He is alert.  Psychiatric:        Mood and Affect: Mood normal.     ED Results / Procedures / Treatments   Labs (all labs ordered are listed, but only abnormal results are displayed) Labs Reviewed  BASIC METABOLIC PANEL - Abnormal;  Notable for the following components:      Result Value   Glucose, Bld 102 (*)    Creatinine, Ser 1.55 (*)    GFR, Estimated 42 (*)    All other components within normal limits  CBC - Abnormal; Notable for the following components:   RBC 5.91 (*)    Platelets 144 (*)    All other components within normal limits  PROTIME-INR - Abnormal; Notable for the following components:   Prothrombin Time 19.6 (*)    INR 1.6 (*)    All other components within normal limits  RESP PANEL BY RT-PCR (RSV, FLU A&B, COVID)  [id number]  TROPONIN I (HIGH SENSITIVITY)  TROPONIN I (HIGH SENSITIVITY)    EKG None  Radiology DG Chest 2 View  Result Date: 02/03/2023 CLINICAL DATA:  Shortness of breath. EXAM: CHEST - 2 VIEW COMPARISON:  Chest radiograph dated June 17, 2022. FINDINGS: The heart size and mediastinal contours are within normal limits. TAVR. Multiple calcified granulomas are again noted throughout the lungs. No focal consolidation, pleural effusion, or pneumothorax. No acute osseous abnormality. IMPRESSION: No acute cardiopulmonary findings. Electronically Signed   By: Hart Robinsons M.D.   On: 02/03/2023 16:48    Procedures Procedures    Medications Ordered in ED Medications  albuterol (VENTOLIN HFA) 108 (90 Base) MCG/ACT inhaler 2 puff (has no administration in time range)  albuterol (PROVENTIL) (2.5 MG/3ML) 0.083% nebulizer solution 2.5 mg (2.5 mg Nebulization Given 02/03/23 1749)  methylPREDNISolone sodium succinate (SOLU-MEDROL) 125 mg/2 mL injection 125 mg (125 mg Intravenous Given 02/03/23 1757)  albuterol (PROVENTIL) (2.5 MG/3ML) 0.083% nebulizer solution  2.5 mg (2.5 mg Nebulization Given 02/03/23 1838)    ED Course/ Medical Decision Making/ A&P                                 Medical Decision Making Amount and/or Complexity of Data Reviewed Radiology: ordered.  Risk Prescription drug management.   This patient presents to the ED with chief complaint(s) of URI symptoms with chest tightness with pertinent past medical history of CAD, TAVR, CKD, OSA .  The complaint involves an extensive differential diagnosis and also carries with it a high risk of complications and morbidity.    The differential diagnosis includes  ACS, COPD, pneumonia, viral URI The initial plan is to  Will start with basic labs, respiratory panel, chest x-ray Additional history obtained: Additional history obtained from family Records reviewed EMR  Initial Assessment:   No respiratory distress, not requiring oxygen at this time, coarse lung sounds.  Describes chest tightness, no exertional component.  He does have cardiac risk factors, however suspect respiratory etiology.  Currently afebrile, hemodynamically stable  Independent ECG/labs interpretation:  The following labs were independently interpreted:  A-fib rate controled, no ischemic changes Respiratory panel negative, CBC without leukocytosis BMP within normal limits,, no troponin elevation,  Independent visualization and interpretation of imaging: I independently visualized the following imaging with scope of interpretation limited to determining acute life threatening conditions related to emergency care: Chest x-ray, which revealed no acute cardiopulmonary disease  Treatment and Reassessment: Patient given albuterol neb and Solu-Medrol. (Allergy to ipratropium) On reassessment patient has wheezing all lung fields.  Given additional albuterol neb. On reassessment, patient is sitting comfortably, satting upper 90s on room air.  He is ready to go home  Pulse ox monitored during ambulation, maintaining  sats in upper 90s  Consultations obtained:   None  Disposition:   Discharge home on 5-day course of azithromycin and prednisone.  Encouraged to follow-up with PCP at the end of the week and revisit evaluation for COPD diagnosis. The patient has been appropriately medically screened and/or stabilized in the ED. I have low suspicion for any other emergent medical condition which would require further screening, evaluation or treatment in the ED or require inpatient management. At time of discharge the patient is hemodynamically stable and in no acute distress. I have discussed work-up results and diagnosis with patient and answered all questions. Patient is agreeable with discharge plan. We discussed strict return precautions for returning to the emergency department and they verbalized understanding.     Social Determinants of Health:   None         Final Clinical Impression(s) / ED Diagnoses Final diagnoses:  Subacute cough    Rx / DC Orders ED Discharge Orders          Ordered    azithromycin (ZITHROMAX) 250 MG tablet  Daily        02/03/23 2126    predniSONE (DELTASONE) 50 MG tablet  Daily with breakfast        02/03/23 2126    albuterol (VENTOLIN HFA) 108 (90 Base) MCG/ACT inhaler  Every 6 hours PRN        02/03/23 2126              Fabienne Bruns 02/03/23 2139    Bethann Berkshire, MD 02/05/23 1216

## 2023-02-03 NOTE — ED Triage Notes (Signed)
Pt to er, pt states that he is here for a fever and some congestion, states that now he is having some chest tightness and some sob.  States that he also has decreased energy.

## 2023-02-04 ENCOUNTER — Ambulatory Visit: Payer: Medicare Other | Admitting: Family Medicine

## 2023-02-05 ENCOUNTER — Ambulatory Visit: Payer: Medicare Other

## 2023-02-08 ENCOUNTER — Encounter: Payer: Self-pay | Admitting: Family Medicine

## 2023-02-08 ENCOUNTER — Ambulatory Visit (INDEPENDENT_AMBULATORY_CARE_PROVIDER_SITE_OTHER): Payer: Medicare Other | Admitting: Family Medicine

## 2023-02-08 VITALS — BP 138/64 | HR 89 | Temp 98.4°F | Ht 66.0 in | Wt 204.0 lb

## 2023-02-08 DIAGNOSIS — E1142 Type 2 diabetes mellitus with diabetic polyneuropathy: Secondary | ICD-10-CM | POA: Diagnosis not present

## 2023-02-08 DIAGNOSIS — E538 Deficiency of other specified B group vitamins: Secondary | ICD-10-CM

## 2023-02-08 DIAGNOSIS — R059 Cough, unspecified: Secondary | ICD-10-CM

## 2023-02-08 DIAGNOSIS — M109 Gout, unspecified: Secondary | ICD-10-CM | POA: Diagnosis not present

## 2023-02-08 LAB — HEMOGLOBIN A1C: Hgb A1c MFr Bld: 7.1 % — ABNORMAL HIGH (ref 4.6–6.5)

## 2023-02-08 LAB — URIC ACID: Uric Acid, Serum: 6.9 mg/dL (ref 4.0–7.8)

## 2023-02-08 MED ORDER — CYANOCOBALAMIN 1000 MCG/ML IJ SOLN
1000.0000 ug | Freq: Once | INTRAMUSCULAR | Status: AC
  Administered 2023-02-08: 1000 ug via INTRAMUSCULAR

## 2023-02-08 NOTE — Progress Notes (Unsigned)
Cough. ER eval done, rx'd prednisone and zithromax, done with both.  No heart racing with SABA use.  He couldn't tell much difference other than inc cough with use.  Some occ sputum, that is improving.  No fevers.  No chills.  Can take a deep breath.    B12 due, done at OV.   He had limited smoking, quit distant past.  Would defer COPD testing at this point, given limited smoking history and recent events/illness.  Testing now would not change management.  Still on coumadin at baseline.  Had anticoagulation clinic f/u pending. He has had some teeth extracted with some other extractions pending.    H/o gout.  Uric acid pending.  Compliant with allopurinol.  Diabetes:  No meds.  A1c pending.  Hypoglycemic episodes: no Hyperglycemic episodes: no Blood Sugars averaging: ~110-120 eye exam within last year: due, d/w pt.   Meds, vitals, and allergies reviewed.   ROS: Per HPI unless specifically indicated in ROS section   Nad [place] Neck supple, no LA IRR Ctab Abdomen soft.  Nontender. Skin well-perfused.  31 minutes were devoted to patient care in this encounter (this includes time spent reviewing the patient's file/history, interviewing and examining the patient, counseling/reviewing plan with patient).

## 2023-02-08 NOTE — Patient Instructions (Addendum)
I would use a spacer with the inhaler.   I would try using the inhaler only if needed.  Update [place] as needed.    B12 shot today.    Ask the coumadin clinic about the upcoming dental appointment.   Flu shot when well.   Go to the lab on the way out.   If you have mychart we'll likely use that to update you.    Take care.  Glad to see you.

## 2023-02-09 DIAGNOSIS — T560X1A Toxic effect of lead and its compounds, accidental (unintentional), initial encounter: Secondary | ICD-10-CM | POA: Diagnosis not present

## 2023-02-10 DIAGNOSIS — R059 Cough, unspecified: Secondary | ICD-10-CM | POA: Insufficient documentation

## 2023-02-10 NOTE — Assessment & Plan Note (Signed)
Lungs are clear.  Okay for outpatient follow-up. I would use a spacer with albuterol.   I would try using the inhaler only if needed.  Update [place] as needed.

## 2023-02-10 NOTE — Assessment & Plan Note (Signed)
History of.  Continue allopurinol.  See notes on labs.  No current flare.

## 2023-02-10 NOTE — Assessment & Plan Note (Signed)
B12 dose done at office visit.

## 2023-02-10 NOTE — Assessment & Plan Note (Signed)
See notes on A1c.  No change in medications at this point.  Recheck periodically.

## 2023-02-14 ENCOUNTER — Other Ambulatory Visit: Payer: Self-pay | Admitting: Family Medicine

## 2023-02-14 DIAGNOSIS — K222 Esophageal obstruction: Secondary | ICD-10-CM

## 2023-02-14 DIAGNOSIS — R1319 Other dysphagia: Secondary | ICD-10-CM

## 2023-02-18 ENCOUNTER — Other Ambulatory Visit: Payer: Self-pay | Admitting: Family Medicine

## 2023-02-18 DIAGNOSIS — R1319 Other dysphagia: Secondary | ICD-10-CM

## 2023-02-18 DIAGNOSIS — K222 Esophageal obstruction: Secondary | ICD-10-CM

## 2023-02-18 NOTE — Telephone Encounter (Signed)
Prescription Request  02/18/2023  LOV: 02/08/2023  What is the name of the medication or equipment? omeprazole (PRILOSEC) 40 MG capsule  Have you contacted your pharmacy to request a refill? Yes   Which pharmacy would you like this sent to?  [id number] [place], [place] - [address] [place] [address] Phone: [phone] Fax: [phone]   Patient notified that their request is being sent to the clinical staff for review and that they should receive a response within 2 business days.   Please advise at Mobile [phone] (mobile)  Pt's daughter, Wyatt Mage, called requesting refill. Wyatt Mage stated when pt had his heart valve replacement, he couldn't take the meds, so they were discontinues. Tabitha states the pt can start back taking the meds & now needs a refill.

## 2023-02-18 NOTE — Telephone Encounter (Signed)
Please review. The prilosec was stopped but it appears that the patient has been prescribed protonix.

## 2023-02-18 NOTE — Telephone Encounter (Signed)
Please see phone note  

## 2023-02-19 NOTE — Telephone Encounter (Signed)
I denied the prilosec rx since he has rx for protonix.  Thanks.

## 2023-02-21 NOTE — Telephone Encounter (Signed)
Please check on this, to clarify med stoppage versus restart.  If the plan is to restart Prilosec but then stop Protonix, then I can send this prescription.  Please let me know.  Thanks.

## 2023-02-22 NOTE — Telephone Encounter (Signed)
Called and spoke with patients daughter, she states the patient was switched back to omeprazole after the protonix was not helping. He has been taking omeprazole now for a while.

## 2023-02-23 DIAGNOSIS — M25521 Pain in right elbow: Secondary | ICD-10-CM | POA: Diagnosis not present

## 2023-02-23 MED ORDER — OMEPRAZOLE 40 MG PO CPDR: 40.0000 mg | DELAYED_RELEASE_CAPSULE | Freq: Every day | ORAL | 3 refills | Status: DC

## 2023-02-23 NOTE — Addendum Note (Signed)
Addended by: Joaquim Nam on: 02/23/2023 06:37 AM   Modules accepted: Orders

## 2023-02-23 NOTE — Telephone Encounter (Signed)
Sent. Thanks.   

## 2023-03-02 ENCOUNTER — Ambulatory Visit: Payer: Medicare Other | Attending: Cardiology | Admitting: *Deleted

## 2023-03-02 DIAGNOSIS — I482 Chronic atrial fibrillation, unspecified: Secondary | ICD-10-CM | POA: Insufficient documentation

## 2023-03-02 DIAGNOSIS — Z5181 Encounter for therapeutic drug level monitoring: Secondary | ICD-10-CM | POA: Diagnosis not present

## 2023-03-02 DIAGNOSIS — Z952 Presence of prosthetic heart valve: Secondary | ICD-10-CM | POA: Diagnosis not present

## 2023-03-02 LAB — POCT INR: INR: 2.3 (ref 2.0–3.0)

## 2023-03-02 NOTE — Patient Instructions (Addendum)
Description   CALL IF THE PREDNISONE IS INCREASED AND IF YOU GET ANOTHER ANTIBIOTIC. Continue taking warfarin 1 tablet daily except for 1.5 tablets on Sundays and Thursdays. INR in 6 weeks. Call if any new medications or changes [phone]. Allopurinol may be increased to 150 mg Daily, pt confirming with Dr Para March  IF YOU NEED TO HOLD YOUR WARFARIN FOR ANY PROCEDURE PLEASE FAX CLEARANCE FORM TO [phone]

## 2023-03-11 DIAGNOSIS — Z23 Encounter for immunization: Secondary | ICD-10-CM | POA: Diagnosis not present

## 2023-03-22 ENCOUNTER — Telehealth: Payer: Self-pay | Admitting: Cardiovascular Disease

## 2023-03-22 NOTE — Telephone Encounter (Signed)
 Spoke with pts daughter and the pt has an ppt with oral surgeon on Wednesday and does not need to hold warfarin but needs an INR per dtr. Advised will see pt tomorrow.

## 2023-03-22 NOTE — Telephone Encounter (Signed)
 Patient's daughter is requesting call back to see about getting a sooner coumadin  appt scheduled. States that patient has an oral procedure happening this week and they are just requesting INR be checked tomorrow. Needing something sooner than current availability of 01/13.

## 2023-03-23 ENCOUNTER — Other Ambulatory Visit: Payer: Medicare Other

## 2023-03-23 ENCOUNTER — Ambulatory Visit: Payer: Medicare Other | Attending: Cardiovascular Disease

## 2023-03-23 DIAGNOSIS — Z5181 Encounter for therapeutic drug level monitoring: Secondary | ICD-10-CM | POA: Diagnosis not present

## 2023-03-23 DIAGNOSIS — Z952 Presence of prosthetic heart valve: Secondary | ICD-10-CM | POA: Diagnosis not present

## 2023-03-23 DIAGNOSIS — I482 Chronic atrial fibrillation, unspecified: Secondary | ICD-10-CM | POA: Insufficient documentation

## 2023-03-23 LAB — POCT INR: INR: 2.4 (ref 2.0–3.0)

## 2023-03-23 NOTE — Patient Instructions (Signed)
 Continue taking warfarin 1 tablet daily except for 1.5 tablets on Sundays and Thursdays. INR in 6 weeks. Call if any new medications or changes [phone]. Allopurinol  may be increased to 150 mg Daily, pt confirming with Dr Cleatus; Dental Work 03/24/23  IF YOU NEED TO HOLD YOUR WARFARIN FOR ANY PROCEDURE PLEASE FAX CLEARANCE FORM TO [phone]

## 2023-04-19 ENCOUNTER — Ambulatory Visit (HOSPITAL_COMMUNITY)
Admission: RE | Admit: 2023-04-19 | Discharge: 2023-04-19 | Disposition: A | Discharge status: Home / Self Care | Payer: Medicare Other | Source: Ambulatory Visit | Attending: Cardiology | Admitting: Cardiology

## 2023-04-19 DIAGNOSIS — R911 Solitary pulmonary nodule: Secondary | ICD-10-CM | POA: Diagnosis not present

## 2023-04-19 DIAGNOSIS — I700 Atherosclerosis of aorta: Secondary | ICD-10-CM | POA: Diagnosis not present

## 2023-04-21 ENCOUNTER — Other Ambulatory Visit (HOSPITAL_COMMUNITY): Payer: Medicare Other

## 2023-04-21 ENCOUNTER — Ambulatory Visit: Payer: Medicare Other

## 2023-04-22 ENCOUNTER — Other Ambulatory Visit: Payer: Self-pay | Admitting: Cardiovascular Disease

## 2023-04-22 DIAGNOSIS — I482 Chronic atrial fibrillation, unspecified: Secondary | ICD-10-CM

## 2023-04-22 NOTE — Telephone Encounter (Signed)
 Prescription refill request received for warfarin Lov: 09/21/22 [name])  Next INR check: 05/04/23 Warfarin tablet strength: 4mg   Appropriate dose. Refill sent.

## 2023-04-22 NOTE — Progress Notes (Signed)
 [facility]   MULTIDISCIPLINARY HEART VALVE TEAM  Structural Heart Office Note:  .    Date:  04/26/2023  ID:  REGNALD SZCZEPANSKI, DOB 1930-08-18, MRN [id number] PCP: Donnie Galea, MD  [facility] Providers Cardiologist:  Arnoldo Lapping, MD  History of Present Illness: [name] is a 89 y.o. male with a hx of CAD s/p PCI/RCA 03/11/22, CKD stage IIIb, obesity (BMI 32), OSA on CPAP, HTN, HLD, permanent atrial fibrillation on Coumadin  and severe aortic stenosis s/p TAVR who is being seen today for one year follow up.    [name] is followed by Dr. Arlester Ladd. He was seen in 08/2021 with stable symptoms of mild exertional dyspnea. At that time an echocardiogram suggested severe paradoxical low-flow low gradient aortic stenosis. In the setting of his advanced age and stable symptoms, we elected to have him return for 31-month follow-up with a repeat echocardiogram. His most recent echocardiogram 01/30/22 showed EF 65% and severe AS with mean grad 25 mmHg, peak grad 48.9 mmHg, AVA 0.94 cm2, DVI 0.33, SVI 30 as well as mild MS & TR. The patient also reported significant progression in his symptoms with worsening shortness of breath, chest discomfort, and lightheadedness. [facility] 03/11/22 showed two-vessel CAD with severe stenosis of the proximal RCA, treated with PCI using a 3.0 x 24 mm Synergy DES and severe stenosis of the OM branch of the circumflex with recommendations for medical therapy due to lesion complexity. There was moderate nonobstructive diffuse proximal to mid LAD stenosis estimated at 50%. He was discharged on Plavix  and Coumadin . No aspirin  given elevated bleeding risk.    He was then evaluated by the multidisciplinary valve team and felt to have severe, symptomatic aortic stenosis and to be a suitable candidate and is now s/p successful TAVR with a 23 mm Edwards Sapien 3 Ultra Resilia THV via the TF approach on 04/14/22.    Since he was last seen he has been  doing well with no complaints of SOB, chest pain, palpitations, LE edema, orthopnea, bleeding in stool or urine, dizziness, or syncope.   Physical Exam:   VS:  BP (!) 150/82 (BP Location: Right Arm)   Pulse 80   Ht 5' 5.5" (1.664 m)   SpO2 97%   BMI 33.43 kg/m    Wt Readings from Last 3 Encounters:  02/08/23 204 lb (92.5 kg)  02/03/23 205 lb (93 kg)  12/29/22 211 lb 9.6 oz (96 kg)   General: Well developed, well nourished, NAD Lungs:Clear to ausculation bilaterally. No wheezes, rales, or rhonchi. Breathing is unlabored. Cardiovascular: RRR with S1 S2. No murmurs Extremities: No edema.  Neuro: Alert and oriented. No focal deficits. No facial asymmetry. MAE spontaneously. Psych: Responds to questions appropriately with normal affect.    ASSESSMENT AND PLAN: .    Severe AS: Patient doing well with NYHA class I symptoms s/p successful TAVR with a 23 mm Edwards Sapien 3 Ultra Resilia THV via the TF approach on 04/14/22. Echo today with normal LV function at 60-65% with stable 23mm S3UR TAVR valve with a mean gradient of and peak of with no PVL, DI 0.55, and AVA of 2.4cm2. Continue ASA and Coumadin  as well as lifelong dental SBE. Plan follow up with Dr. Arlester Ladd 09/2023.    Mild to moderate MR: Noted on echocardiogram with mean MV gradient at (HR 68) and MVA at 1.2cm2 with severe MAC. Asymptomatic. Echo today with   CAD:  [facility] 03/11/22 showed two-vessel CAD with severe stenosis of the proximal RCA, treated with PCI using a 3.0 x 24 mm Synergy DES and severe stenosis of the OM branch of the circumflex with recommendations for medical therapy due to lesion complexity. Continued ASA and Coumadin . No bleeding concerns or anginal symptoms.   HTN: Elevated today however reports he has not taken his AM medications and home BPs typically run in the 120-130 range.    Permanent atrial fibrillation: Continue AC with Coumadin  and AV nodal blocking agents. Follows with [facility].   CKD stage IIIb: Stable with Cr at baseline.    Pulmonary nodule: Pre TAVR CT showed a "solid apical right upper lobe 0.4 cm pulmonary nodule, new since 05/08/2006 chest CT. Although likely benign, if the patient is high-risk, given the morphology and/or location of this nodule a non-contrast chest CT can be considered in 12 months." Discussed and patient wished for follow up CT. This was performed 2/3 with pending results.   I spent 20 minutes caring for this patient today including face-to-face discussions, ordering and reviewing labs, reviewing records from [facility] and other outside facilities, documenting in the record, and arranging for follow up.   Signed, [name], NP

## 2023-04-26 ENCOUNTER — Ambulatory Visit: Payer: Medicare Other | Admitting: Cardiology

## 2023-04-26 ENCOUNTER — Ambulatory Visit (HOSPITAL_COMMUNITY): Payer: Medicare Other | Attending: Cardiovascular Disease

## 2023-04-26 VITALS — BP 150/82 | HR 80 | Ht 65.5 in | Wt 200.0 lb

## 2023-04-26 DIAGNOSIS — Z952 Presence of prosthetic heart valve: Secondary | ICD-10-CM | POA: Insufficient documentation

## 2023-04-26 DIAGNOSIS — I350 Nonrheumatic aortic (valve) stenosis: Secondary | ICD-10-CM | POA: Diagnosis not present

## 2023-04-26 DIAGNOSIS — N1832 Chronic kidney disease, stage 3b: Secondary | ICD-10-CM

## 2023-04-26 DIAGNOSIS — I10 Essential (primary) hypertension: Secondary | ICD-10-CM

## 2023-04-26 DIAGNOSIS — I2510 Atherosclerotic heart disease of native coronary artery without angina pectoris: Secondary | ICD-10-CM

## 2023-04-26 DIAGNOSIS — I482 Chronic atrial fibrillation, unspecified: Secondary | ICD-10-CM

## 2023-04-26 LAB — ECHOCARDIOGRAM COMPLETE
AR max vel: 2.41 cm2
AV Area VTI: 2.27 cm2
AV Area mean vel: 2.39 cm2
AV Mean grad: 10 mm[Hg]
AV Peak grad: 19.1 mm[Hg]
Ao pk vel: 2.19 m/s
Area-P 1/2: 3.44 cm2
MV VTI: 2.06 cm2
S' Lateral: 2.77 cm

## 2023-04-26 NOTE — Patient Instructions (Signed)
 Medication Instructions:  Your physician recommends that you continue on your current medications as directed. Please refer to the Current Medication list given to you today.  *If you need a refill on your cardiac medications before your next appointment, please call your pharmacy*   Lab Work: NONE  If you have labs (blood work) drawn today and your tests are completely normal, you will receive your results only by: MyChart Message (if you have MyChart) OR A paper copy in the mail If you have any lab test that is abnormal or we need to change your treatment, we will call you to review the results.   Testing/Procedures: NONE   Follow-Up: At [facility], you and your health needs are our priority.  As part of our continuing mission to provide you with exceptional heart care, we have created designated Provider Care Teams.  These Care Teams include your primary Cardiologist (physician) and Advanced Practice Providers (APPs -  Physician Assistants and Nurse Practitioners) who all work together to provide you with the care you need, when you need it.   Your next appointment:   5 month(s)  Provider:   Arnoldo Lapping, MD

## 2023-05-04 ENCOUNTER — Ambulatory Visit: Payer: Medicare Other | Attending: Cardiovascular Disease | Admitting: *Deleted

## 2023-05-04 DIAGNOSIS — Z5181 Encounter for therapeutic drug level monitoring: Secondary | ICD-10-CM | POA: Diagnosis not present

## 2023-05-04 DIAGNOSIS — I482 Chronic atrial fibrillation, unspecified: Secondary | ICD-10-CM

## 2023-05-04 DIAGNOSIS — Z952 Presence of prosthetic heart valve: Secondary | ICD-10-CM | POA: Diagnosis not present

## 2023-05-04 LAB — POCT INR: INR: 2.5 (ref 2.0–3.0)

## 2023-05-04 NOTE — Patient Instructions (Signed)
Description   Continue taking warfarin 1 tablet daily except for 1.5 tablets on Sundays and Thursdays. INR in 6 weeks. Call if any new medications or changes [phone]. Allopurinol may be increased to 150 mg Daily, pt confirming with Dr Para March; Dental Work 03/24/23  IF YOU NEED TO HOLD YOUR WARFARIN FOR ANY PROCEDURE PLEASE FAX CLEARANCE FORM TO [phone]

## 2023-06-05 ENCOUNTER — Other Ambulatory Visit: Payer: Self-pay | Admitting: Cardiovascular Disease

## 2023-06-16 ENCOUNTER — Ambulatory Visit: Payer: Medicare Other | Attending: Cardiovascular Disease | Admitting: *Deleted

## 2023-06-16 DIAGNOSIS — Z952 Presence of prosthetic heart valve: Secondary | ICD-10-CM | POA: Insufficient documentation

## 2023-06-16 DIAGNOSIS — Z5181 Encounter for therapeutic drug level monitoring: Secondary | ICD-10-CM | POA: Diagnosis not present

## 2023-06-16 DIAGNOSIS — I482 Chronic atrial fibrillation, unspecified: Secondary | ICD-10-CM | POA: Diagnosis not present

## 2023-06-16 LAB — POCT INR: INR: 2.1 (ref 2.0–3.0)

## 2023-06-16 NOTE — Patient Instructions (Signed)
 Description   Continue taking warfarin 1 tablet daily except for 1.5 tablets on Sundays and Thursdays. INR in 6 weeks. Call if any new medications or changes [phone]. Allopurinol may be increased to 150 mg Daily, pt confirming with Dr Para March; Dental Work 03/24/23  IF YOU NEED TO HOLD YOUR WARFARIN FOR ANY PROCEDURE PLEASE FAX CLEARANCE FORM TO [phone]

## 2023-06-30 DIAGNOSIS — H04203 Unspecified epiphora, bilateral lacrimal glands: Secondary | ICD-10-CM | POA: Diagnosis not present

## 2023-06-30 DIAGNOSIS — H53143 Visual discomfort, bilateral: Secondary | ICD-10-CM | POA: Diagnosis not present

## 2023-06-30 DIAGNOSIS — H1045 Other chronic allergic conjunctivitis: Secondary | ICD-10-CM | POA: Diagnosis not present

## 2023-06-30 DIAGNOSIS — H3562 Retinal hemorrhage, left eye: Secondary | ICD-10-CM | POA: Diagnosis not present

## 2023-07-14 DIAGNOSIS — D631 Anemia in chronic kidney disease: Secondary | ICD-10-CM | POA: Diagnosis not present

## 2023-07-14 DIAGNOSIS — R809 Proteinuria, unspecified: Secondary | ICD-10-CM | POA: Diagnosis not present

## 2023-07-14 DIAGNOSIS — N1832 Chronic kidney disease, stage 3b: Secondary | ICD-10-CM | POA: Diagnosis not present

## 2023-07-14 DIAGNOSIS — I129 Hypertensive chronic kidney disease with stage 1 through stage 4 chronic kidney disease, or unspecified chronic kidney disease: Secondary | ICD-10-CM | POA: Diagnosis not present

## 2023-07-14 DIAGNOSIS — M109 Gout, unspecified: Secondary | ICD-10-CM | POA: Diagnosis not present

## 2023-07-14 DIAGNOSIS — Z952 Presence of prosthetic heart valve: Secondary | ICD-10-CM | POA: Diagnosis not present

## 2023-07-14 DIAGNOSIS — N189 Chronic kidney disease, unspecified: Secondary | ICD-10-CM | POA: Diagnosis not present

## 2023-07-14 DIAGNOSIS — N2581 Secondary hyperparathyroidism of renal origin: Secondary | ICD-10-CM | POA: Diagnosis not present

## 2023-07-14 LAB — BASIC METABOLIC PANEL WITH GFR: Creatinine: 1.9 — AB (ref 0.6–1.3)

## 2023-07-14 LAB — CBC AND DIFFERENTIAL: Platelets: 117 10*3/uL — AB (ref 150–400)

## 2023-07-15 LAB — LAB REPORT - SCANNED: EGFR: 33

## 2023-07-28 ENCOUNTER — Ambulatory Visit: Payer: Self-pay | Admitting: Family Medicine

## 2023-07-28 ENCOUNTER — Ambulatory Visit: Attending: Cardiovascular Disease | Admitting: *Deleted

## 2023-07-28 DIAGNOSIS — I482 Chronic atrial fibrillation, unspecified: Secondary | ICD-10-CM | POA: Diagnosis not present

## 2023-07-28 DIAGNOSIS — Z952 Presence of prosthetic heart valve: Secondary | ICD-10-CM | POA: Diagnosis not present

## 2023-07-28 LAB — POCT INR: INR: 2.6 (ref 2.0–3.0)

## 2023-07-28 NOTE — Patient Instructions (Signed)
 Description   Continue taking warfarin 1 tablet daily except for 1.5 tablets on Sundays and Thursdays. INR in 6 weeks. Call if any new medications or changes [phone]. Allopurinol may be increased to 150 mg Daily, pt confirming with Dr Para March; Dental Work 03/24/23  IF YOU NEED TO HOLD YOUR WARFARIN FOR ANY PROCEDURE PLEASE FAX CLEARANCE FORM TO [phone]

## 2023-07-29 ENCOUNTER — Encounter: Payer: Self-pay | Admitting: Family Medicine

## 2023-08-03 ENCOUNTER — Ambulatory Visit (INDEPENDENT_AMBULATORY_CARE_PROVIDER_SITE_OTHER)

## 2023-08-03 DIAGNOSIS — E538 Deficiency of other specified B group vitamins: Secondary | ICD-10-CM | POA: Diagnosis not present

## 2023-08-03 MED ORDER — CYANOCOBALAMIN 1000 MCG/ML IJ SOLN
1000.0000 ug | Freq: Once | INTRAMUSCULAR | Status: AC
  Administered 2023-08-03: 1000 ug via INTRAMUSCULAR

## 2023-08-03 NOTE — Progress Notes (Signed)
 Per orders of Dr. Crawford Givens, injection of vitamin b 12 given by Lewanda Rife in right deltoid. Patient tolerated injection well. Patient will make appointment for 1 month.

## 2023-08-06 DIAGNOSIS — M25562 Pain in left knee: Secondary | ICD-10-CM | POA: Diagnosis not present

## 2023-08-06 DIAGNOSIS — M25561 Pain in right knee: Secondary | ICD-10-CM | POA: Diagnosis not present

## 2023-08-20 DIAGNOSIS — N1832 Chronic kidney disease, stage 3b: Secondary | ICD-10-CM | POA: Diagnosis not present

## 2023-08-31 ENCOUNTER — Emergency Department (HOSPITAL_COMMUNITY)
Admission: EM | Admit: 2023-08-31 | Discharge: 2023-08-31 | Disposition: A | Discharge status: Home / Self Care | Attending: Emergency Medicine | Admitting: Emergency Medicine

## 2023-08-31 ENCOUNTER — Other Ambulatory Visit: Payer: Self-pay

## 2023-08-31 ENCOUNTER — Encounter (HOSPITAL_COMMUNITY): Payer: Self-pay

## 2023-08-31 DIAGNOSIS — I83892 Varicose veins of left lower extremities with other complications: Secondary | ICD-10-CM | POA: Insufficient documentation

## 2023-08-31 DIAGNOSIS — R58 Hemorrhage, not elsewhere classified: Secondary | ICD-10-CM | POA: Diagnosis not present

## 2023-08-31 DIAGNOSIS — I83899 Varicose veins of unspecified lower extremities with other complications: Secondary | ICD-10-CM

## 2023-08-31 NOTE — ED Triage Notes (Signed)
 Pt bibgcems from home. Pt kicked off his shoes and noticed his achilles heel was bleeding profusely. Soaked though 3-4 bath towels. Patient on blood thinner warfarin. No fall or loc.  Bp 132/64 Hr 78 16 rr 95% ra

## 2023-08-31 NOTE — ED Provider Notes (Signed)
 [facility] Emergency Department Provider Note MRN:  [id number]  Arrival date & time: 08/31/23     Chief Complaint   Laceration   History of Present Illness   [name] is a 89 y.o. year-old male presents to the ED with chief complaint of bleeding from a varicose vein.  He is anticoagulated on Coumadin .  He states that he was putting his shoe on tonight and noticed the bleeding around his left ankle.  He tried to control it with washcloths at home, but was unable to do so.  He called the fire department and they put on a bandage.  He is no longer bleeding now.  He denies any other symptoms..  History provided by patient.   Review of Systems  Pertinent positive and negative review of systems noted in HPI.    Physical Exam   Vitals:   08/31/23 0045 08/31/23 0100  BP:  121/72  Pulse:  (!) 54  Resp:    Temp:    SpO2: 96%     CONSTITUTIONAL:  non toxic-appearing, NAD NEURO:  Alert and oriented x 3, CN 3-12 grossly intact EYES:  eyes equal and reactive ENT/NECK:  Supple, no stridor  CARDIO:  normal rate, appears well-perfused  PULM:  No respiratory distress,  GI/GU:  non-distended,  MSK/SPINE:  No gross deformities, no edema, moves all extremities  SKIN:  no rash, atraumatic   *Additional and/or pertinent findings included in MDM below  Diagnostic and Interventional Summary    EKG Interpretation Date/Time:    Ventricular Rate:    PR Interval:    QRS Duration:    QT Interval:    QTC Calculation:   R Axis:      Text Interpretation:         Labs Reviewed - No data to display  No orders to display    Medications - No data to display   Procedures  /  Critical Care Procedures  ED Course and Medical Decision Making  I have reviewed the triage vital signs, the nursing notes, and pertinent available records from the EMR.  Social Determinants Affecting Complexity of Care: Patient has no clinically significant social determinants  affecting this chief complaint..   ED Course:    Medical Decision Making Patient here with bleeding from a varicose vein.  He is anticoagulated on Coumadin .  Bleeding is controlled now.  I undressed the wound, it is still not bleeding.  I was able to clean the skin.  Still no bleeding.  I applied some quick clot powder and to redress the wound.  It is just a very small punctate wound to a varicose vein.  No further treatment needed tonight.  Will discharge home with PCP follow-up.  I have advised patient to keep the dressing on for the next 12 hours.         Consultants: No consultations were needed in caring for this patient.   Treatment and Plan: Emergency department workup does not suggest an emergent condition requiring admission or immediate intervention beyond  what has been performed at this time. The patient is safe for discharge and has  been instructed to return immediately for worsening symptoms, change in  symptoms or any other concerns    Final Clinical Impressions(s) / ED Diagnoses     ICD-10-CM   1. Bleeding from varicose vein  W09.811       ED Discharge Orders     None  Discharge Instructions Discussed with and Provided to Patient:   Discharge Instructions   None      Sherel Dikes, PA-C 08/31/23 0106    Earma Gloss, MD 08/31/23 [phone]

## 2023-09-02 ENCOUNTER — Ambulatory Visit

## 2023-09-08 ENCOUNTER — Ambulatory Visit: Attending: Cardiovascular Disease | Admitting: *Deleted

## 2023-09-08 DIAGNOSIS — I482 Chronic atrial fibrillation, unspecified: Secondary | ICD-10-CM | POA: Insufficient documentation

## 2023-09-08 DIAGNOSIS — Z952 Presence of prosthetic heart valve: Secondary | ICD-10-CM | POA: Diagnosis not present

## 2023-09-08 LAB — POCT INR: INR: 1.6 — AB (ref 2.0–3.0)

## 2023-09-08 NOTE — Progress Notes (Signed)
Please see anticoagulation encounter.

## 2023-09-08 NOTE — Patient Instructions (Signed)
 Description   Today take 1.5 tablets of warfarin then continue taking warfarin 1 tablet daily except for 1.5 tablets on Sundays and Thursdays. INR in 4 weeks. Call if any new medications or changes [phone]. Allopurinol  may be increased to 150 mg Daily, pt confirming with Dr Cleatus.  IF YOU NEED TO HOLD YOUR WARFARIN FOR ANY PROCEDURE PLEASE FAX CLEARANCE FORM TO [phone]

## 2023-09-28 ENCOUNTER — Ambulatory Visit (INDEPENDENT_AMBULATORY_CARE_PROVIDER_SITE_OTHER): Admitting: Family Medicine

## 2023-09-28 ENCOUNTER — Encounter: Payer: Self-pay | Admitting: Family Medicine

## 2023-09-28 VITALS — BP 136/78 | HR 75 | Temp 98.8°F | Ht 63.58 in | Wt 195.2 lb

## 2023-09-28 DIAGNOSIS — R399 Unspecified symptoms and signs involving the genitourinary system: Secondary | ICD-10-CM | POA: Diagnosis not present

## 2023-09-28 DIAGNOSIS — M109 Gout, unspecified: Secondary | ICD-10-CM | POA: Diagnosis not present

## 2023-09-28 DIAGNOSIS — Z952 Presence of prosthetic heart valve: Secondary | ICD-10-CM

## 2023-09-28 DIAGNOSIS — N1832 Chronic kidney disease, stage 3b: Secondary | ICD-10-CM

## 2023-09-28 DIAGNOSIS — G4733 Obstructive sleep apnea (adult) (pediatric): Secondary | ICD-10-CM

## 2023-09-28 DIAGNOSIS — E538 Deficiency of other specified B group vitamins: Secondary | ICD-10-CM

## 2023-09-28 DIAGNOSIS — Z7901 Long term (current) use of anticoagulants: Secondary | ICD-10-CM

## 2023-09-28 DIAGNOSIS — E1142 Type 2 diabetes mellitus with diabetic polyneuropathy: Secondary | ICD-10-CM | POA: Diagnosis not present

## 2023-09-28 MED ORDER — TAMSULOSIN HCL 0.4 MG PO CAPS: 0.4000 mg | ORAL_CAPSULE | Freq: Every day | ORAL | 3 refills | Status: AC

## 2023-09-28 MED ORDER — COLCHICINE 0.6 MG PO TABS: ORAL_TABLET | ORAL | 1 refills | Status: DC

## 2023-09-28 MED ORDER — CYANOCOBALAMIN 1000 MCG/ML IJ SOLN
1000.0000 ug | Freq: Once | INTRAMUSCULAR | Status: AC
  Administered 2023-09-28: 1000 ug via INTRAMUSCULAR

## 2023-09-28 MED ORDER — LISINOPRIL 10 MG PO TABS: ORAL_TABLET | ORAL | Status: DC

## 2023-09-28 NOTE — Patient Instructions (Addendum)
 Please check on the lisinopril  and update me about taking that- either way, let me know.   Labs then B12 shot.  Likely B12 shot in about 1 month.  Take care.  Glad to see you.

## 2023-09-28 NOTE — Progress Notes (Unsigned)
 He thought he was off lisinopril .  D/w pt at OV.   I'll check with other docs about lisinopril  use, renal and cardiology.    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  Not SOB.  No BLE edema.    Gout.  Still on allopurinol .  No recent flares. Compliant with allopurinol .    CKD. Still seeing kidney clinic.  Avoids nsaids.    Anticoagulation.  No bleeding except for small area from varicose vein.    OSA on CPAP, used nightly.  Compliant.    DM2.  No meds.  Labs pending.   LUTS improved with flomax .  Rx sent.    Meds, vitals, and allergies reviewed.   ROS: Per HPI unless specifically indicated in ROS section   Chronic gouty changes on the B elbows.   IRR.    Diabetic foot exam: Normal inspection except for prev R toe amputation x2 No skin breakdown No calluses  Normal DP pulses Normal sensation to light touch but dec to monofilament Nails normal

## 2023-09-29 ENCOUNTER — Ambulatory Visit: Payer: Self-pay | Admitting: Family Medicine

## 2023-09-29 ENCOUNTER — Telehealth: Payer: Self-pay | Admitting: Family Medicine

## 2023-09-29 ENCOUNTER — Telehealth: Payer: Self-pay

## 2023-09-29 DIAGNOSIS — E1142 Type 2 diabetes mellitus with diabetic polyneuropathy: Secondary | ICD-10-CM

## 2023-09-29 LAB — COMPREHENSIVE METABOLIC PANEL WITH GFR
ALT: 11 U/L (ref 0–53)
AST: 17 U/L (ref 0–37)
Albumin: 4.2 g/dL (ref 3.5–5.2)
Alkaline Phosphatase: 117 U/L (ref 39–117)
BUN: 21 mg/dL (ref 6–23)
CO2: 31 meq/L (ref 19–32)
Calcium: 9 mg/dL (ref 8.4–10.5)
Chloride: 98 meq/L (ref 96–112)
Creatinine, Ser: 1.42 mg/dL (ref 0.40–1.50)
GFR: 43.43 mL/min — ABNORMAL LOW (ref >60.00)
Glucose, Bld: 73 mg/dL (ref 70–99)
Potassium: 4.2 meq/L (ref 3.5–5.1)
Sodium: 139 meq/L (ref 135–145)
Total Bilirubin: 0.6 mg/dL (ref 0.2–1.2)
Total Protein: 7.3 g/dL (ref 6.0–8.3)

## 2023-09-29 LAB — LIPID PANEL
Cholesterol: 187 mg/dL (ref 0–200)
HDL: 26 mg/dL — ABNORMAL LOW (ref >39.00)
LDL Cholesterol: 120 mg/dL — ABNORMAL HIGH (ref 0–99)
NonHDL: 160.57
Total CHOL/HDL Ratio: 7
Triglycerides: 205 mg/dL — ABNORMAL HIGH (ref 0.0–149.0)
VLDL: 41 mg/dL — ABNORMAL HIGH (ref 0.0–40.0)

## 2023-09-29 LAB — HEMOGLOBIN A1C: Hgb A1c MFr Bld: 6.8 % — ABNORMAL HIGH (ref 4.6–6.5)

## 2023-09-29 LAB — URIC ACID: Uric Acid, Serum: 6.9 mg/dL (ref 4.0–7.8)

## 2023-09-29 LAB — VITAMIN B12: Vitamin B-12: 304 pg/mL (ref 211–911)

## 2023-09-29 NOTE — Assessment & Plan Note (Signed)
 Still on allopurinol .  No recent flares. Compliant with allopurinol .

## 2023-09-29 NOTE — Assessment & Plan Note (Signed)
 Not short of breath.  No chest pain.  No lower extremity edema.  See notes on labs.

## 2023-09-29 NOTE — Telephone Encounter (Signed)
 Received call from lab not able to process her CBC. He does not drive. He will check with daughter and see when she can get a ride. He will call and set up appointment for CBC. Ok to place new future order.

## 2023-09-29 NOTE — Telephone Encounter (Signed)
 AV- please check with renal clinic- Dr. Dennise.  I am also sending a copy of this note to Dr. Wonda.  I wanted to verify that he was off lisinopril .  His creatinine is not normal but it is improved to 1.4 on labs this week.    If he is off lisinopril , I need their input about potentially restarting it at a low dose.  Thanks.

## 2023-09-29 NOTE — Assessment & Plan Note (Signed)
 No bleeding except from previous episode of varicose vein that resolved.  Continue warfarin as is.

## 2023-09-29 NOTE — Telephone Encounter (Signed)
Cbc ordered  Thanks!

## 2023-09-29 NOTE — Assessment & Plan Note (Signed)
 Continue CPAP as is.  Compliant.

## 2023-09-29 NOTE — Assessment & Plan Note (Signed)
 Continue positive airway pressure use at night.  Compliant.

## 2023-09-29 NOTE — Assessment & Plan Note (Signed)
 Recheck creatinine pending.  Avoids NSAIDs.  Off lisinopril  currently.

## 2023-09-29 NOTE — Assessment & Plan Note (Signed)
 [facility] cautions discussed with patient.  See notes on labs.

## 2023-09-29 NOTE — Assessment & Plan Note (Signed)
Continue Flomax as is. ?

## 2023-09-29 NOTE — Assessment & Plan Note (Signed)
 See notes on labs.

## 2023-09-29 NOTE — Addendum Note (Signed)
 Addended by: [name] on: 09/29/2023 01:57 PM   Modules accepted: Orders

## 2023-09-30 ENCOUNTER — Ambulatory Visit: Payer: Self-pay | Admitting: Family Medicine

## 2023-09-30 ENCOUNTER — Other Ambulatory Visit (INDEPENDENT_AMBULATORY_CARE_PROVIDER_SITE_OTHER)

## 2023-09-30 DIAGNOSIS — E1142 Type 2 diabetes mellitus with diabetic polyneuropathy: Secondary | ICD-10-CM | POA: Diagnosis not present

## 2023-09-30 DIAGNOSIS — D696 Thrombocytopenia, unspecified: Secondary | ICD-10-CM

## 2023-09-30 DIAGNOSIS — D691 Qualitative platelet defects: Secondary | ICD-10-CM

## 2023-09-30 NOTE — Addendum Note (Signed)
 Addended by: ISADORA RAISIN on: 09/30/2023 11:52 AM   Modules accepted: Orders

## 2023-09-30 NOTE — Telephone Encounter (Signed)
 Progress notes and message sent to Dr. Dennise office. Awaiting input

## 2023-10-01 ENCOUNTER — Other Ambulatory Visit

## 2023-10-01 ENCOUNTER — Telehealth: Payer: Self-pay

## 2023-10-01 ENCOUNTER — Ambulatory Visit

## 2023-10-01 DIAGNOSIS — D691 Qualitative platelet defects: Secondary | ICD-10-CM | POA: Diagnosis not present

## 2023-10-01 LAB — CBC WITH DIFFERENTIAL/PLATELET
Absolute Lymphocytes: 1376 {cells}/uL (ref 850–3900)
Absolute Monocytes: 722 {cells}/uL (ref 200–950)
Basophils Absolute: 38 {cells}/uL (ref 0–200)
Basophils Relative: 0.5 %
Eosinophils Absolute: 312 {cells}/uL (ref 15–500)
Eosinophils Relative: 4.1 %
HCT: 44.8 % (ref 38.5–50.0)
Hemoglobin: 14.6 g/dL (ref 13.2–17.1)
MCH: 28.5 pg (ref 27.0–33.0)
MCHC: 32.6 g/dL (ref 32.0–36.0)
MCV: 87.5 fL (ref 80.0–100.0)
MPV: 11.7 fL (ref 7.5–12.5)
Monocytes Relative: 9.5 %
Neutro Abs: 5153 {cells}/uL (ref 1500–7800)
Neutrophils Relative %: 67.8 %
Platelets: 70 Thousand/uL — ABNORMAL LOW (ref 140–400)
RBC: 5.12 Million/uL (ref 4.20–5.80)
RDW: 13.7 % (ref 11.0–15.0)
Total Lymphocyte: 18.1 %
WBC: 7.6 Thousand/uL (ref 3.8–10.8)

## 2023-10-01 LAB — TIQ-MISC

## 2023-10-01 NOTE — Addendum Note (Signed)
 Addended by: HOPE VEVA PARAS on: 10/01/2023 08:28 AM   Modules accepted: Orders

## 2023-10-01 NOTE — Telephone Encounter (Signed)
 Copied from CRM [id number]. Topic: General - Other >> Oct 01, 2023  9:08 AM Revonda D wrote: Reason for CRM: Paulette with [facility] is calling to verify an order requested for the pt. Paulette stated that an order for CBC with platelet was ordered and it requires additional testing. She stated they need a serum specimen but they didn't receive it. Callback number is [phone], Reference number is [id number] E.

## 2023-10-01 NOTE — Telephone Encounter (Signed)
 Spoke to [facility] this morning. Additional testing is not needed.

## 2023-10-04 LAB — PERIPHERAL BLOOD SMEAR REVIEW

## 2023-10-06 ENCOUNTER — Ambulatory Visit: Attending: Cardiovascular Disease

## 2023-10-06 DIAGNOSIS — I482 Chronic atrial fibrillation, unspecified: Secondary | ICD-10-CM | POA: Insufficient documentation

## 2023-10-06 LAB — POCT INR: INR: 2.3 (ref 2.0–3.0)

## 2023-10-06 NOTE — Progress Notes (Signed)
 INR 2.3. Please see anticoagulation encounter

## 2023-10-06 NOTE — Patient Instructions (Signed)
 Description   Continue taking warfarin 1 tablet daily except for 1.5 tablets on Sundays and Thursdays.  Recheck INR in 5 weeks. Call if any new medications or changes [phone]. Allopurinol  may be increased to 150 mg Daily, pt confirming with Dr Cleatus.  IF YOU NEED TO HOLD YOUR WARFARIN FOR ANY PROCEDURE PLEASE FAX CLEARANCE FORM TO [phone]

## 2023-10-10 IMAGING — DX DG CHEST 2V
2 series · 2 of 2 positions shown · non-contrast
Comparison: 09/17/2020

CLINICAL DATA: Cough for the past 2 weeks.

EXAM:
CHEST - 2 VIEW

[chest pa]
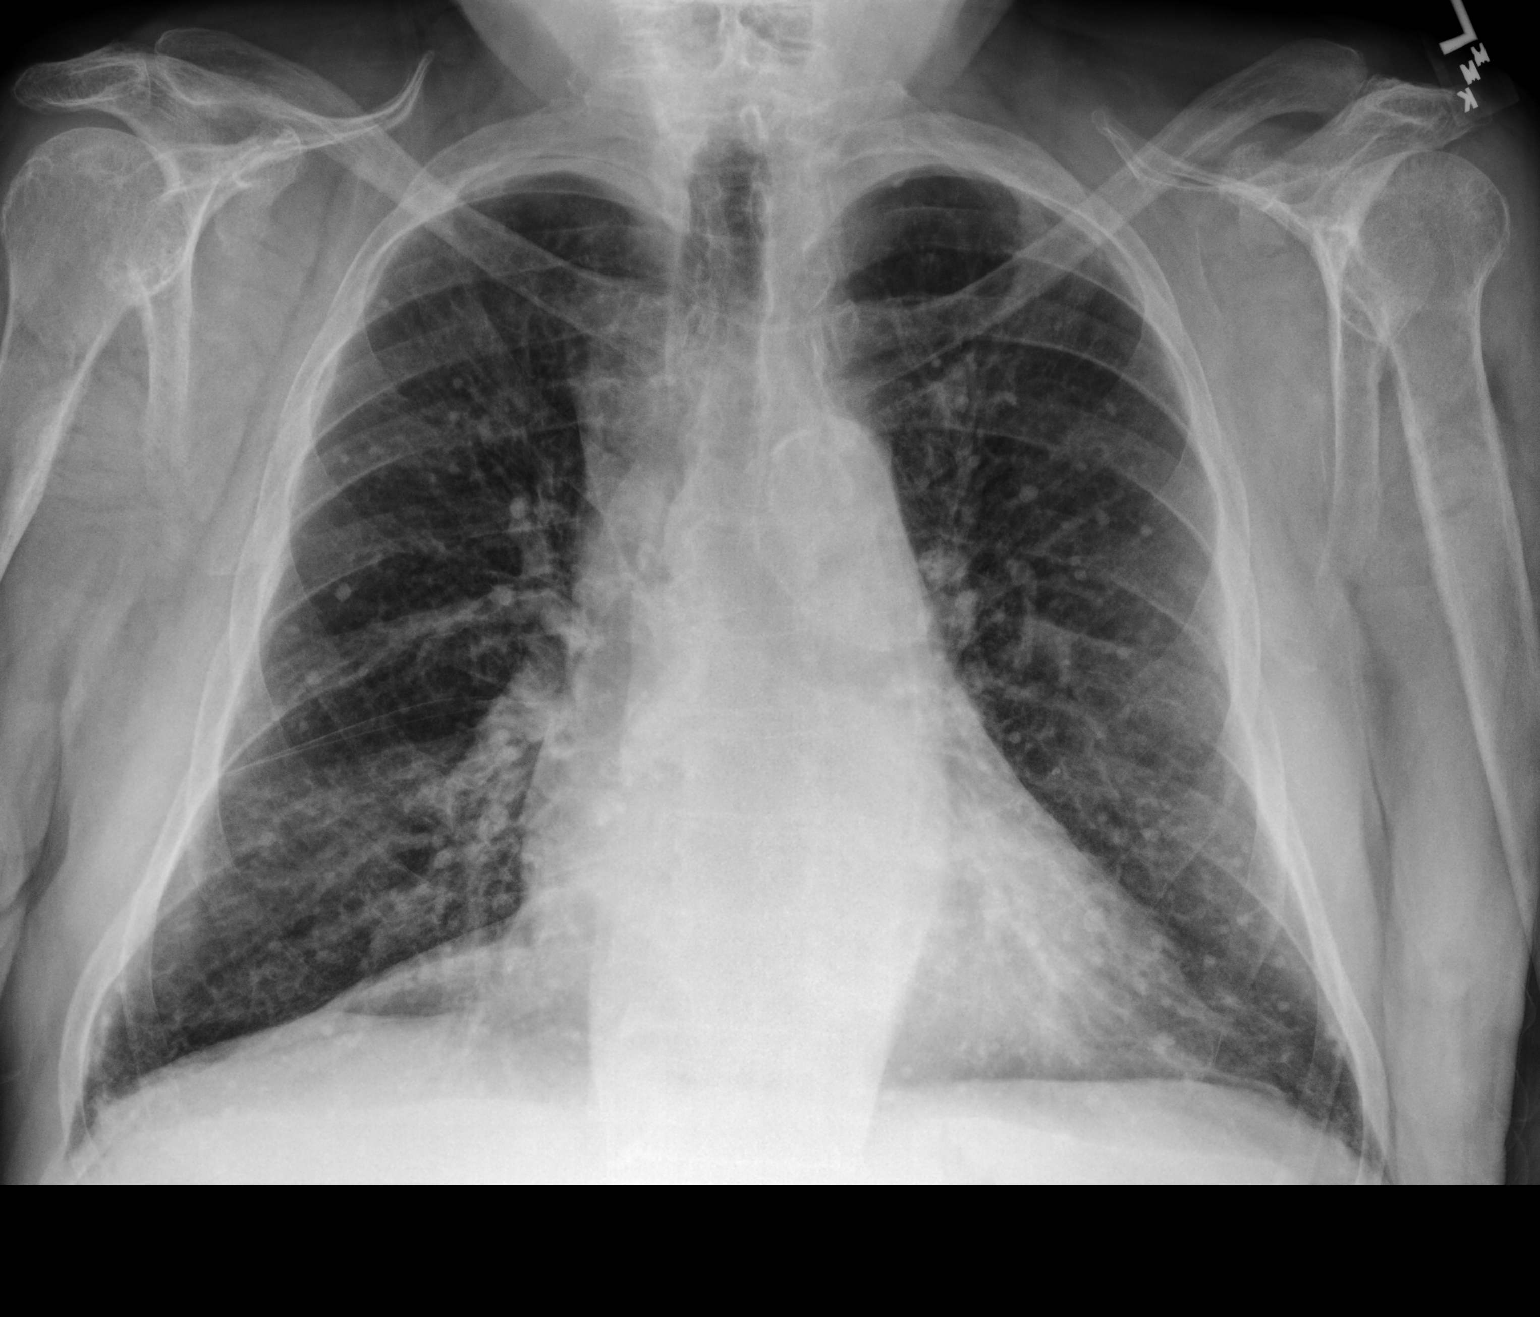

[chest lat]
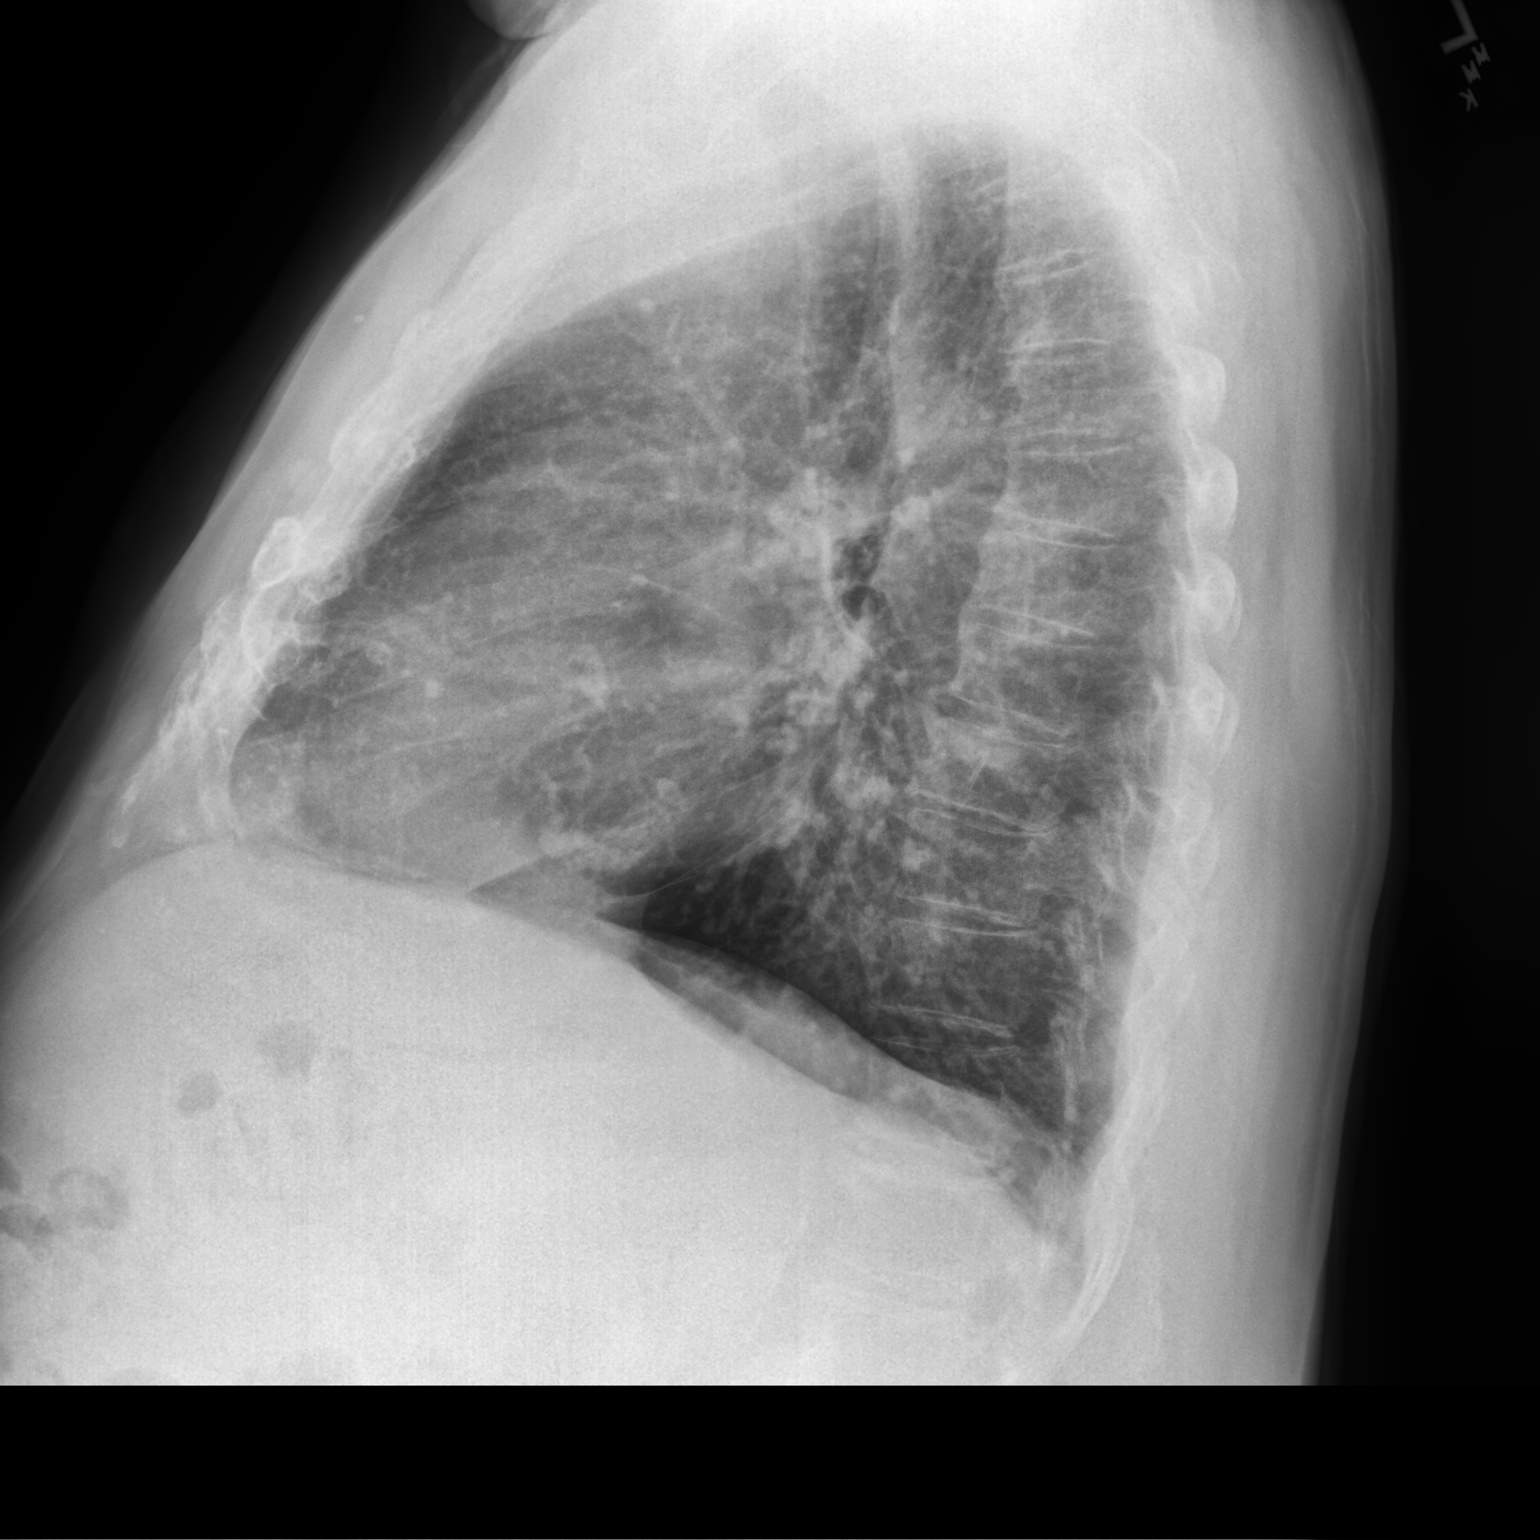

[2 of 2 positions shown; findings below may reference images not displayed]

FINDINGS: Normal sized heart. Tortuous and calcified thoracic aorta. No
significant change in multiple small bilateral calcified
granulomata. Otherwise, clear lungs. Thoracic spine degenerative
changes.
IMPRESSION: No acute abnormality.

## 2023-10-10 NOTE — Telephone Encounter (Signed)
 Please let me know what you hear from the renal clinic about potentially restarting lisinopril  at a lower dose.  Cardiology would be okay with restarting it from their perspective, assuming his follow-up labs and blood pressure were reasonable.  Thanks.

## 2023-10-13 NOTE — Telephone Encounter (Signed)
 Left message at Dr. Dennise office about his suggesting in restarting lisinopril 

## 2023-10-19 NOTE — Telephone Encounter (Signed)
 Reached out to Dr. Dennise office and left message awaiting his input. Will office send over last office notes.

## 2023-11-05 ENCOUNTER — Other Ambulatory Visit (INDEPENDENT_AMBULATORY_CARE_PROVIDER_SITE_OTHER)

## 2023-11-05 DIAGNOSIS — D696 Thrombocytopenia, unspecified: Secondary | ICD-10-CM | POA: Diagnosis not present

## 2023-11-05 NOTE — Addendum Note (Signed)
 Addended by: LORELLE ROCKY BRAVO on: 11/05/2023 03:03 PM   Modules accepted: Orders

## 2023-11-06 LAB — CBC WITH DIFFERENTIAL/PLATELET
Absolute Lymphocytes: 1598 {cells}/uL (ref 850–3900)
Absolute Monocytes: 700 {cells}/uL (ref 200–950)
Basophils Absolute: 41 {cells}/uL (ref 0–200)
Basophils Relative: 0.6 %
Eosinophils Absolute: 272 {cells}/uL (ref 15–500)
Eosinophils Relative: 4 %
HCT: 43.9 % (ref 38.5–50.0)
Hemoglobin: 14.3 g/dL (ref 13.2–17.1)
MCH: 28.5 pg (ref 27.0–33.0)
MCHC: 32.6 g/dL (ref 32.0–36.0)
MCV: 87.5 fL (ref 80.0–100.0)
MPV: 11.7 fL (ref 7.5–12.5)
Monocytes Relative: 10.3 %
Neutro Abs: 4189 {cells}/uL (ref 1500–7800)
Neutrophils Relative %: 61.6 %
Platelets: 100 Thousand/uL — ABNORMAL LOW (ref 140–400)
RBC: 5.02 Million/uL (ref 4.20–5.80)
RDW: 14.1 % (ref 11.0–15.0)
Total Lymphocyte: 23.5 %
WBC: 6.8 Thousand/uL (ref 3.8–10.8)

## 2023-11-06 LAB — SPECIMEN ID NOTIFICATION MISSING 2ND ID

## 2023-11-07 ENCOUNTER — Ambulatory Visit: Payer: Self-pay | Admitting: Family Medicine

## 2023-11-09 ENCOUNTER — Telehealth: Payer: Self-pay | Admitting: Cardiovascular Disease

## 2023-11-09 ENCOUNTER — Ambulatory Visit: Attending: Cardiology | Admitting: Pharmacist

## 2023-11-09 DIAGNOSIS — Z952 Presence of prosthetic heart valve: Secondary | ICD-10-CM | POA: Diagnosis not present

## 2023-11-09 DIAGNOSIS — I482 Chronic atrial fibrillation, unspecified: Secondary | ICD-10-CM | POA: Insufficient documentation

## 2023-11-09 LAB — POCT INR: INR: 1.9 — AB (ref 2.0–3.0)

## 2023-11-09 MED ORDER — DILTIAZEM HCL ER COATED BEADS 180 MG PO CP24: 180.0000 mg | ORAL_CAPSULE | Freq: Every day | ORAL | 1 refills | Status: DC

## 2023-11-09 NOTE — Telephone Encounter (Signed)
*  STAT* If patient is at the pharmacy, call can be transferred to refill team.   1. Which medications need to be refilled? (please list name of each medication and dose if known) diltiazem  (CARDIZEM  CD) 180 MG 24 hr capsule    2. Would you like to learn more about the convenience, safety, & potential cost savings by using the [facility]?     3. Are you open to using the [facility] (Type [facility]. ).   4. Which pharmacy/location (including street and city if local pharmacy) is medication to be sent to? [facility] [phone] - [place], [place] - [address]    5. Do they need a 30 day or 90 day supply? 90 day

## 2023-11-09 NOTE — Progress Notes (Signed)
 INR 1.9; Please see anticoagulation encounter   Description   Take 1 and 1/2 tablets today and then continue taking warfarin 1 tablet daily except for 1.5 tablets on Sundays and Thursdays.  Recheck INR in 3 weeks. Call if any new medications or changes [phone]. Allopurinol  may be increased to 150 mg Daily, pt confirming with Dr Cleatus.  IF YOU NEED TO HOLD YOUR WARFARIN FOR ANY PROCEDURE PLEASE FAX CLEARANCE FORM TO [phone]

## 2023-11-09 NOTE — Telephone Encounter (Signed)
 RX sent in

## 2023-11-09 NOTE — Patient Instructions (Addendum)
 Description   Take 1 and 1/2 tablets today and then continue taking warfarin 1 tablet daily except for 1.5 tablets on Sundays and Thursdays.  Recheck INR in 3 weeks. Call if any new medications or changes [phone]. Allopurinol  may be increased to 150 mg Daily, pt confirming with Dr Cleatus.  IF YOU NEED TO HOLD YOUR WARFARIN FOR ANY PROCEDURE PLEASE FAX CLEARANCE FORM TO [phone]

## 2023-11-10 ENCOUNTER — Encounter

## 2023-11-12 ENCOUNTER — Other Ambulatory Visit

## 2023-11-26 DIAGNOSIS — M170 Bilateral primary osteoarthritis of knee: Secondary | ICD-10-CM | POA: Diagnosis not present

## 2023-12-01 ENCOUNTER — Ambulatory Visit: Attending: Cardiology | Admitting: Pharmacist

## 2023-12-01 DIAGNOSIS — Z952 Presence of prosthetic heart valve: Secondary | ICD-10-CM | POA: Insufficient documentation

## 2023-12-01 DIAGNOSIS — I482 Chronic atrial fibrillation, unspecified: Secondary | ICD-10-CM | POA: Insufficient documentation

## 2023-12-01 LAB — POCT INR: INR: 2.1 (ref 2.0–3.0)

## 2023-12-01 NOTE — Patient Instructions (Addendum)
 Description   INR 2.1:  continue taking warfarin 1 tablet daily except for 1.5 tablets on Sundays and Thursdays.  Recheck INR in 4 weeks. Call if any new medications or changes [phone].  IF YOU NEED TO HOLD YOUR WARFARIN FOR ANY PROCEDURE PLEASE FAX CLEARANCE FORM TO [phone]

## 2023-12-01 NOTE — Progress Notes (Signed)
 Description   INR 2.1:  continue taking warfarin 1 tablet daily except for 1.5 tablets on Sundays and Thursdays.  Recheck INR in 4 weeks. Call if any new medications or changes [phone].  IF YOU NEED TO HOLD YOUR WARFARIN FOR ANY PROCEDURE PLEASE FAX CLEARANCE FORM TO [phone]

## 2023-12-08 ENCOUNTER — Ambulatory Visit (INDEPENDENT_AMBULATORY_CARE_PROVIDER_SITE_OTHER): Payer: Medicare Other

## 2023-12-08 VITALS — Ht 65.5 in | Wt 195.0 lb

## 2023-12-08 DIAGNOSIS — Z0000 Encounter for general adult medical examination without abnormal findings: Secondary | ICD-10-CM | POA: Diagnosis not present

## 2023-12-08 NOTE — Progress Notes (Signed)
 Please attest and cosign this visit due to patients primary care provider not being in the office at the time the visit was completed.    Subjective:   [name] is a 89 y.o. who presents for a Medicare Wellness preventive visit.  As a reminder, Annual Wellness Visits don't include a physical exam, and some assessments may be limited, especially if this visit is performed virtually. We may recommend an in-person follow-up visit with your provider if needed.  Visit Complete: Virtual I connected with  [name] on 12/08/23 by a audio enabled telemedicine application and verified that I am speaking with the correct person using two identifiers.  Patient Location: Home  Provider Location: Home Office  I discussed the limitations of evaluation and management by telemedicine. The patient expressed understanding and agreed to proceed.  Vital Signs: Because this visit was a virtual/telehealth visit, some criteria may be missing or patient reported. Any vitals not documented were not able to be obtained and vitals that have been documented are patient reported.  VideoDeclined- This patient declined Librarian, academic. Therefore the visit was completed with audio only.  Persons Participating in Visit: Patient.  AWV Questionnaire: No: Patient Medicare AWV questionnaire was not completed prior to this visit.  Cardiac Risk Factors include: advanced age (>38men, >110 women)     Objective:    Today's Vitals   12/08/23 1129  Weight: 195 lb (88.5 kg)  Height: 5' 5.5 (1.664 m)   Body mass index is 31.96 kg/m.     12/08/2023   11:39 AM 08/31/2023   12:40 AM 02/03/2023    1:22 PM 11/30/2022   11:27 AM 04/14/2022    9:43 AM 03/11/2022    6:35 AM 11/27/2021   11:07 AM  Advanced Directives  Does Patient Have a Medical Advance Directive? No No Yes No No No No  Type of Advance Directive   Healthcare Power of Attorney      Would patient like information on  creating a medical advance directive?    No - Patient declined No - Patient declined No - Patient declined No - Patient declined    Current Medications (verified) Outpatient Encounter Medications as of 12/08/2023  Medication Sig   acetaminophen  (TYLENOL ) 500 MG tablet Take 500 mg by mouth as needed for mild pain.   albuterol  (VENTOLIN  HFA) 108 (90 Base) MCG/ACT inhaler Inhale 1-2 puffs into the lungs every 6 (six) hours as needed for wheezing or shortness of breath.   allopurinol  (ZYLOPRIM ) 100 MG tablet TAKE 1 TABLET BY MOUTH EVERY DAY   aspirin  EC 81 MG tablet Take 1 tablet (81 mg total) by mouth daily. Swallow whole.   colchicine  0.6 MG tablet Take 1 tab twice a day for 1 day if needed.  Then 1 a day if needed thereafter.  For gout.   cyanocobalamin  (,VITAMIN B-12,) 1000 MCG/ML injection 1000mcg IM every 28 days   diltiazem  (CARDIZEM  CD) 180 MG 24 hr capsule Take 1 capsule (180 mg total) by mouth daily.   furosemide  (LASIX ) 20 MG tablet TAKE 1 TABLET BY MOUTH EVERY DAY   lisinopril  (ZESTRIL ) 10 MG tablet Off med as of 09/28/23   metoprolol  tartrate (LOPRESSOR ) 25 MG tablet TAKE 0.5 TABLETS BY MOUTH 2 TIMES DAILY.   omeprazole  (PRILOSEC ) 40 MG capsule Take 1 capsule (40 mg total) by mouth daily.   tamsulosin  (FLOMAX ) 0.4 MG CAPS capsule Take 1 capsule (0.4 mg total) by mouth daily.   warfarin (COUMADIN )  4 MG tablet TAKE 1 TO 1 AND 1/2 TABLETS BY MOUTH DAILY OR AS DIRECTED BY COUMADIN  CLINIC   predniSONE  (DELTASONE ) 10 MG tablet Take 10 mg by mouth daily. Use as needed for gout. (Patient not taking: Reported on 12/08/2023)   No facility-administered encounter medications on file as of 12/08/2023.    Allergies (verified) Penicillins, Atorvastatin , Pravastatin , Atrovent  nasal spray [ipratropium], Hydrocodone , Nsaids, Uloric  [febuxostat ], Vancomycin , Clarithromycin, and Doxycycline    History: Past Medical History:  Diagnosis Date   Arthritis    Bradycardia    a. nocturnal with pauses  overnight on tele likely due to OSA 10/2015.   CKD (chronic kidney disease), stage III (HCC)    Coronary artery disease    a. prev nonobst. b. LHC 10/24/15: occlusion of small diffusely diseased OM2 s/p balloon angioplasty, mild nonobstructive disease of mLAD and mRCA, normal LVEDP   Diabetes mellitus without complication (HCC)    01/02/21 Pt's daughter states patient does not  have diabetes.  A1C 07/23/20 was 7.2, patientr had been on medication.   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 Wonda & Dr. Maryjane   Severe aortic stenosis    Vertigo    Past Surgical History:  Procedure Laterality Date   AMPUTATION Right 03/03/2019   Procedure: RIGHT 2ND TOE AMPUTATION;  Surgeon: Harden Jerona GAILS, MD;  Location: [facility] OR;  Service: Orthopedics;  Laterality: Right;   AMPUTATION Right 01/03/2021   Procedure: RIGHT 3RD TOE AMPUTATION;  Surgeon: Harden Jerona GAILS, MD;  Location: [facility] OR;  Service: Orthopedics;  Laterality: Right;   BALLOON DILATION N/A 10/22/2014   Procedure: BALLOON DILATION;  Surgeon: Lupita FORBES Commander, MD;  Location: [facility] ENDOSCOPY;  Service: Endoscopy;  Laterality: N/A;   CARDIAC CATHETERIZATION  07/2008   CARDIAC CATHETERIZATION N/A 10/24/2015   Procedure: Left Heart Cath and Coronary Angiography;  Surgeon: Lonni Hanson, MD;  Location: [facility] INVASIVE CV LAB;  Service: Cardiovascular;  Laterality: N/A;    CARDIAC CATHETERIZATION N/A 10/24/2015   Procedure: Coronary Balloon Angioplasty;  Surgeon: Lonni Hanson, MD;  Location: [facility] INVASIVE CV LAB;  Service: Cardiovascular;  Laterality: N/A;   Carotid Dopplers  09/2009   no sig extracranial stenosis and vertebral arteries had antegrade flow   CATARACT EXTRACTION W/ INTRAOCULAR LENS  IMPLANT, BILATERAL Bilateral    CORONARY STENT INTERVENTION N/A 03/11/2022   Procedure: CORONARY STENT INTERVENTION;  Surgeon: Wonda Sharper, MD;  Location: [facility] INVASIVE CV LAB;  Service: Cardiovascular;  Laterality: N/A;   ESOPHAGOGASTRODUODENOSCOPY (EGD) WITH ESOPHAGEAL DILATION     I've had it stretched 3 times (10/23/2015)   ESOPHAGOGASTRODUODENOSCOPY (EGD) WITH ESOPHAGEAL DILATION  06/2011   /medical hx above  (09/09/2017)   ESOPHAGOGASTRODUODENOSCOPY (EGD) WITH PROPOFOL  N/A 10/22/2014   Procedure: ESOPHAGOGASTRODUODENOSCOPY (EGD) WITH PROPOFOL ;  Surgeon: Lupita FORBES Commander, MD;  Location: [facility]OSCOPY;  Service: Endoscopy;  Laterality: N/A;   FINGER SURGERY Right 02/1964   cut little finger off; had it reattached   I & D EXTREMITY Right 09/10/2017   Procedure: RIGHT FOOT DEBRIDEMENT;  Surgeon: Harden Jerona GAILS, MD;  Location: [facility] OR;  Service: Orthopedics;  Laterality: Right;   INTRAOPERATIVE TRANSTHORACIC ECHOCARDIOGRAM N/A 04/14/2022   Procedure: INTRAOPERATIVE TRANSTHORACIC ECHOCARDIOGRAM;  Surgeon: Wonda Sharper, MD;  Location: [facility] INVASIVE CV LAB;  Service: Open Heart Surgery;  Laterality: N/A;   MRI of brain  09/2009   chronic microvascular ischemia   OSA Sleep Study     per Dr. Chalice   RIGHT/LEFT HEART CATH AND CORONARY ANGIOGRAPHY N/A 03/11/2022   Procedure: RIGHT/LEFT HEART CATH AND CORONARY ANGIOGRAPHY;  Surgeon: Wonda Sharper, MD;  Location: [facility] INVASIVE CV LAB;  Service: Cardiovascular;  Laterality: N/A;   SHOULDER ARTHROSCOPY W/ ROTATOR CUFF REPAIR Right 2008   TRANSCATHETER AORTIC VALVE REPLACEMENT, TRANSFEMORAL Left 04/14/2022   Procedure:  Transcatheter Aortic Valve Replacement, Transfemoral;  Surgeon: Wonda Sharper, MD;  Location: [facility] INVASIVE CV LAB;  Service: Open Heart Surgery;  Laterality: Left;   Family History  Problem Relation Age of Onset   Stomach cancer Mother        died in 21's    Hypertension Father        died in his 54's pna likely dementia   Dementia Father    Pneumonia Father    Heart disease Brother        S/P CABG   Sleep apnea Brother    Sleep apnea Daughter    Hypertension Other    Prostate cancer Neg Hx    Colon cancer Neg Hx    Social History   Socioeconomic History   Marital status: Widowed    Spouse name: Not on file   Number of children: 3   Years of education: Not on file   Highest education level: 12th grade  Occupational History   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  Tobacco Use   Smoking status: Former    Current packs/day: 0.00    Average packs/day: 0.1 packs/day for 4.0 years (0.5 ttl pk-yrs)    Types: Cigarettes    Start date: 03/16/1965    Quit date: 03/16/1969    Years since quitting: 54.7    Passive exposure: Never   Smokeless tobacco: Former   Tobacco comments:    chewed when I smoked; smoked 1 pack/week for about 4 years, quit 1971  Vaping Use   Vaping status: Never Used  Substance and Sexual Activity   Alcohol use: Not Currently    Alcohol/week: 0.0 standard drinks of alcohol    Comment: 09/09/2017  Quit all alcohol in 1970's   Drug use: Never   Sexual activity: Not on file  Other Topics Concern   Not on file  Social History Narrative   Lives in [place], married 1953, widowed after 62 years   Retired Naval architect, still does Aeronautical engineer.   He is not routinely exercising.   Daily caffeine    Social Drivers of Corporate investment banker Strain: Low Risk  (12/08/2023)   Overall Financial Resource Strain (CARDIA)    Difficulty of Paying Living Expenses: Not hard at all  Food Insecurity: No Food Insecurity (12/08/2023)    Hunger Vital Sign    Worried About Running Out of Food in the Last Year: Never true    Ran Out of Food in the Last  Year: Never true  Transportation Needs: No Transportation Needs (12/08/2023)   PRAPARE - Administrator, Civil Service (Medical): No    Lack of Transportation (Non-Medical): No  Physical Activity: Inactive (12/08/2023)   Exercise Vital Sign    Days of Exercise per Week: 0 days    Minutes of Exercise per Session: 0 min  Stress: No Stress Concern Present (12/08/2023)   [employer] of Occupational Health - Occupational Stress Questionnaire    Feeling of Stress: Not at all  Social Connections: Moderately Isolated (12/08/2023)   Social Connection and Isolation Panel    Frequency of Communication with Friends and Family: Twice a week    Frequency of Social Gatherings with Friends and Family: Once a week    Attends Religious Services: Never    Database administrator or Organizations: Yes    Attends Banker Meetings: Never    Marital Status: Widowed    Tobacco Counseling Counseling given: Not Answered Tobacco comments: chewed when I smoked; smoked 1 pack/week for about 4 years, quit 1971    Clinical Intake:  Pre-visit preparation completed: Yes  Pain : No/denies pain    BMI - recorded: 31.96 Nutritional Status: BMI > 30  Obese Nutritional Risks: None Diabetes: Yes CBG done?: No Did pt. bring in CBG monitor from home?: No  Lab Results  Component Value Date   HGBA1C 6.8 (H) 09/28/2023   HGBA1C 7.1 (H) 02/08/2023   HGBA1C 6.6 (H) 09/10/2022     How often do you need to have someone help you when you read instructions, pamphlets, or other written materials from your doctor or pharmacy?: 1 - Never  Interpreter Needed?: No  Comments: lives alone Information entered by :: B.Alonni Heimsoth,LPN   Activities of Daily Living     12/08/2023   11:40 AM  In your present state of health, do you have any difficulty performing the following  activities:  Hearing? 0  Vision? 0  Difficulty concentrating or making decisions? 0  Walking or climbing stairs? 0  Dressing or bathing? 0  Doing errands, shopping? 0  Preparing Food and eating ? N  Using the Toilet? N  In the past six months, have you accidently leaked urine? N  Do you have problems with loss of bowel control? N  Managing your Medications? N  Managing your Finances? N  Housekeeping or managing your Housekeeping? N    Patient Care Team: [name], MD as PCP - General (Family Medicine) Wonda Sharper, MD as PCP - Cardiology (Cardiology) Wonda Sharper, MD (Cardiology) Jane Charleston, MD as Consulting Physician (Orthopedic Surgery) Dennise Hoes, MD as Consulting Physician (Nephrology) Geronimo Manuelita SAUNDERS, [facility] (Pharmacist) Cleotilde Elspeth CROME, OD (Optometry)  I have updated your Care Teams any recent Medical Services you may have received from other providers in the past year.     Assessment:   This is a routine wellness examination for [name].  Hearing/Vision screen Hearing Screening - Comments:: Patient denies any hearing difficulties.   Vision Screening - Comments:: Pt says their vision is good with glasses [employer]   Goals Addressed             This Visit's Progress    COMPLETED: DIET - EAT MORE FRUITS AND VEGETABLES       COMPLETED: Increase physical activity       Starting 02/13/2016, I will continue to exercise at least 30 min daily.      Patient Stated   On track  12/08/23-, I will maintain and continue medications as prescribed.      Patient Stated   On track    12/08/23-Maintain Current health status       Depression Screen     12/08/2023   11:36 AM 09/28/2023   12:51 PM 11/30/2022   11:22 AM 09/10/2022    9:56 AM 11/27/2021   11:06 AM 11/23/2020   11:05 AM 05/16/2019    2:45 PM  PHQ 2/9 Scores  PHQ - 2 Score 0 0 0 0 0 0 0  PHQ- 9 Score  0   0  0    Fall Risk     12/08/2023   11:32 AM 09/28/2023   12:51 PM  11/30/2022   11:25 AM 09/10/2022    9:45 AM 11/27/2021   11:08 AM  Fall Risk   Falls in the past year? 0 0 0 0 0  Number falls in past yr: 0 0 0 0 0  Injury with Fall? 0 0 0 0 0  Risk for fall due to : Impaired balance/gait No Fall Risks Impaired balance/gait Impaired mobility No Fall Risks  Follow up Education provided;Falls prevention discussed Falls evaluation completed Falls prevention discussed;Falls evaluation completed;Education provided Falls evaluation completed Falls prevention discussed;Falls evaluation completed      Data saved with a previous flowsheet row definition    MEDICARE RISK AT HOME:  Medicare Risk at Home Any stairs in or around the home?: Yes If so, are there any without handrails?: Yes Home free of loose throw rugs in walkways, pet beds, electrical cords, etc?: Yes Adequate lighting in your home to reduce risk of falls?: Yes Life alert?: No Use of a cane, walker or w/c?: Yes Grab bars in the bathroom?: Yes Shower chair or bench in shower?: Yes (does not use but has chair) Elevated toilet seat or a handicapped toilet?: Yes  TIMED UP AND GO:  Was the test performed?  No  Cognitive Function: 6CIT completed    05/16/2019    2:47 PM 02/13/2016    2:10 PM  MMSE - Mini Mental State Exam  Orientation to time 5 5   Orientation to Place 5 5   Registration 3 3   Attention/ Calculation 5 0   Recall 2 3   Language- name 2 objects  0   Language- repeat 1 1  Language- follow 3 step command  3   Language- read & follow direction  0   Write a sentence  0   Copy design  0   Total score  20      Data saved with a previous flowsheet row definition        12/08/2023   11:49 AM 11/30/2022   11:29 AM 11/27/2021   11:10 AM  6CIT Screen  What Year? 0 points 0 points 0 points  What month? 0 points 3 points 0 points  What time? 0 points 0 points 0 points  Count back from 20 0 points 2 points 0 points  Months in reverse 0 points 0 points 0 points  Repeat phrase 2  points 4 points 2 points  Total Score 2 points 9 points 2 points    Immunizations Immunization History  Administered Date(s) Administered   INFLUENZA, HIGH DOSE SEASONAL PF 12/15/2016, 12/19/2018   Influenza, Seasonal, Injecte, Preservative Fre 12/14/2013   Influenza,inj,Quad PF,6+ Mos 12/17/2014, 01/29/2018   Influenza-Unspecified 12/15/2016, 12/19/2018, 01/11/2021   Moderna Sars-Covid-2 Vaccination 04/11/2019, 05/09/2019, 01/15/2020, 08/09/2020   Pneumococcal Conjugate-13 12/17/2014  Pneumococcal Polysaccharide-23 09/19/2009   Td 09/19/2009   Zoster, Live 09/19/2009    Screening Tests Health Maintenance  Topic Date Due   Zoster Vaccines- Shingrix (1 of 2) 12/30/1949   DTaP/Tdap/Td (2 - Tdap) 09/20/2019   Influenza Vaccine  10/15/2023   HEMOGLOBIN A1C  03/30/2024   FOOT EXAM  09/27/2024   OPHTHALMOLOGY EXAM  09/28/2024   Medicare Annual Wellness (AWV)  12/07/2024   Pneumococcal Vaccine: 50+ Years  Completed   HPV VACCINES  Aged Out   Meningococcal B Vaccine  Aged Out   COVID-19 Vaccine  Discontinued    Health Maintenance Items Addressed: None due at this time. Pt will receive vaccines at their pharmcy when decided to obtain   Additional Screening:  Vision Screening: Recommended annual ophthalmology exams for early detection of glaucoma and other disorders of the eye. Is the patient up to date with their annual eye exam?  No  Who is the provider or what is the name of the office in which the patient attends annual eye exams? Dr Cleotilde  Dental Screening: Recommended annual dental exams for proper oral hygiene  Community Resource Referral / Chronic Care Management: CRR required this visit?  No   CCM required this visit?  Appt scheduled with PCP   Plan:    I have personally reviewed and noted the following in the patient's chart:   Medical and social history Use of alcohol, tobacco or illicit drugs  Current medications and supplements including opioid  prescriptions. Patient is not currently taking opioid prescriptions. Functional ability and status Nutritional status Physical activity Advanced directives List of other physicians Hospitalizations, surgeries, and ER visits in previous 12 months Vitals Screenings to include cognitive, depression, and falls Referrals and appointments  In addition, I have reviewed and discussed with patient certain preventive protocols, quality metrics, and best practice recommendations. A written personalized care plan for preventive services as well as general preventive health recommendations were provided to patient.   Nathan LITTIE Saris, LPN   [id number]   After Visit Summary: (MyChart) Due to this being a telephonic visit, the after visit summary with patients personalized plan was offered to patient via MyChart   Notes: Nothing significant to report at this time.

## 2023-12-08 NOTE — Patient Instructions (Signed)
 [name],  Thank you for taking the time for your Medicare Wellness Visit. I appreciate your continued commitment to your health goals. Please review the care plan we discussed, and feel free to reach out if I can assist you further.  Medicare recommends these wellness visits once per year to help you and your care team stay ahead of potential health issues. These visits are designed to focus on prevention, allowing your provider to concentrate on managing your acute and chronic conditions during your regular appointments.  Please note that Annual Wellness Visits do not include a physical exam. Some assessments may be limited, especially if the visit was conducted virtually. If needed, we may recommend a separate in-person follow-up with your provider.  Ongoing Care Seeing your primary care provider every 3 to 6 months helps us  monitor your health and provide consistent, personalized care.   Referrals If a referral was made during today's visit and you haven't received any updates within two weeks, please contact the referred provider directly to check on the status.  Recommended Screenings:  Health Maintenance  Topic Date Due   Zoster (Shingles) Vaccine (1 of 2) 12/30/1949   DTaP/Tdap/Td vaccine (2 - Tdap) 09/20/2019   Eye exam for diabetics  11/04/2022   Flu Shot  10/15/2023   Hemoglobin A1C  03/30/2024   Complete foot exam   09/27/2024   Medicare Annual Wellness Visit  12/07/2024   Pneumococcal Vaccine for age over 42  Completed   HPV Vaccine  Aged Out   Meningitis B Vaccine  Aged Out   COVID-19 Vaccine  Discontinued       08/31/2023   12:40 AM  Advanced Directives  Does Patient Have a Medical Advance Directive? No   Advance Care Planning is important because it: Ensures you receive medical care that aligns with your values, goals, and preferences. Provides guidance to your family and loved ones, reducing the emotional burden of decision-making during critical  moments.  Vision: Annual vision screenings are recommended for early detection of glaucoma, cataracts, and diabetic retinopathy. These exams can also reveal signs of chronic conditions such as diabetes and high blood pressure.  Dental: Annual dental screenings help detect early signs of oral cancer, gum disease, and other conditions linked to overall health, including heart disease and diabetes.  Please see the attached documents for additional preventive care recommendations.

## 2023-12-19 ENCOUNTER — Other Ambulatory Visit: Payer: Self-pay | Admitting: Family Medicine

## 2023-12-19 DIAGNOSIS — M109 Gout, unspecified: Secondary | ICD-10-CM

## 2023-12-23 ENCOUNTER — Other Ambulatory Visit: Payer: Self-pay | Admitting: Family Medicine

## 2023-12-29 ENCOUNTER — Encounter: Payer: Self-pay | Admitting: Adult Health

## 2023-12-29 ENCOUNTER — Ambulatory Visit: Payer: Medicare Other | Admitting: Adult Health

## 2023-12-29 VITALS — BP 134/85 | HR 79 | Ht 66.0 in | Wt 197.0 lb

## 2023-12-29 DIAGNOSIS — G4733 Obstructive sleep apnea (adult) (pediatric): Secondary | ICD-10-CM | POA: Diagnosis not present

## 2023-12-29 NOTE — Progress Notes (Signed)
 SABRA

## 2023-12-29 NOTE — Progress Notes (Signed)
 PATIENT: [name] DOB: 1930/06/25  REASON FOR VISIT: follow up HISTORY FROM: patient PRIMARY NEUROLOGIST: Dr. Chalice  Chief Complaint  Patient presents with   RM 4    Patient is here with daughter for Cpap follow-up - no issues with machine  ESS - 1     HISTORY OF PRESENT ILLNESS: Today 12/29/23:  [name] is a 89 y.o. male with a history of obstructive sleep apnea on BiPAP. Returns today for follow-up.  He reports that the BiPAP machine is working well for him.  Currently has the full face mask.  Denies any new issues.  Download is below        12/29/22: [name] is a 89 y.o. male with a history of OSA on Bipap. Returns today for follow-up.  He reports that the CPAP is working well for him.  He denies any new issues.  His download is below     12/18/21: [name] is an 89 year old male with a history of obstructive sleep apnea on BiPAP.  He returns today for follow-up.  His download is below.  He denies any new issues.  Reports that the BiPAP works well for him.  He does not like to sleep without it.  Returns today for an evaluation.      REVIEW OF SYSTEMS: Out of a complete 14 system review of symptoms, the patient complains only of the following symptoms, and all other reviewed systems are negative.    ESS 3  ALLERGIES: Allergies  Allergen Reactions   Penicillins Shortness Of Breath and Swelling    Patient tolerated Rocephin  injection 2019  PATIENT HAS HAD A PCN REACTION WITH IMMEDIATE RASH, FACIAL/TONGUE/THROAT SWELLING, SOB, OR LIGHTHEADEDNESS WITH HYPOTENSION:  #  #  YES  #  Has patient had a PCN reaction causing severe rash involving mucus membranes or skin necrosis: NO Has patient had a PCN reaction that required hospitalization NO Has patient had a PCN reaction occurring within the last 10 years: NO If all of the above answers are NO, then may proceed with Cephalosporin use.    Atorvastatin  Other (See Comments)    No energy,  just felt bad     Pravastatin  Other (See Comments)    myalgias   Atrovent  Nasal Spray [Ipratropium] Other (See Comments)    Dizzy with use   Hydrocodone  Itching and Other (See Comments)    swimmy headed after use   Nsaids Other (See Comments)    Due to renal function   Uloric  [Febuxostat ] Other (See Comments)    Would avoid, worsening gout sx with use.    Vancomycin  Other (See Comments)    Red man syndrome- tolerated with slow run and benadryl  use   Clarithromycin Diarrhea   Doxycycline  Other (See Comments)    GI upset     HOME MEDICATIONS: Outpatient Medications Prior to Visit  Medication Sig Dispense Refill   acetaminophen  (TYLENOL ) 500 MG tablet Take 500 mg by mouth as needed for mild pain.     albuterol  (VENTOLIN  HFA) 108 (90 Base) MCG/ACT inhaler Inhale 1-2 puffs into the lungs every 6 (six) hours as needed for wheezing or shortness of breath. 8 g 0   allopurinol  (ZYLOPRIM ) 100 MG tablet TAKE 1 TABLET BY MOUTH ONCE DAILY 90 tablet 2   aspirin  EC 81 MG tablet Take 1 tablet (81 mg total) by mouth daily. Swallow whole. 90 tablet 3   colchicine  0.6 MG tablet TAKE 1 TABLET BY MOUTH TWICE A  DAY IF NEEDED THEN 1 DAILY IF NEEDED THEREAFTER FOR GOUT 180 tablet 0   cyanocobalamin  (,VITAMIN B-12,) 1000 MCG/ML injection 1000mcg IM every 28 days 1 mL    diltiazem  (CARDIZEM  CD) 180 MG 24 hr capsule Take 1 capsule (180 mg total) by mouth daily. 90 capsule 1   furosemide  (LASIX ) 20 MG tablet TAKE 1 TABLET BY MOUTH EVERY DAY 90 tablet 3   lisinopril  (ZESTRIL ) 10 MG tablet Off med as of 09/28/23     metoprolol  tartrate (LOPRESSOR ) 25 MG tablet TAKE 0.5 TABLETS BY MOUTH 2 TIMES DAILY. 90 tablet 3   omeprazole  (PRILOSEC ) 40 MG capsule Take 1 capsule (40 mg total) by mouth daily. 90 capsule 3   tamsulosin  (FLOMAX ) 0.4 MG CAPS capsule Take 1 capsule (0.4 mg total) by mouth daily. 90 capsule 3   warfarin (COUMADIN ) 4 MG tablet TAKE 1 TO 1 AND 1/2 TABLETS BY MOUTH DAILY OR AS DIRECTED BY COUMADIN   CLINIC 100 tablet 1   predniSONE  (DELTASONE ) 10 MG tablet Take 10 mg by mouth daily. Use as needed for gout. (Patient not taking: Reported on 12/29/2023)     No facility-administered medications prior to visit.    PAST MEDICAL HISTORY: Past Medical History:  Diagnosis Date   Arthritis    Bradycardia    a. nocturnal with pauses overnight on tele likely due to OSA 10/2015.   CKD (chronic kidney disease), stage III (HCC)    Coronary artery disease    a. prev nonobst. b. LHC 10/24/15: occlusion of small diffusely diseased OM2 s/p balloon angioplasty, mild nonobstructive disease of mLAD and mRCA, normal LVEDP   Diabetes mellitus without complication (HCC)    01/02/21 Pt's daughter states patient does not  have diabetes.  A1C 07/23/20 was 7.2, patientr had been on medication.   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 Wonda & Dr. Maryjane   Severe aortic stenosis    Vertigo     PAST SURGICAL HISTORY: Past Surgical History:  Procedure Laterality Date   AMPUTATION Right 03/03/2019   Procedure: RIGHT 2ND TOE AMPUTATION;  Surgeon: Harden Jerona GAILS, MD;  Location: [facility] OR;  Service: Orthopedics;  Laterality: Right;   AMPUTATION Right 01/03/2021   Procedure: RIGHT 3RD TOE AMPUTATION;  Surgeon: Harden Jerona GAILS, MD;  Location: [facility] OR;  Service: Orthopedics;  Laterality: Right;   BALLOON DILATION N/A 10/22/2014    Procedure: BALLOON DILATION;  Surgeon: Lupita FORBES Commander, MD;  Location: [facility]COPY;  Service: Endoscopy;  Laterality: N/A;   CARDIAC CATHETERIZATION  07/2008   CARDIAC CATHETERIZATION N/A 10/24/2015   Procedure: Left Heart Cath and Coronary Angiography;  Surgeon: Lonni Hanson, MD;  Location: [facility] INVASIVE CV LAB;  Service: Cardiovascular;  Laterality: N/A;   CARDIAC CATHETERIZATION N/A 10/24/2015   Procedure: Coronary  Balloon Angioplasty;  Surgeon: Lonni Hanson, MD;  Location: [facility] INVASIVE CV LAB;  Service: Cardiovascular;  Laterality: N/A;   Carotid Dopplers  09/2009   no sig extracranial stenosis and vertebral arteries had antegrade flow   CATARACT EXTRACTION W/ INTRAOCULAR LENS  IMPLANT, BILATERAL Bilateral    CORONARY STENT INTERVENTION N/A 03/11/2022   Procedure: CORONARY STENT INTERVENTION;  Surgeon: Wonda Sharper, MD;  Location: [facility] INVASIVE CV LAB;  Service: Cardiovascular;  Laterality: N/A;   ESOPHAGOGASTRODUODENOSCOPY (EGD) WITH ESOPHAGEAL DILATION     I've had it stretched 3 times (10/23/2015)   ESOPHAGOGASTRODUODENOSCOPY (EGD) WITH ESOPHAGEAL DILATION  06/2011   /medical hx above  (09/09/2017)   ESOPHAGOGASTRODUODENOSCOPY (EGD) WITH PROPOFOL  N/A 10/22/2014   Procedure: ESOPHAGOGASTRODUODENOSCOPY (EGD) WITH PROPOFOL ;  Surgeon: Lupita FORBES Commander, MD;  Location: [facility];  Service: Endoscopy;  Laterality: N/A;   FINGER SURGERY Right 02/1964   cut little finger off; had it reattached   I & D EXTREMITY Right 09/10/2017   Procedure: RIGHT FOOT DEBRIDEMENT;  Surgeon: Harden Jerona GAILS, MD;  Location: [facility] OR;  Service: Orthopedics;  Laterality: Right;   INTRAOPERATIVE TRANSTHORACIC ECHOCARDIOGRAM N/A 04/14/2022   Procedure: INTRAOPERATIVE TRANSTHORACIC ECHOCARDIOGRAM;  Surgeon: Wonda Sharper, MD;  Location: [facility] INVASIVE CV LAB;  Service: Open Heart Surgery;  Laterality: N/A;   MRI of brain  09/2009   chronic microvascular ischemia   OSA Sleep Study     per Dr. Chalice   RIGHT/LEFT HEART  CATH AND CORONARY ANGIOGRAPHY N/A 03/11/2022   Procedure: RIGHT/LEFT HEART CATH AND CORONARY ANGIOGRAPHY;  Surgeon: Wonda Sharper, MD;  Location: [facility] INVASIVE CV LAB;  Service: Cardiovascular;  Laterality: N/A;   SHOULDER ARTHROSCOPY W/ ROTATOR CUFF REPAIR Right 2008   TRANSCATHETER AORTIC VALVE REPLACEMENT, TRANSFEMORAL Left 04/14/2022   Procedure: Transcatheter Aortic Valve Replacement, Transfemoral;  Surgeon: Wonda Sharper, MD;  Location: [facility] INVASIVE CV LAB;  Service: Open Heart Surgery;  Laterality: Left;    FAMILY HISTORY: Family History  Problem Relation Age of Onset   Stomach cancer Mother        died in 63's    Hypertension Father        died in his 72's pna likely dementia   Dementia Father    Pneumonia Father    Heart disease Brother        S/P CABG   Sleep apnea Brother    Sleep apnea Daughter    Hypertension Other    Prostate cancer Neg Hx    Colon cancer Neg Hx     SOCIAL HISTORY: Social History   Socioeconomic History   Marital status: Widowed    Spouse name: Not on file   Number of children: 3   Years of education: Not on file   Highest education level: 12th grade  Occupational History   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  Tobacco Use   Smoking status: Former    Current packs/day: 0.00    Average packs/day: 0.1 packs/day for 4.0 years (0.5 ttl pk-yrs)    Types: Cigarettes    Start date: 03/16/1965    Quit date: 03/16/1969    Years since quitting: 54.8    Passive exposure: Never   Smokeless tobacco: Former   Tobacco comments:    chewed when I smoked; smoked 1 pack/week for about 4 years, quit 1971  Vaping Use   Vaping status: Never Used  Substance and Sexual Activity   Alcohol use: Not Currently    Alcohol/week: 0.0 standard drinks  of alcohol    Comment: 09/09/2017  Quit all alcohol in 1970's   Drug use: Never   Sexual activity: Not on file  Other Topics Concern   Not on file  Social History Narrative   Lives in  [place], married 1953, widowed after 62 years   Retired Naval architect, still does Aeronautical engineer.   He is not routinely exercising.   1-2 cups caffeine daily    Social Drivers of Health   Financial Resource Strain: Low Risk  (12/08/2023)   Overall Financial Resource Strain (CARDIA)    Difficulty of Paying Living Expenses: Not hard at all  Food Insecurity: No Food Insecurity (12/08/2023)   Hunger Vital Sign    Worried About Running Out of Food in the Last Year: Never true    Ran Out of Food in the Last Year: Never true  Transportation Needs: No Transportation Needs (12/08/2023)   PRAPARE - Administrator, Civil Service (Medical): No    Lack of Transportation (Non-Medical): No  Physical Activity: Inactive (12/08/2023)   Exercise Vital Sign    Days of Exercise per Week: 0 days    Minutes of Exercise per Session: 0 min  Stress: No Stress Concern Present (12/08/2023)   [employer] of Occupational Health - Occupational Stress Questionnaire    Feeling of Stress: Not at all  Social Connections: Moderately Isolated (12/08/2023)   Social Connection and Isolation Panel    Frequency of Communication with Friends and Family: Twice a week    Frequency of Social Gatherings with Friends and Family: Once a week    Attends Religious Services: Never    Database administrator or Organizations: Yes    Attends Banker Meetings: Never    Marital Status: Widowed  Intimate Partner Violence: Not At Risk (12/08/2023)   Humiliation, Afraid, Rape, and Kick questionnaire    Fear of Current or Ex-Partner: No    Emotionally Abused: No    Physically Abused: No    Sexually Abused: No      PHYSICAL EXAM  Vitals:   12/29/23 1025  BP: 134/85  Pulse: 79  SpO2: 98%  Weight: 197 lb (89.4 kg)  Height: 5' 6 (1.676 m)    Body mass index is 31.8 kg/m.  Generalized: Well developed, in no acute distress  Chest: Lungs clear to auscultation bilaterally  Neurological examination   Mentation: Alert oriented to time, place, history taking. Follows all commands speech and language fluent Gait and station: Gait is normal.    DIAGNOSTIC DATA (LABS, IMAGING, TESTING) - I reviewed patient records, labs, notes, testing and imaging myself where available.  Lab Results  Component Value Date   WBC 6.8 11/05/2023   HGB 14.3 11/05/2023   HCT 43.9 11/05/2023   MCV 87.5 11/05/2023   PLT 100 (L) 11/05/2023      Component Value Date/Time   NA 139 09/28/2023 1343   NA 138 07/21/2022 1120   K 4.2 09/28/2023 1343   CL 98 09/28/2023 1343   CO2 31 09/28/2023 1343   GLUCOSE 73 09/28/2023 1343   BUN 21 09/28/2023 1343   BUN 23 07/21/2022 1120   CREATININE 1.42 09/28/2023 1343   CREATININE 1.15 05/16/2021 1544   CALCIUM  9.0 09/28/2023 1343   PROT 7.3 09/28/2023 1343   PROT 7.1 09/07/2019 1020   ALBUMIN 4.2 09/28/2023 1343   AST 17 09/28/2023 1343   ALT 11 09/28/2023 1343   ALKPHOS 117 09/28/2023 1343   BILITOT 0.6 09/28/2023 1343  GFRNONAA 42 (L) 02/03/2023 1540   GFRAA 55 (L) 03/03/2019 0643   Lab Results  Component Value Date   CHOL 187 09/28/2023   HDL 26.00 (L) 09/28/2023   LDLCALC 120 (H) 09/28/2023   LDLDIRECT 95.0 05/16/2019   TRIG 205.0 (H) 09/28/2023   CHOLHDL 7 09/28/2023   Lab Results  Component Value Date   HGBA1C 6.8 (H) 09/28/2023   Lab Results  Component Value Date   VITAMINB12 304 09/28/2023   Lab Results  Component Value Date   TSH 2.33 05/16/2019      ASSESSMENT AND PLAN 89 y.o. year old male  has a past medical history of Arthritis, Bradycardia, CKD (chronic kidney disease), stage III (HCC), Coronary artery disease, Diabetes mellitus without complication (HCC), Diabetic peripheral neuropathy (HCC) (10/04/2019), Diverticulosis, Esophageal reflux, Esophageal stricture from GERD (06/08/2011), Gastric ulcer, Gout, unspecified, History of blood transfusion (02/1964), Hyperlipidemia, Hypertension, Obesity, unspecified, OSA on CPAP,  Paroxysmal nocturnal dyspnea, Persistent atrial fibrillation (HCC), Pneumonia, S/P TAVR (transcatheter aortic valve replacement) (04/14/2022), Severe aortic stenosis, and Vertigo. here with:  OSA on BiPAP  - BIPAP compliance excellent - Good treatment of AHI  - Encourage patient to use BIPAP nightly and > 4 hours each night - F/U in 1 year or sooner if needed   Duwaine Russell, MSN, NP-C 12/29/2023, 10:33 AM [facility] [address], [address] [place], [place] [address] [phone]  The patient's condition requires frequent monitoring and adjustments in the treatment plan, reflecting the ongoing complexity of care.  This provider is the continuing focal point for all needed services for this condition.

## 2023-12-29 NOTE — Patient Instructions (Addendum)
Continue using BiPAP nightly and greater than 4 hours each night If your symptoms worsen or you develop new symptoms please let us know.    

## 2023-12-30 ENCOUNTER — Ambulatory Visit: Attending: Student in an Organized Health Care Education/Training Program

## 2023-12-30 DIAGNOSIS — Z5181 Encounter for therapeutic drug level monitoring: Secondary | ICD-10-CM | POA: Diagnosis not present

## 2023-12-30 DIAGNOSIS — Z952 Presence of prosthetic heart valve: Secondary | ICD-10-CM | POA: Insufficient documentation

## 2023-12-30 DIAGNOSIS — I482 Chronic atrial fibrillation, unspecified: Secondary | ICD-10-CM | POA: Diagnosis not present

## 2023-12-30 LAB — POCT INR: INR: 1.7 — AB (ref 2.0–3.0)

## 2023-12-30 NOTE — Patient Instructions (Signed)
 Take 2 tablets today then continue taking warfarin 1 tablet daily except for 1.5 tablets on Sundays and Thursdays.  Recheck INR in 5 weeks. Call if any new medications or changes [phone].  IF YOU NEED TO HOLD YOUR WARFARIN FOR ANY PROCEDURE PLEASE FAX CLEARANCE FORM TO [phone]

## 2023-12-30 NOTE — Progress Notes (Signed)
 INR 1.7 Please see anticoagulation encounter Take 2 tablets today then continue taking warfarin 1 tablet daily except for 1.5 tablets on Sundays and Thursdays.  Recheck INR in 5 weeks. Call if any new medications or changes [phone].  IF YOU NEED TO HOLD YOUR WARFARIN FOR ANY PROCEDURE PLEASE FAX CLEARANCE FORM TO [phone]

## 2024-01-12 ENCOUNTER — Ambulatory Visit

## 2024-01-12 DIAGNOSIS — Z23 Encounter for immunization: Secondary | ICD-10-CM | POA: Diagnosis not present

## 2024-01-12 DIAGNOSIS — E538 Deficiency of other specified B group vitamins: Secondary | ICD-10-CM | POA: Diagnosis not present

## 2024-01-12 MED ORDER — CYANOCOBALAMIN 1000 MCG/ML IJ SOLN
1000.0000 ug | Freq: Once | INTRAMUSCULAR | Status: AC
  Administered 2024-01-12: 1000 ug via INTRAMUSCULAR

## 2024-01-12 NOTE — Progress Notes (Addendum)
 Per orders of [employer] DPN AGNP-C, injection of B-12 given by Delores, Sarahbeth Cashin D in right deltoid. Patient tolerated injection well. Patient will make appointment for 1 month.

## 2024-01-15 ENCOUNTER — Other Ambulatory Visit: Payer: Self-pay | Admitting: Cardiovascular Disease

## 2024-01-15 DIAGNOSIS — I482 Chronic atrial fibrillation, unspecified: Secondary | ICD-10-CM

## 2024-01-18 NOTE — Telephone Encounter (Signed)
 Warfarin 4mg  refill Afib, s/p TAVR Last INR 12/30/23 Last OV 04/26/23

## 2024-01-20 ENCOUNTER — Telehealth: Payer: Self-pay | Admitting: Cardiovascular Disease

## 2024-01-20 MED ORDER — METOPROLOL TARTRATE 25 MG PO TABS: 12.5000 mg | ORAL_TABLET | Freq: Two times a day (BID) | ORAL | 0 refills | Status: DC

## 2024-01-20 NOTE — Telephone Encounter (Signed)
*  STAT* If patient is at the pharmacy, call can be transferred to refill team.   1. Which medications need to be refilled? (please list name of each medication and dose if known)   metoprolol  tartrate (LOPRESSOR ) 25 MG tablet     2. Which pharmacy/location (including street and city if local pharmacy) is medication to be sent to? [facility] [phone] - [place], [place] - [address] Phone: [phone]  Fax: [phone]      3. Do they need a 30 day or 90 day supply? 90

## 2024-01-20 NOTE — Telephone Encounter (Signed)
 Pt's medication was sent to pt's pharmacy as requested. Confirmation received.

## 2024-02-03 ENCOUNTER — Ambulatory Visit: Attending: Cardiovascular Disease | Admitting: *Deleted

## 2024-02-03 ENCOUNTER — Telehealth: Payer: Self-pay | Admitting: *Deleted

## 2024-02-03 DIAGNOSIS — I482 Chronic atrial fibrillation, unspecified: Secondary | ICD-10-CM | POA: Diagnosis not present

## 2024-02-03 DIAGNOSIS — Z5181 Encounter for therapeutic drug level monitoring: Secondary | ICD-10-CM | POA: Diagnosis not present

## 2024-02-03 LAB — POCT INR: POC INR: 1.5

## 2024-02-03 NOTE — Telephone Encounter (Signed)
 Received surgical clearance bu it was missing some information. I called their office at 11:00 am and 12:20 pm today and VM stated that they have been closed since 11/18 at 1:00 pm. I LM requesting the number of extractions, if they were simple or surgical and also the type of anesthesia that would be used.

## 2024-02-03 NOTE — Telephone Encounter (Signed)
 I s/w Dr. Morgan, DDS himself and he tells me that the procedure is EXTRACTION OF 1 TOOTH-SURGICAL  ANESTHESIA: LOCAL WITH EPI  DR. Little did say the pt does NOT have a dental appt form procedure on 02/07/24, he has appt for bite wing to be done, though this does not clearance;   Clearance only needed for the extraction to be done in the near future.   Was a great pleasure speaking with Dr. Morgan, DDS.      Pre-operative Risk Assessment    Patient Name: [name]  DOB: 06-Jan-1931 MRN: [id number]   Date of last office visit: 04/26/23 [facility], NP Date of next office visit: NONE  Request for Surgical Clearance    Procedure:  Dental Extraction - Amount of Teeth to be Pulled:  EXTRACTION OF 1 TOOTH-SURGICAL  Date of Surgery:  Clearance TBD                                Surgeon:  DR. LUPITA MORGAN, DDS  Surgeon's Group or Practice Name:   Phone number:  [phone] Fax number:  [phone]   Type of Clearance Requested:   - Medical  - Pharmacy:  Hold Aspirin  and Warfarin (Coumadin )     Type of Anesthesia:  Local  WITH EPI   Additional requests/questions:    Bonney Niels Jest   02/03/2024, 1:40 PM

## 2024-02-03 NOTE — Telephone Encounter (Addendum)
 Pt came to clinic today to have INR checked and requested a clearance form be filled out so that he can have a tooth extraction.   Informed pt that typically for a tooth extraction warfarin does not need to be held. However, pt stated that the form needs to be filled out and sent to the dentist. Pt states that he will see the Dentist on Monday 11/24.    Faxed form to the preop pool: [phone]   Pt's Dentist: Dr. Lupita Ly:  phone number [phone]  fax number [phone]

## 2024-02-03 NOTE — Progress Notes (Signed)
 Lab Results  Component Value Date   INR 1.5 02/03/2024   INR 1.7 (A) 12/30/2023   INR 2.1 12/01/2023    Description   INR 1.5: Take 2 tablets of warfarin today and tomorrow take 1.5 tablets.  Then continue taking warfarin 1 tablet daily except for 1.5 tablets on Sundays and Thursdays.  Recheck INR in 2 weeks. Call if any new medications or changes [phone].  IF YOU NEED TO HOLD YOUR WARFARIN FOR ANY PROCEDURE PLEASE FAX CLEARANCE FORM TO [phone]

## 2024-02-03 NOTE — Patient Instructions (Signed)
 Description   INR 1.5: Take 2 tablets of warfarin today and tomorrow take 1.5 tablets.  Then continue taking warfarin 1 tablet daily except for 1.5 tablets on Sundays and Thursdays.  Recheck INR in 2 weeks. Call if any new medications or changes [phone].  IF YOU NEED TO HOLD YOUR WARFARIN FOR ANY PROCEDURE PLEASE FAX CLEARANCE FORM TO [phone]

## 2024-02-08 MED ORDER — CEPHALEXIN 500 MG PO CAPS: ORAL_CAPSULE | ORAL | 2 refills | Status: AC

## 2024-02-08 NOTE — Telephone Encounter (Signed)
   Patient Name: [name]  DOB: 12-Mar-1931 MRN: [id number]  Primary Cardiologist: Ozell Fell, MD  Chart reviewed as part of pre-operative protocol coverage.   Simple dental extractions (i.e. 1-2 teeth) are considered low risk procedures per guidelines and generally do not require any specific cardiac clearance. It is also generally accepted that for simple extractions and dental cleanings, there is no need to interrupt blood thinner therapy.   SBE prophylaxis is required for the patient from a cardiac standpoint.  Cephalexin  2gm prescribed per pharmacy team.    Per office protocol, patient can hold warfarin for 3 days prior to procedure.    Patient will not need bridging with Lovenox  (enoxaparin ) around procedure.  Please resume warfarin as soon as possible postprocedure, at the discretion of the surgeon.   I will route this recommendation to the requesting party via Epic fax function and remove from pre-op pool.  Please call with questions.  Damien JAYSON Braver, NP 02/08/2024, 8:35 AM

## 2024-02-08 NOTE — Telephone Encounter (Signed)
 Patient with diagnosis of afib on warfarin for anticoagulation.    Procedure: EXTRACTION OF 1 TOOTH-SURGICAL   Date of procedure: TBD   CHA2DS2-VASc Score = 5   This indicates a 7.2% annual risk of stroke. The patient's score is based upon: CHF History: 0 HTN History: 1 Diabetes History: 1 Stroke History: 0 Vascular Disease History: 1 Age Score: 2 Gender Score: 0      Patient has not had an Afib/aflutter ablation in the last 3 months, DCCV within the last 4 weeks or a watchman implanted in the last 45 days   Patient does require pre-op antibiotics for dental procedure. Looks like he has taken Keflex  in the past (PCN allergy) Will Rx cephalexin  2gm. Please advise patient as it doesn't look like he has been taking SBE prophylaxis  Per office protocol, patient can hold warfarin for 3 days prior to procedure.    Patient will not need bridging with Lovenox  (enoxaparin ) around procedure.  **This guidance is not considered finalized until pre-operative APP has relayed final recommendations.**

## 2024-02-16 ENCOUNTER — Inpatient Hospital Stay (HOSPITAL_COMMUNITY)

## 2024-02-16 ENCOUNTER — Emergency Department (HOSPITAL_COMMUNITY)

## 2024-02-16 ENCOUNTER — Encounter (HOSPITAL_COMMUNITY): Payer: Self-pay | Admitting: Pharmacy Technician

## 2024-02-16 ENCOUNTER — Inpatient Hospital Stay (HOSPITAL_COMMUNITY)
Admission: EM | Admit: 2024-02-16 | Discharge: 2024-02-19 | DRG: 871 | Disposition: A | Attending: Internal Medicine | Admitting: Internal Medicine

## 2024-02-16 ENCOUNTER — Other Ambulatory Visit: Payer: Self-pay

## 2024-02-16 DIAGNOSIS — K222 Esophageal obstruction: Secondary | ICD-10-CM | POA: Diagnosis present

## 2024-02-16 DIAGNOSIS — I4891 Unspecified atrial fibrillation: Secondary | ICD-10-CM | POA: Diagnosis not present

## 2024-02-16 DIAGNOSIS — Z043 Encounter for examination and observation following other accident: Secondary | ICD-10-CM | POA: Diagnosis not present

## 2024-02-16 DIAGNOSIS — N183 Chronic kidney disease, stage 3 unspecified: Secondary | ICD-10-CM | POA: Diagnosis present

## 2024-02-16 DIAGNOSIS — A419 Sepsis, unspecified organism: Secondary | ICD-10-CM | POA: Diagnosis present

## 2024-02-16 DIAGNOSIS — J984 Other disorders of lung: Secondary | ICD-10-CM | POA: Diagnosis not present

## 2024-02-16 DIAGNOSIS — R110 Nausea: Secondary | ICD-10-CM | POA: Diagnosis not present

## 2024-02-16 DIAGNOSIS — N1832 Chronic kidney disease, stage 3b: Secondary | ICD-10-CM

## 2024-02-16 DIAGNOSIS — Z952 Presence of prosthetic heart valve: Secondary | ICD-10-CM | POA: Diagnosis not present

## 2024-02-16 DIAGNOSIS — E1122 Type 2 diabetes mellitus with diabetic chronic kidney disease: Secondary | ICD-10-CM | POA: Diagnosis present

## 2024-02-16 DIAGNOSIS — M799 Soft tissue disorder, unspecified: Secondary | ICD-10-CM | POA: Diagnosis not present

## 2024-02-16 DIAGNOSIS — E7849 Other hyperlipidemia: Secondary | ICD-10-CM | POA: Diagnosis not present

## 2024-02-16 DIAGNOSIS — R079 Chest pain, unspecified: Secondary | ICD-10-CM | POA: Diagnosis present

## 2024-02-16 DIAGNOSIS — J9601 Acute respiratory failure with hypoxia: Secondary | ICD-10-CM | POA: Insufficient documentation

## 2024-02-16 DIAGNOSIS — M19022 Primary osteoarthritis, left elbow: Secondary | ICD-10-CM | POA: Diagnosis not present

## 2024-02-16 DIAGNOSIS — J811 Chronic pulmonary edema: Secondary | ICD-10-CM | POA: Diagnosis not present

## 2024-02-16 DIAGNOSIS — E1142 Type 2 diabetes mellitus with diabetic polyneuropathy: Secondary | ICD-10-CM | POA: Diagnosis present

## 2024-02-16 DIAGNOSIS — M109 Gout, unspecified: Secondary | ICD-10-CM | POA: Diagnosis present

## 2024-02-16 DIAGNOSIS — G4733 Obstructive sleep apnea (adult) (pediatric): Secondary | ICD-10-CM | POA: Diagnosis present

## 2024-02-16 DIAGNOSIS — W19XXXA Unspecified fall, initial encounter: Secondary | ICD-10-CM | POA: Diagnosis not present

## 2024-02-16 DIAGNOSIS — S0990XA Unspecified injury of head, initial encounter: Secondary | ICD-10-CM | POA: Diagnosis not present

## 2024-02-16 DIAGNOSIS — R0602 Shortness of breath: Secondary | ICD-10-CM | POA: Diagnosis not present

## 2024-02-16 DIAGNOSIS — M160 Bilateral primary osteoarthritis of hip: Secondary | ICD-10-CM | POA: Diagnosis not present

## 2024-02-16 DIAGNOSIS — R911 Solitary pulmonary nodule: Secondary | ICD-10-CM | POA: Diagnosis not present

## 2024-02-16 DIAGNOSIS — I5033 Acute on chronic diastolic (congestive) heart failure: Secondary | ICD-10-CM | POA: Diagnosis present

## 2024-02-16 DIAGNOSIS — R531 Weakness: Secondary | ICD-10-CM | POA: Diagnosis not present

## 2024-02-16 DIAGNOSIS — I2583 Coronary atherosclerosis due to lipid rich plaque: Secondary | ICD-10-CM | POA: Diagnosis not present

## 2024-02-16 DIAGNOSIS — I4819 Other persistent atrial fibrillation: Secondary | ICD-10-CM | POA: Diagnosis not present

## 2024-02-16 DIAGNOSIS — I130 Hypertensive heart and chronic kidney disease with heart failure and stage 1 through stage 4 chronic kidney disease, or unspecified chronic kidney disease: Secondary | ICD-10-CM | POA: Diagnosis present

## 2024-02-16 DIAGNOSIS — I482 Chronic atrial fibrillation, unspecified: Secondary | ICD-10-CM | POA: Diagnosis present

## 2024-02-16 DIAGNOSIS — I2510 Atherosclerotic heart disease of native coronary artery without angina pectoris: Secondary | ICD-10-CM | POA: Diagnosis present

## 2024-02-16 DIAGNOSIS — E785 Hyperlipidemia, unspecified: Secondary | ICD-10-CM | POA: Diagnosis not present

## 2024-02-16 DIAGNOSIS — Z7901 Long term (current) use of anticoagulants: Secondary | ICD-10-CM | POA: Diagnosis not present

## 2024-02-16 DIAGNOSIS — I25118 Atherosclerotic heart disease of native coronary artery with other forms of angina pectoris: Secondary | ICD-10-CM | POA: Diagnosis present

## 2024-02-16 DIAGNOSIS — K5732 Diverticulitis of large intestine without perforation or abscess without bleeding: Secondary | ICD-10-CM | POA: Diagnosis present

## 2024-02-16 DIAGNOSIS — R509 Fever, unspecified: Secondary | ICD-10-CM

## 2024-02-16 DIAGNOSIS — I10 Essential (primary) hypertension: Secondary | ICD-10-CM | POA: Diagnosis not present

## 2024-02-16 DIAGNOSIS — R0902 Hypoxemia: Secondary | ICD-10-CM | POA: Diagnosis not present

## 2024-02-16 DIAGNOSIS — Z1152 Encounter for screening for COVID-19: Secondary | ICD-10-CM | POA: Diagnosis not present

## 2024-02-16 DIAGNOSIS — K5730 Diverticulosis of large intestine without perforation or abscess without bleeding: Secondary | ICD-10-CM | POA: Diagnosis not present

## 2024-02-16 DIAGNOSIS — R609 Edema, unspecified: Secondary | ICD-10-CM | POA: Diagnosis not present

## 2024-02-16 DIAGNOSIS — E860 Dehydration: Secondary | ICD-10-CM | POA: Diagnosis present

## 2024-02-16 DIAGNOSIS — M25522 Pain in left elbow: Secondary | ICD-10-CM | POA: Diagnosis not present

## 2024-02-16 DIAGNOSIS — I350 Nonrheumatic aortic (valve) stenosis: Secondary | ICD-10-CM | POA: Diagnosis not present

## 2024-02-16 DIAGNOSIS — M47816 Spondylosis without myelopathy or radiculopathy, lumbar region: Secondary | ICD-10-CM | POA: Diagnosis not present

## 2024-02-16 DIAGNOSIS — I700 Atherosclerosis of aorta: Secondary | ICD-10-CM | POA: Diagnosis not present

## 2024-02-16 DIAGNOSIS — A09 Infectious gastroenteritis and colitis, unspecified: Secondary | ICD-10-CM | POA: Diagnosis present

## 2024-02-16 DIAGNOSIS — I4821 Permanent atrial fibrillation: Secondary | ICD-10-CM | POA: Diagnosis present

## 2024-02-16 DIAGNOSIS — I340 Nonrheumatic mitral (valve) insufficiency: Secondary | ICD-10-CM | POA: Diagnosis not present

## 2024-02-16 DIAGNOSIS — Z6832 Body mass index (BMI) 32.0-32.9, adult: Secondary | ICD-10-CM | POA: Diagnosis not present

## 2024-02-16 DIAGNOSIS — E669 Obesity, unspecified: Secondary | ICD-10-CM | POA: Diagnosis present

## 2024-02-16 DIAGNOSIS — R918 Other nonspecific abnormal finding of lung field: Secondary | ICD-10-CM | POA: Diagnosis not present

## 2024-02-16 DIAGNOSIS — M8580 Other specified disorders of bone density and structure, unspecified site: Secondary | ICD-10-CM | POA: Diagnosis not present

## 2024-02-16 DIAGNOSIS — K219 Gastro-esophageal reflux disease without esophagitis: Secondary | ICD-10-CM | POA: Diagnosis present

## 2024-02-16 DIAGNOSIS — I342 Nonrheumatic mitral (valve) stenosis: Secondary | ICD-10-CM | POA: Diagnosis not present

## 2024-02-16 LAB — URINALYSIS, W/ REFLEX TO CULTURE (INFECTION SUSPECTED)
Bilirubin Urine: NEGATIVE
Glucose, UA: NEGATIVE mg/dL
Hgb urine dipstick: NEGATIVE
Ketones, ur: NEGATIVE mg/dL
Leukocytes,Ua: NEGATIVE
Nitrite: NEGATIVE
Protein, ur: 100 mg/dL — AB
Specific Gravity, Urine: 1.023 (ref 1.005–1.030)
pH: 5 (ref 5.0–8.0)

## 2024-02-16 LAB — COMPREHENSIVE METABOLIC PANEL WITH GFR
ALT: 18 U/L (ref 0–44)
AST: 28 U/L (ref 15–41)
Albumin: 3.3 g/dL — ABNORMAL LOW (ref 3.5–5.0)
Alkaline Phosphatase: 80 U/L (ref 38–126)
Anion gap: 12 (ref 5–15)
BUN: 16 mg/dL (ref 8–23)
CO2: 27 mmol/L (ref 22–32)
Calcium: 8.6 mg/dL — ABNORMAL LOW (ref 8.9–10.3)
Chloride: 99 mmol/L (ref 98–111)
Creatinine, Ser: 1.6 mg/dL — ABNORMAL HIGH (ref 0.61–1.24)
GFR, Estimated: 40 mL/min — ABNORMAL LOW (ref >60)
Glucose, Bld: 138 mg/dL — ABNORMAL HIGH (ref 70–99)
Potassium: 4.2 mmol/L (ref 3.5–5.1)
Sodium: 138 mmol/L (ref 135–145)
Total Bilirubin: 0.8 mg/dL (ref 0.0–1.2)
Total Protein: 6.6 g/dL (ref 6.5–8.1)

## 2024-02-16 LAB — RESP PANEL BY RT-PCR (RSV, FLU A&B, COVID)  RVPGX2
Influenza A by PCR: NEGATIVE
Influenza B by PCR: NEGATIVE
Resp Syncytial Virus by PCR: NEGATIVE
SARS Coronavirus 2 by RT PCR: NEGATIVE

## 2024-02-16 LAB — RAPID URINE DRUG SCREEN, HOSP PERFORMED
Amphetamines: NOT DETECTED
Barbiturates: NOT DETECTED
Benzodiazepines: NOT DETECTED
Cocaine: NOT DETECTED
Opiates: NOT DETECTED
Tetrahydrocannabinol: NOT DETECTED

## 2024-02-16 LAB — I-STAT VENOUS BLOOD GAS, ED
Acid-Base Excess: 2 mmol/L (ref 0.0–2.0)
Bicarbonate: 27.6 mmol/L (ref 20.0–28.0)
Calcium, Ion: 1.07 mmol/L — ABNORMAL LOW (ref 1.15–1.40)
HCT: 47 % (ref 39.0–52.0)
Hemoglobin: 16 g/dL (ref 13.0–17.0)
O2 Saturation: 53 %
Potassium: 4.2 mmol/L (ref 3.5–5.1)
Sodium: 138 mmol/L (ref 135–145)
TCO2: 29 mmol/L (ref 22–32)
pCO2, Ven: 47.2 mmHg (ref 44–60)
pH, Ven: 7.375 (ref 7.25–7.43)
pO2, Ven: 29 mmHg — CL (ref 32–45)

## 2024-02-16 LAB — CBC WITH DIFFERENTIAL/PLATELET
Abs Immature Granulocytes: 0.12 K/uL — ABNORMAL HIGH (ref 0.00–0.07)
Basophils Absolute: 0 K/uL (ref 0.0–0.1)
Basophils Relative: 0 %
Eosinophils Absolute: 0 K/uL (ref 0.0–0.5)
Eosinophils Relative: 0 %
HCT: 47.4 % (ref 39.0–52.0)
Hemoglobin: 15.7 g/dL (ref 13.0–17.0)
Immature Granulocytes: 1 %
Lymphocytes Relative: 4 %
Lymphs Abs: 0.5 K/uL — ABNORMAL LOW (ref 0.7–4.0)
MCH: 28.4 pg (ref 26.0–34.0)
MCHC: 33.1 g/dL (ref 30.0–36.0)
MCV: 85.7 fL (ref 80.0–100.0)
Monocytes Absolute: 0.9 K/uL (ref 0.1–1.0)
Monocytes Relative: 6 %
Neutro Abs: 13.3 K/uL — ABNORMAL HIGH (ref 1.7–7.7)
Neutrophils Relative %: 89 %
Platelets: 124 K/uL — ABNORMAL LOW (ref 150–400)
RBC: 5.53 MIL/uL (ref 4.22–5.81)
RDW: 14.9 % (ref 11.5–15.5)
WBC: 14.9 K/uL — ABNORMAL HIGH (ref 4.0–10.5)
nRBC: 0 % (ref 0.0–0.2)

## 2024-02-16 LAB — I-STAT CHEM 8, ED
BUN: 22 mg/dL (ref 8–23)
Calcium, Ion: 1.09 mmol/L — ABNORMAL LOW (ref 1.15–1.40)
Chloride: 98 mmol/L (ref 98–111)
Creatinine, Ser: 1.6 mg/dL — ABNORMAL HIGH (ref 0.61–1.24)
Glucose, Bld: 136 mg/dL — ABNORMAL HIGH (ref 70–99)
HCT: 47 % (ref 39.0–52.0)
Hemoglobin: 16 g/dL (ref 13.0–17.0)
Potassium: 4.2 mmol/L (ref 3.5–5.1)
Sodium: 139 mmol/L (ref 135–145)
TCO2: 26 mmol/L (ref 22–32)

## 2024-02-16 LAB — I-STAT CG4 LACTIC ACID, ED
Lactic Acid, Venous: 3.5 mmol/L (ref 0.5–1.9)
Lactic Acid, Venous: 2.5 mmol/L (ref 0.5–1.9)

## 2024-02-16 LAB — ETHANOL: Alcohol, Ethyl (B): <15 mg/dL (ref <15)

## 2024-02-16 LAB — PROTIME-INR
INR: 2.3 — ABNORMAL HIGH (ref 0.8–1.2)
Prothrombin Time: 26.3 s — ABNORMAL HIGH (ref 11.4–15.2)

## 2024-02-16 LAB — TROPONIN I (HIGH SENSITIVITY)
Troponin I (High Sensitivity): 9 ng/L (ref <18)
Troponin I (High Sensitivity): 12 ng/L (ref <18)
Troponin I (High Sensitivity): 14 ng/L (ref <18)

## 2024-02-16 LAB — BRAIN NATRIURETIC PEPTIDE: B Natriuretic Peptide: 191.3 pg/mL — ABNORMAL HIGH (ref 0.0–100.0)

## 2024-02-16 MED ORDER — SODIUM CHLORIDE 0.9% FLUSH
3.0000 mL | Freq: Two times a day (BID) | INTRAVENOUS | Status: DC
  Administered 2024-02-16 – 2024-02-19 (×6): 3 mL via INTRAVENOUS

## 2024-02-16 MED ORDER — VANCOMYCIN HCL 1750 MG/350ML IV SOLN
1750.0000 mg | Freq: Once | INTRAVENOUS | Status: AC
  Administered 2024-02-16: 1750 mg via INTRAVENOUS
  Filled 2024-02-16: qty 350

## 2024-02-16 MED ORDER — ALLOPURINOL 100 MG PO TABS
100.0000 mg | ORAL_TABLET | Freq: Every day | ORAL | Status: DC
  Administered 2024-02-17 – 2024-02-19 (×3): 100 mg via ORAL
  Filled 2024-02-16 (×3): qty 1

## 2024-02-16 MED ORDER — ASPIRIN 81 MG PO TBEC
81.0000 mg | DELAYED_RELEASE_TABLET | Freq: Every day | ORAL | Status: DC
  Administered 2024-02-16 – 2024-02-19 (×4): 81 mg via ORAL
  Filled 2024-02-16 (×4): qty 1

## 2024-02-16 MED ORDER — DIPHENHYDRAMINE HCL 50 MG/ML IJ SOLN
12.5000 mg | Freq: Once | INTRAMUSCULAR | Status: AC
  Administered 2024-02-16: 12.5 mg via INTRAVENOUS
  Filled 2024-02-16: qty 1

## 2024-02-16 MED ORDER — TAMSULOSIN HCL 0.4 MG PO CAPS
0.4000 mg | ORAL_CAPSULE | Freq: Every day | ORAL | Status: DC
  Administered 2024-02-17 – 2024-02-19 (×3): 0.4 mg via ORAL
  Filled 2024-02-16 (×3): qty 1

## 2024-02-16 MED ORDER — FUROSEMIDE 10 MG/ML IJ SOLN
40.0000 mg | Freq: Once | INTRAMUSCULAR | Status: AC
  Administered 2024-02-16: 40 mg via INTRAVENOUS
  Filled 2024-02-16: qty 4

## 2024-02-16 MED ORDER — AMIODARONE HCL IN DEXTROSE 360-4.14 MG/200ML-% IV SOLN
60.0000 mg/h | INTRAVENOUS | Status: DC
  Administered 2024-02-16 (×2): 60 mg/h via INTRAVENOUS
  Filled 2024-02-16: qty 200

## 2024-02-16 MED ORDER — PANTOPRAZOLE SODIUM 40 MG PO TBEC
40.0000 mg | DELAYED_RELEASE_TABLET | Freq: Every day | ORAL | Status: DC
  Administered 2024-02-17 – 2024-02-19 (×3): 40 mg via ORAL
  Filled 2024-02-16 (×3): qty 1

## 2024-02-16 MED ORDER — METRONIDAZOLE 500 MG/100ML IV SOLN
500.0000 mg | Freq: Three times a day (TID) | INTRAVENOUS | Status: AC
  Administered 2024-02-16 (×2): 500 mg via INTRAVENOUS
  Filled 2024-02-16 (×2): qty 100

## 2024-02-16 MED ORDER — SODIUM CHLORIDE 0.9 % IV SOLN
2.0000 g | Freq: Once | INTRAVENOUS | Status: AC
  Administered 2024-02-16: 2 g via INTRAVENOUS
  Filled 2024-02-16: qty 12.5

## 2024-02-16 MED ORDER — FLUTICASONE PROPIONATE 50 MCG/ACT NA SUSP
2.0000 | Freq: Every day | NASAL | Status: DC
  Administered 2024-02-17 – 2024-02-19 (×3): 2 via NASAL
  Filled 2024-02-16: qty 16

## 2024-02-16 MED ORDER — POLYETHYLENE GLYCOL 3350 17 G PO PACK: 17.0000 g | PACK | Freq: Every day | ORAL | Status: DC | PRN

## 2024-02-16 MED ORDER — ACETAMINOPHEN 325 MG PO TABS: 650.0000 mg | ORAL_TABLET | Freq: Four times a day (QID) | ORAL | Status: DC | PRN

## 2024-02-16 MED ORDER — SODIUM CHLORIDE 0.9 % IV SOLN: Freq: Once | INTRAVENOUS | Status: AC

## 2024-02-16 MED ORDER — SODIUM CHLORIDE 0.9 % IV BOLUS: 500.0000 mL | Freq: Once | INTRAVENOUS | Status: DC

## 2024-02-16 MED ORDER — METRONIDAZOLE 500 MG/100ML IV SOLN
500.0000 mg | Freq: Two times a day (BID) | INTRAVENOUS | Status: DC
  Administered 2024-02-17 – 2024-02-19 (×5): 500 mg via INTRAVENOUS
  Filled 2024-02-16 (×5): qty 100

## 2024-02-16 MED ORDER — ACETAMINOPHEN 650 MG RE SUPP: 650.0000 mg | Freq: Four times a day (QID) | RECTAL | Status: DC | PRN

## 2024-02-16 MED ORDER — ACETAMINOPHEN 500 MG PO TABS
1000.0000 mg | ORAL_TABLET | Freq: Once | ORAL | Status: AC
  Administered 2024-02-16: 1000 mg via ORAL
  Filled 2024-02-16: qty 2

## 2024-02-16 MED ORDER — AMIODARONE HCL IN DEXTROSE 360-4.14 MG/200ML-% IV SOLN
30.0000 mg/h | INTRAVENOUS | Status: DC
  Administered 2024-02-16 – 2024-02-17 (×2): 30 mg/h via INTRAVENOUS
  Filled 2024-02-16 (×2): qty 200

## 2024-02-16 MED ORDER — AMIODARONE LOAD VIA INFUSION
150.0000 mg | Freq: Once | INTRAVENOUS | Status: AC
  Administered 2024-02-16: 150 mg via INTRAVENOUS
  Filled 2024-02-16: qty 83.34

## 2024-02-16 MED ORDER — ONDANSETRON HCL 4 MG/2ML IJ SOLN: 4.0000 mg | Freq: Four times a day (QID) | INTRAMUSCULAR | Status: DC | PRN

## 2024-02-16 MED ORDER — WARFARIN SODIUM 4 MG PO TABS
4.0000 mg | ORAL_TABLET | Freq: Once | ORAL | Status: DC
  Filled 2024-02-16: qty 1

## 2024-02-16 MED ORDER — SODIUM CHLORIDE 0.9 % IV SOLN
2.0000 g | Freq: Two times a day (BID) | INTRAVENOUS | Status: DC
  Administered 2024-02-17 – 2024-02-18 (×5): 2 g via INTRAVENOUS
  Filled 2024-02-16 (×5): qty 12.5

## 2024-02-16 MED ORDER — ONDANSETRON HCL 4 MG PO TABS: 4.0000 mg | ORAL_TABLET | Freq: Four times a day (QID) | ORAL | Status: DC | PRN

## 2024-02-16 MED ORDER — SODIUM CHLORIDE 0.9 % IV BOLUS
1000.0000 mL | Freq: Once | INTRAVENOUS | Status: AC
  Administered 2024-02-16: 1000 mL via INTRAVENOUS

## 2024-02-16 MED ORDER — METOPROLOL TARTRATE 25 MG PO TABS
12.5000 mg | ORAL_TABLET | Freq: Two times a day (BID) | ORAL | Status: DC
  Administered 2024-02-17: 12.5 mg via ORAL
  Filled 2024-02-16: qty 1

## 2024-02-16 MED ORDER — WARFARIN - PHARMACIST DOSING INPATIENT: Freq: Every day | Status: DC

## 2024-02-16 MED ORDER — ALBUTEROL SULFATE (2.5 MG/3ML) 0.083% IN NEBU: 2.5000 mg | INHALATION_SOLUTION | Freq: Four times a day (QID) | RESPIRATORY_TRACT | Status: DC | PRN

## 2024-02-16 NOTE — Progress Notes (Signed)
 PHARMACY - ANTICOAGULATION CONSULT NOTE  Pharmacy Consult for warfarin Indication: atrial fibrillation  Allergies  Allergen Reactions   Penicillins Shortness Of Breath and Swelling    Patient tolerated Rocephin  injection 2019  PATIENT HAS HAD A PCN REACTION WITH IMMEDIATE RASH, FACIAL/TONGUE/THROAT SWELLING, SOB, OR LIGHTHEADEDNESS WITH HYPOTENSION:  #  #  YES  #  Has patient had a PCN reaction causing severe rash involving mucus membranes or skin necrosis: NO Has patient had a PCN reaction that required hospitalization NO Has patient had a PCN reaction occurring within the last 10 years: NO If all of the above answers are NO, then may proceed with Cephalosporin use.    Atorvastatin  Other (See Comments)    No energy, just felt bad     Pravastatin  Other (See Comments)    myalgias   Atrovent  Nasal Spray [Ipratropium] Other (See Comments)    Dizzy with use   Hydrocodone  Itching and Other (See Comments)    swimmy headed after use   Nsaids Other (See Comments)    Due to renal function   Uloric  [Febuxostat ] Other (See Comments)    Would avoid, worsening gout sx with use.    Vancomycin  Other (See Comments)    Red man syndrome- tolerated with slow run and benadryl  use   Clarithromycin Diarrhea   Doxycycline  Other (See Comments)    GI upset     Patient Measurements:    Vital Signs: Temp: 101.4 F (38.6 C) (12/03 1026) BP: 129/58 (12/03 1200) Pulse Rate: 84 (12/03 1200)  Labs: Recent Labs    02/16/24 1040 02/16/24 1049 02/16/24 1050 02/16/24 1310  HGB 15.7 16.0 16.0  --   HCT 47.4 47.0 47.0  --   PLT 124*  --   --   --   LABPROT 26.3*  --   --   --   INR 2.3*  --   --   --   CREATININE 1.60* 1.60*  --   --   TROPONINIHS 9  --   --  12    CrCl cannot be calculated (Unknown ideal weight.).   Medical History: Past Medical History:  Diagnosis Date   Arthritis    Bradycardia    a. nocturnal with pauses overnight on tele likely due to OSA 10/2015.   CKD  (chronic kidney disease), stage III (HCC)    Coronary artery disease    a. prev nonobst. b. LHC 10/24/15: occlusion of small diffusely diseased OM2 s/p balloon angioplasty, mild nonobstructive disease of mLAD and mRCA, normal LVEDP   Diabetes mellitus without complication (HCC)    01/02/21 Pt's daughter states patient does not  have diabetes.  A1C 07/23/20 was 7.2, patientr had been on medication.   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 Wonda & Dr.  Weldner   Severe aortic stenosis    Vertigo     Assessment: 36 YOM presenting with weakness, hx of afib on warfarin PTA with last dose 12/2, INR therapeutic on admission at 2.3, notable drug drug interactions include Flagyl started this admission so will need to watch for INR uptrend   PTA dosing: 4mg  daily except 6mg  Sun/thur  Goal of Therapy:  INR 2-3 Monitor platelets by anticoagulation protocol: Yes   Plan:  Warfarin 4mg  PO x 1 today Daily INR, s/s bleeding  Dorn Poot, PharmD, [facility] Clinical Pharmacist ED Pharmacist Phone # [phone] 02/16/2024 2:42 PM

## 2024-02-16 NOTE — H&P (Signed)
 History and Physical   MONTA POLICE FMW:[id number] DOB: 06-21-1930 DOA: 02/16/2024  PCP: [name], MD   Patient coming from: Home  Chief Complaint: Weakness  HPI: [name] is a 89 y.o. male with medical history significant of hypertension, hyperlipidemia, GERD, esophageal stricture, CKD 3, CAD status post stent, A-fib, status post TAVR, OSA, obesity, gout presenting with weakness.  Patient woke this morning and tried to get out of bed however he was too weak to get out of bed/stand.  He then slid onto the floor and was too weak to get up off the floor.  Denies any traumatic event with the fall/sliding to the floor.  Is on Coumadin .  Family reports some diarrhea recently. Also reports concern that patient may have been somewhat confused this morning as well.  EMS was called and brought patient to the ED for further evaluation.  Patient denies fevers, chills, chest pain, shortness of breath, abdominal pain, constipation  Vital signs in ED notable for temperature to 100.4 ED Course:.  Blood pressure in the 90s-120s systolic.  Respiratory rate in the 20s.  Lab workup included CMP with creatinine 1.6 which is mildly elevated but near baseline, glucose 138, calcium  8.6, urine 3.3.  CBC with leukocytosis of 14.9, platelets 124.  PT 26.3, INR 2.3.  Troponin negative with repeat pending.  BNP stable at 191.  Lactic acid trend 3.5, 2.5.  Respiratory panel for flu COVID RSV negative.  Blood cultures pending.  Urinalysis pending.  Ethanol level negative, UDS negative.  VBG with normal pH and normal pCO2.  Imaging studies included chest x-ray which showed no acute normality but did show stable granulomas.  Pelvis x-ray showed possible right femur fracture.  Right elbow x-ray showed no acute abnormality.  CT head showed no acute abnormality.  CT chest abdomen pelvis showed wall thickening with associated volume Tory changes at the ascending colon consistent with colitis versus  diverticulitis.  Unable to exclude cirrhosis based on liver changes.  CT right hand obtained and was negative for fracture.  Patient received Benadryl , Tylenol , vancomycin , cefepime, 1 L IV fluids in the ED.  Started on continuous fluids as well.  Review of Systems: As per HPI otherwise all other systems reviewed and are negative.  Past Medical History:  Diagnosis Date   Arthritis    Bradycardia    a. nocturnal with pauses overnight on tele likely due to OSA 10/2015.   CKD (chronic kidney disease), stage III (HCC)    Coronary artery disease    a. prev nonobst. b. LHC 10/24/15: occlusion of small diffusely diseased OM2 s/p balloon angioplasty, mild nonobstructive disease of mLAD and mRCA, normal LVEDP   Diabetes mellitus without complication (HCC)    01/02/21 Pt's daughter states patient does not  have diabetes.  A1C 07/23/20 was 7.2, patientr had been on medication.   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 Wonda & Dr. Maryjane   Severe aortic stenosis    Vertigo     Past Surgical History:  Procedure Laterality Date   AMPUTATION Right 03/03/2019   Procedure: RIGHT 2ND TOE AMPUTATION;  Surgeon: Harden Jerona GAILS, MD;  Location: [facility] OR;  Service: Orthopedics;  Laterality: Right;   AMPUTATION Right 01/03/2021   Procedure: RIGHT 3RD TOE AMPUTATION;  Surgeon: Harden Jerona GAILS, MD;  Location: [facility] OR;   Service: Orthopedics;  Laterality: Right;   BALLOON DILATION N/A 10/22/2014   Procedure: BALLOON DILATION;  Surgeon: Lupita FORBES Commander, MD;  Location: [facility] ENDOSCOPY;  Service: Endoscopy;  Laterality: N/A;   CARDIAC CATHETERIZATION  07/2008   CARDIAC CATHETERIZATION N/A 10/24/2015   Procedure: Left Heart Cath and Coronary Angiography;  Surgeon: Lonni Hanson, MD;  Location: [facility] INVASIVE CV LAB;  Service: Cardiovascular;  Laterality: N/A;   CARDIAC CATHETERIZATION N/A 10/24/2015   Procedure: Coronary Balloon Angioplasty;  Surgeon: Lonni Hanson, MD;  Location: [facility] INVASIVE CV LAB;  Service: Cardiovascular;  Laterality: N/A;   Carotid Dopplers  09/2009   no sig extracranial stenosis and vertebral arteries had antegrade flow   CATARACT EXTRACTION W/ INTRAOCULAR LENS  IMPLANT, BILATERAL Bilateral    CORONARY STENT INTERVENTION N/A 03/11/2022   Procedure: CORONARY STENT INTERVENTION;  Surgeon: Wonda Sharper, MD;  Location: [facility] INVASIVE CV LAB;  Service: Cardiovascular;  Laterality: N/A;   ESOPHAGOGASTRODUODENOSCOPY (EGD) WITH ESOPHAGEAL DILATION     I've had it stretched 3 times (10/23/2015)   ESOPHAGOGASTRODUODENOSCOPY (EGD) WITH ESOPHAGEAL DILATION  06/2011   /medical hx above  (09/09/2017)   ESOPHAGOGASTRODUODENOSCOPY (EGD) WITH PROPOFOL  N/A 10/22/2014   Procedure: ESOPHAGOGASTRODUODENOSCOPY (EGD) WITH PROPOFOL ;  Surgeon: Lupita FORBES Commander, MD;  Location: [facility] ENDOSCOPY;  Service: Endoscopy;  Laterality: N/A;   FINGER SURGERY Right 02/1964   cut little finger off; had it reattached   I & D EXTREMITY Right 09/10/2017   Procedure: RIGHT FOOT DEBRIDEMENT;  Surgeon: Harden Jerona GAILS, MD;  Location: [facility] OR;  Service: Orthopedics;  Laterality: Right;   INTRAOPERATIVE TRANSTHORACIC ECHOCARDIOGRAM N/A 04/14/2022   Procedure: INTRAOPERATIVE TRANSTHORACIC ECHOCARDIOGRAM;  Surgeon: Wonda Sharper, MD;  Location: [facility] INVASIVE CV LAB;  Service: Open Heart Surgery;  Laterality: N/A;   MRI of brain  09/2009   chronic  microvascular ischemia   OSA Sleep Study     per Dr. Chalice   RIGHT/LEFT HEART CATH AND CORONARY ANGIOGRAPHY N/A 03/11/2022   Procedure: RIGHT/LEFT HEART CATH AND CORONARY ANGIOGRAPHY;  Surgeon: Wonda Sharper, MD;  Location: [facility] INVASIVE CV LAB;  Service: Cardiovascular;  Laterality: N/A;   SHOULDER ARTHROSCOPY W/ ROTATOR CUFF REPAIR Right 2008   TRANSCATHETER AORTIC VALVE REPLACEMENT, TRANSFEMORAL Left 04/14/2022   Procedure: Transcatheter Aortic Valve Replacement, Transfemoral;  Surgeon: Wonda Sharper, MD;  Location: [facility] INVASIVE CV LAB;  Service: Open Heart Surgery;  Laterality: Left;    Social History  reports that he quit smoking about 54 years ago. His smoking use included cigarettes. He started smoking about 58 years ago. He has a 0.5 pack-year smoking history. He has never been exposed to tobacco smoke. He has quit using smokeless tobacco. He reports that he does not currently use alcohol. He reports that he does not use drugs.  Allergies  Allergen Reactions   Penicillins Shortness Of Breath and Swelling    Patient tolerated Rocephin  injection 2019  PATIENT HAS HAD A PCN REACTION WITH IMMEDIATE  RASH, FACIAL/TONGUE/THROAT SWELLING, SOB, OR LIGHTHEADEDNESS WITH HYPOTENSION:  #  #  YES  #  Has patient had a PCN reaction causing severe rash involving mucus membranes or skin necrosis: NO Has patient had a PCN reaction that required hospitalization NO Has patient had a PCN reaction occurring within the last 10 years: NO If all of the above answers are NO, then may proceed with Cephalosporin use.    Atorvastatin  Other (See Comments)    No energy, just felt bad     Pravastatin  Other (See Comments)    myalgias   Atrovent  Nasal Spray [Ipratropium] Other (See Comments)    Dizzy with use   Hydrocodone  Itching and Other (See Comments)    swimmy headed after use   Nsaids Other (See Comments)    Due to renal function   Uloric  [Febuxostat ] Other (See Comments)    Would avoid,  worsening gout sx with use.    Vancomycin  Other (See Comments)    Red man syndrome- tolerated with slow run and benadryl  use   Clarithromycin Diarrhea   Doxycycline  Other (See Comments)    GI upset     Family History  Problem Relation Age of Onset   Stomach cancer Mother        died in 7's    Hypertension Father        died in his 10's pna likely dementia   Dementia Father    Pneumonia Father    Heart disease Brother        S/P CABG   Sleep apnea Brother    Sleep apnea Daughter    Hypertension Other    Prostate cancer Neg Hx    Colon cancer Neg Hx   Reviewed on admission  Prior to Admission medications   Medication Sig Start Date End Date Taking? Authorizing Provider  acetaminophen  (TYLENOL ) 500 MG tablet Take 500 mg by mouth as needed for mild pain.    [provider]  albuterol  (VENTOLIN  HFA) 108 (90 Base) MCG/ACT inhaler Inhale 1-2 puffs into the lungs every 6 (six) hours as needed for wheezing or shortness of breath. 02/03/23   Donnajean Lynwood DEL, PA-C  allopurinol  (ZYLOPRIM ) 100 MG tablet TAKE 1 TABLET BY MOUTH ONCE DAILY 12/20/23   [name], MD  aspirin  EC 81 MG tablet Take 1 tablet (81 mg total) by mouth daily. Swallow whole. 09/21/22   Wonda Sharper, MD  cephALEXin  (KEFLEX ) 500 MG capsule Take 4 capsules by mouth 30-60 min prior to ALL dental procedures. 02/08/24   Maccia, Melissa D, RPH-CPP  colchicine  0.6 MG tablet TAKE 1 TABLET BY MOUTH TWICE A DAY IF NEEDED THEN 1 DAILY IF NEEDED THEREAFTER FOR GOUT 12/24/23   [name], MD  cyanocobalamin  (,VITAMIN B-12,) 1000 MCG/ML injection 1000mcg IM every 28 days 09/13/20   [name], MD  diltiazem  (CARDIZEM  CD) 180 MG 24 hr capsule Take 1 capsule (180 mg total) by mouth daily. 11/09/23   Wonda Sharper, MD  furosemide  (LASIX ) 20 MG tablet TAKE 1 TABLET BY MOUTH ONCE DAILY 01/18/24   Wonda Sharper, MD  lisinopril  (ZESTRIL ) 10 MG tablet Off med as of 09/28/23 09/28/23   [name], MD   metoprolol  tartrate (LOPRESSOR ) 25 MG tablet Take 0.5 tablets (12.5 mg total) by mouth 2 (two) times daily. 01/20/24   Wonda Sharper, MD  omeprazole  (PRILOSEC ) 40 MG capsule Take 1 capsule (40 mg total) by mouth daily. 02/23/23   [name], MD  predniSONE  (DELTASONE ) 10  MG tablet Take 10 mg by mouth daily. Use as needed for gout. Patient not taking: Reported on 12/29/2023 02/23/23   [provider]  tamsulosin  (FLOMAX ) 0.4 MG CAPS capsule Take 1 capsule (0.4 mg total) by mouth daily. 09/28/23   [name], MD  warfarin (COUMADIN ) 4 MG tablet TAKE 1 TO 1 AND 1/2 TABLETS BY MOUTH DAILY OR AS DIRECTED BY COUMADIN  CLINIC 01/18/24   Wonda Sharper, MD    Physical Exam: Vitals:   02/16/24 1045 02/16/24 1100 02/16/24 1115 02/16/24 1200  BP: (!) 99/48 (!) 127/55  (!) 129/58  Pulse: 88 (!) 45 81 84  Resp: 15 17 (!) 23 18  Temp:      SpO2: 96% 95% 96% 97%    Physical Exam Constitutional:      General: He is not in acute distress.    Appearance: He is obese.  HENT:     Head: Normocephalic and atraumatic.     Mouth/Throat:     Mouth: Mucous membranes are moist.     Pharynx: Oropharynx is clear.  Eyes:     Extraocular Movements: Extraocular movements intact.     Pupils: Pupils are equal, round, and reactive to light.  Cardiovascular:     Rate and Rhythm: Normal rate and regular rhythm.     Pulses: Normal pulses.     Heart sounds: Normal heart sounds.  Pulmonary:     Effort: Pulmonary effort is normal. No respiratory distress.     Breath sounds: Normal breath sounds.  Abdominal:     General: Bowel sounds are normal. There is no distension.     Palpations: Abdomen is soft.     Tenderness: There is no abdominal tenderness.  Musculoskeletal:        General: No swelling or deformity.  Skin:    General: Skin is warm and dry.  Neurological:     General: No focal deficit present.     Mental Status: Mental status is at baseline.    Labs on Admission: I have  personally reviewed following labs and imaging studies  CBC: Recent Labs  Lab 02/16/24 1040 02/16/24 1049 02/16/24 1050  WBC 14.9*  --   --   NEUTROABS 13.3*  --   --   HGB 15.7 16.0 16.0  HCT 47.4 47.0 47.0  MCV 85.7  --   --   PLT 124*  --   --     Basic Metabolic Panel: Recent Labs  Lab 02/16/24 1040 02/16/24 1049 02/16/24 1050  NA 138 139 138  K 4.2 4.2 4.2  CL 99 98  --   CO2 27  --   --   GLUCOSE 138* 136*  --   BUN 16 22  --   CREATININE 1.60* 1.60*  --   CALCIUM  8.6*  --   --     GFR: CrCl cannot be calculated (Unknown ideal weight.).  Liver Function Tests: Recent Labs  Lab 02/16/24 1040  AST 28  ALT 18  ALKPHOS 80  BILITOT 0.8  PROT 6.6  ALBUMIN 3.3*    Urine analysis:    Component Value Date/Time   COLORURINE YELLOW 02/16/2024 1236   APPEARANCEUR CLEAR 02/16/2024 1236   LABSPEC 1.023 02/16/2024 1236   PHURINE 5.0 02/16/2024 1236   GLUCOSEU NEGATIVE 02/16/2024 1236   HGBUR NEGATIVE 02/16/2024 1236   BILIRUBINUR NEGATIVE 02/16/2024 1236   BILIRUBINUR Neg 11/23/2019 1242   KETONESUR NEGATIVE 02/16/2024 1236   PROTEINUR 100 (A) 02/16/2024 1236  UROBILINOGEN 0.2 11/23/2019 1242   UROBILINOGEN 0.2 09/22/2009 1640   NITRITE NEGATIVE 02/16/2024 1236   LEUKOCYTESUR NEGATIVE 02/16/2024 1236    Radiological Exams on Admission: CT CHEST ABDOMEN PELVIS WO CONTRAST Result Date: 02/16/2024 CLINICAL DATA:  Tried to get out of bed and slid to the floor. Nausea. Concern for sepsis. EXAM: CT CHEST, ABDOMEN AND PELVIS WITHOUT CONTRAST TECHNIQUE: Multidetector CT imaging of the chest, abdomen and pelvis was performed following the standard protocol without IV contrast. RADIATION DOSE REDUCTION: This exam was performed according to the departmental dose-optimization program which includes automated exposure control, adjustment of the mA and/or kV according to patient size and/or use of iterative reconstruction technique. COMPARISON:  CT chest dated 04/19/2023.  CTA chest abdomen and pelvis dated 03/17/2022. FINDINGS: CT CHEST FINDINGS Cardiovascular: Normal heart size. No pericardial effusion. Status post TAVR. Nonaneurysmal thoracic aorta with atherosclerotic calcification extending into the origins of the arch branch vessels. Coronary artery calcifications. Dense mitral annulus calcification. Mediastinum/Nodes: No enlarged mediastinal or axillary lymph nodes. Calcified mediastinal and hilar granulomatous nodes. Trachea and esophagus are within normal limits. Lungs/Pleura: Lungs appear generally well aerated. Stable 4 mm nodule in the right upper lobe and additional adjacent smaller 2 mm nodule, which have demonstrated long-term stability and for which no further follow-up imaging is recommended. Scattered calcified granulomas. No focal consolidation. No pleural effusion or pneumothorax. Musculoskeletal: No acute osseous abnormality. Multilevel degenerative changes of the thoracic spine, advanced at T9-T10. CT ABDOMEN PELVIS FINDINGS Hepatobiliary: Liver is stable in size and morphology with similar contour surface irregularity, can not exclude cirrhosis. No suspicious focal hepatic lesions identified within the limits of an unenhanced exam. Punctate calcified granulomas. Gallbladder is unremarkable. No biliary dilatation. Pancreas: Unremarkable. Spleen: Unremarkable. Adrenals/Urinary Tract: Adrenal glands are unremarkable. No urolithiasis or hydronephrosis. Bladder is unremarkable. Stomach/Bowel: Stomach is within normal limits. Small bowel is grossly unremarkable. Normal appendix. No obstruction. There is wall thickening and mild surrounding inflammatory stranding and haziness of the ascending colon extending to the level of the hepatic flexure, which could be secondary to a nonspecific colitis or possibly diverticulitis given the presence of right colonic diverticulosis. No evidence of perforation or abscess. Diverticulosis throughout the descending and sigmoid colon.  Vascular/Lymphatic: Abdominal aorta is normal in caliber with atherosclerotic calcification. No enlarged abdominal or pelvic lymph nodes. Reproductive: Prostate is unremarkable. Other: No abdominopelvic ascites.  No intraperitoneal free air. Musculoskeletal: No acute osseous abnormality. Multilevel degenerative changes of the lumbar spine. IMPRESSION: 1. Wall thickening of the ascending colon with mild surrounding inflammatory changes extending to the level of the hepatic flexure, which could be secondary to a nonspecific colitis or possibly diverticulitis given the presence of right colonic diverticulosis. No evidence of perforation or abscess. 2. Diverticulosis of the descending and sigmoid colon. 3. Similar liver contour surface irregularity, can not exclude cirrhosis. No suspicious focal hepatic lesion identified within the limits of an unenhanced exam. 4.  Aortic Atherosclerosis (ICD10-I70.0). Electronically Signed   By: Harrietta Sherry M.D.   On: 02/16/2024 12:55   DG Elbow Complete Left Result Date: 02/16/2024 CLINICAL DATA:  Recent fall, pain EXAM: LEFT ELBOW - COMPLETE 3+ VIEW COMPARISON:  None Available. FINDINGS: Bones are osteopenic. Minor degenerative changes of the left elbow joint. Normal alignment without acute displaced fracture or large effusion. 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 Peripheral vascular calcifications noted. Overlying artifact from the peripheral IV present. IMPRESSION: 1. Osteopenia and degenerative changes. 2. No acute osseous  finding by plain radiography. Electronically Signed   By: CHRISTELLA.  Shick M.D.   On: 02/16/2024 12:40   CT Hip Right Wo Contrast Result Date: 02/16/2024 CLINICAL DATA:  Tried to get out of bed and slid to the floor. EXAM: CT OF THE RIGHT HIP WITHOUT CONTRAST TECHNIQUE: Multidetector CT imaging of the right hip was performed according to the standard protocol. Multiplanar CT  image reconstructions were also generated. RADIATION DOSE REDUCTION: This exam was performed according to the departmental dose-optimization program which includes automated exposure control, adjustment of the mA and/or kV according to patient size and/or use of iterative reconstruction technique. COMPARISON:  None Available. FINDINGS: Bones/Joint/Cartilage No acute fracture or dislocation. The right femoral head is seated within the acetabulum. Mild osteoarthritis of the right hip with joint space narrowing and small osteophytes. The visualized right sacroiliac joint and pubic symphysis are anatomically aligned. Ligaments Ligaments are suboptimally evaluated by CT. Muscles and Tendons Muscles are within normal limits. Soft tissue No fluid collection. No enlarged lymph nodes identified in the field of view. Visualized intrapelvic contents are grossly unremarkable. Peripheral vascular calcifications. IMPRESSION: 1. No acute osseous abnormality. 2. Mild osteoarthritis of the right hip. Electronically Signed   By: Harrietta Sherry M.D.   On: 02/16/2024 12:32   DG Pelvis 1-2 Views Result Date: 02/16/2024 EXAM: 1 or 2 VIEW(S) XRAY OF THE PELVIS 02/16/2024 10:53:00 AM COMPARISON: Prominent CT 03/17/2022. CLINICAL HISTORY: Fall. FINDINGS: BONES AND JOINTS: Possible acute fracture of the right femoral neck with foreshortening. Degenerative changes of the visualized lower lumbar spine. Minimal symmetric osteoarthritic changes of the hips. Diffuse decreased bone mineralization. No joint dislocation. SOFT TISSUES: No focal abnormality. IMPRESSION: 1. Possible right femoral neck fracture. Recommend dedicated right hip series versus CT for further evaluation. 2. Diffuse decreased bone mineralization. 3. Minimal symmetric osteoarthritic changes of the hips. Electronically signed by: Toribio Agreste MD 02/16/2024 11:35 AM EST RP Workstation: [id number]   CT Head Wo Contrast Result Date: 02/16/2024 EXAM: CT HEAD WITHOUT CONTRAST  02/16/2024 11:10:21 AM TECHNIQUE: CT of the head was performed without the administration of intravenous contrast. Automated exposure control, iterative reconstruction, and/or weight based adjustment of the mA/kV was utilized to reduce the radiation dose to as low as reasonably achievable. COMPARISON: None available. CLINICAL HISTORY: Head trauma, minor (Age >= 65y) FINDINGS: BRAIN AND VENTRICLES: No acute hemorrhage. No evidence of acute infarct. No hydrocephalus. No extra-axial collection. No mass effect or midline shift. Patchy white matter hypodensities, compatible with chronic microvascular ischemic change. ORBITS: No acute abnormality. SINUSES: No acute abnormality. SOFT TISSUES AND SKULL: No acute soft tissue abnormality. No skull fracture. IMPRESSION: 1. No acute intracranial abnormality. Electronically signed by: Gilmore Molt MD 02/16/2024 11:30 AM EST RP Workstation: [id number]   DG Chest Port 1 View Result Date: 02/16/2024 EXAM: 1 VIEW(S) XRAY OF THE CHEST 02/16/2024 10:53:00 AM COMPARISON: 02/03/2023, chest CT 04/19/2023. CLINICAL HISTORY: fever FINDINGS: LUNGS AND PLEURA: Multiple calcified granulomas in lungs bilaterally, unchanged. No pleural effusion. No pneumothorax. HEART AND MEDIASTINUM: Aortic atherosclerosis. Aortic valve replacement noted. No acute abnormality of the cardiac and mediastinal silhouettes. BONES AND SOFT TISSUES: No acute osseous abnormality. IMPRESSION: 1. No acute cardiopulmonary process. 2. Multiple calcified granulomas in both lungs, unchanged. Electronically signed by: Toribio Agreste MD 02/16/2024 11:28 AM EST RP Workstation: [id number]   EKG: Independently reviewed.  Atrial fibrillation at 77 bpm.  Nonspecific T wave changes.  Assessment/Plan Principal Problem:   Sepsis (HCC) Active Problems:   Gout   OBESITY   Essential  hypertension   Coronary artery disease with exertional angina   GASTROESOPHAGEAL REFLUX DISEASE   OSA on CPAP   CKD (chronic kidney  disease), stage III (HCC)   Hyperlipidemia   Chronic atrial fibrillation (HCC)   S/P TAVR (transcatheter aortic valve replacement)   Sepsis Colitis versus diverticulitis > Patient presenting with weakness, nausea, diarrhea. > Noted to be febrile to 101.4 in the ED with tachypnea and leukocytosis to 14.9.  Meeting sepsis criteria with intra-abdominal source.  Also noted to have initial lactic acid elevated to 3.5 with some borderline blood pressure in the 90s systolic in the ED.  Received fluids and repeat lactic acid improved to 2.5. > CT showed abdominal source as above with wall thickening, inflammatory changes consistent with colitis versus diverticulitis. > Initially received vancomycin  and cefepime in the ED.  Will be continued on cefepime and Flagyl for intra-abdominal infection.  Does have history of penicillin allergy, but has tolerated cephalosporins in the past. - Monitor in progressive unit overnight - Continue with cefepime and Flagyl - Trend fever curve and WBC - Bowel rest - Continue IV fluids  Weakness > Unable to stand today.  Likely in the setting of above.  At risk for significant deconditioning at 89 years old. - PT/OT eval and treat  Hypertension - With low normal blood pressures in the ED will hold diltiazem , Lasix  - Continue metoprolol  for now  Hyperlipidemia - Not currently on medication for this, med rec pending  GERD > History of related esophageal stricture - Continue home PPI  CAD > Status post stent - Continue home aspirin , metoprolol , warfarin  Atrial fibrillation - Continue home metoprolol , warfarin - Holding diltiazem  as above  Status post TAVR - Noted  OSA - Continue home CPAP  CKD 3 > Creatinine mildly elevated but near baseline. - IV fluids as above - Trend renal function and electrolytes  Obesity - Noted  Gout - Continue allopurinol   DVT prophylaxis: Warfarin Code Status:   Full Family Communication:  Daughter updated at  bedside Disposition Plan:   Patient is from:  Home  Anticipated DC to:  Pending clinical course  Anticipated DC date:  2 to 3 days  Anticipated DC barriers: None  Consults called:  None Admission status:  Inpatient, progressive  Severity of Illness: The appropriate patient status for this patient is INPATIENT. Inpatient status is judged to be reasonable and necessary in order to provide the required intensity of service to ensure the patient's safety. The patient's presenting symptoms, physical exam findings, and initial radiographic and laboratory data in the context of their chronic comorbidities is felt to place them at high risk for further clinical deterioration. Furthermore, it is not anticipated that the patient will be medically stable for discharge from the hospital within 2 midnights of admission.   * I certify that at the point of admission it is my clinical judgment that the patient will require inpatient hospital care spanning beyond 2 midnights from the point of admission due to high intensity of service, high risk for further deterioration and high frequency of surveillance required.Nathan Marsa KATHEE Seena MD [facility]  How to contact the [facility] Attending or Consulting provider 7A - 7P or covering provider during after hours 7P -7A, for this patient?   Check the care team in [facility] and look for a) attending/consulting [facility] provider listed and b) the [facility] team listed Log into [email or web address] and use [facility]'s universal password to access. If you do not have the  password, please contact the hospital operator. Locate the [facility] provider you are looking for under [facility] and page to a number that you can be directly reached. If you still have difficulty reaching the provider, please page the [facility] (Director on Call) for the Hospitalists listed on amion for assistance.  02/16/2024, 1:59 PM

## 2024-02-16 NOTE — ED Triage Notes (Signed)
 Pt bib ems from home, tried to get out of bed and slid to the floor. Is on coumadin , did not hit head, no LOC. Endorses nausea, given 4mg  zofran  pta. Stroke scale negative. VSS with ems.

## 2024-02-16 NOTE — ED Notes (Signed)
 Respiratory at bedside.

## 2024-02-16 NOTE — ED Notes (Signed)
 MD Seena and XR at bedside.

## 2024-02-16 NOTE — ED Notes (Signed)
XR called for stat CXR

## 2024-02-16 NOTE — ED Provider Notes (Signed)
 [facility] EMERG[facility]Y DEPARTMENT AT [facility] Provider Note   CSN: [id number] Arrival date & time: 02/16/24  1011     Patient presents with: Weakness   [name] is a 89 y.o. male.   89 year old male with prior medical history as detailed below presents for evaluation.  Patient was at home.  When he woke up this morning he tried to get out of bed.  He was too weak to stand.  He slid forward onto the floor.  Subsequently, he was too weak to stand.  He could not get off the floor.  EMS was called and brought him to the ED.  He denies any significant traumatic injury.  He is on Coumadin .  He denies any recent illness.  He denies cough or congestion.  Denies chest pain.  Denies shortness of breath.  The history is provided by the patient and medical records.       Prior to Admission medications   Medication Sig Start Date End Date Taking? Authorizing Provider  acetaminophen  (TYLENOL ) 500 MG tablet Take 500 mg by mouth as needed for mild pain.    [provider]  albuterol  (VENTOLIN  HFA) 108 (90 Base) MCG/ACT inhaler Inhale 1-2 puffs into the lungs every 6 (six) hours as needed for wheezing or shortness of breath. 02/03/23   Donnajean Lynwood DEL, PA-C  allopurinol  (ZYLOPRIM ) 100 MG tablet TAKE 1 TABLET BY MOUTH ONCE DAILY 12/20/23   [name], MD  aspirin  EC 81 MG tablet Take 1 tablet (81 mg total) by mouth daily. Swallow whole. 09/21/22   Wonda Sharper, MD  cephALEXin  (KEFLEX ) 500 MG capsule Take 4 capsules by mouth 30-60 min prior to ALL dental procedures. 02/08/24   Maccia, Melissa D, RPH-CPP  colchicine  0.6 MG tablet TAKE 1 TABLET BY MOUTH TWICE A DAY IF NEEDED THEN 1 DAILY IF NEEDED THEREAFTER FOR GOUT 12/24/23   [name], MD  cyanocobalamin  (,VITAMIN B-12,) 1000 MCG/ML injection 1000mcg IM every 28 days 09/13/20   [name], MD  diltiazem  (CARDIZEM  CD) 180 MG 24 hr capsule Take 1 capsule (180 mg total) by mouth daily. 11/09/23   Wonda Sharper, MD  furosemide  (LASIX ) 20 MG tablet TAKE 1 TABLET BY MOUTH ONCE DAILY 01/18/24   Wonda Sharper, MD  lisinopril  (ZESTRIL ) 10 MG tablet Off med as of 09/28/23 09/28/23   [name], MD  metoprolol  tartrate (LOPRESSOR ) 25 MG tablet Take 0.5 tablets (12.5 mg total) by mouth 2 (two) times daily. 01/20/24   Wonda Sharper, MD  omeprazole  (PRILOSEC ) 40 MG capsule Take 1 capsule (40 mg total) by mouth daily. 02/23/23   [name], MD  predniSONE  (DELTASONE ) 10 MG tablet Take 10 mg by mouth daily. Use as needed for gout. Patient not taking: Reported on 12/29/2023 02/23/23   [provider]  tamsulosin  (FLOMAX ) 0.4 MG CAPS capsule Take 1 capsule (0.4 mg total) by mouth daily. 09/28/23   [name], MD  warfarin (COUMADIN ) 4 MG tablet TAKE 1 TO 1 AND 1/2 TABLETS BY MOUTH DAILY OR AS DIRECTED BY COUMADIN  CLINIC 01/18/24   Wonda Sharper, MD    Allergies: Penicillins, Atorvastatin , Pravastatin , Atrovent  nasal spray [ipratropium], Hydrocodone , Nsaids, Uloric  [febuxostat ], Vancomycin , Clarithromycin, and Doxycycline     Review of Systems  All other systems reviewed and are negative.   Updated Vital Signs BP (!) 101/49   Pulse 73   Temp (!) 101.4 F (38.6 C)   Resp 19   SpO2 97%  Physical Exam Vitals and nursing note reviewed.  Constitutional:      General: He is not in acute distress.    Appearance: He is well-developed.  HENT:     Head: Normocephalic and atraumatic.  Eyes:     Conjunctiva/sclera: Conjunctivae normal.  Cardiovascular:     Rate and Rhythm: Normal rate. Rhythm irregular.     Heart sounds: No murmur heard. Pulmonary:     Effort: Pulmonary effort is normal. No respiratory distress.     Breath sounds: Normal breath sounds.  Abdominal:     Palpations: Abdomen is soft.     Tenderness: There is no abdominal tenderness.  Musculoskeletal:        General: No swelling.     Cervical back: Neck supple.  Skin:    General: Skin is warm and dry.      Capillary Refill: Capillary refill takes less than 2 seconds.  Neurological:     Mental Status: He is alert.  Psychiatric:        Mood and Affect: Mood normal.     (all labs ordered are listed, but only abnormal results are displayed) Labs Reviewed  RESP PANEL BY RT-PCR (RSV, FLU A&B, COVID)  RVPGX2  CULTURE, BLOOD (ROUTINE X 2)  CULTURE, BLOOD (ROUTINE X 2)  CBC WITH DIFFERENTIAL/PLATELET  PROTIME-INR  ETHANOL  COMPREHENSIVE METABOLIC PANEL WITH GFR  URINALYSIS, W/ REFLEX TO CULTURE (INFECTION SUSPECTED)  RAPID URINE DRUG SCREEN, HOSP PERFORMED  BRAIN NATRIURETIC PEPTIDE  I-STAT VENOUS BLOOD GAS, ED  I-STAT CHEM 8, ED  I-STAT CG4 LACTIC ACID, ED  TROPONIN I (HIGH SENSITIVITY)    EKG: EKG Interpretation Date/Time:  Wednesday February 16 2024 10:25:50 EST Ventricular Rate:  77 PR Interval:    QRS Duration:  79 QT Interval:  369 QTC Calculation: 418 R Axis:   47  Text Interpretation: Atrial fibrillation Borderline low voltage, extremity leads Borderline ST depression, lateral leads Confirmed by Laurice Coy [phone]) on 02/16/2024 10:26:42 AM  Radiology: No results found.   Procedures   Medications Ordered in the ED  acetaminophen  (TYLENOL ) tablet 1,000 mg (has no administration in time range)  sodium chloride  0.9 % bolus 500 mL (has no administration in time range)                                    Medical Decision Making Patient presents with profound weakness and inability to ambulate.  He is noted to be febrile to 101.4 on arrival.  Broad-spectrum antibiotics administered for possible early sepsis.  Labs obtained are concerning for elevated creatinine above baseline.  IV fluids administered.  Patient without clear evidence of significant traumatic injury.  Patient would benefit from admission for further workup and treatment.  Hospitalist service made aware of case.  Amount and/or Complexity of Data Reviewed Labs: ordered. Radiology:  ordered.  Risk OTC drugs. Prescription drug management. Decision regarding hospitalization.   CRITICAL CARE Performed by: Coy JAYSON Laurice   Total critical care time: 30 minutes  Critical care time was exclusive of separately billable procedures and treating other patients.  Critical care was necessary to treat or prevent imminent or life-threatening deterioration.  Critical care was time spent personally by me on the following activities: development of treatment plan with patient and/or surrogate as well as nursing, discussions with consultants, evaluation of patient's response to treatment, examination of patient, obtaining history from patient or surrogate, ordering and performing treatments  and interventions, ordering and review of laboratory studies, ordering and review of radiographic studies, pulse oximetry and re-evaluation of patient's condition.      Final diagnoses:  Weakness  Fever, unspecified fever cause    ED Discharge Orders     None          Laurice Maude BROCKS, MD 02/16/24 1351

## 2024-02-16 NOTE — Progress Notes (Signed)
 ED Pharmacy Antibiotic Sign Off An antibiotic consult was received from an ED provider for cefepime  and vancomycin  per pharmacy dosing for sepsis. A chart review was completed to assess appropriateness. Noted hx of PCN allegry (tolerated ceftriaxone  previously) + hx of infusion reaction with vancomycin .   The following one time order(s) were placed:  Cefepime  2g x1  Vancomycin  1750mg  x1, slowed infusion + benadryl  prior to administration   Further antibiotic and/or antibiotic pharmacy consults should be ordered by the admitting provider if indicated.   Thank you for allowing pharmacy to be a part of this patient's care.   Powell Blush, PharmD, BCCCP  Clinical Pharmacist 02/16/24 11:28 AM

## 2024-02-16 NOTE — ED Notes (Signed)
 Pt attempted to stand to use bedside commode with assistance from ED tech. Pt had difficulty getting out of bed and was and assisted back into bed.   Pt appeared SOB after repositioning in bed. Pt placed on 6L nasal cannula and MD Seena raker.   Pt satting 86% on 6L nasal cannula. Pt switched to non-rebreather 15L. MD Seena raker.   Pt satting 95% on non-rebreather 15L.

## 2024-02-16 NOTE — Progress Notes (Signed)
  Progress Note  Acute Respiratory Failure with Hypoxia Respiratory distress A-fib with RVR > Patient is being assisted to and from the bedside commode.  Suddenly became significantly tachypneic and short of breath and purpleish tinge to his skin per RN. > This placed on oxygen and titrated up to 6 L but was continued to saturate in the upper 80s and so was placed on a facemask. > On exam found to be tachycardic and irregular.  Lung sounds somewhat distant and coarse but difficult to auscultate. > Stat chest x-ray being performed at the time of exam was suspicious for volume overload.  Patient does have CHF with borderline BNP on arrival and received IV fluids for possible sepsis. > Blood pressure has improved we will proceed with IV diuresis for volume overload. Amiodarone for A-fib with RVR; in the setting of suspected CHF exacerbation will avoid diltiazem  infusion. > EKG ordered which showed atrial fibrillation with RVR.  Nonspecific T wave changes.  Baseline artifact and mild baseline wander. - Patient already ordered for progressive unit - Continue supplemental oxygen, wean as tolerated - As needed BiPAP if fails to improve or becomes tired - 40 mg IV Lasix  x 1 - Amiodarone infusion - Follow-up chest x-ray read - Stat troponin

## 2024-02-17 ENCOUNTER — Inpatient Hospital Stay (HOSPITAL_COMMUNITY)

## 2024-02-17 DIAGNOSIS — I342 Nonrheumatic mitral (valve) stenosis: Secondary | ICD-10-CM

## 2024-02-17 DIAGNOSIS — I340 Nonrheumatic mitral (valve) insufficiency: Secondary | ICD-10-CM

## 2024-02-17 DIAGNOSIS — A419 Sepsis, unspecified organism: Secondary | ICD-10-CM | POA: Diagnosis not present

## 2024-02-17 DIAGNOSIS — I350 Nonrheumatic aortic (valve) stenosis: Secondary | ICD-10-CM

## 2024-02-17 LAB — COMPREHENSIVE METABOLIC PANEL WITH GFR
ALT: 17 U/L (ref 0–44)
AST: 29 U/L (ref 15–41)
Albumin: 2.9 g/dL — ABNORMAL LOW (ref 3.5–5.0)
Alkaline Phosphatase: 70 U/L (ref 38–126)
Anion gap: 9 (ref 5–15)
BUN: 16 mg/dL (ref 8–23)
CO2: 26 mmol/L (ref 22–32)
Calcium: 8.1 mg/dL — ABNORMAL LOW (ref 8.9–10.3)
Chloride: 101 mmol/L (ref 98–111)
Creatinine, Ser: 1.47 mg/dL — ABNORMAL HIGH (ref 0.61–1.24)
GFR, Estimated: 45 mL/min — ABNORMAL LOW (ref >60)
Glucose, Bld: 115 mg/dL — ABNORMAL HIGH (ref 70–99)
Potassium: 3.8 mmol/L (ref 3.5–5.1)
Sodium: 136 mmol/L (ref 135–145)
Total Bilirubin: 1 mg/dL (ref 0.0–1.2)
Total Protein: 6.1 g/dL — ABNORMAL LOW (ref 6.5–8.1)

## 2024-02-17 LAB — CBC
HCT: 44.3 % (ref 39.0–52.0)
Hemoglobin: 15 g/dL (ref 13.0–17.0)
MCH: 28.5 pg (ref 26.0–34.0)
MCHC: 33.9 g/dL (ref 30.0–36.0)
MCV: 84.2 fL (ref 80.0–100.0)
Platelets: 112 K/uL — ABNORMAL LOW (ref 150–400)
RBC: 5.26 MIL/uL (ref 4.22–5.81)
RDW: 15.2 % (ref 11.5–15.5)
WBC: 11.7 K/uL — ABNORMAL HIGH (ref 4.0–10.5)
nRBC: 0 % (ref 0.0–0.2)

## 2024-02-17 LAB — CORTISOL-AM, BLOOD: Cortisol - AM: 17.6 ug/dL (ref 6.7–22.6)

## 2024-02-17 LAB — T4, FREE: Free T4: 0.8 ng/dL (ref 0.61–1.12)

## 2024-02-17 LAB — PROTIME-INR
INR: 1.9 — ABNORMAL HIGH (ref 0.8–1.2)
Prothrombin Time: 22.6 s — ABNORMAL HIGH (ref 11.4–15.2)

## 2024-02-17 LAB — TSH: TSH: 1.676 u[IU]/mL (ref 0.350–4.500)

## 2024-02-17 LAB — MAGNESIUM: Magnesium: 1.8 mg/dL (ref 1.7–2.4)

## 2024-02-17 LAB — MRSA NEXT GEN BY PCR, NASAL: MRSA by PCR Next Gen: NOT DETECTED

## 2024-02-17 MED ORDER — WARFARIN SODIUM 6 MG PO TABS
6.0000 mg | ORAL_TABLET | Freq: Once | ORAL | Status: AC
  Administered 2024-02-17: 6 mg via ORAL
  Filled 2024-02-17: qty 1

## 2024-02-17 MED ORDER — METOPROLOL TARTRATE 25 MG PO TABS
25.0000 mg | ORAL_TABLET | Freq: Two times a day (BID) | ORAL | Status: DC
  Administered 2024-02-17 – 2024-02-19 (×4): 25 mg via ORAL
  Filled 2024-02-17 (×4): qty 1

## 2024-02-17 MED ORDER — CYANOCOBALAMIN 1000 MCG/ML IJ SOLN
1000.0000 ug | INTRAMUSCULAR | Status: DC
  Administered 2024-02-17: 1000 ug via INTRAMUSCULAR
  Filled 2024-02-17: qty 1

## 2024-02-17 MED ORDER — ALBUTEROL SULFATE (2.5 MG/3ML) 0.083% IN NEBU: 2.5000 mg | INHALATION_SOLUTION | Freq: Four times a day (QID) | RESPIRATORY_TRACT | Status: DC | PRN

## 2024-02-17 NOTE — Progress Notes (Signed)
 PROGRESS NOTE     Patient Demographics:    [name], is a 89 y.o. male, DOB - 1930/07/26, FMW:[id number]  Outpatient Primary MD for the patient is [name], MD    LOS - 1  Admit date - 02/16/2024    Chief Complaint  Patient presents with   Weakness       Brief Narrative (HPI from H&P)    89 y.o. male with medical history significant of hypertension, hyperlipidemia, GERD, esophageal stricture, CKD 3, CAD status post stent, A-fib, status post TAVR, OSA, obesity, gout presenting with weakness.  Apparently he ate some dinner 2 days ago after which he developed profound diarrhea, subsequently developed generalized weakness and got dizzy upon standing up.  He also slid on the floor while getting out of the bed at home, came to the ER where he was diagnosed with dehydration, hypotension, CT suggestive of possible colitis versus diverticulitis, went briefly into A-fib RVR in the ER requiring amiodarone drip.  Was admitted for further care   Subjective:    Derrill Sander today has, No headache, No chest pain, No abdominal pain - No Nausea, No new weakness tingling or numbness, no shortness of breath   Assessment  & Plan :   Sepsis Colitis versus diverticulitis > Patient presenting with weakness, nausea, diarrhea after having some dinner.  No other family members got sick, no other sick contacts, has been treated for infectious colitis/diverticulitis with antibiotics, CT noted, overall much improved diarrhea has improved.  Monitor on soft diet, continue antibiotics, has been hydrated.  Monitor cultures.  Clinically appears to be improving, no further diarrhea in the last 12 hours.  Weakness > Unable to stand today.   Likely in the setting of above.  At risk for significant deconditioning at 89 years old. - PT/OT eval and treat   Paroxysmal atrial fibrillation, Chad vasc 2 score of greater than 3, history of TAVR.  On beta-blocker and Coumadin  continued, went into RVR in the ER likely due to dehydration from #1 above, currently on amiodarone drip.  Cardiology requested to evaluate.  Check TSH  and free T4.    Hypertension - With low normal blood pressures in the ED will hold diltiazem , Lasix  - Continue metoprolol  for now   Hyperlipidemia - Not currently on medication for this, med rec pending   GERD > History of related esophageal stricture - Continue home PPI   CAD > Status post stent - Continue home aspirin , metoprolol , warfarin   OSA - Continue home CPAP nightly.   CKD 3 > Creatinine mildly elevated but near baseline. - IV fluids as above - Trend renal function and electrolytes   Obesity - Noted   Gout - Continue allopurinol       Condition - Extremely Guarded  Family Communication  : Wife bedside on 02/17/2024  Code Status : Full code  Consults  : Cardiology  PUD Prophylaxis : PPI   Procedures  :     CT head, CT right hip.  Nonacute.  CT chest abdomen pelvis. 1. Wall thickening of the ascending colon with mild surrounding inflammatory changes extending to the level of the hepatic flexure, which could be secondary to a nonspecific colitis or possibly diverticulitis given the presence of right colonic diverticulosis. No evidence of perforation or abscess. 2. Diverticulosis of the descending and sigmoid colon. 3. Similar liver contour surface irregularity, can not exclude cirrhosis. No suspicious focal hepatic lesion identified within the limits of an unenhanced exam. 4.  Aortic Atherosclerosis (ICD10-I70.0).       Disposition Plan  :    Status is: Inpatient   DVT Prophylaxis  :     warfarin (COUMADIN ) tablet 6 mg   Lab Results  Component Value Date   INR 1.9 (H)  02/17/2024   INR 2.3 (H) 02/16/2024   INR 1.5 02/03/2024     Lab Results  Component Value Date   PLT 112 (L) 02/17/2024    Diet :  Diet Order             DIET SOFT Fluid consistency: Thin  Diet effective now                    Inpatient Medications  Scheduled Meds:  allopurinol   100 mg Oral Daily   aspirin  EC  81 mg Oral Daily   cyanocobalamin   1,000 mcg Intramuscular Q30 days   fluticasone   2 spray Each Nare Daily   metoprolol  tartrate  12.5 mg Oral BID   pantoprazole   40 mg Oral Daily   sodium chloride  flush  3 mL Intravenous Q12H   tamsulosin   0.4 mg Oral Daily   warfarin  6 mg Oral ONCE-1600   Warfarin - Pharmacist Dosing Inpatient   Does not apply q1600   Continuous Infusions:  amiodarone 30 mg/hr (02/17/24 0651)   ceFEPime (MAXIPIME) IV Stopped (02/17/24 0030)   metronidazole 500 mg (02/17/24 0854)   PRN Meds:.acetaminophen  **OR** acetaminophen , albuterol , ondansetron  **OR** ondansetron  (ZOFRAN ) IV, polyethylene glycol     Objective:   Vitals:   02/17/24 0015 02/17/24 0129 02/17/24 0400 02/17/24 0800  BP:  117/69 123/67 116/75  Pulse: 95 97 91 89  Resp: (!) 27 19 (!) 21 20  Temp:  (!) 97.3 F (36.3 C) 98.6 F (37 C) 98.3 F (36.8 C)  TempSrc:  Axillary Oral Oral  SpO2: 100% 98% 98% 96%  Weight:  90 kg    Height:  5' 6 (1.676 m)      Wt Readings from Last 3 Encounters:  02/17/24 90 kg  12/29/23 89.4 kg  12/08/23 88.5 kg  Intake/Output Summary (Last 24 hours) at 02/17/2024 0856 Last data filed at 02/17/2024 0651 Gross per 24 hour  Intake 874.63 ml  Output 1400 ml  Net -525.37 ml     Physical Exam  Awake Alert, No new F.N deficits, Normal affect [place].AT,PERRAL Supple Neck, No JVD,   Symmetrical Chest wall movement, Good air movement bilaterally, CTAB RRR,No Gallops,Rubs or new Murmurs,  +ve B.Sounds, Abd Soft, No tenderness,   No Cyanosis, Clubbing or edema        Data Review:    Recent Labs  Lab 02/16/24 1040  02/16/24 1049 02/16/24 1050 02/17/24 0357  WBC 14.9*  --   --  11.7*  HGB 15.7 16.0 16.0 15.0  HCT 47.4 47.0 47.0 44.3  PLT 124*  --   --  112*  MCV 85.7  --   --  84.2  MCH 28.4  --   --  28.5  MCHC 33.1  --   --  33.9  RDW 14.9  --   --  15.2  LYMPHSABS 0.5*  --   --   --   MONOABS 0.9  --   --   --   EOSABS 0.0  --   --   --   BASOSABS 0.0  --   --   --     Recent Labs  Lab 02/16/24 1040 02/16/24 1049 02/16/24 1050 02/16/24 1318 02/17/24 0357  NA 138 139 138  --  136  K 4.2 4.2 4.2  --  3.8  CL 99 98  --   --  101  CO2 27  --   --   --  26  ANIONGAP 12  --   --   --  9  GLUCOSE 138* 136*  --   --  115*  BUN 16 22  --   --  16  CREATININE 1.60* 1.60*  --   --  1.47*  AST 28  --   --   --  29  ALT 18  --   --   --  17  ALKPHOS 80  --   --   --  70  BILITOT 0.8  --   --   --  1.0  ALBUMIN 3.3*  --   --   --  2.9*  LATICACIDVEN  --   --  3.5* 2.5*  --   INR 2.3*  --   --   --  1.9*  BNP 191.3*  --   --   --   --   MG  --   --   --   --  1.8  CALCIUM  8.6*  --   --   --  8.1*      Recent Labs  Lab 02/16/24 1040 02/16/24 1050 02/16/24 1318 02/17/24 0357  LATICACIDVEN  --  3.5* 2.5*  --   INR 2.3*  --   --  1.9*  BNP 191.3*  --   --   --   MG  --   --   --  1.8  CALCIUM  8.6*  --   --  8.1*    --------------------------------------------------------------------------------------------------------------- Lab Results  Component Value Date   CHOL 187 09/28/2023   HDL 26.00 (L) 09/28/2023   LDLCALC 120 (H) 09/28/2023   LDLDIRECT 95.0 05/16/2019   TRIG 205.0 (H) 09/28/2023   CHOLHDL 7 09/28/2023    Lab Results  Component Value Date   HGBA1C 6.8 (H) 09/28/2023   No results for input(s): TSH,  T4TOTAL, FREET4, T3FREE, THYROIDAB in the last 72 hours. No results for input(s): VITAMINB12, FOLATE, FERRITIN, TIBC, IRON, RETICCTPCT in the last 72  hours. ------------------------------------------------------------------------------------------------------------------ Cardiac Enzymes No results for input(s): CKMB, TROPONINI, MYOGLOBIN in the last 168 hours.  Invalid input(s): CK  Micro Results Recent Results (from the past 240 hours)  Resp panel by RT-PCR (RSV, Flu A&B, Covid) Anterior Nasal Swab     Status: None   Collection Time: 02/16/24 10:25 AM   Specimen: Anterior Nasal Swab  Result Value Ref Range Status   SARS Coronavirus 2 by RT PCR NEGATIVE NEGATIVE Final   Influenza A by PCR NEGATIVE NEGATIVE Final   Influenza B by PCR NEGATIVE NEGATIVE Final    Comment: (NOTE) The Xpert Xpress SARS-CoV-2/FLU/RSV plus assay is intended as an aid in the diagnosis of influenza from Nasopharyngeal swab specimens and should not be used as a sole basis for treatment. Nasal washings and aspirates are unacceptable for Xpert Xpress SARS-CoV-2/FLU/RSV testing.  Fact Sheet for Patients: bloggercourse.com  Fact Sheet for Healthcare Providers: seriousbroker.it  This test is not yet approved or cleared by the [place]  FDA and has been authorized for detection and/or diagnosis of SARS-CoV-2 by FDA under an Emergency Use Authorization (EUA). This EUA will remain in effect (meaning this test can be used) for the duration of the COVID-19 declaration under Section 564(b)(1) of the Act, 21 U.S.C. section 360bbb-3(b)(1), unless the authorization is terminated or revoked.     Resp Syncytial Virus by PCR NEGATIVE NEGATIVE Final    Comment: (NOTE) Fact Sheet for Patients: bloggercourse.com  Fact Sheet for Healthcare Providers: seriousbroker.it  This test is not yet approved or cleared by the [place]  FDA and has been authorized for detection and/or diagnosis of SARS-CoV-2 by FDA under an Emergency Use Authorization (EUA). This  EUA will remain in effect (meaning this test can be used) for the duration of the COVID-19 declaration under Section 564(b)(1) of the Act, 21 U.S.C. section 360bbb-3(b)(1), unless the authorization is terminated or revoked.  Performed at [facility], [address], [place], [place] [address]   MRSA Next Gen by PCR, Nasal     Status: None   Collection Time: 02/17/24  1:39 AM   Specimen: Nasal Mucosa; Nasal Swab  Result Value Ref Range Status   MRSA by PCR Next Gen NOT DETECTED NOT DETECTED Final    Comment: (NOTE) The GeneXpert MRSA Assay (FDA approved for NASAL specimens only), is one component of a comprehensive MRSA colonization surveillance program. It is not intended to diagnose MRSA infection nor to guide or monitor treatment for MRSA infections. Test performance is not FDA approved in patients less than 12 years old. Performed at [facility], [address], [place], [place] [address]     Radiology Report DG Chest Port 1 View Result Date: 02/17/2024 CLINICAL DATA:  Shortness of breath EXAM: PORTABLE CHEST 1 VIEW COMPARISON:  Chest radiograph dated 02/16/2024 FINDINGS: Normal lung volumes. Increased diffuse interstitial opacities, right-greater-than-left. No pleural effusion or pneumothorax. The heart size and mediastinal contours are within normal limits. No acute osseous abnormality. IMPRESSION: Increased diffuse interstitial opacities, right-greater-than-left, which may represent pulmonary edema or atypical infection. Electronically Signed   By: Limin  Xu M.D.   On: 02/17/2024 07:51   DG CHEST PORT 1 VIEW Result Date: 02/16/2024 EXAM: 1 VIEW(S) XRAY OF THE CHEST 02/16/2024 05:43:00 PM COMPARISON: None available. CLINICAL HISTORY: Acute respiratory failure with hypoxia (HCC) [id number]. FINDINGS: LUNGS AND PLEURA: Mildly increased  interstitial densities are noted bilaterally consistent with pulmonary edema. No pleural effusion. No pneumothorax. HEART AND MEDIASTINUM:  Stable cardiomediastinal silhouette. BONES AND SOFT TISSUES: No acute osseous abnormality. IMPRESSION: 1. Mild interstitial pulmonary edema bilaterally. Electronically signed by: Lynwood Seip MD 02/16/2024 07:00 PM EST RP Workstation: [id number]   CT CHEST ABDOMEN PELVIS WO CONTRAST Result Date: 02/16/2024 CLINICAL DATA:  Tried to get out of bed and slid to the floor. Nausea. Concern for sepsis. EXAM: CT CHEST, ABDOMEN AND PELVIS WITHOUT CONTRAST TECHNIQUE: Multidetector CT imaging of the chest, abdomen and pelvis was performed following the standard protocol without IV contrast. RADIATION DOSE REDUCTION: This exam was performed according to the departmental dose-optimization program which includes automated exposure control, adjustment of the mA and/or kV according to patient size and/or use of iterative reconstruction technique. COMPARISON:  CT chest dated 04/19/2023. CTA chest abdomen and pelvis dated 03/17/2022. FINDINGS: CT CHEST FINDINGS Cardiovascular: Normal heart size. No pericardial effusion. Status post TAVR. Nonaneurysmal thoracic aorta with atherosclerotic calcification extending into the origins of the arch branch vessels. Coronary artery calcifications. Dense mitral annulus calcification. Mediastinum/Nodes: No enlarged mediastinal or axillary lymph nodes. Calcified mediastinal and hilar granulomatous nodes. Trachea and esophagus are within normal limits. Lungs/Pleura: Lungs appear generally well aerated. Stable 4 mm nodule in the right upper lobe and additional adjacent smaller 2 mm nodule, which have demonstrated long-term stability and for which no further follow-up imaging is recommended. Scattered calcified granulomas. No focal consolidation. No pleural effusion or pneumothorax. Musculoskeletal: No acute osseous abnormality. Multilevel degenerative changes of the thoracic spine, advanced at T9-T10. CT ABDOMEN PELVIS FINDINGS Hepatobiliary: Liver is stable in size and morphology with similar  contour surface irregularity, can not exclude cirrhosis. No suspicious focal hepatic lesions identified within the limits of an unenhanced exam. Punctate calcified granulomas. Gallbladder is unremarkable. No biliary dilatation. Pancreas: Unremarkable. Spleen: Unremarkable. Adrenals/Urinary Tract: Adrenal glands are unremarkable. No urolithiasis or hydronephrosis. Bladder is unremarkable. Stomach/Bowel: Stomach is within normal limits. Small bowel is grossly unremarkable. Normal appendix. No obstruction. There is wall thickening and mild surrounding inflammatory stranding and haziness of the ascending colon extending to the level of the hepatic flexure, which could be secondary to a nonspecific colitis or possibly diverticulitis given the presence of right colonic diverticulosis. No evidence of perforation or abscess. Diverticulosis throughout the descending and sigmoid colon. Vascular/Lymphatic: Abdominal aorta is normal in caliber with atherosclerotic calcification. No enlarged abdominal or pelvic lymph nodes. Reproductive: Prostate is unremarkable. Other: No abdominopelvic ascites.  No intraperitoneal free air. Musculoskeletal: No acute osseous abnormality. Multilevel degenerative changes of the lumbar spine. IMPRESSION: 1. Wall thickening of the ascending colon with mild surrounding inflammatory changes extending to the level of the hepatic flexure, which could be secondary to a nonspecific colitis or possibly diverticulitis given the presence of right colonic diverticulosis. No evidence of perforation or abscess. 2. Diverticulosis of the descending and sigmoid colon. 3. Similar liver contour surface irregularity, can not exclude cirrhosis. No suspicious focal hepatic lesion identified within the limits of an unenhanced exam. 4.  Aortic Atherosclerosis (ICD10-I70.0). Electronically Signed   By: Harrietta Sherry M.D.   On: 02/16/2024 12:55   DG Elbow Complete Left Result Date: 02/16/2024 CLINICAL DATA:  Recent  fall, pain EXAM: LEFT ELBOW - COMPLETE 3+ VIEW COMPARISON:  None Available. FINDINGS: Bones are osteopenic. Minor degenerative changes of the left elbow joint. Normal alignment without acute displaced fracture or large effusion. 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 Peripheral vascular calcifications noted. Overlying artifact from the peripheral IV present. IMPRESSION: 1. Osteopenia and degenerative changes. 2. No acute osseous finding by plain radiography. Electronically Signed   By: CHRISTELLA.  Shick M.D.   On: 02/16/2024 12:40   CT Hip Right Wo Contrast Result Date: 02/16/2024 CLINICAL DATA:  Tried to get out of bed and slid to the floor. EXAM: CT OF THE RIGHT HIP WITHOUT CONTRAST TECHNIQUE: Multidetector CT imaging of the right hip was performed according to the standard protocol. Multiplanar CT image reconstructions were also generated. RADIATION DOSE REDUCTION: This exam was performed according to the departmental dose-optimization program which includes automated exposure control, adjustment of the mA and/or kV according to patient size and/or use of iterative reconstruction technique. COMPARISON:  None Available. FINDINGS: Bones/Joint/Cartilage No acute fracture or dislocation. The right femoral head is seated within the acetabulum. Mild osteoarthritis of the right hip with joint space narrowing and small osteophytes. The visualized right sacroiliac joint and pubic symphysis are anatomically aligned. Ligaments Ligaments are suboptimally evaluated by CT. Muscles and Tendons Muscles are within normal limits. Soft tissue No fluid collection. No enlarged lymph nodes identified in the field of view. Visualized intrapelvic contents are grossly unremarkable. Peripheral vascular calcifications. IMPRESSION: 1. No acute osseous abnormality. 2. Mild osteoarthritis of the right hip. Electronically Signed   By: Harrietta Sherry M.D.   On:  02/16/2024 12:32   DG Pelvis 1-2 Views Result Date: 02/16/2024 EXAM: 1 or 2 VIEW(S) XRAY OF THE PELVIS 02/16/2024 10:53:00 AM COMPARISON: Prominent CT 03/17/2022. CLINICAL HISTORY: Fall. FINDINGS: BONES AND JOINTS: Possible acute fracture of the right femoral neck with foreshortening. Degenerative changes of the visualized lower lumbar spine. Minimal symmetric osteoarthritic changes of the hips. Diffuse decreased bone mineralization. No joint dislocation. SOFT TISSUES: No focal abnormality. IMPRESSION: 1. Possible right femoral neck fracture. Recommend dedicated right hip series versus CT for further evaluation. 2. Diffuse decreased bone mineralization. 3. Minimal symmetric osteoarthritic changes of the hips. Electronically signed by: Toribio Agreste MD 02/16/2024 11:35 AM EST RP Workstation: [id number]   CT Head Wo Contrast Result Date: 02/16/2024 EXAM: CT HEAD WITHOUT CONTRAST 02/16/2024 11:10:21 AM TECHNIQUE: CT of the head was performed without the administration of intravenous contrast. Automated exposure control, iterative reconstruction, and/or weight based adjustment of the mA/kV was utilized to reduce the radiation dose to as low as reasonably achievable. COMPARISON: None available. CLINICAL HISTORY: Head trauma, minor (Age >= 65y) FINDINGS: BRAIN AND VENTRICLES: No acute hemorrhage. No evidence of acute infarct. No hydrocephalus. No extra-axial collection. No mass effect or midline shift. Patchy white matter hypodensities, compatible with chronic microvascular ischemic change. ORBITS: No acute abnormality. SINUSES: No acute abnormality. SOFT TISSUES AND SKULL: No acute soft tissue abnormality. No skull fracture. IMPRESSION: 1. No acute intracranial abnormality. Electronically signed by: Gilmore Molt MD 02/16/2024 11:30 AM EST RP Workstation: [id number]   DG Chest Port 1 View Result Date: 02/16/2024 EXAM: 1 VIEW(S) XRAY OF THE CHEST 02/16/2024 10:53:00 AM COMPARISON: 02/03/2023, chest CT  04/19/2023. CLINICAL HISTORY: fever FINDINGS: LUNGS AND PLEURA: Multiple calcified granulomas in lungs bilaterally, unchanged. No pleural effusion. No pneumothorax. HEART AND MEDIASTINUM: Aortic atherosclerosis. Aortic valve replacement noted. No acute abnormality of the cardiac and mediastinal silhouettes. BONES AND SOFT TISSUES: No acute osseous abnormality. IMPRESSION: 1. No acute cardiopulmonary process. 2. Multiple calcified granulomas in both lungs, unchanged. Electronically signed by: Toribio Agreste MD 02/16/2024 11:28 AM EST RP Workstation: [id number]     Signature  -   Lavada Stank M.D on  02/17/2024 at 8:56 AM   -  To page go to [email or web address]

## 2024-02-17 NOTE — Plan of Care (Signed)

## 2024-02-17 NOTE — Evaluation (Signed)
 Physical Therapy Evaluation Patient Details Name: [name] MRN: [id number] DOB: Jan 22, 1931 Today's Date: 02/17/2024  History of Present Illness  89 y.o. male with medical history significant of hypertension, hyperlipidemia, GERD, esophageal stricture, CKD 3, CAD status post stent, A-fib, status post TAVR, OSA, obesity, gout presenting with weakness.  Clinical Impression  Pt reports to [facility] following sliding out of bed, onto the floor, and reported onset of B LE weakness. Pt received lying in bed on 5L O2 via [place]. Pt denies dizziness and SOB throughout mobility. Pt completed bed mobility modI and is steady with seated balance. CGA required for initial STS and pt appears mildly unsteady with static stance. Balance improves with use of walker. Pt tolerates household distances ambulating with rolling walker, though does not use at baseline and has some difficulty managing. Pt weaned to room air with activity and remained on room air at end of session. No weakness or significant gait deficits noted in B LE. Pt would benefit from continued PT services focused on balance, strength, and ambulation trial with SPC to promote return to baseline.         If plan is discharge home, recommend the following: A little help with walking and/or transfers;A little help with bathing/dressing/bathroom   Can travel by private vehicle        Equipment Recommendations None recommended by PT (Pt has all necessary equipment at this time)  Recommendations for Other Services       Functional Status Assessment Patient has had a recent decline in their functional status and demonstrates the ability to make significant improvements in function in a reasonable and predictable amount of time.     Precautions / Restrictions Precautions Precautions: Fall Restrictions Weight Bearing Restrictions Per Provider Order: No      Mobility  Bed Mobility Overal bed mobility: Modified Independent (uses bed rails as  needed) Bed Mobility: Rolling, Sidelying to Sit Rolling: Modified independent (Device/Increase time) Sidelying to sit: Modified independent (Device/Increase time)       General bed mobility comments: pt requires cues to breathe while sitting up, pt noted to be holding breath and face turning bright red.    Transfers Overall transfer level: Needs assistance Equipment used: Rolling walker (2 wheels) Transfers: Sit to/from Stand Sit to Stand: Contact guard assist           General transfer comment: Initially CGA for STS, pt progresses to SBA with repetition, STS x5 completed throughout session.    Ambulation/Gait Ambulation/Gait assistance: Contact guard assist Gait Distance (Feet): 160 Feet Assistive device: Rolling walker (2 wheels) Gait Pattern/deviations: Step-through pattern, Trunk flexed, Narrow base of support, Decreased step length - right, Decreased step length - left   Gait velocity interpretation: 1.31 - 2.62 ft/sec, indicative of limited community ambulator   General Gait Details: Pt with some difficulty managing walker due to not using at baseline. Pt requesting to trial ambulation without device and tolerates short distance without LOB. Will benefit from trialing [facility] next session as this is what he uses at baseline. 116ft completed during session.  Stairs            Wheelchair Mobility     Tilt Bed    Modified Rankin (Stroke Patients Only)       Balance Overall balance assessment: Mild deficits observed, not formally tested Sitting-balance support: No upper extremity supported Sitting balance-Leahy Scale: Normal Sitting balance - Comments: No concerns.   Standing balance support: Bilateral upper extremity supported Standing balance-Leahy Scale: Good Standing  balance comment: Pt demonstrates good balance with walker, mildly unsteady with short bouts of ambulation without UE support but no LOB occurs. Increased difficulty with turns.                              Pertinent Vitals/Pain Pain Assessment Pain Assessment: No/denies pain    Home Living Family/patient expects to be discharged to:: Private residence Living Arrangements: Alone Available Help at Discharge: Family (Daughter lives down the street, son and another daughter are also local) Type of Home: House Home Access: Ramped entrance       Home Layout: One level Home Equipment: Grab bars - toilet;Electric scooter;Rolling Walker (2 wheels);Grab bars - tub/shower;Rollator (4 wheels);Shower seat;Standard Vannie;Wheelchair - manual;Cane - single point;Wheelchair - power Additional Comments: Pt has this equipment available but does not require on a daily basis, only utilizes [facility] for mobility daily. Occasionally utilizes shower chair.    Prior Function Prior Level of Function : Independent/Modified Independent;Driving             Mobility Comments: Recently gave up gardening due to difficulty getting on/off the ground. ADLs Comments: Completes feeding, dressing, bathing independently.     Extremity/Trunk Assessment   Upper Extremity Assessment Upper Extremity Assessment: Defer to OT evaluation    Lower Extremity Assessment Lower Extremity Assessment: Overall WFL for tasks assessed    Cervical / Trunk Assessment Cervical / Trunk Assessment: Kyphotic  Communication   Communication Communication: No apparent difficulties Factors Affecting Communication: Hearing impaired    Cognition Arousal: Alert Behavior During Therapy: WFL for tasks assessed/performed   PT - Cognitive impairments: No apparent impairments                         Following commands: Intact       Cueing Cueing Techniques: Verbal cues, Tactile cues, Visual cues     General Comments General comments (skin integrity, edema, etc.): BP: 142/77, HR: 92, Spo2: 95%. 5L O2 initially, weaned to room air. No significant skin abnormalities noted.    Exercises      Assessment/Plan    PT Assessment Patient needs continued PT services  PT Problem List Decreased activity tolerance;Decreased balance       PT Treatment Interventions DME instruction;Gait training;Therapeutic activities;Therapeutic exercise;Balance training    PT Goals (Current goals can be found in the Care Plan section)  Acute Rehab PT Goals Patient Stated Goal: Return home, walking independently with cane. PT Goal Formulation: With patient Time For Goal Achievement: 03/02/24 Potential to Achieve Goals: Good Additional Goals Additional Goal #1: Pt will demonstrate steady static stance for 30s without significant postural sway or LOB in order to demonstrate safety needed to return home.    Frequency Min 2X/week     Co-evaluation               AM-PAC PT 6 Clicks Mobility  Outcome Measure Help needed turning from your back to your side while in a flat bed without using bedrails?: None Help needed moving from lying on your back to sitting on the side of a flat bed without using bedrails?: None Help needed moving to and from a bed to a chair (including a wheelchair)?: A Little Help needed standing up from a chair using your arms (e.g., wheelchair or bedside chair)?: A Little Help needed to walk in hospital room?: A Little Help needed climbing 3-5 steps with a railing? : A Little 6  Click Score: 20    End of Session Equipment Utilized During Treatment: Oxygen Activity Tolerance: Patient tolerated treatment well Patient left: in chair;with call bell/phone within reach;with chair alarm set;with family/visitor present Nurse Communication: Mobility status;Precautions PT Visit Diagnosis: Unsteadiness on feet (R26.81);Other abnormalities of gait and mobility (R26.89)    Time: 9156-9079 PT Time Calculation (min) (ACUTE ONLY): 37 min   Charges:   PT Evaluation $PT Eval Moderate Complexity: 1 Mod PT Treatments $Gait Training: 8-22 mins PT General Charges $$ ACUTE PT  VISIT: 1 Visit         Sabra Morel PT, DPT  Ashlyn K Loring 02/17/2024, 12:27 PM

## 2024-02-17 NOTE — Consult Note (Signed)
 Cardiology Consultation  Patient ID: [name] MRN: [id number]; DOB: 02/27/1931  Admit date: 02/16/2024 Date of Consult: 02/17/2024  PCP:  Nathan Arlyss RAMAN, MD   [facility] Providers Cardiologist:  Nathan Fell, MD     Patient Profile: [name] is a 89 y.o. male with a hx of CAD s/p PCI/RCA 03/11/22, CKD stage IIIb, obesity (BMI 32), OSA on CPAP, HTN, HLD, persistent atrial fibrillation on Coumadin  and severe aortic stenosis s/p TAVR  who is being seen 02/17/2024 for the evaluation of A-Fib at the request of Dr. Dennise.  History of Present Illness: [name] has past medical history as listed above.  He presented to the [facility] emergency department after having generalized weakness, dizziness after 2 days of diarrhea.  Since being admitted to the medicine service he has been diagnosed with dehydration, hypotension, CT scan showed possible colitis versus diverticulitis.  During this admission he was noted to be tachypneic requiring increased oxygen while being assisted to the bedside commode on 02/16/2024.  He was started on IV amiodarone drip, given IV Lasix  40 mg x 1.  He has a past medical history of permanent atrial fibrillation.  He is typically on Lopressor  12.5 mg twice daily along with warfarin for anticoagulation. He also has a history of severe AS s/p TAVR January 2024 with Dr. Fell. He also has a history of CAD s/p PCI to proximal RCA December 2023.  He was admitted to the medicine service for treatment of sepsis, colitis vs diverticulitis.  Cardiology was asked to consult in the setting of A-fib.  After speaking with the patient, he agrees with the history as stated above. He reported that he had diarrhea for about two days before feeling extremely weak. Since being admitted he admits to feeling significantly better. He tells me that his main concerns are with his stomach and figuring out what caused him to get sick. He denies any active anginal symptoms.  He remains in rate controlled A-Fib.   Past Medical History:  Diagnosis Date   Arthritis    Bradycardia    a. nocturnal with pauses overnight on tele likely due to OSA 10/2015.   CKD (chronic kidney disease), stage III (HCC)    Coronary artery disease    a. prev nonobst. b. LHC 10/24/15: occlusion of small diffusely diseased OM2 s/p balloon angioplasty, mild nonobstructive disease of mLAD and mRCA, normal LVEDP   Diabetes mellitus without complication (HCC)    01/02/21 Pt's daughter states patient does not  have diabetes.  A1C 07/23/20 was 7.2, patientr had been on medication.   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 Fell & Dr. Maryjane   Severe aortic stenosis    Vertigo     Past Surgical History:  Procedure Laterality  Date   AMPUTATION Right 03/03/2019   Procedure: RIGHT 2ND TOE AMPUTATION;  Surgeon: Harden Jerona GAILS, MD;  Location: [facility] OR;  Service: Orthopedics;  Laterality: Right;   AMPUTATION Right 01/03/2021   Procedure: RIGHT 3RD TOE AMPUTATION;  Surgeon: Harden Jerona GAILS, MD;  Location: [facility] OR;  Service: Orthopedics;  Laterality: Right;   BALLOON DILATION N/A 10/22/2014   Procedure: BALLOON DILATION;  Surgeon: Lupita FORBES Commander, MD;  Location: [facility] ENDOSCOPY;  Service: Endoscopy;  Laterality: N/A;   CARDIAC CATHETERIZATION  07/2008   CARDIAC CATHETERIZATION N/A 10/24/2015   Procedure: Left Heart  Cath and Coronary Angiography;  Surgeon: Lonni Hanson, MD;  Location: [facility] INVASIVE CV LAB;  Service: Cardiovascular;  Laterality: N/A;   CARDIAC CATHETERIZATION N/A 10/24/2015   Procedure: Coronary Balloon Angioplasty;  Surgeon: Lonni Hanson, MD;  Location: [facility] INVASIVE CV LAB;  Service: Cardiovascular;  Laterality: N/A;   Carotid Dopplers  09/2009   no sig extracranial stenosis and vertebral arteries had antegrade flow   CATARACT EXTRACTION W/ INTRAOCULAR LENS  IMPLANT, BILATERAL Bilateral    CORONARY STENT INTERVENTION N/A 03/11/2022   Procedure: CORONARY STENT INTERVENTION;  Surgeon: Wonda Sharper, MD;  Location: [facility] INVASIVE CV LAB;  Service: Cardiovascular;  Laterality: N/A;   ESOPHAGOGASTRODUODENOSCOPY (EGD) WITH ESOPHAGEAL DILATION     I've had it stretched 3 times (10/23/2015)   ESOPHAGOGASTRODUODENOSCOPY (EGD) WITH ESOPHAGEAL DILATION  06/2011   /medical hx above  (09/09/2017)   ESOPHAGOGASTRODUODENOSCOPY (EGD) WITH PROPOFOL  N/A 10/22/2014   Procedure: ESOPHAGOGASTRODUODENOSCOPY (EGD) WITH PROPOFOL ;  Surgeon: Lupita FORBES Commander, MD;  Location: [facility]COPY;  Service: Endoscopy;  Laterality: N/A;   FINGER SURGERY Right 02/1964   cut little finger off; had it reattached   I & D EXTREMITY Right 09/10/2017   Procedure: RIGHT FOOT DEBRIDEMENT;  Surgeon: Harden Jerona GAILS, MD;  Location: [facility] OR;  Service: Orthopedics;  Laterality: Right;   INTRAOPERATIVE TRANSTHORACIC ECHOCARDIOGRAM N/A 04/14/2022   Procedure: INTRAOPERATIVE TRANSTHORACIC ECHOCARDIOGRAM;  Surgeon: Wonda Sharper, MD;  Location: [facility] INVASIVE CV LAB;  Service: Open Heart Surgery;  Laterality: N/A;   MRI of brain  09/2009   chronic microvascular ischemia   OSA Sleep Study     per Dr. Chalice   RIGHT/LEFT HEART CATH AND CORONARY ANGIOGRAPHY N/A 03/11/2022   Procedure: RIGHT/LEFT HEART CATH AND CORONARY ANGIOGRAPHY;  Surgeon: Wonda Sharper, MD;  Location: [facility] INVASIVE CV LAB;  Service: Cardiovascular;  Laterality: N/A;   SHOULDER  ARTHROSCOPY W/ ROTATOR CUFF REPAIR Right 2008   TRANSCATHETER AORTIC VALVE REPLACEMENT, TRANSFEMORAL Left 04/14/2022   Procedure: Transcatheter Aortic Valve Replacement, Transfemoral;  Surgeon: Wonda Sharper, MD;  Location: [facility] INVASIVE CV LAB;  Service: Open Heart Surgery;  Laterality: Left;    Home Medications:  Prior to Admission medications   Medication Sig Start Date End Date Taking? Authorizing Provider  acetaminophen  (TYLENOL ) 500 MG tablet Take 500 mg by mouth as needed for mild pain.   Yes [provider]  albuterol  (VENTOLIN  HFA) 108 (90 Base) MCG/ACT inhaler Inhale 1-2 puffs into the lungs every 6 (six) hours as needed for wheezing or shortness of breath. 02/03/23  Yes Donnajean Lynwood DEL, PA-C  allopurinol  (ZYLOPRIM ) 100 MG tablet TAKE 1 TABLET BY MOUTH ONCE DAILY 12/20/23  Yes Nathan Arlyss RAMAN, MD  aspirin  EC 81 MG tablet Take 1 tablet (81 mg total) by mouth daily. Swallow whole. 09/21/22  Yes Wonda Sharper, MD  colchicine  0.6 MG tablet TAKE 1 TABLET BY MOUTH TWICE A DAY IF NEEDED THEN 1 DAILY IF  NEEDED THEREAFTER FOR GOUT 12/24/23  Yes Nathan Arlyss RAMAN, MD  cyanocobalamin  (,VITAMIN B-12,) 1000 MCG/ML injection 1000mcg IM every 28 days Patient taking differently: Inject 1,000 mcg into the muscle every 30 (thirty) days. 09/13/20  Yes Nathan Arlyss RAMAN, MD  diltiazem  (CARDIZEM  CD) 180 MG 24 hr capsule Take 1 capsule (180 mg total) by mouth daily. Patient taking differently: Take 180 mg by mouth at bedtime. 11/09/23  Yes Cooper, Michael, MD  diphenhydrAMINE  HCl (BENADRYL  PO) Take 1 tablet by mouth as needed.   Yes [provider]  furosemide  (LASIX ) 20 MG tablet TAKE 1 TABLET BY MOUTH ONCE DAILY 01/18/24  Yes Wonda Sharper, MD  metoprolol  tartrate (LOPRESSOR ) 25 MG tablet Take 0.5 tablets (12.5 mg total) by mouth 2 (two) times daily. 01/20/24  Yes Wonda Sharper, MD  omeprazole  (PRILOSEC ) 40 MG capsule Take 1 capsule (40 mg total) by mouth daily. 02/23/23  Yes Nathan Arlyss RAMAN, MD  tamsulosin  (FLOMAX ) 0.4 MG CAPS capsule Take 1 capsule (0.4 mg total) by mouth daily. 09/28/23  Yes Nathan Arlyss RAMAN, MD  warfarin (COUMADIN ) 4 MG tablet TAKE 1 TO 1 AND 1/2 TABLETS BY MOUTH DAILY OR AS DIRECTED BY COUMADIN  CLINIC Patient taking differently: Take 4-5 mg by mouth See admin instructions. Take 1 tablet by mouth every day except Sunday and Thursdays. On those days take 1.5 tablets by mouth. 01/18/24  Yes Wonda Sharper, MD  cephALEXin  (KEFLEX ) 500 MG capsule Take 4 capsules by mouth 30-60 min prior to ALL dental procedures. Patient not taking: Reported on 02/16/2024 02/08/24   Maccia, Melissa D, RPH-CPP   Scheduled Meds:  allopurinol   100 mg Oral Daily   aspirin  EC  81 mg Oral Daily   cyanocobalamin   1,000 mcg Intramuscular Q30 days   fluticasone   2 spray Each Nare Daily   metoprolol  tartrate  12.5 mg Oral BID   pantoprazole   40 mg Oral Daily   sodium chloride  flush  3 mL Intravenous Q12H   tamsulosin   0.4 mg Oral Daily   warfarin  6 mg Oral ONCE-1600   Warfarin - Pharmacist Dosing Inpatient   Does not apply q1600   Continuous Infusions:  amiodarone 30 mg/hr (02/17/24 0651)   ceFEPime (MAXIPIME) IV 2 g (02/17/24 1032)   metronidazole 500 mg (02/17/24 0854)   PRN Meds: acetaminophen  **OR** acetaminophen , albuterol , ondansetron  **OR** ondansetron  (ZOFRAN ) IV, polyethylene glycol  Allergies:    Allergies  Allergen Reactions   Penicillins Shortness Of Breath and Swelling    Patient tolerated Rocephin  injection 2019  PATIENT HAS HAD A PCN REACTION WITH IMMEDIATE RASH, FACIAL/TONGUE/THROAT SWELLING, SOB, OR LIGHTHEADEDNESS WITH HYPOTENSION:  #  #  YES  #  Has patient had a PCN reaction causing severe rash involving mucus membranes or skin necrosis: NO Has patient had a PCN reaction that required hospitalization NO Has patient had a PCN reaction occurring within the last 10 years: NO If all of the above answers are NO, then may proceed with Cephalosporin use.     Atorvastatin  Other (See Comments)    No energy, just felt bad     Pravastatin  Other (See Comments)    myalgias   Atrovent  Nasal Spray [Ipratropium] Other (See Comments)    Dizzy with use   Hydrocodone  Itching and Other (See Comments)    swimmy headed after use   Nsaids Other (See Comments)    Due to renal function   Uloric  [Febuxostat ] Other (See Comments)    Would avoid, worsening gout sx with  use.    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  Tobacco Use   Smoking status: Former    Current packs/day: 0.00    Average packs/day: 0.1 packs/day for 4.0 years (0.5 ttl pk-yrs)    Types: Cigarettes    Start date: 03/16/1965    Quit date: 03/16/1969    Years since quitting: 54.9    Passive exposure: Never   Smokeless tobacco: Former   Tobacco comments:    chewed when I smoked; smoked 1 pack/week for about 4 years, quit 1971  Vaping Use   Vaping status: Never Used  Substance and Sexual Activity   Alcohol use: Not Currently    Alcohol/week: 0.0 standard drinks of alcohol    Comment: 09/09/2017  Quit all alcohol in 1970's   Drug use: Never   Sexual activity: Not on file  Other Topics Concern   Not on file  Social History Narrative   Lives in [place], married 1953, widowed after 62 years   Retired naval architect, still does aeronautical engineer.   He is not routinely exercising.   1-2 cups caffeine daily    Social Drivers of Health   Financial Resource Strain: Low Risk  (12/08/2023)   Overall Financial Resource Strain (CARDIA)    Difficulty of Paying  Living Expenses: Not hard at all  Food Insecurity: No Food Insecurity (02/17/2024)   Hunger Vital Sign    Worried About Running Out of Food in the Last Year: Never true    Ran Out of Food in the Last Year: Never true  Transportation Needs: No Transportation Needs (02/17/2024)   PRAPARE - Administrator, Civil Service (Medical): No    Lack of Transportation (Non-Medical): No  Physical Activity: Inactive (12/08/2023)   Exercise Vital Sign    Days of Exercise per Week: 0 days    Minutes of Exercise per Session: 0 min  Stress: No Stress Concern Present (12/08/2023)   [employer] of Occupational Health - Occupational Stress Questionnaire    Feeling of Stress: Not at all  Social Connections: Moderately Isolated (02/17/2024)   Social Connection and Isolation Panel    Frequency of Communication with Friends and Family: More than three times a week    Frequency of Social Gatherings with Friends and Family: Once a week    Attends Religious Services: Never    Database Administrator or Organizations: Yes    Attends Banker Meetings: Never    Marital Status: Widowed  Intimate Partner Violence: Not At Risk (02/17/2024)   Humiliation, Afraid, Rape, and Kick questionnaire    Fear of Current or Ex-Partner: No    Emotionally Abused: No    Physically Abused: No    Sexually Abused: No    Family History:   Family History  Problem Relation Age of Onset   Stomach cancer Mother        died in 73's    Hypertension Father        died  in his 70's pna likely dementia   Dementia Father    Pneumonia Father    Heart disease Brother        S/P CABG   Sleep apnea Brother    Sleep apnea Daughter    Hypertension Other    Prostate cancer Neg Hx    Colon cancer Neg Hx     ROS:  Please see the history of present illness.  All other ROS reviewed and negative.     Physical Exam/Data: Vitals:   02/17/24 0400 02/17/24 0800 02/17/24 1030 02/17/24 1155  BP: 123/67 116/75 (!) 142/77  110/63  Pulse: 91 89 92 96  Resp: (!) 21 20  20   Temp: 98.6 F (37 C) 98.3 F (36.8 C)  97.6 F (36.4 C)  TempSrc: Oral Oral  Oral  SpO2: 98% 96% 95% 92%  Weight:      Height:        Intake/Output Summary (Last 24 hours) at 02/17/2024 1547 Last data filed at 02/17/2024 0651 Gross per 24 hour  Intake 874.63 ml  Output 1400 ml  Net -525.37 ml      02/17/2024    1:29 AM 12/29/2023   10:25 AM 12/08/2023   11:29 AM  Last 3 Weights  Weight (lbs) 198 lb 6.6 oz 197 lb 195 lb  Weight (kg) 90 kg 89.359 kg 88.451 kg     Body mass index is 32.02 kg/m.  General:  in no acute distress HEENT: normal Vascular: Distal pulses 2+ bilaterally Cardiac:  normal S1, S2; iRRR; no murmur  Lungs:  clear to auscultation bilaterally Abd: bloated abdomen   Ext: mild LE edema Musculoskeletal:  No deformities Skin: warm and dry  Neuro:   no focal abnormalities noted Psych:  Normal affect   Telemetry:  Telemetry was personally reviewed and demonstrates:  rate controlled A-Fib, HR 80-90s  Relevant CV Studies:  Echocardiogram, 04/26/2023 Left ventricular ejection fraction, by estimation, is 60 to 65% . The left ventricle has normal function. The left ventricle has no regional wall motion abnormalities. Left ventricular diastolic parameters are indeterminate. Right ventricular systolic function is normal. The right ventricular size is normal. There is moderately elevated pulmonary artery systolic pressure. Left atrial size was moderately dilated.  Mean gradient in diastole 5 peak 19 mmHg MVA PT1/ 2 2. 3 cm2 at HR 81 bpm. The mitral valve is abnormal. Mild mitral valve regurgitation. Mild mitral stenosis. Severe mitral annular calcification.  Post TAVR with 23 mm Sapien 3 valve no PVL mean gradient 10 peak 19 mmHg DVI 0. 55 AVA 2. 4 cm2 . The aortic valve has been repaired/ replaced. Aortic valve regurgitation is not visualized. No aortic stenosis is present. There is a 23 mm Sapien prosthetic ( TAVR)  valve present in the aortic position. Procedure Date: 1/ 30/ 24.  The inferior vena cava is normal in size with greater than 50% respiratory variability, suggesting right atrial pressure of 3 mmHg.  R/LHC, 04/11/2021   Mid RCA lesion is 15% stenosed.   Ost 2nd Mrg to 2nd Mrg lesion is 50% stenosed.   2nd Mrg lesion is 90% stenosed.   Mid Cx lesion is 70% stenosed.   Prox RCA lesion is 90% stenosed.   Prox LAD to Mid LAD lesion is 50% stenosed.   A drug-eluting stent was successfully placed using a SYNERGY XD 3.0X24.   Post intervention, there is a 0% residual stenosis.   There is severe aortic valve stenosis.   Severe aortic stenosis with  mean transaortic gradient 42 mmHg, calculated aortic valve area 0.6 cm Two-vessel coronary artery disease with severe stenosis of the proximal RCA, treated with PCI using a 3.0 x 24 mm Synergy DES and severe stenosis of the OM branch of the circumflex with recommendations for medical therapy due to lesion complexity Moderate nonobstructive diffuse proximal to mid LAD stenosis estimated at 50%   Recommendations: Resume warfarin today, resume Lovenox  bridging tomorrow, clopidogrel  75 mg daily concurrent with warfarin anticoagulation for 6 months.  Will withhold aspirin  due to bleeding risk of triple therapy in this 89 year old gentleman.  Same-day PCI discharge protocol if criteria met.  Laboratory Data: High Sensitivity Troponin:   Recent Labs  Lab 02/16/24 1040 02/16/24 1310 02/16/24 1744  TROPONINIHS 9 12 14      Chemistry Recent Labs  Lab 02/16/24 1040 02/16/24 1049 02/16/24 1050 02/17/24 0357  NA 138 139 138 136  K 4.2 4.2 4.2 3.8  CL 99 98  --  101  CO2 27  --   --  26  GLUCOSE 138* 136*  --  115*  BUN 16 22  --  16  CREATININE 1.60* 1.60*  --  1.47*  CALCIUM  8.6*  --   --  8.1*  MG  --   --   --  1.8  GFRNONAA 40*  --   --  45*  ANIONGAP 12  --   --  9    Recent Labs  Lab 02/16/24 1040 02/17/24 0357  PROT 6.6 6.1*  ALBUMIN  3.3* 2.9*  AST 28 29  ALT 18 17  ALKPHOS 80 70  BILITOT 0.8 1.0   Lipids No results for input(s): CHOL, TRIG, HDL, LABVLDL, LDLCALC, CHOLHDL in the last 168 hours.  Hematology Recent Labs  Lab 02/16/24 1040 02/16/24 1049 02/16/24 1050 02/17/24 0357  WBC 14.9*  --   --  11.7*  RBC 5.53  --   --  5.26  HGB 15.7 16.0 16.0 15.0  HCT 47.4 47.0 47.0 44.3  MCV 85.7  --   --  84.2  MCH 28.4  --   --  28.5  MCHC 33.1  --   --  33.9  RDW 14.9  --   --  15.2  PLT 124*  --   --  112*   Thyroid   Recent Labs  Lab 02/17/24 0357  TSH 1.676  FREET4 0.80    BNP Recent Labs  Lab 02/16/24 1040  BNP 191.3*    DDimer No results for input(s): DDIMER in the last 168 hours.  Radiology/Studies:  DG Chest Port 1 View Result Date: 02/17/2024 CLINICAL DATA:  Shortness of breath EXAM: PORTABLE CHEST 1 VIEW COMPARISON:  Chest radiograph dated 02/16/2024 FINDINGS: Normal lung volumes. Increased diffuse interstitial opacities, right-greater-than-left. No pleural effusion or pneumothorax. The heart size and mediastinal contours are within normal limits. No acute osseous abnormality. IMPRESSION: Increased diffuse interstitial opacities, right-greater-than-left, which may represent pulmonary edema or atypical infection. Electronically Signed   By: Limin  Xu M.D.   On: 02/17/2024 07:51   DG CHEST PORT 1 VIEW Result Date: 02/16/2024 EXAM: 1 VIEW(S) XRAY OF THE CHEST 02/16/2024 05:43:00 PM COMPARISON: None available. CLINICAL HISTORY: Acute respiratory failure with hypoxia (HCC) [id number]. FINDINGS: LUNGS AND PLEURA: Mildly increased interstitial densities are noted bilaterally consistent with pulmonary edema. No pleural effusion. No pneumothorax. HEART AND MEDIASTINUM: Stable cardiomediastinal silhouette. BONES AND SOFT TISSUES: No acute osseous abnormality. IMPRESSION: 1. Mild interstitial pulmonary edema bilaterally. Electronically signed by: Lynwood Seip MD 02/16/2024  07:00 PM EST RP Workstation:  [id number]   CT CHEST ABDOMEN PELVIS WO CONTRAST Result Date: 02/16/2024 CLINICAL DATA:  Tried to get out of bed and slid to the floor. Nausea. Concern for sepsis. EXAM: CT CHEST, ABDOMEN AND PELVIS WITHOUT CONTRAST TECHNIQUE: Multidetector CT imaging of the chest, abdomen and pelvis was performed following the standard protocol without IV contrast. RADIATION DOSE REDUCTION: This exam was performed according to the departmental dose-optimization program which includes automated exposure control, adjustment of the mA and/or kV according to patient size and/or use of iterative reconstruction technique. COMPARISON:  CT chest dated 04/19/2023. CTA chest abdomen and pelvis dated 03/17/2022. FINDINGS: CT CHEST FINDINGS Cardiovascular: Normal heart size. No pericardial effusion. Status post TAVR. Nonaneurysmal thoracic aorta with atherosclerotic calcification extending into the origins of the arch branch vessels. Coronary artery calcifications. Dense mitral annulus calcification. Mediastinum/Nodes: No enlarged mediastinal or axillary lymph nodes. Calcified mediastinal and hilar granulomatous nodes. Trachea and esophagus are within normal limits. Lungs/Pleura: Lungs appear generally well aerated. Stable 4 mm nodule in the right upper lobe and additional adjacent smaller 2 mm nodule, which have demonstrated long-term stability and for which no further follow-up imaging is recommended. Scattered calcified granulomas. No focal consolidation. No pleural effusion or pneumothorax. Musculoskeletal: No acute osseous abnormality. Multilevel degenerative changes of the thoracic spine, advanced at T9-T10. CT ABDOMEN PELVIS FINDINGS Hepatobiliary: Liver is stable in size and morphology with similar contour surface irregularity, can not exclude cirrhosis. No suspicious focal hepatic lesions identified within the limits of an unenhanced exam. Punctate calcified granulomas. Gallbladder is unremarkable. No biliary dilatation. Pancreas:  Unremarkable. Spleen: Unremarkable. Adrenals/Urinary Tract: Adrenal glands are unremarkable. No urolithiasis or hydronephrosis. Bladder is unremarkable. Stomach/Bowel: Stomach is within normal limits. Small bowel is grossly unremarkable. Normal appendix. No obstruction. There is wall thickening and mild surrounding inflammatory stranding and haziness of the ascending colon extending to the level of the hepatic flexure, which could be secondary to a nonspecific colitis or possibly diverticulitis given the presence of right colonic diverticulosis. No evidence of perforation or abscess. Diverticulosis throughout the descending and sigmoid colon. Vascular/Lymphatic: Abdominal aorta is normal in caliber with atherosclerotic calcification. No enlarged abdominal or pelvic lymph nodes. Reproductive: Prostate is unremarkable. Other: No abdominopelvic ascites.  No intraperitoneal free air. Musculoskeletal: No acute osseous abnormality. Multilevel degenerative changes of the lumbar spine. IMPRESSION: 1. Wall thickening of the ascending colon with mild surrounding inflammatory changes extending to the level of the hepatic flexure, which could be secondary to a nonspecific colitis or possibly diverticulitis given the presence of right colonic diverticulosis. No evidence of perforation or abscess. 2. Diverticulosis of the descending and sigmoid colon. 3. Similar liver contour surface irregularity, can not exclude cirrhosis. No suspicious focal hepatic lesion identified within the limits of an unenhanced exam. 4.  Aortic Atherosclerosis (ICD10-I70.0). Electronically Signed   By: Harrietta Sherry M.D.   On: 02/16/2024 12:55   DG Elbow Complete Left Result Date: 02/16/2024 CLINICAL DATA:  Recent fall, pain EXAM: LEFT ELBOW - COMPLETE 3+ VIEW COMPARISON:  None Available. FINDINGS: Bones are osteopenic. Minor degenerative changes of the left elbow joint. Normal alignment without acute displaced fracture or large effusion. 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 Peripheral vascular calcifications noted. Overlying artifact from the peripheral IV present. IMPRESSION: 1. Osteopenia and degenerative changes. 2. No acute osseous finding by plain radiography. Electronically Signed   By: CHRISTELLA.  Shick M.D.   On: 02/16/2024 12:40  CT Hip Right Wo Contrast Result Date: 02/16/2024 CLINICAL DATA:  Tried to get out of bed and slid to the floor. EXAM: CT OF THE RIGHT HIP WITHOUT CONTRAST TECHNIQUE: Multidetector CT imaging of the right hip was performed according to the standard protocol. Multiplanar CT image reconstructions were also generated. RADIATION DOSE REDUCTION: This exam was performed according to the departmental dose-optimization program which includes automated exposure control, adjustment of the mA and/or kV according to patient size and/or use of iterative reconstruction technique. COMPARISON:  None Available. FINDINGS: Bones/Joint/Cartilage No acute fracture or dislocation. The right femoral head is seated within the acetabulum. Mild osteoarthritis of the right hip with joint space narrowing and small osteophytes. The visualized right sacroiliac joint and pubic symphysis are anatomically aligned. Ligaments Ligaments are suboptimally evaluated by CT. Muscles and Tendons Muscles are within normal limits. Soft tissue No fluid collection. No enlarged lymph nodes identified in the field of view. Visualized intrapelvic contents are grossly unremarkable. Peripheral vascular calcifications. IMPRESSION: 1. No acute osseous abnormality. 2. Mild osteoarthritis of the right hip. Electronically Signed   By: Harrietta Sherry M.D.   On: 02/16/2024 12:32   DG Pelvis 1-2 Views Result Date: 02/16/2024 EXAM: 1 or 2 VIEW(S) XRAY OF THE PELVIS 02/16/2024 10:53:00 AM COMPARISON: Prominent CT 03/17/2022. CLINICAL HISTORY: Fall. FINDINGS: BONES AND JOINTS: Possible acute fracture of  the right femoral neck with foreshortening. Degenerative changes of the visualized lower lumbar spine. Minimal symmetric osteoarthritic changes of the hips. Diffuse decreased bone mineralization. No joint dislocation. SOFT TISSUES: No focal abnormality. IMPRESSION: 1. Possible right femoral neck fracture. Recommend dedicated right hip series versus CT for further evaluation. 2. Diffuse decreased bone mineralization. 3. Minimal symmetric osteoarthritic changes of the hips. Electronically signed by: Toribio Agreste MD 02/16/2024 11:35 AM EST RP Workstation: [id number]   CT Head Wo Contrast Result Date: 02/16/2024 EXAM: CT HEAD WITHOUT CONTRAST 02/16/2024 11:10:21 AM TECHNIQUE: CT of the head was performed without the administration of intravenous contrast. Automated exposure control, iterative reconstruction, and/or weight based adjustment of the mA/kV was utilized to reduce the radiation dose to as low as reasonably achievable. COMPARISON: None available. CLINICAL HISTORY: Head trauma, minor (Age >= 65y) FINDINGS: BRAIN AND VENTRICLES: No acute hemorrhage. No evidence of acute infarct. No hydrocephalus. No extra-axial collection. No mass effect or midline shift. Patchy white matter hypodensities, compatible with chronic microvascular ischemic change. ORBITS: No acute abnormality. SINUSES: No acute abnormality. SOFT TISSUES AND SKULL: No acute soft tissue abnormality. No skull fracture. IMPRESSION: 1. No acute intracranial abnormality. Electronically signed by: Gilmore Molt MD 02/16/2024 11:30 AM EST RP Workstation: [id number]   DG Chest Port 1 View Result Date: 02/16/2024 EXAM: 1 VIEW(S) XRAY OF THE CHEST 02/16/2024 10:53:00 AM COMPARISON: 02/03/2023, chest CT 04/19/2023. CLINICAL HISTORY: fever FINDINGS: LUNGS AND PLEURA: Multiple calcified granulomas in lungs bilaterally, unchanged. No pleural effusion. No pneumothorax. HEART AND MEDIASTINUM: Aortic atherosclerosis. Aortic valve replacement noted. No acute  abnormality of the cardiac and mediastinal silhouettes. BONES AND SOFT TISSUES: No acute osseous abnormality. IMPRESSION: 1. No acute cardiopulmonary process. 2. Multiple calcified granulomas in both lungs, unchanged. Electronically signed by: Toribio Agreste MD 02/16/2024 11:28 AM EST RP Workstation: [id number]   Assessment and Plan:  Persistent atrial fibrillation RVR in the setting of sepsis, colitis, diverticulitis Home meds: Lopressor  12.5 mg twice daily, p.o. diltiazem  180 mg daily warfarin for anticoagulation Known history of A-fib Started on IV amiodarone this admission for rapid rates Most recent EKG shows a controlled A-fib with HR 87  Currently on Lopressor  12.5 mg BID Currently on IV amiodarone, dont suspect will need to continue this can likely stop Would add back home diltiazem  once BP can tolerate  Continue warfarin per pharmacy   Severe AS s/p TAVR 03/2022 Mild mitral regurgitation Mild mitral stenosis  Home meds: Lasix  20 mg daily  Echo 04/2023: LVEF 60-65%, no RWMA, normal RV systolic function, mild MR, mild MS, severe MAC, no PVL, no AS, normal IVC Follow closely as an outpatient Continue to monitor periodically with serial echocardiograms Considering adding back home PO Lasix  20 mg daily   CAD s/p DES to proximal RCA 02/2022 Home meds: ASA 81 mg daily Troponin negative x 3 No prior active chest pain EKG shows rate controlled A-fib, no acute ischemic changes Continue ASA 81 mg daily  Hypertension Home meds: Lopressor  12.5 mg twice daily, lisinopril  10 mg twice daily, Lasix  20 mg daily, diltiazem  180 mg daily BP on softer side this admission Currently on Lopressor  12.5 mg BID  Risk Assessment/Risk Scores:      CHA2DS2-VASc Score = 5   This indicates a 7.2% annual risk of stroke. The patient's score is based upon: CHF History: 0 HTN History: 1 Diabetes History: 1 Stroke History: 0 Vascular Disease History: 1 Age Score: 2 Gender Score: 0     For questions  or updates, please contact [facility] Please consult [email or web address] for contact info under   Signed, Waddell DELENA Donath, PA-C  02/17/2024 3:47 PM

## 2024-02-17 NOTE — Progress Notes (Signed)
 PHARMACY - ANTICOAGULATION CONSULT NOTE  Pharmacy Consult for warfarin Indication: atrial fibrillation  Allergies  Allergen Reactions   Penicillins Shortness Of Breath and Swelling    Patient tolerated Rocephin  injection 2019  PATIENT HAS HAD A PCN REACTION WITH IMMEDIATE RASH, FACIAL/TONGUE/THROAT SWELLING, SOB, OR LIGHTHEADEDNESS WITH HYPOTENSION:  #  #  YES  #  Has patient had a PCN reaction causing severe rash involving mucus membranes or skin necrosis: NO Has patient had a PCN reaction that required hospitalization NO Has patient had a PCN reaction occurring within the last 10 years: NO If all of the above answers are NO, then may proceed with Cephalosporin use.    Atorvastatin  Other (See Comments)    No energy, just felt bad     Pravastatin  Other (See Comments)    myalgias   Atrovent  Nasal Spray [Ipratropium] Other (See Comments)    Dizzy with use   Hydrocodone  Itching and Other (See Comments)    swimmy headed after use   Nsaids Other (See Comments)    Due to renal function   Uloric  [Febuxostat ] Other (See Comments)    Would avoid, worsening gout sx with use.    Vancomycin  Other (See Comments)    Red man syndrome- tolerated with slow run and benadryl  use   Clarithromycin Diarrhea   Doxycycline  Other (See Comments)    GI upset     Patient Measurements: Height: 5' 6 (167.6 cm) Weight: 90 kg (198 lb 6.6 oz) IBW/kg (Calculated) : 63.8 HEPARIN  DW (KG): 82.8  Vital Signs: Temp: 98.6 F (37 C) (12/04 0400) Temp Source: Oral (12/04 0400) BP: 123/67 (12/04 0400) Pulse Rate: 91 (12/04 0400)  Labs: Recent Labs    02/16/24 1040 02/16/24 1049 02/16/24 1050 02/16/24 1310 02/16/24 1744 02/17/24 0357  HGB 15.7 16.0 16.0  --   --  15.0  HCT 47.4 47.0 47.0  --   --  44.3  PLT 124*  --   --   --   --  112*  LABPROT 26.3*  --   --   --   --  22.6*  INR 2.3*  --   --   --   --  1.9*  CREATININE 1.60* 1.60*  --   --   --  1.47*  TROPONINIHS 9  --   --  12 14  --      Estimated Creatinine Clearance: 34.4 mL/min (A) (by C-G formula based on SCr of 1.47 mg/dL (H)).   Medical History: Past Medical History:  Diagnosis Date   Arthritis    Bradycardia    a. nocturnal with pauses overnight on tele likely due to OSA 10/2015.   CKD (chronic kidney disease), stage III (HCC)    Coronary artery disease    a. prev nonobst. b. LHC 10/24/15: occlusion of small diffusely diseased OM2 s/p balloon angioplasty, mild nonobstructive disease of mLAD and mRCA, normal LVEDP   Diabetes mellitus without complication (HCC)    01/02/21 Pt's daughter states patient does not  have diabetes.  A1C 07/23/20 was 7.2, patientr had been on medication.   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 Wonda & Dr. Maryjane   Severe aortic stenosis    Vertigo     Assessment: 39 YOM presenting with weakness, hx of afib on warfarin PTA with last dose 12/2, INR therapeutic on admission at 2.3, notable drug drug interactions include Flagyl  started this admission so will need to watch for INR uptrend   PTA dosing: 4mg  daily except 6mg  Sun/thur  12/4 AM update: INR 1.9- down from 12/3 Hgb wnl PLT 112K Coumadin  4 mg dose not given 12/3. Charted as not given by prior nurse   Goal of Therapy:  INR 2-3 Monitor platelets by anticoagulation protocol: Yes   Plan:  Warfarin 6 mg PO x 1 today Daily  INR, s/s bleeding   Saquan Furtick BS, PharmD, BCPS Clinical Pharmacist 02/17/2024 7:35 AM  Contact: [phone] after 3 PM

## 2024-02-17 NOTE — Evaluation (Signed)
 Occupational Therapy Evaluation Patient Details Name: [name] MRN: [id number] DOB: 08-Jul-1930 Today's Date: 02/17/2024   History of Present Illness   89 y.o. male with medical history significant of hypertension, hyperlipidemia, GERD, esophageal stricture, CKD 3, CAD status post stent, A-fib, status post TAVR, OSA, obesity, gout presenting with weakness.     Clinical Impressions Patient admitted for the diagnosis above.  PTA he lives alone, but has stronger family support.  Patient typically cares for his own self care, iADL, mobilizes without an AD, and continues to drive.  Patient with initial unsteadiness, but once standing, was closer to supervision.  OT will follow in the acute setting to address deficits, but no post acute OT is anticipated.       If plan is discharge home, recommend the following:   Assist for transportation     Functional Status Assessment   Patient has had a recent decline in their functional status and demonstrates the ability to make significant improvements in function in a reasonable and predictable amount of time.     Equipment Recommendations   None recommended by OT     Recommendations for Other Services         Precautions/Restrictions   Precautions Precautions: Fall Restrictions Weight Bearing Restrictions Per Provider Order: No     Mobility Bed Mobility Overal bed mobility: Needs Assistance Bed Mobility: Sit to Supine       Sit to supine: Modified independent (Device/Increase time)     Patient Response: Cooperative  Transfers Overall transfer level: Needs assistance   Transfers: Sit to/from Stand, Bed to chair/wheelchair/BSC Sit to Stand: Contact guard assist     Step pivot transfers: Supervision     General transfer comment: Initial LOB back with standing, once up, closer to supervision      Balance Overall balance assessment: Needs assistance Sitting-balance support: Feet supported Sitting  balance-Leahy Scale: Good     Standing balance support: Single extremity supported Standing balance-Leahy Scale: Fair                             ADL either performed or assessed with clinical judgement   ADL       Grooming: Supervision/safety;Standing               Lower Body Dressing: Contact guard assist;Sit to/from stand   Toilet Transfer: Contact guard Optician, Dispensing                   Vision Patient Visual Report: No change from baseline       Perception Perception: Not tested       Praxis Praxis: Not tested       Pertinent Vitals/Pain Pain Assessment Pain Assessment: No/denies pain     Extremity/Trunk Assessment Upper Extremity Assessment Upper Extremity Assessment: Overall WFL for tasks assessed   Lower Extremity Assessment Lower Extremity Assessment: Defer to PT evaluation   Cervical / Trunk Assessment Cervical / Trunk Assessment: Normal   Communication Communication Communication: No apparent difficulties   Cognition Arousal: Alert Behavior During Therapy: WFL for tasks assessed/performed Cognition: No apparent impairments                               Following commands: Intact       Cueing  General Comments   Cueing Techniques: Verbal cues      Exercises     Shoulder  Instructions      Home Living Family/patient expects to be discharged to:: Private residence Living Arrangements: Alone Available Help at Discharge: Family;Available 24 hours/day Type of Home: House Home Access: Ramped entrance     Home Layout: One level     Bathroom Shower/Tub: Walk-in shower;Tub/shower unit   Bathroom Toilet: Standard Bathroom Accessibility: Yes How Accessible: Accessible via walker Home Equipment: Shower Counsellor (2 wheels)   Additional Comments: Daughter lives close      Prior Functioning/Environment Prior Level of Function : Independent/Modified Independent;Driving                     OT Problem List: Decreased activity tolerance;Impaired balance (sitting and/or standing)   OT Treatment/Interventions: Self-care/ADL training;Therapeutic activities;Balance training      OT Goals(Current goals can be found in the care plan section)   Acute Rehab OT Goals Patient Stated Goal: Return home OT Goal Formulation: With patient Time For Goal Achievement: 03/02/24 Potential to Achieve Goals: Good ADL Goals Pt Will Perform Grooming: Independently;standing Pt Will Perform Lower Body Dressing: Independently;sit to/from stand Pt Will Transfer to Toilet: Independently;ambulating;regular height toilet   OT Frequency:  Min 2X/week    Co-evaluation              AM-PAC OT 6 Clicks Daily Activity     Outcome Measure Help from another person eating meals?: None Help from another person taking care of personal grooming?: A Little Help from another person toileting, which includes using toliet, bedpan, or urinal?: A Little Help from another person bathing (including washing, rinsing, drying)?: A Little Help from another person to put on and taking off regular upper body clothing?: None Help from another person to put on and taking off regular lower body clothing?: A Little 6 Click Score: 20   End of Session Equipment Utilized During Treatment: Gait belt Nurse Communication: Mobility status  Activity Tolerance: Patient tolerated treatment well Patient left: in bed;with call bell/phone within reach;with family/visitor present  OT Visit Diagnosis: Unsteadiness on feet (R26.81)                Time: 1010-1028 OT Time Calculation (min): 18 min Charges:  OT General Charges $OT Visit: 1 Visit OT Evaluation $OT Eval Moderate Complexity: 1 Mod  02/17/2024  RP, OTR/L  Acute Rehabilitation Services  Office:  [phone]   Charlie JONETTA Halsted 02/17/2024, 10:32 AM

## 2024-02-17 NOTE — TOC Initial Note (Signed)
 Transition of Care [facility]) - Initial/Assessment Note    Patient Details  Name: [name] MRN: [id number] Date of Birth: July 07, 1930  Transition of Care [facility]) CM/SW Contact:    Landry DELENA Senters, RN Phone Number: 02/17/2024, 11:50 AM  Clinical Narrative:                 [id number] history significant of hypertension, hyperlipidemia, GERD, esophageal stricture, CKD 3, CAD status post stent, A-fib, status post TAVR, OSA, obesity, gout presenting with weakness.   Patient lives alone, has a daughter that lives close, and several other family members for support and transportation needs. Patient does still drive, has PCP, manages own medications has DME at home, wears CPAP at night.   Currently waiting for therapy evals.  CM will continue to follow.   Expected Discharge Plan:  (TBD) Barriers to Discharge: Continued Medical Work up   Patient Goals and CMS Choice            Expected Discharge Plan and Services       Living arrangements for the past 2 months: Single Family Home                                      Prior Living Arrangements/Services Living arrangements for the past 2 months: Single Family Home Lives with:: Self Patient language and need for interpreter reviewed:: Yes Do you feel safe going back to the place where you live?: Yes      Need for Family Participation in Patient Care: Yes (Comment) Care giver support system in place?: Yes (comment) Current home services: DME (canes, rolling walker, tub bench, CPAP) Criminal Activity/Legal Involvement Pertinent to Current Situation/Hospitalization: No - Comment as needed  Activities of Daily Living   ADL Screening (condition at time of admission) Independently performs ADLs?: Yes (appropriate for developmental age) Is the patient deaf or have difficulty hearing?: Yes Does the patient have difficulty seeing, even when wearing glasses/contacts?: No Does the patient have difficulty concentrating, remembering,  or making decisions?: No  Permission Sought/Granted                  Emotional Assessment Appearance:: Developmentally appropriate Attitude/Demeanor/Rapport: Engaged Affect (typically observed): Calm Orientation: : Oriented to Self, Oriented to Place, Oriented to  Time, Oriented to Situation Alcohol / Substance Use: Not Applicable Psych Involvement: No (comment)  Admission diagnosis:  Weakness [R53.1] Sepsis (HCC) [A41.9] Fever, unspecified fever cause [R50.9] Patient Active Problem List   Diagnosis Date Noted   Sepsis (HCC) 02/16/2024   Acute respiratory failure with hypoxia (HCC) 02/16/2024   Cough 02/10/2023   Healthcare maintenance 09/13/2022   Advance care planning 09/13/2022   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   PND (post-nasal drip) 07/17/2021   Lower urinary tract symptoms (LUTS) 09/16/2020   Gouty arthritis of left foot 06/20/2020   Diabetic peripheral neuropathy (HCC) 10/04/2019   B12 deficiency 05/17/2019   Idiopathic chronic gout, unspecified site, without tophus (tophi) 10/06/2017   CAD S/P percutaneous coronary angioplasty    Chronic atrial fibrillation (HCC)    CKD (chronic kidney disease), stage III (HCC)    Hyperlipidemia    ETD (eustachian tube dysfunction) 05/24/2015   Chronic anticoagulation 09/25/2014   Esophageal stricture from GERD 06/08/2011   OSA on CPAP 09/22/2010   Gout 08/15/2008   OBESITY 08/15/2008   Essential hypertension 08/15/2008   Coronary artery disease with exertional angina 08/15/2008  GASTROESOPHAGEAL REFLUX DISEASE 08/15/2008   PCP:  [name], MD Pharmacy:   [facility] [phone] - [place], [place] - [address] AT [facility] [address] [place] [address] Phone: [phone] Fax: [phone]  [facility] [address] [place] [address] Phone: [phone] Fax: [phone]     Social Drivers of  Health (SDOH) Social History: SDOH Screenings   Food Insecurity: No Food Insecurity (12/08/2023)  Housing: Unknown (12/08/2023)  Transportation Needs: No Transportation Needs (12/08/2023)  Utilities: Not At Risk (12/08/2023)  Alcohol Screen: Low Risk  (12/08/2023)  Depression (PHQ2-9): Low Risk  (12/08/2023)  Financial Resource Strain: Low Risk  (12/08/2023)  Physical Activity: Inactive (12/08/2023)  Social Connections: Moderately Isolated (12/08/2023)  Stress: No Stress Concern Present (12/08/2023)  Tobacco Use: Medium Risk (02/16/2024)  Health Literacy: Adequate Health Literacy (12/08/2023)   SDOH Interventions:     Readmission Risk Interventions     No data to display

## 2024-02-18 DIAGNOSIS — I4819 Other persistent atrial fibrillation: Secondary | ICD-10-CM

## 2024-02-18 DIAGNOSIS — E785 Hyperlipidemia, unspecified: Secondary | ICD-10-CM

## 2024-02-18 DIAGNOSIS — R079 Chest pain, unspecified: Secondary | ICD-10-CM

## 2024-02-18 DIAGNOSIS — I2583 Coronary atherosclerosis due to lipid rich plaque: Secondary | ICD-10-CM

## 2024-02-18 DIAGNOSIS — I2510 Atherosclerotic heart disease of native coronary artery without angina pectoris: Secondary | ICD-10-CM

## 2024-02-18 DIAGNOSIS — I4891 Unspecified atrial fibrillation: Secondary | ICD-10-CM

## 2024-02-18 LAB — CBC WITH DIFFERENTIAL/PLATELET
Abs Immature Granulocytes: 0.04 K/uL (ref 0.00–0.07)
Basophils Absolute: 0 K/uL (ref 0.0–0.1)
Basophils Relative: 1 %
Eosinophils Absolute: 0 K/uL (ref 0.0–0.5)
Eosinophils Relative: 1 %
HCT: 41 % (ref 39.0–52.0)
Hemoglobin: 14 g/dL (ref 13.0–17.0)
Immature Granulocytes: 1 %
Lymphocytes Relative: 11 %
Lymphs Abs: 0.9 K/uL (ref 0.7–4.0)
MCH: 28.7 pg (ref 26.0–34.0)
MCHC: 34.1 g/dL (ref 30.0–36.0)
MCV: 84 fL (ref 80.0–100.0)
Monocytes Absolute: 0.7 K/uL (ref 0.1–1.0)
Monocytes Relative: 10 %
Neutro Abs: 6 K/uL (ref 1.7–7.7)
Neutrophils Relative %: 76 %
Platelets: 90 K/uL — ABNORMAL LOW (ref 150–400)
RBC: 4.88 MIL/uL (ref 4.22–5.81)
RDW: 15 % (ref 11.5–15.5)
Smear Review: NORMAL
WBC: 7.8 K/uL (ref 4.0–10.5)
nRBC: 0 % (ref 0.0–0.2)

## 2024-02-18 LAB — PHOSPHORUS: Phosphorus: 2.4 mg/dL — ABNORMAL LOW (ref 2.5–4.6)

## 2024-02-18 LAB — BASIC METABOLIC PANEL WITH GFR
Anion gap: 11 (ref 5–15)
BUN: 16 mg/dL (ref 8–23)
CO2: 25 mmol/L (ref 22–32)
Calcium: 8.3 mg/dL — ABNORMAL LOW (ref 8.9–10.3)
Chloride: 99 mmol/L (ref 98–111)
Creatinine, Ser: 1.33 mg/dL — ABNORMAL HIGH (ref 0.61–1.24)
GFR, Estimated: 50 mL/min — ABNORMAL LOW (ref >60)
Glucose, Bld: 110 mg/dL — ABNORMAL HIGH (ref 70–99)
Potassium: 3.7 mmol/L (ref 3.5–5.1)
Sodium: 135 mmol/L (ref 135–145)

## 2024-02-18 LAB — MAGNESIUM: Magnesium: 2 mg/dL (ref 1.7–2.4)

## 2024-02-18 LAB — PROTIME-INR
INR: 1.6 — ABNORMAL HIGH (ref 0.8–1.2)
Prothrombin Time: 19.6 s — ABNORMAL HIGH (ref 11.4–15.2)

## 2024-02-18 LAB — TROPONIN I (HIGH SENSITIVITY)
Troponin I (High Sensitivity): 13 ng/L (ref <18)
Troponin I (High Sensitivity): 15 ng/L (ref <18)

## 2024-02-18 MED ORDER — WARFARIN SODIUM 6 MG PO TABS
6.0000 mg | ORAL_TABLET | Freq: Once | ORAL | Status: AC
  Administered 2024-02-18: 6 mg via ORAL
  Filled 2024-02-18: qty 1

## 2024-02-18 MED ORDER — POTASSIUM PHOSPHATES 15 MMOLE/5ML IV SOLN
30.0000 mmol | Freq: Once | INTRAVENOUS | Status: AC
  Administered 2024-02-18: 30 mmol via INTRAVENOUS
  Filled 2024-02-18: qty 10

## 2024-02-18 NOTE — TOC Progression Note (Signed)
 Transition of Care [facility]) - Progression Note    Patient Details  Name: [name] MRN: [id number] Date of Birth: 05/25/1930  [facility] CM/SW Contact  Landry DELENA Senters, RN Phone Number: 02/18/2024, 12:52 PM  Clinical Narrative:    CM discussed HH with patient and son in the room. Per patient choice, referral sent to [facility] for therapy and has been accepted. Info on AVS.  CM will continue to follow.    Expected Discharge Plan:  (TBD) Barriers to Discharge: Continued Medical Work up               Expected Discharge Plan and Services       Living arrangements for the past 2 months: Single Family Home                                       Social Drivers of Health (SDOH) Interventions SDOH Screenings   Food Insecurity: No Food Insecurity (02/17/2024)  Housing: Unknown (02/17/2024)  Transportation Needs: No Transportation Needs (02/17/2024)  Utilities: Not At Risk (02/17/2024)  Alcohol Screen: Low Risk  (12/08/2023)  Depression (PHQ2-9): Low Risk  (12/08/2023)  Financial Resource Strain: Low Risk  (12/08/2023)  Physical Activity: Inactive (12/08/2023)  Social Connections: Moderately Isolated (02/17/2024)  Stress: No Stress Concern Present (12/08/2023)  Tobacco Use: Medium Risk (02/16/2024)  Health Literacy: Adequate Health Literacy (12/08/2023)    Readmission Risk Interventions     No data to display

## 2024-02-18 NOTE — Plan of Care (Signed)

## 2024-02-18 NOTE — Progress Notes (Addendum)
 PROGRESS NOTE     Patient Demographics:    [name], is a 89 y.o. male, DOB - 08/27/1930, FMW:[id number]  Outpatient Primary MD for the patient is [name], MD    LOS - 2  Admit date - 02/16/2024    Chief Complaint  Patient presents with   Weakness       Brief Narrative (HPI from H&P)    89 y.o. male with medical history significant of hypertension, hyperlipidemia, GERD, esophageal stricture, CKD 3, CAD status post stent, A-fib, status post TAVR, OSA, obesity, gout presenting with weakness.  Apparently he ate some dinner 2 days ago after which he developed profound diarrhea, subsequently developed generalized weakness and got dizzy upon standing up.  He also slid on the floor while getting out of the bed at home, came to the ER where he was diagnosed with dehydration, hypotension, CT suggestive of possible colitis versus diverticulitis, went briefly into A-fib RVR in the ER requiring amiodarone  drip.  Was admitted for further care   Subjective:   Patient in bed, appears comfortable, denies any headache, no fever, no chest pain or pressure, no shortness of breath , no abdominal pain. No focal weakness.  No further diarrhea, no chest pain or shortness of breath, no further syncope.   Assessment  & Plan :   Sepsis Colitis versus diverticulitis > Patient presenting with weakness, nausea, diarrhea after having some dinner.  No other family members got sick, no other sick contacts, has been treated for infectious colitis/diverticulitis with antibiotics, CT noted, overall much improved diarrhea has improved.  Monitor on soft diet, continue antibiotics, has been hydrated.  Monitor cultures.  Clinically appears to be  improving, diarrhea has completely resolved.  Weakness > Unable to stand today.  Likely in the setting of above.  At risk for significant deconditioning at 89 years old. - PT/OT eval and treat, family willing to take him home in 1 to 2 days with home PT.   Paroxysmal atrial fibrillation, Chad vasc 2 score of greater than 3, history of TAVR.  On beta-blocker and Coumadin  continued, went  into RVR in the ER likely due to dehydration from #1 above, required amiodarone  initially in the ER now on beta-blocker.  Cardiology following, stable TSH  Hypertension - Blood pressure normalizing will resume Lasix  on 02/19/2024, continue beta-blocker.   Hypophosphatemia.  Replaced.    Hyperlipidemia - Not currently on medication for this, med rec pending   GERD > History of related esophageal stricture - Continue home PPI   CAD > Status post stent - Continue home aspirin , metoprolol , warfarin   OSA - Continue home CPAP nightly.   CKD 3 > Creatinine mildly elevated but near baseline. - IV fluids as above - Trend renal function and electrolytes   Obesity - Noted   Gout - Continue allopurinol       Condition - Extremely Guarded  Family Communication  : Daughter bedside on 02/17/2024, 02/18/2024, send bedside 02/17/2024  Code Status : Full code  Consults  : Cardiology  PUD Prophylaxis : PPI   Procedures  :     CT head, CT right hip.  Nonacute.  CT chest abdomen pelvis. 1. Wall thickening of the ascending colon with mild surrounding inflammatory changes extending to the level of the hepatic flexure, which could be secondary to a nonspecific colitis or possibly diverticulitis given the presence of right colonic diverticulosis. No evidence of perforation or abscess. 2. Diverticulosis of the descending and sigmoid colon. 3. Similar liver contour surface irregularity, can not exclude cirrhosis. No suspicious focal hepatic lesion identified within the limits of an unenhanced exam. 4.  Aortic  Atherosclerosis (ICD10-I70.0).       Disposition Plan  :    Status is: Inpatient   DVT Prophylaxis  :     warfarin (COUMADIN ) tablet 6 mg   Lab Results  Component Value Date   INR 1.6 (H) 02/18/2024   INR 1.9 (H) 02/17/2024   INR 2.3 (H) 02/16/2024     Lab Results  Component Value Date   PLT 90 (L) 02/18/2024    Diet :  Diet Order             DIET SOFT Fluid consistency: Thin  Diet effective now                    Inpatient Medications  Scheduled Meds:  allopurinol   100 mg Oral Daily   aspirin  EC  81 mg Oral Daily   cyanocobalamin   1,000 mcg Intramuscular Q30 days   fluticasone   2 spray Each Nare Daily   metoprolol  tartrate  25 mg Oral BID   pantoprazole   40 mg Oral Daily   sodium chloride  flush  3 mL Intravenous Q12H   tamsulosin   0.4 mg Oral Daily   warfarin  6 mg Oral ONCE-1600   Warfarin - Pharmacist Dosing Inpatient   Does not apply q1600   Continuous Infusions:  ceFEPime  (MAXIPIME ) IV Stopped (02/17/24 2322)   metronidazole  Stopped (02/17/24 2205)   potassium PHOSPHATE  IVPB (in mmol) 85 mL/hr at 02/18/24 0632   PRN Meds:.acetaminophen  **OR** acetaminophen , albuterol , ondansetron  **OR** ondansetron  (ZOFRAN ) IV, polyethylene glycol     Objective:   Vitals:   02/17/24 2014 02/18/24 0013 02/18/24 0400 02/18/24 0734  BP: 122/72 (!) 109/94 (!) 113/49 135/77  Pulse:  93 89   Resp: 20 20 19    Temp: 99.4 F (37.4 C) 98.9 F (37.2 C) 98.7 F (37.1 C) 98.4 F (36.9 C)  TempSrc: Oral Oral Oral Oral  SpO2: 91% 94% 94%   Weight:      Height:  Wt Readings from Last 3 Encounters:  02/17/24 90 kg  12/29/23 89.4 kg  12/08/23 88.5 kg     Intake/Output Summary (Last 24 hours) at 02/18/2024 0809 Last data filed at 02/18/2024 0748 Gross per 24 hour  Intake 604.74 ml  Output 1600 ml  Net -995.26 ml     Physical Exam  Awake Alert, No new F.N deficits, Normal affect Rennert.AT,PERRAL Supple Neck, No JVD,   Symmetrical Chest wall  movement, Good air movement bilaterally, CTAB RRR,No Gallops,Rubs or new Murmurs,  +ve B.Sounds, Abd Soft, No tenderness,   No Cyanosis, Clubbing or edema        Data Review:    Recent Labs  Lab 02/16/24 1040 02/16/24 1049 02/16/24 1050 02/17/24 0357 02/18/24 0348  WBC 14.9*  --   --  11.7* 7.8  HGB 15.7 16.0 16.0 15.0 14.0  HCT 47.4 47.0 47.0 44.3 41.0  PLT 124*  --   --  112* 90*  MCV 85.7  --   --  84.2 84.0  MCH 28.4  --   --  28.5 28.7  MCHC 33.1  --   --  33.9 34.1  RDW 14.9  --   --  15.2 15.0  LYMPHSABS 0.5*  --   --   --  0.9  MONOABS 0.9  --   --   --  0.7  EOSABS 0.0  --   --   --  0.0  BASOSABS 0.0  --   --   --  0.0    Recent Labs  Lab 02/16/24 1040 02/16/24 1049 02/16/24 1050 02/16/24 1318 02/17/24 0357 02/18/24 0348  NA 138 139 138  --  136 135  K 4.2 4.2 4.2  --  3.8 3.7  CL 99 98  --   --  101 99  CO2 27  --   --   --  26 25  ANIONGAP 12  --   --   --  9 11  GLUCOSE 138* 136*  --   --  115* 110*  BUN 16 22  --   --  16 16  CREATININE 1.60* 1.60*  --   --  1.47* 1.33*  AST 28  --   --   --  29  --   ALT 18  --   --   --  17  --   ALKPHOS 80  --   --   --  70  --   BILITOT 0.8  --   --   --  1.0  --   ALBUMIN 3.3*  --   --   --  2.9*  --   LATICACIDVEN  --   --  3.5* 2.5*  --   --   INR 2.3*  --   --   --  1.9* 1.6*  TSH  --   --   --   --  1.676  --   BNP 191.3*  --   --   --   --   --   MG  --   --   --   --  1.8 2.0  PHOS  --   --   --   --   --  2.4*  CALCIUM  8.6*  --   --   --  8.1* 8.3*      Recent Labs  Lab 02/16/24 1040 02/16/24 1050 02/16/24 1318 02/17/24 0357 02/18/24 0348  LATICACIDVEN  --  3.5* 2.5*  --   --  INR 2.3*  --   --  1.9* 1.6*  TSH  --   --   --  1.676  --   BNP 191.3*  --   --   --   --   MG  --   --   --  1.8 2.0  CALCIUM  8.6*  --   --  8.1* 8.3*    --------------------------------------------------------------------------------------------------------------- Lab Results  Component Value Date    CHOL 187 09/28/2023   HDL 26.00 (L) 09/28/2023   LDLCALC 120 (H) 09/28/2023   LDLDIRECT 95.0 05/16/2019   TRIG 205.0 (H) 09/28/2023   CHOLHDL 7 09/28/2023    Lab Results  Component Value Date   HGBA1C 6.8 (H) 09/28/2023   Recent Labs    02/17/24 0357  TSH 1.676  FREET4 0.80   No results for input(s): VITAMINB12, FOLATE, FERRITIN, TIBC, IRON, RETICCTPCT in the last 72 hours. ------------------------------------------------------------------------------------------------------------------ Cardiac Enzymes No results for input(s): CKMB, TROPONINI, MYOGLOBIN in the last 168 hours.  Invalid input(s): CK  Micro Results Recent Results (from the past 240 hours)  Resp panel by RT-PCR (RSV, Flu A&B, Covid) Anterior Nasal Swab     Status: None   Collection Time: 02/16/24 10:25 AM   Specimen: Anterior Nasal Swab  Result Value Ref Range Status   SARS Coronavirus 2 by RT PCR NEGATIVE NEGATIVE Final   Influenza A by PCR NEGATIVE NEGATIVE Final   Influenza B by PCR NEGATIVE NEGATIVE Final    Comment: (NOTE) The Xpert Xpress SARS-CoV-2/FLU/RSV plus assay is intended as an aid in the diagnosis of influenza from Nasopharyngeal swab specimens and should not be used as a sole basis for treatment. Nasal washings and aspirates are unacceptable for Xpert Xpress SARS-CoV-2/FLU/RSV testing.  Fact Sheet for Patients: bloggercourse.com  Fact Sheet for Healthcare Providers: seriousbroker.it  This test is not yet approved or cleared by the [place]  FDA and has been authorized for detection and/or diagnosis of SARS-CoV-2 by FDA under an Emergency Use Authorization (EUA). This EUA will remain in effect (meaning this test can be used) for the duration of the COVID-19 declaration under Section 564(b)(1) of the Act, 21 U.S.C. section 360bbb-3(b)(1), unless the authorization is terminated or revoked.     Resp Syncytial Virus  by PCR NEGATIVE NEGATIVE Final    Comment: (NOTE) Fact Sheet for Patients: bloggercourse.com  Fact Sheet for Healthcare Providers: seriousbroker.it  This test is not yet approved or cleared by the [place]  FDA and has been authorized for detection and/or diagnosis of SARS-CoV-2 by FDA under an Emergency Use Authorization (EUA). This EUA will remain in effect (meaning this test can be used) for the duration of the COVID-19 declaration under Section 564(b)(1) of the Act, 21 U.S.C. section 360bbb-3(b)(1), unless the authorization is terminated or revoked.  Performed at [facility], [address], [place], [place] [address]   Culture, blood (routine x 2)     Status: None (Preliminary result)   Collection Time: 02/16/24 10:26 AM   Specimen: BLOOD  Result Value Ref Range Status   Specimen Description BLOOD SITE NOT SPECIFIED  Final   Special Requests   Final    BOTTLES DRAWN AEROBIC ONLY Blood Culture results may not be optimal due to an inadequate volume of blood received in culture bottles   Culture   Final    NO GROWTH < 24 HOURS Performed at [facility], [address] [place], [place] [address]    Report Status PENDING  Incomplete  MRSA Next Gen by PCR, Nasal     Status: None   Collection Time: 02/17/24  1:39 AM   Specimen: Nasal Mucosa; Nasal Swab  Result Value Ref Range Status   MRSA by PCR Next Gen NOT DETECTED NOT DETECTED Final    Comment: (NOTE) The GeneXpert MRSA Assay (FDA approved for NASAL specimens only), is one component of a comprehensive MRSA colonization surveillance program. It is not intended to diagnose MRSA infection nor to guide or monitor treatment for MRSA infections. Test performance is not FDA approved in patients less than 26 years old. Performed at [facility], [address] [place], [place] [address]   Culture, blood (routine x 2)     Status: None (Preliminary result)    Collection Time: 02/17/24  3:57 AM   Specimen: BLOOD  Result Value Ref Range Status   Specimen Description BLOOD SITE NOT SPECIFIED  Final   Special Requests   Final    BOTTLES DRAWN AEROBIC AND ANAEROBIC Blood Culture adequate volume   Culture   Final    NO GROWTH < 12 HOURS Performed at [facility], [address] [place], [place] [address]    Report Status PENDING  Incomplete    Radiology Report DG Chest Port 1 View Result Date: 02/17/2024 CLINICAL DATA:  Shortness of breath EXAM: PORTABLE CHEST 1 VIEW COMPARISON:  Chest radiograph dated 02/16/2024 FINDINGS: Normal lung volumes. Increased diffuse interstitial opacities, right-greater-than-left. No pleural effusion or pneumothorax. The heart size and mediastinal contours are within normal limits. No acute osseous abnormality. IMPRESSION: Increased diffuse interstitial opacities, right-greater-than-left, which may represent pulmonary edema or atypical infection. Electronically Signed   By: Limin  Xu M.D.   On: 02/17/2024 07:51   DG CHEST PORT 1 VIEW Result Date: 02/16/2024 EXAM: 1 VIEW(S) XRAY OF THE CHEST 02/16/2024 05:43:00 PM COMPARISON: None available. CLINICAL HISTORY: Acute respiratory failure with hypoxia (HCC) [id number]. FINDINGS: LUNGS AND PLEURA: Mildly increased interstitial densities are noted bilaterally consistent with pulmonary edema. No pleural effusion. No pneumothorax. HEART AND MEDIASTINUM: Stable cardiomediastinal silhouette. BONES AND SOFT TISSUES: No acute osseous abnormality. IMPRESSION: 1. Mild interstitial pulmonary edema bilaterally. Electronically signed by: Lynwood Seip MD 02/16/2024 07:00 PM EST RP Workstation: [id number]   CT CHEST ABDOMEN PELVIS WO CONTRAST Result Date: 02/16/2024 CLINICAL DATA:  Tried to get out of bed and slid to the floor. Nausea. Concern for sepsis. EXAM: CT CHEST, ABDOMEN AND PELVIS WITHOUT CONTRAST TECHNIQUE: Multidetector CT imaging of the chest, abdomen and pelvis was performed  following the standard protocol without IV contrast. RADIATION DOSE REDUCTION: This exam was performed according to the departmental dose-optimization program which includes automated exposure control, adjustment of the mA and/or kV according to patient size and/or use of iterative reconstruction technique. COMPARISON:  CT chest dated 04/19/2023. CTA chest abdomen and pelvis dated 03/17/2022. FINDINGS: CT CHEST FINDINGS Cardiovascular: Normal heart size. No pericardial effusion. Status post TAVR. Nonaneurysmal thoracic aorta with atherosclerotic calcification extending into the origins of the arch branch vessels. Coronary artery calcifications. Dense mitral annulus calcification. Mediastinum/Nodes: No enlarged mediastinal or axillary lymph nodes. Calcified mediastinal and hilar granulomatous nodes. Trachea and esophagus are within normal limits. Lungs/Pleura: Lungs appear generally well aerated. Stable 4 mm nodule in the right upper lobe and additional adjacent smaller 2 mm nodule, which have demonstrated long-term stability and for which no further follow-up imaging is recommended. Scattered calcified granulomas. No focal consolidation. No pleural effusion or pneumothorax. Musculoskeletal: No acute osseous abnormality. Multilevel degenerative changes of  the thoracic spine, advanced at T9-T10. CT ABDOMEN PELVIS FINDINGS Hepatobiliary: Liver is stable in size and morphology with similar contour surface irregularity, can not exclude cirrhosis. No suspicious focal hepatic lesions identified within the limits of an unenhanced exam. Punctate calcified granulomas. Gallbladder is unremarkable. No biliary dilatation. Pancreas: Unremarkable. Spleen: Unremarkable. Adrenals/Urinary Tract: Adrenal glands are unremarkable. No urolithiasis or hydronephrosis. Bladder is unremarkable. Stomach/Bowel: Stomach is within normal limits. Small bowel is grossly unremarkable. Normal appendix. No obstruction. There is wall thickening and  mild surrounding inflammatory stranding and haziness of the ascending colon extending to the level of the hepatic flexure, which could be secondary to a nonspecific colitis or possibly diverticulitis given the presence of right colonic diverticulosis. No evidence of perforation or abscess. Diverticulosis throughout the descending and sigmoid colon. Vascular/Lymphatic: Abdominal aorta is normal in caliber with atherosclerotic calcification. No enlarged abdominal or pelvic lymph nodes. Reproductive: Prostate is unremarkable. Other: No abdominopelvic ascites.  No intraperitoneal free air. Musculoskeletal: No acute osseous abnormality. Multilevel degenerative changes of the lumbar spine. IMPRESSION: 1. Wall thickening of the ascending colon with mild surrounding inflammatory changes extending to the level of the hepatic flexure, which could be secondary to a nonspecific colitis or possibly diverticulitis given the presence of right colonic diverticulosis. No evidence of perforation or abscess. 2. Diverticulosis of the descending and sigmoid colon. 3. Similar liver contour surface irregularity, can not exclude cirrhosis. No suspicious focal hepatic lesion identified within the limits of an unenhanced exam. 4.  Aortic Atherosclerosis (ICD10-I70.0). Electronically Signed   By: Harrietta Sherry M.D.   On: 02/16/2024 12:55   DG Elbow Complete Left Result Date: 02/16/2024 CLINICAL DATA:  Recent fall, pain EXAM: LEFT ELBOW - COMPLETE 3+ VIEW COMPARISON:  None Available. FINDINGS: Bones are osteopenic. Minor degenerative changes of the left elbow joint. Normal alignment without acute displaced fracture or large effusion. 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 Peripheral vascular calcifications noted. Overlying artifact from the peripheral IV present. IMPRESSION: 1. Osteopenia and degenerative changes. 2. No acute osseous finding by plain  radiography. Electronically Signed   By: CHRISTELLA.  Shick M.D.   On: 02/16/2024 12:40   CT Hip Right Wo Contrast Result Date: 02/16/2024 CLINICAL DATA:  Tried to get out of bed and slid to the floor. EXAM: CT OF THE RIGHT HIP WITHOUT CONTRAST TECHNIQUE: Multidetector CT imaging of the right hip was performed according to the standard protocol. Multiplanar CT image reconstructions were also generated. RADIATION DOSE REDUCTION: This exam was performed according to the departmental dose-optimization program which includes automated exposure control, adjustment of the mA and/or kV according to patient size and/or use of iterative reconstruction technique. COMPARISON:  None Available. FINDINGS: Bones/Joint/Cartilage No acute fracture or dislocation. The right femoral head is seated within the acetabulum. Mild osteoarthritis of the right hip with joint space narrowing and small osteophytes. The visualized right sacroiliac joint and pubic symphysis are anatomically aligned. Ligaments Ligaments are suboptimally evaluated by CT. Muscles and Tendons Muscles are within normal limits. Soft tissue No fluid collection. No enlarged lymph nodes identified in the field of view. Visualized intrapelvic contents are grossly unremarkable. Peripheral vascular calcifications. IMPRESSION: 1. No acute osseous abnormality. 2. Mild osteoarthritis of the right hip. Electronically Signed   By: Harrietta Sherry M.D.   On: 02/16/2024 12:32   DG Pelvis 1-2 Views Result Date: 02/16/2024 EXAM: 1 or 2 VIEW(S) XRAY OF THE PELVIS 02/16/2024 10:53:00 AM COMPARISON: Prominent CT 03/17/2022. CLINICAL HISTORY:  Fall. FINDINGS: BONES AND JOINTS: Possible acute fracture of the right femoral neck with foreshortening. Degenerative changes of the visualized lower lumbar spine. Minimal symmetric osteoarthritic changes of the hips. Diffuse decreased bone mineralization. No joint dislocation. SOFT TISSUES: No focal abnormality. IMPRESSION: 1. Possible right femoral  neck fracture. Recommend dedicated right hip series versus CT for further evaluation. 2. Diffuse decreased bone mineralization. 3. Minimal symmetric osteoarthritic changes of the hips. Electronically signed by: Toribio Agreste MD 02/16/2024 11:35 AM EST RP Workstation: [id number]   CT Head Wo Contrast Result Date: 02/16/2024 EXAM: CT HEAD WITHOUT CONTRAST 02/16/2024 11:10:21 AM TECHNIQUE: CT of the head was performed without the administration of intravenous contrast. Automated exposure control, iterative reconstruction, and/or weight based adjustment of the mA/kV was utilized to reduce the radiation dose to as low as reasonably achievable. COMPARISON: None available. CLINICAL HISTORY: Head trauma, minor (Age >= 65y) FINDINGS: BRAIN AND VENTRICLES: No acute hemorrhage. No evidence of acute infarct. No hydrocephalus. No extra-axial collection. No mass effect or midline shift. Patchy white matter hypodensities, compatible with chronic microvascular ischemic change. ORBITS: No acute abnormality. SINUSES: No acute abnormality. SOFT TISSUES AND SKULL: No acute soft tissue abnormality. No skull fracture. IMPRESSION: 1. No acute intracranial abnormality. Electronically signed by: Gilmore Molt MD 02/16/2024 11:30 AM EST RP Workstation: [id number]   DG Chest Port 1 View Result Date: 02/16/2024 EXAM: 1 VIEW(S) XRAY OF THE CHEST 02/16/2024 10:53:00 AM COMPARISON: 02/03/2023, chest CT 04/19/2023. CLINICAL HISTORY: fever FINDINGS: LUNGS AND PLEURA: Multiple calcified granulomas in lungs bilaterally, unchanged. No pleural effusion. No pneumothorax. HEART AND MEDIASTINUM: Aortic atherosclerosis. Aortic valve replacement noted. No acute abnormality of the cardiac and mediastinal silhouettes. BONES AND SOFT TISSUES: No acute osseous abnormality. IMPRESSION: 1. No acute cardiopulmonary process. 2. Multiple calcified granulomas in both lungs, unchanged. Electronically signed by: Toribio Agreste MD 02/16/2024 11:28 AM EST RP  Workstation: [id number]     Signature  -   Lavada Stank M.D on 02/18/2024 at 8:09 AM   -  To page go to [email or web address]

## 2024-02-18 NOTE — Progress Notes (Signed)
 PHARMACY - ANTICOAGULATION CONSULT NOTE  Pharmacy Consult for warfarin Indication: atrial fibrillation  Allergies  Allergen Reactions   Penicillins Shortness Of Breath and Swelling    Patient tolerated Rocephin  injection 2019  PATIENT HAS HAD A PCN REACTION WITH IMMEDIATE RASH, FACIAL/TONGUE/THROAT SWELLING, SOB, OR LIGHTHEADEDNESS WITH HYPOTENSION:  #  #  YES  #  Has patient had a PCN reaction causing severe rash involving mucus membranes or skin necrosis: NO Has patient had a PCN reaction that required hospitalization NO Has patient had a PCN reaction occurring within the last 10 years: NO If all of the above answers are NO, then may proceed with Cephalosporin use.    Atorvastatin  Other (See Comments)    No energy, just felt bad     Pravastatin  Other (See Comments)    myalgias   Atrovent  Nasal Spray [Ipratropium] Other (See Comments)    Dizzy with use   Hydrocodone  Itching and Other (See Comments)    swimmy headed after use   Nsaids Other (See Comments)    Due to renal function   Uloric  [Febuxostat ] Other (See Comments)    Would avoid, worsening gout sx with use.    Vancomycin  Other (See Comments)    Red man syndrome- tolerated with slow run and benadryl  use   Clarithromycin Diarrhea   Doxycycline  Other (See Comments)    GI upset     Patient Measurements: Height: 5' 6 (167.6 cm) Weight: 90 kg (198 lb 6.6 oz) IBW/kg (Calculated) : 63.8 HEPARIN  DW (KG): 82.8  Vital Signs: Temp: 98.4 F (36.9 C) (12/05 0734) Temp Source: Oral (12/05 0734) BP: 135/77 (12/05 0734) Pulse Rate: 89 (12/05 0400)  Labs: Recent Labs    02/16/24 1040 02/16/24 1049 02/16/24 1050 02/16/24 1310 02/16/24 1744 02/17/24 0357 02/18/24 0348  HGB 15.7 16.0 16.0  --   --  15.0 14.0  HCT 47.4 47.0 47.0  --   --  44.3 41.0  PLT 124*  --   --   --   --  112* 90*  LABPROT 26.3*  --   --   --   --  22.6* 19.6*  INR 2.3*  --   --   --   --  1.9* 1.6*  CREATININE 1.60* 1.60*  --   --   --   1.47* 1.33*  TROPONINIHS 9  --   --  12 14  --   --     Estimated Creatinine Clearance: 38 mL/min (A) (by C-G formula based on SCr of 1.33 mg/dL (H)).   Medical History: Past Medical History:  Diagnosis Date   Arthritis    Bradycardia    a. nocturnal with pauses overnight on tele likely due to OSA 10/2015.   CKD (chronic kidney disease), stage III (HCC)    Coronary artery disease    a. prev nonobst. b. LHC 10/24/15: occlusion of small diffusely diseased OM2 s/p balloon angioplasty, mild nonobstructive disease of mLAD and mRCA, normal LVEDP   Diabetes mellitus without complication (HCC)    01/02/21 Pt's daughter states patient does not  have diabetes.  A1C 07/23/20 was 7.2, patientr had been on medication.   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 Wonda & Dr. Maryjane   Severe aortic stenosis    Vertigo     Assessment: 47 YOM presenting with weakness, hx of afib on warfarin PTA with last dose 12/2, INR therapeutic on admission at 2.3, notable drug drug interactions include Flagyl  started this admission so will need to watch for INR uptrend   PTA dosing: 4mg  daily except 6mg  Sun/thur  Significant events: Coumadin  4 mg dose not given 12/3. Charted as not given by prior nurse   12/5 AM update: INR 1.6 Hgb wnl PLT 90K   Goal of Therapy:  INR 2-3 Monitor platelets by anticoagulation  protocol: Yes   Plan:  Warfarin 6 mg PO x 1 today Daily INR, s/s bleeding   Ryoma Nofziger BS, PharmD, BCPS Clinical Pharmacist 02/18/2024 7:56 AM  Contact: [phone] after 3 PM

## 2024-02-18 NOTE — Progress Notes (Signed)
 Physical Therapy Treatment Patient Details Name: [name] MRN: [id number] DOB: [birth year] Today's Date: 02/18/2024   History of Present Illness 89 y.o. male presenting 12/3 with weakness and fall. CT suggestive of possible colitis versus diverticulitis. Pelvis x-ray showed possible right femur fracture. CT R hip negative. (Fx cleared by MD, no precautions or restrictions). Pt admitted with sepsis, colitis vs diverticulitis.    PT Comments  Session focused on providing seated exercises to promote strengthening and trial ambulation with personal cane (hurrycane) to assess safety to return home. Pt tolerated seated exercises well, though requires occasional cues to stay on task. Pt observed to often revert back to ankle pumps when completing knee extensions. Pt demonstrates good STS technique and is steady upon standing. Pt steady throughout ambulation with assist to manage lines while using hurrycane. Pt demonstrates minimal support required of hurrycane when ambulating. Standing romberg balance assessed, pt had no difficulty with eyes open but was unable to maintain eyes closed >5s. Pt anticipated to discharge over the weekend. Pt discharge does not occur, pt would benefit from follow up PT services focused on progressing balance and endurance while ambulating.    If plan is discharge home, recommend the following: A little help with walking and/or transfers;A little help with bathing/dressing/bathroom   Can travel by private vehicle        Equipment Recommendations   (Pt has all appropriate equipment, uses SPC at baseline and has available in room.)    Recommendations for Other Services       Precautions / Restrictions Precautions Precautions: None Restrictions Weight Bearing Restrictions Per Provider Order: No     Mobility  Bed Mobility               General bed mobility comments: Pt already seated in chair upon arrival.    Transfers Overall transfer level: Modified  independent Equipment used: Straight cane Transfers: Sit to/from Stand Sit to Stand: Supervision           General transfer comment: Pt demonstrates appropriate STS mechancis, pushing from chair to stand with cane. Steady upon standing.    Ambulation/Gait Ambulation/Gait assistance: Supervision Gait Distance (Feet): 200 Feet Assistive device: Straight cane Gait Pattern/deviations: Step-through pattern, Decreased step length - right, Decreased step length - left, Trunk flexed   Gait velocity interpretation: 1.31 - 2.62 ft/sec, indicative of limited community ambulator Pre-gait activities: Seated exercises performed prior to standing and ambulating due to soft initial BP, improved after activity, see additional information for vitals. General Gait Details: Pt utilizes cane for balance assist on an as needed basis, pt observed carrying cane for 3-4 steps before placing back on the ground. Pt reports primarily using cane outside and in the community.   Stairs             Wheelchair Mobility     Tilt Bed    Modified Rankin (Stroke Patients Only)       Balance Overall balance assessment: Modified Independent Sitting-balance support: Single extremity supported Sitting balance-Leahy Scale: Normal Sitting balance - Comments: No concerns   Standing balance support: Single extremity supported Standing balance-Leahy Scale: Good Standing balance comment: Pt demonstrates minimal postural sway in static stance with eyes open, significant postural sway noted with eyes closed but is able to steady self after opening eyes again. Of note: pt no longer has 4th and 5th digit on R LE, contibuting to altered balance.         Rhomberg - Eyes Opened: 30 Rhomberg - Eyes  Closed: 5                Communication Communication Communication: No apparent difficulties Factors Affecting Communication: Hearing impaired  Cognition Arousal: Alert Behavior During Therapy: WFL for  tasks assessed/performed   PT - Cognitive impairments: No apparent impairments                       PT - Cognition Comments: Pt appropriately follows commands throughout session and demonstrates good safey awareness for functional tasks. Following commands: Intact      Cueing Cueing Techniques: Verbal cues, Tactile cues  Exercises General Exercises - Lower Extremity Ankle Circles/Pumps: AROM, 10 reps, Seated Long Arc Quad: 10 reps, Seated, AROM Hip Flexion/Marching: AROM, 10 reps, Seated    General Comments General comments (skin integrity, edema, etc.): No new skin abnormalities. IV and telemetry intact. Seated vitals: BP 90/54, HR 86, SpO2 94%. Standing BP 121/56. Pt asymptomatic throughout session and VSS.      Pertinent Vitals/Pain Pain Assessment Pain Assessment: No/denies pain    Home Living                          Prior Function            PT Goals (current goals can now be found in the care plan section) Progress towards PT goals: Progressing toward goals    Frequency    Min 2X/week      PT Plan      Co-evaluation              AM-PAC PT 6 Clicks Mobility   Outcome Measure  Help needed turning from your back to your side while in a flat bed without using bedrails?: None Help needed moving from lying on your back to sitting on the side of a flat bed without using bedrails?: None Help needed moving to and from a bed to a chair (including a wheelchair)?: None Help needed standing up from a chair using your arms (e.g., wheelchair or bedside chair)?: None Help needed to walk in hospital room?: A Little Help needed climbing 3-5 steps with a railing? : A Little 6 Click Score: 22    End of Session Equipment Utilized During Treatment: Gait belt Activity Tolerance: Patient tolerated treatment well Patient left: in chair;with call bell/phone within reach;with family/visitor present Nurse Communication: Mobility status PT Visit  Diagnosis: Unsteadiness on feet (R26.81);Other abnormalities of gait and mobility (R26.89)     Time: 9065-9041 PT Time Calculation (min) (ACUTE ONLY): 24 min  Charges:    $Gait Training: 8-22 mins $Therapeutic Exercise: 8-22 mins PT General Charges $$ ACUTE PT VISIT: 1 Visit                     Sabra Morel, PT, DPT  Acute Rehabilitation Services         Office: [phone]     Sabra MARLA Morel 02/18/2024, 12:23 PM

## 2024-02-19 ENCOUNTER — Other Ambulatory Visit (HOSPITAL_COMMUNITY): Payer: Self-pay

## 2024-02-19 DIAGNOSIS — A419 Sepsis, unspecified organism: Secondary | ICD-10-CM | POA: Diagnosis not present

## 2024-02-19 LAB — BASIC METABOLIC PANEL WITH GFR
Anion gap: 9 (ref 5–15)
BUN: 14 mg/dL (ref 8–23)
CO2: 25 mmol/L (ref 22–32)
Calcium: 7.9 mg/dL — ABNORMAL LOW (ref 8.9–10.3)
Chloride: 100 mmol/L (ref 98–111)
Creatinine, Ser: 1.2 mg/dL (ref 0.61–1.24)
GFR, Estimated: 57 mL/min — ABNORMAL LOW (ref >60)
Glucose, Bld: 112 mg/dL — ABNORMAL HIGH (ref 70–99)
Potassium: 3.7 mmol/L (ref 3.5–5.1)
Sodium: 134 mmol/L — ABNORMAL LOW (ref 135–145)

## 2024-02-19 LAB — CBC WITH DIFFERENTIAL/PLATELET
Abs Immature Granulocytes: 0.03 K/uL (ref 0.00–0.07)
Basophils Absolute: 0 K/uL (ref 0.0–0.1)
Basophils Relative: 0 %
Eosinophils Absolute: 0.1 K/uL (ref 0.0–0.5)
Eosinophils Relative: 2 %
HCT: 41.9 % (ref 39.0–52.0)
Hemoglobin: 14.2 g/dL (ref 13.0–17.0)
Immature Granulocytes: 0 %
Lymphocytes Relative: 13 %
Lymphs Abs: 0.9 K/uL (ref 0.7–4.0)
MCH: 28.6 pg (ref 26.0–34.0)
MCHC: 33.9 g/dL (ref 30.0–36.0)
MCV: 84.5 fL (ref 80.0–100.0)
Monocytes Absolute: 0.8 K/uL (ref 0.1–1.0)
Monocytes Relative: 12 %
Neutro Abs: 4.9 K/uL (ref 1.7–7.7)
Neutrophils Relative %: 73 %
Platelets: 99 K/uL — ABNORMAL LOW (ref 150–400)
RBC: 4.96 MIL/uL (ref 4.22–5.81)
RDW: 14.9 % (ref 11.5–15.5)
WBC: 6.7 K/uL (ref 4.0–10.5)
nRBC: 0 % (ref 0.0–0.2)

## 2024-02-19 LAB — PHOSPHORUS: Phosphorus: 2.5 mg/dL (ref 2.5–4.6)

## 2024-02-19 LAB — PROTIME-INR
INR: 1.5 — ABNORMAL HIGH (ref 0.8–1.2)
Prothrombin Time: 18.4 s — ABNORMAL HIGH (ref 11.4–15.2)

## 2024-02-19 LAB — MAGNESIUM: Magnesium: 1.9 mg/dL (ref 1.7–2.4)

## 2024-02-19 MED ORDER — METRONIDAZOLE 500 MG PO TABS
500.0000 mg | ORAL_TABLET | Freq: Three times a day (TID) | ORAL | 0 refills | Status: AC
  Filled 2024-02-19: qty 9, 3d supply, fill #0

## 2024-02-19 MED ORDER — CEPHALEXIN 500 MG PO CAPS
500.0000 mg | ORAL_CAPSULE | Freq: Three times a day (TID) | ORAL | 0 refills | Status: AC
  Filled 2024-02-19: qty 9, 3d supply, fill #0

## 2024-02-19 NOTE — Plan of Care (Signed)
  Problem: Health Behavior/Discharge Planning: Goal: Ability to manage health-related needs will improve Outcome: Progressing   Problem: Clinical Measurements: Goal: Will remain free from infection Outcome: Progressing   Problem: Activity: Goal: Risk for activity intolerance will decrease Outcome: Progressing   Problem: Safety: Goal: Ability to remain free from injury will improve Outcome: Progressing   

## 2024-02-19 NOTE — Plan of Care (Signed)
  Problem: Fluid Volume: Goal: Hemodynamic stability will improve Outcome: Completed/Met   Problem: Clinical Measurements: Goal: Diagnostic test results will improve Outcome: Completed/Met Goal: Signs and symptoms of infection will decrease Outcome: Completed/Met   Problem: Respiratory: Goal: Ability to maintain adequate ventilation will improve Outcome: Completed/Met   Problem: Education: Goal: Knowledge of General Education information will improve Description: Including pain rating scale, medication(s)/side effects and non-pharmacologic comfort measures Outcome: Completed/Met   Problem: Health Behavior/Discharge Planning: Goal: Ability to manage health-related needs will improve Outcome: Completed/Met   Problem: Clinical Measurements: Goal: Ability to maintain clinical measurements within normal limits will improve Outcome: Completed/Met Goal: Will remain free from infection Outcome: Completed/Met Goal: Diagnostic test results will improve Outcome: Completed/Met Goal: Respiratory complications will improve Outcome: Completed/Met Goal: Cardiovascular complication will be avoided Outcome: Completed/Met   Problem: Activity: Goal: Risk for activity intolerance will decrease Outcome: Completed/Met   Problem: Nutrition: Goal: Adequate nutrition will be maintained Outcome: Completed/Met   Problem: Coping: Goal: Level of anxiety will decrease Outcome: Completed/Met   Problem: Elimination: Goal: Will not experience complications related to bowel motility Outcome: Completed/Met Goal: Will not experience complications related to urinary retention Outcome: Completed/Met   Problem: Pain Managment: Goal: General experience of comfort will improve and/or be controlled Outcome: Completed/Met   Problem: Safety: Goal: Ability to remain free from injury will improve Outcome: Completed/Met   Problem: Skin Integrity: Goal: Risk for impaired skin integrity will  decrease Outcome: Completed/Met

## 2024-02-19 NOTE — Discharge Instructions (Addendum)
 Follow with Primary MD Nathan Arlyss RAMAN, MD in 3 days   Get CBC, CMP, Magnesium , INR, 2 view Chest X ray -  checked next visit with your primary MD   Activity: As tolerated with Full fall precautions use walker/cane & assistance as needed  Disposition Home   Diet: Heart Healthy    Special Instructions: If you have smoked or chewed Tobacco  in the last 2 yrs please stop smoking, stop any regular Alcohol  and or any Recreational drug use.  On your next visit with your primary care physician please Get Medicines reviewed and adjusted.  Please request your Prim.MD to go over all Hospital Tests and Procedure/Radiological results at the follow up, please get all Hospital records sent to your Prim MD by signing hospital release before you go home.  If you experience worsening of your admission symptoms, develop shortness of breath, life threatening emergency, suicidal or homicidal thoughts you must seek medical attention immediately by calling 911 or calling your MD immediately  if symptoms less severe.  You Must read complete instructions/literature along with all the possible adverse reactions/side effects for all the Medicines you take and that have been prescribed to you. Take any new Medicines after you have completely understood and accpet all the possible adverse reactions/side effects.   Do not drive when taking Pain medications.  Do not take more than prescribed Pain, Sleep and Anxiety Medications  Wear Seat belts while driving.

## 2024-02-19 NOTE — Discharge Summary (Addendum)
 Discharge summary note.  [name] FMW:[id number] DOB: 02-22-1931 DOA: 02/16/2024  PCP: Nathan Arlyss RAMAN, MD  Admit date: 02/16/2024  Discharge date: 02/19/2024  Admitted From: Home   Disposition:  Home   Recommendations for Outpatient Follow-up:   Follow up with PCP in 1-2 weeks  PCP Please obtain BMP/CBC, 2 view CXR in 1week,  (see Discharge instructions)   PCP Please follow up on the following pending results: Monitor CBC, BMP, magnesium  and INR closely   Home Health: PT, RN if qualifies Equipment/Devices: See below Consultations: Cardiology Discharge Condition: Stable    CODE STATUS: Full    Diet Recommendation: Heart Healthy     Chief Complaint  Patient presents with   Weakness     Brief history of present illness from the day of admission and additional interim summary     89 y.o. male with medical history significant of hypertension, hyperlipidemia, GERD, esophageal stricture, CKD 3, CAD status post stent, A-fib, status post TAVR, OSA, obesity, gout presenting with weakness.  Apparently he ate some dinner 2 days ago after which he developed profound diarrhea, subsequently developed generalized weakness and got dizzy upon standing up.  He also slid on the floor while getting out of the bed at home, came to the ER where he was diagnosed with dehydration, hypotension, CT suggestive of possible colitis versus diverticulitis, went briefly into A-fib RVR in the ER requiring amiodarone  drip.  Was admitted for further care                                                                  Hospital Course   Colitis versus diverticulitis > Patient presenting with weakness, nausea, diarrhea after having some dinner.  No other family members got sick, no other sick contacts, has been treated for infectious  colitis/diverticulitis with antibiotics, CT noted, overall much improved diarrhea has improved.  GI symptoms have resolved, 3 more days of oral antibiotics and discharged home with outpatient PCP and GI follow-up.   Weakness > Weakness from above, was hydrated, much improved, did well with PT after supportive care he is back to his baseline will be discharged with home PT.   Paroxysmal atrial fibrillation, Chad vasc 2 score of greater than 3, history of TAVR.  On beta-blocker and Coumadin  continued, went into RVR in the ER likely due to dehydration from #1 above, required amiodarone  drip briefly now stable continue home regimen of beta-blocker - DC Cardizem , of note TSH was stable.  Continue home Coumadin  INR has been intermittently subtherapeutic even in the outpatient setting, defer this to PCP and primary cardiologist.  Currently recent colitis and possible diverticulitis on CT hence let INR rise gradually.   Hypertension - Blood pressure has stabilized, continue home regimen of diuretic, beta-blocker, DC Cardizem  per  Cards   Hypophosphatemia.  Replaced.     Hyperlipidemia - Continue home regimen   GERD > History of related esophageal stricture - Continue home PPI   CAD > Status post stent - Continue home aspirin , metoprolol , warfarin   OSA - Continue home CPAP nightly.   CKD 3 > Creatinine mildly elevated but near baseline. - PCP to monitor   Obesity - Noted, follow-up with PCP   Gout - Continue allopurinol   Discharge diagnosis     Principal Problem:   Sepsis (HCC) Active Problems:   Gout   OBESITY   Primary hypertension   Coronary artery disease with exertional angina   GASTROESOPHAGEAL REFLUX DISEASE   OSA on CPAP   Chest pain of uncertain etiology   CKD (chronic kidney disease), stage III (HCC)   Hyperlipidemia   Coronary artery disease due to lipid rich plaque   Chronic atrial fibrillation (HCC)   S/P TAVR (transcatheter aortic valve replacement)   Acute  respiratory failure with hypoxia (HCC)   Atrial fibrillation with RVR (HCC)    Discharge instructions    Discharge Instructions     Diet - low sodium heart healthy   Complete by: As directed    Discharge instructions   Complete by: As directed    Follow with Primary MD Nathan Arlyss RAMAN, MD in 3 days   Get CBC, CMP, Magnesium , INR, 2 view Chest X ray -  checked next visit with your primary MD   Activity: As tolerated with Full fall precautions use walker/cane & assistance as needed  Disposition Home   Diet: Heart Healthy    Special Instructions: If you have smoked or chewed Tobacco  in the last 2 yrs please stop smoking, stop any regular Alcohol  and or any Recreational drug use.  On your next visit with your primary care physician please Get Medicines reviewed and adjusted.  Please request your Prim.MD to go over all Hospital Tests and Procedure/Radiological results at the follow up, please get all Hospital records sent to your Prim MD by signing hospital release before you go home.  If you experience worsening of your admission symptoms, develop shortness of breath, life threatening emergency, suicidal or homicidal thoughts you must seek medical attention immediately by calling 911 or calling your MD immediately  if symptoms less severe.  You Must read complete instructions/literature along with all the possible adverse reactions/side effects for all the Medicines you take and that have been prescribed to you. Take any new Medicines after you have completely understood and accpet all the possible adverse reactions/side effects.   Do not drive when taking Pain medications.  Do not take more than prescribed Pain, Sleep and Anxiety Medications  Wear Seat belts while driving.   Increase activity slowly   Complete by: As directed        Discharge Medications   Allergies as of 02/19/2024       Reactions   Penicillins Shortness Of Breath, Swelling   Patient tolerated Rocephin   injection 2019 PATIENT HAS HAD A PCN REACTION WITH IMMEDIATE RASH, FACIAL/TONGUE/THROAT SWELLING, SOB, OR LIGHTHEADEDNESS WITH HYPOTENSION:  #  #  YES  #  Has patient had a PCN reaction causing severe rash involving mucus membranes or skin necrosis: NO Has patient had a PCN reaction that required hospitalization NO Has patient had a PCN reaction occurring within the last 10 years: NO If all of the above answers are NO, then may proceed with Cephalosporin use.   Atorvastatin  Other (See  Comments)   No energy, just felt bad   Pravastatin  Other (See Comments)   myalgias   Atrovent  Nasal Spray [ipratropium] Other (See Comments)   Dizzy with use   Hydrocodone  Itching, Other (See Comments)   swimmy headed after use   Nsaids Other (See Comments)   Due to renal function   Uloric  [febuxostat ] Other (See Comments)   Would avoid, worsening gout sx with use.    Vancomycin  Other (See Comments)   Red man syndrome- tolerated with slow run and benadryl  use   Clarithromycin Diarrhea   Doxycycline  Other (See Comments)   GI upset        Medication List     STOP taking these medications    diltiazem  180 MG 24 hr capsule Commonly known as: CARDIZEM  CD       TAKE these medications    acetaminophen  500 MG tablet Commonly known as: TYLENOL  Take 500 mg by mouth as needed for mild pain.   albuterol  108 (90 Base) MCG/ACT inhaler Commonly known as: VENTOLIN  HFA Inhale 1-2 puffs into the lungs every 6 (six) hours as needed for wheezing or shortness of breath.   allopurinol  100 MG tablet Commonly known as: ZYLOPRIM  TAKE 1 TABLET BY MOUTH ONCE DAILY   aspirin  EC 81 MG tablet Take 1 tablet (81 mg total) by mouth daily. Swallow whole.   BENADRYL  PO Take 1 tablet by mouth as needed.   cephALEXin  500 MG capsule Commonly known as: KEFLEX  Take 4 capsules by mouth 30-60 min prior to ALL dental procedures. What changed: Another medication with the same name was added. Make sure you understand  how and when to take each.   cephALEXin  500 MG capsule Commonly known as: KEFLEX  Take 1 capsule (500 mg total) by mouth 3 (three) times daily for 3 days. What changed: You were already taking a medication with the same name, and this prescription was added. Make sure you understand how and when to take each.   colchicine  0.6 MG tablet TAKE 1 TABLET BY MOUTH TWICE A DAY IF NEEDED THEN 1 DAILY IF NEEDED THEREAFTER FOR GOUT   cyanocobalamin  1000 MCG/ML injection Commonly known as: VITAMIN B12 1000mcg IM every 28 days What changed:  how much to take how to take this when to take this additional instructions   furosemide  20 MG tablet Commonly known as: LASIX  TAKE 1 TABLET BY MOUTH ONCE DAILY   metoprolol  tartrate 25 MG tablet Commonly known as: LOPRESSOR  Take 0.5 tablets (12.5 mg total) by mouth 2 (two) times daily.   metroNIDAZOLE  500 MG tablet Commonly known as: FLAGYL  Take 1 tablet (500 mg total) by mouth 3 (three) times daily for 3 days.   omeprazole  40 MG capsule Commonly known as: PRILOSEC  Take 1 capsule (40 mg total) by mouth daily.   tamsulosin  0.4 MG Caps capsule Commonly known as: FLOMAX  Take 1 capsule (0.4 mg total) by mouth daily.   warfarin 4 MG tablet Commonly known as: COUMADIN  Take as directed. If you are unsure how to take this medication, talk to your nurse or doctor. Original instructions: TAKE 1 TO 1 AND 1/2 TABLETS BY MOUTH DAILY OR AS DIRECTED BY COUMADIN  CLINIC What changed: See the new instructions.               Durable Medical Equipment  (From admission, onward)           Start     Ordered   02/19/24 0918  For home use only DME Walker rolling  Once       Comments: 5 wheel  Question Answer Comment  Walker: With 5 Inch Wheels   Patient needs a walker to treat with the following condition Weakness      02/19/24 0917             Contact information for follow-up providers     Nathan Arlyss RAMAN, MD. Schedule an appointment as  soon as possible for a visit in 3 day(s).   Specialty: Family Medicine Contact information: [address] [place] [address] [phone]         [facility]. Schedule an appointment as soon as possible for a visit in 1 week(s).   Specialty: Gastroenterology Contact information: [address] [place]  [address] [phone]        Wonda Sharper, MD. Schedule an appointment as soon as possible for a visit in 1 week(s).   Specialty: Cardiology Contact information: [address] [place] [address] [phone]              Contact information for after-discharge care     Home Medical Care     [facility] [place] [facility]) .   Service: Home Health Services Contact information: [address] [place]  [address] [phone]                     Major procedures and Radiology Reports - PLEASE review detailed and final reports thoroughly  -        DG Chest Port 1 View Result Date: 02/17/2024 CLINICAL DATA:  Shortness of breath EXAM: PORTABLE CHEST 1 VIEW COMPARISON:  Chest radiograph dated 02/16/2024 FINDINGS: Normal lung volumes. Increased diffuse interstitial opacities, right-greater-than-left. No pleural effusion or pneumothorax. The heart size and mediastinal contours are within normal limits. No acute osseous abnormality. IMPRESSION: Increased diffuse interstitial opacities, right-greater-than-left, which may represent pulmonary edema or atypical infection. Electronically Signed   By: Limin  Xu M.D.   On: 02/17/2024 07:51   DG CHEST PORT 1 VIEW Result Date: 02/16/2024 EXAM: 1 VIEW(S) XRAY OF THE CHEST 02/16/2024 05:43:00 PM COMPARISON: None available. CLINICAL HISTORY: Acute respiratory failure with hypoxia (HCC) [id number]. FINDINGS: LUNGS AND PLEURA: Mildly increased interstitial densities are noted bilaterally consistent with pulmonary edema. No pleural  effusion. No pneumothorax. HEART AND MEDIASTINUM: Stable cardiomediastinal silhouette. BONES AND SOFT TISSUES: No acute osseous abnormality. IMPRESSION: 1. Mild interstitial pulmonary edema bilaterally. Electronically signed by: Lynwood Seip MD 02/16/2024 07:00 PM EST RP Workstation: [id number]   CT CHEST ABDOMEN PELVIS WO CONTRAST Result Date: 02/16/2024 CLINICAL DATA:  Tried to get out of bed and slid to the floor. Nausea. Concern for sepsis. EXAM: CT CHEST, ABDOMEN AND PELVIS WITHOUT CONTRAST TECHNIQUE: Multidetector CT imaging of the chest, abdomen and pelvis was performed following the standard protocol without IV contrast. RADIATION DOSE REDUCTION: This exam was performed according to the departmental dose-optimization program which includes automated exposure control, adjustment of the mA and/or kV according to patient size and/or use of iterative reconstruction technique. COMPARISON:  CT chest dated 04/19/2023. CTA chest abdomen and pelvis dated 03/17/2022. FINDINGS: CT CHEST FINDINGS Cardiovascular: Normal heart size. No pericardial effusion. Status post TAVR. Nonaneurysmal thoracic aorta with atherosclerotic calcification extending into the origins of the arch branch vessels. Coronary artery calcifications. Dense mitral annulus calcification. Mediastinum/Nodes: No enlarged mediastinal or axillary lymph nodes. Calcified mediastinal and hilar granulomatous nodes. Trachea and esophagus are within normal limits. Lungs/Pleura: Lungs appear generally well  aerated. Stable 4 mm nodule in the right upper lobe and additional adjacent smaller 2 mm nodule, which have demonstrated long-term stability and for which no further follow-up imaging is recommended. Scattered calcified granulomas. No focal consolidation. No pleural effusion or pneumothorax. Musculoskeletal: No acute osseous abnormality. Multilevel degenerative changes of the thoracic spine, advanced at T9-T10. CT ABDOMEN PELVIS FINDINGS Hepatobiliary: Liver  is stable in size and morphology with similar contour surface irregularity, can not exclude cirrhosis. No suspicious focal hepatic lesions identified within the limits of an unenhanced exam. Punctate calcified granulomas. Gallbladder is unremarkable. No biliary dilatation. Pancreas: Unremarkable. Spleen: Unremarkable. Adrenals/Urinary Tract: Adrenal glands are unremarkable. No urolithiasis or hydronephrosis. Bladder is unremarkable. Stomach/Bowel: Stomach is within normal limits. Small bowel is grossly unremarkable. Normal appendix. No obstruction. There is wall thickening and mild surrounding inflammatory stranding and haziness of the ascending colon extending to the level of the hepatic flexure, which could be secondary to a nonspecific colitis or possibly diverticulitis given the presence of right colonic diverticulosis. No evidence of perforation or abscess. Diverticulosis throughout the descending and sigmoid colon. Vascular/Lymphatic: Abdominal aorta is normal in caliber with atherosclerotic calcification. No enlarged abdominal or pelvic lymph nodes. Reproductive: Prostate is unremarkable. Other: No abdominopelvic ascites.  No intraperitoneal free air. Musculoskeletal: No acute osseous abnormality. Multilevel degenerative changes of the lumbar spine. IMPRESSION: 1. Wall thickening of the ascending colon with mild surrounding inflammatory changes extending to the level of the hepatic flexure, which could be secondary to a nonspecific colitis or possibly diverticulitis given the presence of right colonic diverticulosis. No evidence of perforation or abscess. 2. Diverticulosis of the descending and sigmoid colon. 3. Similar liver contour surface irregularity, can not exclude cirrhosis. No suspicious focal hepatic lesion identified within the limits of an unenhanced exam. 4.  Aortic Atherosclerosis (ICD10-I70.0). Electronically Signed   By: Harrietta Sherry M.D.   On: 02/16/2024 12:55   DG Elbow Complete  Left Result Date: 02/16/2024 CLINICAL DATA:  Recent fall, pain EXAM: LEFT ELBOW - COMPLETE 3+ VIEW COMPARISON:  None Available. FINDINGS: Bones are osteopenic. Minor degenerative changes of the left elbow joint. Normal alignment without acute displaced fracture or large effusion. 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 Peripheral vascular calcifications noted. Overlying artifact from the peripheral IV present. IMPRESSION: 1. Osteopenia and degenerative changes. 2. No acute osseous finding by plain radiography. Electronically Signed   By: CHRISTELLA.  Shick M.D.   On: 02/16/2024 12:40   CT Hip Right Wo Contrast Result Date: 02/16/2024 CLINICAL DATA:  Tried to get out of bed and slid to the floor. EXAM: CT OF THE RIGHT HIP WITHOUT CONTRAST TECHNIQUE: Multidetector CT imaging of the right hip was performed according to the standard protocol. Multiplanar CT image reconstructions were also generated. RADIATION DOSE REDUCTION: This exam was performed according to the departmental dose-optimization program which includes automated exposure control, adjustment of the mA and/or kV according to patient size and/or use of iterative reconstruction technique. COMPARISON:  None Available. FINDINGS: Bones/Joint/Cartilage No acute fracture or dislocation. The right femoral head is seated within the acetabulum. Mild osteoarthritis of the right hip with joint space narrowing and small osteophytes. The visualized right sacroiliac joint and pubic symphysis are anatomically aligned. Ligaments Ligaments are suboptimally evaluated by CT. Muscles and Tendons Muscles are within normal limits. Soft tissue No fluid collection. No enlarged lymph nodes identified in the field of view. Visualized intrapelvic contents are grossly unremarkable. Peripheral vascular calcifications. IMPRESSION: 1. No  acute osseous abnormality. 2. Mild osteoarthritis of the right hip.  Electronically Signed   By: Harrietta Sherry M.D.   On: 02/16/2024 12:32   DG Pelvis 1-2 Views Result Date: 02/16/2024 EXAM: 1 or 2 VIEW(S) XRAY OF THE PELVIS 02/16/2024 10:53:00 AM COMPARISON: Prominent CT 03/17/2022. CLINICAL HISTORY: Fall. FINDINGS: BONES AND JOINTS: Possible acute fracture of the right femoral neck with foreshortening. Degenerative changes of the visualized lower lumbar spine. Minimal symmetric osteoarthritic changes of the hips. Diffuse decreased bone mineralization. No joint dislocation. SOFT TISSUES: No focal abnormality. IMPRESSION: 1. Possible right femoral neck fracture. Recommend dedicated right hip series versus CT for further evaluation. 2. Diffuse decreased bone mineralization. 3. Minimal symmetric osteoarthritic changes of the hips. Electronically signed by: Toribio Agreste MD 02/16/2024 11:35 AM EST RP Workstation: [id number]   CT Head Wo Contrast Result Date: 02/16/2024 EXAM: CT HEAD WITHOUT CONTRAST 02/16/2024 11:10:21 AM TECHNIQUE: CT of the head was performed without the administration of intravenous contrast. Automated exposure control, iterative reconstruction, and/or weight based adjustment of the mA/kV was utilized to reduce the radiation dose to as low as reasonably achievable. COMPARISON: None available. CLINICAL HISTORY: Head trauma, minor (Age >= 65y) FINDINGS: BRAIN AND VENTRICLES: No acute hemorrhage. No evidence of acute infarct. No hydrocephalus. No extra-axial collection. No mass effect or midline shift. Patchy white matter hypodensities, compatible with chronic microvascular ischemic change. ORBITS: No acute abnormality. SINUSES: No acute abnormality. SOFT TISSUES AND SKULL: No acute soft tissue abnormality. No skull fracture. IMPRESSION: 1. No acute intracranial abnormality. Electronically signed by: Gilmore Molt MD 02/16/2024 11:30 AM EST RP Workstation: [id number]   DG Chest Port 1 View Result Date: 02/16/2024 EXAM: 1 VIEW(S) XRAY OF THE CHEST 02/16/2024  10:53:00 AM COMPARISON: 02/03/2023, chest CT 04/19/2023. CLINICAL HISTORY: fever FINDINGS: LUNGS AND PLEURA: Multiple calcified granulomas in lungs bilaterally, unchanged. No pleural effusion. No pneumothorax. HEART AND MEDIASTINUM: Aortic atherosclerosis. Aortic valve replacement noted. No acute abnormality of the cardiac and mediastinal silhouettes. BONES AND SOFT TISSUES: No acute osseous abnormality. IMPRESSION: 1. No acute cardiopulmonary process. 2. Multiple calcified granulomas in both lungs, unchanged. Electronically signed by: Toribio Agreste MD 02/16/2024 11:28 AM EST RP Workstation: [id number]    Micro Results    Recent Results (from the past 240 hours)  Resp panel by RT-PCR (RSV, Flu A&B, Covid) Anterior Nasal Swab     Status: None   Collection Time: 02/16/24 10:25 AM   Specimen: Anterior Nasal Swab  Result Value Ref Range Status   SARS Coronavirus 2 by RT PCR NEGATIVE NEGATIVE Final   Influenza A by PCR NEGATIVE NEGATIVE Final   Influenza B by PCR NEGATIVE NEGATIVE Final    Comment: (NOTE) The Xpert Xpress SARS-CoV-2/FLU/RSV plus assay is intended as an aid in the diagnosis of influenza from Nasopharyngeal swab specimens and should not be used as a sole basis for treatment. Nasal washings and aspirates are unacceptable for Xpert Xpress SARS-CoV-2/FLU/RSV testing.  Fact Sheet for Patients: bloggercourse.com  Fact Sheet for Healthcare Providers: seriousbroker.it  This test is not yet approved or cleared by the [place]  FDA and has been authorized for detection and/or diagnosis of SARS-CoV-2 by FDA under an Emergency Use Authorization (EUA). This EUA will remain in effect (meaning this test can be used) for the duration of the COVID-19 declaration under Section 564(b)(1) of the Act, 21 U.S.C. section 360bbb-3(b)(1), unless the authorization is terminated or revoked.     Resp Syncytial Virus by PCR NEGATIVE NEGATIVE Final     Comment: (NOTE)  Fact Sheet for Patients: bloggercourse.com  Fact Sheet for Healthcare Providers: seriousbroker.it  This test is not yet approved or cleared by the [place]  FDA and has been authorized for detection and/or diagnosis of SARS-CoV-2 by FDA under an Emergency Use Authorization (EUA). This EUA will remain in effect (meaning this test can be used) for the duration of the COVID-19 declaration under Section 564(b)(1) of the Act, 21 U.S.C. section 360bbb-3(b)(1), unless the authorization is terminated or revoked.  Performed at [facility], [address], [place], [place] [address]   Culture, blood (routine x 2)     Status: None (Preliminary result)   Collection Time: 02/16/24 10:26 AM   Specimen: BLOOD  Result Value Ref Range Status   Specimen Description BLOOD SITE NOT SPECIFIED  Final   Special Requests   Final    BOTTLES DRAWN AEROBIC ONLY Blood Culture results may not be optimal due to an inadequate volume of blood received in culture bottles   Culture   Final    NO GROWTH 2 DAYS Performed at [facility], [address], [place], [place] [address]    Report Status PENDING  Incomplete  MRSA Next Gen by PCR, Nasal     Status: None   Collection Time: 02/17/24  1:39 AM   Specimen: Nasal Mucosa; Nasal Swab  Result Value Ref Range Status   MRSA by PCR Next Gen NOT DETECTED NOT DETECTED Final    Comment: (NOTE) The GeneXpert MRSA Assay (FDA approved for NASAL specimens only), is one component of a comprehensive MRSA colonization surveillance program. It is not intended to diagnose MRSA infection nor to guide or monitor treatment for MRSA infections. Test performance is not FDA approved in patients less than 86 years old. Performed at [facility], [address] [place], [place] [address]   Culture, blood (routine x 2)     Status: None (Preliminary result)   Collection Time: 02/17/24  3:57  AM   Specimen: BLOOD  Result Value Ref Range Status   Specimen Description BLOOD SITE NOT SPECIFIED  Final   Special Requests   Final    BOTTLES DRAWN AEROBIC AND ANAEROBIC Blood Culture adequate volume   Culture   Final    NO GROWTH 1 DAY Performed at [facility], [address] [place], [place] [address]    Report Status PENDING  Incomplete    Today   Subjective    Beniah Magnan today has no headache,no chest abdominal pain,no new weakness tingling or numbness, feels much better wants to go home today.    Objective   Blood pressure 128/68, pulse 93, temperature 98.4 F (36.9 C), temperature source Axillary, resp. rate 20, height 5' 6 (1.676 m), weight 90 kg, SpO2 94%.   Intake/Output Summary (Last 24 hours) at 02/19/2024 0947 Last data filed at 02/19/2024 9176 Gross per 24 hour  Intake 935.55 ml  Output 2450 ml  Net -1514.45 ml    Exam  Awake Alert, No new F.N deficits,    [email or web address],PERRAL Supple Neck,   Symmetrical Chest wall movement, Good air movement bilaterally, CTAB RRR,No Gallops,   +ve B.Sounds, Abd Soft, Non tender,  No Cyanosis, Clubbing or edema    Data Review   Recent Labs  Lab 02/16/24 1040 02/16/24 1049 02/16/24 1050 02/17/24 0357 02/18/24 0348 02/19/24 0432  WBC 14.9*  --   --  11.7* 7.8 6.7  HGB 15.7 16.0 16.0 15.0 14.0 14.2  HCT 47.4 47.0  47.0 44.3 41.0 41.9  PLT 124*  --   --  112* 90* 99*  MCV 85.7  --   --  84.2 84.0 84.5  MCH 28.4  --   --  28.5 28.7 28.6  MCHC 33.1  --   --  33.9 34.1 33.9  RDW 14.9  --   --  15.2 15.0 14.9  LYMPHSABS 0.5*  --   --   --  0.9 0.9  MONOABS 0.9  --   --   --  0.7 0.8  EOSABS 0.0  --   --   --  0.0 0.1  BASOSABS 0.0  --   --   --  0.0 0.0    Recent Labs  Lab 02/16/24 1040 02/16/24 1049 02/16/24 1050 02/16/24 1318 02/17/24 0357 02/18/24 0348 02/19/24 0432  NA 138 139 138  --  136 135 134*  K 4.2 4.2 4.2  --  3.8 3.7 3.7  CL 99 98  --   --  101 99 100  CO2 27  --   --   --  26 25 25    ANIONGAP 12  --   --   --  9 11 9   GLUCOSE 138* 136*  --   --  115* 110* 112*  BUN 16 22  --   --  16 16 14   CREATININE 1.60* 1.60*  --   --  1.47* 1.33* 1.20  AST 28  --   --   --  29  --   --   ALT 18  --   --   --  17  --   --   ALKPHOS 80  --   --   --  70  --   --   BILITOT 0.8  --   --   --  1.0  --   --   ALBUMIN 3.3*  --   --   --  2.9*  --   --   LATICACIDVEN  --   --  3.5* 2.5*  --   --   --   INR 2.3*  --   --   --  1.9* 1.6* 1.5*  TSH  --   --   --   --  1.676  --   --   BNP 191.3*  --   --   --   --   --   --   MG  --   --   --   --  1.8 2.0 1.9  PHOS  --   --   --   --   --  2.4* 2.5  CALCIUM  8.6*  --   --   --  8.1* 8.3* 7.9*    Total Time in preparing paper work, data evaluation and todays exam - 35 minutes  Signature  -    Lavada Stank M.D on 02/19/2024 at 9:47 AM   -  To page go to [email or web address]

## 2024-02-19 NOTE — TOC Transition Note (Signed)
 Transition of Care [facility]) - Discharge Note   Patient Details  Name: [name] MRN: [id number] Date of Birth: [birth year]  [facility] CM/SW Contact:  Tom-Johnson, Harvest Muskrat, RN Phone Number: 02/19/2024, 9:36 AM   Clinical Narrative:     Patient is scheduled for discharge today.  Readmission Risk Assessment done. Home health info, outpatient f/u, hospital f/u and discharge instructions on AVS. Prescriptions sent to [facility] pharmacy and patient will receive meds prior discharge. Daughter, Francis to transport at discharge.  No further ICM needs noted.      Final next level of care: Home w Home Health Services Barriers to Discharge: Barriers Resolved   Patient Goals and CMS Choice Patient states their goals for this hospitalization and ongoing recovery are:: To return home CMS Medicare.gov Compare Post Acute Care list provided to:: Patient Choice offered to / list presented to : Patient, Adult Children      Discharge Placement                Patient to be transferred to facility by: Daughter Name of family member notified: Tabatha    Discharge Plan and Services Additional resources added to the After Visit Summary for                                       Social Drivers of Health (SDOH) Interventions SDOH Screenings   Food Insecurity: No Food Insecurity (02/17/2024)  Housing: Unknown (02/17/2024)  Transportation Needs: No Transportation Needs (02/17/2024)  Utilities: Not At Risk (02/17/2024)  Alcohol Screen: Low Risk  (12/08/2023)  Depression (PHQ2-9): Low Risk  (12/08/2023)  Financial Resource Strain: Low Risk  (12/08/2023)  Physical Activity: Inactive (12/08/2023)  Social Connections: Moderately Isolated (02/17/2024)  Stress: No Stress Concern Present (12/08/2023)  Tobacco Use: Medium Risk (02/16/2024)  Health Literacy: Adequate Health Literacy (12/08/2023)     Readmission Risk Interventions    02/19/2024    9:35 AM  Readmission  Risk Prevention Plan  Transportation Screening Complete  PCP or Specialist Appt within 5-7 Days Complete  Home Care Screening Complete  Medication Review (RN CM) Referral to Pharmacy

## 2024-02-20 ENCOUNTER — Ambulatory Visit: Payer: Self-pay | Admitting: Family Medicine

## 2024-02-21 ENCOUNTER — Ambulatory Visit: Attending: Family Medicine

## 2024-02-21 ENCOUNTER — Telehealth: Payer: Self-pay

## 2024-02-21 LAB — CULTURE, BLOOD (ROUTINE X 2): Culture: NO GROWTH

## 2024-02-21 NOTE — Patient Instructions (Signed)
 Visit Information  Thank you for taking time to visit with me today. Please don't hesitate to contact me if I can be of assistance to you.  Patient instructions: call or have daughter call and scheduled hospital follow up visit with his primary care provider.  take his antibiotic until completed. notify provider for any new and/ or ongoing symptoms.  seek emergency medical services for severe symptoms such as SOB/ chest pain    Patient verbalizes understanding of instructions and care plan provided today and agrees to view in MyChart. Active MyChart status and patient understanding of how to access instructions and care plan via MyChart confirmed with patient.     The patient has been provided with contact information for the care management team and has been advised to call with any health related questions or concerns.   Please call the care guide team at [phone] if you need to cancel or reschedule your appointment.   Please call the Suicide and Crisis Lifeline: 988 call the [place]  National Suicide Prevention Lifeline: 786-562-2089 or TTY: 401-591-6229 TTY 5623912124) to talk to a trained counselor call 1-800-273-TALK (toll free, 24 hour hotline) if you are experiencing a Mental Health or Behavioral Health Crisis or need someone to talk to.  Arvin Seip RN, BSN, CCM [employer], Population Health Case Manager Phone: [phone]

## 2024-02-21 NOTE — Transitions of Care (Post Inpatient/ED Visit) (Signed)
 02/21/2024  Name: [name] MRN: [id number] DOB: 08-15-1930  Today's TOC FU Call Status: Today's TOC FU Call Status:: Successful TOC FU Call Completed TOC FU Call Complete Date: 02/21/24  Patient's Name and Date of Birth confirmed. Name, DOB  Transition Care Management Follow-up Telephone Call Date of Discharge: 02/19/24 Discharge Facility: [facility]) Type of Discharge: Inpatient Admission Primary Inpatient Discharge Diagnosis:: weakness How have you been since you were released from the hospital?: Better Any questions or concerns?: No  Items Reviewed: Did you receive and understand the discharge instructions provided?: Yes (patient states his daughter helps him with this.) Medications obtained,verified, and reconciled?: Yes (Medications Reviewed) Any new allergies since your discharge?: No Dietary orders reviewed?: Yes Type of Diet Ordered:: low salt heart healthy Do you have support at home?: Yes People in Home [RPT]: child(ren), adult Name of Support/Comfort Primary Source: Ginger Landsberg  Medications Reviewed Today: Medications Reviewed Today     Reviewed by Solon Alban E, RN (Registered Nurse) on 02/21/24 at 1118  Med List Status: <None>   Medication Order Taking? Sig Documenting Provider Last Dose Status Informant  acetaminophen  (TYLENOL ) 500 MG tablet [id number] Yes Take 500 mg by mouth as needed for mild pain. [provider]  Active Child, Pharmacy Records, Self  albuterol  (VENTOLIN  HFA) 108 [phone] Base) MCG/ACT inhaler [id number] Yes Inhale 1-2 puffs into the lungs every 6 (six) hours as needed for wheezing or shortness of breath. Donnajean Lynwood DEL, PA-C  Active Child, Pharmacy Records, Self  allopurinol  (ZYLOPRIM ) 100 MG tablet [id number] Yes TAKE 1 TABLET BY MOUTH ONCE DAILY [name], MD  Active Child, Pharmacy Records, Self  aspirin  EC 81 MG tablet [id number] Yes Take 1 tablet (81 mg total) by mouth daily. Swallow whole. Wonda Sharper,  MD  Active Child, Pharmacy Records, Self  cephALEXin  (KEFLEX ) 500 MG capsule [id number] Yes Take 4 capsules by mouth 30-60 min prior to ALL dental procedures. Maccia, Melissa D, RPH-CPP  Active Child, Pharmacy Records, Self  cephALEXin  (KEFLEX ) 500 MG capsule [id number] Yes Take 1 capsule (500 mg total) by mouth 3 (three) times daily for 3 days. Singh, Prashant K, MD  Active   colchicine  0.6 MG tablet [id number] Yes TAKE 1 TABLET BY MOUTH TWICE A DAY IF NEEDED THEN 1 DAILY IF NEEDED THEREAFTER FOR GOUT [name], MD  Active Child, Pharmacy Records, Self  cyanocobalamin  (,VITAMIN B-12,) 1000 MCG/ML injection [id number] Yes 1000mcg IM every 28 days  Patient taking differently: Inject 1,000 mcg into the muscle every 30 (thirty) days.   [name], MD  Active Child, Pharmacy Records, Self           Med Note Vina, [place] R   Thu Jun 25, 2022  1:54 PM)    diphenhydrAMINE  HCl (BENADRYL  PO) [id number] Yes Take 1 tablet by mouth as needed. [provider]  Active Child, Pharmacy Records, Self  furosemide  (LASIX ) 20 MG tablet [id number] Yes TAKE 1 TABLET BY MOUTH ONCE DAILY Wonda Sharper, MD  Active Child, Pharmacy Records, Self  metoprolol  tartrate (LOPRESSOR ) 25 MG tablet [id number] Yes Take 0.5 tablets (12.5 mg total) by mouth 2 (two) times daily. Wonda Sharper, MD  Active Child, Pharmacy Records, Self  metroNIDAZOLE  (FLAGYL ) 500 MG tablet [id number] Yes Take 1 tablet (500 mg total) by mouth 3 (three) times daily for 3 days. Singh, Prashant K, MD  Active   omeprazole  (PRILOSEC ) 40 MG capsule [id number] Yes Take 1 capsule (40 mg total) by mouth daily. Cleatus Arlyss  S, MD  Active Child, Pharmacy Records, Self  tamsulosin  (FLOMAX ) 0.4 MG CAPS capsule [id number] Yes Take 1 capsule (0.4 mg total) by mouth daily. [name], MD  Active Child, Pharmacy Records, Self  warfarin (COUMADIN ) 4 MG tablet [id number] Yes TAKE 1 TO 1 AND 1/2 TABLETS BY MOUTH DAILY OR AS DIRECTED BY COUMADIN  CLINIC   Patient taking differently: Take 4-5 mg by mouth See admin instructions. Take 1 tablet by mouth every day except Sunday and Thursdays. On those days take 1.5 tablets by mouth.   Wonda Sharper, MD  Active Child, Pharmacy Records, Self            Home Care and Equipment/Supplies: Were Home Health Services Ordered?: Yes Name of Home Health Agency:: [facility] health Has Agency set up a time to come to your home?: No EMR reviewed for Home Health Orders: Orders present/patient has not received call (refer to CM for follow-up) (patient states his daughter will follow up with the home health agency.  Advised patient that the home health agency address and home health contact phone number listed on discharge summary) Any new equipment or medical supplies ordered?: No  Functional Questionnaire: Do you need assistance with bathing/showering or dressing?: No Do you need assistance with meal preparation?: No Do you need assistance with eating?: No Do you have difficulty maintaining continence: No Do you need assistance with getting out of bed/getting out of a chair/moving?: No Do you have difficulty managing or taking your medications?: Yes (patient states his daughter assists.)  Follow up appointments reviewed: PCP Follow-up appointment confirmed?: No (patient states his daughter will call and schedule his hospital follow up visit.) Specialist Hospital Follow-up appointment confirmed?: Yes Date of Specialist follow-up appointment?: 03/01/24 Follow-Up Specialty Provider:: clinton Fenton PA,  and follow up visit with Kaitlyn West, NP 03/15/24. Do you need transportation to your follow-up appointment?: No Do you understand care options if your condition(s) worsen?: Yes-patient verbalized understanding  SDOH Interventions Today    Flowsheet Row Most Recent Value  SDOH Interventions   Food Insecurity Interventions Intervention Not Indicated  Housing Interventions Intervention Not Indicated   Transportation Interventions Intervention Not Indicated  Utilities Interventions Intervention Not Indicated   Discussed and offered 30 day TOC program.  Patient declined. The patient has been provided with contact information for the care management team and has been advised to call with any health -related questions or concerns.  The patient verbalized understanding with current plan of care.  The patient is directed to their insurance card regarding availability of benefits coverage.    Arvin Seip RN, BSN, CCM [employer], Population Health Case Manager Phone: [phone]

## 2024-02-22 LAB — CULTURE, BLOOD (ROUTINE X 2)
Culture: NO GROWTH
Special Requests: ADEQUATE

## 2024-02-23 ENCOUNTER — Other Ambulatory Visit (HOSPITAL_BASED_OUTPATIENT_CLINIC_OR_DEPARTMENT_OTHER): Payer: Self-pay

## 2024-02-23 ENCOUNTER — Encounter (HOSPITAL_BASED_OUTPATIENT_CLINIC_OR_DEPARTMENT_OTHER): Payer: Self-pay | Admitting: Emergency Medicine

## 2024-02-23 ENCOUNTER — Ambulatory Visit: Payer: Self-pay

## 2024-02-23 ENCOUNTER — Other Ambulatory Visit: Payer: Self-pay

## 2024-02-23 ENCOUNTER — Emergency Department (HOSPITAL_BASED_OUTPATIENT_CLINIC_OR_DEPARTMENT_OTHER)
Admission: EM | Admit: 2024-02-23 | Discharge: 2024-02-23 | Disposition: A | Discharge status: Home / Self Care | Attending: Emergency Medicine | Admitting: Emergency Medicine

## 2024-02-23 DIAGNOSIS — N481 Balanitis: Secondary | ICD-10-CM | POA: Diagnosis present

## 2024-02-23 DIAGNOSIS — Z7982 Long term (current) use of aspirin: Secondary | ICD-10-CM | POA: Insufficient documentation

## 2024-02-23 DIAGNOSIS — Z79899 Other long term (current) drug therapy: Secondary | ICD-10-CM | POA: Insufficient documentation

## 2024-02-23 LAB — URINALYSIS, ROUTINE W REFLEX MICROSCOPIC
Bacteria, UA: NONE SEEN
Bilirubin Urine: NEGATIVE
Glucose, UA: NEGATIVE mg/dL
Ketones, ur: NEGATIVE mg/dL
Leukocytes,Ua: NEGATIVE
Nitrite: NEGATIVE
Protein, ur: 30 mg/dL — AB
Specific Gravity, Urine: 1.016 (ref 1.005–1.030)
pH: 6 (ref 5.0–8.0)

## 2024-02-23 MED ORDER — CLOTRIMAZOLE 1 % EX CREA
1.0000 | TOPICAL_CREAM | Freq: Two times a day (BID) | CUTANEOUS | 0 refills | Status: AC
  Filled 2024-02-23: qty 30, 15d supply, fill #0

## 2024-02-23 NOTE — ED Notes (Signed)
 DC paperwork given and verbally understood.

## 2024-02-23 NOTE — ED Provider Notes (Incomplete)
 I provided a substantive portion of the care of this patient.  I personally made/approved the management plan for this patient and take responsibility for the patient management. {Remember to document shared critical care using "edcritical" dot phrase:1}

## 2024-02-23 NOTE — Discharge Instructions (Addendum)
 Return if any problems.

## 2024-02-23 NOTE — Telephone Encounter (Signed)
Agree with ER.  Thanks.  

## 2024-02-23 NOTE — ED Triage Notes (Signed)
 Pt endorses penile swelling starting yesterday. Recent d/c on Saturday. Also endorses urinary retention. Pt reports Ubag when in hospital

## 2024-02-23 NOTE — Telephone Encounter (Signed)
 FYI Only or Action Required?: FYI only for provider: ED advised.  Patient was last seen in primary care on 09/28/2023 by Nathan Arlyss RAMAN, MD.  Called Nurse Triage reporting Groin Swelling.  Symptoms began today.  Interventions attempted: Nothing.  Symptoms are: unchanged.  Triage Disposition: Go to ED Now (Notify PCP)  Patient/caregiver understands and will follow disposition?: Yes     Copied from CRM #[id number]. Topic: Clinical - Red Word Triage >> Feb 23, 2024  9:20 AM [name] wrote: Red Word that prompted transfer to Nurse Triage: Patient's daughter, [name], stated the patient's private area is swollen and in pain. Reason for Disposition  [1] Unable to urinate (or only a few drops) > 4 hours AND [2] bladder feels very full (e.g., palpable bladder or strong urge to urinate)  Answer Assessment - Initial Assessment Questions Pt's dtr called for pt. States he was in the hospital for ecoli sepsis infection and has been doing great. She states he called her this am saying his penis is swollen and painful. Rn asked if his testicles were swollen too she said he didn't say. She couldn't answer many questions so she called him on the other line. Rn asked if he had pain with urination and he states he is unable to urinate, just dribbled. RN informed her that he needs to go to the ER. She stated understanding and that she would let him know that. No further triage questions asked at that time due to ER dispo.    1. SYMPTOM: What's the main symptom you're concerned about? (e.g., blood in semen, discharge or pus from penis, itching, pain, rash, swelling)     Penile swelling 2. LOCATION: Where is the swelling located?     penis 3. ONSET: When did swelling  start?     today 4. PAIN: Is there any pain? If Yes, ask: How bad is it?  (Scale 1-10; or mild, moderate, severe)     Daughter states has to be pretty bad for him to call and tell me about it 5. URINE: Any difficulty  passing urine? If Yes, ask: When was the last time?     Pt states he can't pee  Protocols used: Penis and Scrotum Symptoms-A-AH

## 2024-02-23 NOTE — ED Provider Notes (Signed)
 [place] EMERGENCY DEPARTMENT AT [facility] Provider Note   CSN: [id number] Arrival date & time: 02/23/24  1053     Patient presents with: Groin Swelling   [name] is a 89 y.o. male.   Pt complains of swelling to the foreskin of his penis.  Patient reports he was recently hospitalized and on antibiotics.  Patient had a E. coli infection to his bowel per his daughter.  Patient reports that his penis is swollen and irritated.  He is having some difficulty pushing his foreskin down because of the swelling.  Patient states that he tried putting a cream on it.  Patient states more swelling today than yesterday.  Patient denies any fever or chills he has not had any nausea or vomiting he denies any current UTI symptoms.  The history is provided by the patient. No language interpreter was used.       Prior to Admission medications   Medication Sig Start Date End Date Taking? Authorizing Provider  acetaminophen  (TYLENOL ) 500 MG tablet Take 500 mg by mouth as needed for mild pain.    [provider]  albuterol  (VENTOLIN  HFA) 108 (90 Base) MCG/ACT inhaler Inhale 1-2 puffs into the lungs every 6 (six) hours as needed for wheezing or shortness of breath. 02/03/23   Donnajean Lynwood DEL, PA-C  allopurinol  (ZYLOPRIM ) 100 MG tablet TAKE 1 TABLET BY MOUTH ONCE DAILY 12/20/23   [name], MD  aspirin  EC 81 MG tablet Take 1 tablet (81 mg total) by mouth daily. Swallow whole. 09/21/22   Wonda Sharper, MD  cephALEXin  (KEFLEX ) 500 MG capsule Take 4 capsules by mouth 30-60 min prior to ALL dental procedures. 02/08/24   Maccia, Melissa D, RPH-CPP  colchicine  0.6 MG tablet TAKE 1 TABLET BY MOUTH TWICE A DAY IF NEEDED THEN 1 DAILY IF NEEDED THEREAFTER FOR GOUT 12/24/23   [name], MD  cyanocobalamin  (,VITAMIN B-12,) 1000 MCG/ML injection 1000mcg IM every 28 days Patient taking differently: Inject 1,000 mcg into the muscle every 30 (thirty) days. 09/13/20   [name], MD  diphenhydrAMINE  HCl (BENADRYL  PO) Take 1 tablet by mouth as needed.    [provider]  furosemide  (LASIX ) 20 MG tablet TAKE 1 TABLET BY MOUTH ONCE DAILY 01/18/24   Cooper, Michael, MD  metoprolol  tartrate (LOPRESSOR ) 25 MG tablet Take 0.5 tablets (12.5 mg total) by mouth 2 (two) times daily. 01/20/24   Wonda Sharper, MD  omeprazole  (PRILOSEC ) 40 MG capsule Take 1 capsule (40 mg total) by mouth daily. 02/23/23   [name], MD  tamsulosin  (FLOMAX ) 0.4 MG CAPS capsule Take 1 capsule (0.4 mg total) by mouth daily. 09/28/23   [name], MD  warfarin (COUMADIN ) 4 MG tablet TAKE 1 TO 1 AND 1/2 TABLETS BY MOUTH DAILY OR AS DIRECTED BY COUMADIN  CLINIC Patient taking differently: Take 4-5 mg by mouth See admin instructions. Take 1 tablet by mouth every day except Sunday and Thursdays. On those days take 1.5 tablets by mouth. 01/18/24   Wonda Sharper, MD    Allergies: Penicillins, Atorvastatin , Pravastatin , Atrovent  nasal spray [ipratropium], Hydrocodone , Nsaids, Uloric  [febuxostat ], Vancomycin , Clarithromycin, and Doxycycline     Review of Systems  Genitourinary:  Positive for penile pain and penile swelling. Negative for decreased urine volume and penile discharge.  All other systems reviewed and are negative.   Updated Vital Signs BP (!) 126/97   Pulse (!) 112   Temp 98.9 F (37.2 C) (Oral)   Resp 20  SpO2 96%   Physical Exam Vitals and nursing note reviewed.  Constitutional:      Appearance: He is well-developed.  HENT:     Head: Normocephalic.  Cardiovascular:     Rate and Rhythm: Normal rate.  Pulmonary:     Effort: Pulmonary effort is normal.  Abdominal:     General: There is no distension.  Genitourinary:    Comments: Uncircumcised penis swelling to foreskin, grayish appearing rash, foreskin is retracted.  No evidence of any strangulation. Musculoskeletal:        General: Normal range of motion.     Cervical back: Normal range of motion.  Skin:     General: Skin is warm.  Neurological:     General: No focal deficit present.     Mental Status: He is alert and oriented to person, place, and time.     (all labs ordered are listed, but only abnormal results are displayed) Labs Reviewed - No data to display  EKG: None  Radiology: No results found.   Procedures   Medications Ordered in the ED - No data to display                                  Medical Decision Making Patient complains of pain and swelling to his penis.  Patient has recently been on antibiotics.  Amount and/or Complexity of Data Reviewed Labs: ordered. Decision-making details documented in ED Course.    Details: Labs ordered reviewed and interpreted UA shows no sign of infection  Risk Risk Details: Patient examined by Dr. Tonia.  Patient's daughter reports patient needs help at home.  Home health referral is placed.  Patient given a prescription for           Final diagnoses:  Balanitis    ED Discharge Orders          Ordered    Home Health        02/23/24 1252    Face-to-face encounter (required for Medicare/Medicaid patients)       Comments: I Darice Showers certify that this patient is under my care and that I, or a nurse practitioner or physician's assistant working with me, had a face-to-face encounter that meets the physician face-to-face encounter requirements with this patient on 02/23/2024. The encounter with the patient was in whole, or in part for the following medical condition(s) which is the primary reason for home health care (List medical condition): Pt needs wound care and observation   02/23/24 1252    clotrimazole  (CLOTRIMAZOLE  AF) 1 % cream  2 times daily        02/23/24 1353            Sugar applied to penis,  Pt had a large decrease in swelling. Pt given rx for clotrimazole    Mariene Dickerman K, PA-C 02/23/24 1402    Tonia Chew, MD 02/23/24 [phone]

## 2024-02-24 ENCOUNTER — Ambulatory Visit: Attending: Cardiovascular Disease | Admitting: Cardiovascular Disease

## 2024-02-24 ENCOUNTER — Other Ambulatory Visit: Payer: Self-pay

## 2024-02-24 ENCOUNTER — Encounter: Payer: Self-pay | Admitting: Cardiovascular Disease

## 2024-02-24 VITALS — BP 120/60 | HR 93 | Ht 66.0 in | Wt 196.0 lb

## 2024-02-24 DIAGNOSIS — Z952 Presence of prosthetic heart valve: Secondary | ICD-10-CM | POA: Diagnosis present

## 2024-02-24 DIAGNOSIS — I2510 Atherosclerotic heart disease of native coronary artery without angina pectoris: Secondary | ICD-10-CM | POA: Insufficient documentation

## 2024-02-24 DIAGNOSIS — I4819 Other persistent atrial fibrillation: Secondary | ICD-10-CM | POA: Insufficient documentation

## 2024-02-24 DIAGNOSIS — N1832 Chronic kidney disease, stage 3b: Secondary | ICD-10-CM | POA: Insufficient documentation

## 2024-02-24 NOTE — Assessment & Plan Note (Addendum)
 Normal valve function, most recent echo reviewed.  Follow-up 6 months with an APP and an echo prior to the visit.

## 2024-02-24 NOTE — Patient Instructions (Signed)
 Medication Instructions:  Please INCREASE your dose of lopressor  to 25 mg twice a day.   *If you need a refill on your cardiac medications before your next appointment, please call your pharmacy*  Lab Work: None.  If you have labs (blood work) drawn today and your tests are completely normal, you will receive your results only by: MyChart Message (if you have MyChart) OR A paper copy in the mail If you have any lab test that is abnormal or we need to change your treatment, we will call you to review the results.  Testing/Procedures: Your physician has requested that you have an echocardiogram in 6 months. Echocardiography is a painless test that uses sound waves to create images of your heart. It provides your doctor with information about the size and shape of your heart and how well your hearts chambers and valves are working. This procedure takes approximately one hour. There are no restrictions for this procedure. Please do NOT wear cologne, perfume, aftershave, or lotions (deodorant is allowed). Please arrive 15 minutes prior to your appointment time.  Please note: We ask at that you not bring children with you during ultrasound (echo/ vascular) testing. Due to room size and safety concerns, children are not allowed in the ultrasound rooms during exams. Our front office staff cannot provide observation of children in our lobby area while testing is being conducted. An adult accompanying a patient to their appointment will only be allowed in the ultrasound room at the discretion of the ultrasound technician under special circumstances. We apologize for any inconvenience.   Follow-Up: At [facility], you and your health needs are our priority.  As part of our continuing mission to provide you with exceptional heart care, our providers are all part of one team.  This team includes your primary Cardiologist (physician) and Advanced Practice Providers or APPs (Physician Assistants  and Nurse Practitioners) who all work together to provide you with the care you need, when you need it.  Your next appointment:   6 month(s)  Provider:   Lamarr Hummer, PA-C   Then, Ozell Fell, MD will plan to see you again in 1 year(s).   We recommend signing up for the patient portal called MyChart.  Sign up information is provided on this After Visit Summary.  MyChart is used to connect with patients for Virtual Visits (Telemedicine).  Patients are able to view lab/test results, encounter notes, upcoming appointments, etc.  Non-urgent messages can be sent to your provider as well.   To learn more about what you can do with MyChart, go to [email or web address].

## 2024-02-24 NOTE — Progress Notes (Signed)
 Cardiology Office Note:    Date:  02/24/2024   ID:  [name], DOB [birth year], MRN [id number]  PCP:  Nathan Arlyss RAMAN, MD   [name] Providers Cardiologist:  Nathan Fell, MD     Referring MD: Nathan Arlyss RAMAN, MD   Chief Complaint  Patient presents with   Atrial Fibrillation    History of Present Illness:    [name] is a 89 y.o. male with a hx of coronary artery disease, permanent atrial fibrillation, and aortic valve disease status post TAVR. TAVR was performed in January 2024. Most recent echo in February 2025 showed an LVEF 60-65%, mean transmitral gradient at 5 mmHg, and normal TAVR valve function with mean gradient 10 mmHg and no significant PVL.  The patient is here with his daughter today.  He reports that he has been doing pretty well from a cardiac perspective.  He had a recent hospitalization for weakness and was felt to have colitis.  He was noted to have a fast heart rate with his permanent atrial fibrillation and he was briefly treated with amiodarone  but this was discontinued.  Cardizem  was discontinued because of concerns about GI side effects.  The patient underwent serial troponin labs which were entirely normal.  His symptoms resolved and he has had no recurrent problems since hospital discharge.  He denies chest pain, chest pressure, heart palpitations, or shortness of breath.  Current Medications: Active Medications[1]   Allergies:   Penicillins, Atorvastatin , Pravastatin , Atrovent  nasal spray [ipratropium], Hydrocodone , Nsaids, Uloric  [febuxostat ], Vancomycin , Clarithromycin, and Doxycycline    ROS:   Please see the history of present illness.    All other systems reviewed and are negative.  EKGs/Labs/Other Studies Reviewed:    The following studies were reviewed today: Cardiac Studies & Procedures   ______________________________________________________________________________________________ CARDIAC CATHETERIZATION  CARDIAC  CATHETERIZATION 03/11/2022  Conclusion   Mid RCA lesion is 15% stenosed.   Ost 2nd Mrg to 2nd Mrg lesion is 50% stenosed.   2nd Mrg lesion is 90% stenosed.   Mid Cx lesion is 70% stenosed.   Prox RCA lesion is 90% stenosed.   Prox LAD to Mid LAD lesion is 50% stenosed.   A drug-eluting stent was successfully placed using a SYNERGY XD 3.0X24.   Post intervention, there is a 0% residual stenosis.   There is severe aortic valve stenosis.  1.  Severe aortic stenosis with mean transaortic gradient 42 mmHg, calculated aortic valve area 0.6 cm 2.  Two-vessel coronary artery disease with severe stenosis of the proximal RCA, treated with PCI using a 3.0 x 24 mm Synergy DES and severe stenosis of the OM branch of the circumflex with recommendations for medical therapy due to lesion complexity 3.  Moderate nonobstructive diffuse proximal to mid LAD stenosis estimated at 50%  Recommendations: Resume warfarin today, resume Lovenox  bridging tomorrow, clopidogrel  75 mg daily concurrent with warfarin anticoagulation for 6 months.  Will withhold aspirin  due to bleeding risk of triple therapy in this 89 year old gentleman.  Same-day PCI discharge protocol if criteria met.  Findings Coronary Findings Diagnostic  Dominance: Right  Left Anterior Descending Prox LAD to Mid LAD lesion is 50% stenosed.  First Diagonal Branch Vessel is large in size.  Second Diagonal Branch Vessel is small in size.  Third Diagonal Branch Vessel is small in size.  Left Circumflex Mid Cx lesion is 70% stenosed.  First Obtuse Marginal Branch Vessel is small in size.  Second Obtuse Marginal [employer] 2nd Mrg to 2nd Mrg  lesion is 50% stenosed. 2nd Mrg lesion is 90% stenosed. Vessel is the culprit lesion. The lesion was previously treated . Extends back to LCx (involves OM ostium)  Third Obtuse Marginal Branch Vessel is moderate in size.  Right Coronary Artery Vessel is moderate in size. Prox RCA lesion is 90%  stenosed. Mid RCA lesion is 15% stenosed. The lesion is irregular.  Intervention  Prox RCA lesion Stent CATH LAUNCHER 6FR JR4 guide catheter was inserted. Lesion crossed with guidewire using a WIRE COUGAR XT STRL 190CM. Pre-stent angioplasty was performed using a BALLN EMERGE MR 2.5X15. Maximum pressure:  8 atm. A drug-eluting stent was successfully placed using a SYNERGY XD 3.0X24. Maximum pressure: 14 atm. Post-stent angioplasty was performed using a BALLN [place] EMERGE MR 3.25X15. Maximum pressure:  16 atm. Post-Intervention Lesion Assessment The intervention was successful. Pre-interventional TIMI flow is 3. Post-intervention TIMI flow is 3. No complications occurred at this lesion. There is a 0% residual stenosis post intervention.   CARDIAC CATHETERIZATION  CARDIAC CATHETERIZATION 10/24/2015  Conclusion  Mid RCA lesion, 15 %stenosed.  Ost 2nd Mrg to 2nd Mrg lesion, 50 %stenosed.  Mid LAD lesion, 30 %stenosed.  2nd Mrg lesion, 100 %stenosed.  Post intervention, there is a 30% residual stenosis.  LV end diastolic pressure is normal.  1.  Occlusion of small, diffusely diseased OM2 branch. 2.  Mild, non-obstructive coronary artery disease involving the mid LAD and mid RCA. 3.  Normal left ventricular filling pressure (LVEDP 13 mmHg). 4.  Successful balloon angioplasty of OM2 using 2.0 x 12 mm balloon with improvement in stenosis from 100% to 30% with restoration of TIMI-3 flow.  Stent was not placed due to the small vessel size with diffuse disease extending to the ostium.  Plan: 1.  Dual antiplatelet therapy for at least 1 month.  Given that patient is on warfarin as well, would recommend switching from ticagrelor  (loaded in cath lab) to clopidogrel  to reduce risk for bleeding complications. 2.  Restart warfarin tomorrow. 3.  Aggressive secondary prevention. 4.  Continue IV hydration x 6 hours, with close follow-up of renal status.  Findings Coronary Findings Diagnostic   Dominance: Right  Left Anterior Descending  First Diagonal Branch Vessel is large in size.  Second Diagonal Branch Vessel is small in size.  Third Diagonal Branch Vessel is small in size.  Left Circumflex  First Obtuse Marginal Branch Vessel is small in size.  Second Obtuse Marginal Branch  Culprit lesion.  Third Obtuse Marginal Branch Vessel is moderate in size.  Right Coronary Artery Vessel is moderate in size. The lesion is irregular.  Intervention  2nd Mrg lesion Angioplasty Lesion crossed with guidewire. Angioplasty alone was performed using a BALLOON EMERGE MR 2.0X12. Maximum pressure: 10 atm. There is no pre-interventional antegrade distal flow (TIMI 0).  The post-interventional distal flow is normal (TIMI 3). The intervention was successful . No complications occurred at this lesion. Stent was not placed due to small size of OM2 and diffuse disease extending from ostium to distal portion of the vessel. There is a 30% residual stenosis post intervention.     ECHOCARDIOGRAM  ECHOCARDIOGRAM COMPLETE 04/26/2023  Narrative ECHOCARDIOGRAM REPORT    Patient Name:   [name] Date of Exam: 04/26/2023 Medical Rec #:  [id number]      Height:       66.0 in Accession #:    [id number]     Weight:       204.0 lb Date of Birth:  02/13/1931  BSA:          2.017 m Patient Age:    89 years       BP:           138/64 mmHg Patient Gender: M              HR:           70 bpm. Exam Location:  [address]  Procedure: 2D Echo, 3D Echo, Cardiac Doppler and Color Doppler  Indications:    S/P TAVR (transcatheter aortic valve replacement) [[id number]]  History:        Patient has prior history of Echocardiogram examinations, most recent 05/18/2022. CAD, Aortic Valve Disease, Arrythmias:Bradycardia and Atrial Fibrillation; Risk Factors:Diabetes, Dyslipidemia, Hypertension and Sleep Apnea. Aortic Valve: 23 mm Sapien prosthetic, stented (TAVR) valve is present in the  aortic position. Procedure Date: 04/14/22.  Sonographer:    Lauraine Pilot RDCS Referring Phys: [id number] JILL D MCDANIEL  IMPRESSIONS   1. Left ventricular ejection fraction, by estimation, is 60 to 65%. The left ventricle has normal function. The left ventricle has no regional wall motion abnormalities. Left ventricular diastolic parameters are indeterminate. 2. Right ventricular systolic function is normal. The right ventricular size is normal. There is moderately elevated pulmonary artery systolic pressure. 3. Left atrial size was moderately dilated. 4. Mean gradient in diastole 5 peak 19 mmHg MVA PT1/2 2.3 cm2 at HR 81 bpm. The mitral valve is abnormal. Mild mitral valve regurgitation. Mild mitral stenosis. Severe mitral annular calcification. 5. Post TAVR with 23 mm Sapien 3 valve no PVL mean gradient 10 peak 19 mmHg DVI 0.55 AVA 2.4 cm2 . The aortic valve has been repaired/replaced. Aortic valve regurgitation is not visualized. No aortic stenosis is present. There is a 23 mm Sapien prosthetic (TAVR) valve present in the aortic position. Procedure Date: 04/14/22. 6. The inferior vena cava is normal in size with greater than 50% respiratory variability, suggesting right atrial pressure of 3 mmHg.  FINDINGS Left Ventricle: Left ventricular ejection fraction, by estimation, is 60 to 65%. The left ventricle has normal function. The left ventricle has no regional wall motion abnormalities. The left ventricular internal cavity size was normal in size. There is no left ventricular hypertrophy. Left ventricular diastolic parameters are indeterminate.  Right Ventricle: The right ventricular size is normal. No increase in right ventricular wall thickness. Right ventricular systolic function is normal. There is moderately elevated pulmonary artery systolic pressure. The tricuspid regurgitant velocity is 3.44 m/s, and with an assumed right atrial pressure of 3 mmHg, the estimated right ventricular systolic  pressure is 50.3 mmHg.  Left Atrium: Left atrial size was moderately dilated.  Right Atrium: Right atrial size was normal in size.  Pericardium: There is no evidence of pericardial effusion.  Mitral Valve: Mean gradient in diastole 5 peak 19 mmHg MVA PT1/2 2.3 cm2 at HR 81 bpm. The mitral valve is abnormal. There is severe thickening of the mitral valve leaflet(s). There is severe calcification of the mitral valve leaflet(s). Severe mitral annular calcification. Mild mitral valve regurgitation. Mild mitral valve stenosis. MV peak gradient, 17.0 mmHg. The mean mitral valve gradient is 4.5 mmHg.  Tricuspid Valve: The tricuspid valve is normal in structure. Tricuspid valve regurgitation is mild . No evidence of tricuspid stenosis.  Aortic Valve: Post TAVR with 23 mm Sapien 3 valve no PVL mean gradient 10 peak 19 mmHg DVI 0.55 AVA 2.4 cm2. The aortic valve has been repaired/replaced. Aortic valve regurgitation is not visualized. No aortic  stenosis is present. Aortic valve mean gradient measures 10.0 mmHg. Aortic valve peak gradient measures 19.1 mmHg. Aortic valve area, by VTI measures 2.27 cm. There is a 23 mm Sapien prosthetic, stented (TAVR) valve present in the aortic position. Procedure Date: 04/14/22.  Pulmonic Valve: The pulmonic valve was normal in structure. Pulmonic valve regurgitation is not visualized. No evidence of pulmonic stenosis.  Aorta: The aortic root is normal in size and structure.  Venous: The inferior vena cava is normal in size with greater than 50% respiratory variability, suggesting right atrial pressure of 3 mmHg.  IAS/Shunts: The interatrial septum appears to be lipomatous. No atrial level shunt detected by color flow Doppler.   LEFT VENTRICLE PLAX 2D LVIDd:         3.87 cm LVIDs:         2.77 cm LV PW:         0.81 cm LV IVS:        0.99 cm LVOT diam:     2.29 cm   3D Volume EF: LV SV:         108       3D EF:        60 % LV SV Index:   54        LV EDV:        114 ml LVOT Area:     4.12 cm  LV ESV:       46 ml LV SV:        68 ml  RIGHT VENTRICLE RV S prime:     8.31 cm/s TAPSE (M-mode): 1.8 cm  LEFT ATRIUM             Index        RIGHT ATRIUM           Index LA diam:        4.95 cm 2.45 cm/m   RA Area:     18.80 cm LA Vol (A2C):   79.2 ml 39.27 ml/m  RA Volume:   47.80 ml  23.70 ml/m LA Vol (A4C):   86.3 ml 42.79 ml/m LA Biplane Vol: 83.5 ml 41.40 ml/m AORTIC VALVE AV Area (Vmax):    2.41 cm AV Area (Vmean):   2.39 cm AV Area (VTI):     2.27 cm AV Vmax:           218.60 cm/s AV Vmean:          148.000 cm/s AV VTI:            0.476 m AV Peak Grad:      19.1 mmHg AV Mean Grad:      10.0 mmHg LVOT Vmax:         128.00 cm/s LVOT Vmean:        86.050 cm/s LVOT VTI:          0.262 m LVOT/AV VTI ratio: 0.55  AORTA Ao Root diam: 3.09 cm Ao Asc diam:  3.63 cm  MITRAL VALVE              TRICUSPID VALVE MV Area (PHT): 3.44 cm   TR Peak grad:   47.3 mmHg MV Area VTI:   2.06 cm   TR Vmax:        344.00 cm/s MV Peak grad:  17.0 mmHg MV Mean grad:  4.5 mmHg   SHUNTS MV Vmax:       2.06 m/s   Systemic VTI:  0.26 m MV Vmean:  91.0 cm/s  Systemic Diam: 2.29 cm  Maude Emmer MD Electronically signed by Maude Emmer MD Signature Date/Time: 04/26/2023/11:26:34 AM    Final      CT SCANS  CT CORONARY MORPH W/CTA COR W/SCORE 03/17/2022  Addendum 03/17/2022 12:37 PM ADDENDUM REPORT: 03/17/2022 12:34  CLINICAL DATA:  60M with severe aortic stenosis being evaluated for a TAVR procedure.  EXAM: Cardiac TAVR CT  TECHNIQUE: The patient was scanned on a [employer]. A 120 kV retrospective scan was triggered in the descending thoracic aorta at 111 HU's. Gantry rotation speed was 250 msecs and collimation was .6 mm. No beta blockade or nitro were given. The 3D data set was reconstructed in 5% intervals of the R-R cycle. Systolic and diastolic phases were analyzed on a dedicated work station using MPR, MIP and  VRT modes. The patient received 100 cc of contrast.  FINDINGS: Aortic Root:  Aortic valve: Trileaflet  Aortic valve calcium  score: 2627  Aortic annulus:  Diameter: 28mm x 21mm  Perimeter: 75mm  Area: 424 mm^2  Calcifications: Severe calcifications adjacent to left coronary cusp  Coronary height: Min Left - 18mm, Min Right - 11mm  Sinotubular height: Left cusp - 25mm; Right cusp - 18mm; Noncoronary cusp - 22mm  LVOT (as measured 3 mm below the annulus):  Diameter: 24mm x 21mm  Area: 391 mm^2  Calcifications: Severe calcifications inferior to left coronary cusp  Aortic sinus width: Left cusp - 35mm; Right cusp - 31mm; Noncoronary cusp - 32mm  Sinotubular junction width: 32mm x 29mm  Optimum Fluoroscopic Angle for Delivery: LAO 13 CRA 23  Cardiac:  Right atrium: Mild enlargement  Right ventricle: Normal size  Pulmonary arteries: Normal size  Pulmonary veins: Normal configuration  Left atrium: Moderate enlargement.  PFO  Left ventricle: Normal size  Pericardium: Normal thickness  Coronary arteries: S/p proximal RCA stent  Mitral valve: Severe mitral annular calcification  IMPRESSION: 1. Trileaflet aortic valve with severe calcifications (AV calcium  score 2627)  2. Aortic annulus measures 28mm x 21mm in diameter with perimeter 75mm and area 424 mm^2. Severe annular calcification adjacent to left coronary cusp extending into LVOT. Annular measurements are suitable for delivery of 23mm [place] Sapien 3 valve  3. Low coronary height to RCA (11mm). Sufficient coronary height to left main (18mm)  4.  Optimum Fluoroscopic Angle for Delivery: LAO 13 CRA 23  5.  Severe mitral annular calcifications  6.  PFO  7.  S/p proximal RCA stent   Electronically Signed By: Lonni Nanas M.D. On: 03/17/2022 12:34  Narrative EXAM: OVER-READ INTERPRETATION  CT CHEST  The following report is a limited chest CT over-read performed by radiologist Dr.  Selinda Blue of [facility] on 03/17/2022. This over-read does not include interpretation of cardiac or coronary anatomy or pathology. The cardiac CTA interpretation by the cardiologist is attached.  COMPARISON:  04/28/2006 chest CT angiogram. 05/08/2021 chest radiograph.  FINDINGS: Please see the separate concurrent chest CT angiogram report for details.  IMPRESSION: Please see the separate concurrent chest CT angiogram report for details.  Electronically Signed: By: Selinda DELENA Blue M.D. On: 03/17/2022 11:22     ______________________________________________________________________________________________      EKG:        Recent Labs: 02/16/2024: B Natriuretic Peptide 191.3 02/17/2024: ALT 17; TSH 1.676 02/19/2024: BUN 14; Creatinine, Ser 1.20; Hemoglobin 14.2; Magnesium  1.9; Platelets 99; Potassium 3.7; Sodium 134  Recent Lipid Panel    Component Value Date/Time   CHOL 187 09/28/2023 1343  TRIG 205.0 (H) 09/28/2023 1343   HDL 26.00 (L) 09/28/2023 1343   CHOLHDL 7 09/28/2023 1343   VLDL 41.0 (H) 09/28/2023 1343   LDLCALC 120 (H) 09/28/2023 1343   LDLDIRECT 95.0 05/16/2019 0923     Risk Assessment/Calculations:    CHA2DS2-VASc Score = 5   This indicates a 7.2% annual risk of stroke. The patient's score is based upon: CHF History: 0 HTN History: 1 Diabetes History: 1 Stroke History: 0 Vascular Disease History: 1 Age Score: 2 Gender Score: 0               Physical Exam:    VS:  BP 120/60 (BP Location: Right Arm, Patient Position: Sitting, Cuff Size: Normal)   Pulse 93   Ht 5' 6 (1.676 m)   Wt 196 lb (88.9 kg)   SpO2 98%   BMI 31.64 kg/m     Wt Readings from Last 3 Encounters:  02/24/24 196 lb (88.9 kg)  02/17/24 198 lb 6.6 oz (90 kg)  12/29/23 197 lb (89.4 kg)     GEN:  Well nourished, well developed overweight elderly male in no acute distress HEENT: Normal NECK: No JVD; No carotid bruits LYMPHATICS: No lymphadenopathy CARDIAC:  Irregularly irregular, distant heart sounds, no audible murmur RESPIRATORY:  Clear to auscultation without rales, wheezing or rhonchi  ABDOMEN: Soft, non-tender, non-distended MUSCULOSKELETAL:  No edema; No deformity  SKIN: Warm and dry NEUROLOGIC:  Alert and oriented x 3 PSYCHIATRIC:  Normal affect   Assessment & Plan Persistent atrial fibrillation (HCC) Diltiazem  discontinued secondary to constipation. Recommend increase metoprolol  to 25 mg BID now that he's off diltiazem  and resting HR about 90 bpm. Continue warfarin.  S/P TAVR (transcatheter aortic valve replacement) Normal valve function, most recent echo reviewed.  Follow-up 6 months with an APP and an echo prior to the visit. Coronary artery disease involving native coronary artery of native heart without angina pectoris On ASA 81 mg daily.  Continue current therapy. Stage 3b chronic kidney disease (HCC) Labs stable. Recent creatinine 1.2.  Stable.            Medication Adjustments/Labs and Tests Ordered: Current medicines are reviewed at length with the patient today.  Concerns regarding medicines are outlined above.  Orders Placed This Encounter  Procedures   ECHOCARDIOGRAM COMPLETE   Meds ordered this encounter  Medications   metoprolol  tartrate (LOPRESSOR ) 25 MG tablet    Sig: Take 1 tablet (25 mg total) by mouth 2 (two) times daily.    Dispense:  180 tablet    Refill:  3    Patient Instructions  Medication Instructions:  Please INCREASE your dose of lopressor  to 25 mg twice a day.   *If you need a refill on your cardiac medications before your next appointment, please call your pharmacy*  Lab Work: None.  If you have labs (blood work) drawn today and your tests are completely normal, you will receive your results only by: MyChart Message (if you have MyChart) OR A paper copy in the mail If you have any lab test that is abnormal or we need to change your treatment, we will call you to review the  results.  Testing/Procedures: Your physician has requested that you have an echocardiogram in 6 months. Echocardiography is a painless test that uses sound waves to create images of your heart. It provides your doctor with information about the size and shape of your heart and how well your hearts chambers and valves are working. This procedure takes  approximately one hour. There are no restrictions for this procedure. Please do NOT wear cologne, perfume, aftershave, or lotions (deodorant is allowed). Please arrive 15 minutes prior to your appointment time.  Please note: We ask at that you not bring children with you during ultrasound (echo/ vascular) testing. Due to room size and safety concerns, children are not allowed in the ultrasound rooms during exams. Our front office staff cannot provide observation of children in our lobby area while testing is being conducted. An adult accompanying a patient to their appointment will only be allowed in the ultrasound room at the discretion of the ultrasound technician under special circumstances. We apologize for any inconvenience.   Follow-Up: At [facility], you and your health needs are our priority.  As part of our continuing mission to provide you with exceptional heart care, our providers are all part of one team.  This team includes your primary Cardiologist (physician) and Advanced Practice Providers or APPs (Physician Assistants and Nurse Practitioners) who all work together to provide you with the care you need, when you need it.  Your next appointment:   6 month(s)  Provider:   Lamarr Hummer, PA-C   Then, Nathan Fell, MD will plan to see you again in 1 year(s).   We recommend signing up for the patient portal called MyChart.  Sign up information is provided on this After Visit Summary.  MyChart is used to connect with patients for Virtual Visits (Telemedicine).  Patients are able to view lab/test results, encounter notes,  upcoming appointments, etc.  Non-urgent messages can be sent to your provider as well.   To learn more about what you can do with MyChart, go to [email or web address].              SignedOzell Fell, MD  02/24/2024 11:49 AM    Sublette [name]     [1]  Current Meds  Medication Sig   cephALEXin  (KEFLEX ) 500 MG capsule Take 4 capsules by mouth 30-60 min prior to ALL dental procedures.   metoprolol  tartrate (LOPRESSOR ) 25 MG tablet Take 1 tablet (25 mg total) by mouth 2 (two) times daily.   tamsulosin  (FLOMAX ) 0.4 MG CAPS capsule Take 1 capsule (0.4 mg total) by mouth daily. (Patient taking differently: Take 0.4 mg by mouth as needed.)

## 2024-02-24 NOTE — Assessment & Plan Note (Addendum)
 Labs stable. Recent creatinine 1.2.  Stable.

## 2024-02-25 ENCOUNTER — Telehealth: Payer: Self-pay | Admitting: Family Medicine

## 2024-02-25 ENCOUNTER — Other Ambulatory Visit: Payer: Self-pay

## 2024-02-25 NOTE — Telephone Encounter (Signed)
 Copied from CRM #[id number]. Topic: General - Other >> Feb 24, 2024  4:30 PM Rosina BIRCH wrote: Reason for CRM: patient daughter called stating the patient just got out of the hospital and she want the provider to look at the hospital note because she needs a home health provider to take care of his issue this time. Patient daughter need the home health nurse as soon as possible [phone]

## 2024-02-28 ENCOUNTER — Other Ambulatory Visit: Payer: Self-pay

## 2024-02-28 NOTE — Telephone Encounter (Signed)
 What services does he need currently?  Does he already have an agency set up for [facility] or any other services?    If so, we could ask to get it added on.    If he doesn't have any agency set up, then let me know.  Thanks.

## 2024-02-29 ENCOUNTER — Other Ambulatory Visit: Payer: Self-pay

## 2024-03-01 ENCOUNTER — Ambulatory Visit (HOSPITAL_COMMUNITY): Admitting: Physician Assistant

## 2024-03-01 NOTE — Telephone Encounter (Signed)
 Called and spoke to daughter on dpr. States that orders were never placed by hospital. He was released by PT. Patient needs help with bating and ADL's .

## 2024-03-01 NOTE — Addendum Note (Signed)
 Addended by: [name] on: 03/01/2024 03:08 PM   Modules accepted: Orders

## 2024-03-01 NOTE — Telephone Encounter (Signed)
 Called let daughter know that orders have been placed. She will call if any other questions.

## 2024-03-01 NOTE — Telephone Encounter (Signed)
I put in the order.  Thanks. 

## 2024-03-03 NOTE — Addendum Note (Signed)
 Addended by: [name] on: 03/03/2024 10:48 AM   Modules accepted: Orders

## 2024-03-04 ENCOUNTER — Other Ambulatory Visit: Payer: Self-pay | Admitting: Family Medicine

## 2024-03-04 DIAGNOSIS — K219 Gastro-esophageal reflux disease without esophagitis: Secondary | ICD-10-CM

## 2024-03-06 ENCOUNTER — Ambulatory Visit: Admitting: Primary Care

## 2024-03-06 ENCOUNTER — Encounter: Payer: Self-pay | Admitting: Primary Care

## 2024-03-06 ENCOUNTER — Ambulatory Visit: Admitting: Family Medicine

## 2024-03-06 ENCOUNTER — Ambulatory Visit: Payer: Self-pay | Admitting: Primary Care

## 2024-03-06 ENCOUNTER — Ambulatory Visit (INDEPENDENT_AMBULATORY_CARE_PROVIDER_SITE_OTHER)
Admission: RE | Admit: 2024-03-06 | Discharge: 2024-03-06 | Disposition: A | Discharge status: Home / Self Care | Source: Ambulatory Visit | Attending: Primary Care | Admitting: Primary Care

## 2024-03-06 VITALS — BP 114/74 | HR 80 | Temp 98.0°F | Wt 191.2 lb

## 2024-03-06 DIAGNOSIS — N1832 Chronic kidney disease, stage 3b: Secondary | ICD-10-CM

## 2024-03-06 DIAGNOSIS — E119 Type 2 diabetes mellitus without complications: Secondary | ICD-10-CM | POA: Diagnosis not present

## 2024-03-06 DIAGNOSIS — E538 Deficiency of other specified B group vitamins: Secondary | ICD-10-CM | POA: Diagnosis not present

## 2024-03-06 DIAGNOSIS — Z20828 Contact with and (suspected) exposure to other viral communicable diseases: Secondary | ICD-10-CM | POA: Diagnosis not present

## 2024-03-06 DIAGNOSIS — R202 Paresthesia of skin: Secondary | ICD-10-CM | POA: Diagnosis not present

## 2024-03-06 DIAGNOSIS — R051 Acute cough: Secondary | ICD-10-CM

## 2024-03-06 DIAGNOSIS — Z20822 Contact with and (suspected) exposure to covid-19: Secondary | ICD-10-CM

## 2024-03-06 LAB — CBC WITH DIFFERENTIAL/PLATELET
Basophils Absolute: 0.1 K/uL (ref 0.0–0.1)
Basophils Relative: 0.9 % (ref 0.0–3.0)
Eosinophils Absolute: 0.1 K/uL (ref 0.0–0.7)
Eosinophils Relative: 0.9 % (ref 0.0–5.0)
HCT: 45.1 % (ref 39.0–52.0)
Hemoglobin: 15.3 g/dL (ref 13.0–17.0)
Lymphocytes Relative: 14.2 % (ref 12.0–46.0)
Lymphs Abs: 0.9 K/uL (ref 0.7–4.0)
MCHC: 33.9 g/dL (ref 30.0–36.0)
MCV: 84.8 fl (ref 78.0–100.0)
Monocytes Absolute: 1.1 K/uL — ABNORMAL HIGH (ref 0.1–1.0)
Monocytes Relative: 18.4 % — ABNORMAL HIGH (ref 3.0–12.0)
Neutro Abs: 4 K/uL (ref 1.4–7.7)
Neutrophils Relative %: 65.6 % (ref 43.0–77.0)
Platelets: 169 K/uL (ref 150.0–400.0)
RBC: 5.31 Mil/uL (ref 4.22–5.81)
RDW: 15.6 % — ABNORMAL HIGH (ref 11.5–15.5)
WBC: 6.1 K/uL (ref 4.0–10.5)

## 2024-03-06 LAB — POCT INFLUENZA A/B
Influenza A, POC: NEGATIVE
Influenza B, POC: NEGATIVE

## 2024-03-06 LAB — VITAMIN B12: Vitamin B-12: >1500 pg/mL — ABNORMAL HIGH (ref 211–911)

## 2024-03-06 LAB — POC COVID19 BINAXNOW: SARS Coronavirus 2 Ag: NEGATIVE

## 2024-03-06 LAB — HEMOGLOBIN A1C: Hgb A1c MFr Bld: 6.9 % — ABNORMAL HIGH (ref 4.6–6.5)

## 2024-03-06 LAB — VITAMIN D 25 HYDROXY (VIT D DEFICIENCY, FRACTURES): VITD: 18.75 ng/mL — ABNORMAL LOW (ref 30.00–100.00)

## 2024-03-06 MED ORDER — PREDNISONE 20 MG PO TABS: ORAL_TABLET | ORAL | 0 refills | Status: AC

## 2024-03-06 MED ORDER — CYANOCOBALAMIN 1000 MCG/ML IJ SOLN
1000.0000 ug | Freq: Once | INTRAMUSCULAR | Status: AC
  Administered 2024-03-06: 1000 ug via INTRAMUSCULAR

## 2024-03-06 NOTE — Progress Notes (Signed)
 "  Subjective:    Patient ID: [name], male    DOB: 09-24-1930, 89 y.o.   MRN: [id number]  [name] is a very pleasant 89 y.o. male patient of Dr. Cleatus with a history of hypertension, CAD, chronic atrial fibrillation, peripheral neuropathy, gout, CKD, B12 deficiency who presents today to discuss paresthesias and acute cough.  His daughter joins us  today who helps to provide information for HPI.   His paresthesias are located to the bilateral hands at the palms and finger tips which began years ago. He does have difficulty griping an object with one hand as he feels the object slide through. He underwent carpal tunnel testing about 1 year ago and tested negative. He denies unilateral weakness, changes in sleep, dizziness. His daughter believes his vitamin D  is low causing his symptoms. He is also managed on B12 injections, last one was in late October 2025.   Symptom onset 3 days ago with fatigue and a dry cough. He's also noticed right frontal lobe pressure. He's not feeling well at all. He has been exposed to several people tested positive for influenza and COVID-19 infection. He's taken Delsym last night which helped. He is a prior smoker, quit 55 years ago.    Review of Systems  Constitutional:  Positive for fatigue. Negative for chills and fever.  HENT:  Positive for congestion and sinus pressure.   Respiratory:  Positive for cough.   Neurological:  Positive for numbness.         Past Medical History:  Diagnosis Date   Arthritis    Bradycardia    a. nocturnal with pauses overnight on tele likely due to OSA 10/2015.   CKD (chronic kidney disease), stage III (HCC)    Coronary artery disease    a. prev nonobst. b. LHC 10/24/15: occlusion of small diffusely diseased OM2 s/p balloon angioplasty, mild nonobstructive disease of mLAD and mRCA, normal LVEDP   Diabetes mellitus without complication (HCC)    01/02/21 Pt's daughter states patient does not  have diabetes.  A1C  07/23/20 was 7.2, patientr had been on medication.   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 Wonda & Dr. 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  Tobacco Use   Smoking status: Former    Current packs/day: 0.00    Average packs/day: 0.1 packs/day for 4.0 years (0.5 ttl pk-yrs)    Types: Cigarettes  Start date: 03/16/1965    Quit date: 03/16/1969    Years since quitting: 55.0    Passive exposure: Never   Smokeless tobacco: Former   Tobacco comments:    chewed when I smoked; smoked 1 pack/week for about 4 years, quit 1971  Vaping Use   Vaping status: Never Used  Substance and Sexual Activity   Alcohol use: Not Currently    Alcohol/week: 0.0 standard drinks of alcohol    Comment: 09/09/2017  Quit all alcohol in 1970's   Drug use: Never   Sexual activity: Not on file  Other Topics Concern   Not on file  Social History Narrative   Lives in [place], married 1953, widowed after 62 years   Retired naval architect, still does aeronautical engineer.   He is not routinely exercising.   1-2 cups caffeine daily     Social Drivers of Health   Tobacco Use: Medium Risk (03/06/2024)   Patient History    Smoking Tobacco Use: Former    Smokeless Tobacco Use: Former    Passive Exposure: Never  Programmer, Applications: Low Risk (12/08/2023)   Overall Financial Resource Strain (CARDIA)    Difficulty of Paying Living Expenses: Not hard at all  Food Insecurity: No Food Insecurity (02/21/2024)   Epic    Worried About Programme Researcher, Broadcasting/film/video in the Last Year: Never true    Ran Out of Food in the Last Year: Never true  Transportation Needs: No Transportation Needs (02/21/2024)   Epic    Lack of Transportation (Medical): No    Lack of Transportation (Non-Medical): No  Physical Activity: Inactive (12/08/2023)   Exercise Vital Sign    Days of Exercise per Week: 0 days    Minutes of Exercise per Session: 0 min  Stress: No Stress Concern Present (12/08/2023)   [employer] of Occupational Health - Occupational Stress Questionnaire    Feeling of Stress: Not at all  Social Connections: Moderately Isolated (02/17/2024)   Social Connection and Isolation Panel    Frequency of Communication with Friends and Family: More than three times a week    Frequency of Social Gatherings with Friends and Family: Once a week    Attends Religious Services: Never    Database Administrator or Organizations: Yes    Attends Banker Meetings: Never    Marital Status: Widowed  Intimate Partner Violence: Not At Risk (02/21/2024)   Epic    Fear of Current or Ex-Partner: No    Emotionally Abused: No    Physically Abused: No    Sexually Abused: No  Depression (PHQ2-9): Low Risk (02/21/2024)   Depression (PHQ2-9)    PHQ-2 Score: 0  Alcohol Screen: Low Risk (12/08/2023)   Alcohol Screen    Last Alcohol Screening Score (AUDIT): 0  Housing: Unknown (02/21/2024)   Epic    Unable to Pay for Housing in the Last Year: No    Number of Times Moved in the Last Year: Not on file    Homeless in the Last Year: No  Utilities: Not  At Risk (02/21/2024)   Epic    Threatened with loss of utilities: No  Health Literacy: Adequate Health Literacy (12/08/2023)   B1300 Health Literacy    Frequency of need for help with medical instructions: Rarely    Past Surgical History:  Procedure Laterality Date   AMPUTATION Right 03/03/2019   Procedure: RIGHT 2ND TOE AMPUTATION;  Surgeon: Harden Jerona GAILS, MD;  Location: [facility] OR;  Service: Orthopedics;  Laterality: Right;   AMPUTATION Right 01/03/2021   Procedure: RIGHT 3RD TOE AMPUTATION;  Surgeon: Harden Jerona GAILS, MD;  Location: [facility] OR;  Service: Orthopedics;  Laterality: Right;   BALLOON DILATION N/A 10/22/2014   Procedure: BALLOON DILATION;  Surgeon: Lupita FORBES Commander, MD;  Location: [facility] ENDOSCOPY;  Service: Endoscopy;  Laterality: N/A;   CARDIAC CATHETERIZATION  07/2008   CARDIAC CATHETERIZATION N/A 10/24/2015   Procedure: Left Heart Cath and Coronary Angiography;  Surgeon: Lonni Hanson, MD;  Location: [facility] INVASIVE CV LAB;  Service: Cardiovascular;  Laterality: N/A;   CARDIAC CATHETERIZATION N/A 10/24/2015   Procedure: Coronary Balloon Angioplasty;  Surgeon: Lonni Hanson, MD;  Location: [facility] INVASIVE CV LAB;  Service: Cardiovascular;  Laterality: N/A;   Carotid Dopplers  09/2009   no sig extracranial stenosis and vertebral arteries had antegrade flow   CATARACT EXTRACTION W/ INTRAOCULAR LENS  IMPLANT, BILATERAL Bilateral    CORONARY STENT INTERVENTION N/A 03/11/2022   Procedure: CORONARY STENT INTERVENTION;  Surgeon: Wonda Sharper, MD;  Location: [facility] INVASIVE CV LAB;  Service: Cardiovascular;  Laterality: N/A;   ESOPHAGOGASTRODUODENOSCOPY (EGD) WITH ESOPHAGEAL DILATION     I've had it stretched 3 times (10/23/2015)   ESOPHAGOGASTRODUODENOSCOPY (EGD) WITH ESOPHAGEAL DILATION  06/2011   /medical hx above  (09/09/2017)   ESOPHAGOGASTRODUODENOSCOPY (EGD) WITH PROPOFOL  N/A 10/22/2014   Procedure: ESOPHAGOGASTRODUODENOSCOPY (EGD) WITH PROPOFOL ;  Surgeon: Lupita FORBES Commander, MD;  Location: [facility]COPY;   Service: Endoscopy;  Laterality: N/A;   FINGER SURGERY Right 02/1964   cut little finger off; had it reattached   I & D EXTREMITY Right 09/10/2017   Procedure: RIGHT FOOT DEBRIDEMENT;  Surgeon: Harden Jerona GAILS, MD;  Location: [facility] OR;  Service: Orthopedics;  Laterality: Right;   INTRAOPERATIVE TRANSTHORACIC ECHOCARDIOGRAM N/A 04/14/2022   Procedure: INTRAOPERATIVE TRANSTHORACIC ECHOCARDIOGRAM;  Surgeon: Wonda Sharper, MD;  Location: [facility] INVASIVE CV LAB;  Service: Open Heart Surgery;  Laterality: N/A;   MRI of brain  09/2009   chronic microvascular ischemia   OSA Sleep Study     per Dr. Chalice   RIGHT/LEFT HEART CATH AND CORONARY ANGIOGRAPHY N/A 03/11/2022   Procedure: RIGHT/LEFT HEART CATH AND CORONARY ANGIOGRAPHY;  Surgeon: Wonda Sharper, MD;  Location: [facility] INVASIVE CV LAB;  Service: Cardiovascular;  Laterality: N/A;   SHOULDER ARTHROSCOPY W/ ROTATOR CUFF REPAIR Right 2008   TRANSCATHETER AORTIC VALVE REPLACEMENT, TRANSFEMORAL Left 04/14/2022   Procedure: Transcatheter Aortic Valve Replacement, Transfemoral;  Surgeon: Wonda Sharper, MD;  Location[facility]VAS[facility] CV LAB;  Service: Open Heart Surgery;  Laterality: Left;    Family History  Problem Relation Age of Onset   Stomach cancer Mother        died in 72's    Hypertension Father        died in his 19's pna likely dementia   Dementia Father    Pneumonia Father    Heart disease Brother        S/P CABG   Sleep apnea Brother    Sleep apnea Daughter    Hypertension Other    Prostate cancer Neg Hx    Colon cancer Neg Hx     Allergies[1]  Medications Ordered Prior to Encounter[2]  BP 114/74 (BP Location: Left Arm, Patient Position: Sitting, Cuff Size: Normal)   Pulse 80   Temp 98 F (36.7 C) (Temporal)   Wt 191 lb 3.2 oz (86.7 kg)   SpO2 97%   BMI 30.86 kg/m  Objective:   Physical Exam Constitutional:      Appearance: He  is not ill-appearing.  HENT:     Right Ear: There is impacted cerumen.     Left Ear: Tympanic  membrane and ear canal normal.  Cardiovascular:     Rate and Rhythm: Normal rate and regular rhythm.  Pulmonary:     Effort: Pulmonary effort is normal.     Breath sounds: Examination of the right-upper field reveals rhonchi. Examination of the left-upper field reveals wheezing. Examination of the right-lower field reveals rhonchi. Wheezing and rhonchi present.  Skin:    General: Skin is warm and dry.     Physical Exam        Assessment & Plan:  Paresthesias Assessment & Plan: Differentials include diabetic neuropathy, B12 deficiency, hyperglycemia.  Checking labs today including B12, CBC, A1c, vitamin D   TSH reviewed from December 2025, normal range  Orders: -     Vitamin B12 -     CBC with Differential/Platelet -     Hemoglobin A1c  Suspected COVID-19 virus infection -     POC COVID-19 BinaxNow  Exposure to influenza -     POCT Influenza A/B  Acute cough Assessment & Plan: Rapid influenza and COVID-19 test negative today.  Will obtain chest x-ray given results on exam today. Start prednisone  20 mg tablets. Take 2 tablets by mouth once daily in the morning for 5 days.  Fortunately, vital signs are stable and he is acceptable for outpatient treatment Await results  Orders: -     DG Chest 2 View -     predniSONE ; Take 2 tablets by mouth once daily in the morning for 5 days.  Dispense: 10 tablet; Refill: 0  B12 deficiency Assessment & Plan: Vitamin B12  level pending. Injection for vitamin B12 provided today.  Orders: -     Cyanocobalamin  -     Vitamin B12  Diabetes mellitus without complication (HCC) -     Hemoglobin A1c  Vitamin B12 deficiency Assessment & Plan: Vitamin B12  level pending. Injection for vitamin B12 provided today.   Stage 3b chronic kidney disease (HCC) -     VITAMIN D  25 Hydroxy (Vit-D Deficiency, Fractures)    Assessment and Plan Assessment & Plan         Comer MARLA Gaskins, NP       [1]  Allergies Allergen  Reactions   Penicillins Shortness Of Breath and Swelling    Patient tolerated Rocephin  injection 2019  PATIENT HAS HAD A PCN REACTION WITH IMMEDIATE RASH, FACIAL/TONGUE/THROAT SWELLING, SOB, OR LIGHTHEADEDNESS WITH HYPOTENSION:  #  #  YES  #  Has patient had a PCN reaction causing severe rash involving mucus membranes or skin necrosis: NO Has patient had a PCN reaction that required hospitalization NO Has patient had a PCN reaction occurring within the last 10 years: NO If all of the above answers are NO, then may proceed with Cephalosporin use.    Atorvastatin  Other (See Comments)    No energy, just felt bad     Pravastatin  Other (See Comments)    myalgias   Atrovent  Nasal Spray [Ipratropium] Other (See Comments)    Dizzy with use   Hydrocodone  Itching and Other (See Comments)    swimmy headed after use   Nsaids Other (See Comments)    Due to renal function   Uloric  [Febuxostat ] Other (See Comments)    Would avoid, worsening gout sx with use.    Vancomycin  Other (See Comments)    Red man syndrome- tolerated with slow run and benadryl  use  Clarithromycin Diarrhea   Doxycycline  Other (See Comments)    GI upset   [2]  Current Outpatient Medications on File Prior to Visit  Medication Sig Dispense Refill   acetaminophen  (TYLENOL ) 500 MG tablet Take 500 mg by mouth as needed for mild pain.     albuterol  (VENTOLIN  HFA) 108 (90 Base) MCG/ACT inhaler Inhale 1-2 puffs into the lungs every 6 (six) hours as needed for wheezing or shortness of breath. 8 g 0   allopurinol  (ZYLOPRIM ) 100 MG tablet TAKE 1 TABLET BY MOUTH ONCE DAILY 90 tablet 2   aspirin  EC 81 MG tablet Take 1 tablet (81 mg total) by mouth daily. Swallow whole. 90 tablet 3   cephALEXin  (KEFLEX ) 500 MG capsule Take 4 capsules by mouth 30-60 min prior to ALL dental procedures. 4 capsule 2   clotrimazole  (CLOTRIMAZOLE  AF) 1 % cream Apply 1 Application topically 2 (two) times daily. 30 g 0   colchicine  0.6 MG tablet TAKE 1  TABLET BY MOUTH TWICE A DAY IF NEEDED THEN 1 DAILY IF NEEDED THEREAFTER FOR GOUT 180 tablet 0   cyanocobalamin  (,VITAMIN B-12,) 1000 MCG/ML injection 1000mcg IM every 28 days (Patient taking differently: Inject 1,000 mcg into the muscle every 30 (thirty) days.) 1 mL    diphenhydrAMINE  HCl (BENADRYL  PO) Take 1 tablet by mouth as needed.     furosemide  (LASIX ) 20 MG tablet TAKE 1 TABLET BY MOUTH ONCE DAILY 90 tablet 0   metoprolol  tartrate (LOPRESSOR ) 25 MG tablet Take 1 tablet (25 mg total) by mouth 2 (two) times daily. 180 tablet 3   omeprazole  (PRILOSEC ) 40 MG capsule TAKE 1 CAPSULE BY MOUTH EVERY DAY 90 capsule 2   tamsulosin  (FLOMAX ) 0.4 MG CAPS capsule Take 1 capsule (0.4 mg total) by mouth daily. (Patient taking differently: Take 0.4 mg by mouth as needed.) 90 capsule 3   warfarin (COUMADIN ) 4 MG tablet TAKE 1 TO 1 AND 1/2 TABLETS BY MOUTH DAILY OR AS DIRECTED BY COUMADIN  CLINIC (Patient taking differently: Take 4-5 mg by mouth See admin instructions. Take 1 tablet by mouth every day except Sunday and Thursdays. On those days take 1.5 tablets by mouth.) 100 tablet 1   No current facility-administered medications on file prior to visit.   "

## 2024-03-06 NOTE — Patient Instructions (Signed)
 Stop by the lab and xray prior to leaving today. I will notify you of your results once received.   Start prednisone  20 mg tablets. Take 2 tablets by mouth once daily in the morning for 5 days.  It was a pleasure meeting you!

## 2024-03-06 NOTE — Assessment & Plan Note (Signed)
 Vitamin B12  level pending. Injection for vitamin B12 provided today.

## 2024-03-06 NOTE — Assessment & Plan Note (Addendum)
 Differentials include diabetic neuropathy, B12 deficiency, hyperglycemia.  Checking labs today including B12, CBC, A1c, vitamin D   TSH reviewed from December 2025, normal range

## 2024-03-06 NOTE — Assessment & Plan Note (Signed)
 Rapid influenza and COVID-19 test negative today.  Will obtain chest x-ray given results on exam today. Start prednisone  20 mg tablets. Take 2 tablets by mouth once daily in the morning for 5 days.  Fortunately, vital signs are stable and he is acceptable for outpatient treatment Await results

## 2024-03-08 ENCOUNTER — Other Ambulatory Visit: Payer: Self-pay | Admitting: Primary Care

## 2024-03-08 ENCOUNTER — Ambulatory Visit: Payer: Self-pay

## 2024-03-08 ENCOUNTER — Ambulatory Visit

## 2024-03-08 DIAGNOSIS — R051 Acute cough: Secondary | ICD-10-CM

## 2024-03-08 DIAGNOSIS — J111 Influenza due to unidentified influenza virus with other respiratory manifestations: Secondary | ICD-10-CM

## 2024-03-08 MED ORDER — BENZONATATE 200 MG PO CAPS: 200.0000 mg | ORAL_CAPSULE | Freq: Three times a day (TID) | ORAL | 0 refills | Status: AC | PRN

## 2024-03-08 MED ORDER — OSELTAMIVIR PHOSPHATE 75 MG PO CAPS: 75.0000 mg | ORAL_CAPSULE | Freq: Two times a day (BID) | ORAL | 0 refills | Status: AC

## 2024-03-08 NOTE — Telephone Encounter (Signed)
 [name], daughter and made her aware of new prescriptions sent in.  She verbalized understanding.

## 2024-03-08 NOTE — Telephone Encounter (Signed)
 Noted.  Prescription for Tamiflu  and Tessalon  Perles sent to pharmacy.  Please notify patient's daughter.

## 2024-03-08 NOTE — Telephone Encounter (Signed)
 FYI Only or Action Required?: Action required by provider: clinical question for provider, update on patient condition, and request for tamiflu  or cough medicine.  Patient was last seen in primary care on 03/06/2024 by Gretta Comer POUR, NP.  Called Nurse Triage reporting Influenza.  Symptoms began several days ago.  Interventions attempted: Rest, hydration, or home remedies.  Symptoms are: unchanged.  Triage Disposition: Call PCP Within 24 Hours  Patient/caregiver understands and will follow disposition?: No, wishes to speak with PCP     Copied from CRM #[id number]. Topic: Clinical - Red Word Triage >> Mar 08, 2024 10:20 AM Charolett L wrote: [facility] that prompted transfer to Nurse Triage: patient tested positive for type a flu Reason for Disposition  Patient is HIGH RISK (e.g., 65 years and older, pregnant, HIV+, or chronic medical condition)  Answer Assessment - Initial Assessment Questions Pt was seen on Monday and tested for the flu. Pt's dtr was at [facility] for something else and provider saw her father and advised to retest the patient as it's been about 3 days post symptoms that tests are becoming positive. She tested him at home and he was positive for Flu A. She requested Tamiflu . RN advised that medication needs to be started within 48 hours of symptom onset to be beneficial. She is asking if they can at least send him in some cough medicine as that is his biggest complaint. She denies any higher acuity symptoms.    1. SYMPTOMS: What is your main symptom or concern? (e.g., cough, fever, shortness of breath, muscle aches)     cough 2. ONSET: When did the symptoms start?      Sunday 3. COUGH: Do you have a cough? If Yes, ask: How bad is the cough?       Yes- mod 4. FEVER: Do you have a fever? If Yes, ask: What is your temperature, how was it measured, and when did it start?     denies 5. BREATHING DIFFICULTY: Are you having any difficulty breathing? (e.g., normal;  shortness of breath, wheezing, unable to speak)      denies 6. OTHER SYMPTOMS: Do you have any other symptoms?  (e.g., chills, fatigue, headache, loss of smell or taste, muscle pain, sore throat)     Headache, sinus pressure, body aches 7. INFLUENZA SUSPECTED: Why do you think you have influenza? (e.g., positive flu self-test at home, symptoms after exposure).     Home test positive 8. HIGH RISK FOR COMPLICATIONS: Do you have any chronic medical problems? (e.g., asthma, heart or lung disease, obesity, weak immune system)       HTN, CAD, afib, age  Protocols used: Influenza (Flu) Suspected-A-AH

## 2024-03-15 ENCOUNTER — Ambulatory Visit: Admitting: *Deleted

## 2024-03-15 ENCOUNTER — Ambulatory Visit: Admitting: Cardiology

## 2024-03-15 DIAGNOSIS — I482 Chronic atrial fibrillation, unspecified: Secondary | ICD-10-CM | POA: Diagnosis present

## 2024-03-15 DIAGNOSIS — Z952 Presence of prosthetic heart valve: Secondary | ICD-10-CM | POA: Diagnosis present

## 2024-03-15 LAB — POCT INR: INR: 1.8 — AB (ref 2.0–3.0)

## 2024-03-15 NOTE — Patient Instructions (Signed)
 Description   INR 1.8; Today take 1.5 tablets then START taking warfarin 1 tablet daily except for 1.5 tablets on Sundays, Tuesdays, and Thursdays.  Recheck INR in 3 weeks. Call if any new medications or changes [phone].  IF YOU NEED TO HOLD YOUR WARFARIN FOR ANY PROCEDURE PLEASE FAX CLEARANCE FORM TO [phone]

## 2024-03-15 NOTE — Progress Notes (Signed)
 Description   INR 1.8; Today take 1.5 tablets then START taking warfarin 1 tablet daily except for 1.5 tablets on Sundays, Tuesdays, and Thursdays.  Recheck INR in 3 weeks. Call if any new medications or changes [phone].  IF YOU NEED TO HOLD YOUR WARFARIN FOR ANY PROCEDURE PLEASE FAX CLEARANCE FORM TO [phone]

## 2024-04-04 ENCOUNTER — Ambulatory Visit: Attending: Cardiology | Admitting: Pharmacist

## 2024-04-04 DIAGNOSIS — I482 Chronic atrial fibrillation, unspecified: Secondary | ICD-10-CM | POA: Diagnosis present

## 2024-04-04 DIAGNOSIS — Z952 Presence of prosthetic heart valve: Secondary | ICD-10-CM | POA: Insufficient documentation

## 2024-04-04 LAB — POCT INR: INR: 2.4 (ref 2.0–3.0)

## 2024-04-04 NOTE — Progress Notes (Signed)
 Description   INR 2.4 ; Continue taking warfarin 1 tablet daily except for 1.5 tablets on Sundays, Tuesdays, and Thursdays.  Recheck INR in 4 weeks. Call if any new medications or changes [phone].  IF YOU NEED TO HOLD YOUR WARFARIN FOR ANY PROCEDURE PLEASE FAX CLEARANCE FORM TO [phone]

## 2024-04-04 NOTE — Patient Instructions (Signed)
 Description   INR 2.4 ; Continue taking warfarin 1 tablet daily except for 1.5 tablets on Sundays, Tuesdays, and Thursdays.  Recheck INR in 4 weeks. Call if any new medications or changes [phone].  IF YOU NEED TO HOLD YOUR WARFARIN FOR ANY PROCEDURE PLEASE FAX CLEARANCE FORM TO [phone]

## 2024-04-05 ENCOUNTER — Ambulatory Visit
Admission: RE | Admit: 2024-04-05 | Discharge: 2024-04-05 | Disposition: A | Discharge status: Home / Self Care | Source: Ambulatory Visit | Attending: Emergency Medicine | Admitting: Emergency Medicine

## 2024-04-05 ENCOUNTER — Ambulatory Visit

## 2024-04-05 VITALS — BP 113/61 | HR 78 | Temp 98.0°F | Resp 19

## 2024-04-05 DIAGNOSIS — K644 Residual hemorrhoidal skin tags: Secondary | ICD-10-CM

## 2024-04-05 MED ORDER — HYDROCORTISONE (PERIANAL) 2.5 % EX CREA: 1.0000 | TOPICAL_CREAM | Freq: Two times a day (BID) | CUTANEOUS | 0 refills | Status: AC

## 2024-04-05 NOTE — Discharge Instructions (Addendum)
 On exam there is a hemorrhoid present however due to bleeding and use of Coumadin  please monitor closely and if bleeding has not begun to improve within 24 hours please go to the nearest emergency department for further management  Apply hydrocortisone  cream twice daily over the affected area, may use as needed if area quite clean  May hold warm compresses or sit in warm water for comfort  May sit on cushioning to help support any pain  At any point if you were to begin to see dark tarry stools please go to the nearest emergency department immediately

## 2024-04-05 NOTE — ED Triage Notes (Signed)
 Patient complains reports mid abdominal pain after having a bowel movement x 2 days ago. Patient reports after that  he started having bright red blood with clots. Patient reports today after bowel movement the blood was black. Patient denies pain at this time. Patient has not taken anything for symptoms.

## 2024-04-05 NOTE — ED Provider Notes (Signed)
 " [name]    CSN: [id number] Arrival date & time: 04/05/24  1049      History   Chief Complaint Chief Complaint  Patient presents with   Hemorrhoids    Entered by patient   Rectal Bleeding    HPI BUFFORD HELMS is a 89 y.o. male.   Patient presents for evaluation of rectal bleeding beginning 2 days ago described as bright red blood with clots.  Initially had abdominal pain when symptoms began, resolved after defecation.  Denies presence of black tarry stools.  Has not attempted treatment of symptoms.  Takes Coumadin .  Past Medical History:  Diagnosis Date   Arthritis    Bradycardia    a. nocturnal with pauses overnight on tele likely due to OSA 10/2015.   CKD (chronic kidney disease), stage III (HCC)    Coronary artery disease    a. prev nonobst. b. LHC 10/24/15: occlusion of small diffusely diseased OM2 s/p balloon angioplasty, mild nonobstructive disease of mLAD and mRCA, normal LVEDP   Diabetes mellitus without complication (HCC)    01/02/21 Pt's daughter states patient does not  have diabetes.  A1C 07/23/20 was 7.2, patientr had been on medication.   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 Wonda & Dr. 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   PND (post-nasal drip) 07/17/2021   Lower urinary tract symptoms (LUTS) 09/16/2020   Gouty arthritis of left foot 06/20/2020   Diabetic peripheral neuropathy (HCC) 10/04/2019   Vitamin B12 deficiency 05/17/2019   Idiopathic chronic gout, unspecified site, without tophus (tophi) 10/06/2017   Coronary artery disease due to lipid rich plaque    Chronic atrial fibrillation (HCC)    CKD (chronic kidney disease), stage III (HCC)    Hyperlipidemia    Chest pain of uncertain etiology 10/24/2015   ETD (eustachian tube dysfunction) 05/24/2015   Chronic anticoagulation 09/25/2014   Esophageal stricture from GERD 06/08/2011   OSA on CPAP 09/22/2010   Gout 08/15/2008   OBESITY 08/15/2008   Primary hypertension 08/15/2008   Coronary artery disease with exertional angina 08/15/2008   GASTROESOPHAGEAL REFLUX DISEASE 08/15/2008    Past Surgical History:  Procedure Laterality Date   AMPUTATION Right 03/03/2019   Procedure: RIGHT 2ND TOE AMPUTATION;  Surgeon: Harden Jerona GAILS, MD;  Location: [facility] OR;  Service: Orthopedics;  Laterality: Right;   AMPUTATION Right 01/03/2021  Procedure: RIGHT 3RD TOE AMPUTATION;  Surgeon: Harden Jerona GAILS, MD;  Location: [facility] OR;  Service: Orthopedics;  Laterality: Right;   BALLOON DILATION N/A 10/22/2014   Procedure: BALLOON DILATION;  Surgeon: Lupita FORBES Commander, MD;  Location: [facility] END[facility]COPY;  Service: Endoscopy;  Laterality: N/A;   CARDIAC CATHETERIZATION  07/2008   CARDIAC CATHETERIZATION N/A 10/24/2015   Procedure: Left Heart Cath and Coronary Angiography;  Surgeon: Lonni Hanson, MD;  Location: [facility] INVASIVE CV LAB;  Service: Cardiovascular;  Laterality: N/A;   CARDIAC CATHETERIZATION N/A 10/24/2015   Procedure: Coronary Balloon  Angioplasty;  Surgeon: Lonni Hanson, MD;  Location: [facility] INVASIVE CV LAB;  Service: Cardiovascular;  Laterality: N/A;   Carotid Dopplers  09/2009   no sig extracranial stenosis and vertebral arteries had antegrade flow   CATARACT EXTRACTION W/ INTRAOCULAR LENS  IMPLANT, BILATERAL Bilateral    CORONARY STENT INTERVENTION N/A 03/11/2022   Procedure: CORONARY STENT INTERVENTION;  Surgeon: Wonda Sharper, MD;  Location: [facility] INVASIVE CV LAB;  Service: Cardiovascular;  Laterality: N/A;   ESOPHAGOGASTRODUODENOSCOPY (EGD) WITH ESOPHAGEAL DILATION     I've had it stretched 3 times (10/23/2015)   ESOPHAGOGASTRODUODENOSCOPY (EGD) WITH ESOPHAGEAL DILATION  06/2011   /medical hx above  (09/09/2017)   ESOPHAGOGASTRODUODENOSCOPY (EGD) WITH PROPOFOL  N/A 10/22/2014   Procedure: ESOPHAGOGASTRODUODENOSCOPY (EGD) WITH PROPOFOL ;  Surgeon: Lupita FORBES Commander, MD;  Location: [facility];  Service: Endoscopy;  Laterality: N/A;   FINGER SURGERY Right 02/1964   cut little finger off; had it reattached   I & D EXTREMITY Right 09/10/2017   Procedure: RIGHT FOOT DEBRIDEMENT;  Surgeon: Harden Jerona GAILS, MD;  Location: [facility] OR;  Service: Orthopedics;  Laterality: Right;   INTRAOPERATIVE TRANSTHORACIC ECHOCARDIOGRAM N/A 04/14/2022   Procedure: INTRAOPERATIVE TRANSTHORACIC ECHOCARDIOGRAM;  Surgeon: Wonda Sharper, MD;  Location: [facility] INVASIVE CV LAB;  Service: Open Heart Surgery;  Laterality: N/A;   MRI of brain  09/2009   chronic microvascular ischemia   OSA Sleep Study     per Dr. Chalice   RIGHT/LEFT HEART CATH AND CORONARY ANGIOGRAPHY N/A 03/11/2022   Procedure: RIGHT/LEFT HEART CATH AND CORONARY ANGIOGRAPHY;  Surgeon: Wonda Sharper, MD;  Location: [facility] INVASIVE CV LAB;  Service: Cardiovascular;  Laterality: N/A;   SHOULDER ARTHROSCOPY W/ ROTATOR CUFF REPAIR Right 2008   TRANSCATHETER AORTIC VALVE REPLACEMENT, TRANSFEMORAL Left 04/14/2022   Procedure: Transcatheter Aortic Valve Replacement, Transfemoral;  Surgeon: Wonda Sharper,  MD;  Location: [facility] INVASIVE CV LAB;  Service: Open Heart Surgery;  Laterality: Left;       Home Medications    Prior to Admission medications  Medication Sig Start Date End Date Taking? Authorizing Provider  hydrocortisone  (ANUSOL -HC) 2.5 % rectal cream Place 1 Application rectally 2 (two) times daily. 04/05/24  Yes Antavious Spanos R, NP  acetaminophen  (TYLENOL ) 500 MG tablet Take 500 mg by mouth as needed for mild pain.    [provider]  albuterol  (VENTOLIN  HFA) 108 (90 Base) MCG/ACT inhaler Inhale 1-2 puffs into the lungs every 6 (six) hours as needed for wheezing or shortness of breath. 02/03/23   Donnajean Lynwood DEL, PA-C  allopurinol  (ZYLOPRIM ) 100 MG tablet TAKE 1 TABLET BY MOUTH ONCE DAILY 12/20/23   [name], MD  aspirin  EC 81 MG tablet Take 1 tablet (81 mg total) by mouth daily. Swallow whole. 09/21/22   Wonda Sharper, MD  benzonatate  (TESSALON ) 200 MG capsule Take 1 capsule (200 mg total) by mouth 3 (three) times daily as needed for cough. 03/08/24   Clark, Katherine K, NP  cephALEXin  (KEFLEX ) 500 MG capsule Take 4 capsules by mouth 30-60 min prior to ALL dental procedures. 02/08/24   Maccia, Melissa D, RPH-CPP  clotrimazole  (CLOTRIMAZOLE  AF) 1 % cream Apply 1 Application topically 2 (two) times daily. 02/23/24   Sofia, Leslie K, PA-C  colchicine  0.6 MG tablet TAKE 1 TABLET BY MOUTH TWICE A DAY IF NEEDED THEN 1 DAILY IF NEEDED THEREAFTER FOR GOUT 12/24/23   [name], MD  cyanocobalamin  (,VITAMIN B-12,) 1000 MCG/ML injection 1000mcg IM every 28 days Patient taking differently: Inject 1,000 mcg into the muscle every 30 (thirty) days. 09/13/20   [name], MD  diphenhydrAMINE  HCl (BENADRYL  PO) Take 1 tablet by mouth as needed.    [provider]  furosemide  (LASIX ) 20 MG tablet TAKE 1 TABLET BY MOUTH ONCE DAILY 01/18/24   Cooper, Michael, MD  metoprolol  tartrate (LOPRESSOR ) 25 MG tablet Take 1 tablet (25 mg total) by mouth 2 (two) times daily.  02/24/24   Wonda Sharper, MD  omeprazole  (PRILOSEC ) 40 MG capsule TAKE 1 CAPSULE BY MOUTH EVERY DAY 03/06/24   [name], MD  predniSONE  (DELTASONE ) 20 MG tablet Take 2 tablets by mouth once daily in the morning for 5 days. 03/06/24   Gretta Comer POUR, NP  tamsulosin  (FLOMAX ) 0.4 MG CAPS capsule Take 1 capsule (0.4 mg total) by mouth daily. Patient taking differently: Take 0.4 mg by mouth as needed. 09/28/23   [name], MD  warfarin (COUMADIN ) 4 MG tablet TAKE 1 TO 1 AND 1/2 TABLETS BY MOUTH DAILY OR AS DIRECTED BY COUMADIN  CLINIC Patient taking differently: Take 4-5 mg by mouth See admin instructions. Take 1 tablet by mouth every day except Sunday and Thursdays. On those days take 1.5 tablets by mouth. 01/18/24   Wonda Sharper, MD    Family History Family History  Problem Relation Age of Onset   Stomach cancer Mother        died in 75's    Hypertension Father        died in his 47's pna likely dementia   Dementia Father    Pneumonia Father    Heart disease Brother        S/P CABG   Sleep apnea Brother    Sleep apnea Daughter    Hypertension Other    Prostate cancer Neg Hx    Colon cancer Neg Hx     Social History Social History[1]   Allergies   Penicillins, Atorvastatin , Pravastatin , Atrovent  nasal spray [ipratropium], Hydrocodone , Nsaids, Uloric  [febuxostat ], Vancomycin , Clarithromycin, and Doxycycline    Review of Systems Review of Systems   Physical Exam Triage Vital Signs ED Triage Vitals  Encounter Vitals Group     BP 04/05/24 1120 113/61     Girls Systolic BP Percentile --      Girls Diastolic BP Percentile --      Boys Systolic BP Percentile --      Boys Diastolic BP Percentile --      Pulse Rate 04/05/24 1120 78     Resp 04/05/24 1120 19     Temp 04/05/24 1120 98 F (36.7 C)     Temp Source 04/05/24 1120 Oral     SpO2 04/05/24 1120 96 %     Weight --      Height --      Head Circumference --      Peak Flow --      Pain Score  04/05/24 1124 0     Pain Loc --  Pain Education --      Exclude from Growth Chart --    No data found.  Updated Vital Signs BP 113/61 (BP Location: Right Arm)   Pulse 78   Temp 98 F (36.7 C) (Oral)   Resp 19   SpO2 96%   Visual Acuity Right Eye Distance:   Left Eye Distance:   Bilateral Distance:    Right Eye Near:   Left Eye Near:    Bilateral Near:     Physical Exam Constitutional:      Appearance: Normal appearance.  Eyes:     Extraocular Movements: Extraocular movements intact.  Pulmonary:     Effort: Pulmonary effort is normal.  Abdominal:     Tenderness: There is no abdominal tenderness. There is no guarding.  Genitourinary:     Comments: Hemorrhoid present at about 6 oclock, dark red blood around the anal room, no fissure or tear noted Neurological:     Mental Status: He is alert and oriented to person, place, and time.      UC Treatments / Results  Labs (all labs ordered are listed, but only abnormal results are displayed) Labs Reviewed - No data to display  EKG   Radiology No results found.  Procedures Procedures (including critical care time)  Medications Ordered in UC Medications - No data to display  Initial Impression / Assessment and Plan / UC Course  I have reviewed the triage vital signs and the nursing notes.  Pertinent labs & imaging results that were available during my care of the patient were reviewed by me and considered in my medical decision making (see chart for details).  External hemorrhoid  Present on exam as well as rectal bleeding, no abdominal pain or tenderness noted, stable for outpatient management however advised to monitor very closely, no signs of strangulation, prescribed hydrocortisone  cream and advised to monitor closely and if no improvement with rectal bleeding patient to go to the nearest emergency department for further evaluation and management as he is taking a blood thinner Final Clinical  Impressions(s) / UC Diagnoses   Final diagnoses:  External hemorrhoid     Discharge Instructions      On exam there is a hemorrhoid present however due to bleeding and use of Coumadin  please monitor closely and if bleeding has not begun to improve within 24 hours please go to the nearest emergency department for further management  Apply hydrocortisone  cream twice daily over the affected area, may use as needed if area quite clean  May hold warm compresses or sit in warm water for comfort  May sit on cushioning to help support any pain  At any point if you were to begin to see dark tarry stools please go to the nearest emergency department immediately   ED Prescriptions     Medication Sig Dispense Auth. Provider   hydrocortisone  (ANUSOL -HC) 2.5 % rectal cream Place 1 Application rectally 2 (two) times daily. 15 g Teresa Shelba SAUNDERS, NP      PDMP not reviewed this encounter.    [1]  Social History Tobacco Use   Smoking status: Former    Current packs/day: 0.00    Average packs/day: 0.1 packs/day for 4.0 years (0.5 ttl pk-yrs)    Types: Cigarettes    Start date: 03/16/1965    Quit date: 03/16/1969    Years since quitting: 55.0    Passive exposure: Never   Smokeless tobacco: Former   Tobacco comments:    chewed when I smoked;  smoked 1 pack/week for about 4 years, quit 1971  Vaping Use   Vaping status: Never Used  Substance Use Topics   Alcohol use: Not Currently    Alcohol/week: 0.0 standard drinks of alcohol    Comment: 09/09/2017  Quit all alcohol in 1970's   Drug use: Never     Teresa Shelba SAUNDERS, NP 04/05/24 1429  "

## 2024-04-20 ENCOUNTER — Encounter: Payer: Self-pay | Admitting: Orthopedic Surgery

## 2024-04-20 ENCOUNTER — Ambulatory Visit: Admitting: Orthopedic Surgery

## 2024-04-20 DIAGNOSIS — L97511 Non-pressure chronic ulcer of other part of right foot limited to breakdown of skin: Secondary | ICD-10-CM

## 2024-04-20 DIAGNOSIS — B351 Tinea unguium: Secondary | ICD-10-CM

## 2024-04-20 NOTE — Progress Notes (Signed)
 "  Office Visit Note   Patient: [name]           Date of Birth: 07-13-1930           MRN: [id number] Visit Date: 04/20/2024              Requested by: [name], MD [address] [place],  [place] [address] PCP: [name], MD  Chief Complaint  Patient presents with   Right Foot - Wound Check      HPI: Discussed the use of AI scribe software for clinical note transcription with the patient, who gave verbal consent to proceed.  History of Present Illness [name] is a 89 year old male with limited mobility who presents for evaluation of a right foot callus with ulceration.  Approximately two weeks ago, he developed a callus on the right foot, first noticing the lesion in the morning. He reports a small amount of bleeding under the toenail, which he attributes to the nail digging into the skin. He denies recent trauma, pain, swelling, or signs of infection. No specific treatments have been used for the callus prior to this visit.  He has limited flexibility and is unable to trim his own toenails, relying on family members for assistance with foot care and footwear. He expresses difficulty with shoes that bend easily and is interested in footwear with adequate cushioning and support to alleviate pressure on the affected area. No issues are reported with the contralateral foot.     Assessment & Plan: Visit Diagnoses:  1. Right foot ulcer, limited to breakdown of skin (HCC)   2. Onychomycosis     Plan: Assessment and Plan Assessment & Plan Right foot callus with ulceration New callus and ulcer under right second metatarsal head, no infection. - Debrided ulcer to healthy tissue with ten blade after consent. - Recommended Achilles tendon stretching and ankle dorsiflexion exercises. - Advised stiff-soled footwear, specifically New Balance walking sneakers. - Recommended Doctor Scholl's donut cushion for pressure offloading. - Scheduled follow-up in three  months.  Onychomycosis Bilateral thickened, discolored onychomycotic nails, unable to trim due to limited mobility. - Trimmed eight onychomycotic nails without complication.      Follow-Up Instructions: Return in about 3 months (around 07/18/2024).   Ortho Exam  Patient is alert, oriented, no adenopathy, well-dressed, normal affect, normal respiratory effort. Physical Exam EXTREMITIES: Thickened discolored onychomycotic nails bilaterally. Ulcer beneath right second metatarsal head, measuring 5mm pre-debridement and 10mm post-debridement.   After informed consent a 10 blade knife was used to debride the skin and soft tissue back to healthy viable granulation tissue.  Ulcer measures 5 mm in diameter prior to debridement and 10 mm in diameter after debridement   Imaging: No results found. No images are attached to the encounter.  Labs: Lab Results  Component Value Date   HGBA1C 6.9 (H) 03/06/2024   HGBA1C 6.8 (H) 09/28/2023   HGBA1C 7.1 (H) 02/08/2023   ESRSEDRATE 17 (H) 09/09/2017   CRP 1.1 (H) 09/09/2017   LABURIC 6.9 09/28/2023   LABURIC 6.9 02/08/2023   LABURIC 8.2 (H) 09/10/2022   REPTSTATUS 02/22/2024 FINAL 02/17/2024   GRAMSTAIN  09/10/2017    MODERATE WBC PRESENT,BOTH PMN AND MONONUCLEAR NO ORGANISMS SEEN    CULT  02/17/2024    NO GROWTH 5 DAYS Performed at [facility], [address], [place], [place] [address]      Lab Results  Component Value Date   ALBUMIN 2.9 (  L) 02/17/2024   ALBUMIN 3.3 (L) 02/16/2024   ALBUMIN 4.2 09/28/2023    Lab Results  Component Value Date   MG 1.9 02/19/2024   MG 2.0 02/18/2024   MG 1.8 02/17/2024   Lab Results  Component Value Date   VD25OH 18.75 (L) 03/06/2024    No results found for: PREALBUMIN    Latest Ref Rng & Units 03/06/2024   12:27 PM 02/19/2024    4:32 AM 02/18/2024    3:48 AM  CBC EXTENDED  WBC 4.0 - 10.5 K/uL 6.1  6.7  7.8   RBC 4.22 - 5.81 Mil/uL 5.31  4.96  4.88   Hemoglobin 13.0 - 17.0  g/dL 84.6  85.7  85.9   HCT 39.0 - 52.0 % 45.1  41.9  41.0   Platelets 150.0 - 400.0 K/uL 169.0  99  90   NEUT# 1.4 - 7.7 K/uL 4.0  4.9  6.0   Lymph# 0.7 - 4.0 K/uL 0.9  0.9  0.9      There is no height or weight on file to calculate BMI.  Orders:  No orders of the defined types were placed in this encounter.  No orders of the defined types were placed in this encounter.    Procedures: No procedures performed  Clinical Data: No additional findings.  ROS:  All other systems negative, except as noted in the HPI. Review of Systems  Objective: Vital Signs: There were no vitals taken for this visit.  Specialty Comments:  No specialty comments available.  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   PND (post-nasal drip) 07/17/2021   Lower urinary tract symptoms (LUTS) 09/16/2020   Gouty arthritis of left foot 06/20/2020   Diabetic peripheral neuropathy (HCC) 10/04/2019   Vitamin B12 deficiency 05/17/2019   Idiopathic chronic gout, unspecified site, without tophus (tophi) 10/06/2017   Coronary artery disease due to lipid rich plaque    Chronic atrial fibrillation (HCC)    CKD (chronic kidney disease), stage III (HCC)    Hyperlipidemia    Chest pain of uncertain etiology 10/24/2015   ETD (eustachian tube dysfunction) 05/24/2015   Chronic anticoagulation 09/25/2014   Esophageal stricture from GERD 06/08/2011   OSA on CPAP 09/22/2010   Gout 08/15/2008   OBESITY 08/15/2008   Primary hypertension 08/15/2008   Coronary artery disease with exertional angina 08/15/2008   GASTROESOPHAGEAL REFLUX DISEASE 08/15/2008   Past Medical History:   Diagnosis Date   Arthritis    Bradycardia    a. nocturnal with pauses overnight on tele likely due to OSA 10/2015.   CKD (chronic kidney disease), stage III (HCC)    Coronary artery disease    a. prev nonobst. b. LHC 10/24/15: occlusion of small diffusely diseased OM2 s/p balloon angioplasty, mild nonobstructive disease of mLAD and mRCA, normal LVEDP   Diabetes mellitus without complication (HCC)    01/02/21 Pt's daughter states patient does not  have diabetes.  A1C 07/23/20 was 7.2, patientr had been on medication.   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 Wonda & Dr. Maryjane   Severe aortic stenosis    Vertigo     Family History  Problem Relation Age of Onset   Stomach cancer Mother        died in 50's    Hypertension Father        died in his 65's pna likely dementia   Dementia Father    Pneumonia Father    Heart disease Brother        S/P CABG   Sleep apnea Brother    Sleep apnea Daughter    Hypertension Other    Prostate cancer Neg Hx    Colon cancer Neg Hx     Past Surgical History:  Procedure Laterality Date   AMPUTATION Right 03/03/2019   Procedure: RIGHT 2ND TOE AMPUTATION;  Surgeon: Harden Jerona GAILS, MD;  Location: [facility] OR;  Service: Orthopedics;  Laterality: Right;   AMPUTATION Right 01/03/2021   Procedure: RIGHT 3RD TOE AMPUTATION;  Surgeon: Harden Jerona GAILS, MD;  Location: [facility]  OR;  Service: Orthopedics;  Laterality: Right;   BALLOON DILATION N/A 10/22/2014   Procedure: BALLOON DILATION;  Surgeon: Lupita FORBES Commander, MD;  Location: [facility] ENDOSCOPY;  Service: Endoscopy;  Laterality: N/A;   CARDIAC CATHETERIZATION  07/2008   CARDIAC CATHETERIZATION N/A 10/24/2015   Procedure: Left Heart Cath and Coronary Angiography;  Surgeon: Lonni Hanson, MD;  Location: [facility] INVASIVE CV LAB;  Service: Cardiovascular;  Laterality: N/A;   CARDIAC CATHETERIZATION N/A 10/24/2015   Procedure: Coronary Balloon Angioplasty;  Surgeon: Lonni Hanson, MD;  Location: [facility] INVASIVE CV LAB;  Service: Cardiovascular;  Laterality: N/A;   Carotid Dopplers  09/2009   no sig extracranial stenosis and vertebral arteries had antegrade flow   CATARACT EXTRACTION W/ INTRAOCULAR LENS  IMPLANT, BILATERAL Bilateral    CORONARY STENT INTERVENTION N/A 03/11/2022   Procedure: CORONARY STENT INTERVENTION;  Surgeon: Wonda Sharper, MD;  Location: [facility] INVASIVE CV LAB;  Service: Cardiovascular;  Laterality: N/A;   ESOPHAGOGASTRODUODENOSCOPY (EGD) WITH ESOPHAGEAL DILATION     I've had it stretched 3 times (10/23/2015)   ESOPHAGOGASTRODUODENOSCOPY (EGD) WITH ESOPHAGEAL DILATION  06/2011   /medical hx above  (09/09/2017)   ESOPHAGOGASTRODUODENOSCOPY (EGD) WITH PROPOFOL  N/A 10/22/2014   Procedure: ESOPHAGOGASTRODUODENOSCOPY (EGD) WITH PROPOFOL ;  Surgeon: Lupita FORBES Commander, MD;  Location: [facility] ENDOSCOPY;  Service: Endoscopy;  Laterality: N/A;   FINGER SURGERY Right 02/1964   cut little finger off; had it reattached   I & D EXTREMITY Right 09/10/2017   Procedure: RIGHT FOOT DEBRIDEMENT;  Surgeon: Harden Jerona GAILS, MD;  Location: [facility] OR;  Service: Orthopedics;  Laterality: Right;   INTRAOPERATIVE TRANSTHORACIC ECHOCARDIOGRAM N/A 04/14/2022   Procedure: INTRAOPERATIVE TRANSTHORACIC ECHOCARDIOGRAM;  Surgeon: Wonda Sharper, MD;  Location: [facility] INVASIVE CV LAB;  Service: Open Heart Surgery;  Laterality: N/A;   MRI of brain  09/2009   chronic  microvascular ischemia   OSA Sleep Study     per Dr. Chalice  RIGHT/LEFT HEART CATH AND CORONARY ANGIOGRAPHY N/A 03/11/2022   Procedure: RIGHT/LEFT HEART CATH AND CORONARY ANGIOGRAPHY;  Surgeon: Wonda Sharper, MD;  Location: [facility] INVASIVE CV LAB;  Service: Cardiovascular;  Laterality: N/A;   SHOULDER ARTHROSCOPY W/ ROTATOR CUFF REPAIR Right 2008   TRANSCATHETER AORTIC VALVE REPLACEMENT, TRANSFEMORAL Left 04/14/2022   Procedure: Transcatheter Aortic Valve Replacement, Transfemoral;  Surgeon: Wonda Sharper, MD;  Location: [facility] INVASIVE CV LAB;  Service: Open Heart Surgery;  Laterality: Left;   Social History   Occupational History   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  Tobacco Use   Smoking status: Former    Current packs/day: 0.00    Average packs/day: 0.1 packs/day for 4.0 years (0.5 ttl pk-yrs)    Types: Cigarettes    Start date: 03/16/1965    Quit date: 03/16/1969    Years since quitting: 55.1    Passive exposure: Never   Smokeless tobacco: Former   Tobacco comments:    chewed when I smoked; smoked 1 pack/week for about 4 years, quit 1971  Vaping Use   Vaping status: Never Used  Substance and Sexual Activity   Alcohol use: Not Currently    Alcohol/week: 0.0 standard drinks of alcohol    Comment: 09/09/2017  Quit all alcohol in 1970's   Drug use: Never   Sexual activity: Not on file         "

## 2024-05-02 ENCOUNTER — Ambulatory Visit

## 2024-07-18 ENCOUNTER — Ambulatory Visit: Admitting: Orthopedic Surgery

## 2024-08-24 ENCOUNTER — Ambulatory Visit (HOSPITAL_COMMUNITY)

## 2024-09-28 ENCOUNTER — Encounter: Admitting: Family Medicine

## 2024-12-11 ENCOUNTER — Ambulatory Visit

## 2024-12-20 ENCOUNTER — Ambulatory Visit: Admitting: Adult Health

## 2024-12-28 ENCOUNTER — Ambulatory Visit: Admitting: Adult Health
